# Patient Record
Sex: Male | Born: 1950
Health system: Southern US, Community
[De-identification: ages and names within clinical notes are randomized; demographics above are authoritative.]

## PROBLEM LIST (undated history)

## (undated) DIAGNOSIS — R351 Nocturia: Secondary | ICD-10-CM

## (undated) DIAGNOSIS — F419 Anxiety disorder, unspecified: Secondary | ICD-10-CM

## (undated) DIAGNOSIS — I2510 Atherosclerotic heart disease of native coronary artery without angina pectoris: Secondary | ICD-10-CM

## (undated) DIAGNOSIS — E119 Type 2 diabetes mellitus without complications: Secondary | ICD-10-CM

## (undated) DIAGNOSIS — I10 Essential (primary) hypertension: Secondary | ICD-10-CM

## (undated) DIAGNOSIS — Z951 Presence of aortocoronary bypass graft: Secondary | ICD-10-CM

## (undated) DIAGNOSIS — G4733 Obstructive sleep apnea (adult) (pediatric): Secondary | ICD-10-CM

## (undated) DIAGNOSIS — Z973 Presence of spectacles and contact lenses: Secondary | ICD-10-CM

## (undated) DIAGNOSIS — K219 Gastro-esophageal reflux disease without esophagitis: Secondary | ICD-10-CM

## (undated) DIAGNOSIS — E785 Hyperlipidemia, unspecified: Secondary | ICD-10-CM

## (undated) DIAGNOSIS — I219 Acute myocardial infarction, unspecified: Secondary | ICD-10-CM

## (undated) DIAGNOSIS — Z9989 Dependence on other enabling machines and devices: Secondary | ICD-10-CM

## (undated) DIAGNOSIS — Z8711 Personal history of peptic ulcer disease: Secondary | ICD-10-CM

## (undated) DIAGNOSIS — Z87442 Personal history of urinary calculi: Secondary | ICD-10-CM

## (undated) DIAGNOSIS — D373 Neoplasm of uncertain behavior of appendix: Secondary | ICD-10-CM

## (undated) DIAGNOSIS — N281 Cyst of kidney, acquired: Secondary | ICD-10-CM

## (undated) DIAGNOSIS — N201 Calculus of ureter: Secondary | ICD-10-CM

## (undated) DIAGNOSIS — R911 Solitary pulmonary nodule: Secondary | ICD-10-CM

## (undated) DIAGNOSIS — I252 Old myocardial infarction: Secondary | ICD-10-CM

## (undated) DIAGNOSIS — I440 Atrioventricular block, first degree: Secondary | ICD-10-CM

## (undated) DIAGNOSIS — M1990 Unspecified osteoarthritis, unspecified site: Secondary | ICD-10-CM

## (undated) DIAGNOSIS — M109 Gout, unspecified: Secondary | ICD-10-CM

## (undated) DIAGNOSIS — N289 Disorder of kidney and ureter, unspecified: Secondary | ICD-10-CM

## (undated) HISTORY — DX: Essential (primary) hypertension: I10

## (undated) HISTORY — PX: NASAL SEPTUM SURGERY: SHX37

## (undated) HISTORY — PX: TRANSTHORACIC ECHOCARDIOGRAM: SHX275

## (undated) HISTORY — DX: Type 2 diabetes mellitus without complications: E11.9

## (undated) HISTORY — PX: TONSILLECTOMY: SUR1361

## (undated) HISTORY — PX: CARDIOVASCULAR STRESS TEST: SHX262

## (undated) HISTORY — PX: ELBOW BURSA SURGERY: SHX615

## (undated) HISTORY — PX: KNEE ARTHROSCOPY: SUR90

## (undated) HISTORY — DX: Atherosclerotic heart disease of native coronary artery without angina pectoris: I25.10

## (undated) HISTORY — PX: TYMPANOSTOMY TUBE PLACEMENT: SHX32

---

## 2004-04-16 ENCOUNTER — Ambulatory Visit (HOSPITAL_COMMUNITY): Admission: RE | Admit: 2004-04-16 | Discharge: 2004-04-16 | Payer: Self-pay | Admitting: *Deleted

## 2011-08-11 ENCOUNTER — Ambulatory Visit (INDEPENDENT_AMBULATORY_CARE_PROVIDER_SITE_OTHER): Payer: Managed Care, Other (non HMO) | Admitting: Urology

## 2011-08-11 DIAGNOSIS — R3129 Other microscopic hematuria: Secondary | ICD-10-CM

## 2011-08-11 DIAGNOSIS — N529 Male erectile dysfunction, unspecified: Secondary | ICD-10-CM

## 2011-08-11 DIAGNOSIS — K4090 Unilateral inguinal hernia, without obstruction or gangrene, not specified as recurrent: Secondary | ICD-10-CM

## 2013-04-19 ENCOUNTER — Inpatient Hospital Stay (HOSPITAL_COMMUNITY)
Admission: AD | Admit: 2013-04-19 | Discharge: 2013-04-30 | DRG: 234 | Disposition: A | Discharge status: Home / Self Care | Payer: Managed Care, Other (non HMO) | Source: Other Acute Inpatient Hospital | Attending: Surgery | Admitting: Surgery

## 2013-04-19 ENCOUNTER — Encounter (HOSPITAL_COMMUNITY): Payer: Self-pay | Admitting: Physician Assistant

## 2013-04-19 DIAGNOSIS — Z87891 Personal history of nicotine dependence: Secondary | ICD-10-CM

## 2013-04-19 DIAGNOSIS — I4729 Other ventricular tachycardia: Secondary | ICD-10-CM | POA: Diagnosis not present

## 2013-04-19 DIAGNOSIS — I214 Non-ST elevation (NSTEMI) myocardial infarction: Principal | ICD-10-CM | POA: Diagnosis present

## 2013-04-19 DIAGNOSIS — I2510 Atherosclerotic heart disease of native coronary artery without angina pectoris: Secondary | ICD-10-CM

## 2013-04-19 DIAGNOSIS — J9819 Other pulmonary collapse: Secondary | ICD-10-CM | POA: Diagnosis not present

## 2013-04-19 DIAGNOSIS — Z7982 Long term (current) use of aspirin: Secondary | ICD-10-CM

## 2013-04-19 DIAGNOSIS — Z8711 Personal history of peptic ulcer disease: Secondary | ICD-10-CM

## 2013-04-19 DIAGNOSIS — E119 Type 2 diabetes mellitus without complications: Secondary | ICD-10-CM | POA: Diagnosis present

## 2013-04-19 DIAGNOSIS — I472 Ventricular tachycardia, unspecified: Secondary | ICD-10-CM | POA: Diagnosis not present

## 2013-04-19 DIAGNOSIS — D62 Acute posthemorrhagic anemia: Secondary | ICD-10-CM | POA: Diagnosis not present

## 2013-04-19 DIAGNOSIS — M109 Gout, unspecified: Secondary | ICD-10-CM | POA: Diagnosis present

## 2013-04-19 DIAGNOSIS — I498 Other specified cardiac arrhythmias: Secondary | ICD-10-CM | POA: Diagnosis present

## 2013-04-19 DIAGNOSIS — I10 Essential (primary) hypertension: Secondary | ICD-10-CM | POA: Diagnosis present

## 2013-04-19 DIAGNOSIS — Z79899 Other long term (current) drug therapy: Secondary | ICD-10-CM

## 2013-04-19 DIAGNOSIS — G4733 Obstructive sleep apnea (adult) (pediatric): Secondary | ICD-10-CM | POA: Diagnosis present

## 2013-04-19 DIAGNOSIS — Z951 Presence of aortocoronary bypass graft: Secondary | ICD-10-CM

## 2013-04-19 DIAGNOSIS — E8779 Other fluid overload: Secondary | ICD-10-CM | POA: Diagnosis not present

## 2013-04-19 DIAGNOSIS — Z23 Encounter for immunization: Secondary | ICD-10-CM

## 2013-04-19 DIAGNOSIS — K219 Gastro-esophageal reflux disease without esophagitis: Secondary | ICD-10-CM | POA: Diagnosis present

## 2013-04-19 DIAGNOSIS — E1159 Type 2 diabetes mellitus with other circulatory complications: Secondary | ICD-10-CM | POA: Diagnosis present

## 2013-04-19 DIAGNOSIS — R110 Nausea: Secondary | ICD-10-CM | POA: Diagnosis not present

## 2013-04-19 DIAGNOSIS — E785 Hyperlipidemia, unspecified: Secondary | ICD-10-CM | POA: Diagnosis present

## 2013-04-19 HISTORY — DX: Gout, unspecified: M10.9

## 2013-04-19 HISTORY — DX: Obstructive sleep apnea (adult) (pediatric): G47.33

## 2013-04-19 HISTORY — DX: Dependence on other enabling machines and devices: Z99.89

## 2013-04-19 HISTORY — DX: Gastro-esophageal reflux disease without esophagitis: K21.9

## 2013-04-19 LAB — HEPARIN LEVEL (UNFRACTIONATED): Heparin Unfractionated: 0.15 IU/mL — ABNORMAL LOW (ref 0.30–0.70)

## 2013-04-19 LAB — TROPONIN I: Troponin I: 0.34 ng/mL (ref <0.30)

## 2013-04-19 LAB — GLUCOSE, CAPILLARY
Glucose-Capillary: 187 mg/dL — ABNORMAL HIGH (ref 70–99)
Glucose-Capillary: 171 mg/dL — ABNORMAL HIGH (ref 70–99)

## 2013-04-19 MED ORDER — SODIUM CHLORIDE 0.9 % IJ SOLN
3.0000 mL | Freq: Two times a day (BID) | INTRAMUSCULAR | Status: DC
  Administered 2013-04-20 – 2013-04-24 (×5): 3 mL via INTRAVENOUS

## 2013-04-19 MED ORDER — PANTOPRAZOLE SODIUM 40 MG PO TBEC
40.0000 mg | DELAYED_RELEASE_TABLET | Freq: Every day | ORAL | Status: DC
  Administered 2013-04-19 – 2013-04-24 (×6): 40 mg via ORAL
  Filled 2013-04-19 (×6): qty 1

## 2013-04-19 MED ORDER — HEPARIN (PORCINE) IN NACL 100-0.45 UNIT/ML-% IJ SOLN
1700.0000 [IU]/h | INTRAMUSCULAR | Status: DC
  Administered 2013-04-19: 1400 [IU]/h via INTRAVENOUS
  Administered 2013-04-19 – 2013-04-20 (×2): 1700 [IU]/h via INTRAVENOUS
  Filled 2013-04-19 (×3): qty 250

## 2013-04-19 MED ORDER — ZOLPIDEM TARTRATE 5 MG PO TABS: 5.0000 mg | ORAL_TABLET | Freq: Every evening | ORAL | Status: DC | PRN

## 2013-04-19 MED ORDER — NITROGLYCERIN 0.4 MG SL SUBL: 0.4000 mg | SUBLINGUAL_TABLET | SUBLINGUAL | Status: DC | PRN

## 2013-04-19 MED ORDER — ASPIRIN EC 81 MG PO TBEC
81.0000 mg | DELAYED_RELEASE_TABLET | Freq: Every day | ORAL | Status: DC
  Administered 2013-04-19 – 2013-04-24 (×5): 81 mg via ORAL
  Filled 2013-04-19 (×7): qty 1

## 2013-04-19 MED ORDER — ACETAMINOPHEN 325 MG PO TABS
650.0000 mg | ORAL_TABLET | ORAL | Status: DC | PRN
  Administered 2013-04-22 – 2013-04-25 (×3): 650 mg via ORAL
  Filled 2013-04-19 (×4): qty 2

## 2013-04-19 MED ORDER — HEPARIN BOLUS VIA INFUSION
3000.0000 [IU] | Freq: Once | INTRAVENOUS | Status: AC
  Administered 2013-04-19: 3000 [IU] via INTRAVENOUS
  Filled 2013-04-19: qty 3000

## 2013-04-19 MED ORDER — SODIUM CHLORIDE 0.9 % IJ SOLN: 3.0000 mL | INTRAMUSCULAR | Status: DC | PRN

## 2013-04-19 MED ORDER — ADULT MULTIVITAMIN W/MINERALS CH
1.0000 | ORAL_TABLET | Freq: Every day | ORAL | Status: DC
  Administered 2013-04-19 – 2013-04-30 (×11): 1 via ORAL
  Filled 2013-04-19 (×12): qty 1

## 2013-04-19 MED ORDER — ALPRAZOLAM 0.25 MG PO TABS: 0.2500 mg | ORAL_TABLET | Freq: Two times a day (BID) | ORAL | Status: DC | PRN

## 2013-04-19 MED ORDER — ONDANSETRON HCL 4 MG/2ML IJ SOLN
4.0000 mg | Freq: Four times a day (QID) | INTRAMUSCULAR | Status: DC | PRN
  Administered 2013-04-24: 4 mg via INTRAVENOUS
  Filled 2013-04-19: qty 2

## 2013-04-19 MED ORDER — PNEUMOCOCCAL VAC POLYVALENT 25 MCG/0.5ML IJ INJ
0.5000 mL | INJECTION | INTRAMUSCULAR | Status: AC
  Filled 2013-04-19: qty 0.5

## 2013-04-19 MED ORDER — INSULIN ASPART 100 UNIT/ML ~~LOC~~ SOLN
0.0000 [IU] | Freq: Three times a day (TID) | SUBCUTANEOUS | Status: DC
  Administered 2013-04-19: 3 [IU] via SUBCUTANEOUS
  Administered 2013-04-20: 2 [IU] via SUBCUTANEOUS
  Administered 2013-04-20: 3 [IU] via SUBCUTANEOUS
  Administered 2013-04-21: 2 [IU] via SUBCUTANEOUS
  Administered 2013-04-21: 3 [IU] via SUBCUTANEOUS
  Administered 2013-04-21 – 2013-04-23 (×4): 2 [IU] via SUBCUTANEOUS
  Administered 2013-04-23: 3 [IU] via SUBCUTANEOUS
  Administered 2013-04-23: 8 [IU] via SUBCUTANEOUS
  Administered 2013-04-24: 11 [IU] via SUBCUTANEOUS
  Administered 2013-04-24: 3 [IU] via SUBCUTANEOUS
  Administered 2013-04-24: 11 [IU] via SUBCUTANEOUS

## 2013-04-19 MED ORDER — ATORVASTATIN CALCIUM 80 MG PO TABS
80.0000 mg | ORAL_TABLET | Freq: Every day | ORAL | Status: DC
  Administered 2013-04-19 – 2013-04-29 (×9): 80 mg via ORAL
  Filled 2013-04-19 (×12): qty 1

## 2013-04-19 MED ORDER — INSULIN ASPART 100 UNIT/ML ~~LOC~~ SOLN
0.0000 [IU] | Freq: Every day | SUBCUTANEOUS | Status: DC
  Administered 2013-04-23: 2 [IU] via SUBCUTANEOUS

## 2013-04-19 MED ORDER — SODIUM CHLORIDE 0.9 % IV SOLN
250.0000 mL | INTRAVENOUS | Status: DC | PRN
  Administered 2013-04-22: 250 mL via INTRAVENOUS
  Administered 2013-04-25: 11:00:00 via INTRAVENOUS

## 2013-04-19 MED FILL — Heparin Sodium (Porcine) 100 Unt/ML in Sodium Chloride 0.45%: INTRAMUSCULAR | Qty: 250 | Status: AC

## 2013-04-19 NOTE — Progress Notes (Signed)
Call placed to Dr. Chase Picket, informed of patient troponin level of 0.34, patient denies chest pain , n/v. Skin warm and dry, resp even and regular. No new order at this time.

## 2013-04-19 NOTE — H&P (Signed)
History and Physical   Patient ID: [name] [name] MRN: [id number], DOB/AGE: 03/19/1951 62 y.o. Date of Encounter: 04/19/2013  Primary Physician: Dr. Selinda Flavin Primary Cardiologist: New  Chief Complaint:  Chest pain  HPI: [name] [name] is a 62 y.o. male with no history of CAD.    He has a 3 week history exertional chest pain. It has come on with exertion or stress. The first episode was associated with nausea and dry heaves. Other episodes just with nausea. No SOB or diaphoresis. On 10/7 he had an episode that was the worst so far, reaching a 3-4/10 and associated with a lot of nausea. The pain is substernal and a pressure. It will last < 10 minutes if he stops and relaxes. Yesterday, the episode began after he had been shoveling hard for 30 minutes. The severity made him go to [place]. He was admitted there and transferred to [facility] when enzymes were elevated.   He is currently pain free. He admits to NOT following any diet for his diabetes.    Past Medical History  Diagnosis Date  . Diabetes   . HTN (hypertension)   . OSA on CPAP   . PUD (peptic ulcer disease) 1990s  . GERD (gastroesophageal reflux disease)     Surgical History:  Past Surgical History  Procedure Laterality Date  . Nasal septum surgery  > 20 yr ago     I have reviewed the patient's current medications. Prior to Admission medications   Not on File   Scheduled Meds: . atorvastatin  80 mg Oral q1800  . sodium chloride  3 mL Intravenous Q12H   Continuous Infusions: . heparin 1,400 Units/hr (04/19/13 1305)   PRN Meds:.sodium chloride, acetaminophen, ALPRAZolam, nitroGLYCERIN, ondansetron (ZOFRAN) IV, sodium chloride, zolpidem  Allergies: No Known Allergies  History   Social History  . Marital Status: Married    Spouse Name: N/A    Number of Children: N/A  . Years of Education: N/A   Occupational History  . Drives truck Runner, broadcasting/film/video   Social History Main Topics  . Smoking  status: Former Games developer  . Smokeless tobacco: Not on file  . Alcohol Use: No  . Drug Use: No  . Sexual Activity: Not on file   Other Topics Concern  . Not on file   Social History Narrative   Lives with wife, does not exercise but part of his job is strenuous. Works in a body-shop part-time, too.   Mother's sister died suddenly (?MI but no autopsy). Mother's brother died of a heart attack.    Family Status  Relation Status Death Age  . Mother Alive Born [phone]    Heart rhythm problems, no CAD  . Father Alive Born [phone]    No CAD    Review of Systems:   Full 14-point review of systems otherwise negative except as noted above.  Physical Exam: Blood pressure 137/78, pulse 51, temperature 97.9 F (36.6 C), resp. rate 18, height 6\' 1"  (1.854 m), weight 230 lb (104.327 kg), SpO2 98.00%. General: Well developed, well nourished,male in no acute distress. Head: Normocephalic, atraumatic, sclera non-icteric, no xanthomas, nares are without discharge. Dentition:  Neck: No carotid bruits. JVD not elevated. No thyromegally Lungs: Good expansion bilaterally. without wheezes or rhonchi.  Heart: Regular rate and rhythm with S1 S2.  No S3 or S4.  No murmur, no rubs, or gallops appreciated. Abdomen: Soft, non-tender, non-distended with normoactive bowel sounds. No hepatomegaly. No rebound/guarding. No obvious abdominal  masses. Msk:  Strength and tone appear normal for age. No joint deformities or effusions, no spine or costo-vertebral angle tenderness. Extremities: No clubbing or cyanosis. No edema.  Distal pedal pulses are 2+ in 4 extrem Neuro: Alert and oriented X 3. Moves all extremities spontaneously. No focal deficits noted. Psych:  Responds to questions appropriately with a normal affect. Skin: No rashes or lesions noted  Labs:  LABS - DONE AT [facility]    Date  NA  132   K+ 3.5   CL 101   CO2 23   BUN 21   CR 1.38   GLU 270       WBC 10.0   HGB 14.1   HCT 40.8   MCV 90.8   PLT 139        CK 146   CKMB 5.2   INDEX 3.5   TROP 0.06      1990s  PT/INR 12.7/1.0   PTT 30.3         HGBA1c 8.2      Radiology/Studies: AT [facility] - NAD  ECG: SBRADY 48, Q Waves III, AVF  ASSESSMENT AND PLAN:  Principal Problem:   Non-ST elevation myocardial infarction (NSTEMI), initial episode of care - Pain-free now, so continue current therapy with ASA/Heparin/statin. No BB secondary to bradycardia. Use nitrates PRN. Continue to cycle troponins and follow trend. Cath indicated, will recheck BMET and hold metformin. Cath tomorrow morning.   Will not load with Plavix since he has poorly controlled diabetes and may need CABG.  Check echo.  Otherwise, continue home Rx, add SSI, hold lisinopril. Continue PPI/CPAP. Active Problems:   Diabetes   HTN (hypertension)   OSA on CPAP   GERD (gastroesophageal reflux disease)   Signed, Theodore Demark, PA-C 04/19/2013 1:47 PM Beeper [phone]  Attending Note:   The patient was seen and examined.  Agree with assessment and plan as noted above.  Changes made to the above note as needed.  Pt is stable today.  Will set him up for cath tomorrow.    He needs to follow a better diet for his DM.    Vesta Mixer, Montez Hageman., MD, [facility] 04/19/2013, 2:19 PM

## 2013-04-19 NOTE — Plan of Care (Signed)
Problem: Food- and Nutrition-Related Knowledge Deficit (NB-1.1) Goal: Nutrition education Formal process to instruct or train a patient/client in a skill or to impart knowledge to help patients/clients voluntarily manage or modify food choices and eating behavior to maintain or improve health. Outcome: Progressing  RD consulted for nutrition education regarding diabetes.   HgBA1C: 8.2  RD provided "Carbohydrate Counting for People with Diabetes" handout from the Academy of Nutrition and Dietetics. Discussed different food groups and their effects on blood sugar, emphasizing carbohydrate-containing foods. Provided list of carbohydrates and recommended serving sizes of common foods.  Discussed importance of controlled and consistent carbohydrate intake throughout the day. Provided examples of ways to balance meals/snacks and encouraged intake of high-fiber, whole grain complex carbohydrates.  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  Pt and family describe "will power" related to love of buffets, and reliance on medication to treat blood sugars, and 2 largest barriers to diet change.  Teach back method used.  Expect good compliance.  Body mass index is 30.35 kg/(m^2). Pt meets criteria for obese based on current BMI.  Current diet order is CHO Mod Medium, patient is consuming approximately 50% of meals at this time. Labs and medications reviewed. No further nutrition interventions warranted at this time. RD contact information provided. If additional nutrition issues arise, please re-consult RD.  Loyce Dys, MS RD LDN Clinical Inpatient Dietitian Pager: [phone] Weekend/After hours pager: [phone]

## 2013-04-19 NOTE — Care Management Note (Unsigned)
    Page 1 of 1   04/24/2013     10:54:08 AM   CARE MANAGEMENT NOTE 04/24/2013  Patient:  [name] [name], [name] [name]   Account Number:  [id number]  Date Initiated:  04/19/2013  Documentation initiated by:  GRAVES-BIGELOW,Dario Yono  Subjective/Objective Assessment:   Pt admitted as a tx from [facility] for Nstemi. Plan for cath 04-20-13.     Action/Plan:   CM will continue to monitor for disposition needs.   Anticipated DC Date:  04/20/2013   Anticipated DC Plan:  HOME/SELF CARE      DC Planning Services  CM consult      Choice offered to / List presented to:             Status of service:  In process, will continue to follow Medicare Important Message given?   (If response is "NO", the following Medicare IM given date fields will be blank) Date Medicare IM given:   Date Additional Medicare IM given:    Discharge Disposition:    Per UR Regulation:  Reviewed for med. necessity/level of care/duration of stay  If discussed at Long Length of Stay Meetings, dates discussed:   04/25/2013    Comments:  04-24-13 Tomi Bamberger, RN,BSN [phone] Plan for CABG Tuesday. CM will continue to monitor for disposition needs post procedure.

## 2013-04-19 NOTE — Progress Notes (Signed)
ANTICOAGULATION CONSULT NOTE - Initial Consult  Pharmacy Consult for Heparin Indication: NSTEMI  Allergies not on file  Patient Measurements: Height: 6\' 1"  (185.4 cm) Weight: 230 lb (104.327 kg) IBW/kg (Calculated) : 79.9 Heparin Dosing Weight: 102 kg  Vital Signs: Temp: 97.9 F (36.6 C) (10/08 1214) BP: 137/78 mmHg (10/08 1214) Pulse Rate: 51 (10/08 1214)  Labs: No results found for this basename: HGB, HCT, PLT, APTT, LABPROT, INR, HEPARINUNFRC, CREATININE, CKTOTAL, CKMB, TROPONINI,  in the last 72 hours  CrCl is unknown because no creatinine reading has been taken.   Medical History: OSA (CPAP at night), HTN, NIDDM 2  Medications:  See electronic med rec  Assessment: 62 y.o. male presents from [facility] hospital with NSTEMI. Pt received heparin at [facility] 5000 unit bolus and gtt at 1400 units/hr starting at 0200. RN changing heparin bag from [facility] to ours right now - heparin off less than 10 min.  Labs at [facility] 10/7: Trop 0.06, Na 123, K 3.5, Cl 101, Bicarb 23, SCr 1.38, BUN 21, gluc 270, WBC 10, Hgb 14.1, Hct 40.8, plt 139  Goal of Therapy:  Heparin level 0.3-0.7 units/ml Monitor platelets by anticoagulation protocol: Yes   Plan:  1. Continue heparin gtt at 1400 units/hr 2. Will f/u heparin level now 3. Daily heparin level and CBC  Christoper Fabian, PharmD, BCPS Clinical pharmacist, pager [phone] 04/19/2013,12:58 PM

## 2013-04-19 NOTE — Progress Notes (Signed)
ANTICOAGULATION CONSULT NOTE - Follow-up Consult  Pharmacy Consult for Heparin Indication: NSTEMI  No Known Allergies  Patient Measurements: Height: 6\' 1"  (185.4 cm) Weight: 230 lb (104.327 kg) IBW/kg (Calculated) : 79.9 Heparin Dosing Weight: 102 kg  Vital Signs: Temp: 97.9 F (36.6 C) (10/08 1214) BP: 137/78 mmHg (10/08 1214) Pulse Rate: 51 (10/08 1214)  Labs:  Recent Labs  04/19/13 1410  HEPARINUNFRC 0.15*    CrCl is unknown because no creatinine reading has been taken.   Medical History: OSA (CPAP at night), HTN, NIDDM 2  Medications:  See electronic med rec  Assessment: 62 y.o. male presented from [facility] hospital with NSTEMI. Pt received heparin at [facility] 5000 unit bolus and gtt at 1400 units/hr starting at 0200. RN changed heparin bag from [facility] to ours 1300 - heparin off less than 10 min.  Heparin level 0.15 (subtherapeutic). No bleeding noted. Still awaiting other labs to result here.  Labs at [facility] 10/7: Trop 0.06, Na 123, K 3.5, Cl 101, Bicarb 23, SCr 1.38, BUN 21, gluc 270, WBC 10, Hgb 14.1, Hct 40.8, plt 139, INR 1  Goal of Therapy:  Heparin level 0.3-0.7 units/ml Monitor platelets by anticoagulation protocol: Yes   Plan:  1. Rebolus heparin 3000 units 2. Increase heparin gtt to 1700 units/hr 3. Will f/u heparin level in 6 hours 4. Daily heparin level and CBC  Christoper Fabian, PharmD, BCPS Clinical pharmacist, pager [phone] 04/19/2013,3:18 PM

## 2013-04-19 NOTE — Progress Notes (Signed)
UR Completed Keyasia Jolliff Graves-Bigelow, RN,BSN [phone]  

## 2013-04-20 ENCOUNTER — Other Ambulatory Visit: Payer: Self-pay

## 2013-04-20 ENCOUNTER — Encounter (HOSPITAL_COMMUNITY)
Admission: AD | Disposition: A | Discharge status: Home / Self Care | Payer: Self-pay | Source: Other Acute Inpatient Hospital | Attending: Cardiovascular Disease

## 2013-04-20 ENCOUNTER — Inpatient Hospital Stay (HOSPITAL_COMMUNITY): Payer: Managed Care, Other (non HMO)

## 2013-04-20 DIAGNOSIS — I2510 Atherosclerotic heart disease of native coronary artery without angina pectoris: Secondary | ICD-10-CM

## 2013-04-20 DIAGNOSIS — Z01810 Encounter for preprocedural cardiovascular examination: Secondary | ICD-10-CM

## 2013-04-20 DIAGNOSIS — I517 Cardiomegaly: Secondary | ICD-10-CM

## 2013-04-20 HISTORY — PX: LEFT HEART CATHETERIZATION WITH CORONARY ANGIOGRAM: SHX5451

## 2013-04-20 LAB — GLUCOSE, CAPILLARY
Glucose-Capillary: 150 mg/dL — ABNORMAL HIGH (ref 70–99)
Glucose-Capillary: 180 mg/dL — ABNORMAL HIGH (ref 70–99)
Glucose-Capillary: 134 mg/dL — ABNORMAL HIGH (ref 70–99)

## 2013-04-20 LAB — COMPREHENSIVE METABOLIC PANEL
AST: 18 U/L (ref 0–37)
Albumin: 3.5 g/dL (ref 3.5–5.2)
Alkaline Phosphatase: 79 U/L (ref 39–117)
Calcium: 9.3 mg/dL (ref 8.4–10.5)
Chloride: 102 mEq/L (ref 96–112)
GFR calc Af Amer: 73 mL/min — ABNORMAL LOW (ref >90)
Potassium: 4.1 mEq/L (ref 3.5–5.1)
Total Bilirubin: 0.9 mg/dL (ref 0.3–1.2)
Total Protein: 6.5 g/dL (ref 6.0–8.3)

## 2013-04-20 LAB — LIPID PANEL
HDL: 29 mg/dL — ABNORMAL LOW (ref >39)
LDL Cholesterol: 82 mg/dL (ref 0–99)
VLDL: 65 mg/dL — ABNORMAL HIGH (ref 0–40)

## 2013-04-20 LAB — CBC
HCT: 41 % (ref 39.0–52.0)
Platelets: 155 10*3/uL (ref 150–400)
RBC: 4.64 MIL/uL (ref 4.22–5.81)
WBC: 7.9 10*3/uL (ref 4.0–10.5)

## 2013-04-20 LAB — SURGICAL PCR SCREEN: Staphylococcus aureus: POSITIVE — AB

## 2013-04-20 LAB — TROPONIN I: Troponin I: 0.31 ng/mL (ref <0.30)

## 2013-04-20 LAB — HEPARIN LEVEL (UNFRACTIONATED): Heparin Unfractionated: 0.32 IU/mL (ref 0.30–0.70)

## 2013-04-20 SURGERY — LEFT HEART CATHETERIZATION WITH CORONARY ANGIOGRAM
Anesthesia: LOCAL

## 2013-04-20 MED ORDER — VERAPAMIL HCL 2.5 MG/ML IV SOLN
INTRAVENOUS | Status: AC
  Filled 2013-04-20: qty 2

## 2013-04-20 MED ORDER — FENTANYL CITRATE 0.05 MG/ML IJ SOLN
INTRAMUSCULAR | Status: AC
  Filled 2013-04-20: qty 2

## 2013-04-20 MED ORDER — PNEUMOCOCCAL VAC POLYVALENT 25 MCG/0.5ML IJ INJ: 0.5000 mL | INJECTION | INTRAMUSCULAR | Status: DC | PRN

## 2013-04-20 MED ORDER — SODIUM CHLORIDE 0.9 % IV SOLN: 1.0000 mL/kg/h | INTRAVENOUS | Status: DC

## 2013-04-20 MED ORDER — HEPARIN (PORCINE) IN NACL 100-0.45 UNIT/ML-% IJ SOLN
1900.0000 [IU]/h | INTRAMUSCULAR | Status: DC
  Administered 2013-04-20 – 2013-04-21 (×3): 1700 [IU]/h via INTRAVENOUS
  Administered 2013-04-22: 1900 [IU]/h via INTRAVENOUS
  Administered 2013-04-22: 1800 [IU]/h via INTRAVENOUS
  Administered 2013-04-23 – 2013-04-24 (×3): 1900 [IU]/h via INTRAVENOUS
  Filled 2013-04-20 (×13): qty 250

## 2013-04-20 MED ORDER — DIAZEPAM 5 MG PO TABS
5.0000 mg | ORAL_TABLET | ORAL | Status: AC
  Administered 2013-04-20: 5 mg via ORAL
  Filled 2013-04-20: qty 1

## 2013-04-20 MED ORDER — SODIUM CHLORIDE 0.9 % IV SOLN: 250.0000 mL | INTRAVENOUS | Status: DC | PRN

## 2013-04-20 MED ORDER — SODIUM CHLORIDE 0.9 % IV SOLN
1.0000 mL/kg/h | INTRAVENOUS | Status: DC
  Administered 2013-04-20: 1 mL/kg/h via INTRAVENOUS

## 2013-04-20 MED ORDER — ASPIRIN 81 MG PO CHEW
324.0000 mg | CHEWABLE_TABLET | ORAL | Status: DC
  Filled 2013-04-20: qty 4

## 2013-04-20 MED ORDER — LIDOCAINE HCL (PF) 1 % IJ SOLN
INTRAMUSCULAR | Status: AC
  Filled 2013-04-20: qty 30

## 2013-04-20 MED ORDER — SODIUM CHLORIDE 0.9 % IJ SOLN: 3.0000 mL | Freq: Two times a day (BID) | INTRAMUSCULAR | Status: DC

## 2013-04-20 MED ORDER — HEPARIN (PORCINE) IN NACL 2-0.9 UNIT/ML-% IJ SOLN
INTRAMUSCULAR | Status: AC
  Filled 2013-04-20: qty 1500

## 2013-04-20 MED ORDER — OXYCODONE-ACETAMINOPHEN 5-325 MG PO TABS
1.0000 | ORAL_TABLET | ORAL | Status: DC | PRN
  Administered 2013-04-23 (×2): 1 via ORAL
  Administered 2013-04-24: 2 via ORAL
  Administered 2013-04-24: 1 via ORAL
  Filled 2013-04-20: qty 2
  Filled 2013-04-20 (×3): qty 1

## 2013-04-20 MED ORDER — ASPIRIN 81 MG PO CHEW
324.0000 mg | CHEWABLE_TABLET | ORAL | Status: AC
  Administered 2013-04-20: 324 mg via ORAL

## 2013-04-20 MED ORDER — HEPARIN SODIUM (PORCINE) 1000 UNIT/ML IJ SOLN
INTRAMUSCULAR | Status: AC
  Filled 2013-04-20: qty 1

## 2013-04-20 MED ORDER — NITROGLYCERIN 0.2 MG/ML ON CALL CATH LAB
INTRAVENOUS | Status: AC
  Filled 2013-04-20: qty 1

## 2013-04-20 MED ORDER — SODIUM CHLORIDE 0.9 % IJ SOLN: 3.0000 mL | INTRAMUSCULAR | Status: DC | PRN

## 2013-04-20 MED ORDER — MIDAZOLAM HCL 2 MG/2ML IJ SOLN
INTRAMUSCULAR | Status: AC
  Filled 2013-04-20: qty 2

## 2013-04-20 NOTE — Progress Notes (Signed)
  Echocardiogram 2D Echocardiogram has been performed.  [name] [name] [name] [name] 04/20/2013, 10:08 AM

## 2013-04-20 NOTE — Consult Note (Signed)
[address]       [place] [address]             [phone]        Reason for Consult: Severe, multi-vessel coronary artery disease Referring Physician: Dr. Tonny Bollman  [name] [name] is an 62 y.o. male.  HPI:   The patient has a 3 week history of exertional chest discomfort associated with nausea and dry heaves. He had a bad episode two days ago and again yesterday after shoveling for 30 minutes prompting him to go to [place]. He ruled in for NSTEMI and was transferred to [facility]. Cath today shows severe 3-vessel disease as noted below. He has had no rest symptoms.   Past Medical History  Diagnosis Date  . Diabetes   . HTN (hypertension)   . OSA on CPAP   . PUD (peptic ulcer disease) 1990s  . GERD (gastroesophageal reflux disease)   . Anginal pain 04/18/2013  . NSTEMI (non-ST elevated myocardial infarction) 04/18/2013    Past Surgical History  Procedure Laterality Date  . Nasal septum surgery  > 20 yr ago    History reviewed. No pertinent family history.  Social History:  reports that he has never smoked. He has never used smokeless tobacco. He reports that he does not drink alcohol or use illicit drugs.  Allergies: No Known Allergies  Medications:  I have reviewed the patient's current medications. Prior to Admission:  Prescriptions prior to admission  Medication Sig Dispense Refill  . aspirin EC 81 MG tablet Take 81 mg by mouth daily.      Marland Kitchen lisinopril (PRINIVIL,ZESTRIL) 10 MG tablet Take 10 mg by mouth daily.      . metFORMIN (GLUCOPHAGE) 500 MG tablet Take 500 mg by mouth daily.      . Multiple Vitamin (MULTIVITAMIN WITH MINERALS) TABS tablet Take 1 tablet by mouth daily.      Marland Kitchen omeprazole (PRILOSEC) 20 MG capsule Take 20 mg by mouth daily.       Scheduled: . aspirin EC  81 mg Oral Daily  . atorvastatin  80 mg Oral q1800  . insulin aspart  0-15 Units Subcutaneous TID WC  . insulin aspart  0-5 Units Subcutaneous QHS  .  multivitamin with minerals  1 tablet Oral Daily  . pantoprazole  40 mg Oral Daily  . pneumococcal 23 valent vaccine  0.5 mL Intramuscular Tomorrow-1000  . sodium chloride  3 mL Intravenous Q12H   Continuous: . heparin 1,700 Units/hr (04/20/13 1513)   BJY:NWGNFA chloride, acetaminophen, ALPRAZolam, nitroGLYCERIN, ondansetron (ZOFRAN) IV, oxyCODONE-acetaminophen, [START ON 04/25/2013] pneumococcal 23 valent vaccine, sodium chloride, zolpidem  Results for orders placed during the hospital encounter of 04/19/13 (from the past 48 hour(s))  HEPARIN LEVEL (UNFRACTIONATED)     Status: Abnormal   Collection Time    04/19/13  2:10 PM      Result Value Range   Heparin Unfractionated 0.15 (*) 0.30 - 0.70 IU/mL   Comment:            IF HEPARIN RESULTS ARE BELOW     EXPECTED VALUES, AND PATIENT     DOSAGE HAS BEEN CONFIRMED,     SUGGEST FOLLOW UP TESTING     OF ANTITHROMBIN III LEVELS.  GLUCOSE, CAPILLARY     Status: Abnormal   Collection Time    04/19/13  4:28 PM      Result Value Range   Glucose-Capillary 187 (*) 70 - 99 mg/dL  TROPONIN I     Status: Abnormal   Collection Time    04/19/13  6:24 PM      Result Value Range   Troponin I 0.34 (*) <0.30 ng/mL   Comment:            Due to the release kinetics of cTnI,     a negative result within the first hours     of the onset of symptoms does not rule out     myocardial infarction with certainty.     If myocardial infarction is still suspected,     repeat the test at appropriate intervals.     REPEATED TO VERIFY     CRITICAL RESULT CALLED TO, READ BACK BY AND VERIFIED WITH:     V [facility] 2032 04/19/13 D BRADLEY  GLUCOSE, CAPILLARY     Status: Abnormal   Collection Time    04/19/13 11:11 PM      Result Value Range   Glucose-Capillary 171 (*) 70 - 99 mg/dL  HEPARIN LEVEL (UNFRACTIONATED)     Status: None   Collection Time    04/19/13 11:42 PM      Result Value Range   Heparin Unfractionated 0.51  0.30 - 0.70 IU/mL   Comment:             IF HEPARIN RESULTS ARE BELOW     EXPECTED VALUES, AND PATIENT     DOSAGE HAS BEEN CONFIRMED,     SUGGEST FOLLOW UP TESTING     OF ANTITHROMBIN III LEVELS.  TROPONIN I     Status: Abnormal   Collection Time    04/20/13  5:15 AM      Result Value Range   Troponin I 0.31 (*) <0.30 ng/mL   Comment:            Due to the release kinetics of cTnI,     a negative result within the first hours     of the onset of symptoms does not rule out     myocardial infarction with certainty.     If myocardial infarction is still suspected,     repeat the test at appropriate intervals.     REPEATED TO VERIFY     CRITICAL VALUE NOTED.  VALUE IS CONSISTENT WITH PREVIOUSLY REPORTED AND CALLED VALUE.  COMPREHENSIVE METABOLIC PANEL     Status: Abnormal   Collection Time    04/20/13  5:15 AM      Result Value Range   Sodium 137  135 - 145 mEq/L   Potassium 4.1  3.5 - 5.1 mEq/L   Chloride 102  96 - 112 mEq/L   CO2 25  19 - 32 mEq/L   Glucose, Bld 177 (*) 70 - 99 mg/dL   BUN 18  6 - 23 mg/dL   Creatinine, Ser 1.47  0.50 - 1.35 mg/dL   Calcium 9.3  8.4 - 82.9 mg/dL   Total Protein 6.5  6.0 - 8.3 g/dL   Albumin 3.5  3.5 - 5.2 g/dL   AST 18  0 - 37 U/L   ALT 24  0 - 53 U/L   Alkaline Phosphatase 79  39 - 117 U/L   Total Bilirubin 0.9  0.3 - 1.2 mg/dL   GFR calc non Af Amer 63 (*) >90 mL/min   GFR calc Af Amer 73 (*) >90 mL/min   Comment: (NOTE)     The eGFR has been calculated using the CKD EPI equation.  This calculation has not been validated in all clinical situations.     eGFR's persistently <90 mL/min signify possible Chronic Kidney     Disease.  LIPID PANEL     Status: Abnormal   Collection Time    04/20/13  5:15 AM      Result Value Range   Cholesterol 176  0 - 200 mg/dL   Triglycerides 161 (*) <150 mg/dL   HDL 29 (*) >09 mg/dL   Total CHOL/HDL Ratio 6.1     VLDL 65 (*) 0 - 40 mg/dL   LDL Cholesterol 82  0 - 99 mg/dL   Comment:            Total Cholesterol/HDL:CHD Risk      Coronary Heart Disease Risk Table                         Men   Women      1/2 Average Risk   3.4   3.3      Average Risk       5.0   4.4      2 X Average Risk   9.6   7.1      3 X Average Risk  23.4   11.0                Use the calculated Patient Ratio     above and the CHD Risk Table     to determine the patient's CHD Risk.                ATP III CLASSIFICATION (LDL):      <100     mg/dL   Optimal      604-540  mg/dL   Near or Above                        Optimal      130-159  mg/dL   Borderline      981-191  mg/dL   High      >478     mg/dL   Very High  HEPARIN LEVEL (UNFRACTIONATED)     Status: None   Collection Time    04/20/13  5:15 AM      Result Value Range   Heparin Unfractionated 0.52  0.30 - 0.70 IU/mL   Comment:            IF HEPARIN RESULTS ARE BELOW     EXPECTED VALUES, AND PATIENT     DOSAGE HAS BEEN CONFIRMED,     SUGGEST FOLLOW UP TESTING     OF ANTITHROMBIN III LEVELS.  CBC     Status: Abnormal   Collection Time    04/20/13  5:15 AM      Result Value Range   WBC 7.9  4.0 - 10.5 K/uL   RBC 4.64  4.22 - 5.81 MIL/uL   Hemoglobin 15.1  13.0 - 17.0 g/dL   HCT 29.5  62.1 - 30.8 %   MCV 88.4  78.0 - 100.0 fL   MCH 32.5  26.0 - 34.0 pg   MCHC 36.8 (*) 30.0 - 36.0 g/dL   RDW 65.7  84.6 - 96.2 %   Platelets 155  150 - 400 K/uL  GLUCOSE, CAPILLARY     Status: Abnormal   Collection Time    04/20/13  8:54 AM      Result Value Range   Glucose-Capillary 150 (*) 70 -  99 mg/dL  GLUCOSE, CAPILLARY     Status: Abnormal   Collection Time    04/20/13  1:50 PM      Result Value Range   Glucose-Capillary 180 (*) 70 - 99 mg/dL    No results found.  Review of Systems  Constitutional: Negative for fever, chills and weight loss.       Fatigue and tiredness for several months  HENT: Negative.   Eyes: Negative.   Respiratory: Negative for cough, sputum production and shortness of breath.   Cardiovascular: Positive for chest pain. Negative for orthopnea,  claudication, leg swelling and PND.  Gastrointestinal: Positive for nausea and vomiting. Negative for abdominal pain.  Genitourinary: Negative.   Musculoskeletal: Negative.   Skin: Negative.   Neurological: Negative.   Endo/Heme/Allergies: Negative.   Psychiatric/Behavioral: Negative.    Blood pressure 128/71, pulse 54, temperature 97.5 F (36.4 C), temperature source Oral, resp. rate 18, height 6\' 1"  (1.854 m), weight 103.375 kg (227 lb 14.4 oz), SpO2 100.00%. Physical Exam  Constitutional: He is oriented to person, place, and time. He appears well-developed and well-nourished. No distress.  HENT:  Head: Normocephalic and atraumatic.  Mouth/Throat: Oropharynx is clear and moist.  Eyes: EOM are normal. Pupils are equal, round, and reactive to light.  Neck: Normal range of motion. Neck supple. No JVD present. No thyromegaly present.  No bruits  Cardiovascular: Normal rate, regular rhythm, normal heart sounds and intact distal pulses.  Exam reveals no gallop and no friction rub.   No murmur heard. Respiratory: Effort normal and breath sounds normal. No respiratory distress. He has no rales.  GI: Soft. Bowel sounds are normal. He exhibits no distension and no mass. There is no tenderness.  Musculoskeletal: He exhibits no edema.  Lymphadenopathy:    He has no cervical adenopathy.  Neurological: He is alert and oriented to person, place, and time. No cranial nerve deficit.  Skin: Skin is warm and dry.  Psychiatric: He has a normal mood and affect.   Cardiac Catheterization Procedure Note  Name: TOBIE HELLEN  MRN: [id number]  DOB: 26-Nov-1950  Procedure: Left Heart Cath, Selective Coronary Angiography, LV angiography  Indication: Non-ST elevation MI. 62 year old gentleman with long-standing diabetes presents with crescendo angina and elevated cardiac enzymes. He has no personal history of CAD. He was referred for cardiac catheterization and possible PCI.  Procedural Details: The right  wrist was prepped, draped, and anesthetized with 1% lidocaine. Using the modified Seldinger technique, a 5 French sheath was introduced into the right radial artery. 3 mg of verapamil was administered through the sheath, weight-based unfractionated heparin was administered intravenously. Standard Judkins catheters were used for selective coronary angiography and left ventriculography. Catheter exchanges were performed over an exchange length guidewire. There were no immediate procedural complications. A TR band was used for radial hemostasis at the completion of the procedure. The patient was transferred to the post catheterization recovery area for further monitoring.  Procedural Findings:  Hemodynamics:  AO 122/69 with a mean of 92  LV 119/14  Coronary angiography:  Coronary dominance: right  Left mainstem: The left main is patent with no obstructive disease present. The vessel arises from the left cusp. It divides into the LAD and left circumflex.  Left anterior descending (LAD): The LAD is severely diseased. The proximal LAD has 90% stenosis involving the origin of large first diagonal branch. The mid LAD has 90% stenosis involving the origin of the medium caliber second diagonal branch. The LAD wraps around the left  ventricular apex. The first diagonal supplies a large territory. There is 80% ostial stenosis. The second diagonal supplies a medium territory and there is 50% ostial stenosis.  Left circumflex (LCx): The left circumflex has 99% proximal stenosis extending into the first obtuse marginal branch. The filling of the first OM is slightly delayed. The AV groove circumflex beyond the first OM is totally occluded and fills via right to left collaterals.  Right coronary artery (RCA): The right coronary artery is a large, dominant vessel. The mid vessel has 40-50% stenosis. The distal vessel or at the bifurcation of the posterior AV segment and PDA has 50-60% stenosis. The PDA is patent. The  posterolateral branches are small but patent.  Left ventriculography: Left ventricular systolic function is moderately impaired. There is hypokinesis of the anterolateral wall. There is mild hypokinesis of the distal inferior wall. The estimated left ventricular ejection fraction is 45%.  Final Conclusions:  1. Severe multivessel coronary artery disease with extensive diffuse disease of the proximal and mid LAD involving the first 2 diagonal branches and critical stenosis of the left circumflex extending into the obtuse marginal with total occlusion of the AV circumflex.  2. Moderate RCA stenosis  3. Moderate segmental left ventricular systolic dysfunction  Recommendations: Considering his diabetes and diffuse LAD stenosis, I think he would be best treated with coronary bypass surgery. Will consult cardiothoracic surgery.  Tonny Bollman  04/20/2013, 8:25 AM     Assessment/Plan:  He has severe multi-vessel coronary disease with recurrent exertional angina presenting with NSTEMI. He has diabetes and I agree that CABG is going to be his best option for treatment. I discussed the operative procedure with the patient and family including alternatives, benefits and risks; including but not limited to bleeding, blood transfusion, infection, stroke, myocardial infarction, graft failure, heart block requiring a permanent pacemaker, organ dysfunction, and death.  [name] understands and agrees to proceed.  We will schedule surgery for Tuesday morning 04/25/2013.  BARTLE,BRYAN K 04/20/2013, 4:28 PM

## 2013-04-20 NOTE — Progress Notes (Signed)
ANTICOAGULATION CONSULT NOTE Pharmacy Consult for Heparin Indication: NSTEMI  No Known Allergies  Patient Measurements: Height: 6\' 1"  (185.4 cm) Weight: 230 lb (104.327 kg) IBW/kg (Calculated) : 79.9 Heparin Dosing Weight: 102 kg  Vital Signs: Temp: 97.7 F (36.5 C) (10/08 2028) BP: 119/78 mmHg (10/08 2028) Pulse Rate: 57 (10/08 2028)  Labs:  Recent Labs  04/19/13 1410 04/19/13 1824 04/19/13 2342  HEPARINUNFRC 0.15*  --  0.51  TROPONINI  --  0.34*  --     CrCl is unknown because no creatinine reading has been taken.  Assessment: 62 y.o. Male with NSTEMI for heparin Goal of Therapy:  Heparin level 0.3-0.7 units/ml Monitor platelets by anticoagulation protocol: Yes   Plan:  Continue Heparin at current rate Follow-up am labs.  Geannie Risen, PharmD, BCPS   04/20/2013,12:34 AM

## 2013-04-20 NOTE — Progress Notes (Signed)
ANTICOAGULATION CONSULT NOTE Pharmacy Consult for Heparin Indication: NSTEMI; severe 3v CAD  No Known Allergies  Patient Measurements: Height: 6\' 1"  (185.4 cm) Weight: 227 lb 14.4 oz (103.375 kg) IBW/kg (Calculated) : 79.9 Heparin Dosing Weight: 102 kg  Vital Signs: Temp: 97.8 F (36.6 C) (10/09 0723) Temp src: Oral (10/09 0520) BP: 145/76 mmHg (10/09 1030) Pulse Rate: 52 (10/09 1030)  Labs:  Recent Labs  04/19/13 1410 04/19/13 1824 04/19/13 2342 04/20/13 0515  HGB  --   --   --  15.1  HCT  --   --   --  41.0  PLT  --   --   --  155  HEPARINUNFRC 0.15*  --  0.51 0.52  CREATININE  --   --   --  1.22  TROPONINI  --  0.34*  --  0.31*    Estimated Creatinine Clearance: 81.3 ml/min (by C-G formula based on Cr of 1.22).  Assessment: 62 y.o. male with NSTEMI. S/p cath 10/9 which shows multivessel CAD. CVTS consulted. Heparin to restart 4 hours post TR band removal. TR band removed ~1100 per RN. Heparin level therapeutic this a.m. on 1700 units/hr.   Goal of Therapy:  Heparin level 0.3-0.7 units/ml Monitor platelets by anticoagulation protocol: Yes   Plan:  1) Restart heparin 1700 units/hr at 1500.  2) Will f/u 6 hr heparin level post restart  Christoper Fabian, PharmD, BCPS Clinical pharmacist, pager [phone] 04/20/2013,11:22 AM

## 2013-04-20 NOTE — Interval H&P Note (Signed)
Cath Lab Visit (complete for each Cath Lab visit)  Clinical Evaluation Leading to the Procedure:   ACS: yes  Non-ACS:    Anginal Classification: CCS IV  Anti-ischemic medical therapy: No Therapy  Non-Invasive Test Results: No non-invasive testing performed  Prior CABG: No previous CABG      History and Physical Interval Note:  04/20/2013 7:41 AM  [name]  has presented today for surgery, with the diagnosis of non stemi  The various methods of treatment have been discussed with the patient and family. After consideration of risks, benefits and other options for treatment, the patient has consented to  Procedure(s): LEFT HEART CATHETERIZATION WITH CORONARY ANGIOGRAM (N/A) as a surgical intervention .  The patient's history has been reviewed, patient examined, no change in status, stable for surgery.  I have reviewed the patient's chart and labs.  Questions were answered to the patient's satisfaction.     [name] [name]

## 2013-04-20 NOTE — CV Procedure (Signed)
    Cardiac Catheterization Procedure Note  Name: [name] [name] MRN: [id number] DOB: 04/28/1951  Procedure: Left Heart Cath, Selective Coronary Angiography, LV angiography  Indication: Non-ST elevation MI. 62 year old gentleman with long-standing diabetes presents with crescendo angina and elevated cardiac enzymes. He has no personal history of CAD. He was referred for cardiac catheterization and possible PCI.   Procedural Details: The right wrist was prepped, draped, and anesthetized with 1% lidocaine. Using the modified Seldinger technique, a 5 French sheath was introduced into the right radial artery. 3 mg of verapamil was administered through the sheath, weight-based unfractionated heparin was administered intravenously. Standard Judkins catheters were used for selective coronary angiography and left ventriculography. Catheter exchanges were performed over an exchange length guidewire. There were no immediate procedural complications. A TR band was used for radial hemostasis at the completion of the procedure.  The patient was transferred to the post catheterization recovery area for further monitoring.  Procedural Findings: Hemodynamics: AO 122/69 with a mean of 92 LV 119/14  Coronary angiography: Coronary dominance: right  Left mainstem: The left main is patent with no obstructive disease present. The vessel arises from the left cusp. It divides into the LAD and left circumflex.  Left anterior descending (LAD): The LAD is severely diseased. The proximal LAD has 90% stenosis involving the origin of large first diagonal branch. The mid LAD has 90% stenosis involving the origin of the medium caliber second diagonal branch. The LAD wraps around the left ventricular apex. The first diagonal supplies a large territory. There is 80% ostial stenosis. The second diagonal supplies a medium territory and there is 50% ostial stenosis.  Left circumflex (LCx): The left circumflex has 99% proximal  stenosis extending into the first obtuse marginal branch. The filling of the first OM is slightly delayed. The AV groove circumflex beyond the first OM is totally occluded and fills via right to left collaterals.  Right coronary artery (RCA): The right coronary artery is a large, dominant vessel. The mid vessel has 40-50% stenosis. The distal vessel or at the bifurcation of the posterior AV segment and PDA has 50-60% stenosis. The PDA is patent. The posterolateral branches are small but patent.  Left ventriculography: Left ventricular systolic function is moderately impaired. There is hypokinesis of the anterolateral wall. There is mild hypokinesis of the distal inferior wall. The estimated left ventricular ejection fraction is 45%.   Final Conclusions:   1. Severe multivessel coronary artery disease with extensive diffuse disease of the proximal and mid LAD involving the first 2 diagonal branches and critical stenosis of the left circumflex extending into the obtuse marginal with total occlusion of the AV circumflex.  2. Moderate RCA stenosis  3. Moderate segmental left ventricular systolic dysfunction  Recommendations: Considering his diabetes and diffuse LAD stenosis, I think he would be best treated with coronary bypass surgery. Will consult cardiothoracic surgery.  Tonny Bollman 04/20/2013, 8:25 AM

## 2013-04-20 NOTE — Progress Notes (Addendum)
VASCULAR LAB PRELIMINARY  PRELIMINARY  PRELIMINARY  PRELIMINARY  Pre-op Cardiac Surgery  Carotid Findings:  Bilateral - Normal - No evidence of significant ICA stenosis. Vertebral artery flow is antegrade  Upper Extremity Right Left  Brachial Pressures 127 Triphasic 128 Triphasic  Radial Waveforms Triphasic Triphasic  Ulnar Waveforms Triphasic Triphasic  Palmar Arch (Allen's Test) Normal Normal   Findings:  Doppler waveforms remained normal bilaterally with both radial and ulnar compressions    Lower  Extremity Right Left  Dorsalis Pedis 108 Biphasic 172 Triphasic  Posterior Tibial 213 Triphasic 165 Biphasic  Ankle/Brachial Indices 1.66 1.34    Findings:  ABIs  Indicate early evidence of calcification right greater than left with normal arterial flow   Erika Hussar, RVS 04/20/2013, 1:12 PM

## 2013-04-20 NOTE — Progress Notes (Signed)
ANTICOAGULATION CONSULT NOTE - Follow Up Consult  Pharmacy Consult for heparin Indication: CAD awaiting CABG  Labs:  Recent Labs  04/19/13 1410 04/19/13 1824 04/19/13 2342 04/20/13 0515 04/20/13 2100  HGB  --   --   --  15.1  --   HCT  --   --   --  41.0  --   PLT  --   --   --  155  --   HEPARINUNFRC 0.15*  --  0.51 0.52 0.32  CREATININE  --   --   --  1.22  --   TROPONINI  --  0.34*  --  0.31*  --     Assessment/Plan:  62yo male therapeutic on heparin after resumed post-cath, now awaiting CABG.  Will continue gtt at current rate and confirm stable with am labs.  Vernard Gambles, PharmD, BCPS  04/20/2013,11:21 PM

## 2013-04-20 NOTE — Progress Notes (Signed)
TR band removed & right radial site level 0 with no complications.  Allen's test positive.  Will continue to monitor. [place], Mitzi Hansen

## 2013-04-21 ENCOUNTER — Other Ambulatory Visit: Payer: Self-pay

## 2013-04-21 LAB — CBC
HCT: 40.4 % (ref 39.0–52.0)
Hemoglobin: 14.5 g/dL (ref 13.0–17.0)
MCH: 31.9 pg (ref 26.0–34.0)
MCHC: 35.9 g/dL (ref 30.0–36.0)
MCV: 89 fL (ref 78.0–100.0)
Platelets: 134 10*3/uL — ABNORMAL LOW (ref 150–400)
RBC: 4.54 MIL/uL (ref 4.22–5.81)
RDW: 13.4 % (ref 11.5–15.5)
WBC: 7.6 10*3/uL (ref 4.0–10.5)

## 2013-04-21 LAB — GLUCOSE, CAPILLARY
Glucose-Capillary: 146 mg/dL — ABNORMAL HIGH (ref 70–99)
Glucose-Capillary: 158 mg/dL — ABNORMAL HIGH (ref 70–99)
Glucose-Capillary: 138 mg/dL — ABNORMAL HIGH (ref 70–99)

## 2013-04-21 NOTE — Progress Notes (Signed)
CARDIAC REHAB PHASE I   PRE:  Rate/Rhythm: 72 SR  BP:  Supine:   Sitting: 150/71  Standing:    SaO2: 100 RA  MODE:  Ambulation: 550 ft   POST:  Rate/Rhythm: 64  BP:  Supine:   Sitting: 143/76  Standing:    SaO2: 99 RA 1130-1230 Pt tolerated ambulation well without c/ol of cp or SOB. Pt did limp on right ankle, states that it feels stiff at times and hurts when walking. VS stable. Completed pre-op [facility] education with pt and daughter.  Melina Copa RN 04/21/2013 1:28 PM

## 2013-04-21 NOTE — Progress Notes (Addendum)
    Subjective:  Had a few 'twinges' of chest pain earlier, but overall doing well. No dyspnea. Walked this afternoon with cardiac rehab.   Objective:  Vital Signs in the last 24 hours: Temp:  [97.7 F (36.5 C)-98.3 F (36.8 C)] 97.7 F (36.5 C) (10/10 1426) Pulse Rate:  [55-69] 69 (10/10 1426) Resp:  [16-18] 16 (10/10 1426) BP: (108-134)/(63-78) 134/76 mmHg (10/10 1426) SpO2:  [97 %-99 %] 97 % (10/10 1426)  Intake/Output from previous day: 10/09 0701 - 10/10 0700 In: 444 [I.V.:444] Out: 1100 [Urine:1100]  Physical Exam: Pt is alert and oriented, NAD HEENT: normal Neck: JVP - normal, carotids 2+= without bruits Lungs: CTA bilaterally CV: RRR without murmur or gallop Abd: soft, NT, Positive BS, no hepatomegaly Ext: no C/C/E, distal pulses intact and equal, right radial site clear Skin: warm/dry no rash  Lab Results:  Recent Labs  04/20/13 0515 04/21/13 0625  WBC 7.9 7.6  HGB 15.1 14.5  PLT 155 134*    Recent Labs  04/20/13 0515  NA 137  K 4.1  CL 102  CO2 25  GLUCOSE 177*  BUN 18  CREATININE 1.22    Recent Labs  04/19/13 1824 04/20/13 0515  TROPONINI 0.34* 0.31*    Cardiac Studies: 2D Echo: Study Conclusions  - Left ventricle: The cavity size was normal. There was focal basal hypertrophy. Systolic function was normal. The estimated ejection fraction was in the range of 50% to 55%. Wall motion was normal; there were no regional wall motion abnormalities. - Left atrium: The atrium was mildly dilated. - Right atrium: The atrium was mildly dilated.  Tele: Sinus rhythm, sinus brady, personally reviewed.  Assessment/Plan:  1. NSTEMI - severe multivessel CAD. For CABG next week. Continue IV heparin over the weekend. No beta-blocker because of bradycardia. NTG if recurrent chest pain.   2. Type 2 DM - CBG's reviewed 134-158  3. HTN - controlled  4. Hyperlipidemia - on high-potency statin  Plan discussed with patient and family.   Tonny Bollman, M.D. 04/21/2013, 5:33 PM

## 2013-04-22 LAB — CBC
HCT: 41.1 % (ref 39.0–52.0)
MCV: 89.3 fL (ref 78.0–100.0)
RBC: 4.6 MIL/uL (ref 4.22–5.81)
RDW: 13.3 % (ref 11.5–15.5)
WBC: 8.6 10*3/uL (ref 4.0–10.5)

## 2013-04-22 LAB — HEPARIN LEVEL (UNFRACTIONATED)
Heparin Unfractionated: 0.29 IU/mL — ABNORMAL LOW (ref 0.30–0.70)
Heparin Unfractionated: 0.31 IU/mL (ref 0.30–0.70)

## 2013-04-22 LAB — GLUCOSE, CAPILLARY
Glucose-Capillary: 149 mg/dL — ABNORMAL HIGH (ref 70–99)
Glucose-Capillary: 118 mg/dL — ABNORMAL HIGH (ref 70–99)

## 2013-04-22 NOTE — Progress Notes (Signed)
Patient Name: [name] [name]      SUBJECTIVE: 62 year old gentleman admitted with chest pain with a non-STEMI. Catheterization demonstrated extensive diffuse disease in the context of his diabetes he was referred to CT surgery. LV function with 40-45%. He is scheduled for surgery next week with Dr. Laneta Simmers  No chest pain overnight. Had slept well. No shortness of breath  Past Medical History  Diagnosis Date  . Diabetes   . HTN (hypertension)   . OSA on CPAP   . PUD (peptic ulcer disease) 1990s  . GERD (gastroesophageal reflux disease)   . Anginal pain 04/18/2013  . NSTEMI (non-ST elevated myocardial infarction) 04/18/2013    Scheduled Meds:  Scheduled Meds: . aspirin EC  81 mg Oral Daily  . atorvastatin  80 mg Oral q1800  . insulin aspart  0-15 Units Subcutaneous TID WC  . insulin aspart  0-5 Units Subcutaneous QHS  . multivitamin with minerals  1 tablet Oral Daily  . pantoprazole  40 mg Oral Daily  . sodium chloride  3 mL Intravenous Q12H   Continuous Infusions: . heparin 1,800 Units/hr (04/22/13 0623)    PHYSICAL EXAM Filed Vitals:   04/21/13 0614 04/21/13 1426 04/21/13 2200 04/22/13 0627  BP: 108/63 134/76 121/68 139/80  Pulse: 61 69 55 59  Temp: 97.8 F (36.6 C) 97.7 F (36.5 C) 98 F (36.7 C) 98 F (36.7 C)  TempSrc: Oral  Oral Oral  Resp: 18 16 18 18   Height:      Weight:    231 lb (104.781 kg)  SpO2: 99% 97% 98% 96%    General appearance: alert, cooperative and no distress Neck: no carotid bruit, no JVD and supple, symmetrical, trachea midline Lungs: clear to auscultation bilaterally Heart: regular rate and rhythm, S1, S2 normal, no murmur, click, rub or gallop Abdomen: soft, non-tender; bowel sounds normal; no masses,  no organomegaly Extremities: extremities normal, atraumatic, no cyanosis or edema Skin: Skin color, texture, turgor normal. No rashes or lesions Neurologic: Alert and oriented X 3, normal strength and tone. Normal symmetric  reflexes. Normal coordination and gait  TELEMETRY: Reviewed telemetry pt in nsr:    Intake/Output Summary (Last 24 hours) at 04/22/13 0846 Last data filed at 04/22/13 0700  Gross per 24 hour  Intake   1048 ml  Output   1400 ml  Net   -352 ml    LABS: Basic Metabolic Panel:  Recent Labs Lab 04/20/13 0515  NA 137  K 4.1  CL 102  CO2 25  GLUCOSE 177*  BUN 18  CREATININE 1.22  CALCIUM 9.3   Cardiac Enzymes:  Recent Labs  04/19/13 1824 04/20/13 0515  TROPONINI 0.34* 0.31*   CBC:  Recent Labs Lab 04/20/13 0515 04/21/13 0625 04/22/13 0500  WBC 7.9 7.6 8.6  HGB 15.1 14.5 14.7  HCT 41.0 40.4 41.1  MCV 88.4 89.0 89.3  PLT 155 134* 150   PROTIME: No results found for this basename: LABPROT, INR,  in the last 72 hours Liver Function Tests:  Recent Labs  04/20/13 0515  AST 18  ALT 24  ALKPHOS 79  BILITOT 0.9  PROT 6.5  ALBUMIN 3.5     Recent Labs  04/20/13 0515  CHOL 176  HDL 29*  LDLCALC 82  TRIG 045*  CHOLHDL 6.1     ASSESSMENT AND PLAN:  Principal Problem:   Non-ST elevation myocardial infarction (NSTEMI), initial episode of care Active Problems:   Diabetes   HTN (hypertension)   OSA on CPAP  GERD (gastroesophageal reflux disease)  With anticipated bypass on Tuesday. We'll continue supportive medication.  Signed, Sherryl Manges MD  04/22/2013

## 2013-04-22 NOTE — Progress Notes (Signed)
ANTICOAGULATION CONSULT NOTE - Follow Up Consult  Pharmacy Consult for heparin Indication: CAD awaiting CABG  Labs:  Recent Labs  04/19/13 1410 04/19/13 1824  04/20/13 0515  04/21/13 0625 04/22/13 0500 04/22/13 1250  HGB  --   --   < > 15.1  --  14.5 14.7  --   HCT  --   --   --  41.0  --  40.4 41.1  --   PLT  --   --   --  155  --  134* 150  --   HEPARINUNFRC 0.15*  --   < > 0.52  < > 0.41 0.29* 0.31  CREATININE  --   --   --  1.22  --   --   --   --   TROPONINI  --  0.34*  --  0.31*  --   --   --   --   < > = values in this interval not displayed.   Assessment: 62yo male on heparin for CAD. CABG planned for Tuesday. Heparin level now at lower end of goal range on heparin after previous subtherapeutic level.   Goal of Therapy:  Heparin level 0.3-0.7 units/ml   Plan:  Will increase heparin gtt by 1 unit/kg/hr to 1900 units/hr and monitor daily AM levels.  Vinnie Level, PharmD.  [place] License #[id number] Application for [place] reciprocity pending  Clinical Pharmacist Pager [phone]  I agree with above.   Christoper Fabian, PharmD, BCPS Clinical pharmacist, pager [phone] 04/22/2013   2:22 PM

## 2013-04-22 NOTE — Progress Notes (Signed)
CARDIAC REHAB PHASE I   PRE:  Rate/Rhythm: 62 sinus rhythm  BP:  Supine:   Sitting: 120/80  Standing:    SaO2: 96% ra  MODE:  Ambulation: 350 ft   POST:  Rate/Rhythem: 71 sinus rhythm  BP:  Supine:   Sitting: 140/84  Standing:    SaO2: 96% ra 1143-1205 Pt ambulated in hallway x 1 assist. Pt c/o right ankle soreness.  No swelling, redness present. Pt had slight limb associated with this. Denies chest pain, dyspnea with activity.  Pt and family states interest in watching preparing for surgery video, instructions reinforced.  Pt verbalized understanding of pre op teaching previously performed  Rion, Wynonia Musty

## 2013-04-22 NOTE — Progress Notes (Signed)
ANTICOAGULATION CONSULT NOTE - Follow Up Consult  Pharmacy Consult for heparin Indication: CAD awaiting CABG  Labs:  Recent Labs  04/19/13 1410 04/19/13 1824  04/20/13 0515 04/20/13 2100 04/21/13 0625 04/22/13 0500  HGB  --   --   < > 15.1  --  14.5 14.7  HCT  --   --   --  41.0  --  40.4 41.1  PLT  --   --   --  155  --  134* 150  HEPARINUNFRC 0.15*  --   < > 0.52 0.32 0.41 0.29*  CREATININE  --   --   --  1.22  --   --   --   TROPONINI  --  0.34*  --  0.31*  --   --   --   < > = values in this interval not displayed.   Assessment: 62yo male now slightly subtherapeutic on heparin after one level at goal after resuming at rate that was previously therapeutic.  Goal of Therapy:  Heparin level 0.3-0.7 units/ml   Plan:  Will increase heparin gtt by 1 unit/kg/hr to 1800 units/hr and check level in 6hr.  Vernard Gambles, PharmD, BCPS  04/22/2013,6:20 AM

## 2013-04-23 ENCOUNTER — Encounter (HOSPITAL_COMMUNITY): Payer: Self-pay | Admitting: Internal Medicine

## 2013-04-23 DIAGNOSIS — M109 Gout, unspecified: Secondary | ICD-10-CM | POA: Diagnosis present

## 2013-04-23 LAB — CBC
HCT: 41.3 % (ref 39.0–52.0)
Hemoglobin: 15.3 g/dL (ref 13.0–17.0)
MCH: 32.6 pg (ref 26.0–34.0)
MCHC: 37 g/dL — ABNORMAL HIGH (ref 30.0–36.0)
MCV: 87.9 fL (ref 78.0–100.0)
Platelets: 153 10*3/uL (ref 150–400)
RBC: 4.7 MIL/uL (ref 4.22–5.81)

## 2013-04-23 LAB — GLUCOSE, CAPILLARY: Glucose-Capillary: 139 mg/dL — ABNORMAL HIGH (ref 70–99)

## 2013-04-23 MED ORDER — COLCHICINE 0.6 MG PO TABS
0.6000 mg | ORAL_TABLET | ORAL | Status: DC
  Filled 2013-04-23 (×5): qty 1

## 2013-04-23 MED ORDER — COLCHICINE 0.6 MG PO TABS
0.6000 mg | ORAL_TABLET | Freq: Every day | ORAL | Status: DC
  Administered 2013-04-24 – 2013-04-29 (×5): 0.6 mg via ORAL
  Filled 2013-04-23 (×7): qty 1

## 2013-04-23 MED ORDER — COLCHICINE 0.6 MG PO TABS
0.6000 mg | ORAL_TABLET | Freq: Once | ORAL | Status: AC
  Administered 2013-04-23: 0.6 mg via ORAL
  Filled 2013-04-23: qty 1

## 2013-04-23 MED ORDER — COLCHICINE 0.6 MG PO TABS: 0.6000 mg | ORAL_TABLET | Freq: Every day | ORAL | Status: DC

## 2013-04-23 MED ORDER — PNEUMOCOCCAL 13-VAL CONJ VACC IM SUSP: 0.5000 mL | INTRAMUSCULAR | Status: DC

## 2013-04-23 MED ORDER — PNEUMOCOCCAL VAC POLYVALENT 25 MCG/0.5ML IJ INJ: 0.5000 mL | INJECTION | INTRAMUSCULAR | Status: DC

## 2013-04-23 MED ORDER — COLCHICINE 0.6 MG PO TABS
1.2000 mg | ORAL_TABLET | Freq: Once | ORAL | Status: AC
  Administered 2013-04-23: 1.2 mg via ORAL
  Filled 2013-04-23: qty 2

## 2013-04-23 NOTE — Progress Notes (Signed)
Patient Name: [name] [name]      SUBJECTIVE: 62 year old gentleman admitted with chest pain with a non-STEMI. Catheterization demonstrated extensive diffuse disease in the context of his diabetes he was referred to CT surgery. LV function with 40-45%. He is scheduled for surgery next week with Dr. Laneta Simmers  No chest pain  Significant pain in R ankle, difficult to ambulate History of gout  Felt a little like this, treated in the past with colchcine     Past Medical History  Diagnosis Date  . Diabetes   . HTN (hypertension)   . OSA on CPAP   . PUD (peptic ulcer disease) 1990s  . GERD (gastroesophageal reflux disease)   . Anginal pain 04/18/2013  . NSTEMI (non-ST elevated myocardial infarction) 04/18/2013    Cath >>3Vdisease  . Gout     Scheduled Meds:  Scheduled Meds: . aspirin EC  81 mg Oral Daily  . atorvastatin  80 mg Oral q1800  . insulin aspart  0-15 Units Subcutaneous TID WC  . insulin aspart  0-5 Units Subcutaneous QHS  . multivitamin with minerals  1 tablet Oral Daily  . pantoprazole  40 mg Oral Daily  . sodium chloride  3 mL Intravenous Q12H   Continuous Infusions: . heparin 1,900 Units/hr (04/23/13 0345)    PHYSICAL EXAM Filed Vitals:   04/22/13 0627 04/22/13 1452 04/22/13 2100 04/23/13 0500  BP: 139/80 135/71 132/73 122/56  Pulse: 59 63 64 60  Temp: 98 F (36.7 C) 97.6 F (36.4 C) 98.9 F (37.2 C) 98.3 F (36.8 C)  TempSrc: Oral Oral    Resp: 18 17 16 18   Height:      Weight: 231 lb (104.781 kg)   227 lb 8 oz (103.193 kg)  SpO2: 96% 95% 97% 100%    General appearance: alert, cooperative and no distress Neck: no carotid bruit, no JVD and supple, symmetrical, trachea midline Lungs: clear to auscultation bilaterally Heart: regular rate and rhythm, S1, S2 normal, no murmur, click, rub or gallop Abdomen: soft, non-tender; bowel sounds normal; no masses,  no organomegaly Extremities: extremities atraumatic, no cyanosis or edema; swelling of and  warmth of R ankle Skin: Skin color, texture, turgor normal.  As above Neurologic: Alert and oriented X 3, normal strength and tone. Normal symmetric reflexes. Normal coordination and gait  TELEMETRY: Reviewed telemetry pt in nsr:    Intake/Output Summary (Last 24 hours) at 04/23/13 0911 Last data filed at 04/23/13 0345  Gross per 24 hour  Intake    243 ml  Output   1075 ml  Net   -832 ml    LABS: Basic Metabolic Panel:  Recent Labs Lab 04/20/13 0515  NA 137  K 4.1  CL 102  CO2 25  GLUCOSE 177*  BUN 18  CREATININE 1.22  CALCIUM 9.3   Cardiac Enzymes: No results found for this basename: CKTOTAL, CKMB, CKMBINDEX, TROPONINI,  in the last 72 hours CBC:  Recent Labs Lab 04/20/13 0515 04/21/13 0625 04/22/13 0500 04/23/13 0515  WBC 7.9 7.6 8.6 10.5  HGB 15.1 14.5 14.7 15.3  HCT 41.0 40.4 41.1 41.3  MCV 88.4 89.0 89.3 87.9  PLT 155 134* 150 153   PROTIME: No results found for this basename: LABPROT, INR,  in the last 72 hours Liver Function Tests: No results found for this basename: AST, ALT, ALKPHOS, BILITOT, PROT, ALBUMIN,  in the last 72 hours  No results found for this basename: CHOL, HDL, LDLCALC, TRIG, CHOLHDL, LDLDIRECT,  in the last  72 hours   ASSESSMENT AND PLAN:  Principal Problem:   Non-ST elevation myocardial infarction (NSTEMI), initial episode of care Active Problems:   Diabetes   HTN (hypertension)   OSA on CPAP   GERD (gastroesophageal reflux disease)   Gout  With anticipated bypass on Tuesday We'll continue supportive medicatiom\n  Begin colchicine for gout used in past  Will not use prednisone with impending surgery No BB 2/2 bradycardia   Signed, Sherryl Manges MD  04/23/2013

## 2013-04-23 NOTE — Progress Notes (Signed)
PT for OHS on Tuesday. PT OHS teaching completed. Proper use of incentive spirometry, DBE , Coughing exercise, wound care and infection prevention. OHS videos x2 seen. Proper use of  Pain medication, ambulation and wound splinting.

## 2013-04-23 NOTE — Progress Notes (Signed)
ANTICOAGULATION CONSULT NOTE - Follow Up Consult  Pharmacy Consult for Heparin Indication: CAD awaiting CABG  No Known Allergies  Patient Measurements: Height: 6\' 1"  (185.4 cm) Weight: 227 lb 8 oz (103.193 kg) IBW/kg (Calculated) : 79.9 Heparin Dosing Weight: 100 kg  Vital Signs: Temp: 98.3 F (36.8 C) (10/12 0500) BP: 122/56 mmHg (10/12 0500) Pulse Rate: 60 (10/12 0500)  Labs:  Recent Labs  04/21/13 0625 04/22/13 0500 04/22/13 1250 04/23/13 0515  HGB 14.5 14.7  --  15.3  HCT 40.4 41.1  --  41.3  PLT 134* 150  --  153  HEPARINUNFRC 0.41 0.29* 0.31 0.42    Estimated Creatinine Clearance: 81.2 ml/min (by C-G formula based on Cr of 1.22).   Medications:  -Heparin at 1900 units/hr  Assessment: 62 y/o M with CAD awaiting CABG on Tuesday. HL this AM is 0.42 (from 0.31). CBC good, Scr 1.22 from 10/9 with CrCl ~ 80, no overt bleeding noted.   Goal of Therapy:  Heparin level 0.3-0.7 units/ml Monitor platelets by anticoagulation protocol: Yes   Plan:  -Continue heparin drip at 1900 units/hr -Daily CBC/HL -Monitor for bleeding  Thank you for allowing me to take part in this patient's care,  Abran Duke, PharmD Clinical Pharmacist Phone: [phone] Pager: [phone] 04/23/2013 8:07 AM

## 2013-04-23 NOTE — Progress Notes (Signed)
PT had c/o of weakness on his R lower extremities. Pt further states that his "Right foot  feels like a toothache". Dr. Graciela Husbands updated.

## 2013-04-24 ENCOUNTER — Inpatient Hospital Stay (HOSPITAL_COMMUNITY): Payer: Managed Care, Other (non HMO)

## 2013-04-24 LAB — HEPARIN LEVEL (UNFRACTIONATED): Heparin Unfractionated: 0.55 IU/mL (ref 0.30–0.70)

## 2013-04-24 LAB — URINALYSIS, ROUTINE W REFLEX MICROSCOPIC
Bilirubin Urine: NEGATIVE
Ketones, ur: NEGATIVE mg/dL
Leukocytes, UA: NEGATIVE
Nitrite: NEGATIVE
Specific Gravity, Urine: 1.018 (ref 1.005–1.030)
Urobilinogen, UA: 0.2 mg/dL (ref 0.0–1.0)

## 2013-04-24 LAB — CBC
HCT: 41.5 % (ref 39.0–52.0)
Hemoglobin: 14.9 g/dL (ref 13.0–17.0)
MCV: 89.6 fL (ref 78.0–100.0)
Platelets: 152 10*3/uL (ref 150–400)
RBC: 4.63 MIL/uL (ref 4.22–5.81)
WBC: 9.5 10*3/uL (ref 4.0–10.5)

## 2013-04-24 LAB — TYPE AND SCREEN: Antibody Screen: NEGATIVE

## 2013-04-24 LAB — URINE MICROSCOPIC-ADD ON

## 2013-04-24 LAB — PROTIME-INR
INR: 1 (ref 0.00–1.49)
Prothrombin Time: 13 seconds (ref 11.6–15.2)

## 2013-04-24 MED ORDER — METOPROLOL TARTRATE 12.5 MG HALF TABLET
12.5000 mg | ORAL_TABLET | Freq: Once | ORAL | Status: AC
  Administered 2013-04-25: 12.5 mg via ORAL
  Filled 2013-04-24: qty 1

## 2013-04-24 MED ORDER — DIAZEPAM 5 MG PO TABS
5.0000 mg | ORAL_TABLET | Freq: Once | ORAL | Status: AC
  Administered 2013-04-25: 5 mg via ORAL
  Filled 2013-04-24: qty 1

## 2013-04-24 MED ORDER — POTASSIUM CHLORIDE 2 MEQ/ML IV SOLN
80.0000 meq | INTRAVENOUS | Status: DC
  Filled 2013-04-24: qty 40

## 2013-04-24 MED ORDER — DOPAMINE-DEXTROSE 3.2-5 MG/ML-% IV SOLN
2.0000 ug/kg/min | INTRAVENOUS | Status: DC
  Filled 2013-04-24: qty 250

## 2013-04-24 MED ORDER — PHENYLEPHRINE HCL 10 MG/ML IJ SOLN
30.0000 ug/min | INTRAVENOUS | Status: DC
  Filled 2013-04-24: qty 2

## 2013-04-24 MED ORDER — CHLORHEXIDINE GLUCONATE 4 % EX LIQD
60.0000 mL | Freq: Once | CUTANEOUS | Status: AC
  Administered 2013-04-24: 4 via TOPICAL
  Filled 2013-04-24: qty 60

## 2013-04-24 MED ORDER — NITROGLYCERIN IN D5W 200-5 MCG/ML-% IV SOLN
2.0000 ug/min | INTRAVENOUS | Status: DC
  Filled 2013-04-24: qty 250

## 2013-04-24 MED ORDER — PLASMA-LYTE 148 IV SOLN
INTRAVENOUS | Status: AC
  Administered 2013-04-25: 09:00:00
  Filled 2013-04-24: qty 2.5

## 2013-04-24 MED ORDER — MAGNESIUM SULFATE 50 % IJ SOLN
40.0000 meq | INTRAMUSCULAR | Status: DC
  Filled 2013-04-24: qty 10

## 2013-04-24 MED ORDER — DEXMEDETOMIDINE HCL IN NACL 400 MCG/100ML IV SOLN
0.1000 ug/kg/h | INTRAVENOUS | Status: AC
  Administered 2013-04-25: 0.3 ug/kg/h via INTRAVENOUS
  Filled 2013-04-24: qty 100

## 2013-04-24 MED ORDER — SODIUM CHLORIDE 0.9 % IV SOLN
INTRAVENOUS | Status: AC
  Administered 2013-04-25: 69.8 mL/h via INTRAVENOUS
  Filled 2013-04-24: qty 40

## 2013-04-24 MED ORDER — VANCOMYCIN HCL 10 G IV SOLR
1500.0000 mg | INTRAVENOUS | Status: AC
  Administered 2013-04-25: 1500 mg via INTRAVENOUS
  Filled 2013-04-24: qty 1500

## 2013-04-24 MED ORDER — SODIUM CHLORIDE 0.9 % IV SOLN
INTRAVENOUS | Status: AC
  Administered 2013-04-25: 3.2 [IU]/h via INTRAVENOUS
  Filled 2013-04-24: qty 1

## 2013-04-24 MED ORDER — CHLORHEXIDINE GLUCONATE 4 % EX LIQD
60.0000 mL | Freq: Once | CUTANEOUS | Status: AC
  Administered 2013-04-25: 4 via TOPICAL
  Filled 2013-04-24: qty 60

## 2013-04-24 MED ORDER — SODIUM CHLORIDE 0.9 % IV SOLN
INTRAVENOUS | Status: DC
  Filled 2013-04-24: qty 30

## 2013-04-24 MED ORDER — BISACODYL 5 MG PO TBEC
5.0000 mg | DELAYED_RELEASE_TABLET | Freq: Once | ORAL | Status: AC
  Administered 2013-04-24: 5 mg via ORAL
  Filled 2013-04-24: qty 1

## 2013-04-24 MED ORDER — CEFUROXIME SODIUM 1.5 G IJ SOLR
1.5000 g | INTRAMUSCULAR | Status: AC
  Administered 2013-04-25: .75 g via INTRAVENOUS
  Administered 2013-04-25: 1.5 g via INTRAVENOUS
  Filled 2013-04-24: qty 1.5

## 2013-04-24 MED ORDER — PREDNISONE 20 MG PO TABS
40.0000 mg | ORAL_TABLET | Freq: Every day | ORAL | Status: AC
  Administered 2013-04-24: 40 mg via ORAL
  Filled 2013-04-24: qty 2

## 2013-04-24 MED ORDER — EPINEPHRINE HCL 1 MG/ML IJ SOLN
0.5000 ug/min | INTRAVENOUS | Status: DC
  Filled 2013-04-24: qty 4

## 2013-04-24 MED ORDER — DEXTROSE 5 % IV SOLN
750.0000 mg | INTRAVENOUS | Status: DC
  Filled 2013-04-24: qty 750

## 2013-04-24 NOTE — Progress Notes (Signed)
Inpatient Diabetes Program Recommendations  AACE/ADA: New Consensus Statement on Inpatient Glycemic Control (2013)  Target Ranges:  Prepandial:   less than 140 mg/dL      Peak postprandial:   less than 180 mg/dL (1-2 hours)      Critically ill patients:  140 - 180 mg/dL     Results for GOHAN, COLLISTER (MRN [id number]) as of 04/24/2013 11:17  Ref. Range 04/23/2013 07:51 04/23/2013 12:17 04/23/2013 16:46 04/23/2013 21:12  Glucose-Capillary Latest Range: 70-99 mg/dL 696 (H) 295 (H) 284 (H) 209 (H)     **Noted patient for CABG tomorrow  Inpatient Diabetes Program Recommendations HgbA1C: MD- Please check current A1c to assess pre-hospitalization glucose control.   Will follow. Ambrose Finland RN, MSN, CDE Diabetes Coordinator Inpatient Diabetes Program Team Pager: [phone] (8a-10p)

## 2013-04-24 NOTE — Progress Notes (Signed)
Patient Name: [name] [name]      SUBJECTIVE: 62 year old gentleman admitted with chest pain with a non-STEMI. Catheterization demonstrated extensive diffuse disease in the context of his diabetes he was referred to CT surgery. LV function with 40-45%. He is scheduled for surgery next week with Dr. Laneta Simmers  No chest pain  Significant pain in R ankle still difficult to ambulate  Has had in this ankle before       Past Medical History  Diagnosis Date  . Diabetes   . HTN (hypertension)   . OSA on CPAP   . PUD (peptic ulcer disease) 1990s  . GERD (gastroesophageal reflux disease)   . Anginal pain 04/18/2013  . NSTEMI (non-ST elevated myocardial infarction) 04/18/2013    Cath >>3Vdisease  . Gout     Scheduled Meds:  Scheduled Meds: . aspirin EC  81 mg Oral Daily  . atorvastatin  80 mg Oral q1800  . colchicine  0.6 mg Oral Daily  . insulin aspart  0-15 Units Subcutaneous TID WC  . insulin aspart  0-5 Units Subcutaneous QHS  . multivitamin with minerals  1 tablet Oral Daily  . pantoprazole  40 mg Oral Daily  . [START ON 05/01/2013] pneumococcal 23 valent vaccine  0.5 mL Intramuscular Tomorrow-1000  . sodium chloride  3 mL Intravenous Q12H   Continuous Infusions: . heparin 1,900 Units/hr (04/23/13 1720)    PHYSICAL EXAM Filed Vitals:   04/22/13 2100 04/23/13 0500 04/23/13 2100 04/24/13 0500  BP: 132/73 122/56 135/79 115/67  Pulse: 64 60 72 63  Temp: 98.9 F (37.2 C) 98.3 F (36.8 C) 98.9 F (37.2 C) 97.9 F (36.6 C)  TempSrc:      Resp: 16 18 18 20   Height:      Weight:  227 lb 8 oz (103.193 kg)  227 lb (102.967 kg)  SpO2: 97% 100% 95% 97%    General appearance: alert, cooperative and no distress Neck: no carotid bruit, no JVD and supple, symmetrical, trachea midline Lungs: clear to auscultation bilaterally Heart: regular rate and rhythm, S1, S2 normal, no murmur, click, rub or gallop Abdomen: soft, non-tender; bowel sounds normal; no masses,  no  organomegaly Extremities: extremities atraumatic, no cyanosis or edema; swelling of and warmth of R ankle Skin: Skin color, texture, turgor normal.  As above Neurologic: Alert and oriented X 3, normal strength and tone. Normal symmetric reflexes. Normal coordination and gait  TELEMETRY: Reviewed telemetry pt in nsr:    Intake/Output Summary (Last 24 hours) at 04/24/13 0754 Last data filed at 04/24/13 0500  Gross per 24 hour  Intake      0 ml  Output    500 ml  Net   -500 ml    LABS: Basic Metabolic Panel:  Recent Labs Lab 04/20/13 0515  NA 137  K 4.1  CL 102  CO2 25  GLUCOSE 177*  BUN 18  CREATININE 1.22  CALCIUM 9.3   Cardiac Enzymes: No results found for this basename: CKTOTAL, CKMB, CKMBINDEX, TROPONINI,  in the last 72 hours CBC:  Recent Labs Lab 04/20/13 0515 04/21/13 0625 04/22/13 0500 04/23/13 0515 04/24/13 0505  WBC 7.9 7.6 8.6 10.5 9.5  HGB 15.1 14.5 14.7 15.3 14.9  HCT 41.0 40.4 41.1 41.3 41.5  MCV 88.4 89.0 89.3 87.9 89.6  PLT 155 134* 150 153 152   PROTIME: No results found for this basename: LABPROT, INR,  in the last 72 hours Liver Function Tests: No results found for this basename: AST, ALT,  ALKPHOS, BILITOT, PROT, ALBUMIN,  in the last 72 hours  No results found for this basename: CHOL, HDL, LDLCALC, TRIG, CHOLHDL, LDLDIRECT,  in the last 72 hours   ASSESSMENT AND PLAN:  Principal Problem:   Non-ST elevation myocardial infarction (NSTEMI), initial episode of care Active Problems:   Diabetes   HTN (hypertension)   OSA on CPAP   GERD (gastroesophageal reflux disease)   Gout  With anticipated bypass on Tuesday We'll continue supportive medicatiom\n  Began colchicine yday Will add prednisone today--discussed with Dr Karl Pock No BB 2/2 bradycardia   Signed, Sherryl Manges MD  04/24/2013

## 2013-04-24 NOTE — Progress Notes (Signed)
ANTICOAGULATION CONSULT NOTE - Follow Up Consult  Pharmacy Consult for Heparin Indication: CAD awaiting CABG  No Known Allergies  Patient Measurements: Height: 6\' 1"  (185.4 cm) Weight: 227 lb (102.967 kg) IBW/kg (Calculated) : 79.9 Heparin Dosing Weight: 100 kg  Vital Signs: Temp: 97.9 F (36.6 C) (10/13 0500) BP: 115/67 mmHg (10/13 0500) Pulse Rate: 63 (10/13 0500)  Labs:  Recent Labs  04/22/13 0500 04/22/13 1250 04/23/13 0515 04/24/13 0505  HGB 14.7  --  15.3 14.9  HCT 41.1  --  41.3 41.5  PLT 150  --  153 152  HEPARINUNFRC 0.29* 0.31 0.42 0.55    Estimated Creatinine Clearance: 81.1 ml/min (by C-G formula based on Cr of 1.22).   Medications:  -Heparin at 1900 units/hr  Assessment: 62 y/o M with CAD awaiting CABG on Tuesday. Anti-Xa level (0.55) is therapeutic this morning. Hgb/plts stable, no overt bleeding noted per chart   Goal of Therapy:  Heparin level 0.3-0.7 units/ml Monitor platelets by anticoagulation protocol: Yes   Plan:  -Continue heparin drip at 1900 units/hr -Daily CBC/HL -Monitor for bleeding  Thank you for allowing me to take part in this patient's care,  Bayard Hugger, PharmD, BCPS  Clinical Pharmacist  Pager: [phone]   04/24/2013 8:22 AM

## 2013-04-24 NOTE — Progress Notes (Signed)
4 Days Post-Op Procedure(s) (LRB): LEFT HEART CATHETERIZATION WITH CORONARY ANGIOGRAM (N/A) Subjective: No complaints. Had a good weekend  Objective: Vital signs in last 24 hours: Temp:  [97.7 F (36.5 C)-98.9 F (37.2 C)] 97.7 F (36.5 C) (10/13 1322) Pulse Rate:  [63-72] 65 (10/13 1322) Cardiac Rhythm:  [-] Normal sinus rhythm (10/13 0800) Resp:  [18-20] 18 (10/13 1322) BP: (115-135)/(67-79) 127/68 mmHg (10/13 1322) SpO2:  [94 %-97 %] 94 % (10/13 1322) Weight:  [102.967 kg (227 lb)] 102.967 kg (227 lb) (10/13 0500)  Hemodynamic parameters for last 24 hours:    Intake/Output from previous day: 10/12 0701 - 10/13 0700 In: -  Out: 500 [Urine:500] Intake/Output this shift: Total I/O In: 240 [P.O.:240] Out: -   General appearance: alert and cooperative Heart: regular rate and rhythm, S1, S2 normal, no murmur, click, rub or gallop Lungs: clear to auscultation bilaterally Extremities: extremities normal, atraumatic, no cyanosis or edema  Lab Results:  Recent Labs  04/23/13 0515 04/24/13 0505  WBC 10.5 9.5  HGB 15.3 14.9  HCT 41.3 41.5  PLT 153 152   BMET: No results found for this basename: NA, K, CL, CO2, GLUCOSE, BUN, CREATININE, CALCIUM,  in the last 72 hours  PT/INR:  Recent Labs  04/24/13 0750  LABPROT 13.0  INR 1.00   ABG No results found for this basename: phart, pco2, po2, hco3, tco2, acidbasedef, o2sat   CBG (last 3)   Recent Labs  04/24/13 0746 04/24/13 1155 04/24/13 1637  GLUCAP 162* 302* 301*    Assessment/Plan:  Severe multi-vessel coronary disease. Plan CABG tomorrow. I discussed the operative procedure with the patient including alternatives, benefits and risks; including but not limited to bleeding, blood transfusion, infection, stroke, myocardial infarction, graft failure, heart block requiring a permanent pacemaker, organ dysfunction, and death.  [name] understands and agrees to proceed.  Alleen Borne 04/24/2013

## 2013-04-24 NOTE — Progress Notes (Signed)
[id number] Came to walk with pt. He stated he just walked 150 ft and ankle feeling much better. Re enforced pre op regarding sternal precautions, showed pt how to get up and down without using arms. Discussed importance of mobility and IS after surgery. Pt has watched pre op video. Answered pt's questions re surgery. Will follow up after surgery. Luetta Nutting [facility] 04/24/2013 3:18 PM

## 2013-04-25 ENCOUNTER — Inpatient Hospital Stay (HOSPITAL_COMMUNITY): Payer: Managed Care, Other (non HMO) | Admitting: Certified Registered"

## 2013-04-25 ENCOUNTER — Encounter (HOSPITAL_COMMUNITY): Payer: Managed Care, Other (non HMO) | Admitting: Certified Registered"

## 2013-04-25 ENCOUNTER — Inpatient Hospital Stay (HOSPITAL_COMMUNITY): Payer: Managed Care, Other (non HMO)

## 2013-04-25 ENCOUNTER — Encounter (HOSPITAL_COMMUNITY): Payer: Self-pay | Admitting: Anesthesiology

## 2013-04-25 ENCOUNTER — Encounter (HOSPITAL_COMMUNITY)
Admission: AD | Disposition: A | Discharge status: Home / Self Care | Payer: Managed Care, Other (non HMO) | Source: Other Acute Inpatient Hospital | Attending: Cardiovascular Disease

## 2013-04-25 DIAGNOSIS — I2510 Atherosclerotic heart disease of native coronary artery without angina pectoris: Secondary | ICD-10-CM

## 2013-04-25 DIAGNOSIS — I10 Essential (primary) hypertension: Secondary | ICD-10-CM

## 2013-04-25 HISTORY — PX: INTRAOPERATIVE TRANSESOPHAGEAL ECHOCARDIOGRAM: SHX5062

## 2013-04-25 HISTORY — PX: CORONARY ARTERY BYPASS GRAFT: SHX141

## 2013-04-25 LAB — POCT I-STAT 3, ART BLOOD GAS (G3+)
Acid-base deficit: 1 mmol/L (ref 0.0–2.0)
Acid-base deficit: 1 mmol/L (ref 0.0–2.0)
Bicarbonate: 25 mEq/L — ABNORMAL HIGH (ref 20.0–24.0)
Bicarbonate: 24 mEq/L (ref 20.0–24.0)
O2 Saturation: 100 %
O2 Saturation: 92 %
Patient temperature: 36
Patient temperature: 36.9
TCO2: 27 mmol/L (ref 0–100)
TCO2: 25 mmol/L (ref 0–100)
TCO2: 26 mmol/L (ref 0–100)
pCO2 arterial: 48.9 mmHg — ABNORMAL HIGH (ref 35.0–45.0)
pCO2 arterial: 38.2 mmHg (ref 35.0–45.0)
pCO2 arterial: 46.1 mmHg — ABNORMAL HIGH (ref 35.0–45.0)
pCO2 arterial: 45 mmHg (ref 35.0–45.0)
pH, Arterial: 7.328 — ABNORMAL LOW (ref 7.350–7.450)
pH, Arterial: 7.393 (ref 7.350–7.450)
pH, Arterial: 7.343 — ABNORMAL LOW (ref 7.350–7.450)
pH, Arterial: 7.336 — ABNORMAL LOW (ref 7.350–7.450)
pO2, Arterial: 310 mmHg — ABNORMAL HIGH (ref 80.0–100.0)
pO2, Arterial: 61 mmHg — ABNORMAL LOW (ref 80.0–100.0)

## 2013-04-25 LAB — POCT I-STAT 4, (NA,K, GLUC, HGB,HCT)
Glucose, Bld: 165 mg/dL — ABNORMAL HIGH (ref 70–99)
Glucose, Bld: 152 mg/dL — ABNORMAL HIGH (ref 70–99)
Glucose, Bld: 135 mg/dL — ABNORMAL HIGH (ref 70–99)
Glucose, Bld: 159 mg/dL — ABNORMAL HIGH (ref 70–99)
Glucose, Bld: 173 mg/dL — ABNORMAL HIGH (ref 70–99)
HCT: 37 % — ABNORMAL LOW (ref 39.0–52.0)
HCT: 32 % — ABNORMAL LOW (ref 39.0–52.0)
HCT: 29 % — ABNORMAL LOW (ref 39.0–52.0)
HCT: 27 % — ABNORMAL LOW (ref 39.0–52.0)
HCT: 33 % — ABNORMAL LOW (ref 39.0–52.0)
Hemoglobin: 12.6 g/dL — ABNORMAL LOW (ref 13.0–17.0)
Hemoglobin: 9.9 g/dL — ABNORMAL LOW (ref 13.0–17.0)
Hemoglobin: 10.2 g/dL — ABNORMAL LOW (ref 13.0–17.0)
Hemoglobin: 11.2 g/dL — ABNORMAL LOW (ref 13.0–17.0)
Potassium: 4.4 mEq/L (ref 3.5–5.1)
Potassium: 5.2 mEq/L — ABNORMAL HIGH (ref 3.5–5.1)
Potassium: 4 mEq/L (ref 3.5–5.1)
Sodium: 137 mEq/L (ref 135–145)
Sodium: 132 mEq/L — ABNORMAL LOW (ref 135–145)
Sodium: 147 mEq/L — ABNORMAL HIGH (ref 135–145)
Sodium: 138 mEq/L (ref 135–145)

## 2013-04-25 LAB — GLUCOSE, CAPILLARY
Glucose-Capillary: 89 mg/dL (ref 70–99)
Glucose-Capillary: 129 mg/dL — ABNORMAL HIGH (ref 70–99)
Glucose-Capillary: 133 mg/dL — ABNORMAL HIGH (ref 70–99)
Glucose-Capillary: 144 mg/dL — ABNORMAL HIGH (ref 70–99)

## 2013-04-25 LAB — CBC
HCT: 32.2 % — ABNORMAL LOW (ref 39.0–52.0)
HCT: 34.9 % — ABNORMAL LOW (ref 39.0–52.0)
Hemoglobin: 14.1 g/dL (ref 13.0–17.0)
Hemoglobin: 11.5 g/dL — ABNORMAL LOW (ref 13.0–17.0)
MCH: 31.6 pg (ref 26.0–34.0)
MCH: 31.5 pg (ref 26.0–34.0)
MCHC: 36.2 g/dL — ABNORMAL HIGH (ref 30.0–36.0)
MCV: 88.2 fL (ref 78.0–100.0)
MCV: 88.8 fL (ref 78.0–100.0)
Platelets: 176 10*3/uL (ref 150–400)
Platelets: 155 10*3/uL (ref 150–400)
RBC: 3.65 MIL/uL — ABNORMAL LOW (ref 4.22–5.81)
RBC: 3.93 MIL/uL — ABNORMAL LOW (ref 4.22–5.81)
RDW: 13.3 % (ref 11.5–15.5)
RDW: 13.6 % (ref 11.5–15.5)
WBC: 12.6 10*3/uL — ABNORMAL HIGH (ref 4.0–10.5)
WBC: 10 10*3/uL (ref 4.0–10.5)
WBC: 11.7 10*3/uL — ABNORMAL HIGH (ref 4.0–10.5)

## 2013-04-25 LAB — BASIC METABOLIC PANEL
BUN: 19 mg/dL (ref 6–23)
CO2: 25 mEq/L (ref 19–32)
Calcium: 9.4 mg/dL (ref 8.4–10.5)
Chloride: 102 mEq/L (ref 96–112)
Creatinine, Ser: 1.2 mg/dL (ref 0.50–1.35)
GFR calc non Af Amer: 64 mL/min — ABNORMAL LOW (ref >90)
Glucose, Bld: 193 mg/dL — ABNORMAL HIGH (ref 70–99)
Sodium: 138 mEq/L (ref 135–145)

## 2013-04-25 LAB — CREATININE, SERUM
GFR calc Af Amer: 72 mL/min — ABNORMAL LOW (ref >90)
GFR calc non Af Amer: 62 mL/min — ABNORMAL LOW (ref >90)

## 2013-04-25 LAB — POCT I-STAT, CHEM 8
BUN: 18 mg/dL (ref 6–23)
Creatinine, Ser: 1.2 mg/dL (ref 0.50–1.35)
Glucose, Bld: 140 mg/dL — ABNORMAL HIGH (ref 70–99)
Potassium: 4 mEq/L (ref 3.5–5.1)
Sodium: 140 mEq/L (ref 135–145)
TCO2: 23 mmol/L (ref 0–100)

## 2013-04-25 LAB — HEMOGLOBIN AND HEMATOCRIT, BLOOD: HCT: 27.2 % — ABNORMAL LOW (ref 39.0–52.0)

## 2013-04-25 LAB — PROTIME-INR: INR: 1.23 (ref 0.00–1.49)

## 2013-04-25 LAB — BLOOD GAS, ARTERIAL
Bicarbonate: 22.9 mEq/L (ref 20.0–24.0)
Drawn by: 39899
FIO2: 0.21 %
O2 Saturation: 92.9 %
Patient temperature: 98.6
TCO2: 24.1 mmol/L (ref 0–100)
pH, Arterial: 7.401 (ref 7.350–7.450)

## 2013-04-25 LAB — HEPARIN LEVEL (UNFRACTIONATED): Heparin Unfractionated: 0.55 IU/mL (ref 0.30–0.70)

## 2013-04-25 LAB — APTT: aPTT: 36 seconds (ref 24–37)

## 2013-04-25 LAB — PLATELET COUNT: Platelets: 121 10*3/uL — ABNORMAL LOW (ref 150–400)

## 2013-04-25 LAB — MAGNESIUM: Magnesium: 2.8 mg/dL — ABNORMAL HIGH (ref 1.5–2.5)

## 2013-04-25 SURGERY — CORONARY ARTERY BYPASS GRAFTING (CABG)
Anesthesia: General | Site: Chest | Wound class: Clean

## 2013-04-25 MED ORDER — MORPHINE SULFATE 2 MG/ML IJ SOLN
1.0000 mg | INTRAMUSCULAR | Status: AC | PRN
  Administered 2013-04-25: 1 mg via INTRAVENOUS
  Filled 2013-04-25: qty 1

## 2013-04-25 MED ORDER — MAGNESIUM SULFATE 40 MG/ML IJ SOLN
4.0000 g | Freq: Once | INTRAMUSCULAR | Status: AC
  Administered 2013-04-25: 4 g via INTRAVENOUS
  Filled 2013-04-25: qty 100

## 2013-04-25 MED ORDER — MILRINONE IN DEXTROSE 20 MG/100ML IV SOLN
0.1250 ug/kg/min | INTRAVENOUS | Status: DC
  Filled 2013-04-25: qty 100

## 2013-04-25 MED ORDER — METOPROLOL TARTRATE 25 MG/10 ML ORAL SUSPENSION
12.5000 mg | Freq: Two times a day (BID) | ORAL | Status: DC
  Filled 2013-04-25 (×5): qty 5

## 2013-04-25 MED ORDER — VANCOMYCIN HCL IN DEXTROSE 1-5 GM/200ML-% IV SOLN
1000.0000 mg | Freq: Once | INTRAVENOUS | Status: AC
  Administered 2013-04-25: 1000 mg via INTRAVENOUS
  Filled 2013-04-25: qty 200

## 2013-04-25 MED ORDER — ACETAMINOPHEN 650 MG RE SUPP
650.0000 mg | Freq: Once | RECTAL | Status: AC
  Administered 2013-04-25: 650 mg via RECTAL

## 2013-04-25 MED ORDER — SODIUM CHLORIDE 0.9 % IJ SOLN
3.0000 mL | Freq: Two times a day (BID) | INTRAMUSCULAR | Status: DC
  Administered 2013-04-26 – 2013-04-27 (×3): 3 mL via INTRAVENOUS

## 2013-04-25 MED ORDER — OXYCODONE HCL 5 MG PO TABS
5.0000 mg | ORAL_TABLET | ORAL | Status: DC | PRN
  Administered 2013-04-25 – 2013-04-28 (×9): 10 mg via ORAL
  Administered 2013-04-29 (×2): 5 mg via ORAL
  Filled 2013-04-25: qty 2
  Filled 2013-04-25: qty 1
  Filled 2013-04-25 (×3): qty 2
  Filled 2013-04-25: qty 1
  Filled 2013-04-25 (×5): qty 2

## 2013-04-25 MED ORDER — HEPARIN SODIUM (PORCINE) 1000 UNIT/ML IJ SOLN
INTRAMUSCULAR | Status: DC | PRN
  Administered 2013-04-25: 45000 [IU] via INTRAVENOUS

## 2013-04-25 MED ORDER — SODIUM CHLORIDE 0.9 % IV SOLN
INTRAVENOUS | Status: DC
  Filled 2013-04-25: qty 1

## 2013-04-25 MED ORDER — DEXMEDETOMIDINE HCL IN NACL 200 MCG/50ML IV SOLN
0.1000 ug/kg/h | INTRAVENOUS | Status: DC
  Administered 2013-04-25: 0.4 ug/kg/h via INTRAVENOUS

## 2013-04-25 MED ORDER — PROTAMINE SULFATE 10 MG/ML IV SOLN
INTRAVENOUS | Status: DC | PRN
  Administered 2013-04-25 (×7): 50 mg via INTRAVENOUS

## 2013-04-25 MED ORDER — FENTANYL CITRATE 0.05 MG/ML IJ SOLN
INTRAMUSCULAR | Status: DC | PRN
  Administered 2013-04-25 (×2): 250 ug via INTRAVENOUS
  Administered 2013-04-25: 200 ug via INTRAVENOUS
  Administered 2013-04-25: 250 ug via INTRAVENOUS
  Administered 2013-04-25: 500 ug via INTRAVENOUS
  Administered 2013-04-25: 50 ug via INTRAVENOUS
  Administered 2013-04-25: 250 ug via INTRAVENOUS

## 2013-04-25 MED ORDER — METOCLOPRAMIDE HCL 5 MG/ML IJ SOLN
10.0000 mg | Freq: Four times a day (QID) | INTRAMUSCULAR | Status: AC
  Administered 2013-04-25 – 2013-04-26 (×4): 10 mg via INTRAVENOUS
  Filled 2013-04-25 (×4): qty 2

## 2013-04-25 MED ORDER — DOCUSATE SODIUM 100 MG PO CAPS
200.0000 mg | ORAL_CAPSULE | Freq: Every day | ORAL | Status: DC
  Administered 2013-04-26 – 2013-04-29 (×4): 200 mg via ORAL
  Filled 2013-04-25 (×6): qty 2

## 2013-04-25 MED ORDER — NITROGLYCERIN IN D5W 200-5 MCG/ML-% IV SOLN
INTRAVENOUS | Status: DC | PRN
  Administered 2013-04-25: 5 ug/min via INTRAVENOUS

## 2013-04-25 MED ORDER — ONDANSETRON HCL 4 MG/2ML IJ SOLN: 4.0000 mg | Freq: Four times a day (QID) | INTRAMUSCULAR | Status: DC | PRN

## 2013-04-25 MED ORDER — ACETAMINOPHEN 160 MG/5ML PO SOLN
1000.0000 mg | Freq: Four times a day (QID) | ORAL | Status: DC
  Filled 2013-04-25: qty 40

## 2013-04-25 MED ORDER — SODIUM CHLORIDE 0.9 % IV SOLN: INTRAVENOUS | Status: DC

## 2013-04-25 MED ORDER — MIDAZOLAM HCL 5 MG/5ML IJ SOLN
INTRAMUSCULAR | Status: DC | PRN
  Administered 2013-04-25: 1 mg via INTRAVENOUS
  Administered 2013-04-25: 2 mg via INTRAVENOUS
  Administered 2013-04-25: 1 mg via INTRAVENOUS
  Administered 2013-04-25: 5 mg via INTRAVENOUS
  Administered 2013-04-25: 3 mg via INTRAVENOUS
  Administered 2013-04-25: 2 mg via INTRAVENOUS

## 2013-04-25 MED ORDER — ASPIRIN 81 MG PO CHEW: 324.0000 mg | CHEWABLE_TABLET | Freq: Every day | ORAL | Status: DC

## 2013-04-25 MED ORDER — ALBUMIN HUMAN 5 % IV SOLN
250.0000 mL | INTRAVENOUS | Status: AC | PRN
  Administered 2013-04-25 (×3): 250 mL via INTRAVENOUS
  Filled 2013-04-25: qty 250

## 2013-04-25 MED ORDER — ACETAMINOPHEN 500 MG PO TABS
1000.0000 mg | ORAL_TABLET | Freq: Four times a day (QID) | ORAL | Status: DC
  Administered 2013-04-26 – 2013-04-27 (×6): 1000 mg via ORAL
  Filled 2013-04-25 (×10): qty 2

## 2013-04-25 MED ORDER — INSULIN REGULAR BOLUS VIA INFUSION
0.0000 [IU] | Freq: Three times a day (TID) | INTRAVENOUS | Status: DC
  Administered 2013-04-26 (×2): 3 [IU] via INTRAVENOUS
  Filled 2013-04-25: qty 10

## 2013-04-25 MED ORDER — MORPHINE SULFATE 2 MG/ML IJ SOLN
2.0000 mg | INTRAMUSCULAR | Status: DC | PRN
  Administered 2013-04-25: 4 mg via INTRAVENOUS
  Administered 2013-04-25: 2 mg via INTRAVENOUS
  Administered 2013-04-26: 4 mg via INTRAVENOUS
  Administered 2013-04-26: 2 mg via INTRAVENOUS
  Administered 2013-04-26: 4 mg via INTRAVENOUS
  Filled 2013-04-25: qty 2
  Filled 2013-04-25: qty 1
  Filled 2013-04-25 (×2): qty 2
  Filled 2013-04-25: qty 1

## 2013-04-25 MED ORDER — LACTATED RINGERS IV SOLN
INTRAVENOUS | Status: DC | PRN
  Administered 2013-04-25 (×2): via INTRAVENOUS

## 2013-04-25 MED ORDER — FAMOTIDINE IN NACL 20-0.9 MG/50ML-% IV SOLN
20.0000 mg | Freq: Two times a day (BID) | INTRAVENOUS | Status: AC
  Administered 2013-04-25: 20 mg via INTRAVENOUS

## 2013-04-25 MED ORDER — PROPOFOL 10 MG/ML IV BOLUS
INTRAVENOUS | Status: DC | PRN
  Administered 2013-04-25: 100 mg via INTRAVENOUS

## 2013-04-25 MED ORDER — ACETAMINOPHEN 160 MG/5ML PO SOLN: 650.0000 mg | Freq: Once | ORAL | Status: AC

## 2013-04-25 MED ORDER — METOPROLOL TARTRATE 1 MG/ML IV SOLN: 2.5000 mg | INTRAVENOUS | Status: DC | PRN

## 2013-04-25 MED ORDER — BISACODYL 10 MG RE SUPP: 10.0000 mg | Freq: Every day | RECTAL | Status: DC

## 2013-04-25 MED ORDER — SODIUM CHLORIDE 0.45 % IV SOLN
INTRAVENOUS | Status: DC
  Administered 2013-04-25: 20 mL/h via INTRAVENOUS

## 2013-04-25 MED ORDER — KETOROLAC TROMETHAMINE 15 MG/ML IJ SOLN
15.0000 mg | Freq: Four times a day (QID) | INTRAMUSCULAR | Status: DC
  Administered 2013-04-25 – 2013-04-26 (×3): 15 mg via INTRAVENOUS
  Filled 2013-04-25 (×7): qty 1

## 2013-04-25 MED ORDER — MIDAZOLAM HCL 2 MG/2ML IJ SOLN: 2.0000 mg | INTRAMUSCULAR | Status: DC | PRN

## 2013-04-25 MED ORDER — SODIUM CHLORIDE 0.9 % IV SOLN: 250.0000 mL | INTRAVENOUS | Status: DC

## 2013-04-25 MED ORDER — HEMOSTATIC AGENTS (NO CHARGE) OPTIME
TOPICAL | Status: DC | PRN
  Administered 2013-04-25: 1 via TOPICAL

## 2013-04-25 MED ORDER — GLYCOPYRROLATE 0.2 MG/ML IJ SOLN
INTRAMUSCULAR | Status: DC | PRN
  Administered 2013-04-25: 0.2 mg via INTRAVENOUS

## 2013-04-25 MED ORDER — BISACODYL 5 MG PO TBEC
10.0000 mg | DELAYED_RELEASE_TABLET | Freq: Every day | ORAL | Status: DC
  Administered 2013-04-26 – 2013-04-29 (×4): 10 mg via ORAL
  Filled 2013-04-25 (×6): qty 2

## 2013-04-25 MED ORDER — ALBUMIN HUMAN 5 % IV SOLN
INTRAVENOUS | Status: DC | PRN
  Administered 2013-04-25: 12:00:00 via INTRAVENOUS

## 2013-04-25 MED ORDER — ASPIRIN EC 325 MG PO TBEC
325.0000 mg | DELAYED_RELEASE_TABLET | Freq: Every day | ORAL | Status: DC
  Administered 2013-04-26 – 2013-04-30 (×5): 325 mg via ORAL
  Filled 2013-04-25 (×5): qty 1

## 2013-04-25 MED ORDER — THROMBIN 20000 UNITS EX SOLR
OROMUCOSAL | Status: DC | PRN
  Administered 2013-04-25: 09:00:00 via TOPICAL

## 2013-04-25 MED ORDER — METOPROLOL TARTRATE 12.5 MG HALF TABLET
12.5000 mg | ORAL_TABLET | Freq: Two times a day (BID) | ORAL | Status: DC
  Administered 2013-04-25 – 2013-04-27 (×5): 12.5 mg via ORAL
  Filled 2013-04-25 (×7): qty 1

## 2013-04-25 MED ORDER — NITROGLYCERIN IN D5W 200-5 MCG/ML-% IV SOLN: 0.0000 ug/min | INTRAVENOUS | Status: DC

## 2013-04-25 MED ORDER — PANTOPRAZOLE SODIUM 40 MG PO TBEC
40.0000 mg | DELAYED_RELEASE_TABLET | Freq: Every day | ORAL | Status: DC
  Administered 2013-04-27 – 2013-04-30 (×4): 40 mg via ORAL
  Filled 2013-04-25 (×5): qty 1

## 2013-04-25 MED ORDER — LIDOCAINE HCL (CARDIAC) 20 MG/ML IV SOLN
INTRAVENOUS | Status: DC | PRN
  Administered 2013-04-25: 100 mg via INTRAVENOUS

## 2013-04-25 MED ORDER — ROCURONIUM BROMIDE 100 MG/10ML IV SOLN
INTRAVENOUS | Status: DC | PRN
  Administered 2013-04-25: 30 mg via INTRAVENOUS
  Administered 2013-04-25: 20 mg via INTRAVENOUS
  Administered 2013-04-25: 50 mg via INTRAVENOUS
  Administered 2013-04-25: 30 mg via INTRAVENOUS
  Administered 2013-04-25 (×2): 50 mg via INTRAVENOUS
  Administered 2013-04-25: 20 mg via INTRAVENOUS

## 2013-04-25 MED ORDER — LACTATED RINGERS IV SOLN: 500.0000 mL | Freq: Once | INTRAVENOUS | Status: AC | PRN

## 2013-04-25 MED ORDER — SODIUM CHLORIDE 0.9 % IJ SOLN: 3.0000 mL | INTRAMUSCULAR | Status: DC | PRN

## 2013-04-25 MED ORDER — THROMBIN 20000 UNITS EX SOLR
CUTANEOUS | Status: AC
  Filled 2013-04-25: qty 20000

## 2013-04-25 MED ORDER — LACTATED RINGERS IV SOLN: INTRAVENOUS | Status: DC

## 2013-04-25 MED ORDER — SODIUM CHLORIDE 0.9 % IV SOLN
0.5000 g/h | Freq: Once | INTRAVENOUS | Status: DC
  Filled 2013-04-25: qty 20

## 2013-04-25 MED ORDER — POTASSIUM CHLORIDE 10 MEQ/50ML IV SOLN: 10.0000 meq | INTRAVENOUS | Status: AC

## 2013-04-25 MED ORDER — SODIUM CHLORIDE 0.9 % IV SOLN
INTRAVENOUS | Status: DC
  Administered 2013-04-25: 5.9 [IU]/h via INTRAVENOUS
  Administered 2013-04-26: 3.2 [IU]/h via INTRAVENOUS
  Filled 2013-04-25 (×2): qty 1

## 2013-04-25 MED ORDER — DEXTROSE 5 % IV SOLN
1.5000 g | Freq: Two times a day (BID) | INTRAVENOUS | Status: AC
  Administered 2013-04-25 – 2013-04-27 (×4): 1.5 g via INTRAVENOUS
  Filled 2013-04-25 (×4): qty 1.5

## 2013-04-25 MED ORDER — DEXTROSE 5 % IV SOLN
0.0000 ug/min | INTRAVENOUS | Status: DC
  Filled 2013-04-25: qty 2

## 2013-04-25 MED ORDER — ARTIFICIAL TEARS OP OINT
TOPICAL_OINTMENT | OPHTHALMIC | Status: DC | PRN
  Administered 2013-04-25: 1 via OPHTHALMIC

## 2013-04-25 SURGICAL SUPPLY — 100 items
ADH SKN CLS APL DERMABOND .7 (GAUZE/BANDAGES/DRESSINGS) ×1
ATTRACTOMAT 16X20 MAGNETIC DRP (DRAPES) ×2 IMPLANT
BAG DECANTER FOR FLEXI CONT (MISCELLANEOUS) ×2 IMPLANT
BANDAGE ELASTIC 3 VELCRO ST LF (GAUZE/BANDAGES/DRESSINGS) ×2 IMPLANT
BANDAGE ELASTIC 4 VELCRO ST LF (GAUZE/BANDAGES/DRESSINGS) ×2 IMPLANT
BANDAGE ELASTIC 6 VELCRO ST LF (GAUZE/BANDAGES/DRESSINGS) ×2 IMPLANT
BANDAGE GAUZE ELAST BULKY 4 IN (GAUZE/BANDAGES/DRESSINGS) ×2 IMPLANT
BASKET HEART (ORDER IN 25'S) (MISCELLANEOUS) ×1
BASKET HEART (ORDER IN 25S) (MISCELLANEOUS) ×1 IMPLANT
BLADE STERNUM SYSTEM 6 (BLADE) ×2 IMPLANT
BLADE SURG 11 STRL SS (BLADE) ×2 IMPLANT
CANISTER SUCTION 2500CC (MISCELLANEOUS) ×2 IMPLANT
CANNULA VENOUS LOW PROF 34X46 (CANNULA) ×2 IMPLANT
CATH ROBINSON RED A/P 18FR (CATHETERS) ×4 IMPLANT
CATH THORACIC 28FR (CATHETERS) ×2 IMPLANT
CATH THORACIC 28FR RT ANG (CATHETERS) IMPLANT
CATH THORACIC 36FR (CATHETERS) ×2 IMPLANT
CATH THORACIC 36FR RT ANG (CATHETERS) ×2 IMPLANT
CLIP FOGARTY SPRING 6M (CLIP) ×2 IMPLANT
CLIP TI MEDIUM 24 (CLIP) IMPLANT
CLIP TI WIDE RED SMALL 24 (CLIP) ×2 IMPLANT
COVER SURGICAL LIGHT HANDLE (MISCELLANEOUS) ×2 IMPLANT
CRADLE DONUT ADULT HEAD (MISCELLANEOUS) ×2 IMPLANT
DERMABOND ADVANCED (GAUZE/BANDAGES/DRESSINGS) ×1
DERMABOND ADVANCED .7 DNX12 (GAUZE/BANDAGES/DRESSINGS) ×1 IMPLANT
DRAPE CARDIOVASCULAR INCISE (DRAPES) ×2
DRAPE SLUSH/WARMER DISC (DRAPES) ×2 IMPLANT
DRAPE SRG 135X102X78XABS (DRAPES) ×1 IMPLANT
DRSG COVADERM 4X14 (GAUZE/BANDAGES/DRESSINGS) ×2 IMPLANT
ELECT CAUTERY BLADE 6.4 (BLADE) ×2 IMPLANT
ELECT REM PT RETURN 9FT ADLT (ELECTROSURGICAL) ×4
ELECTRODE REM PT RTRN 9FT ADLT (ELECTROSURGICAL) ×2 IMPLANT
GLOVE BIO SURGEON STRL SZ 6 (GLOVE) ×4 IMPLANT
GLOVE BIO SURGEON STRL SZ 6.5 (GLOVE) ×6 IMPLANT
GLOVE BIO SURGEON STRL SZ7 (GLOVE) IMPLANT
GLOVE BIO SURGEON STRL SZ7.5 (GLOVE) IMPLANT
GLOVE BIOGEL PI IND STRL 6 (GLOVE) ×2 IMPLANT
GLOVE BIOGEL PI IND STRL 6.5 (GLOVE) ×3 IMPLANT
GLOVE BIOGEL PI IND STRL 7.0 (GLOVE) ×3 IMPLANT
GLOVE BIOGEL PI INDICATOR 6 (GLOVE) ×2
GLOVE BIOGEL PI INDICATOR 6.5 (GLOVE) ×3
GLOVE BIOGEL PI INDICATOR 7.0 (GLOVE) ×3
GLOVE EUDERMIC 7 POWDERFREE (GLOVE) ×4 IMPLANT
GLOVE ORTHO TXT STRL SZ7.5 (GLOVE) IMPLANT
GOWN PREVENTION PLUS XLARGE (GOWN DISPOSABLE) ×2 IMPLANT
GOWN STRL NON-REIN LRG LVL3 (GOWN DISPOSABLE) ×8 IMPLANT
HEMOSTAT POWDER SURGIFOAM 1G (HEMOSTASIS) ×6 IMPLANT
HEMOSTAT SURGICEL 2X14 (HEMOSTASIS) ×2 IMPLANT
INSERT FOGARTY 61MM (MISCELLANEOUS) IMPLANT
INSERT FOGARTY XLG (MISCELLANEOUS) IMPLANT
KIT BASIN OR (CUSTOM PROCEDURE TRAY) ×2 IMPLANT
KIT CATH CPB BARTLE (MISCELLANEOUS) ×2 IMPLANT
KIT ROOM TURNOVER OR (KITS) ×2 IMPLANT
KIT SUCTION CATH 14FR (SUCTIONS) ×2 IMPLANT
KIT VASOVIEW W/TROCAR VH 2000 (KITS) ×2 IMPLANT
NS IRRIG 1000ML POUR BTL (IV SOLUTION) ×10 IMPLANT
PACK OPEN HEART (CUSTOM PROCEDURE TRAY) ×2 IMPLANT
PAD ARMBOARD 7.5X6 YLW CONV (MISCELLANEOUS) ×4 IMPLANT
PAD ELECT DEFIB RADIOL ZOLL (MISCELLANEOUS) ×2 IMPLANT
PENCIL BUTTON HOLSTER BLD 10FT (ELECTRODE) ×2 IMPLANT
PUNCH AORTIC ROTATE 4.0MM (MISCELLANEOUS) ×2 IMPLANT
PUNCH AORTIC ROTATE 4.5MM 8IN (MISCELLANEOUS) IMPLANT
PUNCH AORTIC ROTATE 5MM 8IN (MISCELLANEOUS) IMPLANT
SPONGE GAUZE 4X4 12PLY (GAUZE/BANDAGES/DRESSINGS) ×4 IMPLANT
SPONGE INTESTINAL PEANUT (DISPOSABLE) IMPLANT
SPONGE LAP 18X18 X RAY DECT (DISPOSABLE) IMPLANT
SPONGE LAP 4X18 X RAY DECT (DISPOSABLE) ×2 IMPLANT
SUT BONE WAX W31G (SUTURE) ×2 IMPLANT
SUT MNCRL AB 4-0 PS2 18 (SUTURE) ×2 IMPLANT
SUT PROLENE 3 0 SH DA (SUTURE) IMPLANT
SUT PROLENE 3 0 SH1 36 (SUTURE) ×2 IMPLANT
SUT PROLENE 4 0 RB 1 (SUTURE)
SUT PROLENE 4 0 SH DA (SUTURE) IMPLANT
SUT PROLENE 4-0 RB1 .5 CRCL 36 (SUTURE) IMPLANT
SUT PROLENE 5 0 C 1 36 (SUTURE) IMPLANT
SUT PROLENE 6 0 C 1 30 (SUTURE) IMPLANT
SUT PROLENE 7 0 BV 1 (SUTURE) ×4 IMPLANT
SUT PROLENE 7 0 BV1 MDA (SUTURE) ×2 IMPLANT
SUT PROLENE 8 0 BV175 6 (SUTURE) IMPLANT
SUT SILK  1 MH (SUTURE) ×1
SUT SILK 1 MH (SUTURE) ×1 IMPLANT
SUT STEEL STERNAL CCS#1 18IN (SUTURE) IMPLANT
SUT STEEL SZ 6 DBL 3X14 BALL (SUTURE) ×6 IMPLANT
SUT VIC AB 1 CTX 36 (SUTURE) ×6
SUT VIC AB 1 CTX36XBRD ANBCTR (SUTURE) ×3 IMPLANT
SUT VIC AB 2-0 CT1 27 (SUTURE)
SUT VIC AB 2-0 CT1 TAPERPNT 27 (SUTURE) IMPLANT
SUT VIC AB 2-0 CTX 27 (SUTURE) IMPLANT
SUT VIC AB 3-0 SH 27 (SUTURE) ×2
SUT VIC AB 3-0 SH 27X BRD (SUTURE) ×1 IMPLANT
SUT VIC AB 3-0 X1 27 (SUTURE) IMPLANT
SUT VICRYL 4-0 PS2 18IN ABS (SUTURE) IMPLANT
SUTURE E-PAK OPEN HEART (SUTURE) ×2 IMPLANT
SYSTEM SAHARA CHEST DRAIN ATS (WOUND CARE) ×2 IMPLANT
TOWEL OR 17X24 6PK STRL BLUE (TOWEL DISPOSABLE) ×2 IMPLANT
TOWEL OR 17X26 10 PK STRL BLUE (TOWEL DISPOSABLE) ×2 IMPLANT
TRAY FOLEY IC TEMP SENS 14FR (CATHETERS) ×2 IMPLANT
TUBING INSUFFLATION 10FT LAP (TUBING) ×2 IMPLANT
UNDERPAD 30X30 INCONTINENT (UNDERPADS AND DIAPERS) ×2 IMPLANT
WATER STERILE IRR 1000ML POUR (IV SOLUTION) ×4 IMPLANT

## 2013-04-25 NOTE — Procedures (Signed)
Extubation Procedure Note  Patient Details:   Name: KAELOB PERSKY DOB: 13-Feb-1951 MRN: [id number]   Airway Documentation:     Evaluation  O2 sats: stable throughout Complications: No apparent complications Patient did tolerate procedure well. Bilateral Breath Sounds: Clear   Yes [id number] [id number] Able to verbalize post extubation.  Newt Lukes 04/25/2013, 6:53 PM

## 2013-04-25 NOTE — OR Nursing (Signed)
1st call to SICU 

## 2013-04-25 NOTE — Transfer of Care (Signed)
Immediate Anesthesia Transfer of Care Note  Patient: [name] [name]  Procedure(s) Performed: Procedure(s) with comments: CORONARY ARTERY BYPASS GRAFTING (CABG) (N/A) - Coronary artery bypass graft times four, on pump, using left internal mammary artery and right greater saphenous vein via endovein harvest. INTRAOPERATIVE TRANSESOPHAGEAL ECHOCARDIOGRAM (N/A)  Patient Location: SICU  Anesthesia Type:General  Level of Consciousness: Patient remains intubated per anesthesia plan  Airway & Oxygen Therapy: Patient remains intubated per anesthesia plan  Post-op Assessment: Post -op Vital signs reviewed and stable  Post vital signs: stable  Complications: No apparent anesthesia complications

## 2013-04-25 NOTE — Preoperative (Signed)
Beta Blockers   Reason not to administer Beta Blockers:Not Applicable 

## 2013-04-25 NOTE — Anesthesia Procedure Notes (Addendum)
Procedure Name: Intubation Date/Time: 04/25/2013 7:40 AM Performed by: Ellin Goodie Pre-anesthesia Checklist: Patient identified, Emergency Drugs available, Suction available, Patient being monitored and Timeout performed Patient Re-evaluated:Patient Re-evaluated prior to inductionOxygen Delivery Method: Circle system utilized Preoxygenation: Pre-oxygenation with 100% oxygen Intubation Type: IV induction Ventilation: Mask ventilation without difficulty Laryngoscope Size: Mac and 3 Grade View: Grade IV Tube type: Oral Tube size: 8.0 mm Number of attempts: 2 Airway Equipment and Method: Bougie stylet Placement Confirmation: ETT inserted through vocal cords under direct vision,  positive ETCO2 and breath sounds checked- equal and bilateral Secured at: 22 cm Tube secured with: Tape Dental Injury: Teeth and Oropharynx as per pre-operative assessment  Difficulty Due To: Difficulty was anticipated, Difficult Airway- due to limited oral opening and Difficult Airway- due to anterior larynx Comments: Easy induction.  DL x 2 with MAC 3 blade with Grade IV view.  Dr. Katrinka Blazing intubated with bougie stylet.  H Weaver,CRNA    The patient was identified and consent obtained.  TO was performed, and full barrier precautions were used.  The skin was anesthetized with lidocaine.  Once the vein was located with the 22 ga. needle using ultrasound guidance , the wire was inserted into the vein.  The wire location was confirmed with ultrasound.  The insertion site was dilated and the introducer was carefully inserted and sutured in place. The PAC was checked, and floated into the PA.  Once in the PA, the catheter was secured. The patient tolerated the procedure well.  CXR was ordered for PACU. Start: 7829 End: 0659 J. Claybon Jabs, MD

## 2013-04-25 NOTE — Brief Op Note (Signed)
04/19/2013 - 04/25/2013  10:50 AM  PATIENT:  [name] [name]  62 y.o. male  PRE-OPERATIVE DIAGNOSIS:  CAD  POST-OPERATIVE DIAGNOSIS:  CAD  PROCEDURE:  INTRAOPERATIVE TRANSESOPHAGEAL ECHOCARDIOGRAM, CORONARY ARTERY BYPASS GRAFTING (CABG) x4 (LIMA to LAD, SVG to DIAGONAL 1, SVG to DIAGONAL 2, and SVG to OM) with EVH from right    SURGEON:  Surgeon(s) and Role:    * Alleen Borne, MD - Primary  PHYSICIAN ASSISTANT: Doree Fudge PA-C   ANESTHESIA:   general  EBL:  Total I/O In: 1900 [I.V.:1900] Out: 850 [Urine:850]   DRAINS: Chest Tube(s) in the Mediastinal and pleural spaces    COUNTS CORRECT :  YES  DICTATION: .Dragon Dictation  PLAN OF CARE: Admit to inpatient   PATIENT DISPOSITION:  ICU - intubated and hemodynamically stable.   Delay start of Pharmacological VTE agent (>24hrs) due to surgical blood loss or risk of bleeding: yes  PRE OP WEIGHT: 102 kg

## 2013-04-25 NOTE — Op Note (Signed)
CARDIOVASCULAR SURGERY OPERATIVE NOTE  04/25/2013  Surgeon:  Alleen Borne, MD  First Assistant: Doree Fudge, [facility]   Preoperative Diagnosis:  Severe multi-vessel coronary artery disease   Postoperative Diagnosis:  Same   Procedure:  1. Median Sternotomy 2. Extracorporeal circulation 3.   Coronary artery bypass grafting x 4   Left internal mammary graft to the LAD  SVG to OD  SVG to D2  SVG to OM 4.   Endoscopic vein harvest from the right leg   Anesthesia:  General Endotracheal   Clinical History/Surgical Indication:  The patient has a 3 week history of exertional chest discomfort associated with nausea and dry heaves. He had a bad episode two days ago and again yesterday after shoveling for 30 minutes prompting him to go to [place]. He ruled in for NSTEMI and was transferred to [facility]. Cath today shows severe 3-vessel disease. He has had no rest symptoms.   Cardiac Catheterization Procedure Note  Name: MISTER KRAHENBUHL  MRN: [id number]  DOB: 11-15-1950  Procedure: Left Heart Cath, Selective Coronary Angiography, LV angiography  Indication: Non-ST elevation MI. 62 year old gentleman with long-standing diabetes presents with crescendo angina and elevated cardiac enzymes. He has no personal history of CAD. He was referred for cardiac catheterization and possible PCI.  Procedural Details: The right wrist was prepped, draped, and anesthetized with 1% lidocaine. Using the modified Seldinger technique, a 5 French sheath was introduced into the right radial artery. 3 mg of verapamil was administered through the sheath, weight-based unfractionated heparin was administered intravenously. Standard Judkins catheters were used for selective coronary angiography and left ventriculography. Catheter exchanges were performed over an exchange length guidewire. There were no immediate  procedural complications. A TR band was used for radial hemostasis at the completion of the procedure. The patient was transferred to the post catheterization recovery area for further monitoring.  Procedural Findings:  Hemodynamics:  AO 122/69 with a mean of 92  LV 119/14  Coronary angiography:  Coronary dominance: right  Left mainstem: The left main is patent with no obstructive disease present. The vessel arises from the left cusp. It divides into the LAD and left circumflex.  Left anterior descending (LAD): The LAD is severely diseased. The proximal LAD has 90% stenosis involving the origin of large first diagonal branch. The mid LAD has 90% stenosis involving the origin of the medium caliber second diagonal branch. The LAD wraps around the left ventricular apex. The first diagonal supplies a large territory. There is 80% ostial stenosis. The second diagonal supplies a medium territory and there is 50% ostial stenosis.  Left circumflex (LCx): The left circumflex has 99% proximal stenosis extending into the first obtuse marginal branch. The filling of the first OM is slightly delayed. The AV groove circumflex beyond the first OM is totally occluded and fills via right to left collaterals.  Right coronary artery (RCA): The right coronary artery is a large, dominant vessel. The mid vessel has 40-50% stenosis.  The distal vessel or at the bifurcation of the posterior AV segment and PDA has 50-60% stenosis. The PDA is patent. The posterolateral branches are small but patent.  Left ventriculography: Left ventricular systolic function is moderately impaired. There is hypokinesis of the anterolateral wall. There is mild hypokinesis of the distal inferior wall. The estimated left ventricular ejection fraction is 45%.  Final Conclusions:  1. Severe multivessel coronary artery disease with extensive diffuse disease of the proximal and mid LAD involving the first 2 diagonal branches and critical stenosis of the  left circumflex extending into the obtuse marginal with total occlusion of the AV circumflex.  2. Moderate RCA stenosis  3. Moderate segmental left ventricular systolic dysfunction  Recommendations: Considering his diabetes and diffuse LAD stenosis, I think he would be best treated with coronary bypass surgery. Will consult cardiothoracic surgery.  Tonny Bollman  04/20/2013, 8:25 AM    He has severe multi-vessel coronary disease with recurrent exertional angina presenting with NSTEMI. He has diabetes and I agree that CABG is going to be his best option for treatment. I discussed the operative procedure with the patient and family including alternatives, benefits and risks; including but not limited to bleeding, blood transfusion, infection, stroke, myocardial infarction, graft failure, heart block requiring a permanent pacemaker, organ dysfunction, and death. [name] understands and agrees to proceed.   Preparation:  The patient was seen in the preoperative holding area and the correct patient, correct operation were confirmed with the patient after reviewing the medical record and catheterization. The consent was signed by me. Preoperative antibiotics were given. A pulmonary arterial line and radial arterial line were placed by the anesthesia team. The patient was taken back to the operating room and positioned supine on the operating room table. After being placed under general endotracheal anesthesia by the anesthesia team a foley catheter was placed. The neck, chest, abdomen, and both legs were prepped with betadine soap and solution and draped in the usual sterile manner. A surgical time-out was taken and the correct patient and operative procedure were confirmed with the nursing and anesthesia staff.   Cardiopulmonary Bypass:  A median sternotomy was performed. The pericardium was opened in the midline. Right ventricular function appeared normal. The ascending aorta was of normal  size and had no palpable plaque. There were no contraindications to aortic cannulation or cross-clamping. The patient was fully systemically heparinized and the ACT was maintained > 400 sec. The proximal aortic arch was cannulated with a 20 F aortic cannula for arterial inflow. Venous cannulation was performed via the right atrial appendage using a two-staged venous cannula. An antegrade cardioplegia/vent cannula was inserted into the mid-ascending aorta. Aortic occlusion was performed with a single cross-clamp. Systemic cooling to 32 degrees Centigrade and topical cooling of the heart with iced saline were used. Hyperkalemic antegrade cold blood cardioplegia was used to induce diastolic arrest and was then given at about 20 minute intervals throughout the period of arrest to maintain myocardial temperature at or below 10 degrees centigrade. A temperature probe was inserted into the interventricular septum and an insulating pad was placed in the pericardium.   Left internal mammary harvest:  The left side of the sternum was retracted using the Rultract retractor. The left internal mammary artery was harvested as a pedicle graft. All side branches were clipped. It was a medium-sized vessel of good quality with excellent blood flow. It was ligated distally and divided. It was sprayed with topical papaverine solution to prevent vasospasm.  Endoscopic vein harvest:  The right greater saphenous vein was harvested endoscopically through a 2 cm incision medial to the right knee. It was harvested from the upper thigh to below the knee. It was a medium-sized vein of fair quality. The wall was mildly thickened and not distensible. The side branches were all ligated with 4-0 silk ties.    Coronary arteries:  The coronary arteries were examined.   LAD:  Mild distal disease. The OD had no significant distal disease. The D2 was small but graftable proximally with no distal disease.  LCX:  The OM was a  moderate-sized graftable vessel with mild distal disease  RCA:  There was segmental plaque in the mid-portion and distally at the takeoff of the PDA. I decided not to graft the PDA since there was only moderate 50% stenosis and the vein was not ideal and I was concerned that the graft may not stay open under these circumstances.   Grafts:  1. LIMA to the LAD: 1.75 mm. It was sewn end to side using 8-0 prolene continuous suture. 2. SVG to OD:  1.6 mm. It was sewn end to side using 7-0 prolene continuous suture. 3. SVG to D2:  1.5 mm. It was sewn end to side using 7-0 prolene continuous suture. 4. SVG to OM:  1.75 mm. It was sewn end to side using 7-0 prolene continuous suture.  The proximal vein graft anastomoses were performed to the mid-ascending aorta using continuous 6-0 prolene suture. Graft markers were placed around the proximal anastomoses.   Completion:  The patient was rewarmed to 37 degrees Centigrade. The clamp was removed from the LIMA pedicle and there was rapid warming of the septum and return of ventricular fibrillation. The crossclamp was removed with a time of 94 minutes. There was spontaneous return of sinus rhythm. The distal and proximal anastomoses were checked for hemostasis. The position of the grafts was satisfactory. Two temporary epicardial pacing wires were placed on the right atrium and two on the right ventricle. The patient was weaned from CPB without difficulty on no inotropes. CPB time was 111 minutes. Cardiac output was 5 LPM. Heparin was fully reversed with protamine and the aortic and venous cannulas removed. Hemostasis was achieved. Mediastinal and left pleural drainage tubes were placed. The sternum was closed with double #6 stainless steel wires. The fascia was closed with continuous # 1 vicryl suture. The subcutaneous tissue was closed with 2-0 vicryl continuous suture. The skin was closed with 3-0 vicryl subcuticular suture. All sponge, needle, and instrument  counts were reported correct at the end of the case. Dry sterile dressings were placed over the incisions and around the chest tubes which were connected to pleurevac suction. The patient was then transported to the surgical intensive care unit in critical but stable condition.

## 2013-04-25 NOTE — Progress Notes (Signed)
CT surgery p.m. Rounds  Doing well after CABG. Extubated and breathing comfortably. Responding normally. Musculoskeletal pain not relieved with narcotic-we'll start IV Toradol-excellent urine output 6 hour postop labs reviewed and are satisfactory

## 2013-04-25 NOTE — Anesthesia Preprocedure Evaluation (Addendum)
Anesthesia Evaluation  Patient identified by MRN, date of birth, ID band Patient awake    Reviewed: Allergy & Precautions, H&P , NPO status , Patient's Chart, lab work & pertinent test results, reviewed documented beta blocker date and time   Airway Mallampati: II TM Distance: >3 FB Neck ROM: Full    Dental  (+) Dental Advisory Given and Teeth Intact   Pulmonary sleep apnea and Continuous Positive Airway Pressure Ventilation ,  breath sounds clear to auscultation        Cardiovascular hypertension, Pt. on medications + angina with exertion + CAD and + Past MI     Neuro/Psych    GI/Hepatic PUD, GERD-  ,  Endo/Other  diabetes, Well Controlled, Type 2, Oral Hypoglycemic Agents  Renal/GU      Musculoskeletal   Abdominal (+)  Abdomen: soft. Bowel sounds: normal.  Peds  Hematology   Anesthesia Other Findings NSTEMI, EF 50-55%.   Reproductive/Obstetrics                       Anesthesia Physical Anesthesia Plan  ASA: III  Anesthesia Plan: General   Post-op Pain Management:    Induction: Intravenous  Airway Management Planned: Oral ETT  Additional Equipment: Arterial line, CVP and PA Cath  Intra-op Plan:   Post-operative Plan: Post-operative intubation/ventilation  Informed Consent: I have reviewed the patients History and Physical, chart, labs and discussed the procedure including the risks, benefits and alternatives for the proposed anesthesia with the patient or authorized representative who has indicated his/her understanding and acceptance.     Plan Discussed with: CRNA, Anesthesiologist and Surgeon  Anesthesia Plan Comments:         Anesthesia Quick Evaluation

## 2013-04-25 NOTE — Anesthesia Postprocedure Evaluation (Signed)
  Anesthesia Post-op Note  Patient: [name] [name]  Procedure(s) Performed: Procedure(s) with comments: CORONARY ARTERY BYPASS GRAFTING (CABG) (N/A) - Coronary artery bypass graft times four, on pump, using left internal mammary artery and right greater saphenous vein via endovein harvest. INTRAOPERATIVE TRANSESOPHAGEAL ECHOCARDIOGRAM (N/A)  Patient Location: ICU  Anesthesia Type:General  Level of Consciousness: sedated and unresponsive  Airway and Oxygen Therapy: Patient remains intubated and on ventilator  Post-op Pain: none  Post-op Assessment: Post-op Vital signs reviewed, Patient's Cardiovascular Status Stable and Respiratory Function Stable  Post-op Vital Signs: Reviewed  Filed Vitals:   04/25/13 1300  BP: 104/67  Pulse: 90  Temp: 36 C  Resp: 14    Complications: No apparent anesthesia complications

## 2013-04-25 NOTE — OR Nursing (Signed)
2nd call to SICU 

## 2013-04-26 ENCOUNTER — Encounter (HOSPITAL_COMMUNITY): Payer: Self-pay | Admitting: Surgery

## 2013-04-26 ENCOUNTER — Inpatient Hospital Stay (HOSPITAL_COMMUNITY): Payer: Managed Care, Other (non HMO)

## 2013-04-26 LAB — CBC
HCT: 35 % — ABNORMAL LOW (ref 39.0–52.0)
Hemoglobin: 11.9 g/dL — ABNORMAL LOW (ref 13.0–17.0)
MCH: 31 pg (ref 26.0–34.0)
MCH: 30.9 pg (ref 26.0–34.0)
MCHC: 34.6 g/dL (ref 30.0–36.0)
MCHC: 34 g/dL (ref 30.0–36.0)
MCV: 90.9 fL (ref 78.0–100.0)
Platelets: 127 10*3/uL — ABNORMAL LOW (ref 150–400)
Platelets: 144 10*3/uL — ABNORMAL LOW (ref 150–400)
RBC: 3.55 MIL/uL — ABNORMAL LOW (ref 4.22–5.81)
RBC: 3.85 MIL/uL — ABNORMAL LOW (ref 4.22–5.81)
RDW: 13.7 % (ref 11.5–15.5)
RDW: 14 % (ref 11.5–15.5)
WBC: 9.6 10*3/uL (ref 4.0–10.5)

## 2013-04-26 LAB — BASIC METABOLIC PANEL
BUN: 18 mg/dL (ref 6–23)
CO2: 25 mEq/L (ref 19–32)
Calcium: 8.1 mg/dL — ABNORMAL LOW (ref 8.4–10.5)
Chloride: 108 mEq/L (ref 96–112)
Creatinine, Ser: 1.3 mg/dL (ref 0.50–1.35)
GFR calc Af Amer: 67 mL/min — ABNORMAL LOW (ref >90)
GFR calc non Af Amer: 58 mL/min — ABNORMAL LOW (ref >90)
Glucose, Bld: 96 mg/dL (ref 70–99)
Potassium: 4.5 mEq/L (ref 3.5–5.1)
Sodium: 141 mEq/L (ref 135–145)

## 2013-04-26 LAB — GLUCOSE, CAPILLARY
Glucose-Capillary: 100 mg/dL — ABNORMAL HIGH (ref 70–99)
Glucose-Capillary: 102 mg/dL — ABNORMAL HIGH (ref 70–99)
Glucose-Capillary: 109 mg/dL — ABNORMAL HIGH (ref 70–99)
Glucose-Capillary: 114 mg/dL — ABNORMAL HIGH (ref 70–99)
Glucose-Capillary: 118 mg/dL — ABNORMAL HIGH (ref 70–99)
Glucose-Capillary: 140 mg/dL — ABNORMAL HIGH (ref 70–99)
Glucose-Capillary: 149 mg/dL — ABNORMAL HIGH (ref 70–99)
Glucose-Capillary: 154 mg/dL — ABNORMAL HIGH (ref 70–99)
Glucose-Capillary: 159 mg/dL — ABNORMAL HIGH (ref 70–99)
Glucose-Capillary: 162 mg/dL — ABNORMAL HIGH (ref 70–99)
Glucose-Capillary: 93 mg/dL (ref 70–99)
Glucose-Capillary: 94 mg/dL (ref 70–99)
Glucose-Capillary: 95 mg/dL (ref 70–99)

## 2013-04-26 LAB — CREATININE, SERUM
Creatinine, Ser: 1.16 mg/dL (ref 0.50–1.35)
GFR calc Af Amer: 77 mL/min — ABNORMAL LOW (ref >90)
GFR calc non Af Amer: 67 mL/min — ABNORMAL LOW (ref >90)

## 2013-04-26 LAB — POCT I-STAT, CHEM 8
BUN: 19 mg/dL (ref 6–23)
Calcium, Ion: 1.19 mmol/L (ref 1.13–1.30)
Chloride: 102 mEq/L (ref 96–112)
Creatinine, Ser: 1.4 mg/dL — ABNORMAL HIGH (ref 0.50–1.35)
Glucose, Bld: 169 mg/dL — ABNORMAL HIGH (ref 70–99)
HCT: 35 % — ABNORMAL LOW (ref 39.0–52.0)
Potassium: 4.4 mEq/L (ref 3.5–5.1)

## 2013-04-26 LAB — MAGNESIUM
Magnesium: 2.3 mg/dL (ref 1.5–2.5)
Magnesium: 2.3 mg/dL (ref 1.5–2.5)

## 2013-04-26 MED ORDER — INSULIN DETEMIR 100 UNIT/ML ~~LOC~~ SOLN
20.0000 [IU] | Freq: Once | SUBCUTANEOUS | Status: AC
  Administered 2013-04-26: 20 [IU] via SUBCUTANEOUS
  Filled 2013-04-26: qty 0.2

## 2013-04-26 MED ORDER — ENOXAPARIN SODIUM 40 MG/0.4ML ~~LOC~~ SOLN
40.0000 mg | Freq: Every day | SUBCUTANEOUS | Status: DC
  Administered 2013-04-26: 40 mg via SUBCUTANEOUS
  Filled 2013-04-26 (×2): qty 0.4

## 2013-04-26 MED ORDER — INSULIN ASPART 100 UNIT/ML ~~LOC~~ SOLN
0.0000 [IU] | SUBCUTANEOUS | Status: DC
  Administered 2013-04-26: 2 [IU] via SUBCUTANEOUS
  Administered 2013-04-26: 4 [IU] via SUBCUTANEOUS
  Administered 2013-04-26 – 2013-04-27 (×3): 2 [IU] via SUBCUTANEOUS

## 2013-04-26 MED ORDER — INSULIN DETEMIR 100 UNIT/ML ~~LOC~~ SOLN
20.0000 [IU] | Freq: Every day | SUBCUTANEOUS | Status: DC
  Administered 2013-04-27: 20 [IU] via SUBCUTANEOUS
  Filled 2013-04-26 (×2): qty 0.2

## 2013-04-26 MED ORDER — FUROSEMIDE 10 MG/ML IJ SOLN
40.0000 mg | Freq: Once | INTRAMUSCULAR | Status: AC
  Administered 2013-04-26: 40 mg via INTRAVENOUS
  Filled 2013-04-26: qty 4

## 2013-04-26 MED FILL — Sodium Chloride IV Soln 0.9%: INTRAVENOUS | Qty: 1000 | Status: AC

## 2013-04-26 MED FILL — Sodium Chloride Irrigation Soln 0.9%: Qty: 3000 | Status: AC

## 2013-04-26 MED FILL — Electrolyte-R (PH 7.4) Solution: INTRAVENOUS | Qty: 4000 | Status: AC

## 2013-04-26 MED FILL — Heparin Sodium (Porcine) Inj 1000 Unit/ML: INTRAMUSCULAR | Qty: 10 | Status: AC

## 2013-04-26 MED FILL — Mannitol IV Soln 20%: INTRAVENOUS | Qty: 500 | Status: AC

## 2013-04-26 MED FILL — Heparin Sodium (Porcine) Inj 1000 Unit/ML: INTRAMUSCULAR | Qty: 30 | Status: AC

## 2013-04-26 MED FILL — Potassium Chloride Inj 2 mEq/ML: INTRAVENOUS | Qty: 40 | Status: AC

## 2013-04-26 MED FILL — Magnesium Sulfate Inj 50%: INTRAMUSCULAR | Qty: 10 | Status: AC

## 2013-04-26 MED FILL — Sodium Bicarbonate IV Soln 8.4%: INTRAVENOUS | Qty: 50 | Status: AC

## 2013-04-26 MED FILL — Lidocaine HCl IV Inj 20 MG/ML: INTRAVENOUS | Qty: 5 | Status: AC

## 2013-04-26 NOTE — Progress Notes (Signed)
Patient experienced 11 beat run of VTACH, prior to run, patient was A-Paced at 80, and after run, patient returned to being A-Paced at 80.  Patient was asymptomatic and all other vitals were stable.

## 2013-04-26 NOTE — Progress Notes (Signed)
1 Day Post-Op Procedure(s) (LRB): CORONARY ARTERY BYPASS GRAFTING (CABG) (N/A) INTRAOPERATIVE TRANSESOPHAGEAL ECHOCARDIOGRAM (N/A) Subjective:  Some chest wall pain, but otherwise ok  Objective: Vital signs in last 24 hours: Temp:  [96.6 F (35.9 C)-99.3 F (37.4 C)] 98.8 F (37.1 C) (10/15 0700) Pulse Rate:  [72-90] 80 (10/15 0700) Cardiac Rhythm:  [-] Atrial paced (10/14 2200) Resp:  [11-32] 15 (10/15 0700) BP: (87-119)/(53-72) 112/62 mmHg (10/15 0700) SpO2:  [95 %-100 %] 98 % (10/15 0700) Arterial Line BP: (92-184)/(46-174) 157/58 mmHg (10/15 0700) FiO2 (%):  [40 %-50 %] 40 % (10/14 1600) Weight:  [106.9 kg (235 lb 10.8 oz)] 106.9 kg (235 lb 10.8 oz) (10/15 0500)  Hemodynamic parameters for last 24 hours: PAP: (10-50)/(6-24) 32/12 mmHg CO:  [4.6 L/min-7.8 L/min] 6.9 L/min CI:  [2 L/min/m2-3.5 L/min/m2] 3.1 L/min/m2  Intake/Output from previous day: 10/14 0701 - 10/15 0700 In: 5966.5 [I.V.:4179.5; Blood:357; NG/GT:30; IV Piggyback:1400] Out: 5525 [Urine:4115; Blood:900; Chest Tube:510] Intake/Output this shift:    General appearance: alert and cooperative Neurologic: intact Heart: regular rate and rhythm, S1, S2 normal, no murmur, click, rub or gallop Lungs: clear to auscultation bilaterally Extremities: extremities normal, mild edema, leg dressings dry Wound: dressing dry  Lab Results:  Recent Labs  04/25/13 1845 04/25/13 1850 04/26/13 0407  WBC 11.7*  --  8.8  HGB 12.4* 11.9* 11.0*  HCT 34.9* 35.0* 31.8*  PLT 155  --  127*   BMET:  Recent Labs  04/25/13 0420  04/25/13 1850 04/26/13 0407  NA 138  < > 140 141  K 4.6  < > 4.0 4.5  CL 102  --  105 108  CO2 25  --   --  25  GLUCOSE 193*  < > 140* 96  BUN 19  --  18 18  CREATININE 1.20  < > 1.20 1.30  CALCIUM 9.4  --   --  8.1*  < > = values in this interval not displayed.  PT/INR:  Recent Labs  04/25/13 1305  LABPROT 15.2  INR 1.23   ABG    Component Value Date/Time   PHART 7.336*  04/25/2013 1747   HCO3 24.0 04/25/2013 1747   TCO2 23 04/25/2013 1850   ACIDBASEDEF 2.0 04/25/2013 1747   O2SAT 92.0 04/25/2013 1747   CBG (last 3)   Recent Labs  04/26/13 0046 04/26/13 0153 04/26/13 0303  GLUCAP 94 102* 95   CXR: decreased lung volumes, bibasilar atelectasis  Assessment/Plan: S/P Procedure(s) (LRB): CORONARY ARTERY BYPASS GRAFTING (CABG) (N/A) INTRAOPERATIVE TRANSESOPHAGEAL ECHOCARDIOGRAM (N/A) Mobilize Diuresis Diabetes control: Hgb A1c was 7.9  Preop. Start Levemir d/c tubes/lines Continue foley due to patient in ICU and urinary output monitoring See progression orders   LOS: 7 days    Rehaan Viloria K 04/26/2013

## 2013-04-26 NOTE — Significant Event (Signed)
Patient has completed ambulating in unit, approximately 475 feet, on RA, saturations 92-97%. VS stable throughout ambulation. Patient returned to sitting in chair, family at bedside. Will continue to monitor. Vonya Ohalloran, Charity fundraiser.

## 2013-04-26 NOTE — Progress Notes (Signed)
PM ROUNDS  POD # 1 CABG  Resting comfortably  BP 141/75  Pulse 87  Temp(Src) 99 F (37.2 C) (Core (Comment))  Resp 18  Ht 6\' 1"  (1.854 m)  Wt 235 lb 10.8 oz (106.9 kg)  BMI 31.1 kg/m2  SpO2 91%   Intake/Output Summary (Last 24 hours) at 04/26/13 2117 Last data filed at 04/26/13 1800  Gross per 24 hour  Intake 1775.43 ml  Output   2050 ml  Net -274.57 ml   CBG well controlled  Creatinine up to 1.4- follow

## 2013-04-27 ENCOUNTER — Inpatient Hospital Stay (HOSPITAL_COMMUNITY): Payer: Managed Care, Other (non HMO)

## 2013-04-27 LAB — CBC
HCT: 31.4 % — ABNORMAL LOW (ref 39.0–52.0)
MCH: 31.7 pg (ref 26.0–34.0)
Platelets: 120 10*3/uL — ABNORMAL LOW (ref 150–400)
RBC: 3.47 MIL/uL — ABNORMAL LOW (ref 4.22–5.81)
RDW: 13.8 % (ref 11.5–15.5)
WBC: 9.5 10*3/uL (ref 4.0–10.5)

## 2013-04-27 LAB — GLUCOSE, CAPILLARY
Glucose-Capillary: 113 mg/dL — ABNORMAL HIGH (ref 70–99)
Glucose-Capillary: 161 mg/dL — ABNORMAL HIGH (ref 70–99)
Glucose-Capillary: 177 mg/dL — ABNORMAL HIGH (ref 70–99)
Glucose-Capillary: 189 mg/dL — ABNORMAL HIGH (ref 70–99)

## 2013-04-27 LAB — BASIC METABOLIC PANEL
Chloride: 102 mEq/L (ref 96–112)
Creatinine, Ser: 1.17 mg/dL (ref 0.50–1.35)
GFR calc Af Amer: 77 mL/min — ABNORMAL LOW (ref >90)
Sodium: 137 mEq/L (ref 135–145)

## 2013-04-27 MED ORDER — ONDANSETRON HCL 4 MG PO TABS
4.0000 mg | ORAL_TABLET | Freq: Four times a day (QID) | ORAL | Status: DC | PRN
  Administered 2013-04-29 (×2): 4 mg via ORAL
  Filled 2013-04-27 (×2): qty 1

## 2013-04-27 MED ORDER — SODIUM CHLORIDE 0.9 % IJ SOLN: 3.0000 mL | INTRAMUSCULAR | Status: DC | PRN

## 2013-04-27 MED ORDER — ALUM & MAG HYDROXIDE-SIMETH 200-200-20 MG/5ML PO SUSP: 15.0000 mL | ORAL | Status: DC | PRN

## 2013-04-27 MED ORDER — ACETAMINOPHEN 325 MG PO TABS
650.0000 mg | ORAL_TABLET | Freq: Four times a day (QID) | ORAL | Status: DC | PRN
  Administered 2013-04-27 – 2013-04-28 (×2): 650 mg via ORAL
  Filled 2013-04-27 (×2): qty 2

## 2013-04-27 MED ORDER — FUROSEMIDE 40 MG PO TABS
40.0000 mg | ORAL_TABLET | Freq: Every day | ORAL | Status: DC
  Administered 2013-04-27 – 2013-04-30 (×3): 40 mg via ORAL
  Filled 2013-04-27 (×4): qty 1

## 2013-04-27 MED ORDER — ONDANSETRON HCL 4 MG/2ML IJ SOLN
4.0000 mg | Freq: Four times a day (QID) | INTRAMUSCULAR | Status: DC | PRN
  Administered 2013-04-28: 4 mg via INTRAVENOUS
  Filled 2013-04-27: qty 2

## 2013-04-27 MED ORDER — SODIUM CHLORIDE 0.9 % IJ SOLN
3.0000 mL | Freq: Two times a day (BID) | INTRAMUSCULAR | Status: DC
  Administered 2013-04-27 – 2013-04-29 (×5): 3 mL via INTRAVENOUS

## 2013-04-27 MED ORDER — POTASSIUM CHLORIDE CRYS ER 20 MEQ PO TBCR
20.0000 meq | EXTENDED_RELEASE_TABLET | Freq: Every day | ORAL | Status: DC
  Administered 2013-04-27 – 2013-04-30 (×4): 20 meq via ORAL
  Filled 2013-04-27 (×4): qty 1

## 2013-04-27 MED ORDER — SODIUM CHLORIDE 0.9 % IV SOLN: 250.0000 mL | INTRAVENOUS | Status: DC | PRN

## 2013-04-27 MED ORDER — MOVING RIGHT ALONG BOOK
Freq: Once | Status: AC
  Administered 2013-04-27: 13:00:00
  Filled 2013-04-27: qty 1

## 2013-04-27 MED ORDER — MAGNESIUM HYDROXIDE 400 MG/5ML PO SUSP: 30.0000 mL | Freq: Every day | ORAL | Status: DC | PRN

## 2013-04-27 MED ORDER — CHLORHEXIDINE GLUCONATE CLOTH 2 % EX PADS: 6.0000 | MEDICATED_PAD | Freq: Every day | CUTANEOUS | Status: DC

## 2013-04-27 MED ORDER — TRAMADOL HCL 50 MG PO TABS: 50.0000 mg | ORAL_TABLET | ORAL | Status: DC | PRN

## 2013-04-27 MED ORDER — INSULIN ASPART 100 UNIT/ML ~~LOC~~ SOLN
0.0000 [IU] | Freq: Three times a day (TID) | SUBCUTANEOUS | Status: DC
  Administered 2013-04-27 – 2013-04-28 (×3): 4 [IU] via SUBCUTANEOUS
  Administered 2013-04-28: 2 [IU] via SUBCUTANEOUS
  Administered 2013-04-28: 4 [IU] via SUBCUTANEOUS
  Administered 2013-04-28: 12 [IU] via SUBCUTANEOUS
  Administered 2013-04-29: 2 [IU] via SUBCUTANEOUS
  Administered 2013-04-29 (×2): 4 [IU] via SUBCUTANEOUS
  Administered 2013-04-29 – 2013-04-30 (×2): 2 [IU] via SUBCUTANEOUS

## 2013-04-27 MED ORDER — MUPIROCIN 2 % EX OINT
1.0000 "application " | TOPICAL_OINTMENT | Freq: Two times a day (BID) | CUTANEOUS | Status: DC
  Administered 2013-04-27: 1 via NASAL
  Filled 2013-04-27: qty 22

## 2013-04-27 NOTE — Progress Notes (Signed)
Pt t/x to 2W14 in wheelchair on monitor without event. Pt's family with him during move. Pt's CPAP machine and mask, as well as cell phone t/x with him to new room. Heather RN present during pt arrival to unit for telemetry hookup and bedside report  Felipa Emory

## 2013-04-27 NOTE — Progress Notes (Signed)
CARDIAC REHAB PHASE I   PRE:  Rate/Rhythm: 90 SR  BP:  Supine:   Sitting: 124/70  Standing:    SaO2: 97 RA  MODE:  Ambulation: 340 ft   POST:  Rate/Rhythm: 96  BP:  Supine:   Sitting: 130/70  Standing:    SaO2: 91 RA 1355-1420 Assisted X 1 and used walker to ambulate. Gait steady with walker. Pt's only c/o is of gas pain, states that his bowels has not moved since surgery. He was able to walk 340 feet. Pt back to recliner after walk with call light in reach.  Melina Copa RN 04/27/2013 2:17 PM

## 2013-04-27 NOTE — Progress Notes (Addendum)
[facility] DAILY ICU PROGRESS NOTE                   [address]            [place] [address]          [phone]   2 Days Post-Op Procedure(s) (LRB): CORONARY ARTERY BYPASS GRAFTING (CABG) (N/A) INTRAOPERATIVE TRANSESOPHAGEAL ECHOCARDIOGRAM (N/A)  Total Length of Stay:  LOS: 8 days   Subjective: OOB in chair.  No complaints except irritation from Foley catheter.  Has walked several times in halls. Minimal pain, breathing stable.   Objective: Vital signs in last 24 hours: Temp:  [98.8 F (37.1 C)-99.5 F (37.5 C)] 99 F (37.2 C) (10/16 0400) Pulse Rate:  [67-87] 82 (10/16 0600) Cardiac Rhythm:  [-] Normal sinus rhythm (10/16 0400) Resp:  [13-28] 19 (10/16 0600) BP: (108-141)/(40-77) 133/72 mmHg (10/16 0600) SpO2:  [89 %-98 %] 92 % (10/16 0600) Arterial Line BP: (118-170)/(43-69) 142/53 mmHg (10/15 1600) Weight:  [232 lb (105.235 kg)] 232 lb (105.235 kg) (10/16 0500)  Filed Weights   04/25/13 0541 04/26/13 0500 04/27/13 0500  Weight: 226 lb 6.6 oz (102.7 kg) 235 lb 10.8 oz (106.9 kg) 232 lb (105.235 kg)  PRE OP WEIGHT: 102 kg   Weight change: -3 lb 10.8 oz (-1.666 kg)   Hemodynamic parameters for last 24 hours: PAP: (35-42)/(13-20) 35/17 mmHg CO:  [6.7 L/min] 6.7 L/min CI:  [3 L/min/m2] 3 L/min/m2  Intake/Output from previous day: 10/15 0701 - 10/16 0700 In: 1418 [P.O.:600; I.V.:718; IV Piggyback:100] Out: 1925 [Urine:1745; Chest Tube:180]  Intake/Output this shift:    Current Meds: Scheduled Meds: . acetaminophen  1,000 mg Oral Q6H   Or  . acetaminophen (TYLENOL) oral liquid 160 mg/5 mL  1,000 mg Per Tube Q6H  . aspirin EC  325 mg Oral Daily   Or  . aspirin  324 mg Per Tube Daily  . atorvastatin  80 mg Oral q1800  . bisacodyl  10 mg Oral Daily   Or  . bisacodyl  10 mg Rectal Daily  . cefUROXime (ZINACEF)  IV  1.5 g Intravenous Q12H  . Chlorhexidine Gluconate Cloth  6 each Topical Daily  . colchicine  0.6 mg Oral Daily  . docusate  sodium  200 mg Oral Daily  . enoxaparin (LOVENOX) injection  40 mg Subcutaneous QHS  . insulin aspart  0-24 Units Subcutaneous Q4H  . insulin detemir  20 Units Subcutaneous Daily  . insulin regular  0-10 Units Intravenous TID WC  . metoprolol tartrate  12.5 mg Oral BID   Or  . metoprolol tartrate  12.5 mg Per Tube BID  . multivitamin with minerals  1 tablet Oral Daily  . mupirocin ointment  1 application Nasal BID  . pantoprazole  40 mg Oral Daily  . sodium chloride  3 mL Intravenous Q12H   Continuous Infusions: . sodium chloride 20 mL/hr (04/25/13 1316)  . sodium chloride Stopped (04/26/13 1035)  . sodium chloride    . dexmedetomidine Stopped (04/25/13 1700)  . insulin (NOVOLIN-R) infusion Stopped (04/26/13 1407)  . lactated ringers Stopped (04/26/13 1800)  . nitroGLYCERIN Stopped (04/26/13 1500)  . phenylephrine (NEO-SYNEPHRINE) Adult infusion Stopped (04/25/13 1530)   PRN Meds:.metoprolol, midazolam, morphine injection, ondansetron (ZOFRAN) IV, oxyCODONE, sodium chloride   CBGs [id number]   Physical Exam: General appearance: alert, cooperative and no distress Heart: regular rate and rhythm Lungs: Slightly decreased BS in bases Extremities: Trace LE edema, R>L Wound: Dressed and dry  Lab Results: CBC: Recent Labs  04/26/13 1700 04/26/13 1752 04/27/13 0430  WBC 9.6  --  9.5  HGB 11.9* 11.9* 11.0*  HCT 35.0* 35.0* 31.4*  PLT 144*  --  120*   BMET:  Recent Labs  04/26/13 0407  04/26/13 1752 04/27/13 0430  NA 141  --  138 137  K 4.5  --  4.4 4.1  CL 108  --  102 102  CO2 25  --   --  28  GLUCOSE 96  --  169* 114*  BUN 18  --  19 18  CREATININE 1.30  < > 1.40* 1.17  CALCIUM 8.1*  --   --  8.2*  < > = values in this interval not displayed.  PT/INR:  Recent Labs  04/25/13 1305  LABPROT 15.2  INR 1.23   Radiology: Dg Chest Portable 1 View In Am  04/26/2013   *RADIOLOGY REPORT*  Clinical Data: Postop CABG, subsequent encounter.   PORTABLE CHEST - 1 VIEW  Comparison: 04/25/2013; 04/18/2013  Findings:  Grossly unchanged enlarged cardiac silhouette and mediastinal contours given decreased lung volumes.  Interval extubation and removal of enteric tube.  Otherwise, stable positioning of remaining support apparatus.  No pneumothorax.  Persistent mild cephalization of flow without evidence of edema.  Worsening bibasilar opacities, likely atelectasis.  Trace bilateral effusions are suspected.  Unchanged bones.  IMPRESSION: 1.  Interval extubation and removal of enteric tube.  Otherwise, stable positioning of remaining support apparatus.  No pneumothorax. 2.  Decreased lung volumes with worsening bibasilar atelectasis. 3.  Pulmonary venous congestion without frank evidence of edema.   Original Report Authenticated By: Tacey Ruiz, MD   Dg Chest Portable 1 View  04/25/2013   CLINICAL DATA:  Postop day 0, status post CABG.  EXAM: PORTABLE CHEST - 1 VIEW  COMPARISON:  05/25/2013  FINDINGS: Endotracheal tube tip 5.1 cm above the carina. Nasogastric tube enters the stomach.  Bilateral chest tubes and mediastinal drain noted. Swan-Ganz catheter in the main pulmonary artery.  Low lung volumes are present, causing crowding of the pulmonary vasculature. No significant pneumothorax. No pleural effusion observed. Indistinct pulmonary vasculature noted. Upper normal heart size with poor definition of the AP window.  IMPRESSION: 1. Indistinct pulmonary vasculature compatible with pulmonary venous hypertension. 2. Support apparatus peer satisfactorily positioned.   Electronically Signed   By: Herbie Baltimore M.D.   On: 04/25/2013 13:45     Assessment/Plan: S/P Procedure(s) (LRB): CORONARY ARTERY BYPASS GRAFTING (CABG) (N/A) INTRAOPERATIVE TRANSESOPHAGEAL ECHOCARDIOGRAM (N/A)  CV- Maintaining SR, BPs stable, off all drips. Continue Lopressor.  DM- sugars generally stable.  Continue Levemir for now, resume Metformin soon since creatinine back to  baseline.  Vol overload- diurese.  CRPI, pulm toilet.  Renal function back to baseline. Monitor.  Probably ready for tx to 2000 later today.  D/c Foley.   COLLINS,GINA H 04/27/2013 7:43 AM  Chart reviewed, patient examined, agree with above. CXR shows marked bilateral lower lobe atelectasis. He needs to work hard on IS, ambulation. Will repeat cxr in am.

## 2013-04-28 ENCOUNTER — Inpatient Hospital Stay (HOSPITAL_COMMUNITY): Payer: Managed Care, Other (non HMO)

## 2013-04-28 DIAGNOSIS — Z951 Presence of aortocoronary bypass graft: Secondary | ICD-10-CM

## 2013-04-28 LAB — BASIC METABOLIC PANEL
CO2: 25 mEq/L (ref 19–32)
Calcium: 8.9 mg/dL (ref 8.4–10.5)
Chloride: 101 mEq/L (ref 96–112)
Creatinine, Ser: 1.27 mg/dL (ref 0.50–1.35)
GFR calc Af Amer: 69 mL/min — ABNORMAL LOW (ref >90)
Potassium: 3.9 mEq/L (ref 3.5–5.1)
Sodium: 137 mEq/L (ref 135–145)

## 2013-04-28 LAB — GLUCOSE, CAPILLARY
Glucose-Capillary: 164 mg/dL — ABNORMAL HIGH (ref 70–99)
Glucose-Capillary: 188 mg/dL — ABNORMAL HIGH (ref 70–99)

## 2013-04-28 LAB — CBC
Hemoglobin: 12.6 g/dL — ABNORMAL LOW (ref 13.0–17.0)
MCH: 31.7 pg (ref 26.0–34.0)
Platelets: 170 10*3/uL (ref 150–400)
RBC: 3.98 MIL/uL — ABNORMAL LOW (ref 4.22–5.81)
RDW: 13.8 % (ref 11.5–15.5)
WBC: 13 10*3/uL — ABNORMAL HIGH (ref 4.0–10.5)

## 2013-04-28 MED ORDER — METOPROLOL TARTRATE 25 MG PO TABS
25.0000 mg | ORAL_TABLET | Freq: Two times a day (BID) | ORAL | Status: DC
  Administered 2013-04-28 – 2013-04-30 (×5): 25 mg via ORAL
  Filled 2013-04-28 (×7): qty 1

## 2013-04-28 MED ORDER — METFORMIN HCL 500 MG PO TABS
500.0000 mg | ORAL_TABLET | Freq: Two times a day (BID) | ORAL | Status: DC
  Administered 2013-04-28 – 2013-04-30 (×4): 500 mg via ORAL
  Filled 2013-04-28 (×6): qty 1

## 2013-04-28 MED ORDER — METFORMIN HCL 500 MG PO TABS: 500.0000 mg | ORAL_TABLET | Freq: Every day | ORAL | Status: DC

## 2013-04-28 MED ORDER — LIVING WELL WITH DIABETES BOOK
Freq: Once | Status: AC
  Administered 2013-04-28: 14:00:00
  Filled 2013-04-28: qty 1

## 2013-04-28 MED ORDER — LISINOPRIL 2.5 MG PO TABS
2.5000 mg | ORAL_TABLET | Freq: Every day | ORAL | Status: DC
  Administered 2013-04-28 – 2013-04-29 (×2): 2.5 mg via ORAL
  Filled 2013-04-28 (×3): qty 1

## 2013-04-28 MED ORDER — POTASSIUM CHLORIDE CRYS ER 20 MEQ PO TBCR
20.0000 meq | EXTENDED_RELEASE_TABLET | Freq: Once | ORAL | Status: AC
  Administered 2013-04-28: 20 meq via ORAL

## 2013-04-28 NOTE — Progress Notes (Addendum)
CARDIAC REHAB PHASE I   PRE:  Rate/Rhythm: 95 SR  BP:  Supine:   Sitting: 126/70  Standing:    SaO2: 96 RA  MODE:  Ambulation: 550 ft   POST:  Rate/Rhythm: 95  BP:  Supine:   Sitting: 122/80  Standing:    SaO2: 95 RA 1055-1130 Assisted X 1 and used walker to ambulate. Gait steady with walker. VS stable Pt to recliner after walk with call light in reach. Pt's wife is concerned about his diabetes control at home and that he has never had any formal education. I discussed with his RN and ask if he could have a consult with the diabetic facilitator. Encouraged pt and wife to watch Recovery from heart surgery video.  Melina Copa RN 04/28/2013 11:25 AM

## 2013-04-28 NOTE — Discharge Summary (Signed)
Physician Discharge Summary  Patient ID: [name] [name] MRN: [id number] DOB/AGE: Feb 04, 1951 62 y.o.  Admit date: 04/19/2013 Discharge date: 04/30/2013  Admission Diagnoses:  Patient Active Problem List   Diagnosis Date Noted  . S/P CABG x 4 04/28/2013  . Gout   . Non-ST elevation myocardial infarction (NSTEMI), initial episode of care 04/19/2013  . Diabetes   . HTN (hypertension)   . OSA on CPAP   . GERD (gastroesophageal reflux disease)    Discharge Diagnoses:   Patient Active Problem List   Diagnosis Date Noted  . S/P CABG x 4 04/28/2013  . Gout   . Non-ST elevation myocardial infarction (NSTEMI), initial episode of care 04/19/2013  . Diabetes   . HTN (hypertension)   . OSA on CPAP   . GERD (gastroesophageal reflux disease)    Discharged Condition: good  History of Present Illness:   [name] [name] is a 62 yo male who presented to [facility] Emergency Department with a 3 week history of exertional chest discomfort.  This was associated with nausea and dry heaves.  The patient stated his most recent episode occurred the day of presentation after shoveling.  The episode last 30 minutes which prompted his presentation to the Emergency Department.  Workup in the ED revealed the patient to be suffering from a NSTEMI and he was subsequently transferred to [facility] for further treatment.     Hospital Course:   Upon arrival patient was admitted by Cardiology.  He underwent Cardiac Catheterization which showed severe 3 vessel CAD with a reduction of his ejection fraction to 45%.  It was felt that due to patients history of diabetes he would best be treated with Coronary Bypass procedure and TCTS was consulted.  The patient was evaluated by Dr. Laneta Simmers who felt the patient would benefit from Coronary Bypass Grafting.  The risks and benefits of the procedure were explained to the patient and he was agreeable to proceed.  Patient continued to have occasional "twinges" of chest  pain during his hospitalization.  He was placed on IV heparin which did provide some relief.  He was taken to the operating room on 04/25/2013.  He underwent CABG x 4 utilizing LIMA to LAD, SVG to Diagonal 1, SVG to Diagonal 2, and SVG to OM.  He also underwent Endoscopic Saphenous vein harvest from his right thigh.  He tolerated the procedure well and was taken to the SICU in stable condition.  The patient was extubated the evening of surgery.  During his stay in the ICU the patient.  He developed episode of V. Tach overnight and his pace maker was initiated.  His chest tubes and arterial lines were removed without difficulty.  He was weaned off all cardiac drips.  He is maintaining NSR and was transferred to the step down unit in stable condition.  The patient continued to progress.  He had some mild tachycardia and hypertension and his Beta Blocker was increased and he was started on an ACE- Inhibitor.  He is maintaining NSR and his pacing wires have been removed without difficulty.  He is ambulating without difficulty and tolerating a cardiac diet.  Should no further issues arise we anticipate patient discharge for the next 24-48 hours.  He will need to follow up with Dr. Laneta Simmers on 05/24/2013 with a chest xray prior to his appointment.  The patient will also need to follow up with Dr. Excell Seltzer in 2-4 weeks.     Consults: cardiology  Significant Diagnostic Studies: angiography:  Hemodynamics:  AO 122/69 with a mean of 92  LV 119/14  Coronary angiography:  Coronary dominance: right  Left mainstem: The left main is patent with no obstructive disease present. The vessel arises from the left cusp. It divides into the LAD and left circumflex.  Left anterior descending (LAD): The LAD is severely diseased. The proximal LAD has 90% stenosis involving the origin of large first diagonal branch. The mid LAD has 90% stenosis involving the origin of the medium caliber second diagonal branch. The LAD wraps around  the left ventricular apex. The first diagonal supplies a large territory. There is 80% ostial stenosis. The second diagonal supplies a medium territory and there is 50% ostial stenosis.  Left circumflex (LCx): The left circumflex has 99% proximal stenosis extending into the first obtuse marginal branch. The filling of the first OM is slightly delayed. The AV groove circumflex beyond the first OM is totally occluded and fills via right to left collaterals.  Right coronary artery (RCA): The right coronary artery is a large, dominant vessel. The mid vessel has 40-50% stenosis. The distal vessel or at the bifurcation of the posterior AV segment and PDA has 50-60% stenosis. The PDA is patent. The posterolateral branches are small but patent.  Left ventriculography: Left ventricular systolic function is moderately impaired. There is hypokinesis of the anterolateral wall. There is mild hypokinesis of the distal inferior wall. The estimated left ventricular ejection fraction is 45%.   Treatments: surgery:   1. Median Sternotomy 2. Extracorporeal circulation       3. Coronary artery bypass grafting x 4  Left internal mammary graft to the LAD  SVG to OD  SVG to D2  SVG to OM       4. Endoscopic vein harvest from the right leg  Disposition: Home  Discharge Medications:    Medication List    STOP taking these medications       lisinopril 10 MG tablet  Commonly known as:  PRINIVIL,ZESTRIL      TAKE these medications       aspirin 325 MG EC tablet  Take 1 tablet (325 mg total) by mouth daily.     atorvastatin 80 MG tablet  Commonly known as:  LIPITOR  Take 1 tablet (80 mg total) by mouth daily at 6 PM.     metFORMIN 500 MG tablet  Commonly known as:  GLUCOPHAGE  Take 1 tablet (500 mg total) by mouth 2 (two) times daily with a meal.     metoprolol tartrate 25 MG tablet  Commonly known as:  LOPRESSOR  Take 1 tablet (25 mg total) by mouth 2 (two) times daily.     multivitamin with  minerals Tabs tablet  Take 1 tablet by mouth daily.     omeprazole 20 MG capsule  Commonly known as:  PRILOSEC  Take 20 mg by mouth daily.     oxyCODONE 5 MG immediate release tablet  Commonly known as:  Oxy IR/ROXICODONE  Take 1-2 tablets (5-10 mg total) by mouth every 4 (four) hours as needed for pain.        The patient has been discharged on:   1.Beta Blocker:  Yes [ x  ]                              No   [   ]  If No, reason:  2.Ace Inhibitor/ARB: Yes [  ]                                     No  [  x  ]                                     If No, reason: Creatinine trending upward  3.Statin:   Yes [  x ]                  No  [   ]                  If No, reason:  4.Ecasa:  Yes  [  x ]                  No   [   ]                  If No, reason:     Discharge Orders   Future Appointments Provider Department Dept Phone   05/24/2013 11:30 AM Alleen Borne, MD [facility] [phone]   Future Orders Complete By Expires   Amb Referral to Cardiac Rehabilitation  As directed      Follow-up Information   Follow up with Alleen Borne, MD On 05/24/2013. (Appointment is at 11:30)    Specialty:  Cardiothoracic Surgery   Contact information:   [address] [place] [address] [place] [address] [phone]       Follow up with  [facility] On 05/24/2013. (Please get CXR at 10:30)    Contact information:   [place]       Schedule an appointment as soon as possible for a visit with Tonny Bollman, MD. (Please contact office to set up 2-4 week appointment)    Specialty:  Cardiology   Contact information:   [address] [place] [address] [phone]       Follow up with Selinda Flavin, MD. (Call for a follow up appointment regarding diabetes management and surveillance of HGA1C 7.9)    Specialty:  Family Medicine   Contact information:   [address] [place]  [address] [phone]       Signed: Ardelle Balls PA-C 04/30/2013, 9:25 AM

## 2013-04-28 NOTE — Progress Notes (Addendum)
      [address]       [place] [address]             [phone]        3 Days Post-Op Procedure(s) (LRB): CORONARY ARTERY BYPASS GRAFTING (CABG) (N/A) INTRAOPERATIVE TRANSESOPHAGEAL ECHOCARDIOGRAM (N/A)  Subjective: Patient feeling better this am.  Objective: Vital signs in last 24 hours: Temp:  [98 F (36.7 C)-99.9 F (37.7 C)] 98.7 F (37.1 C) (10/17 0416) Pulse Rate:  [84-97] 85 (10/17 0416) Cardiac Rhythm:  [-] Normal sinus rhythm;Sinus tachycardia (10/17 0736) Resp:  [17-24] 18 (10/17 0416) BP: (128-143)/(75-86) 138/85 mmHg (10/17 0416) SpO2:  [92 %-95 %] 95 % (10/17 0416) Weight:  [103.783 kg (228 lb 12.8 oz)] 103.783 kg (228 lb 12.8 oz) (10/17 0416)  Pre op weight  102 kg Current Weight  04/28/13 103.783 kg (228 lb 12.8 oz)      Intake/Output from previous day: 10/16 0701 - 10/17 0700 In: 570 [P.O.:480; I.V.:40; IV Piggyback:50] Out: 1150 [Urine:1150]   Physical Exam:  Cardiovascular: RRR, no murmurs, gallops, or rubs. Pulmonary:Slightly diminished at bases; no rales, wheezes, or rhonchi. Abdomen: Soft, non tender, bowel sounds present. Extremities: Mild bilateral lower extremity edema. Wounds: Clean and dry.  No erythema or signs of infection.  Lab Results: CBC:  Recent Labs  04/27/13 0430 04/28/13 0612  WBC 9.5 13.0*  HGB 11.0* 12.6*  HCT 31.4* 36.1*  PLT 120* 170   BMET:   Recent Labs  04/27/13 0430 04/28/13 0612  NA 137 137  K 4.1 3.9  CL 102 101  CO2 28 25  GLUCOSE 114* 169*  BUN 18 19  CREATININE 1.17 1.27  CALCIUM 8.2* 8.9    PT/INR:  Lab Results  Component Value Date   INR 1.23 04/25/2013   INR 1.00 04/24/2013   ABG:  INR: Will add last result for INR, ABG once components are confirmed Will add last 4 CBG results once components are confirmed  Assessment/Plan:  1. CV - SR. On Lopressor 12.5 bid and will increase Lopressor to 25 bid. Will restart low dose ACE 2.  Pulmonary - CXR this am shows  no pneumothorax, bibasilar atelectasis, and small bilateral pleural effusions.Encourage incentive spirometer 3. Volume Overload - On Lasix 40 daily 4.  Acute blood loss anemia - H and H 12.6 and 36.1 5.Supplement potassium 6.DM-CBGs 177/189/164. On Insulin. Will restart Metformin. Pre op HGA1C 7.9. Will need follow up as an outpatient. 7.Remove EPW in am 8.Possible discharge Sunday  ZIMMERMAN,DONIELLE MPA-C 04/28/2013,8:20 AM   Chart reviewed, patient examined, agree with above.

## 2013-04-28 NOTE — Progress Notes (Signed)
      [address]       [place] [address]             [phone]        3 Days Post-Op Procedure(s) (LRB): CORONARY ARTERY BYPASS GRAFTING (CABG) (N/A) INTRAOPERATIVE TRANSESOPHAGEAL ECHOCARDIOGRAM (N/A)  Subjective: Patient feeling better this am.  Objective: Vital signs in last 24 hours: Temp:  [98 F (36.7 C)-99.9 F (37.7 C)] 98.7 F (37.1 C) (10/17 0416) Pulse Rate:  [84-97] 85 (10/17 0416) Cardiac Rhythm:  [-] Normal sinus rhythm;Sinus tachycardia (10/17 0736) Resp:  [17-24] 18 (10/17 0416) BP: (128-143)/(75-86) 138/85 mmHg (10/17 0416) SpO2:  [92 %-95 %] 95 % (10/17 0416) Weight:  [103.783 kg (228 lb 12.8 oz)] 103.783 kg (228 lb 12.8 oz) (10/17 0416)  Pre op weight  102 kg Current Weight  04/28/13 103.783 kg (228 lb 12.8 oz)      Intake/Output from previous day: 10/16 0701 - 10/17 0700 In: 570 [P.O.:480; I.V.:40; IV Piggyback:50] Out: 1150 [Urine:1150]   Physical Exam:  Cardiovascular: RRR, no murmurs, gallops, or rubs. Pulmonary:Slightly diminished at bases; no rales, wheezes, or rhonchi. Abdomen: Soft, non tender, bowel sounds present. Extremities: Mild bilateral lower extremity edema. Wounds: Clean and dry.  No erythema or signs of infection.  Lab Results: CBC: Recent Labs  04/27/13 0430 04/28/13 0612  WBC 9.5 13.0*  HGB 11.0* 12.6*  HCT 31.4* 36.1*  PLT 120* 170   BMET:  Recent Labs  04/27/13 0430 04/28/13 0612  NA 137 137  K 4.1 3.9  CL 102 101  CO2 28 25  GLUCOSE 114* 169*  BUN 18 19  CREATININE 1.17 1.27  CALCIUM 8.2* 8.9    PT/INR:  Lab Results  Component Value Date   INR 1.23 04/25/2013   INR 1.00 04/24/2013   ABG:  INR: Will add last result for INR, ABG once components are confirmed Will add last 4 CBG results once components are confirmed  Assessment/Plan:  1. CV - SR. On Lopressor 12.5 bid and will increase Lopressor to 25 bid. Will restart low dose ACE 2.  Pulmonary - CXR this am shows no  pneumothorax, bibasilar atelectasis, and small bilateral pleural effusions.Encourage incentive spirometer 3. Volume Overload - On Lasix 40 daily 4.  Acute blood loss anemia - H and H 12.6 and 36.1 5.Supplement potassium 6.DM-CBGs 177/189/164. On Insulin. Will restart Metformin. 7.Remove EPW in am 8.Possible discharge Sunday  Ipek Westra MPA-C 04/28/2013,8:00 AM

## 2013-04-28 NOTE — Progress Notes (Signed)
Spoke with patient and wife about diabetes.  States that he was diagnosed about 4-5 years ago, but has never had any formal DM education outside the hospital.  States that he is a [employer] and does not get much exercise during the day. Deos eat fast food when on the road.  Has lost some weight fairly recently. States that physician told him that he would not be going home on insulin.  Has been watching DM videos while in the hospital. Talked with them about importance of healthy diet, exercise, and checking blood sugars several times per day.  Does have a home blood glucose meter.  Given information about free DM classes at [facility] since he lives in [place] and classes held by [employer].  Spoke with staff RN about patient going to cardiac rehab after discharge.  Follow up on diet and exercise for diabetes will be included with cardiac rehab.  Will give patient information on fast food, CHO counting, general care for diabetes.  Will continue to follow while in hospital.     Smith Mince RN BSN CDE

## 2013-04-29 LAB — GLUCOSE, CAPILLARY
Glucose-Capillary: 149 mg/dL — ABNORMAL HIGH (ref 70–99)
Glucose-Capillary: 187 mg/dL — ABNORMAL HIGH (ref 70–99)
Glucose-Capillary: 167 mg/dL — ABNORMAL HIGH (ref 70–99)
Glucose-Capillary: 121 mg/dL — ABNORMAL HIGH (ref 70–99)

## 2013-04-29 MED ORDER — METFORMIN HCL 500 MG PO TABS: 500.0000 mg | ORAL_TABLET | Freq: Two times a day (BID) | ORAL | Status: DC

## 2013-04-29 MED ORDER — ASPIRIN 325 MG PO TBEC: 325.0000 mg | DELAYED_RELEASE_TABLET | Freq: Every day | ORAL | Status: DC

## 2013-04-29 MED ORDER — OXYCODONE HCL 5 MG PO TABS: 5.0000 mg | ORAL_TABLET | ORAL | Status: DC | PRN

## 2013-04-29 MED ORDER — METOPROLOL TARTRATE 25 MG PO TABS: 25.0000 mg | ORAL_TABLET | Freq: Two times a day (BID) | ORAL | Status: DC

## 2013-04-29 MED ORDER — POTASSIUM CHLORIDE CRYS ER 20 MEQ PO TBCR: 20.0000 meq | EXTENDED_RELEASE_TABLET | Freq: Every day | ORAL | Status: DC

## 2013-04-29 MED ORDER — FUROSEMIDE 40 MG PO TABS: 40.0000 mg | ORAL_TABLET | Freq: Every day | ORAL | Status: DC

## 2013-04-29 MED ORDER — LISINOPRIL 5 MG PO TABS: 5.0000 mg | ORAL_TABLET | Freq: Every day | ORAL | Status: DC

## 2013-04-29 MED ORDER — ATORVASTATIN CALCIUM 80 MG PO TABS: 80.0000 mg | ORAL_TABLET | Freq: Every day | ORAL | Status: DC

## 2013-04-29 NOTE — Progress Notes (Addendum)
      [address]       [place] [address]             [phone]        4 Days Post-Op Procedure(s) (LRB): CORONARY ARTERY BYPASS GRAFTING (CABG) (N/A) INTRAOPERATIVE TRANSESOPHAGEAL ECHOCARDIOGRAM (N/A)  Subjective: Patient had nausea last evening, but Zofran relieved it. Has had a bowel movement.  Objective: Vital signs in last 24 hours: Temp:  [98.5 F (36.9 C)-98.6 F (37 C)] 98.5 F (36.9 C) (10/18 0605) Pulse Rate:  [80-90] 85 (10/18 0605) Cardiac Rhythm:  [-] Normal sinus rhythm (10/17 1945) Resp:  [18] 18 (10/18 0605) BP: (115-122)/(64-70) 117/67 mmHg (10/18 0605) SpO2:  [95 %-97 %] 95 % (10/18 0605) Weight:  [102.6 kg (226 lb 3.1 oz)] 102.6 kg (226 lb 3.1 oz) (10/18 0605)  Pre op weight  102 kg Current Weight  04/29/13 102.6 kg (226 lb 3.1 oz)      Intake/Output from previous day: 10/17 0701 - 10/18 0700 In: 480 [P.O.:480] Out: -    Physical Exam:  Cardiovascular: RRR, no murmurs, gallops, or rubs. Pulmonary:Slightly diminished at bases; no rales, wheezes, or rhonchi. Abdomen: Soft, non tender, bowel sounds present. Extremities: Mild bilateral lower extremity edema. Wounds: Clean and dry.  No erythema or signs of infection.  Lab Results: CBC:  Recent Labs  04/27/13 0430 04/28/13 0612  WBC 9.5 13.0*  HGB 11.0* 12.6*  HCT 31.4* 36.1*  PLT 120* 170   BMET:   Recent Labs  04/27/13 0430 04/28/13 0612  NA 137 137  K 4.1 3.9  CL 102 101  CO2 28 25  GLUCOSE 114* 169*  BUN 18 19  CREATININE 1.17 1.27  CALCIUM 8.2* 8.9    PT/INR:  Lab Results  Component Value Date   INR 1.23 04/25/2013   INR 1.00 04/24/2013   ABG:  INR: Will add last result for INR, ABG once components are confirmed Will add last 4 CBG results once components are confirmed  Assessment/Plan:  1. CV - SR. On Lopressor 25 bid and Lisinopril 2.5 daily. 2.  Pulmonary - Encourage incentive spirometer 3. Volume Overload - On Lasix 40 daily 4.   Acute blood loss anemia - H and H 12.6 and 36.1 5.Supplement potassium 6.DM-CBGs 188/155/149. Continue Metformin 500 bid. Will need follow up as an outpatient as HGA1C 7.8 7.Remove EPW  8.Possible discharge am  ZIMMERMAN,DONIELLE MPA-C 04/29/2013,7:34 AM   Chart reviewed, patient examined, agree with above. Repeat bmet in am to be sure creat is stable since on lisinopril and lasix.

## 2013-04-29 NOTE — Progress Notes (Signed)
Patient was a little nauseated this evening.  We did not ambulate as a result. He was given IV zofran and some ginger ale.  Patient stated that his nausea was reduced. Will continue to monitor.

## 2013-04-29 NOTE — Progress Notes (Signed)
Removed Pt's disconnected pacing wires.  Pt was laying flat in bed, and tolerated this well.  No resistant was met in removal.  Pt had no c/o of discomfort following their removal.  Pt will remain on bedrest for one hour and we will be monitoring his VS q15 minutes until bedrest is over.

## 2013-04-29 NOTE — Progress Notes (Signed)
CARDIAC REHAB PHASE I   12:35-1:40 Patient had just completed a walk with family.  Patient and family were educated on going home, exercise, restrictions, and diet.  Patient and family were very receptive.   Theresa Duty, [place] 04/29/2013 1:39 PM

## 2013-04-30 LAB — BASIC METABOLIC PANEL
CO2: 26 mEq/L (ref 19–32)
Chloride: 97 mEq/L (ref 96–112)
Sodium: 134 mEq/L — ABNORMAL LOW (ref 135–145)

## 2013-04-30 LAB — GLUCOSE, CAPILLARY: Glucose-Capillary: 154 mg/dL — ABNORMAL HIGH (ref 70–99)

## 2013-04-30 MED ORDER — ATORVASTATIN CALCIUM 80 MG PO TABS: 80.0000 mg | ORAL_TABLET | Freq: Every day | ORAL | Status: DC

## 2013-04-30 MED ORDER — LISINOPRIL 2.5 MG PO TABS: 2.5000 mg | ORAL_TABLET | Freq: Every day | ORAL | Status: DC

## 2013-04-30 NOTE — Progress Notes (Signed)
Patient expressed that he did not want to complete an evening walk. Will continue to monitor.

## 2013-04-30 NOTE — Progress Notes (Addendum)
      [address]       [place] [address]             [phone]        5 Days Post-Op Procedure(s) (LRB): CORONARY ARTERY BYPASS GRAFTING (CABG) (N/A) INTRAOPERATIVE TRANSESOPHAGEAL ECHOCARDIOGRAM (N/A)  Subjective: Patient without complaints. He is looking forward to going home.  Objective: Vital signs in last 24 hours: Temp:  [97.6 F (36.4 C)-99.4 F (37.4 C)] 98.1 F (36.7 C) (10/19 0452) Pulse Rate:  [68-90] 68 (10/19 0452) Cardiac Rhythm:  [-] Normal sinus rhythm (10/18 2017) Resp:  [18-19] 18 (10/19 0452) BP: (111-139)/(61-74) 113/74 mmHg (10/19 0452) SpO2:  [93 %-98 %] 95 % (10/19 0452) Weight:  [101.8 kg (224 lb 6.9 oz)] 101.8 kg (224 lb 6.9 oz) (10/19 0452)  Pre op weight  102 kg Current Weight  04/30/13 101.8 kg (224 lb 6.9 oz)      Intake/Output from previous day: 10/18 0701 - 10/19 0700 In: 720 [P.O.:720] Out: -    Physical Exam:  Cardiovascular: RRR, no murmurs, gallops, or rubs. Pulmonary:Clear; no rales, wheezes, or rhonchi. Abdomen: Soft, non tender, bowel sounds present. Extremities: Trace bilateral lower extremity edema. Wounds: Clean and dry.  No erythema or signs of infection.  Lab Results: CBC:  Recent Labs  04/28/13 0612  WBC 13.0*  HGB 12.6*  HCT 36.1*  PLT 170   BMET:   Recent Labs  04/28/13 0612 04/30/13 0500  NA 137 134*  K 3.9 4.4  CL 101 97  CO2 25 26  GLUCOSE 169* 166*  BUN 19 27*  CREATININE 1.27 1.34  CALCIUM 8.9 9.0    PT/INR:  Lab Results  Component Value Date   INR 1.23 04/25/2013   INR 1.00 04/24/2013   ABG:  INR: Will add last result for INR, ABG once components are confirmed Will add last 4 CBG results once components are confirmed  Assessment/Plan:  1. CV - SR. On Lopressor 25 bid and Lisinopril 2.5 daily. 2.  Pulmonary - Encourage incentive spirometer 3. Volume Overload - On Lasix 40 daily. Will not need at discharge as is below pre op weight. 4.  Acute blood loss  anemia - H and H 12.6 and 36.1 5.DM-CBGs 167/121/154. Continue Metformin 500 bid. Will need follow up as an outpatient as HGA1C 7.8 6.Creatinine with slight increase from 1.27 to 1.34. Will stop Lisinopril  7.Chest tube sutures remain 8.Discharge home  ZIMMERMAN,DONIELLE MPA-C 04/30/2013,7:43 AM

## 2013-04-30 NOTE — Progress Notes (Addendum)
Nursing note 04/30/13 Patient given AVS discharge instructions and paper prescriptions given to patient and family. Medication list reviewed with patient and family and discharge instructions. All questions were answered will discharge home with family. Chest tube sutures were left intact as ordered Aven Cegielski, Belenda Cruise JessupRN

## 2013-05-17 ENCOUNTER — Telehealth: Payer: Self-pay | Admitting: Cardiovascular Disease

## 2013-05-17 NOTE — Telephone Encounter (Signed)
Spoke with patient who states he is having difficulty walking due to swelling and redness to right ankle.  Patient states he stopped taking Lipitor 6 days ago to see if it would help him.  I advised patient that it is unlikely that the Lipitor is causing the swelling and advised him to resume.  Patient's wife got on the phone and stated that the patient had redness and swelling to the right ankle prior to CABG and that Dr. Graciela Husbands saw patient and thought the issue was gout.  Patient was treated with Prednisone to clear the issue before surgery.  Patient's wife states the Prednisone helped some, but she did not believe the issue was gout because it was not like the patient's usual gout symptoms.  Patient has hx diabetes as well.  States the site does not have leakage, is tight and is interfering with the patient's ambulation.  I asked whether they had contacted PCP and wife states he advised they call us because of the recent CABG.  I advised patient and wife that I would send message to Dr. Excell Seltzer for advice.

## 2013-05-17 NOTE — Telephone Encounter (Signed)
New Problem:  Pt states his right ankle is swollen. Pt states they gave him lipitor in the hospital. Pt wants to know if it's the medication. Please advise

## 2013-05-18 NOTE — Telephone Encounter (Signed)
Pt spoke with michelle yesterday regarding his right foot being red and swollen. Pt states he is having difficulty walking.( Please see below note) pt is calling back for MD's recommendations. Pt is aware that i will send this message to Dr. Excell Seltzer for recommendations.

## 2013-05-18 NOTE — Telephone Encounter (Signed)
Follow Up  Pt's wife calling back to follow Up// Please assist.

## 2013-05-19 NOTE — Telephone Encounter (Signed)
Will route to Dr.Cooper and Christoper Fabian

## 2013-05-22 ENCOUNTER — Other Ambulatory Visit: Payer: Self-pay | Admitting: *Deleted

## 2013-05-22 DIAGNOSIS — I2510 Atherosclerotic heart disease of native coronary artery without angina pectoris: Secondary | ICD-10-CM

## 2013-05-23 NOTE — Telephone Encounter (Signed)
Chart reviewed. Pt to see [name] [name] tomorrow 830 am.

## 2013-05-24 ENCOUNTER — Ambulatory Visit (INDEPENDENT_AMBULATORY_CARE_PROVIDER_SITE_OTHER): Payer: Managed Care, Other (non HMO) | Admitting: Physician Assistant

## 2013-05-24 ENCOUNTER — Ambulatory Visit
Admission: RE | Admit: 2013-05-24 | Discharge: 2013-05-24 | Disposition: A | Discharge status: Home / Self Care | Payer: Managed Care, Other (non HMO) | Source: Ambulatory Visit | Attending: Physician Assistant | Admitting: Physician Assistant

## 2013-05-24 ENCOUNTER — Ambulatory Visit
Admission: RE | Admit: 2013-05-24 | Discharge: 2013-05-24 | Disposition: A | Discharge status: Home / Self Care | Payer: Managed Care, Other (non HMO) | Source: Ambulatory Visit | Attending: Surgery | Admitting: Surgery

## 2013-05-24 ENCOUNTER — Encounter: Payer: Self-pay | Admitting: Surgery

## 2013-05-24 ENCOUNTER — Encounter: Payer: Self-pay | Admitting: Physician Assistant

## 2013-05-24 ENCOUNTER — Ambulatory Visit (INDEPENDENT_AMBULATORY_CARE_PROVIDER_SITE_OTHER): Payer: Self-pay | Admitting: Surgery

## 2013-05-24 ENCOUNTER — Telehealth: Payer: Self-pay | Admitting: *Deleted

## 2013-05-24 VITALS — BP 118/74 | HR 66 | Ht 73.0 in | Wt 216.0 lb

## 2013-05-24 VITALS — BP 107/71 | HR 68 | Resp 20 | Ht 73.0 in | Wt 216.0 lb

## 2013-05-24 DIAGNOSIS — M109 Gout, unspecified: Secondary | ICD-10-CM

## 2013-05-24 DIAGNOSIS — M25571 Pain in right ankle and joints of right foot: Secondary | ICD-10-CM

## 2013-05-24 DIAGNOSIS — I2510 Atherosclerotic heart disease of native coronary artery without angina pectoris: Secondary | ICD-10-CM

## 2013-05-24 DIAGNOSIS — M25579 Pain in unspecified ankle and joints of unspecified foot: Secondary | ICD-10-CM

## 2013-05-24 DIAGNOSIS — E785 Hyperlipidemia, unspecified: Secondary | ICD-10-CM

## 2013-05-24 DIAGNOSIS — Z951 Presence of aortocoronary bypass graft: Secondary | ICD-10-CM

## 2013-05-24 DIAGNOSIS — I10 Essential (primary) hypertension: Secondary | ICD-10-CM

## 2013-05-24 DIAGNOSIS — I214 Non-ST elevation (NSTEMI) myocardial infarction: Secondary | ICD-10-CM

## 2013-05-24 DIAGNOSIS — E782 Mixed hyperlipidemia: Secondary | ICD-10-CM | POA: Insufficient documentation

## 2013-05-24 LAB — CBC WITH DIFFERENTIAL/PLATELET
Basophils Absolute: 0 10*3/uL (ref 0.0–0.1)
Basophils Relative: 0.5 % (ref 0.0–3.0)
Eosinophils Relative: 5.8 % — ABNORMAL HIGH (ref 0.0–5.0)
Hemoglobin: 12.4 g/dL — ABNORMAL LOW (ref 13.0–17.0)
Lymphocytes Relative: 23.4 % (ref 12.0–46.0)
Lymphs Abs: 2 10*3/uL (ref 0.7–4.0)
MCV: 89.3 fl (ref 78.0–100.0)
Monocytes Absolute: 0.5 10*3/uL (ref 0.1–1.0)
Monocytes Relative: 6 % (ref 3.0–12.0)
Neutro Abs: 5.4 10*3/uL (ref 1.4–7.7)
RBC: 4.15 Mil/uL — ABNORMAL LOW (ref 4.22–5.81)

## 2013-05-24 MED ORDER — LISINOPRIL 5 MG PO TABS: 2.5000 mg | ORAL_TABLET | Freq: Every day | ORAL | Status: DC

## 2013-05-24 MED ORDER — METOPROLOL TARTRATE 25 MG PO TABS: 25.0000 mg | ORAL_TABLET | Freq: Two times a day (BID) | ORAL | Status: DC

## 2013-05-24 MED ORDER — COLCHICINE 0.6 MG PO TABS: ORAL_TABLET | ORAL | Status: DC

## 2013-05-24 NOTE — Progress Notes (Signed)
[address], [address] [place], [place]  [address] Phone: [phone] Fax:  [phone]  Date:  05/24/2013   ID:  [name] [name], DOB [birth year], MRN [id number]  PCP:  Selinda Flavin, MD  Cardiologist:  Dr. Delane Ginger => Dr. Nona Dell [facility])   History of Present Illness: [name] [name] is a 62 y.o. male with a hx of T2DM, HTN, GERD/PUD.  He was admitted 10/8-10/19 with a NSTEMI.  He originally presented to [facility] with chest pain and was transferred to [facility].  LHC (04/20/13): Proximal LAD 90%, mid LAD 90%, ostial D1 80%, ostial D2 50%, proximal circumflex 99%, AV circumflex occluded, mid RCA 40-50%, distal RCA 50-60%, anterolateral HK, distal inferior HK, EF 45%. Echo (04/20/13): Focal basal hypertrophy, EF 50-55%, mild BAE.  CABG was recommended. Patient underwent CABG with Dr. Laneta Simmers 04/25/13 (LIMA-LAD, SVG-D1, SVG-D2, SVG-OM).  He had an episode of ventricular tachycardia in the ICU.  Otherwise, his postoperative course was fairly uneventful. He did maintain NSR.  Pre-CABG Dopplers: 1-39% bilaterally.  Overall doing well since d/c aside form right ankle pain.  He has some mild chest soreness.  Denies dyspnea, orthopnea, PND, edema, syncope.  He had some medial ankle pain on the R in the hospital that responded to prednisone. This is not as painful as his typical gout pain which typically occurs in his great toe.  Denies fevers, chills, injuries.    Recent Labs: 04/20/2013: ALT 24; HDL 29*; LDL (calc) 82  04/28/2013: Hemoglobin 12.6*  04/30/2013: Creatinine 1.34; Potassium 4.4   Wt Readings from Last 3 Encounters:  04/30/13 224 lb 6.9 oz (101.8 kg)  04/30/13 224 lb 6.9 oz (101.8 kg)  04/30/13 224 lb 6.9 oz (101.8 kg)     Past Medical History  Diagnosis Date  . Diabetes   . HTN (hypertension)   . OSA on CPAP   . PUD (peptic ulcer disease) 1990s  . GERD (gastroesophageal reflux disease)   . Anginal pain 04/18/2013  . NSTEMI (non-ST elevated myocardial  infarction) 04/18/2013    Cath >>3Vdisease  . Gout   . Coronary artery disease     NSTEMI 04/2013 => cath with 3V CAD => CABG with Dr. Laneta Simmers 04/25/13 (LIMA-LAD, SVG-D1, SVG-D2, SVG-OM)  . Hx of echocardiogram     a. LV gram at time of NSTEMI 04/2013:  anterolateral HK, distal inferior HK, EF 45%.  b.  Echo (04/20/13): Focal basal hypertrophy, EF 50-55%, mild BAE    Current Outpatient Prescriptions  Medication Sig Dispense Refill  . aspirin EC 325 MG EC tablet Take 1 tablet (325 mg total) by mouth daily.  30 tablet  0  . atorvastatin (LIPITOR) 80 MG tablet Take 1 tablet (80 mg total) by mouth daily at 6 PM.  30 tablet  1  . metFORMIN (GLUCOPHAGE) 500 MG tablet Take 1 tablet (500 mg total) by mouth 2 (two) times daily with a meal.  60 tablet  1  . metoprolol tartrate (LOPRESSOR) 25 MG tablet Take 1 tablet (25 mg total) by mouth 2 (two) times daily.  60 tablet  1  . Multiple Vitamin (MULTIVITAMIN WITH MINERALS) TABS tablet Take 1 tablet by mouth daily.      Marland Kitchen omeprazole (PRILOSEC) 20 MG capsule Take 20 mg by mouth daily.      Marland Kitchen oxyCODONE (OXY IR/ROXICODONE) 5 MG immediate release tablet Take 1-2 tablets (5-10 mg total) by mouth every 4 (four) hours as needed for pain.  40 tablet  0  No current facility-administered medications for this visit.    Allergies:   Review of patient's allergies indicates no known allergies.   Social History:  The patient  reports that he has never smoked. He has never used smokeless tobacco. He reports that he does not drink alcohol or use illicit drugs.   Family History:  The patient's family history is negative for CAD.  ROS:  Please see the history of present illness.      All other systems reviewed and negative.   PHYSICAL EXAM: VS:  BP 118/74  Pulse 66  Ht 6\' 1"  (1.854 m)  Wt 216 lb (97.977 kg)  BMI 28.50 kg/m2 Well nourished, well developed, in no acute distress HEENT: normal Neck: no JVD Cardiac:  normal S1, S2; RRR; no murmur Chest: Median  sternotomy wound well healed without erythema or discharge Lungs:  clear to auscultation bilaterally, no wheezing, rhonchi or rales Abd: soft, nontender, no hepatomegaly Ext: no edema MSK: Right ankle with mild erythema and edema over the medial malleolus with mild tenderness to palpation; no obvious deformity Skin: warm and dry Neuro:  CNs 2-12 intact, no focal abnormalities noted  EKG:  NSR, HR 66, normal axis, inferior Q waves, T wave inversions in V1-V2     ASSESSMENT AND PLAN:  1. CAD:  Doing well since bypass surgery. Continue aspirin, statin, beta blocker. He would benefit from the addition of an ACE inhibitor given his recent non-STEMI with wall motion abnormalities at the time of his LV gram at cardiac catheterization in addition to his history of diabetes mellitus. He has previously been on lisinopril. I asked him to restart this at 5 mg daily. Check a basic metabolic panel in one week.  He plans to do cardiac rehabilitation at [facility].   2. Hypertension:  Controlled. Add ACE inhibitor as noted. 3. Hyperlipidemia:  Continue statin. Check lipids and LFTs in 6-8 weeks. 4. Diabetes Mellitus:  Follow up with primary care. 5. Right Ankle Pain:  I suspect that this is a gout flare. He would like to avoid steroids as this exacerbates hyperglycemia with his diabetes. I have given him a prescription for colchicine take 1.2 mg x1, then 0.6 mg daily for 7 days. If he cannot afford this, he can use Ibuprofen 600 TID for 5 days.  I have also set him up for a right ankle x-ray, CBC with differential, sedimentation rate and uric acid level. He has been asked to follow up with his PCP within one week. 6. Disposition:  He prefers to follow up in our [place] office as this is closer to his home.  His wife has seen Dr. Nona Dell in the past and he would like to follow up with him.  Arrange f/u with Dr. Diona Browner in [place] in 6-8 weeks.   Signed, Tereso Newcomer, PA-C  05/24/2013 8:19 AM

## 2013-05-24 NOTE — Patient Instructions (Addendum)
If the colchicine is too expensive, you can take Ibuprofen instead. You can take Ibuprofen 600 mg every 8 hours with food for 5 days. Schedule a follow up at Dr. Jeannette How office in about a week to follow up on your ankle pain.   LAB WORK TODAY; CBC W/DIFF, ESR, URIC ACID  FASTING LIPID AND LIVER PANEL TO BE DONE IN 6-8 WEEKS  PLEASE FOLLOW UP WITH DR. MCDOWELL IN THE [place] OFFICE IN 6-8 WEEKS  YOU WILL NEED AN X-RAY OF YOUR RIGHT ANKLE TODAY WHEN YOU HAVE YOUR CHEST X-RAY  YOU HAVE BEEN GIVEN AN RX FOR LAB WORK (BMET) TO BE DONE WITH PCP IN 1 WEEK WITH THE RESULTS TO BE FAXED TO [facility], [facility] [phone]  DECREASE LISINOPRIL 5 MG DAILY

## 2013-05-24 NOTE — Progress Notes (Signed)
HPI:  Patient returns for routine postoperative follow-up having undergone CABG x 4  on 04/25/2013. The patient's early postoperative recovery while in the hospital was notable for an uncomplicated postop course. Since hospital discharge the patient reports that his main complaint has been pain over the right medial malleolus. He does have a history of gout but has never had it in this location. He was just started on colchicine. This has limited his walking but he is doing his best. He denies any chest pain or dyspnea .   Current Outpatient Prescriptions  Medication Sig Dispense Refill  . aspirin EC 325 MG EC tablet Take 1 tablet (325 mg total) by mouth daily.  30 tablet  0  . atorvastatin (LIPITOR) 80 MG tablet Take 1 tablet (80 mg total) by mouth daily at 6 PM.  30 tablet  1  . colchicine 0.6 MG tablet Day one you will take 2 tablets; then starting day 2 you take 1 tab daily for 7 days  16 tablet  0  . lisinopril (PRINIVIL,ZESTRIL) 5 MG tablet Take 5 mg by mouth daily.      . metFORMIN (GLUCOPHAGE) 500 MG tablet Take 1 tablet (500 mg total) by mouth 2 (two) times daily with a meal.  60 tablet  1  . metoprolol tartrate (LOPRESSOR) 25 MG tablet Take 1 tablet (25 mg total) by mouth 2 (two) times daily.  60 tablet  2  . Multiple Vitamin (MULTIVITAMIN WITH MINERALS) TABS tablet Take 1 tablet by mouth daily.      Marland Kitchen omeprazole (PRILOSEC) 20 MG capsule Take 20 mg by mouth daily.       No current facility-administered medications for this visit.    Physical Exam: BP 107/71  Pulse 68  Resp 20  Ht 6\' 1"  (1.854 m)  Wt 216 lb (97.977 kg)  BMI 28.50 kg/m2  SpO2 98% He looks well Cardiac exam shows a regular rate and rhythm with normal heart sounds Lung exam is clear The chest incision is healing well and the sternum is stable There is no peripheral edema. There is mild erythema and tenderness over the right medial malleolus.   Diagnostic Tests:  CLINICAL DATA: 62 year old Male  recently status post CABG.  Subsequent encounter.  EXAM:  CHEST 2 VIEW  COMPARISON: 04/28/2013 and earlier.  FINDINGS:  Sequelae of CABG. Stable cardiac size and mediastinal contours.  Visualized tracheal air column is within normal limits.  Improved lung volumes. Small pleural effusions have resolved.  Improved bibasilar ventilation with resolved atelectasis. No  pulmonary edema or worsening pulmonary opacity. Stable visualized  osseous structures.  IMPRESSION:  No acute cardiopulmonary abnormality.  Resolved small pleural effusions and improved bibasilar ventilation.  Electronically Signed  By: Augusto Gamble M.D.  On: 05/24/2013 10:40    CLINICAL DATA: No trauma, pain medial malleolus  EXAM:  RIGHT ANKLE - 2 VIEW  COMPARISON: None.  FINDINGS:  No fracture, dislocation, or focal osseous abnormality. Tiny ankle  joint effusion. Small calcaneal heel spur. Extensive small vessel  calcification.  IMPRESSION:  No acute findings  Electronically Signed  By: Esperanza Heir M.D.  On: 05/24/2013 11:10    Impression:  Overall he is making a good recovery from surgery. I think he probably has gout involving the right ankle. This should get better with colchicine if it is gout. I told him he could return to driving but should not lift anything heavier than 10 pounds for 3 months postoperatively.   Plan:  He  will continue to follow up with Dr. Excell Seltzer and Dr. Dimas Aguas and will contact me if he develops any problems with his incisions.

## 2013-05-24 NOTE — Telephone Encounter (Signed)
pt notified about x-ray results for ankle and lab results with verbal understanding.

## 2013-05-29 DIAGNOSIS — E7800 Pure hypercholesterolemia, unspecified: Secondary | ICD-10-CM | POA: Insufficient documentation

## 2013-05-31 DIAGNOSIS — Z9889 Other specified postprocedural states: Secondary | ICD-10-CM | POA: Insufficient documentation

## 2013-05-31 DIAGNOSIS — M109 Gout, unspecified: Secondary | ICD-10-CM | POA: Insufficient documentation

## 2013-06-01 ENCOUNTER — Encounter (HOSPITAL_COMMUNITY)
Admission: RE | Admit: 2013-06-01 | Discharge: 2013-06-01 | Disposition: A | Discharge status: Home / Self Care | Payer: Managed Care, Other (non HMO) | Source: Ambulatory Visit | Attending: Cardiology | Admitting: Cardiology

## 2013-06-01 VITALS — BP 100/64 | HR 60 | Ht 73.0 in | Wt 216.2 lb

## 2013-06-01 DIAGNOSIS — Z5189 Encounter for other specified aftercare: Secondary | ICD-10-CM | POA: Insufficient documentation

## 2013-06-01 DIAGNOSIS — I25810 Atherosclerosis of coronary artery bypass graft(s) without angina pectoris: Secondary | ICD-10-CM | POA: Insufficient documentation

## 2013-06-01 NOTE — Progress Notes (Signed)
Patient referred to CR from Dr. Laneta Simmers post CABGx4 414.03. During orientation advised patient on arrival and appointment times what to wear, what to do before, during and after exercise. Reviewed attendance and class policy. Talked about inclement weather and class consultation policy. Pt is scheduled to start Cardiac Rehab on 06/05/13 at 8:15. Pt was advised to come to class 5 minutes before class starts. He was also given instructions on meeting with the dietician and attending the Family Structure classes. Pt is eager to get started. Patient was able to complete 6 minute walk.

## 2013-06-01 NOTE — Patient Instructions (Signed)
Pt has finished orientation and is scheduled to start CR on 06/05/13 at 8:15. Pt has been instructed to arrive to class 15 minutes early for scheduled class. Pt has been instructed to wear comfortable clothing and shoes with rubber soles. Pt has been told to take their medications 1 hour prior to coming to class.  If the patient is not going to attend class, he/she has been instructed to call.

## 2013-06-05 ENCOUNTER — Encounter (HOSPITAL_COMMUNITY)
Admission: RE | Admit: 2013-06-05 | Discharge: 2013-06-05 | Disposition: A | Discharge status: Home / Self Care | Payer: Managed Care, Other (non HMO) | Source: Ambulatory Visit | Attending: Cardiology | Admitting: Cardiology

## 2013-06-07 ENCOUNTER — Encounter (HOSPITAL_COMMUNITY)
Admission: RE | Admit: 2013-06-07 | Discharge: 2013-06-07 | Disposition: A | Discharge status: Home / Self Care | Payer: Managed Care, Other (non HMO) | Source: Ambulatory Visit | Attending: Cardiology | Admitting: Cardiology

## 2013-06-09 ENCOUNTER — Encounter (HOSPITAL_COMMUNITY): Payer: Managed Care, Other (non HMO)

## 2013-06-12 ENCOUNTER — Encounter (HOSPITAL_COMMUNITY)
Admission: RE | Admit: 2013-06-12 | Discharge: 2013-06-12 | Disposition: A | Discharge status: Home / Self Care | Payer: Managed Care, Other (non HMO) | Source: Ambulatory Visit | Attending: Cardiology | Admitting: Cardiology

## 2013-06-12 DIAGNOSIS — I25810 Atherosclerosis of coronary artery bypass graft(s) without angina pectoris: Secondary | ICD-10-CM | POA: Insufficient documentation

## 2013-06-12 DIAGNOSIS — Z5189 Encounter for other specified aftercare: Secondary | ICD-10-CM | POA: Insufficient documentation

## 2013-06-14 ENCOUNTER — Encounter (HOSPITAL_COMMUNITY)
Admission: RE | Admit: 2013-06-14 | Discharge: 2013-06-14 | Disposition: A | Discharge status: Home / Self Care | Payer: Managed Care, Other (non HMO) | Source: Ambulatory Visit | Attending: Cardiology | Admitting: Cardiology

## 2013-06-16 ENCOUNTER — Encounter (HOSPITAL_COMMUNITY)
Admission: RE | Admit: 2013-06-16 | Discharge: 2013-06-16 | Disposition: A | Discharge status: Home / Self Care | Payer: Managed Care, Other (non HMO) | Source: Ambulatory Visit | Attending: Cardiology | Admitting: Cardiology

## 2013-06-19 ENCOUNTER — Encounter (HOSPITAL_COMMUNITY)
Admission: RE | Admit: 2013-06-19 | Discharge: 2013-06-19 | Disposition: A | Discharge status: Home / Self Care | Payer: Managed Care, Other (non HMO) | Source: Ambulatory Visit | Attending: Cardiology | Admitting: Cardiology

## 2013-06-21 ENCOUNTER — Encounter (HOSPITAL_COMMUNITY)
Admission: RE | Admit: 2013-06-21 | Discharge: 2013-06-21 | Disposition: A | Discharge status: Home / Self Care | Payer: Managed Care, Other (non HMO) | Source: Ambulatory Visit | Attending: Cardiology | Admitting: Cardiology

## 2013-06-23 ENCOUNTER — Encounter (HOSPITAL_COMMUNITY)
Admission: RE | Admit: 2013-06-23 | Discharge: 2013-06-23 | Disposition: A | Discharge status: Home / Self Care | Payer: Managed Care, Other (non HMO) | Source: Ambulatory Visit | Attending: Cardiology | Admitting: Cardiology

## 2013-06-26 ENCOUNTER — Encounter (HOSPITAL_COMMUNITY)
Admission: RE | Admit: 2013-06-26 | Discharge: 2013-06-26 | Disposition: A | Discharge status: Home / Self Care | Payer: Managed Care, Other (non HMO) | Source: Ambulatory Visit | Attending: Cardiology | Admitting: Cardiology

## 2013-06-28 ENCOUNTER — Encounter (HOSPITAL_COMMUNITY)
Admission: RE | Admit: 2013-06-28 | Discharge: 2013-06-28 | Disposition: A | Discharge status: Home / Self Care | Payer: Managed Care, Other (non HMO) | Source: Ambulatory Visit | Attending: Cardiology | Admitting: Cardiology

## 2013-06-30 ENCOUNTER — Encounter (HOSPITAL_COMMUNITY)
Admission: RE | Admit: 2013-06-30 | Discharge: 2013-06-30 | Disposition: A | Discharge status: Home / Self Care | Payer: Managed Care, Other (non HMO) | Source: Ambulatory Visit | Attending: Cardiology | Admitting: Cardiology

## 2013-07-03 ENCOUNTER — Encounter (HOSPITAL_COMMUNITY)
Admission: RE | Admit: 2013-07-03 | Discharge: 2013-07-03 | Disposition: A | Discharge status: Home / Self Care | Payer: Managed Care, Other (non HMO) | Source: Ambulatory Visit | Attending: Cardiology | Admitting: Cardiology

## 2013-07-04 ENCOUNTER — Other Ambulatory Visit: Payer: Self-pay

## 2013-07-04 MED ORDER — ATORVASTATIN CALCIUM 80 MG PO TABS: 80.0000 mg | ORAL_TABLET | Freq: Every day | ORAL | Status: DC

## 2013-07-05 ENCOUNTER — Encounter (HOSPITAL_COMMUNITY)
Admission: RE | Admit: 2013-07-05 | Discharge: 2013-07-05 | Disposition: A | Discharge status: Home / Self Care | Payer: Managed Care, Other (non HMO) | Source: Ambulatory Visit | Attending: Cardiology | Admitting: Cardiology

## 2013-07-07 ENCOUNTER — Encounter (HOSPITAL_COMMUNITY)
Admission: RE | Admit: 2013-07-07 | Discharge: 2013-07-07 | Disposition: A | Discharge status: Home / Self Care | Payer: Managed Care, Other (non HMO) | Source: Ambulatory Visit | Attending: Cardiology | Admitting: Cardiology

## 2013-07-10 ENCOUNTER — Encounter (HOSPITAL_COMMUNITY)
Admission: RE | Admit: 2013-07-10 | Discharge: 2013-07-10 | Disposition: A | Discharge status: Home / Self Care | Payer: Managed Care, Other (non HMO) | Source: Ambulatory Visit | Attending: Cardiology | Admitting: Cardiology

## 2013-07-11 ENCOUNTER — Encounter: Payer: Self-pay | Admitting: Physician Assistant

## 2013-07-12 ENCOUNTER — Encounter (HOSPITAL_COMMUNITY): Payer: Managed Care, Other (non HMO)

## 2013-07-14 ENCOUNTER — Encounter (HOSPITAL_COMMUNITY)
Admission: RE | Admit: 2013-07-14 | Discharge: 2013-07-14 | Disposition: A | Discharge status: Home / Self Care | Payer: Managed Care, Other (non HMO) | Source: Ambulatory Visit | Attending: Cardiology | Admitting: Cardiology

## 2013-07-14 DIAGNOSIS — I25810 Atherosclerosis of coronary artery bypass graft(s) without angina pectoris: Secondary | ICD-10-CM | POA: Insufficient documentation

## 2013-07-14 DIAGNOSIS — Z5189 Encounter for other specified aftercare: Secondary | ICD-10-CM | POA: Insufficient documentation

## 2013-07-17 ENCOUNTER — Encounter (HOSPITAL_COMMUNITY)
Admission: RE | Admit: 2013-07-17 | Discharge: 2013-07-17 | Disposition: A | Discharge status: Home / Self Care | Payer: Managed Care, Other (non HMO) | Source: Ambulatory Visit | Attending: Cardiology | Admitting: Cardiology

## 2013-07-19 ENCOUNTER — Encounter (HOSPITAL_COMMUNITY)
Admission: RE | Admit: 2013-07-19 | Discharge: 2013-07-19 | Disposition: A | Discharge status: Home / Self Care | Payer: Managed Care, Other (non HMO) | Source: Ambulatory Visit | Attending: Cardiology | Admitting: Cardiology

## 2013-07-20 ENCOUNTER — Ambulatory Visit (INDEPENDENT_AMBULATORY_CARE_PROVIDER_SITE_OTHER): Payer: Managed Care, Other (non HMO) | Admitting: Cardiology

## 2013-07-20 ENCOUNTER — Encounter: Payer: Self-pay | Admitting: Cardiology

## 2013-07-20 VITALS — BP 108/70 | HR 67 | Ht 73.0 in | Wt 214.0 lb

## 2013-07-20 DIAGNOSIS — I10 Essential (primary) hypertension: Secondary | ICD-10-CM

## 2013-07-20 DIAGNOSIS — I2510 Atherosclerotic heart disease of native coronary artery without angina pectoris: Secondary | ICD-10-CM

## 2013-07-20 DIAGNOSIS — E119 Type 2 diabetes mellitus without complications: Secondary | ICD-10-CM

## 2013-07-20 DIAGNOSIS — E785 Hyperlipidemia, unspecified: Secondary | ICD-10-CM

## 2013-07-20 NOTE — Progress Notes (Signed)
Clinical Summary [name] [name] is a 63 y.o.male presenting for an office visit. This is our first meeting in clinic. He was seen most recently by [name] [name] in November 2014 following CABG by [name] [name] in October 2014. He has been participating in cardiac rehabilitation.  I reviewed his history. He is here with his wife today. He has been doing well, improving mild chest soreness, has not done any overlifting. He states he feels good when he exercises at cardiac rehabilitation. He reports compliance with his medications.  Lab work from December 2014 showed BUN 23, creatinine 1.1, potassium 4.5, AST 16, ALT 17, cholesterol 98, triglycerides 102, HDL 28, LDL 50, hemoglobin 13.9, platelets 185, hemoglobin A1c 6.2.  He works for [employer] as a Designer, multimedia. He tells me that his job does not require any heavy lifting over picking up a shovel. He anticipates returning to work soon, we discussed his typical job activities, and agreed that he should be able to return this month, paperwork completed. He does state that he will be having a [employer] directed physical prior to actually resuming his activities.   No Known Allergies  Current Outpatient Prescriptions  Medication Sig Dispense Refill  . aspirin EC 325 MG EC tablet Take 1 tablet (325 mg total) by mouth daily.  30 tablet  0  . atorvastatin (LIPITOR) 80 MG tablet Take 1 tablet (80 mg total) by mouth daily at 6 PM.  30 tablet  1  . colchicine 0.6 MG tablet Day one you will take 2 tablets; then starting day 2 you take 1 tab daily for 7 days As directed      . lisinopril (PRINIVIL,ZESTRIL) 5 MG tablet Take 5 mg by mouth daily.      . metFORMIN (GLUCOPHAGE) 500 MG tablet Take 1 tablet (500 mg total) by mouth 2 (two) times daily with a meal.  60 tablet  1  . metoprolol tartrate (LOPRESSOR) 25 MG tablet Take 1 tablet (25 mg total) by mouth 2 (two) times daily.  60 tablet  2  . Multiple Vitamin (MULTIVITAMIN WITH MINERALS)  TABS tablet Take 1 tablet by mouth daily.      Marland Kitchen omeprazole (PRILOSEC) 20 MG capsule Take 20 mg by mouth daily.       No current facility-administered medications for this visit.    Past Medical History  Diagnosis Date  . Type 2 diabetes mellitus   . Essential hypertension, benign   . OSA on CPAP   . PUD (peptic ulcer disease)     Diagnosed in the 1990s  . GERD (gastroesophageal reflux disease)   . NSTEMI (non-ST elevated myocardial infarction)     October 2014  . Gout   . Coronary atherosclerosis of native coronary artery     Multivessel status post CABG, LVEF 50-55% (LIMA-LAD, SVG-D1, SVG-D2, SVG-OM)    Past Surgical History  Procedure Laterality Date  . Nasal septum surgery  > 20 yr ago  . Coronary artery bypass graft N/A 04/25/2013    Procedure: CORONARY ARTERY BYPASS GRAFTING (CABG);  Surgeon: [name] Pollack, MD;  Location: [place];  Service: Open Heart Surgery;  Laterality: N/A;  Coronary artery bypass graft times four, on pump, using left internal mammary artery and right greater saphenous vein via endovein harvest.  . Intraoperative transesophageal echocardiogram N/A 04/25/2013    Procedure: INTRAOPERATIVE TRANSESOPHAGEAL ECHOCARDIOGRAM;  Surgeon: [name] Pollack, MD;  Location: [place];  Service: Open Heart Surgery;  Laterality: N/A;  Social History [name] [name] reports that he has never smoked. He has never used smokeless tobacco. [name] [name] reports that he does not drink alcohol.  Review of Systems No palpitations, bleeding problems. Stable appetite. Reasonable sleep. Otherwise as outlined above.  Physical Examination Filed Vitals:   07/20/13 0815  BP: 108/70  Pulse: 67   Filed Weights   07/20/13 0815  Weight: 214 lb (97.07 kg)   Patient appears comfortable at rest. HEENT: Conjunctiva and lids normal, oropharynx clear. Neck: Supple, no elevated JVP or carotid bruits, no thyromegaly. Lungs: Clear to auscultation, nonlabored breathing at rest. Thorax:  Well-healed sternal incision. Cardiac: Regular rate and rhythm, no S3 or significant systolic murmur, no pericardial rub. Abdomen: Soft, nontender, bowel sounds present, no guarding or rebound. Extremities: No pitting edema, distal pulses 2+. Skin: Warm and dry. Musculoskeletal: No kyphosis. Neuropsychiatric: Alert and oriented x3, affect grossly appropriate.   Problem List and Plan   Coronary atherosclerosis of native coronary artery Symptomatically stable with multivessel disease status post CABG in October 2014. He completed 2 months of cardiac rehabilitation. He reports compliance with his medications. 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 He should be able to return this month, paperwork completed for him to return on January 12. He will be having a pre--return to work physical through [employer] as well. I will plan to see him back in the next few months.  Essential hypertension, benign Blood pressure is normal today. Continue current regimen.  HLD (hyperlipidemia) Low HDL, but LDL looks good at 50. Continue Lipitor. Increase activity as tolerated.  Type 2 diabetes mellitus Followed by Dr. Nadara Mustard.    [name], M.D., F.A.C.C.

## 2013-07-20 NOTE — Assessment & Plan Note (Signed)
Symptomatically stable with multivessel disease status post CABG in October 2014. He completed 2 months of cardiac rehabilitation. He reports compliance with his medications. 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 He should be able to return this month, paperwork completed for him to return on January 12. He will be having a pre--return to work physical through [employer] as well. I will plan to see him back in the next few months.

## 2013-07-20 NOTE — Assessment & Plan Note (Signed)
Followed by Dr. Nadara Mustard.

## 2013-07-20 NOTE — Patient Instructions (Signed)
Your physician recommends that you schedule a follow-up appointment in: 2 months.  Your physician recommends that you continue on your current medications as directed. Please refer to the Current Medication list given to you today.  

## 2013-07-20 NOTE — Assessment & Plan Note (Signed)
Blood pressure is normal today. Continue current regimen. 

## 2013-07-20 NOTE — Assessment & Plan Note (Signed)
Low HDL, but LDL looks good at 50. Continue Lipitor. Increase activity as tolerated.

## 2013-07-21 ENCOUNTER — Encounter (HOSPITAL_COMMUNITY)
Admission: RE | Admit: 2013-07-21 | Discharge: 2013-07-21 | Disposition: A | Discharge status: Home / Self Care | Payer: Managed Care, Other (non HMO) | Source: Ambulatory Visit | Attending: Cardiology | Admitting: Cardiology

## 2013-07-21 ENCOUNTER — Telehealth: Payer: Self-pay | Admitting: *Deleted

## 2013-07-21 NOTE — Telephone Encounter (Signed)
pt notified about lab results with verbal understanding  

## 2013-07-24 ENCOUNTER — Encounter (HOSPITAL_COMMUNITY): Payer: Managed Care, Other (non HMO)

## 2013-07-24 ENCOUNTER — Other Ambulatory Visit: Payer: Self-pay | Admitting: Physician Assistant

## 2013-07-25 ENCOUNTER — Other Ambulatory Visit: Payer: Self-pay | Admitting: *Deleted

## 2013-07-25 ENCOUNTER — Other Ambulatory Visit: Payer: Self-pay | Admitting: Physician Assistant

## 2013-07-25 MED ORDER — COLCHICINE 0.6 MG PO TABS: 0.6000 mg | ORAL_TABLET | Freq: Every day | ORAL | Status: DC

## 2013-07-26 ENCOUNTER — Encounter (HOSPITAL_COMMUNITY): Payer: Managed Care, Other (non HMO)

## 2013-07-27 ENCOUNTER — Other Ambulatory Visit: Payer: Self-pay

## 2013-07-27 MED ORDER — COLCHICINE 0.6 MG PO TABS: ORAL_TABLET | ORAL | Status: DC

## 2013-07-27 MED ORDER — COLCHICINE 0.6 MG PO TABS: 0.6000 mg | ORAL_TABLET | Freq: Every day | ORAL | Status: DC

## 2013-07-28 ENCOUNTER — Encounter (HOSPITAL_COMMUNITY): Payer: Managed Care, Other (non HMO)

## 2013-07-31 ENCOUNTER — Encounter (HOSPITAL_COMMUNITY): Payer: Managed Care, Other (non HMO)

## 2013-08-02 ENCOUNTER — Encounter (HOSPITAL_COMMUNITY): Payer: Managed Care, Other (non HMO)

## 2013-08-04 ENCOUNTER — Encounter (HOSPITAL_COMMUNITY): Payer: Managed Care, Other (non HMO)

## 2013-08-07 ENCOUNTER — Encounter (HOSPITAL_COMMUNITY): Payer: Managed Care, Other (non HMO)

## 2013-08-08 NOTE — Progress Notes (Signed)
Cardiac Rehabilitation Program Outcomes Report   Orientation:  06/01/2013 !st week Report:06/12/2013 Graduate Date:  tbd Discharge Date:  tbd # of sessions completed: 3 DX: CABG X 4/ MI  Cardiologist: Grant Ruts MD:  Dionne Milo Time:  08:15  A.  Exercise Program:  Tolerates exercise @ 3.85 METS for 15 minutes and Walk Test Results:  Pre: Pre walk  Test: Reast HR 60, BP 100/64, O2 98%, RPE 7 and RPD 7, 6 minute HR 67 BP 102/68, O2 99%, Rpe 11 and RPD 9, Post HR 56 , BP 90/62 O2 100%, RPE & and RPD 7. Walked 1450 feet at 2.74 mph at 3.1 METS  B.  Mental Health:  Good mental attitude  C.  Education/Instruction/Skills  Accurately checks own pulse.  Rest:  66  Exercise:  94, Knows THR for exercise and Uses Perceived Exertion Scale and/or Dyspnea Scale  Uses Perceived Exertion Scale and/or Dyspnea Scale  D.  Nutrition/Weight Control/Body Composition:  Adherence to prescribed nutrition program: good    E.  Blood Lipids    Lab Results  Component Value Date   CHOL 176 04/20/2013   HDL 29* 04/20/2013   LDLCALC 82 04/20/2013   TRIG 327* 04/20/2013   CHOLHDL 6.1 04/20/2013    F.  Lifestyle Changes:  Making positive lifestyle changes  G.  Symptoms noted with exercise:  Asymptomatic  Report Completed By:  Oletta Lamas. Roshawna Colclasure RN   Comments:  THis is patients 1st week report. He has done well his 1st week. He achieved a peak METS of 3.85. His resting HR was 66 and his resting BP was 100/50, His peak HR was 94 and his peak BP was 120/60. A halfway report will follow upon the patients 18 th visit.

## 2013-08-08 NOTE — Addendum Note (Signed)
Encounter addended by: Norlene Duel, RN on: 08/08/2013  9:31 AM<BR>     Documentation filed: Notes Section

## 2013-08-08 NOTE — Addendum Note (Signed)
Encounter addended by: Norlene Duel, RN on: 08/08/2013  9:32 AM<BR>     Documentation filed: Clinical Notes

## 2013-08-08 NOTE — Progress Notes (Signed)
Cardiac Rehabilitation Program Outcomes Report   Orientation:  06/01/2013 Graduate Date:  NA  Discharge Date:  07/21/2013 # of sessions completed: 19  Cardiologist: Grant Ruts MD:  Rory Percy Class Time:  08.15  A.  Exercise Program:  Tolerates exercise @ 3.85 METS for 15 minutes, Walk Test Results:  Post: Did not perform patient went back to work and did not finish program. and Discharged  B.  Mental Health:  Good mental attitude  C.  Education/Instruction/Skills  Accurately checks own pulse.  Rest:  56  Exercise: 100, Knows THR for exercise, Uses Perceived Exertion Scale and/or Dyspnea Scale and Attended 6 education classes  Uses Perceived Exertion Scale and/or Dyspnea Scale  D.  Nutrition/Weight Control/Body Composition:  Adherence to prescribed nutrition program: good    E.  Blood Lipids    Lab Results  Component Value Date   CHOL 176 04/20/2013   HDL 29* 04/20/2013   LDLCALC 82 04/20/2013   TRIG 327* 04/20/2013   CHOLHDL 6.1 04/20/2013    F.  Lifestyle Changes:  Making positive lifestyle changes and Not smoking:  Quit never smoked  G.  Symptoms noted with exercise:  Asymptomatic  Report Completed By:  Oletta Lamas. Markes Shatswell RN   Comments:  This is patients discharge report. He attended 19 sessions before going back to [employer] and stopping rehab. His last day was 07/21/2013. His resting HR was 56 and resting BP was 118/70. His peak HR was 100 and peak BP was 132/62. He did well while in rehab. A F/U call will be made on patients 1 month, 56months and 1 year to ensure compliance with exercise.

## 2013-08-09 ENCOUNTER — Encounter (HOSPITAL_COMMUNITY): Payer: Managed Care, Other (non HMO)

## 2013-08-11 ENCOUNTER — Encounter (HOSPITAL_COMMUNITY): Payer: Managed Care, Other (non HMO)

## 2013-08-14 ENCOUNTER — Encounter (HOSPITAL_COMMUNITY): Payer: Managed Care, Other (non HMO)

## 2013-08-16 ENCOUNTER — Encounter (HOSPITAL_COMMUNITY): Payer: Managed Care, Other (non HMO)

## 2013-08-18 ENCOUNTER — Encounter (HOSPITAL_COMMUNITY): Payer: Managed Care, Other (non HMO)

## 2013-08-21 ENCOUNTER — Encounter (HOSPITAL_COMMUNITY): Payer: Managed Care, Other (non HMO)

## 2013-08-23 ENCOUNTER — Encounter (HOSPITAL_COMMUNITY): Payer: Managed Care, Other (non HMO)

## 2013-08-25 ENCOUNTER — Encounter (HOSPITAL_COMMUNITY): Payer: Managed Care, Other (non HMO)

## 2013-09-10 ENCOUNTER — Other Ambulatory Visit: Payer: Self-pay | Admitting: Physician Assistant

## 2013-09-18 ENCOUNTER — Encounter: Payer: Self-pay | Admitting: Cardiology

## 2013-09-18 ENCOUNTER — Ambulatory Visit (INDEPENDENT_AMBULATORY_CARE_PROVIDER_SITE_OTHER): Payer: Managed Care, Other (non HMO) | Admitting: Cardiology

## 2013-09-18 VITALS — BP 121/70 | HR 46 | Ht 73.0 in | Wt 203.0 lb

## 2013-09-18 DIAGNOSIS — E785 Hyperlipidemia, unspecified: Secondary | ICD-10-CM

## 2013-09-18 DIAGNOSIS — Z79899 Other long term (current) drug therapy: Secondary | ICD-10-CM

## 2013-09-18 DIAGNOSIS — E119 Type 2 diabetes mellitus without complications: Secondary | ICD-10-CM

## 2013-09-18 DIAGNOSIS — I10 Essential (primary) hypertension: Secondary | ICD-10-CM

## 2013-09-18 DIAGNOSIS — I2510 Atherosclerotic heart disease of native coronary artery without angina pectoris: Secondary | ICD-10-CM

## 2013-09-18 NOTE — Assessment & Plan Note (Signed)
Continue statin therapy, followup FLP and LFT for next visit.

## 2013-09-18 NOTE — Assessment & Plan Note (Signed)
Blood pressure is normal today. 

## 2013-09-18 NOTE — Patient Instructions (Signed)
Your physician recommends that you schedule a follow-up appointment in: 3 months. Your physician recommends that you continue on your current medications as directed. Please refer to the Current Medication list given to you today. Your physician recommends that you have FASTING lipid/liver profile completed just before your next visit.

## 2013-09-18 NOTE — Progress Notes (Signed)
Clinical Summary [name] [name] is a 63 y.o.male last seen in the office in January of this year. He is here with his wife. Back at work, doing well, no angina or shortness of breath. Some mild, resolving thoracic discomfort that is musculoskeletal and postsurgical in description.  We reviewed his medications. He is bradycardic today, but reports no major limitation, not certain that this is symptomatic. He has been working on controlling his blood glucose, has lost weight, paying much more attention to his diet. We did talk about an exercise regimen as well.   No Known Allergies  Current Outpatient Prescriptions  Medication Sig Dispense Refill  . aspirin EC 325 MG EC tablet Take 1 tablet (325 mg total) by mouth daily.  30 tablet  0  . atorvastatin (LIPITOR) 80 MG tablet TAKE ONE TABLET BY MOUTH ONCE DAILY AT 6:00 PM  30 tablet  6  . colchicine 0.6 MG tablet Take 0.6 mg by mouth as needed.      Marland Kitchen escitalopram (LEXAPRO) 10 MG tablet Take 10 mg by mouth daily.      . metFORMIN (GLUCOPHAGE) 500 MG tablet Take 1 tablet (500 mg total) by mouth 2 (two) times daily with a meal.  60 tablet  1  . metoprolol tartrate (LOPRESSOR) 25 MG tablet Take 1 tablet (25 mg total) by mouth 2 (two) times daily.  60 tablet  2  . Multiple Vitamin (MULTIVITAMIN WITH MINERALS) TABS tablet Take 1 tablet by mouth daily.      Marland Kitchen omeprazole (PRILOSEC) 20 MG capsule Take 20 mg by mouth daily.       No current facility-administered medications for this visit.    Past Medical History  Diagnosis Date  . Type 2 diabetes mellitus   . Essential hypertension, benign   . OSA on CPAP   . PUD (peptic ulcer disease)     Diagnosed in the 1990s  . GERD (gastroesophageal reflux disease)   . NSTEMI (non-ST elevated myocardial infarction)     October 2014  . Gout   . Coronary atherosclerosis of native coronary artery     Multivessel status post CABG, LVEF 50-55% (LIMA-LAD, SVG-D1, SVG-D2, SVG-OM)    Past Surgical History    Procedure Laterality Date  . Nasal septum surgery  > 20 yr ago  . Coronary artery bypass graft N/A 04/25/2013    Procedure: CORONARY ARTERY BYPASS GRAFTING (CABG);  Surgeon: [name], MD;  Location: [place];  Service: Open Heart Surgery;  Laterality: N/A;  Coronary artery bypass graft times four, on pump, using left internal mammary artery and right greater saphenous vein via endovein harvest.  . Intraoperative transesophageal echocardiogram N/A 04/25/2013    Procedure: INTRAOPERATIVE TRANSESOPHAGEAL ECHOCARDIOGRAM;  Surgeon: [name], MD;  Location: [facility];  Service: Open Heart Surgery;  Laterality: N/A;    Social History [name] [name] reports that he has never smoked. He has never used smokeless tobacco. [name] [name] reports that he does not drink alcohol.  Review of Systems No palpitations, dizziness, syncope. No orthopnea or PND. No claudication. Otherwise as outlined  Physical Examination Filed Vitals:   09/18/13 1618  BP: 121/70  Pulse: 46   Filed Weights   09/18/13 1618  Weight: 203 lb (92.08 kg)    Patient appears comfortable at rest.  HEENT: Conjunctiva and lids normal, oropharynx clear.  Neck: Supple, no elevated JVP or carotid bruits, no thyromegaly.  Lungs: Clear to auscultation, nonlabored breathing at rest.  Thorax: Well-healed sternal incision.  Cardiac: Regular rate and rhythm, no S3 or significant systolic murmur, no pericardial rub.  Abdomen: Soft, nontender, bowel sounds present, no guarding or rebound.  Extremities: No pitting edema, distal pulses 2+.  Skin: Warm and dry.  Musculoskeletal: No kyphosis.  Neuropsychiatric: Alert and oriented x3, affect grossly appropriate.   Problem List and Plan   Coronary atherosclerosis of native coronary artery Symptomatically stable status post CABG in October 2014. Doing well on medical therapy, back at work without limitation. Continue observation.  Essential hypertension, benign Blood pressure is normal  today.  HLD (hyperlipidemia) Continue statin therapy, followup FLP and LFT for next visit.  Type 2 diabetes mellitus Continues on metformin, keep followup with Dr. Nadara Mustard.    [name], M.D., F.A.C.C.

## 2013-09-18 NOTE — Assessment & Plan Note (Signed)
Continues on metformin, keep followup with Dr. Nadara Mustard.

## 2013-09-18 NOTE — Assessment & Plan Note (Signed)
Symptomatically stable status post CABG in October 2014. Doing well on medical therapy, back at work without limitation. Continue observation.

## 2013-09-19 ENCOUNTER — Telehealth: Payer: Self-pay | Admitting: Cardiology

## 2013-09-19 NOTE — Telephone Encounter (Signed)
Called Dr.Nida office today [phone] to set up appointment for patient. They are closed Until Wednesday. I left a message asking for someone to return the call.

## 2013-09-21 ENCOUNTER — Telehealth: Payer: Self-pay | Admitting: Cardiology

## 2013-09-21 NOTE — Telephone Encounter (Signed)
Referral to Dr. [name] -Referral form faxed to [phone]. Office will review notes and notify us the appointment.

## 2013-09-21 NOTE — Telephone Encounter (Signed)
Received referral form from [facility]. Information has been Completed and faxed to [phone].

## 2013-11-14 ENCOUNTER — Ambulatory Visit: Payer: Managed Care, Other (non HMO)

## 2013-11-21 ENCOUNTER — Ambulatory Visit: Payer: Managed Care, Other (non HMO)

## 2013-11-28 ENCOUNTER — Ambulatory Visit: Payer: Managed Care, Other (non HMO)

## 2013-12-19 ENCOUNTER — Telehealth: Payer: Self-pay | Admitting: Cardiology

## 2013-12-19 NOTE — Telephone Encounter (Signed)
Mr. Crilly had labs done at Dr. Terrace Arabia office . Wants to know if Dr. Domenic Polite wants him to have repeated.

## 2013-12-20 NOTE — Telephone Encounter (Signed)
Per wife, patient had all labs done in April with Dr. Liliane Channel office. Nurse informed her that lab work was requested yesterday from Dr. Liliane Channel office and that she should call them as well and let them know that we are looking for it.

## 2013-12-26 ENCOUNTER — Encounter: Payer: Self-pay | Admitting: Cardiology

## 2013-12-26 ENCOUNTER — Ambulatory Visit (INDEPENDENT_AMBULATORY_CARE_PROVIDER_SITE_OTHER): Payer: Managed Care, Other (non HMO) | Admitting: Cardiology

## 2013-12-26 VITALS — BP 103/61 | HR 57 | Ht 73.0 in | Wt 209.8 lb

## 2013-12-26 DIAGNOSIS — I2510 Atherosclerotic heart disease of native coronary artery without angina pectoris: Secondary | ICD-10-CM

## 2013-12-26 DIAGNOSIS — E785 Hyperlipidemia, unspecified: Secondary | ICD-10-CM

## 2013-12-26 DIAGNOSIS — E119 Type 2 diabetes mellitus without complications: Secondary | ICD-10-CM

## 2013-12-26 DIAGNOSIS — I10 Essential (primary) hypertension: Secondary | ICD-10-CM

## 2013-12-26 MED ORDER — METOPROLOL TARTRATE 25 MG PO TABS: 12.5000 mg | ORAL_TABLET | Freq: Two times a day (BID) | ORAL | Status: DC

## 2013-12-26 MED ORDER — ATORVASTATIN CALCIUM 10 MG PO TABS: 10.0000 mg | ORAL_TABLET | Freq: Every morning | ORAL | Status: DC

## 2013-12-26 NOTE — Assessment & Plan Note (Signed)
Symptomatically stable status post CABG in October 2014. He continues to do very well. Continue medical therapy and observation.

## 2013-12-26 NOTE — Assessment & Plan Note (Signed)
Reviewed recent lipids, agree with reduction in Lipitor 10 mg daily for now.

## 2013-12-26 NOTE — Assessment & Plan Note (Signed)
Blood pressure is normal today. 

## 2013-12-26 NOTE — Progress Notes (Signed)
Clinical Summary Mr. [name] [name] is a 63 y.o.male last seen in March 2015. He is here with his wife. He has been doing very well, has lost weight, reports good control of his diabetes. He is not having any anginal chest pain.  Lab work from April showed cholesterol 85, HDL 36, triglycerides 85, LDL 32, BUN 17, creatinine 1.1, potassium 4.5, hemoglobin A1c 5.8, TSH 0.8. I reviewed his medications, Lipitor has already been cut back 10 mg daily by Dr. [name] due to low LDL.  He has some atypical right lower thoracic discomfort at times, sometimes when he is on that side sleeping. Otherwise no specific complaints.  No Known Allergies  Current Outpatient Prescriptions  Medication Sig Dispense Refill  . aspirin EC 325 MG EC tablet Take 1 tablet (325 mg total) by mouth daily.  30 tablet  0  . atorvastatin (LIPITOR) 10 MG tablet Take 1 tablet (10 mg total) by mouth every morning.  90 tablet  3  . colchicine 0.6 MG tablet Take 0.6 mg by mouth as needed.      Marland Kitchen escitalopram (LEXAPRO) 10 MG tablet Take 10 mg by mouth daily.      . metFORMIN (GLUCOPHAGE) 500 MG tablet Take 1 tablet (500 mg total) by mouth 2 (two) times daily with a meal.  60 tablet  1  . metoprolol tartrate (LOPRESSOR) 25 MG tablet Take 0.5 tablets (12.5 mg total) by mouth 2 (two) times daily.  90 tablet  3  . Multiple Vitamin (MULTIVITAMIN WITH MINERALS) TABS tablet Take 1 tablet by mouth daily.      Marland Kitchen omeprazole (PRILOSEC) 20 MG capsule Take 20 mg by mouth daily.       No current facility-administered medications for this visit.    Past Medical History  Diagnosis Date  . Type 2 diabetes mellitus   . Essential hypertension, benign   . OSA on CPAP   . PUD (peptic ulcer disease)     Diagnosed in the 1990s  . GERD (gastroesophageal reflux disease)   . NSTEMI (non-ST elevated myocardial infarction)     October 2014  . Gout   . Coronary atherosclerosis of native coronary artery     Multivessel status post CABG, LVEF 50-55%  (LIMA-LAD, SVG-D1, SVG-D2, SVG-OM)    Past Surgical History  Procedure Laterality Date  . Nasal septum surgery  > 20 yr ago  . Coronary artery bypass graft N/A 04/25/2013    Procedure: CORONARY ARTERY BYPASS GRAFTING (CABG);  Surgeon: [name], MD;  Location: [facility];  Service: Open Heart Surgery;  Laterality: N/A;  Coronary artery bypass graft times four, on pump, using left internal mammary artery and right greater saphenous vein via endovein harvest.  . Intraoperative transesophageal echocardiogram N/A 04/25/2013    Procedure: INTRAOPERATIVE TRANSESOPHAGEAL ECHOCARDIOGRAM;  Surgeon: [name], MD;  Location: [place];  Service: Open Heart Surgery;  Laterality: N/A;    Social History Mr. Rueth reports that he has never smoked. He has never used smokeless tobacco. Mr. Ryant reports that he does not drink alcohol.  Review of Systems No palpitations, dizziness, syncope. No bleeding gums. No claudication. Other systems reviewed and negative.  Physical Examination Filed Vitals:   12/26/13 1616  BP: 103/61  Pulse: 57   Filed Weights   12/26/13 1616  Weight: 209 lb 12.8 oz (95.165 kg)    Patient appears comfortable at rest.  HEENT: Conjunctiva and lids normal, oropharynx clear.  Neck: Supple, no elevated JVP or carotid bruits,  no thyromegaly.  Lungs: Clear to auscultation, nonlabored breathing at rest.  Thorax: Well-healed sternal incision.  Cardiac: Regular rate and rhythm, no S3 or significant systolic murmur, no pericardial rub.  Abdomen: Soft, nontender, bowel sounds present, no guarding or rebound.  Extremities: No pitting edema, distal pulses 2+.  Skin: Warm and dry.  Musculoskeletal: No kyphosis.  Neuropsychiatric: Alert and oriented x3, affect grossly appropriate.   Problem List and Plan   Coronary atherosclerosis of native coronary artery Symptomatically stable status post CABG in October 2014. He continues to do very well. Continue medical therapy and  observation.  HLD (hyperlipidemia) Reviewed recent lipids, agree with reduction in Lipitor 10 mg daily for now.  Essential hypertension, benign Blood pressure is normal today.  Type 2 diabetes mellitus Keep followup with Dr. [name].    [name], M.D., F.A.C.C.

## 2013-12-26 NOTE — Patient Instructions (Signed)
   Decrease Lopressor to 12.5mg  twice a day Continue all other medications.   Your physician wants you to follow up in: 6 months.  You will receive a reminder letter in the mail one-two months in advance.  If you don't receive a letter, please call our office to schedule the follow up appointment

## 2013-12-26 NOTE — Assessment & Plan Note (Signed)
Keep followup with Dr. [name].

## 2014-06-21 ENCOUNTER — Encounter (HOSPITAL_COMMUNITY): Payer: Self-pay | Admitting: Cardiovascular Disease

## 2014-07-03 ENCOUNTER — Ambulatory Visit: Payer: Managed Care, Other (non HMO) | Admitting: Cardiology

## 2014-07-10 ENCOUNTER — Telehealth: Payer: Self-pay | Admitting: Cardiology

## 2014-07-10 NOTE — Telephone Encounter (Signed)
[name] [name] lost his insurance. He was taking Prilosec 20 mg over the counter which was costing him approximately $24.00. He is wanting to know If we could call in Rx for Zantac at [facility] which would only cost him $4.00 per prescription.  [name]

## 2014-07-11 NOTE — Telephone Encounter (Signed)
Patient notified that it does not appear that we have ever prescribed Prilosec for him before.  Stated that he thought that Dr. Nadara Mustard initially did this.  Advised patient to contact PMD regarding this request as he would be best to manage reflux issue.  Patient verbalized understanding.

## 2014-07-23 ENCOUNTER — Telehealth: Payer: Self-pay | Admitting: *Deleted

## 2014-07-23 MED ORDER — LOVASTATIN 20 MG PO TABS: 20.0000 mg | ORAL_TABLET | Freq: Every day | ORAL | Status: DC

## 2014-07-23 NOTE — Telephone Encounter (Signed)
My assumption is that he has checked on the cost of generic lovastatin and that it is cheaper. If he would like to switch, change to lovastatin 20 mg daily.

## 2014-07-23 NOTE — Telephone Encounter (Signed)
Patient lost insurance and is requesting that his atorvastatin be changed to lovastatin until he gets insurance again. Please advise.

## 2014-07-23 NOTE — Telephone Encounter (Signed)
Per wife, its cheaper. Wife notified that prescription would be switched.

## 2014-09-04 ENCOUNTER — Encounter: Payer: Self-pay | Admitting: Cardiology

## 2014-09-04 ENCOUNTER — Ambulatory Visit (INDEPENDENT_AMBULATORY_CARE_PROVIDER_SITE_OTHER): Payer: Managed Care, Other (non HMO) | Admitting: Cardiology

## 2014-09-04 VITALS — BP 116/64 | HR 58 | Ht 73.0 in | Wt 216.4 lb

## 2014-09-04 DIAGNOSIS — E119 Type 2 diabetes mellitus without complications: Secondary | ICD-10-CM

## 2014-09-04 DIAGNOSIS — I2510 Atherosclerotic heart disease of native coronary artery without angina pectoris: Secondary | ICD-10-CM

## 2014-09-04 DIAGNOSIS — E782 Mixed hyperlipidemia: Secondary | ICD-10-CM

## 2014-09-04 DIAGNOSIS — I10 Essential (primary) hypertension: Secondary | ICD-10-CM

## 2014-09-04 NOTE — Progress Notes (Signed)
Cardiology Office Note  Date: 09/04/2014   ID: SOMA LIZAK, DOB [birth year], MRN [id number]  PCP: Rory Percy, MD  Primary Cardiologist: [name], MD   Chief Complaint  Patient presents with  . Coronary Artery Disease  . Hypertension    History of Present Illness: [name] [name] is a 64 y.o. male last seen in June 2015. He presents for a routine visit today. From a cardiac perspective he has done well, no angina symptoms or increasing shortness of breath. Follow-up ECG is reviewed below.  We reviewed his medications, he is on aspirin, Mevacor, and Lopressor. Recent lipid panel reviewed. He continues to follow with endocrinology and also Dr. Nadara Mustard, just had a recent physical.  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 He reports a lot less stress in his life.  Past Medical History  Diagnosis Date  . Type 2 diabetes mellitus   . Essential hypertension, benign   . OSA on CPAP   . PUD (peptic ulcer disease)     Diagnosed in the 1990s  . GERD (gastroesophageal reflux disease)   . NSTEMI (non-ST elevated myocardial infarction)     October 2014  . Gout   . Coronary atherosclerosis of native coronary artery     Multivessel status post CABG, LVEF 50-55% (LIMA-LAD, SVG-D1, SVG-D2, SVG-OM)    Past Surgical History  Procedure Laterality Date  . Nasal septum surgery  > 20 yr ago  . Coronary artery bypass graft N/A 04/25/2013    Procedure: CORONARY ARTERY BYPASS GRAFTING (CABG);  Surgeon: [name], MD;  Location: [place];  Service: Open Heart Surgery;  Laterality: N/A;  Coronary artery bypass graft times four, on pump, using left internal mammary artery and right greater saphenous vein via endovein harvest.  . Intraoperative transesophageal echocardiogram N/A 04/25/2013    Procedure: INTRAOPERATIVE TRANSESOPHAGEAL ECHOCARDIOGRAM;  Surgeon: [name], MD;  Location: [place];  Service: Open Heart Surgery;  Laterality:  N/A;  . Left heart catheterization with coronary angiogram N/A 04/20/2013    Procedure: LEFT HEART CATHETERIZATION WITH CORONARY ANGIOGRAM;  Surgeon: [name], MD;  Location: [facility] CATH LAB;  Service: Cardiovascular;  Laterality: N/A;    Current Outpatient Prescriptions  Medication Sig Dispense Refill  . aspirin EC 325 MG EC tablet Take 1 tablet (325 mg total) by mouth daily. 30 tablet 0  . colchicine 0.6 MG tablet Take 0.6 mg by mouth as needed.    Marland Kitchen escitalopram (LEXAPRO) 10 MG tablet Take 10 mg by mouth daily.    Marland Kitchen lovastatin (MEVACOR) 20 MG tablet Take 1 tablet (20 mg total) by mouth at bedtime. 30 tablet 6  . metFORMIN (GLUCOPHAGE) 500 MG tablet Take 1 tablet (500 mg total) by mouth 2 (two) times daily with a meal. 60 tablet 1  . metoprolol tartrate (LOPRESSOR) 25 MG tablet Take 0.5 tablets (12.5 mg total) by mouth 2 (two) times daily. 90 tablet 3  . Multiple Vitamin (MULTIVITAMIN WITH MINERALS) TABS tablet Take 1 tablet by mouth daily.    . ranitidine (ZANTAC) 150 MG tablet Take 150 mg by mouth 2 (two) times daily.     No current facility-administered medications for this visit.    Allergies:  Review of patient's allergies indicates no known allergies.   Social History: The patient  reports that he has never smoked. He has never used smokeless tobacco. He reports that he does not drink alcohol or use illicit  drugs.   ROS:  Please see the history of present illness. Otherwise, complete review of systems is positive for none.  All other systems are reviewed and negative.    Physical Exam: VS:  BP 116/64 mmHg  Pulse 58  Ht 6\' 1"  (1.854 m)  Wt 216 lb 6.4 oz (98.158 kg)  BMI 28.56 kg/m2  SpO2 98%, BMI Body mass index is 28.56 kg/(m^2).  Wt Readings from Last 3 Encounters:  09/04/14 216 lb 6.4 oz (98.158 kg)  12/26/13 209 lb 12.8 oz (95.165 kg)  09/18/13 203 lb (92.08 kg)     Patient appears comfortable at rest.  HEENT: Conjunctiva and lids normal, oropharynx clear.   Neck: Supple, no elevated JVP or carotid bruits, no thyromegaly.  Lungs: Clear to auscultation, nonlabored breathing at rest.  Thorax: Well-healed sternal incision.  Cardiac: Regular rate and rhythm, no S3 or significant systolic murmur, no pericardial rub.  Abdomen: Soft, nontender, bowel sounds present, no guarding or rebound.  Extremities: No pitting edema, distal pulses 2+.  Skin: Warm and dry.  Musculoskeletal: No kyphosis.  Neuropsychiatric: Alert and oriented x3, affect grossly appropriate.   ECG: ECG is ordered today and reviewed finding sinus bradycardia with prolonged PR interval, lead motion artifact and nonspecific T-wave changes.   Recent Labwork:  Labwork from December 2015 showed cholesterol 129, HDL 35, triglycerides 143, LDL 65, BUN 20, currently 1.2, potassium 4.3, AST 17, ALT 20, hemoglobin A1c 6.0.  Other Studies Reviewed Today:  Echocardiogram from October 2014 reported mild basal septal hypertrophy with LVEF 50-55%, mild biatrial enlargement, no major valvular abnormalities.   Assessment and Plan:  1. Clinically stable with history of multivessel CAD status post CABG in 2014. ECG reviewed. Continue current medical regimen, encouraged regular exercise plan.  2. Type 2 diabetes mellitus, followed by endocrinology. Recent hemoglobin A1c 6.0.  3. Essential hypertension, blood pressure well controlled today.  4. Hyperlipidemia, on statin therapy, recent LDL 65.  Current medicines are reviewed at length with the patient today.  The patient does not have concerns regarding medicines.   Orders Placed This Encounter  Procedures  . EKG 12-Lead    Disposition: FU with me in 6 months.   Signed, [name], MD, [facility] 09/04/2014 4:46 PM    [facility] [place], [place], [place] [address] Phone: [phone]; Fax: [phone]

## 2014-09-04 NOTE — Patient Instructions (Signed)

## 2014-10-29 ENCOUNTER — Telehealth: Payer: Self-pay | Admitting: Cardiology

## 2014-10-29 NOTE — Telephone Encounter (Signed)
Will forward to provider  

## 2014-10-29 NOTE — Telephone Encounter (Signed)
Patient is wanting to know if he can give blood.

## 2014-10-30 NOTE — Telephone Encounter (Signed)
Wife [name] [name]) notified.

## 2014-10-30 NOTE — Telephone Encounter (Signed)
He was doing well at recent visit. Does not have any specific physical restrictions at this time that would limit his ability to give blood, however there are sometimes some restrictions related to medications and donating blood. He would need to check with the donation center about his current medications to see if there are any problems.

## 2015-01-28 ENCOUNTER — Telehealth: Payer: Self-pay | Admitting: Cardiology

## 2015-01-28 MED ORDER — METOPROLOL TARTRATE 25 MG PO TABS: 12.5000 mg | ORAL_TABLET | Freq: Two times a day (BID) | ORAL | Status: DC

## 2015-01-28 NOTE — Telephone Encounter (Signed)
metoprolol tartrate (LOPRESSOR) 25 MG tablet Needs refill sent to [facility]  -  Patient has been out over a week

## 2015-03-27 ENCOUNTER — Telehealth: Payer: Self-pay | Admitting: *Deleted

## 2015-03-27 NOTE — Telephone Encounter (Signed)
Returned call

## 2015-03-27 NOTE — Telephone Encounter (Signed)
-----   Message from Chanda Busing sent at 03/26/2015 10:39 AM EDT ----- Regarding: upcoming appt.    Labs  Patient is coming to see Dr. Domenic Polite on 9-30 He thinks he needs to have cholesterol checked. Please check and patient would like a telephone call if possible as to yes or no.

## 2015-03-27 NOTE — Telephone Encounter (Addendum)
Wife informed that no lab work has been ordered at this time.

## 2015-04-02 ENCOUNTER — Other Ambulatory Visit: Payer: Self-pay | Admitting: Cardiology

## 2015-04-10 ENCOUNTER — Encounter: Payer: Self-pay | Admitting: *Deleted

## 2015-04-12 ENCOUNTER — Ambulatory Visit (INDEPENDENT_AMBULATORY_CARE_PROVIDER_SITE_OTHER): Payer: Managed Care, Other (non HMO) | Admitting: Cardiology

## 2015-04-12 ENCOUNTER — Telehealth: Payer: Self-pay | Admitting: "Endocrinology

## 2015-04-12 ENCOUNTER — Encounter: Payer: Self-pay | Admitting: "Endocrinology

## 2015-04-12 ENCOUNTER — Encounter: Payer: Self-pay | Admitting: Cardiology

## 2015-04-12 VITALS — BP 112/70 | HR 53 | Ht 73.0 in | Wt 212.1 lb

## 2015-04-12 DIAGNOSIS — E782 Mixed hyperlipidemia: Secondary | ICD-10-CM

## 2015-04-12 DIAGNOSIS — I2510 Atherosclerotic heart disease of native coronary artery without angina pectoris: Secondary | ICD-10-CM

## 2015-04-12 DIAGNOSIS — I10 Essential (primary) hypertension: Secondary | ICD-10-CM

## 2015-04-12 DIAGNOSIS — E119 Type 2 diabetes mellitus without complications: Secondary | ICD-10-CM

## 2015-04-12 NOTE — Telephone Encounter (Signed)
Maudie Mercury would you respond accordingly. thank you.

## 2015-04-12 NOTE — Patient Instructions (Signed)
Your physician recommends that you continue on your current medications as directed. Please refer to the Current Medication list given to you today. Your physician recommends that you schedule a follow-up appointment in: 6 months. You will receive a reminder letter in the mail in about months reminding you to call and schedule your appointment. If you don't receive this letter, please contact our office.

## 2015-04-12 NOTE — Progress Notes (Signed)
Cardiology Office Note  Date: 04/12/2015   ID: [name] [name], DOB 09/12/1950, MRN [id number]  PCP: Rory Percy, MD  Primary Cardiologist: [name], MD   Chief Complaint  Patient presents with  . Coronary Artery Disease    History of Present Illness: [name] [name] is a 65 y.o. male last seen in February. He presents for a routine follow-up visit. No reported angina symptoms or nitroglycerin use. He still works part time at a [employer]. Also enjoys traveling on his motorcycle, and plans to go to [place] next year.  We reviewed his medications which are outlined below. He reports no obvious side effects. States that he had lab work with Dr. [name] within the last few months, this will be requested. His lipids have generally been well controlled.  ECG from earlier this years reviewed below.   Past Medical History  Diagnosis Date  . Type 2 diabetes mellitus   . Essential hypertension, benign   . OSA on CPAP   . PUD (peptic ulcer disease)     Diagnosed in the 1990s  . GERD (gastroesophageal reflux disease)   . NSTEMI (non-ST elevated myocardial infarction)     October 2014  . Gout   . Coronary atherosclerosis of native coronary artery     Multivessel status post CABG, LVEF 50-55% (LIMA-LAD, SVG-D1, SVG-D2, SVG-OM)    Past Surgical History  Procedure Laterality Date  . Nasal septum surgery  > 20 yr ago  . Coronary artery bypass graft N/A 04/25/2013    Procedure: CORONARY ARTERY BYPASS GRAFTING (CABG);  Surgeon: [name], MD;  Location: [place];  Service: Open Heart Surgery;  Laterality: N/A;  Coronary artery bypass graft times four, on pump, using left internal mammary artery and right greater saphenous vein via endovein harvest.  . Intraoperative transesophageal echocardiogram N/A 04/25/2013    Procedure: INTRAOPERATIVE TRANSESOPHAGEAL ECHOCARDIOGRAM;  Surgeon: [name], MD;  Location: [place];  Service: Open Heart Surgery;  Laterality: N/A;  . Left  heart catheterization with coronary angiogram N/A 04/20/2013    Procedure: LEFT HEART CATHETERIZATION WITH CORONARY ANGIOGRAM;  Surgeon: [name], MD;  Location: [facility] CATH LAB;  Service: Cardiovascular;  Laterality: N/A;    Current Outpatient Prescriptions  Medication Sig Dispense Refill  . aspirin EC 325 MG EC tablet Take 1 tablet (325 mg total) by mouth daily. 30 tablet 0  . colchicine 0.6 MG tablet Take 0.6 mg by mouth as needed.    Marland Kitchen escitalopram (LEXAPRO) 10 MG tablet Take 10 mg by mouth daily.    Marland Kitchen lovastatin (MEVACOR) 20 MG tablet TAKE ONE TABLET BY MOUTH ONCE DAILY AT BEDTIME **STOP  TAKING  ATORVASTATIN** 30 tablet 6  . metFORMIN (GLUCOPHAGE) 500 MG tablet Take 1 tablet (500 mg total) by mouth 2 (two) times daily with a meal. 60 tablet 1  . metoprolol tartrate (LOPRESSOR) 25 MG tablet Take 0.5 tablets (12.5 mg total) by mouth 2 (two) times daily. 90 tablet 6  . Multiple Vitamin (MULTIVITAMIN WITH MINERALS) TABS tablet Take 1 tablet by mouth daily.    Marland Kitchen omeprazole (PRILOSEC) 20 MG capsule Take 20 mg by mouth daily.     No current facility-administered medications for this visit.    Allergies:  Review of patient's allergies indicates no known allergies.   Social History: The patient  reports that he has never smoked. He has never used smokeless tobacco. He reports that he does not drink alcohol or use illicit drugs.  ROS:  Please see the history of present illness. Otherwise, complete review of systems is positive for intermittent gout flares requiring colchicine.  All other systems are reviewed and negative.   Physical Exam: VS:  BP 112/70 mmHg  Pulse 53  Ht 6\' 1"  (1.854 m)  Wt 212 lb 1.9 oz (96.217 kg)  BMI 27.99 kg/m2  SpO2 98%, BMI Body mass index is 27.99 kg/(m^2).  Wt Readings from Last 3 Encounters:  04/12/15 212 lb 1.9 oz (96.217 kg)  09/04/14 216 lb 6.4 oz (98.158 kg)  12/26/13 209 lb 12.8 oz (95.165 kg)    Patient appears comfortable at rest.  HEENT:  Conjunctiva and lids normal, oropharynx clear.  Neck: Supple, no elevated JVP or carotid bruits, no thyromegaly.  Lungs: Clear to auscultation, nonlabored breathing at rest.  Thorax: Well-healed sternal incision.  Cardiac: Regular rate and rhythm, no S3 or significant systolic murmur, no pericardial rub.  Abdomen: Soft, nontender, bowel sounds present.  Extremities: No pitting edema, distal pulses 2+.  Skin: Warm and dry.  Musculoskeletal: No kyphosis.  Neuropsychiatric: Alert and oriented x3, affect grossly appropriate.   ECG: Tracing from 09/04/2014 showed sinus bradycardia with prolonged PR interval, low voltage in the limb leads, and nonspecific T-wave changes.  Recent Labwork:  December 2015: Cholesterol 129, HDL 35, triglycerides 143, LDL 65, BUN 20, creatinine 1.2, potassium 4.3, AST 17, ALT 20, hemoglobin A1c 6.0  Other Studies Reviewed Today:  Echocardiogram from October 2014 reported mild basal septal hypertrophy with LVEF 50-55%, mild biatrial enlargement, no major valvular abnormalities.   Assessment and Plan:  1. Multivessel CAD status post CABG in 2014. He is symptomatically stable on current medical regimen. No changes were made today. We continue observation.  2. Hyperlipidemia, on lovastatin. LDL was 65 and December 2015. Requesting most recent lab work.  3. Type 2 diabetes mellitus, followed by Dr. [name].  4. Essential hypertension, blood pressure is normal today. Keep follow-up with Dr. Nadara Mustard.  Current medicines were reviewed with the patient today.  Disposition: FU with me in 6 months.   Signed, [name], MD, [facility] 04/12/2015 8:29 AM    [facility] [place], [place], [place] [address] Phone: [phone]; Fax: [phone]

## 2015-04-12 NOTE — Telephone Encounter (Signed)
Maudie Mercury would do respond accordingly. Thank you.

## 2015-04-15 NOTE — Telephone Encounter (Signed)
?   Should there be an attachment?

## 2015-04-23 IMAGING — CR DG CHEST 1V PORT
1 series · 1 of 1 positions shown · non-contrast
Comparison: 04/25/2013; 04/18/2013

CLINICAL DATA: Postop CABG, subsequent encounter.

PORTABLE CHEST - 1 VIEW

[AP]
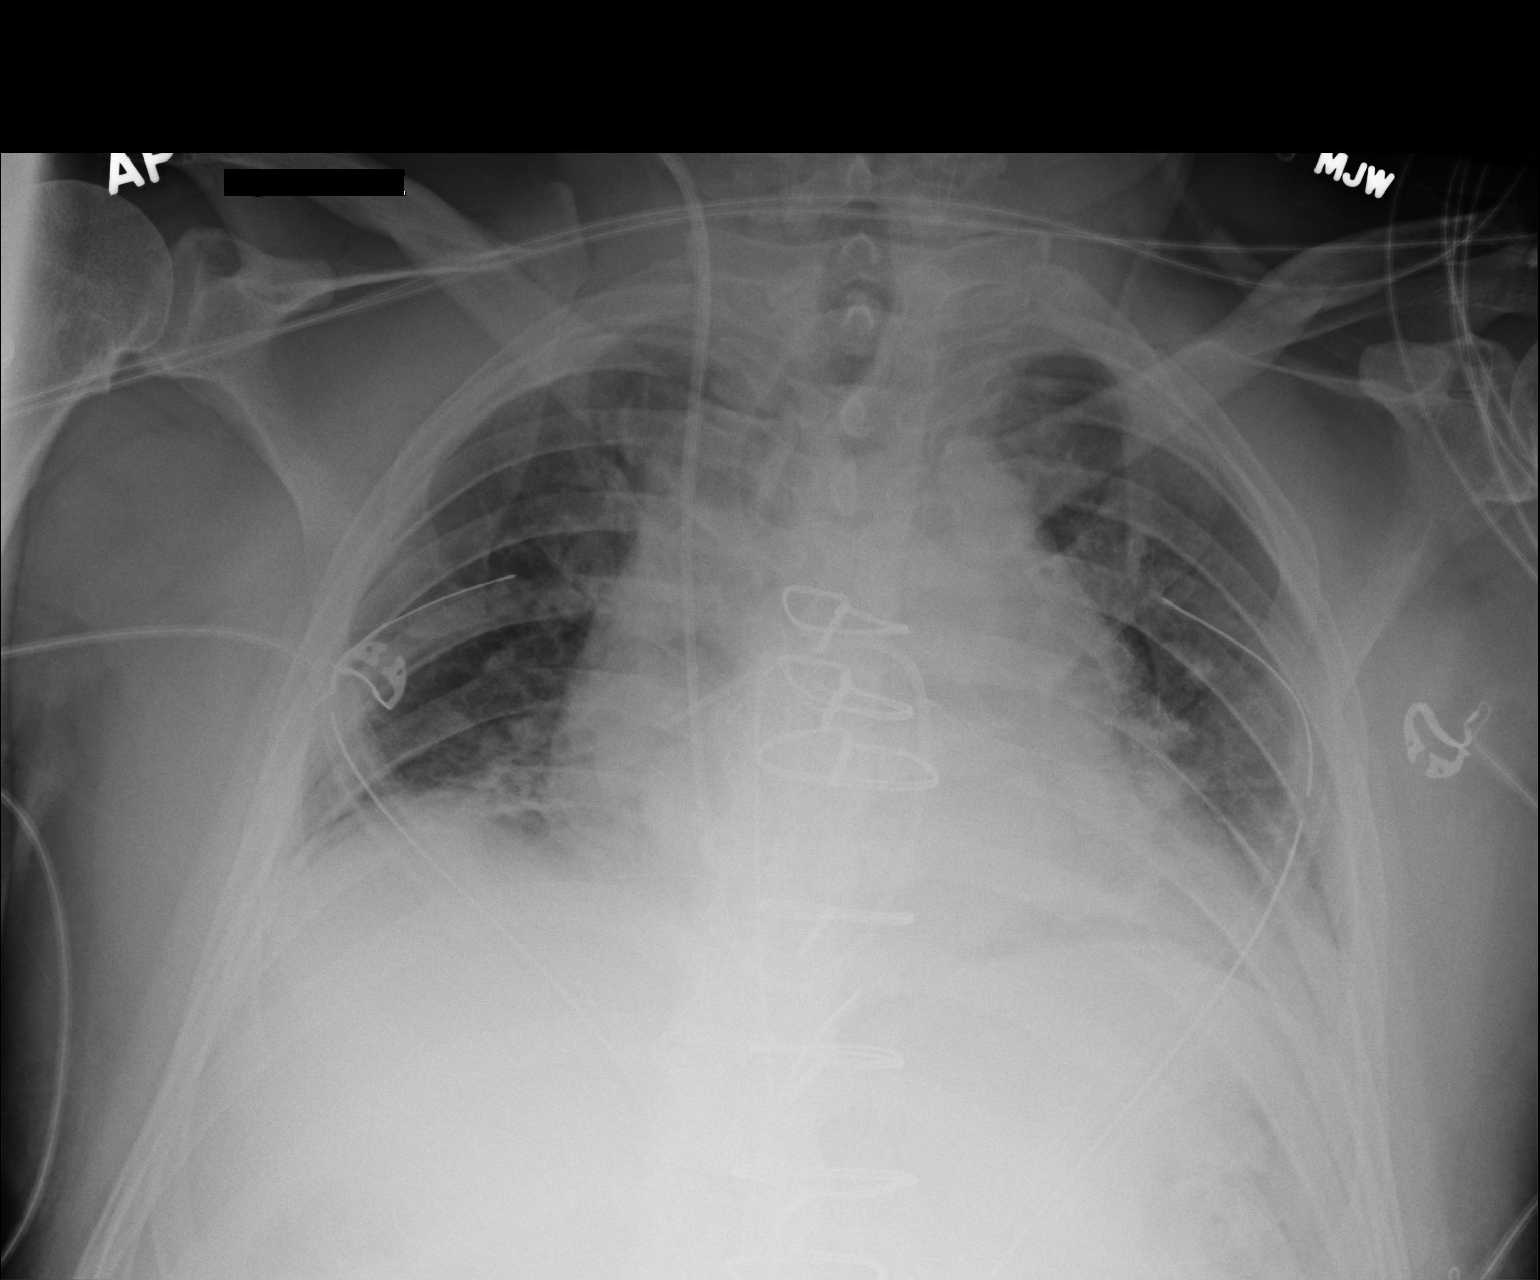

[1 of 1 positions shown; findings below may reference images not displayed]

FINDINGS: Grossly unchanged enlarged cardiac silhouette and mediastinal
contours given decreased lung volumes.  Interval extubation and
removal of enteric tube.  Otherwise, stable positioning of
remaining support apparatus.  No pneumothorax.  Persistent mild
cephalization of flow without evidence of edema.  Worsening
bibasilar opacities, likely atelectasis.  Trace bilateral effusions
are suspected.  Unchanged bones.
IMPRESSION: 1.  Interval extubation and removal of enteric tube.  Otherwise,
stable positioning of remaining support apparatus.  No
pneumothorax.
2.  Decreased lung volumes with worsening bibasilar atelectasis.
3.  Pulmonary venous congestion without frank evidence of edema.

## 2015-04-24 IMAGING — CR DG CHEST 1V PORT
1 series · 1 of 1 positions shown · non-contrast
Comparison: 04/26/2013

CLINICAL DATA: Post cardiac surgery.

EXAM:
PORTABLE CHEST - 1 VIEW

[AP]
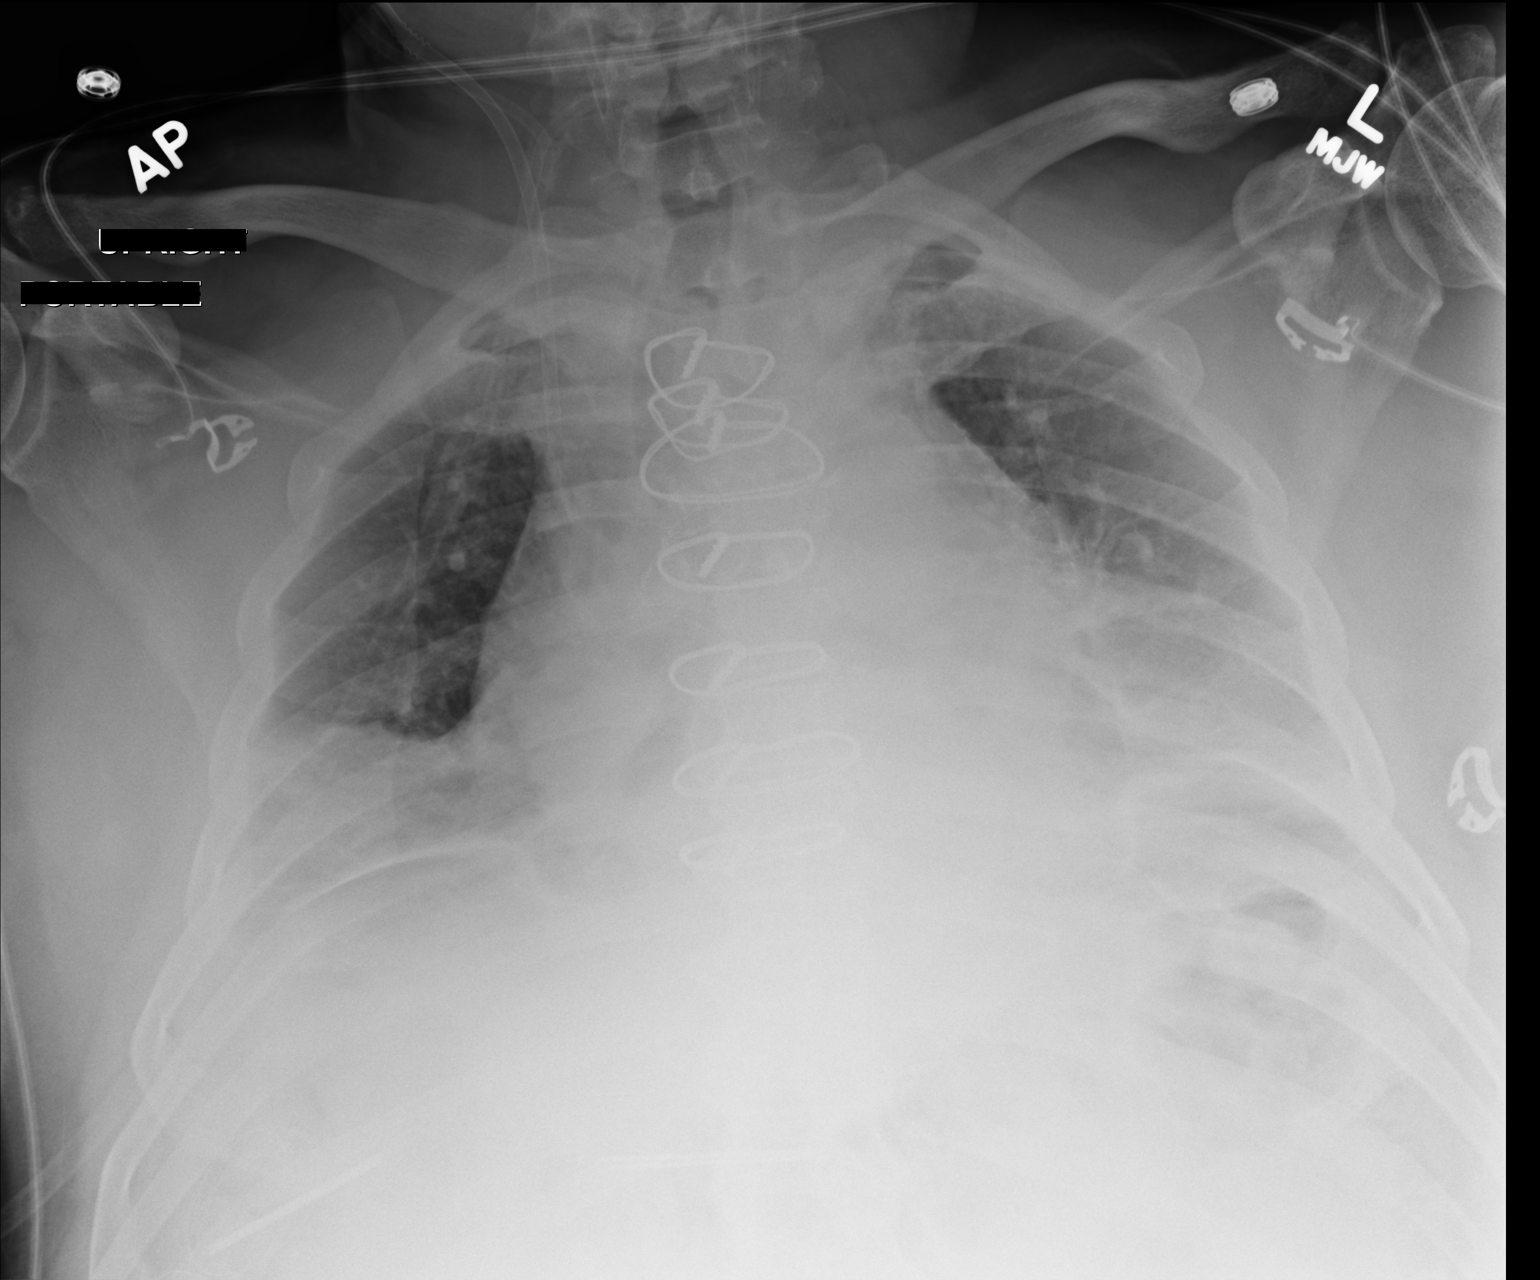

[1 of 1 positions shown; findings below may reference images not displayed]

FINDINGS: Prior median sternotomy. Interval removal of Swan-Ganz catheter,
mediastinal drain and bilateral chest tubes. A right IJ Cordis
sheath remains in place.

Cardiomegaly accentuated by AP portable technique. Small bilateral
pleural effusions are suspected. No pneumothorax. Diminished lung
volumes with worsened bibasilar airspace disease.
IMPRESSION: Removal support apparatus, including bilateral chest tubes ; no
pneumothorax.

Diminished lung volumes with increased bibasilar Airspace disease,
likely atelectasis.

Probable small bilateral pleural effusions.

## 2015-04-25 IMAGING — CR DG CHEST 2V
2 series · 2 of 2 positions shown · non-contrast
Comparison: April 27, 2013

CLINICAL DATA: Atelectasis; recent coronary artery bypass grafting

CHEST - 2 VIEW

[w chest pa]
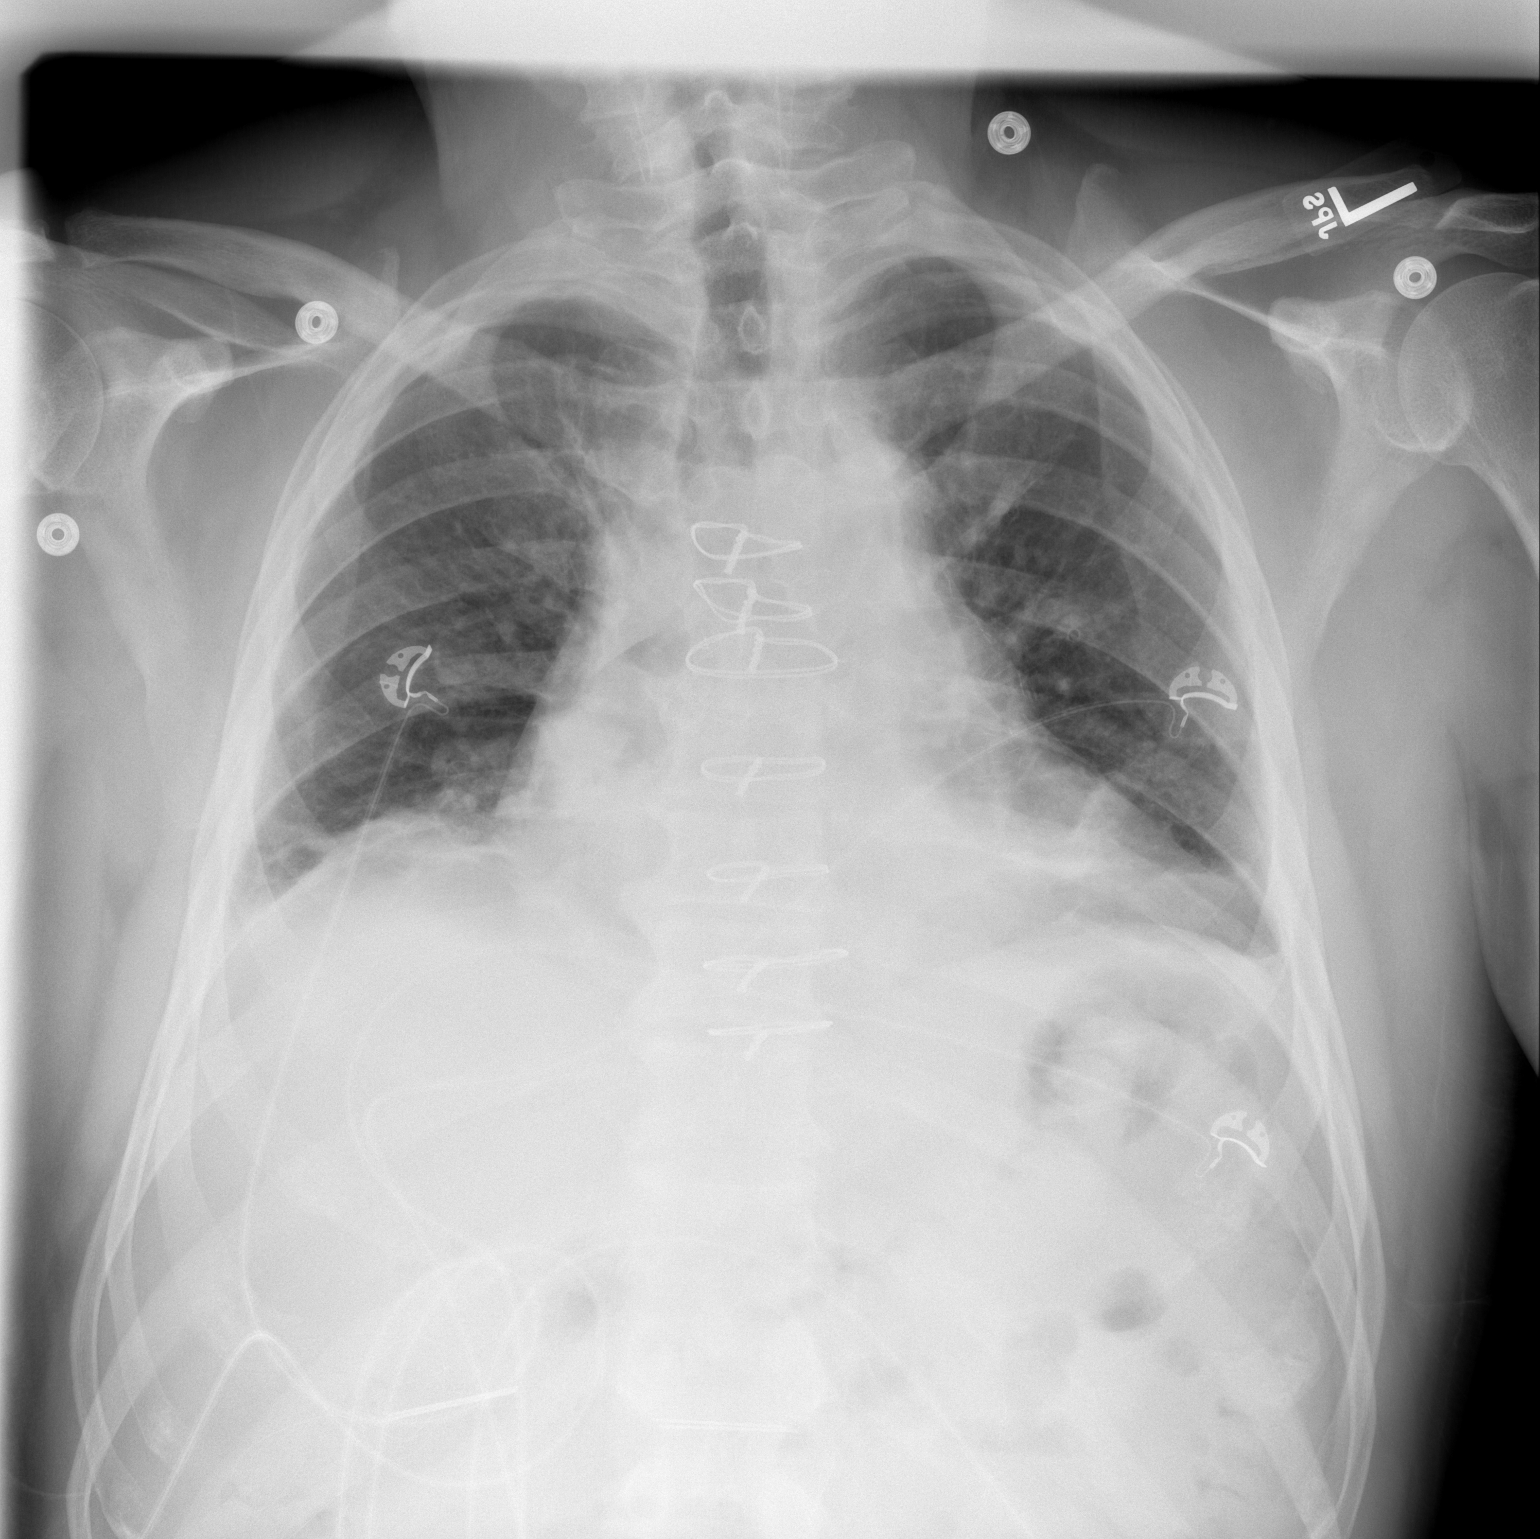

[w chest lat]
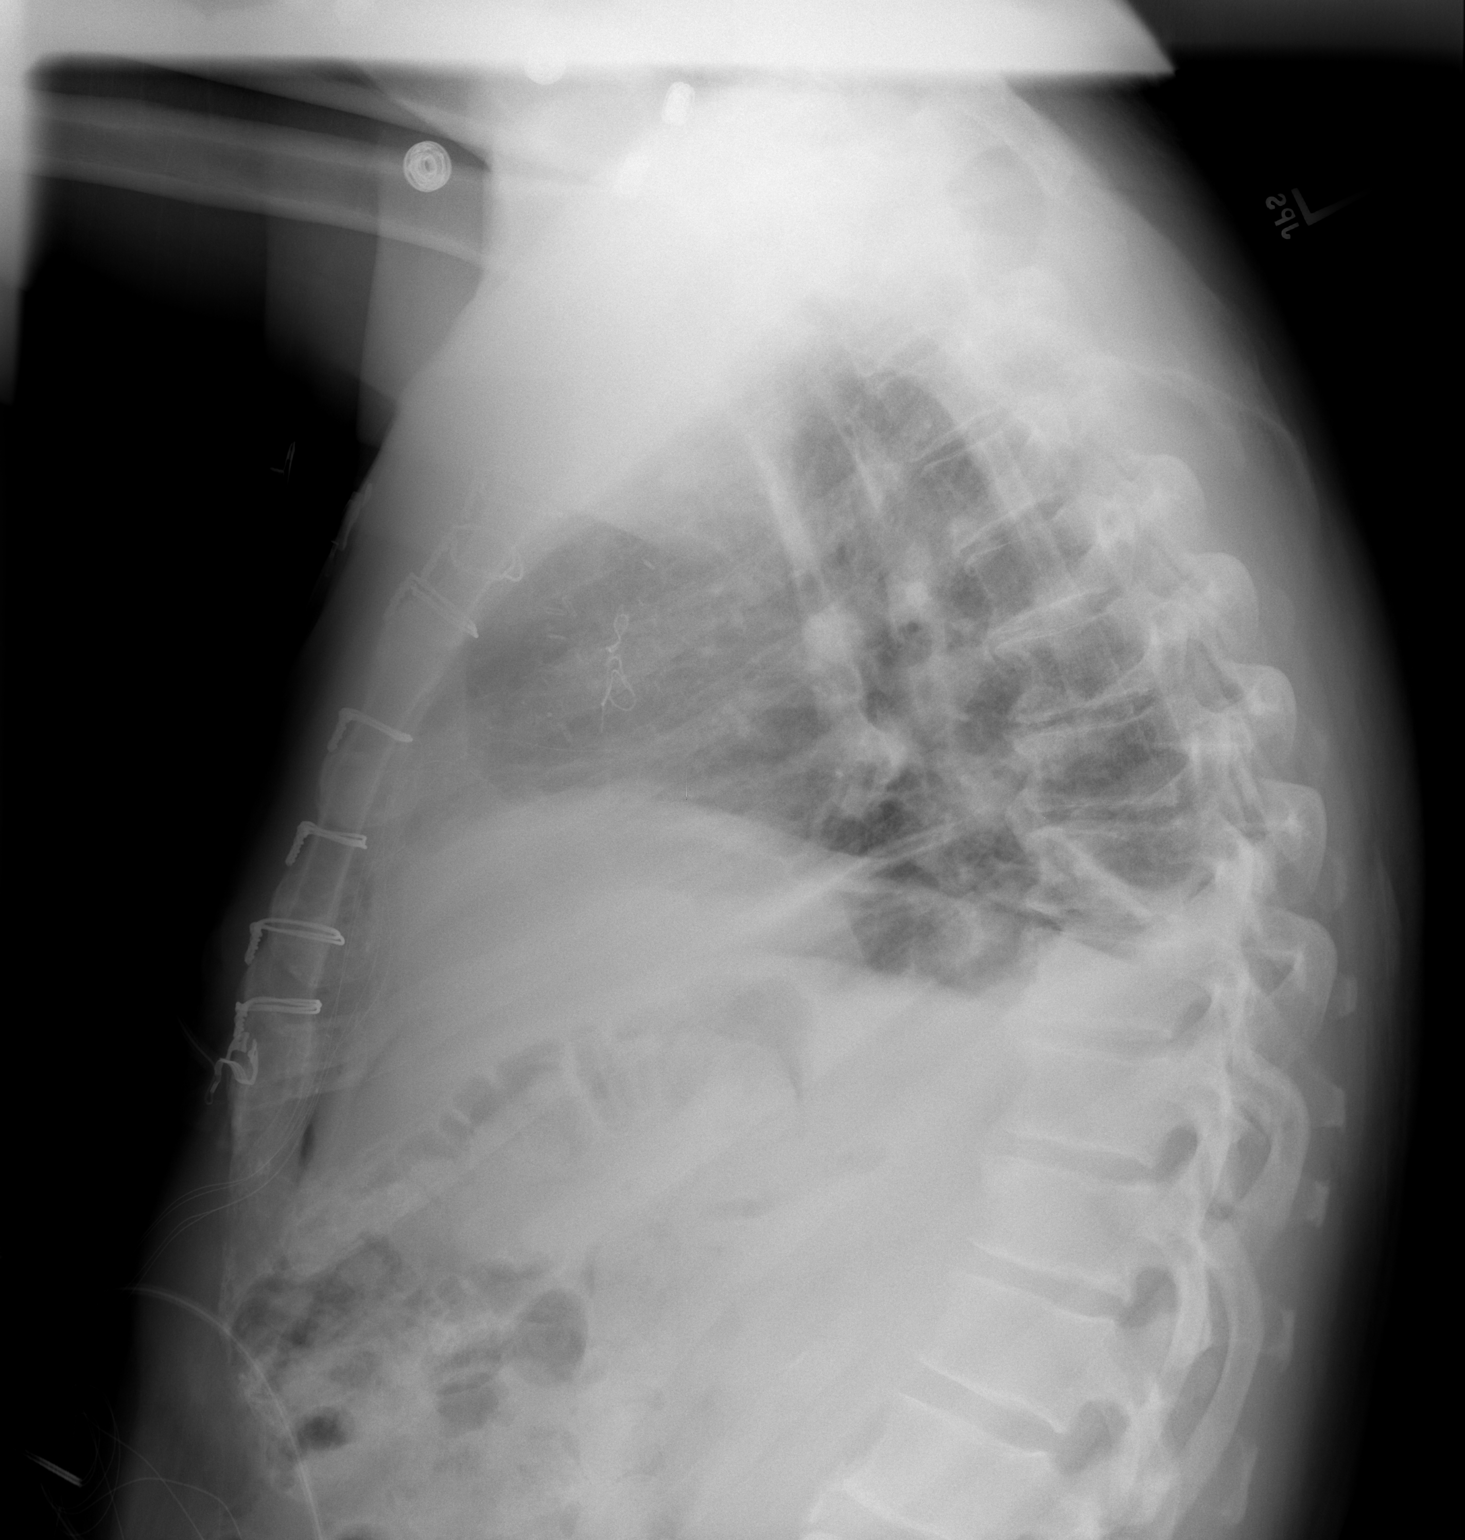

[2 of 2 positions shown; findings below may reference images not displayed]

FINDINGS: Central catheter has been removed.  Temporary pacemaker
wires remain attached to the right heart.  A small amount of air in
the anterior mediastinum adjacent to these wires is stable.  There
is no pneumothorax.

There is patchy atelectasis in the bases.  The lungs are otherwise
clear.  Heart is mildly enlarged with normal pulmonary vascularity.
The patient is status post coronary bypass grafting.
IMPRESSION: Patchy bibasilar atelectasis.  No edema or consolidation.  No
demonstrable pneumothorax.

## 2015-07-15 ENCOUNTER — Other Ambulatory Visit: Payer: Self-pay | Admitting: "Endocrinology

## 2015-08-17 LAB — HEMOGLOBIN A1C: Hemoglobin A1C: 5.9

## 2015-08-27 ENCOUNTER — Ambulatory Visit (INDEPENDENT_AMBULATORY_CARE_PROVIDER_SITE_OTHER): Payer: Managed Care, Other (non HMO) | Admitting: "Endocrinology

## 2015-08-27 ENCOUNTER — Encounter: Payer: Self-pay | Admitting: "Endocrinology

## 2015-08-27 VITALS — BP 113/70 | HR 52 | Ht 73.0 in | Wt 215.0 lb

## 2015-08-27 DIAGNOSIS — I10 Essential (primary) hypertension: Secondary | ICD-10-CM | POA: Diagnosis not present

## 2015-08-27 DIAGNOSIS — E1159 Type 2 diabetes mellitus with other circulatory complications: Secondary | ICD-10-CM | POA: Diagnosis not present

## 2015-08-27 DIAGNOSIS — E785 Hyperlipidemia, unspecified: Secondary | ICD-10-CM | POA: Diagnosis not present

## 2015-08-27 NOTE — Progress Notes (Signed)
Subjective:    Patient ID: [name] [name], male    DOB: [birth year], PCP Rory Percy, MD   Past Medical History  Diagnosis Date  . Type 2 diabetes mellitus ([place])   . Essential hypertension, benign   . OSA on CPAP   . PUD (peptic ulcer disease)     Diagnosed in the 1990s  . GERD (gastroesophageal reflux disease)   . NSTEMI (non-ST elevated myocardial infarction) [facility])     October 2014  . Gout   . Coronary atherosclerosis of native coronary artery     Multivessel status post CABG, LVEF 50-55% (LIMA-LAD, SVG-D1, SVG-D2, SVG-OM)   Past Surgical History  Procedure Laterality Date  . Nasal septum surgery  > 20 yr ago  . Coronary artery bypass graft N/A 04/25/2013    Procedure: CORONARY ARTERY BYPASS GRAFTING (CABG);  Surgeon: [name], MD;  Location: [place];  Service: Open Heart Surgery;  Laterality: N/A;  Coronary artery bypass graft times four, on pump, using left internal mammary artery and right greater saphenous vein via endovein harvest.  . Intraoperative transesophageal echocardiogram N/A 04/25/2013    Procedure: INTRAOPERATIVE TRANSESOPHAGEAL ECHOCARDIOGRAM;  Surgeon: [name], MD;  Location: [place];  Service: Open Heart Surgery;  Laterality: N/A;  . Left heart catheterization with coronary angiogram N/A 04/20/2013    Procedure: LEFT HEART CATHETERIZATION WITH CORONARY ANGIOGRAM;  Surgeon: [name], MD;  Location: [facility] CATH LAB;  Service: Cardiovascular;  Laterality: N/A;   Social History   Social History  . Marital Status: Married    Spouse Name: N/A  . Number of Children: N/A  . Years of Education: N/A   Occupational History  . Drives truck Mining engineer   Social History Main Topics  . Smoking status: Never Smoker   . Smokeless tobacco: Never Used  . Alcohol Use: No  . Drug Use: No  . Sexual Activity: Not Asked   Other Topics Concern  . None   Social History Narrative   Lives with wife, does not exercise but part of his job is  strenuous. Works in a body-shop part-time, too.   2 of his mother's sisters and 1 brother died suddenly (family was told MI but no autopsy).    Outpatient Encounter Prescriptions as of 08/27/2015  Medication Sig  . aspirin EC 325 MG EC tablet Take 1 tablet (325 mg total) by mouth daily.  . colchicine 0.6 MG tablet Take 0.6 mg by mouth as needed.  Marland Kitchen escitalopram (LEXAPRO) 10 MG tablet Take 10 mg by mouth daily.  Marland Kitchen lovastatin (MEVACOR) 20 MG tablet TAKE ONE TABLET BY MOUTH ONCE DAILY AT BEDTIME **STOP  TAKING  ATORVASTATIN**  . metFORMIN (GLUCOPHAGE) 500 MG tablet TAKE ONE TABLET BY MOUTH TWICE DAILY  . metoprolol tartrate (LOPRESSOR) 25 MG tablet Take 0.5 tablets (12.5 mg total) by mouth 2 (two) times daily.  . Multiple Vitamin (MULTIVITAMIN WITH MINERALS) TABS tablet Take 1 tablet by mouth daily.  Marland Kitchen omeprazole (PRILOSEC) 20 MG capsule Take 20 mg by mouth daily.   No facility-administered encounter medications on file as of 08/27/2015.   ALLERGIES: No Known Allergies VACCINATION STATUS: There is no immunization history for the selected administration types on file for this patient.  Diabetes He presents for his follow-up diabetic visit. He has type 2 diabetes mellitus. His disease course has been improving. There are no hypoglycemic associated symptoms. Pertinent negatives for hypoglycemia include no confusion, headaches, pallor or seizures. There are no diabetic associated  symptoms. Pertinent negatives for diabetes include no chest pain, no fatigue, no polydipsia, no polyphagia, no polyuria and no weakness. There are no hypoglycemic complications. Symptoms are improving. Diabetic complications include heart disease. Risk factors for coronary artery disease include diabetes mellitus, dyslipidemia, hypertension, male sex and sedentary lifestyle. He is compliant with treatment most of the time. His weight is stable. He is following a diabetic diet. When asked about meal planning, he reported none.  He never participates in exercise.  Hyperlipidemia This is a chronic problem. The current episode started more than 1 year ago. Pertinent negatives include no chest pain, myalgias or shortness of breath. Current antihyperlipidemic treatment includes statins. Risk factors for coronary artery disease include diabetes mellitus, dyslipidemia and hypertension.  Hypertension This is a chronic problem. Pertinent negatives include no chest pain, headaches, neck pain, palpitations or shortness of breath. Risk factors for coronary artery disease include dyslipidemia, diabetes mellitus, male gender and sedentary lifestyle. Past treatments include beta blockers.     Review of Systems  Constitutional: Negative for fever, chills, fatigue and unexpected weight change.  HENT: Negative for dental problem, mouth sores and trouble swallowing.   Eyes: Negative for visual disturbance.  Respiratory: Negative for cough, choking, chest tightness, shortness of breath and wheezing.   Cardiovascular: Negative for chest pain, palpitations and leg swelling.  Gastrointestinal: Negative for nausea, vomiting, abdominal pain, diarrhea, constipation and abdominal distention.  Endocrine: Negative for polydipsia, polyphagia and polyuria.  Genitourinary: Negative for dysuria, urgency, hematuria and flank pain.  Musculoskeletal: Negative for myalgias, back pain, gait problem and neck pain.  Skin: Negative for pallor, rash and wound.  Neurological: Negative for seizures, syncope, weakness, numbness and headaches.  Psychiatric/Behavioral: Negative.  Negative for confusion and dysphoric mood.    Objective:    BP 113/70 mmHg  Pulse 52  Ht 6\' 1"  (1.854 m)  Wt 215 lb (97.523 kg)  BMI 28.37 kg/m2  SpO2 99%  Wt Readings from Last 3 Encounters:  08/27/15 215 lb (97.523 kg)  04/12/15 212 lb 1.9 oz (96.217 kg)  09/04/14 216 lb 6.4 oz (98.158 kg)    Physical Exam  Constitutional: He is oriented to person, place, and time. He  appears well-developed and well-nourished. He is cooperative. No distress.  HENT:  Head: Normocephalic and atraumatic.  Eyes: EOM are normal.  Neck: Normal range of motion. Neck supple. No tracheal deviation present. No thyromegaly present.  Cardiovascular: Normal rate, S1 normal, S2 normal and normal heart sounds.  Exam reveals no gallop.   No murmur heard. Pulses:      Dorsalis pedis pulses are 1+ on the right side, and 1+ on the left side.       Posterior tibial pulses are 1+ on the right side, and 1+ on the left side.  Pulmonary/Chest: Breath sounds normal. No respiratory distress. He has no wheezes.  Abdominal: Soft. Bowel sounds are normal. He exhibits no distension. There is no tenderness. There is no guarding and no CVA tenderness.  Musculoskeletal: He exhibits no edema.       Right shoulder: He exhibits no swelling and no deformity.  Neurological: He is alert and oriented to person, place, and time. He has normal strength and normal reflexes. No cranial nerve deficit or sensory deficit. Gait normal.  Skin: Skin is warm and dry. No rash noted. No cyanosis. Nails show no clubbing.  Psychiatric: He has a normal mood and affect. His speech is normal and behavior is normal. Judgment and thought content normal. Cognition and  memory are normal.     CMP ( most recent) CMP     Component Value Date/Time   NA 134* 04/30/2013 0500   K 4.4 04/30/2013 0500   CL 97 04/30/2013 0500   CO2 26 04/30/2013 0500   GLUCOSE 166* 04/30/2013 0500   BUN 27* 04/30/2013 0500   CREATININE 1.34 04/30/2013 0500   CALCIUM 9.0 04/30/2013 0500   PROT 6.5 04/20/2013 0515   ALBUMIN 3.5 04/20/2013 0515   AST 18 04/20/2013 0515   ALT 24 04/20/2013 0515   ALKPHOS 79 04/20/2013 0515   BILITOT 0.9 04/20/2013 0515   GFRNONAA 56* 04/30/2013 0500   GFRAA 65* 04/30/2013 0500     Diabetic Labs (most recent): Lab Results  Component Value Date   HGBA1C 5.9 08/17/2015   HGBA1C 7.9* 04/25/2013     Lipid  Panel ( most recent) Lipid Panel     Component Value Date/Time   CHOL 176 04/20/2013 0515   TRIG 327* 04/20/2013 0515   HDL 29* 04/20/2013 0515   CHOLHDL 6.1 04/20/2013 0515   VLDL 65* 04/20/2013 0515   LDLCALC 82 04/20/2013 0515     Assessment & Plan:   1. Type 2 diabetes mellitus with other circulatory complications (HCC)    His diabetes is  complicated by coronary artery disease status post coronary artery bypass graft and patient remains at a high risk for more acute and chronic complications of diabetes which include CAD, CVA, CKD, retinopathy, and neuropathy. These are all discussed in detail with the patient.  Patient came with controlled diabetes with A1c of 5.9%.  Recent labs reviewed.   - I have re-counseled the patient on diet management  by adopting a carbohydrate restricted / protein rich  Diet.  - Suggestion is made for patient to avoid simple carbohydrates   from their diet including Cakes , Desserts, Ice Cream,  Soda (  diet and regular) , Sweet Tea , Candies,  Chips, Cookies, Artificial Sweeteners,   and "Sugar-free" Products .  This will help patient to have stable blood glucose profile and potentially avoid unintended  Weight gain.  - Patient is advised to stick to a routine mealtimes to eat 3 meals  a day and avoid unnecessary snacks ( to snack only to correct hypoglycemia).  - I have approached patient with the following individualized plan to manage diabetes and patient agrees.  -Based on his current A1c of 5.9%, he will not need insulin therapy. - I will continue metformin 500 mg by mouth daily, therapeutically suitable for patient. His recent labs showed slight elevation in his serum creatinine and serum BUN levels likely due to dehydration. I have advised patient to keep adequate hydration and will repeat his renal function before his next visit.  - Patient will be considered for incretin therapy as appropriate next visit if he loses control. - Patient  specific target  for A1c; LDL, HDL, Triglycerides, and  Waist Circumference were discussed in detail.  2) BP/HTN: Controlled. Continue current medications including beta blockers. 3) Lipids/HPL:  continue statins. 4)  Weight/Diet: exercise, and carbohydrates information provided.  5) Chronic Care/Health Maintenance:  -Patient is on  Statin medications and encouraged to continue to follow up with Ophthalmology, Podiatrist at least yearly or according to recommendations, and advised to  stay away from smoking. I have recommended yearly flu vaccine and pneumonia vaccination at least every 5 years; moderate intensity exercise for up to 150 minutes weekly; and  sleep for at least 7 hours a  day.  - 25 minutes of time was spent on the care of this patient , 50% of which was applied for counseling on diabetes complications and their preventions.  - I advised patient to maintain close follow up with Rory Percy, MD for primary care needs.  Patient is asked to bring meter and  blood glucose logs during their next visit.   Follow up plan: -Return in about 6 months (around 02/24/2016) for diabetes, high blood pressure, high cholesterol, follow up with pre-visit labs.  Glade Lloyd, MD Phone: [phone]  Fax: [phone]   08/27/2015, 10:04 AM

## 2015-10-21 ENCOUNTER — Ambulatory Visit (INDEPENDENT_AMBULATORY_CARE_PROVIDER_SITE_OTHER): Payer: Managed Care, Other (non HMO) | Admitting: Cardiology

## 2015-10-21 ENCOUNTER — Encounter: Payer: Self-pay | Admitting: Cardiology

## 2015-10-21 VITALS — BP 115/68 | HR 47 | Ht 73.0 in | Wt 218.0 lb

## 2015-10-21 DIAGNOSIS — E119 Type 2 diabetes mellitus without complications: Secondary | ICD-10-CM | POA: Diagnosis not present

## 2015-10-21 DIAGNOSIS — I10 Essential (primary) hypertension: Secondary | ICD-10-CM

## 2015-10-21 DIAGNOSIS — E782 Mixed hyperlipidemia: Secondary | ICD-10-CM

## 2015-10-21 DIAGNOSIS — I2510 Atherosclerotic heart disease of native coronary artery without angina pectoris: Secondary | ICD-10-CM

## 2015-10-21 DIAGNOSIS — R001 Bradycardia, unspecified: Secondary | ICD-10-CM

## 2015-10-21 NOTE — Progress Notes (Signed)
Cardiology Office Note  Date: 10/21/2015   ID: [name] [name], DOB 04/28/1951, MRN [id number]  PCP: Rory Percy, MD  Primary Cardiologist: [name], MD   Chief Complaint  Patient presents with  . Coronary Artery Disease    History of Present Illness: [name] [name] is a 65 y.o. male last seen in September 2016. He presents for a routine visit. Reports no angina symptoms or limiting dyspnea on exertion. I reviewed his medications which are outlined below. Cardiac regimen includes aspirin, Mevacor, and Lopressor. I reviewed his ECG today which shows sinus bradycardia with prolonged PR interval and nonspecific T-wave changes. He has a history of multivessel CAD status post CABG in 2014 as outlined below.  Does report some trouble with pain from a Baker's cyst. Still remains active, enjoys traveling on his motorcycle with his wife. He plans to go camping next week in the mountains.  Reports colonoscopy recently with Dr. Britta Mccreedy, has a family history of colon cancer.  Past Medical History  Diagnosis Date  . Type 2 diabetes mellitus ([place])   . Essential hypertension, benign   . OSA on CPAP   . PUD (peptic ulcer disease)     Diagnosed in the 1990s  . GERD (gastroesophageal reflux disease)   . NSTEMI (non-ST elevated myocardial infarction) [facility])     October 2014  . Gout   . Coronary atherosclerosis of native coronary artery     Multivessel status post CABG, LVEF 50-55% (LIMA-LAD, SVG-D1, SVG-D2, SVG-OM)    Past Surgical History  Procedure Laterality Date  . Nasal septum surgery  > 20 yr ago  . Coronary artery bypass graft N/A 04/25/2013    Procedure: CORONARY ARTERY BYPASS GRAFTING (CABG);  Surgeon: [name], MD;  Location: [place];  Service: Open Heart Surgery;  Laterality: N/A;  Coronary artery bypass graft times four, on pump, using left internal mammary artery and right greater saphenous vein via endovein harvest.  . Intraoperative transesophageal  echocardiogram N/A 04/25/2013    Procedure: INTRAOPERATIVE TRANSESOPHAGEAL ECHOCARDIOGRAM;  Surgeon: [name], MD;  Location: [place];  Service: Open Heart Surgery;  Laterality: N/A;  . Left heart catheterization with coronary angiogram N/A 04/20/2013    Procedure: LEFT HEART CATHETERIZATION WITH CORONARY ANGIOGRAM;  Surgeon: [name], MD;  [facility]: [facility] LAB;  Service: Cardiovascular;  Laterality: N/A;    Current Outpatient Prescriptions  Medication Sig Dispense Refill  . aspirin EC 325 MG EC tablet Take 1 tablet (325 mg total) by mouth daily. 30 tablet 0  . colchicine 0.6 MG tablet Take 0.6 mg by mouth as needed.    Marland Kitchen escitalopram (LEXAPRO) 10 MG tablet Take 10 mg by mouth daily.    Marland Kitchen lovastatin (MEVACOR) 20 MG tablet TAKE ONE TABLET BY MOUTH ONCE DAILY AT BEDTIME **STOP  TAKING  ATORVASTATIN** 30 tablet 6  . metFORMIN (GLUCOPHAGE) 500 MG tablet Take 500 mg by mouth daily.    . metoprolol tartrate (LOPRESSOR) 25 MG tablet Take 0.5 tablets (12.5 mg total) by mouth 2 (two) times daily. 90 tablet 6  . Multiple Vitamin (MULTIVITAMIN WITH MINERALS) TABS tablet Take 1 tablet by mouth daily.    Marland Kitchen omeprazole (PRILOSEC) 20 MG capsule Take 20 mg by mouth daily.     No current facility-administered medications for this visit.   Allergies:  Review of patient's allergies indicates no known allergies.   Social History: The patient  reports that he has never smoked. He has never used smokeless tobacco.  He reports that he does not drink alcohol or use illicit drugs.   ROS:  Please see the history of present illness. Otherwise, complete review of systems is positive for knee pain related to Baker's cyst.  All other systems are reviewed and negative.   Physical Exam: VS:  BP 115/68 mmHg  Pulse 47  Ht 6\' 1"  (1.854 m)  Wt 218 lb (98.884 kg)  BMI 28.77 kg/m2  SpO2 96%, BMI Body mass index is 28.77 kg/(m^2).  Wt Readings from Last 3 Encounters:  10/21/15 218 lb (98.884 kg)  08/27/15 215  lb (97.523 kg)  04/12/15 212 lb 1.9 oz (96.217 kg)    Patient appears comfortable at rest.  HEENT: Conjunctiva and lids normal, oropharynx clear.  Neck: Supple, no elevated JVP or carotid bruits, no thyromegaly.  Lungs: Clear to auscultation, nonlabored breathing at rest.  Thorax: Well-healed sternal incision.  Cardiac: Regular rate and rhythm, no S3 or significant systolic murmur, no pericardial rub.  Abdomen: Soft, nontender, bowel sounds present.  Extremities: No pitting edema, distal pulses 2+.   ECG: I personally reviewed the prior tracing from 09/04/2014 which showed sinus bradycardia with prolonged PR interval, lead motion artifact, nonspecific T-wave changes.  Recent Labwork:  February 2017: Hemoglobin A1c 5.19 January 2015: BUN 19, creatinine 1.3, potassium 4.3, AST 23, ALT 22  Other Studies Reviewed Today:  Echocardiogram 04/20/2013: Study Conclusions  - Left ventricle: The cavity size was normal. There was focal basal hypertrophy. Systolic function was normal. The estimated ejection fraction was in the range of 50% to 55%. Wall motion was normal; there were no regional wall motion abnormalities. - Left atrium: The atrium was mildly dilated. - Right atrium: The atrium was mildly dilated.  Assessment and Plan:  1. Symptomatically stable multivessel CAD status post CABG in 2014. I reviewed his ECG which is also stable. Continue regular activity and observation.  2. Essential hypertension, blood pressure is well controlled today.  3. Type 2 diabetes mellitus, he follows with Dr. [name]. Recent hemoglobin A1c 5.9.  4. Hyperlipidemia, continues on Mevacor. Lipid follow-up is with Dr. Nadara Mustard. He has not had any changes in medical regimen.  5. Bradycardia, on low-dose Lopressor. He is asymptomatic and we continue observation.  Current medicines were reviewed with the patient today.   Orders Placed This Encounter  Procedures  . EKG 12-Lead    Disposition:  FU with me in 6 months.   Signed, [name], MD, [facility] 10/21/2015 8:50 AM    [facility] [place], [place], [place] [address] Phone: [phone]; Fax: [phone]

## 2015-10-21 NOTE — Patient Instructions (Signed)
Your physician recommends that you continue on your current medications as directed. Please refer to the Current Medication list given to you today. Your physician recommends that you schedule a follow-up appointment in: 6 months. You will receive a reminder letter in the mail in about 4 months reminding you to call and schedule your appointment. If you don't receive this letter, please contact our office. 

## 2015-11-11 ENCOUNTER — Other Ambulatory Visit: Payer: Self-pay | Admitting: "Endocrinology

## 2015-12-15 ENCOUNTER — Other Ambulatory Visit: Payer: Self-pay | Admitting: Cardiology

## 2016-01-20 ENCOUNTER — Other Ambulatory Visit: Payer: Self-pay | Admitting: *Deleted

## 2016-01-20 ENCOUNTER — Other Ambulatory Visit: Payer: Self-pay

## 2016-01-20 MED ORDER — LOVASTATIN 20 MG PO TABS: ORAL_TABLET | ORAL | Status: DC

## 2016-01-20 MED ORDER — METOPROLOL TARTRATE 25 MG PO TABS: 12.5000 mg | ORAL_TABLET | Freq: Two times a day (BID) | ORAL | Status: DC

## 2016-01-20 MED ORDER — METFORMIN HCL 500 MG PO TABS: 500.0000 mg | ORAL_TABLET | Freq: Two times a day (BID) | ORAL | Status: DC

## 2016-02-29 ENCOUNTER — Other Ambulatory Visit: Payer: Self-pay | Admitting: "Endocrinology

## 2016-02-29 LAB — BASIC METABOLIC PANEL
BUN: 25 mg/dL (ref 7–25)
CALCIUM: 9.2 mg/dL (ref 8.6–10.3)
CO2: 26 mmol/L (ref 20–31)
Chloride: 106 mmol/L (ref 98–110)
Creat: 1.23 mg/dL (ref 0.70–1.25)
Glucose, Bld: 105 mg/dL — ABNORMAL HIGH (ref 65–99)
Potassium: 4.3 mmol/L (ref 3.5–5.3)
SODIUM: 141 mmol/L (ref 135–146)

## 2016-03-02 LAB — HEMOGLOBIN A1C
HEMOGLOBIN A1C: 6.4 % — AB (ref <5.7)
MEAN PLASMA GLUCOSE: 137 mg/dL

## 2016-03-03 ENCOUNTER — Ambulatory Visit: Payer: BC Managed Care – PPO | Attending: Specialist | Admitting: Physical Therapy

## 2016-03-03 ENCOUNTER — Encounter: Payer: Self-pay | Admitting: Physical Therapy

## 2016-03-03 DIAGNOSIS — R2689 Other abnormalities of gait and mobility: Secondary | ICD-10-CM | POA: Diagnosis present

## 2016-03-03 DIAGNOSIS — M25662 Stiffness of left knee, not elsewhere classified: Secondary | ICD-10-CM | POA: Diagnosis not present

## 2016-03-03 NOTE — Therapy (Signed)
[facility] [place], [place], [address] Phone: [phone]   Fax:  [phone]  Physical Therapy Evaluation  Patient Details  Name: [name] [name] MRN: [id number] Date of Birth: Jul 20, 1950 Referring Provider: Hart Robinsons, MD  Encounter Date: 03/03/2016      PT End of Session - 03/03/16 0820    Visit Number 1   Number of Visits 12   Date for PT Re-Evaluation 04/14/16   PT Start Time 0821   PT Stop Time 0856   PT Time Calculation (min) 35 min   Activity Tolerance Patient tolerated treatment well      Past Medical History:  Diagnosis Date  . Coronary atherosclerosis of native coronary artery    Multivessel status post CABG, LVEF 50-55% (LIMA-LAD, SVG-D1, SVG-D2, SVG-OM)  . Essential hypertension, benign   . GERD (gastroesophageal reflux disease)   . Gout   . NSTEMI (non-ST elevated myocardial infarction) [facility])    October 2014  . OSA on CPAP   . PUD (peptic ulcer disease)    Diagnosed in the 1990s  . Type 2 diabetes mellitus ([place])     Past Surgical History:  Procedure Laterality Date  . CORONARY ARTERY BYPASS GRAFT N/A 04/25/2013   Procedure: CORONARY ARTERY BYPASS GRAFTING (CABG);  Surgeon: [name], MD;  Location: [place];  Service: Open Heart Surgery;  Laterality: N/A;  Coronary artery bypass graft times four, on pump, using left internal mammary artery and right greater saphenous vein via endovein harvest.  . INTRAOPERATIVE TRANSESOPHAGEAL ECHOCARDIOGRAM N/A 04/25/2013   Procedure: INTRAOPERATIVE TRANSESOPHAGEAL ECHOCARDIOGRAM;  Surgeon: [name], MD;  Location: [place];  Service: Open Heart Surgery;  Laterality: N/A;  . LEFT HEART CATHETERIZATION WITH CORONARY ANGIOGRAM N/A 04/20/2013   Procedure: LEFT HEART CATHETERIZATION WITH CORONARY ANGIOGRAM;  Surgeon: [name], MD;  Location: [facility] CATH LAB;  Service: Cardiovascular;  Laterality: N/A;  . NASAL SEPTUM SURGERY  > 20 yr ago    There  were no vitals filed for this visit.       Subjective Assessment - 03/03/16 [id number]    Subjective Patient had a left knee scope on  02/25/16 for pain. He is wearing a soft brace to support knee and sees MD 03/04/16.   Pertinent History DM, Heart Bypass 2014.   Patient Stated Goals Be able to move bettter. He has been limited the past few months.   Currently in Pain? No/denies            [facility] PT Assessment - 03/03/16 0001      Assessment   Medical Diagnosis L knee scope/debridement   Referring Provider Hart Robinsons, MD   Onset Date/Surgical Date 02/25/16   Next MD Visit 03/04/16   Prior Therapy none     Precautions   Precautions None   Precaution Comments wearing soft brace     Restrictions   Weight Bearing Restrictions No     Balance Screen   Has the patient fallen in the past 6 months No   Has the patient had a decrease in activity level because of a fear of falling?  No   Is the patient reluctant to leave their home because of a fear of falling?  No     Home Environment   Living Environment Private residence   Type of Home House   Additional Comments two story home     Prior Function   Level of Independence Independent   Vocation Part time employment  Vocation Requirements works in [employer] / Strength   AROM / PROM / Strength AROM;PROM;Strength     AROM   AROM Assessment Site Knee   Right/Left Knee Left   Left Knee Extension -5   Left Knee Flexion 110     PROM   PROM Assessment Site Knee   Right/Left Knee Left   Left Knee Extension -3   Left Knee Flexion 115     Strength   Overall Strength Comments grossly 5/5     Flexibility   Soft Tissue Assessment /Muscle Length --  tight B HS and L hip rotators     Palpation   Patella mobility decreased left patella mobility med/lat and inf/sup   Palpation comment unremarkable     Ambulation/Gait   Gait Comments decreased stance time on LLE     Balance   Balance Assessed Yes     High Level  Balance   High Level Balance Comments SLS limited to 2-3 seconds LLE and 4 seconds RLE                   [facility] Adult PT Treatment/Exercise - 03/03/16 0001      Exercises   Exercises Knee/Hip     Knee/Hip Exercises: Aerobic   Nustep L2 x 5 min                PT Education - 03/03/16 0853    Education provided Yes   Education Details HEP   Person(s) Educated Patient   Methods Explanation;Demonstration;Handout   Comprehension Verbalized understanding;Returned demonstration             PT Long Term Goals - 03/03/16 1445      PT LONG TERM GOAL #1   Title I with HEP   Time 4   Period Weeks   Status New     PT LONG TERM GOAL #2   Title Patient able to ambulate with a normal gait pattern.   Time 4   Period Weeks   Status New     PT LONG TERM GOAL #3   Title Patient able to climb stairs with a reciprocal gait pattern without deviation.   Time 4   Period Weeks   Status New     PT LONG TERM GOAL #4   Title Demo improved L knee ROM 0-120 degrees to improve function.   Time 4   Period Weeks   Status New               Plan - 03/03/16 [id number]    Clinical Impression Statement Patient presents s/p L knee scope x 1 week. He ambulates with antalgic gait pattern with decreased stance time on LLE and decreased knee flexion during swing phase. He uses a step to gait pattern on stairs and has limited SLS ability. Patient also has decreased ROM and tightness in BLE. He reports minimal pain at this time.   Rehab Potential Excellent   PT Frequency 2x / week   PT Duration 4 weeks   PT Treatment/Interventions ADLs/Self Care Home Management;Electrical Stimulation;Gait training;Stair training;Therapeutic exercise;Balance training;Neuromuscular re-education;Patient/family education;Passive range of motion;Manual techniques   PT Next Visit Plan Patellar mobs, ROM, gait, stair and balance training. Modalities for pain prn.   PT Home Exercise Plan HS stretch    Consulted and Agree with Plan of Care Patient      Patient will benefit from skilled therapeutic intervention in order to improve the following deficits and  impairments:  Abnormal gait, Pain, Decreased balance, Impaired flexibility  Visit Diagnosis: Stiffness of left knee, not elsewhere classified - Plan: PT plan of care cert/re-cert  Other abnormalities of gait and mobility - Plan: PT plan of care cert/re-cert     Problem List Patient Active Problem List   Diagnosis Date Noted  . HLD (hyperlipidemia) 05/24/2013  . Coronary atherosclerosis of native coronary artery 05/24/2013  . S/P CABG x 4 04/28/2013  . Gout   . Type 2 diabetes mellitus with other circulatory complications ([place])   . Essential hypertension, benign   . OSA on CPAP   . GERD (gastroesophageal reflux disease)     Madelyn Flavors PT 03/03/2016, 2:54 PM  [facility] [address] [place], [place], [address] Phone: [phone]   Fax:  [phone]  Name: [name] [name] MRN: [id number] Date of Birth: September 18, 1950

## 2016-03-03 NOTE — Patient Instructions (Signed)
Stretching: Hamstring (Sitting)   With right leg straight, tuck other foot near groin. Reach down until stretch is felt in back of thigh. Keep back straight. Hold _30-60___ seconds. Repeat __3-5__ times per set. Do ____ sets per session. Do _2-3___ sessions per day.  http://orth.exer.us/661   Copyright  VHI. All rights reserved.   Also weight shift on to left side.  Madelyn Flavors, PT 03/03/16 8:52 AM [facility] [address] [place], [place], [address] Phone: [phone]   Fax:  [phone]

## 2016-03-05 ENCOUNTER — Ambulatory Visit: Payer: BC Managed Care – PPO | Admitting: Physical Therapy

## 2016-03-05 ENCOUNTER — Encounter: Payer: Self-pay | Admitting: Physical Therapy

## 2016-03-05 DIAGNOSIS — M25662 Stiffness of left knee, not elsewhere classified: Secondary | ICD-10-CM | POA: Diagnosis not present

## 2016-03-05 DIAGNOSIS — R2689 Other abnormalities of gait and mobility: Secondary | ICD-10-CM

## 2016-03-05 NOTE — Therapy (Signed)
[facility] [place], [place], [address] Phone: [phone]   Fax:  [phone]  Physical Therapy Treatment  Patient Details  Name: [name] [name] MRN: [id number] Date of Birth: [birth year] Referring Provider: Hart Robinsons, MD  Encounter Date: 03/05/2016      PT End of Session - 03/05/16 0808    Visit Number 2   Number of Visits 12   Date for PT Re-Evaluation 04/14/16   PT Start Time 0733   PT Stop Time 0813   PT Time Calculation (min) 40 min   Activity Tolerance Patient tolerated treatment well   Behavior During Therapy [facility] for tasks assessed/performed      Past Medical History:  Diagnosis Date  . Coronary atherosclerosis of native coronary artery    Multivessel status post CABG, LVEF 50-55% (LIMA-LAD, SVG-D1, SVG-D2, SVG-OM)  . Essential hypertension, benign   . GERD (gastroesophageal reflux disease)   . Gout   . NSTEMI (non-ST elevated myocardial infarction) [facility])    October 2014  . OSA on CPAP   . PUD (peptic ulcer disease)    Diagnosed in the 1990s  . Type 2 diabetes mellitus ([place])     Past Surgical History:  Procedure Laterality Date  . CORONARY ARTERY BYPASS GRAFT N/A 04/25/2013   Procedure: CORONARY ARTERY BYPASS GRAFTING (CABG);  Surgeon: [name], MD;  Location: [place];  Service: Open Heart Surgery;  Laterality: N/A;  Coronary artery bypass graft times four, on pump, using left internal mammary artery and right greater saphenous vein via endovein harvest.  . INTRAOPERATIVE TRANSESOPHAGEAL ECHOCARDIOGRAM N/A 04/25/2013   Procedure: INTRAOPERATIVE TRANSESOPHAGEAL ECHOCARDIOGRAM;  Surgeon: [name], MD;  Location: [place];  Service: Open Heart Surgery;  Laterality: N/A;  . LEFT HEART CATHETERIZATION WITH CORONARY ANGIOGRAM N/A 04/20/2013   Procedure: LEFT HEART CATHETERIZATION WITH CORONARY ANGIOGRAM;  Surgeon: [name], MD;  Location: [facility] CATH LAB;  Service: Cardiovascular;  Laterality:  N/A;  . NASAL SEPTUM SURGERY  > 20 yr ago    There were no vitals filed for this visit.      Subjective Assessment - 03/05/16 0735    Subjective Patient arrived with no brace today and no pain reported   Pertinent History DM, Heart Bypass 2014.   Patient Stated Goals Be able to move bettter. He has been limited the past few months.   Currently in Pain? No/denies                         [facility] Adult PT Treatment/Exercise - 03/05/16 0001      Knee/Hip Exercises: Stretches   Active Hamstring Stretch Left;3 reps;30 seconds  using seps   Knee: Self-Stretch to increase Flexion Left;3 reps;30 seconds     Knee/Hip Exercises: Aerobic   Nustep 26min L3 for endurance and ROM     Knee/Hip Exercises: Standing   Lateral Step Up Left;3 sets;10 reps;Step Height: 6"   Forward Step Up Left;3 sets;10 reps;Step Height: 6"   Step Down Left;2 sets;10 reps;Step Height: 6"   Rocker Board 2 minutes     Knee/Hip Exercises: Supine   Straight Leg Raise with External Rotation Strengthening;Left;3 sets;10 reps     Knee/Hip Exercises: Sidelying   Hip ABduction Strengthening;Left;2 sets;10 reps                PT Education - 03/05/16 0805    Education Details HEP   Person(s) Educated Patient   Methods Explanation;Demonstration;Handout  Comprehension Verbalized understanding;Returned demonstration             PT Long Term Goals - 03/05/16 0800      PT LONG TERM GOAL #1   Title I with HEP   Time 4   Period Weeks   Status On-going     PT LONG TERM GOAL #2   Title Patient able to ambulate with a normal gait pattern.   Time 4   Period Weeks   Status On-going     PT LONG TERM GOAL #3   Title Patient able to climb stairs with a reciprocal gait pattern without deviation.   Time 4   Period Weeks   Status On-going     PT LONG TERM GOAL #4   Title Demo improved L knee ROM 0-120 degrees to improve function.   Time 4   Period Weeks   Status On-going                Plan - 03/05/16 0810    Clinical Impression Statement Patient is progressing with all activities. Patient tolerated treatment well today and had no pain pre or post treatment. Patient has been at work and has been able to complete all work activity without difficulty from knee. Patient was given inital HEP today. Patient ambulates with stiff knee today. Educated patient on focused bening and straightening knee.   Rehab Potential Excellent   PT Frequency 2x / week   PT Duration 4 weeks   PT Treatment/Interventions ADLs/Self Care Home Management;Electrical Stimulation;Gait training;Stair training;Therapeutic exercise;Balance training;Neuromuscular re-education;Patient/family education;Passive range of motion;Manual techniques   PT Next Visit Plan cont with POC for Patellar mobs, ROM, gait, stair and balance training. Modalities for pain prn.   Consulted and Agree with Plan of Care Patient      Patient will benefit from skilled therapeutic intervention in order to improve the following deficits and impairments:  Abnormal gait, Pain, Decreased balance, Impaired flexibility  Visit Diagnosis: Stiffness of left knee, not elsewhere classified  Other abnormalities of gait and mobility     Problem List Patient Active Problem List   Diagnosis Date Noted  . HLD (hyperlipidemia) 05/24/2013  . Coronary atherosclerosis of native coronary artery 05/24/2013  . S/[name] CABG x 4 04/28/2013  . Gout   . Type 2 diabetes mellitus with other circulatory complications ([place])   . Essential hypertension, benign   . OSA on CPAP   . GERD (gastroesophageal reflux disease)     [name] [name], PTA 03/05/2016, 8:17 AM  [facility] [place], [place], [address] Phone: [phone]   Fax:  [phone]  Name: DAMIANO RINEER MRN: [id number] Date of Birth: 1951-04-29

## 2016-03-05 NOTE — Patient Instructions (Signed)
   Strengthening: Hip Abduction (Side-Lying)  Strengthening: Straight Leg Raise (Phase 1)  Repeat _10___ times per set. Do __2__ sets per session. Do __2__ sessions per day.      Straight Leg Raise   Tighten stomach and slowly raise locked right leg __4__ inches from floor. Repeat __10-30__ times per set. Do __2__ sets per session. Do __2__ sessions per day.  Knee Extension Mobilization: Towel Prop   With rolled towel under right ankle, place _1-5___ pound weight across knee. Hold __5+__ minutes. Repeat __2-3__ times per set. Do __2__ sets per session. Do __2-4__ sessions per day.  Knee ext stretch    Stretching: Hamstring (Standing)    Place right foot on small stool. Slowly lean forward, keeping back straight, until stretch is felt in back of thigh. Hold __30__ seconds. Repeat __3__ times per set. Do __1-2__ sets per session. Do _2-4___ sessions per day.      Knee Flexion Stretch on Step  Place foot on step and lean forward until you feel a good stretch in front of knee.   hold 30 sec x 5-10 perform 2-4 x daily

## 2016-03-09 ENCOUNTER — Ambulatory Visit: Payer: BC Managed Care – PPO | Admitting: Physical Therapy

## 2016-03-09 ENCOUNTER — Encounter: Payer: Self-pay | Admitting: Physical Therapy

## 2016-03-09 DIAGNOSIS — R2689 Other abnormalities of gait and mobility: Secondary | ICD-10-CM

## 2016-03-09 DIAGNOSIS — M25662 Stiffness of left knee, not elsewhere classified: Secondary | ICD-10-CM

## 2016-03-09 NOTE — Therapy (Signed)
[facility] [place], [place], [address] Phone: [phone]   Fax:  [phone]  Physical Therapy Treatment  Patient Details  Name: [name] [name] MRN: [id number] Date of Birth: July 23, 1950 Referring Provider: Hart Robinsons, MD  Encounter Date: 03/09/2016      PT End of Session - 03/09/16 0734    Visit Number 3   Number of Visits 12   Date for PT Re-Evaluation 04/14/16   PT Start Time 0733   PT Stop Time 0813   PT Time Calculation (min) 40 min   Activity Tolerance Patient tolerated treatment well   Behavior During Therapy [facility] for tasks assessed/performed      Past Medical History:  Diagnosis Date  . Coronary atherosclerosis of native coronary artery    Multivessel status post CABG, LVEF 50-55% (LIMA-LAD, SVG-D1, SVG-D2, SVG-OM)  . Essential hypertension, benign   . GERD (gastroesophageal reflux disease)   . Gout   . NSTEMI (non-ST elevated myocardial infarction) [facility])    October 2014  . OSA on CPAP   . PUD (peptic ulcer disease)    Diagnosed in the 1990s  . Type 2 diabetes mellitus ([place])     Past Surgical History:  Procedure Laterality Date  . CORONARY ARTERY BYPASS GRAFT N/A 04/25/2013   Procedure: CORONARY ARTERY BYPASS GRAFTING (CABG);  Surgeon: [name], MD;  Location: [place];  Service: Open Heart Surgery;  Laterality: N/A;  Coronary artery bypass graft times four, on pump, using left internal mammary artery and right greater saphenous vein via endovein harvest.  . INTRAOPERATIVE TRANSESOPHAGEAL ECHOCARDIOGRAM N/A 04/25/2013   Procedure: INTRAOPERATIVE TRANSESOPHAGEAL ECHOCARDIOGRAM;  Surgeon: [name], MD;  Location: [place];  Service: Open Heart Surgery;  Laterality: N/A;  . LEFT HEART CATHETERIZATION WITH CORONARY ANGIOGRAM N/A 04/20/2013   Procedure: LEFT HEART CATHETERIZATION WITH CORONARY ANGIOGRAM;  Surgeon: [name], MD;  Location: [facility] CATH LAB;  Service: Cardiovascular;  Laterality:  N/A;  . NASAL SEPTUM SURGERY  > 20 yr ago    There were no vitals filed for this visit.      Subjective Assessment - 03/09/16 0733    Subjective Patient denied any pain upon arrival. Reports that MD said that MD pleased with his progress and said in a few more weeks he will be back on his motorcycle. Arrived to treatment with no soft brace donned to knee.   Pertinent History DM, Heart Bypass 2014.   Patient Stated Goals Be able to move bettter. He has been limited the past few months.   Currently in Pain? No/denies            [facility] PT Assessment - 03/09/16 0001      Assessment   Medical Diagnosis L knee scope/debridement   Onset Date/Surgical Date 02/25/16   Prior Therapy none                     [facility] Adult PT Treatment/Exercise - 03/09/16 0001      Knee/Hip Exercises: Stretches   Active Hamstring Stretch Left;3 reps;30 seconds     Knee/Hip Exercises: Aerobic   Nustep L3 x10 min, seat 14  LE only     Knee/Hip Exercises: Standing   Forward Lunges Left;2 sets;10 reps   Terminal Knee Extension Limitations LLE red theraband 2x10 reps 3 sec hold   Lateral Step Up Left;3 sets;10 reps;Hand Hold: 2;Step Height: 6"   Forward Step Up Left;3 sets;10 reps;Hand Hold: 2;Step Height: 6"  Step Down Left;2 sets;10 reps;Hand Hold: 2;Step Height: 4"   Rocker Board 3 minutes     Knee/Hip Exercises: Seated   Long Arc Quad Strengthening;Left;2 sets;10 reps;Weights   Long Arc Quad Weight 4 lbs.   Long Arc Quad Limitations 3 sec hold at end range     Knee/Hip Exercises: Supine   Heel Slides AROM;Left;2 sets;10 reps   Bridges Limitations 2x10 reps   Straight Leg Raises Strengthening;Left;3 sets;10 reps   Straight Leg Raise with External Rotation Strengthening;Left;3 sets;10 reps     Knee/Hip Exercises: Sidelying   Hip ABduction Strengthening;Left;3 sets;10 reps   Hip ADduction Strengthening;Left;3 sets;10 reps   Clams LLE red theraband 3x10 reps     Manual Therapy    Manual Therapy Soft tissue mobilization   Soft tissue mobilization L patellar mobilizations in sup/inf, lat/med directions to improve patellar mobility in supine                     PT Long Term Goals - 03/05/16 0800      PT LONG TERM GOAL #1   Title I with HEP   Time 4   Period Weeks   Status On-going     PT LONG TERM GOAL #2   Title Patient able to ambulate with a normal gait pattern.   Time 4   Period Weeks   Status On-going     PT LONG TERM GOAL #3   Title Patient able to climb stairs with a reciprocal gait pattern without deviation.   Time 4   Period Weeks   Status On-going     PT LONG TERM GOAL #4   Title Demo improved L knee ROM 0-120 degrees to improve function.   Time 4   Period Weeks   Status On-going               Plan - 03/09/16 0813    Clinical Impression Statement Patient continues to progress with L knee strengthening exercises with only soreness reported during today's exercises. Patient required minimal to moderate multimodal cueing for proper exercise technique and parameters. Patient demonstrated good L VMO and quad contraction with exercises. Patient ambulated into clinic without soft brace and with decreased L knee stiffness. Fairly good L patella mobility noted with patellar mobilizations with slight limitation into superior direction. Patient continues to present with tightness of L HS at this time.    Rehab Potential Excellent   PT Frequency 2x / week   PT Duration 4 weeks   PT Treatment/Interventions ADLs/Self Care Home Management;Electrical Stimulation;Gait training;Stair training;Therapeutic exercise;Balance training;Neuromuscular re-education;Patient/family education;Passive range of motion;Manual techniques   PT Next Visit Plan cont with POC for Patellar mobs, ROM, gait, stair and balance training. Modalities for pain prn.   PT Home Exercise Plan HS stretch   Consulted and Agree with Plan of Care Patient      Patient will  benefit from skilled therapeutic intervention in order to improve the following deficits and impairments:  Abnormal gait, Pain, Decreased balance, Impaired flexibility  Visit Diagnosis: Stiffness of left knee, not elsewhere classified  Other abnormalities of gait and mobility     Problem List Patient Active Problem List   Diagnosis Date Noted  . HLD (hyperlipidemia) 05/24/2013  . Coronary atherosclerosis of native coronary artery 05/24/2013  . S/P CABG x 4 04/28/2013  . Gout   . Type 2 diabetes mellitus with other circulatory complications ([place])   . Essential hypertension, benign   . OSA on CPAP   .  GERD (gastroesophageal reflux disease)     Wynelle Fanny, PTA 03/09/2016, 8:50 AM  [facility] [address] [place], [place], [address] Phone: [phone]   Fax:  [phone]  Name: [name] [name] MRN: [id number] Date of Birth: 1950-09-01

## 2016-03-10 ENCOUNTER — Ambulatory Visit (INDEPENDENT_AMBULATORY_CARE_PROVIDER_SITE_OTHER): Payer: BC Managed Care – PPO | Admitting: "Endocrinology

## 2016-03-10 ENCOUNTER — Encounter: Payer: Self-pay | Admitting: "Endocrinology

## 2016-03-10 VITALS — BP 136/75 | HR 56 | Ht 73.0 in | Wt 221.0 lb

## 2016-03-10 DIAGNOSIS — E1159 Type 2 diabetes mellitus with other circulatory complications: Secondary | ICD-10-CM | POA: Diagnosis not present

## 2016-03-10 DIAGNOSIS — I10 Essential (primary) hypertension: Secondary | ICD-10-CM | POA: Diagnosis not present

## 2016-03-10 DIAGNOSIS — E785 Hyperlipidemia, unspecified: Secondary | ICD-10-CM | POA: Diagnosis not present

## 2016-03-10 NOTE — Progress Notes (Signed)
Subjective:    Patient ID: [name] [name], male    DOB: 1951-03-16, PCP Rory Percy, MD   Past Medical History:  Diagnosis Date  . Coronary atherosclerosis of native coronary artery    Multivessel status post CABG, LVEF 50-55% (LIMA-LAD, SVG-D1, SVG-D2, SVG-OM)  . Essential hypertension, benign   . GERD (gastroesophageal reflux disease)   . Gout   . NSTEMI (non-ST elevated myocardial infarction) [facility])    October 2014  . OSA on CPAP   . PUD (peptic ulcer disease)    Diagnosed in the 1990s  . Type 2 diabetes mellitus ([place])    Past Surgical History:  Procedure Laterality Date  . CORONARY ARTERY BYPASS GRAFT N/A 04/25/2013   Procedure: CORONARY ARTERY BYPASS GRAFTING (CABG);  Surgeon: [name], MD;  Location: [place];  Service: Open Heart Surgery;  Laterality: N/A;  Coronary artery bypass graft times four, on pump, using left internal mammary artery and right greater saphenous vein via endovein harvest.  . INTRAOPERATIVE TRANSESOPHAGEAL ECHOCARDIOGRAM N/A 04/25/2013   Procedure: INTRAOPERATIVE TRANSESOPHAGEAL ECHOCARDIOGRAM;  Surgeon: [name], MD;  Location: [place];  Service: Open Heart Surgery;  Laterality: N/A;  . LEFT HEART CATHETERIZATION WITH CORONARY ANGIOGRAM N/A 04/20/2013   Procedure: LEFT HEART CATHETERIZATION WITH CORONARY ANGIOGRAM;  Surgeon: [name], MD;  Location: [facility] CATH LAB;  Service: Cardiovascular;  Laterality: N/A;  . NASAL SEPTUM SURGERY  > 20 yr ago   Social History   Social History  . Marital status: Married    Spouse name: N/A  . Number of children: N/A  . Years of education: N/A   Occupational History  . Drives truck Mining engineer   Social History Main Topics  . Smoking status: Never Smoker  . Smokeless tobacco: Never Used  . Alcohol use No  . Drug use: No  . Sexual activity: Not Asked   Other Topics Concern  . None   Social History Narrative   Lives with wife, does not exercise but part of his job is strenuous.  Works in a body-shop part-time, too.   2 of his mother's sisters and 1 brother died suddenly (family was told MI but no autopsy).    Outpatient Encounter Prescriptions as of 03/10/2016  Medication Sig  . aspirin EC 325 MG EC tablet Take 1 tablet (325 mg total) by mouth daily.  . colchicine 0.6 MG tablet Take 0.6 mg by mouth as needed.  Marland Kitchen escitalopram (LEXAPRO) 10 MG tablet Take 10 mg by mouth daily.  Marland Kitchen lovastatin (MEVACOR) 20 MG tablet TAKE ONE TABLET BY MOUTH AT BEDTIME, STOP TAKING ATORVASTATIN  . metFORMIN (GLUCOPHAGE) 500 MG tablet Take 1 tablet (500 mg total) by mouth 2 (two) times daily.  . metoprolol tartrate (LOPRESSOR) 25 MG tablet Take 0.5 tablets (12.5 mg total) by mouth 2 (two) times daily.  . Multiple Vitamin (MULTIVITAMIN WITH MINERALS) TABS tablet Take 1 tablet by mouth daily.  Marland Kitchen omeprazole (PRILOSEC) 20 MG capsule Take 20 mg by mouth daily.   No facility-administered encounter medications on file as of 03/10/2016.    ALLERGIES: No Known Allergies VACCINATION STATUS: There is no immunization history for the selected administration types on file for this patient.  Diabetes  He presents for his follow-up diabetic visit. He has type 2 diabetes mellitus. His disease course has been stable. There are no hypoglycemic associated symptoms. Pertinent negatives for hypoglycemia include no confusion, headaches, pallor or seizures. There are no diabetic associated symptoms. Pertinent negatives for  diabetes include no chest pain, no fatigue, no polydipsia, no polyphagia, no polyuria and no weakness. There are no hypoglycemic complications. Symptoms are stable. Diabetic complications include heart disease. Risk factors for coronary artery disease include diabetes mellitus, dyslipidemia, hypertension, male sex and sedentary lifestyle. He is compliant with treatment most of the time. His weight is increasing steadily. He is following a diabetic diet. When asked about meal planning, he reported  none. He never participates in exercise.  Hyperlipidemia  This is a chronic problem. The current episode started more than 1 year ago. Pertinent negatives include no chest pain, myalgias or shortness of breath. Current antihyperlipidemic treatment includes statins. Risk factors for coronary artery disease include diabetes mellitus, dyslipidemia and hypertension.  Hypertension  This is a chronic problem. Pertinent negatives include no chest pain, headaches, neck pain, palpitations or shortness of breath. Risk factors for coronary artery disease include dyslipidemia, diabetes mellitus, male gender and sedentary lifestyle. Past treatments include beta blockers.     Review of Systems  Constitutional: Negative for chills, fatigue, fever and unexpected weight change.  HENT: Negative for dental problem, mouth sores and trouble swallowing.   Eyes: Negative for visual disturbance.  Respiratory: Negative for cough, choking, chest tightness, shortness of breath and wheezing.   Cardiovascular: Negative for chest pain, palpitations and leg swelling.  Gastrointestinal: Negative for abdominal distention, abdominal pain, constipation, diarrhea, nausea and vomiting.  Endocrine: Negative for polydipsia, polyphagia and polyuria.  Genitourinary: Negative for dysuria, flank pain, hematuria and urgency.  Musculoskeletal: Negative for back pain, gait problem, myalgias and neck pain.  Skin: Negative for pallor, rash and wound.  Neurological: Negative for seizures, syncope, weakness, numbness and headaches.  Psychiatric/Behavioral: Negative.  Negative for confusion and dysphoric mood.    Objective:    BP 136/75   Pulse (!) 56   Ht 6\' 1"  (1.854 m)   Wt 221 lb (100.2 kg)   BMI 29.16 kg/m   Wt Readings from Last 3 Encounters:  03/10/16 221 lb (100.2 kg)  10/21/15 218 lb (98.9 kg)  08/27/15 215 lb (97.5 kg)    Physical Exam  Constitutional: He is oriented to person, place, and time. He appears well-developed  and well-nourished. He is cooperative. No distress.  HENT:  Head: Normocephalic and atraumatic.  Eyes: EOM are normal.  Neck: Normal range of motion. Neck supple. No tracheal deviation present. No thyromegaly present.  Cardiovascular: Normal rate, S1 normal, S2 normal and normal heart sounds.  Exam reveals no gallop.   No murmur heard. Pulses:      Dorsalis pedis pulses are 1+ on the right side, and 1+ on the left side.       Posterior tibial pulses are 1+ on the right side, and 1+ on the left side.  Pulmonary/Chest: Breath sounds normal. No respiratory distress. He has no wheezes.  Abdominal: Soft. Bowel sounds are normal. He exhibits no distension. There is no tenderness. There is no guarding and no CVA tenderness.  Musculoskeletal: He exhibits no edema.       Right shoulder: He exhibits no swelling and no deformity.  Neurological: He is alert and oriented to person, place, and time. He has normal strength and normal reflexes. No cranial nerve deficit or sensory deficit. Gait normal.  Skin: Skin is warm and dry. No rash noted. No cyanosis. Nails show no clubbing.  Psychiatric: He has a normal mood and affect. His speech is normal and behavior is normal. Judgment and thought content normal. Cognition and memory are normal.  CMP ( most recent) CMP     Component Value Date/Time   NA 141 02/29/2016 1044   K 4.3 02/29/2016 1044   CL 106 02/29/2016 1044   CO2 26 02/29/2016 1044   GLUCOSE 105 (H) 02/29/2016 1044   BUN 25 02/29/2016 1044   CREATININE 1.23 02/29/2016 1044   CALCIUM 9.2 02/29/2016 1044   PROT 6.5 04/20/2013 0515   ALBUMIN 3.5 04/20/2013 0515   AST 18 04/20/2013 0515   ALT 24 04/20/2013 0515   ALKPHOS 79 04/20/2013 0515   BILITOT 0.9 04/20/2013 0515   GFRNONAA 56 (L) 04/30/2013 0500   GFRAA 65 (L) 04/30/2013 0500     Diabetic Labs (most recent): Lab Results  Component Value Date   HGBA1C 6.4 (H) 02/29/2016   HGBA1C 5.9 08/17/2015   HGBA1C 7.9 (H) 04/25/2013      Lipid Panel ( most recent) Lipid Panel     Component Value Date/Time   CHOL 176 04/20/2013 0515   TRIG 327 (H) 04/20/2013 0515   HDL 29 (L) 04/20/2013 0515   CHOLHDL 6.1 04/20/2013 0515   VLDL 65 (H) 04/20/2013 0515   LDLCALC 82 04/20/2013 0515     Assessment & Plan:   1. Type 2 diabetes mellitus with other circulatory complications (HCC)    His diabetes is  complicated by coronary artery disease status post coronary artery bypass graft and patient remains at a high risk for more acute and chronic complications of diabetes which include CAD, CVA, CKD, retinopathy, and neuropathy. These are all discussed in detail with the patient.  Patient came with controlled diabetes with A1c of 6.4% increasing from  5.9%.  Recent labs reviewed.   - I have re-counseled the patient on diet management  by adopting a carbohydrate restricted / protein rich  Diet.  - Suggestion is made for patient to avoid simple carbohydrates   from their diet including Cakes , Desserts, Ice Cream,  Soda (  diet and regular) , Sweet Tea , Candies,  Chips, Cookies, Artificial Sweeteners,   and "Sugar-free" Products .  This will help patient to have stable blood glucose profile and potentially avoid unintended  Weight gain.  - Patient is advised to stick to a routine mealtimes to eat 3 meals  a day and avoid unnecessary snacks ( to snack only to correct hypoglycemia).  - I have approached patient with the following individualized plan to manage diabetes and patient agrees.  - I will continue metformin 500 mg by mouth daily, therapeutically suitable for patient. His serum creatinine and renal function is back to normal. I have advised patient to keep adequate hydration and will repeat his renal function before his next visit.  - Patient will be considered for incretin therapy as appropriate next visit if he loses control. - Patient specific target  for A1c; LDL, HDL, Triglycerides, and  Waist Circumference were  discussed in detail.  2) BP/HTN: Controlled. Continue current medications including beta blockers. 3) Lipids/HPL:  continue statins. 4)  Weight/Diet: exercise, and carbohydrates information provided.  5) Chronic Care/Health Maintenance:  -Patient is on  Statin medications and encouraged to continue to follow up with Ophthalmology, Podiatrist at least yearly or according to recommendations, and advised to  stay away from smoking. I have recommended yearly flu vaccine and pneumonia vaccination at least every 5 years; moderate intensity exercise for up to 150 minutes weekly; and  sleep for at least 7 hours a day.  - 25 minutes of time was spent on the  care of this patient , 50% of which was applied for counseling on diabetes complications and their preventions.  - I advised patient to maintain close follow up with Rory Percy, MD for primary care needs.  Patient is asked to bring meter and  blood glucose logs during their next visit.   Follow up plan: -Return in about 4 months (around 07/10/2016) for follow up with pre-visit labs.  Glade Lloyd, MD Phone: [phone]  Fax: [phone]   03/10/2016, 8:36 AM

## 2016-03-13 ENCOUNTER — Ambulatory Visit: Payer: BC Managed Care – PPO | Attending: Specialist | Admitting: Physical Therapy

## 2016-03-13 ENCOUNTER — Encounter: Payer: Self-pay | Admitting: Physical Therapy

## 2016-03-13 DIAGNOSIS — M25662 Stiffness of left knee, not elsewhere classified: Secondary | ICD-10-CM | POA: Diagnosis present

## 2016-03-13 DIAGNOSIS — R2689 Other abnormalities of gait and mobility: Secondary | ICD-10-CM

## 2016-03-13 NOTE — Therapy (Signed)
[facility] [place], [place], [address] Phone: [phone]   Fax:  [phone]  Physical Therapy Treatment  Patient Details  Name: [name] [name] MRN: [id number] Date of Birth: Jul 31, 1950 Referring Provider: Hart Robinsons, MD  Encounter Date: 03/13/2016      PT End of Session - 03/13/16 0819    Visit Number 4   Number of Visits 12   Date for PT Re-Evaluation 04/14/16   PT Start Time 0819   PT Stop Time 0900   PT Time Calculation (min) 41 min   Activity Tolerance Patient tolerated treatment well   Behavior During Therapy [facility] for tasks assessed/performed      Past Medical History:  Diagnosis Date  . Coronary atherosclerosis of native coronary artery    Multivessel status post CABG, LVEF 50-55% (LIMA-LAD, SVG-D1, SVG-D2, SVG-OM)  . Essential hypertension, benign   . GERD (gastroesophageal reflux disease)   . Gout   . NSTEMI (non-ST elevated myocardial infarction) [facility])    October 2014  . OSA on CPAP   . PUD (peptic ulcer disease)    Diagnosed in the 1990s  . Type 2 diabetes mellitus ([place])     Past Surgical History:  Procedure Laterality Date  . CORONARY ARTERY BYPASS GRAFT N/A 04/25/2013   Procedure: CORONARY ARTERY BYPASS GRAFTING (CABG);  Surgeon: [name], MD;  Location: [place];  Service: Open Heart Surgery;  Laterality: N/A;  Coronary artery bypass graft times four, on pump, using left internal mammary artery and right greater saphenous vein via endovein harvest.  . INTRAOPERATIVE TRANSESOPHAGEAL ECHOCARDIOGRAM N/A 04/25/2013   Procedure: INTRAOPERATIVE TRANSESOPHAGEAL ECHOCARDIOGRAM;  Surgeon: [name], MD;  Location: [place];  Service: Open Heart Surgery;  Laterality: N/A;  . LEFT HEART CATHETERIZATION WITH CORONARY ANGIOGRAM N/A 04/20/2013   Procedure: LEFT HEART CATHETERIZATION WITH CORONARY ANGIOGRAM;  Surgeon: [name], MD;  Location: [facility] CATH LAB;  Service: Cardiovascular;  Laterality:  N/A;  . NASAL SEPTUM SURGERY  > 20 yr ago    There were no vitals filed for this visit.      Subjective Assessment - 03/13/16 0819    Subjective Reports that sometimes quick movements bother him.   Pertinent History DM, Heart Bypass 2014.   Patient Stated Goals Be able to move bettter. He has been limited the past few months.   Currently in Pain? No/denies            [facility] PT Assessment - 03/13/16 0001      Assessment   Medical Diagnosis L knee scope/debridement   Onset Date/Surgical Date 02/25/16   Next MD Visit 03/2016   Prior Therapy none     Precautions   Precautions None   Precaution Comments wearing soft brace     Restrictions   Weight Bearing Restrictions No                     [facility] Adult PT Treatment/Exercise - 03/13/16 0001      Knee/Hip Exercises: Stretches   Active Hamstring Stretch Left;3 reps;30 seconds     Knee/Hip Exercises: Aerobic   Nustep L5 x10 min     Knee/Hip Exercises: Standing   Forward Lunges Left;2 sets;10 reps   Terminal Knee Extension Limitations LLE red theraband 2x10 reps 3 sec hold   Lateral Step Up Left;3 sets;10 reps;Hand Hold: 2;Step Height: 6"   Forward Step Up Left;3 sets;10 reps;Hand Hold: 2;Step Height: 6"   Step Down Left;2 sets;10 reps;Hand  Hold: 2;Step Height: 4"   Rocker Board 2 minutes     Knee/Hip Exercises: Seated   Long Arc Quad Strengthening;Left;2 sets;10 reps;Weights   Long Arc Quad Weight 4 lbs.   Long Arc Quad Limitations 3 sec hold at end range     Knee/Hip Exercises: Supine   Heel Slides AROM;Left;2 sets;10 reps   Bridges Limitations 2x10 reps   Straight Leg Raises Strengthening;Left;3 sets;10 reps   Straight Leg Raise with External Rotation Strengthening;Left;3 sets;10 reps     Knee/Hip Exercises: Sidelying   Hip ABduction Strengthening;Left;3 sets;10 reps   Hip ADduction Strengthening;Left;3 sets;10 reps     Manual Therapy   Manual Therapy Soft tissue mobilization;Passive ROM   Soft  tissue mobilization L patellar mobilizations in sup/inf, lat/med directions, L incision mobilizations to improve proper mobility in supine   Passive ROM PROM of L knee into extension with gentle holds at end range                     PT Long Term Goals - 03/13/16 0901      PT LONG TERM GOAL #1   Title I with HEP   Time 4   Period Weeks   Status Achieved     PT LONG TERM GOAL #2   Title Patient able to ambulate with a normal gait pattern.   Time 4   Period Weeks   Status On-going     PT LONG TERM GOAL #3   Title Patient able to climb stairs with a reciprocal gait pattern without deviation.   Time 4   Period Weeks   Status On-going     PT LONG TERM GOAL #4   Title Demo improved L knee ROM 0-120 degrees to improve function.   Time 4   Period Weeks   Status Partially Met  AROM L knee 2-124 deg in supine 03/13/2016               Plan - 03/13/16 6222    Clinical Impression Statement Patient continues to complete therapeutic exercises well requiring minimal to moderate multimodal cueing for proper exercise technique and corrections. Patient continues to demonstrate good L Quad and VMO activation with exercises. Patient requires VCs for slow completion of exercises. Patient continues to ambulate without soft brace and with decreased L heel strike and knee flexion. Fairly good L patellar mobility noted with the patellar mobilizations and good incision mobility noted as well. AROM L knee measured as 2-124 deg in supine today.   Rehab Potential Excellent   PT Frequency 2x / week   PT Duration 4 weeks   PT Treatment/Interventions ADLs/Self Care Home Management;Electrical Stimulation;Gait training;Stair training;Therapeutic exercise;Balance training;Neuromuscular re-education;Patient/family education;Passive range of motion;Manual techniques   PT Next Visit Plan cont with POC for Patellar mobs, ROM, gait, stair and balance training. Modalities for pain prn.   PT Home  Exercise Plan HS stretch   Consulted and Agree with Plan of Care Patient      Patient will benefit from skilled therapeutic intervention in order to improve the following deficits and impairments:  Abnormal gait, Pain, Decreased balance, Impaired flexibility  Visit Diagnosis: Stiffness of left knee, not elsewhere classified  Other abnormalities of gait and mobility     Problem List Patient Active Problem List   Diagnosis Date Noted  . HLD (hyperlipidemia) 05/24/2013  . Coronary atherosclerosis of native coronary artery 05/24/2013  . S/P CABG x 4 04/28/2013  . Gout   . Type 2 diabetes  mellitus with other circulatory complications ([place])   . Essential hypertension, benign   . OSA on CPAP   . GERD (gastroesophageal reflux disease)     Wynelle Fanny, PTA 03/13/2016, 9:11 AM  [facility] [address] [place], [place], [address] Phone: [phone]   Fax:  [phone]  Name: [name] [name] MRN: [id number] Date of Birth: 10/01/1950

## 2016-03-25 ENCOUNTER — Ambulatory Visit: Payer: BC Managed Care – PPO | Admitting: Physical Therapy

## 2016-03-25 ENCOUNTER — Encounter: Payer: Self-pay | Admitting: Physical Therapy

## 2016-03-25 DIAGNOSIS — R2689 Other abnormalities of gait and mobility: Secondary | ICD-10-CM

## 2016-03-25 DIAGNOSIS — M25662 Stiffness of left knee, not elsewhere classified: Secondary | ICD-10-CM

## 2016-03-25 NOTE — Therapy (Signed)
[facility] [place], [place], [address] Phone: [phone]   Fax:  [phone]  Physical Therapy Treatment  Patient Details  Name: [name] [name] MRN: [id number] Date of Birth: 02/27/1951 Referring Provider: Hart Robinsons, MD  Encounter Date: 03/25/2016      PT End of Session - 03/25/16 0732    Visit Number 5   Number of Visits 12   Date for PT Re-Evaluation 04/14/16   PT Start Time 0730   PT Stop Time 0809   PT Time Calculation (min) 39 min   Activity Tolerance Patient tolerated treatment well   Behavior During Therapy [facility] for tasks assessed/performed      Past Medical History:  Diagnosis Date  . Coronary atherosclerosis of native coronary artery    Multivessel status post CABG, LVEF 50-55% (LIMA-LAD, SVG-D1, SVG-D2, SVG-OM)  . Essential hypertension, benign   . GERD (gastroesophageal reflux disease)   . Gout   . NSTEMI (non-ST elevated myocardial infarction) [facility])    October 2014  . OSA on CPAP   . PUD (peptic ulcer disease)    Diagnosed in the 1990s  . Type 2 diabetes mellitus ([place])     Past Surgical History:  Procedure Laterality Date  . CORONARY ARTERY BYPASS GRAFT N/A 04/25/2013   Procedure: CORONARY ARTERY BYPASS GRAFTING (CABG);  Surgeon: [name], MD;  Location: [place];  Service: Open Heart Surgery;  Laterality: N/A;  Coronary artery bypass graft times four, on pump, using left internal mammary artery and right greater saphenous vein via endovein harvest.  . INTRAOPERATIVE TRANSESOPHAGEAL ECHOCARDIOGRAM N/A 04/25/2013   Procedure: INTRAOPERATIVE TRANSESOPHAGEAL ECHOCARDIOGRAM;  Surgeon: [name], MD;  Location: [place];  Service: Open Heart Surgery;  Laterality: N/A;  . LEFT HEART CATHETERIZATION WITH CORONARY ANGIOGRAM N/A 04/20/2013   Procedure: LEFT HEART CATHETERIZATION WITH CORONARY ANGIOGRAM;  Surgeon: [name], MD;  Location: [facility] CATH LAB;  Service: Cardiovascular;  Laterality:  N/A;  . NASAL SEPTUM SURGERY  > 20 yr ago    There were no vitals filed for this visit.      Subjective Assessment - 03/25/16 0731    Subjective Reports that last week he and his wife were at the beach and walked a lot for several days. Reports he has stiffness in the mornings and reports that he walked with a stiff leg for so long that it became a habit.   Pertinent History DM, Heart Bypass 2014.   Patient Stated Goals Be able to move bettter. He has been limited the past few months.   Currently in Pain? Yes   Pain Score 6    Pain Location Knee   Pain Orientation Left   Pain Descriptors / Indicators Sore            [facility] PT Assessment - 03/25/16 0001      Assessment   Medical Diagnosis L knee scope/debridement   Onset Date/Surgical Date 02/25/16   Next MD Visit 04/01/2016   Prior Therapy none     Precautions   Precautions None     Restrictions   Weight Bearing Restrictions No     Observation/Other Assessments-Edema    Edema Circumferential     Circumferential Edema   Circumferential - Right 41.1 cm   Circumferential - Left  42.3 cm     ROM / Strength   AROM / PROM / Strength AROM     AROM   Overall AROM  Within functional limits for tasks performed  AROM Assessment Site Knee   Right/Left Knee Left   Left Knee Extension 0   Left Knee Flexion 124                     [facility] Adult PT Treatment/Exercise - 03/25/16 0001      Knee/Hip Exercises: Stretches   Active Hamstring Stretch Left;3 reps;30 seconds     Knee/Hip Exercises: Aerobic   Nustep L6 x10 min     Knee/Hip Exercises: Machines for Strengthening   Cybex Knee Extension 10# 3x10 reps   Cybex Knee Flexion 30# 3x10 reps     Knee/Hip Exercises: Standing   Forward Lunges Left;2 sets;10 reps   Terminal Knee Extension Limitations LLE Pink XTS x30 reps    Lateral Step Up Left;3 sets;10 reps;Hand Hold: 2;Step Height: 8"   Forward Step Up Left;3 sets;10 reps;Hand Hold: 2;Step Height: 8"    Step Down Left;3 sets;10 reps;Hand Hold: 2;Step Height: 4"   Rocker Board 3 minutes     Knee/Hip Exercises: Supine   Bridges Limitations 3x10 reps   Straight Leg Raises Strengthening;Left;3 sets;10 reps   Straight Leg Raise with External Rotation Strengthening;Left;3 sets;10 reps     Knee/Hip Exercises: Sidelying   Hip ABduction Strengthening;Left;3 sets;10 reps   Hip ADduction Strengthening;Left;3 sets;10 reps     Manual Therapy   Manual Therapy Soft tissue mobilization;Passive ROM   Soft tissue mobilization L patellar mobilizations in sup/inf, lat/med directions, L incision mobilizations to improve proper mobility in supine   Passive ROM PROM of L knee into extension with gentle holds at end range                     PT Long Term Goals - 03/25/16 0810      PT LONG TERM GOAL #1   Title I with HEP   Time 4   Period Weeks   Status Achieved     PT LONG TERM GOAL #2   Title Patient able to ambulate with a normal gait pattern.   Time 4   Period Weeks   Status On-going     PT LONG TERM GOAL #3   Title Patient able to climb stairs with a reciprocal gait pattern without deviation.   Time 4   Period Weeks   Status On-going     PT LONG TERM GOAL #4   Title Demo improved L knee ROM 0-120 degrees to improve function.   Time 4   Period Weeks   Status Achieved  AROM L knee 0-124 deg in supine 03/25/2016               Plan - 03/25/16 [id number]    Clinical Impression Statement Patient continues to progress accordingly following L knee arthroscopic surgery. Patient able to advance with strengthening to increased repititions, higher step height and machine strengthening without complaint. Patient did not verbalize or report any increased pain with any exercises. Patient denies any tendenress with palpation around the L patella today. Patient continues to ambulate with decreased L knee flexion at this time without AD or soft brace. Able to fully achieve L knee ROM goal  today with 0-124 deg measured in supine.   Rehab Potential Excellent   PT Frequency 2x / week   PT Duration 4 weeks   PT Treatment/Interventions ADLs/Self Care Home Management;Electrical Stimulation;Gait training;Stair training;Therapeutic exercise;Balance training;Neuromuscular re-education;Patient/family education;Passive range of motion;Manual techniques   PT Next Visit Plan cont with POC for Patellar mobs, ROM, gait, stair  and balance training. Modalities for pain prn.   PT Home Exercise Plan HS stretch   Consulted and Agree with Plan of Care Patient      Patient will benefit from skilled therapeutic intervention in order to improve the following deficits and impairments:  Abnormal gait, Pain, Decreased balance, Impaired flexibility  Visit Diagnosis: Stiffness of left knee, not elsewhere classified  Other abnormalities of gait and mobility     Problem List Patient Active Problem List   Diagnosis Date Noted  . HLD (hyperlipidemia) 05/24/2013  . Coronary atherosclerosis of native coronary artery 05/24/2013  . S/P CABG x 4 04/28/2013  . Gout   . Type 2 diabetes mellitus with other circulatory complications ([place])   . Essential hypertension, benign   . OSA on CPAP   . GERD (gastroesophageal reflux disease)     Wynelle Fanny, PTA 03/25/2016, 8:14 AM  [facility] [address] [place], [place], [address] Phone: [phone]   Fax:  [phone]  Name: QUENTYN CHEVALIER MRN: [id number] Date of Birth: [birth year]

## 2016-03-27 ENCOUNTER — Ambulatory Visit: Payer: BC Managed Care – PPO | Admitting: Physical Therapy

## 2016-03-27 ENCOUNTER — Encounter: Payer: Self-pay | Admitting: Physical Therapy

## 2016-03-27 DIAGNOSIS — M25662 Stiffness of left knee, not elsewhere classified: Secondary | ICD-10-CM | POA: Diagnosis not present

## 2016-03-27 DIAGNOSIS — R2689 Other abnormalities of gait and mobility: Secondary | ICD-10-CM

## 2016-03-27 NOTE — Therapy (Addendum)
[facility] [place], [place], [address] Phone: [phone]   Fax:  [phone]  Physical Therapy Treatment  Patient Details  Name: [name] [name] MRN: [id number] Date of Birth: 04/27/1951 Referring Provider: Hart Robinsons, MD  Encounter Date: 03/27/2016      PT End of Session - 03/27/16 0738    Visit Number 6   Number of Visits 12   Date for PT Re-Evaluation 04/14/16   PT Start Time 0731   PT Stop Time 0803   PT Time Calculation (min) 32 min   Activity Tolerance Patient tolerated treatment well   Behavior During Therapy [facility] for tasks assessed/performed      Past Medical History:  Diagnosis Date  . Coronary atherosclerosis of native coronary artery    Multivessel status post CABG, LVEF 50-55% (LIMA-LAD, SVG-D1, SVG-D2, SVG-OM)  . Essential hypertension, benign   . GERD (gastroesophageal reflux disease)   . Gout   . NSTEMI (non-ST elevated myocardial infarction) [facility])    October 2014  . OSA on CPAP   . PUD (peptic ulcer disease)    Diagnosed in the 1990s  . Type 2 diabetes mellitus ([place])     Past Surgical History:  Procedure Laterality Date  . CORONARY ARTERY BYPASS GRAFT N/A 04/25/2013   Procedure: CORONARY ARTERY BYPASS GRAFTING (CABG);  Surgeon: [name], MD;  Location: [place];  Service: Open Heart Surgery;  Laterality: N/A;  Coronary artery bypass graft times four, on pump, using left internal mammary artery and right greater saphenous vein via endovein harvest.  . INTRAOPERATIVE TRANSESOPHAGEAL ECHOCARDIOGRAM N/A 04/25/2013   Procedure: INTRAOPERATIVE TRANSESOPHAGEAL ECHOCARDIOGRAM;  Surgeon: [name], MD;  Location: [place];  Service: Open Heart Surgery;  Laterality: N/A;  . LEFT HEART CATHETERIZATION WITH CORONARY ANGIOGRAM N/A 04/20/2013   Procedure: LEFT HEART CATHETERIZATION WITH CORONARY ANGIOGRAM;  Surgeon: [name], MD;  Location: [facility] CATH LAB;  Service: Cardiovascular;  Laterality:  N/A;  . NASAL SEPTUM SURGERY  > 20 yr ago    There were no vitals filed for this visit.      Subjective Assessment - 03/27/16 0732    Subjective Reports soreness from previous treatment.   Pertinent History DM, Heart Bypass 2014.   Patient Stated Goals Be able to move bettter. He has been limited the past few months.   Currently in Pain? Yes   Pain Score 4    Pain Location Knee   Pain Orientation Left   Pain Descriptors / Indicators Sore            [facility] PT Assessment - 03/27/16 0001      Assessment   Medical Diagnosis L knee scope/debridement   Onset Date/Surgical Date 02/25/16   Next MD Visit 04/01/2016   Prior Therapy none     Precautions   Precautions None     Restrictions   Weight Bearing Restrictions No                     [facility] Adult PT Treatment/Exercise - 03/27/16 0001      Ambulation/Gait   Stairs Yes   Stairs Assistance 6: Modified independent (Device/Increase time)   Stair Management Technique One rail Right;Alternating pattern;Forwards   Number of Stairs 8   Height of Stairs 6     Knee/Hip Exercises: Aerobic   Nustep L8 x12 min     Knee/Hip Exercises: Machines for Strengthening   Cybex Knee Extension 10# 3x10 reps   Cybex Knee  Flexion 40# 3x10 reps   Cybex Leg Press 2pl, seat 7, 3x10 reps     Knee/Hip Exercises: Standing   Terminal Knee Extension Limitations LLE Pink XTS x30 reps    Lateral Step Up Left;3 sets;10 reps;Hand Hold: 2;Step Height: 8"   Forward Step Up Left;3 sets;10 reps;Hand Hold: 2;Step Height: 8"   Step Down Left;3 sets;10 reps;Hand Hold: 2;Step Height: 4"   Wall Squat 3 sets;10 reps     Knee/Hip Exercises: Supine   Bridges Limitations 3x10 reps   Straight Leg Raises Strengthening;Left;3 sets;10 reps   Straight Leg Raise with External Rotation Strengthening;Left;3 sets;10 reps     Knee/Hip Exercises: Sidelying   Hip ABduction Strengthening;Left;3 sets;10 reps   Hip ADduction Strengthening;Left;3 sets;10 reps      Knee/Hip Exercises: Prone   Hip Extension AROM;Left;3 sets;10 reps                     PT Long Term Goals - 03/27/16 0804      PT LONG TERM GOAL #1   Title I with HEP   Time 4   Period Weeks   Status Achieved     PT LONG TERM GOAL #2   Title Patient able to ambulate with a normal gait pattern.   Time 4   Period Weeks   Status Partially Met  Patient reports that it is a habit and he catches himself limping sometimes 03/27/2016     PT LONG TERM GOAL #3   Title Patient able to climb stairs with a reciprocal gait pattern without deviation.   Time 4   Period Weeks   Status Partially Met  Ascends stairs well although descending is slightly more difficult for patient 03/27/2016     PT LONG TERM GOAL #4   Title Demo improved L knee ROM 0-120 degrees to improve function.   Time 4   Period Weeks   Status Achieved  AROM L knee 0-124 deg in supine 03/25/2016               Plan - 03/27/16 0806    Clinical Impression Statement Patient has progressed well following L knee arthroscopic surgery. Patient has reported soreness but no persistant pain. Patient able to progress to machine strengthening without complaint and able to progress with resistance. Patient ambulates with decreased L knee/hip flexion with antalgic deviation at times to which patient reports as habit. Descending stairs slightly more difficult for patient than ascending stairs reciprically upon observation. AROM L knee measured as 0-124 deg in previous treatment.   Rehab Potential Excellent   PT Frequency 2x / week   PT Duration 4 weeks   PT Treatment/Interventions ADLs/Self Care Home Management;Electrical Stimulation;Gait training;Stair training;Therapeutic exercise;Balance training;Neuromuscular re-education;Patient/family education;Passive range of motion;Manual techniques   PT Next Visit Plan Continue or discharge per MD discretion.   PT Home Exercise Plan HS stretch   Consulted and Agree with  Plan of Care Patient      Patient will benefit from skilled therapeutic intervention in order to improve the following deficits and impairments:  Abnormal gait, Pain, Decreased balance, Impaired flexibility  Visit Diagnosis: Stiffness of left knee, not elsewhere classified  Other abnormalities of gait and mobility     Problem List Patient Active Problem List   Diagnosis Date Noted  . HLD (hyperlipidemia) 05/24/2013  . Coronary atherosclerosis of native coronary artery 05/24/2013  . S/P CABG x 4 04/28/2013  . Gout   . Type 2 diabetes mellitus with other circulatory  complications ([place])   . Essential hypertension, benign   . OSA on CPAP   . GERD (gastroesophageal reflux disease)     Ahmed Prima, PTA 03/27/16 12:57 PM  [facility] [address] [place], [place], [address] Phone: [phone]   Fax:  [phone]  Name: [name] [name] MRN: [id number] Date of Birth: [birth year]  PHYSICAL THERAPY DISCHARGE SUMMARY  Visits from Start of Care: 6.  Current functional level related to goals / functional outcomes: See above.   Remaining deficits: Very good progress  See goal section above.   Education / Equipment: HEP. Plan: Patient agrees to discharge.  Patient goals were partially met. Patient is being discharged due to being pleased with the current functional level.  ?????         Mali Applegate MPT

## 2016-04-17 NOTE — Progress Notes (Signed)
Cardiology Office Note  Date: 04/20/2016   ID: [name] [name], DOB [birth year], MRN [id number]  PCP: Rory Percy, MD  Primary Cardiologist: [name], MD   Chief Complaint  Patient presents with  . Coronary Artery Disease    History of Present Illness: [name] [name] is a 65 y.o. male last seen in April. He presents for a routine follow-up visit. Doing well from a cardiac perspective, no reported angina and stable NYHA class II dyspnea. Atypical postsurgical thoracic discomfort has improved significantly.  He had interval left knee arthroscopic surgery done by Dr. Theda Sers. He has completed rehabilitation and has better mobility. He remains active, enjoys riding his motorcycle with a group on long trips.  He follows with Dr. [name] from management of type 2 diabetes and lipids. I reviewed his most recent lab work as outlined below.  We went over his medications today. He reports no intolerances.  Past Medical History:  Diagnosis Date  . Coronary atherosclerosis of native coronary artery    Multivessel status post CABG, LVEF 50-55% (LIMA-LAD, SVG-D1, SVG-D2, SVG-OM)  . Essential hypertension, benign   . GERD (gastroesophageal reflux disease)   . Gout   . NSTEMI (non-ST elevated myocardial infarction) [facility])    October 2014  . OSA on CPAP   . PUD (peptic ulcer disease)    Diagnosed in the 1990s  . Type 2 diabetes mellitus ([place])     Past Surgical History:  Procedure Laterality Date  . CORONARY ARTERY BYPASS GRAFT N/A 04/25/2013   Procedure: CORONARY ARTERY BYPASS GRAFTING (CABG);  Surgeon: [name], MD;  Location: [place];  Service: Open Heart Surgery;  Laterality: N/A;  Coronary artery bypass graft times four, on pump, using left internal mammary artery and right greater saphenous vein via endovein harvest.  . INTRAOPERATIVE TRANSESOPHAGEAL ECHOCARDIOGRAM N/A 04/25/2013   Procedure: INTRAOPERATIVE TRANSESOPHAGEAL ECHOCARDIOGRAM;  Surgeon: [name], MD;   Location: [place];  Service: Open Heart Surgery;  Laterality: N/A;  . LEFT HEART CATHETERIZATION WITH CORONARY ANGIOGRAM N/A 04/20/2013   Procedure: LEFT HEART CATHETERIZATION WITH CORONARY ANGIOGRAM;  Surgeon: [name], MD;  Location: [facility] CATH LAB;  Service: Cardiovascular;  Laterality: N/A;  . NASAL SEPTUM SURGERY  > 20 yr ago    Current Outpatient Prescriptions  Medication Sig Dispense Refill  . aspirin EC 325 MG EC tablet Take 1 tablet (325 mg total) by mouth daily. 30 tablet 0  . colchicine 0.6 MG tablet Take 0.6 mg by mouth as needed.    Marland Kitchen escitalopram (LEXAPRO) 10 MG tablet Take 10 mg by mouth daily.    Marland Kitchen lovastatin (MEVACOR) 20 MG tablet TAKE ONE TABLET BY MOUTH AT BEDTIME, STOP TAKING ATORVASTATIN 90 tablet 3  . metFORMIN (GLUCOPHAGE) 500 MG tablet Take 1 tablet (500 mg total) by mouth 2 (two) times daily. 180 tablet 0  . metoprolol tartrate (LOPRESSOR) 25 MG tablet Take 0.5 tablets (12.5 mg total) by mouth 2 (two) times daily. 90 tablet 3  . Multiple Vitamin (MULTIVITAMIN WITH MINERALS) TABS tablet Take 1 tablet by mouth daily.    Marland Kitchen omeprazole (PRILOSEC) 20 MG capsule Take 20 mg by mouth daily.     No current facility-administered medications for this visit.    Allergies:  Review of patient's allergies indicates no known allergies.   Social History: The patient  reports that he has never smoked. He has never used smokeless tobacco. He reports that he does not drink alcohol or use drugs.   ROS:  Please see the history of present illness. Otherwise, complete review of systems is positive for none.  All other systems are reviewed and negative.   Physical Exam: VS:  BP 118/64   Pulse 60   Ht 6\' 1"  (1.854 m)   Wt 224 lb (101.6 kg)   SpO2 93%   BMI 29.55 kg/m , BMI Body mass index is 29.55 kg/m.  Wt Readings from Last 3 Encounters:  04/20/16 224 lb (101.6 kg)  03/10/16 221 lb (100.2 kg)  10/21/15 218 lb (98.9 kg)    Patient appears comfortable at rest.  HEENT:  Conjunctiva and lids normal, oropharynx clear.  Neck: Supple, no elevated JVP or carotid bruits, no thyromegaly.  Lungs: Clear to auscultation, nonlabored breathing at rest.  Thorax: Well-healed sternal incision.  Cardiac: Regular rate and rhythm, no S3 or significant systolic murmur, no pericardial rub.  Abdomen: Soft, nontender, bowel sounds present.  Extremities: No pitting edema, distal pulses 2+.  Skin: Warm and dry. Musculoskeletal: No kyphosis. Neuropsychiatric: Alert and oriented 3, affect appropriate.  ECG: I personally reviewed the tracing from 10/21/2015 which showed sinus bradycardia with prolonged PR interval and nonspecific T-wave changes.  Recent Labwork: 02/29/2016: BUN 25; Creat 1.23; Potassium 4.3; Sodium 141  April 2015: Cholesterol 85, HDL 36, triglycerides 85, HDL 32  Other Studies Reviewed Today:  Echocardiogram 04/20/2013: Study Conclusions  - Left ventricle: The cavity size was normal. There was focal basal hypertrophy. Systolic function was normal. The estimated ejection fraction was in the range of 50% to 55%. Wall motion was normal; there were no regional wall motion abnormalities. - Left atrium: The atrium was mildly dilated. - Right atrium: The atrium was mildly dilated.  Assessment and Plan:  1. Multivessel CAD status post CABG in October 2014. I reviewed his ECG from earlier in the year. He is not reporting any angina symptoms at this time on medical therapy. Encouraged regular activity and observation for now.  2. Essential hypertension, blood pressure is well controlled today. He remains on low-dose Lopressor.  3. Hyperlipidemia, on Mevacor. Lipids have been followed by Dr. [name].  4. Type 2 diabetes mellitus, followed by Dr. [name]. He is on Glucophage, last hemoglobin A1c 6.4%.  Current medicines were reviewed with the patient today.  Disposition: Follow-up with me in 6 months.  Signed, [name], MD, [facility] 04/20/2016  8:20 AM    [facility] [place], [place], [place] [address] Phone: [phone]; Fax: [phone]

## 2016-04-20 ENCOUNTER — Encounter: Payer: Self-pay | Admitting: Cardiology

## 2016-04-20 ENCOUNTER — Ambulatory Visit (INDEPENDENT_AMBULATORY_CARE_PROVIDER_SITE_OTHER): Payer: BC Managed Care – PPO | Admitting: Cardiology

## 2016-04-20 VITALS — BP 118/64 | HR 60 | Ht 73.0 in | Wt 224.0 lb

## 2016-04-20 DIAGNOSIS — I10 Essential (primary) hypertension: Secondary | ICD-10-CM

## 2016-04-20 DIAGNOSIS — I2510 Atherosclerotic heart disease of native coronary artery without angina pectoris: Secondary | ICD-10-CM | POA: Diagnosis not present

## 2016-04-20 DIAGNOSIS — E782 Mixed hyperlipidemia: Secondary | ICD-10-CM

## 2016-04-20 DIAGNOSIS — E119 Type 2 diabetes mellitus without complications: Secondary | ICD-10-CM | POA: Diagnosis not present

## 2016-04-20 NOTE — Patient Instructions (Signed)

## 2016-04-22 ENCOUNTER — Other Ambulatory Visit: Payer: Self-pay | Admitting: "Endocrinology

## 2016-07-02 ENCOUNTER — Other Ambulatory Visit: Payer: Self-pay | Admitting: "Endocrinology

## 2016-07-02 LAB — LIPID PANEL
CHOL/HDL RATIO: 4.8 ratio (ref <5.0)
Cholesterol: 114 mg/dL (ref <200)
HDL: 24 mg/dL — ABNORMAL LOW (ref >40)
LDL CALC: 69 mg/dL (ref <100)
Triglycerides: 106 mg/dL (ref <150)
VLDL: 21 mg/dL (ref <30)

## 2016-07-02 LAB — COMPLETE METABOLIC PANEL WITH GFR
ALBUMIN: 3.8 g/dL (ref 3.6–5.1)
ALK PHOS: 83 U/L (ref 40–115)
ALT: 17 U/L (ref 9–46)
AST: 18 U/L (ref 10–35)
BILIRUBIN TOTAL: 0.7 mg/dL (ref 0.2–1.2)
BUN: 18 mg/dL (ref 7–25)
CALCIUM: 8.7 mg/dL (ref 8.6–10.3)
CO2: 25 mmol/L (ref 20–31)
CREATININE: 1.3 mg/dL — AB (ref 0.70–1.25)
Chloride: 108 mmol/L (ref 98–110)
GFR, Est African American: 67 mL/min (ref >=60)
GFR, Est Non African American: 58 mL/min — ABNORMAL LOW (ref >=60)
GLUCOSE: 146 mg/dL — AB (ref 65–99)
Potassium: 4.5 mmol/L (ref 3.5–5.3)
Sodium: 140 mmol/L (ref 135–146)
TOTAL PROTEIN: 5.8 g/dL — AB (ref 6.1–8.1)

## 2016-07-02 LAB — HEMOGLOBIN A1C
Hgb A1c MFr Bld: 6.4 % — ABNORMAL HIGH (ref <5.7)
MEAN PLASMA GLUCOSE: 137 mg/dL

## 2016-07-03 ENCOUNTER — Other Ambulatory Visit: Payer: Self-pay | Admitting: "Endocrinology

## 2016-07-03 LAB — MICROALBUMIN / CREATININE URINE RATIO
CREATININE, URINE: 157 mg/dL (ref 20–370)
Microalb Creat Ratio: 9 mcg/mg creat (ref <30)
Microalb, Ur: 1.4 mg/dL

## 2016-07-10 ENCOUNTER — Encounter: Payer: Self-pay | Admitting: "Endocrinology

## 2016-07-10 ENCOUNTER — Ambulatory Visit (INDEPENDENT_AMBULATORY_CARE_PROVIDER_SITE_OTHER): Payer: BC Managed Care – PPO | Admitting: "Endocrinology

## 2016-07-10 VITALS — BP 127/76 | HR 59 | Ht 73.0 in | Wt 231.0 lb

## 2016-07-10 DIAGNOSIS — I10 Essential (primary) hypertension: Secondary | ICD-10-CM

## 2016-07-10 DIAGNOSIS — E1159 Type 2 diabetes mellitus with other circulatory complications: Secondary | ICD-10-CM

## 2016-07-10 DIAGNOSIS — E782 Mixed hyperlipidemia: Secondary | ICD-10-CM

## 2016-07-10 NOTE — Progress Notes (Signed)
Subjective:    Patient ID: [name] [name], male    DOB: 1950/11/09, PCP Rory Percy, MD   Past Medical History:  Diagnosis Date  . Coronary atherosclerosis of native coronary artery    Multivessel status post CABG, LVEF 50-55% (LIMA-LAD, SVG-D1, SVG-D2, SVG-OM)  . Essential hypertension, benign   . GERD (gastroesophageal reflux disease)   . Gout   . NSTEMI (non-ST elevated myocardial infarction) [facility])    October 2014  . OSA on CPAP   . PUD (peptic ulcer disease)    Diagnosed in the 1990s  . Type 2 diabetes mellitus ([place])    Past Surgical History:  Procedure Laterality Date  . CORONARY ARTERY BYPASS GRAFT N/A 04/25/2013   Procedure: CORONARY ARTERY BYPASS GRAFTING (CABG);  Surgeon: [name], MD;  Location: [place];  Service: Open Heart Surgery;  Laterality: N/A;  Coronary artery bypass graft times four, on pump, using left internal mammary artery and right greater saphenous vein via endovein harvest.  . INTRAOPERATIVE TRANSESOPHAGEAL ECHOCARDIOGRAM N/A 04/25/2013   Procedure: INTRAOPERATIVE TRANSESOPHAGEAL ECHOCARDIOGRAM;  Surgeon: [name], MD;  Location: [place];  Service: Open Heart Surgery;  Laterality: N/A;  . LEFT HEART CATHETERIZATION WITH CORONARY ANGIOGRAM N/A 04/20/2013   Procedure: LEFT HEART CATHETERIZATION WITH CORONARY ANGIOGRAM;  Surgeon: [name], MD;  Location[facility] C[facility] LAB;  Service: Cardiovascular;  Laterality: N/A;  . NASAL SEPTUM SURGERY  > 20 yr ago   Social History   Social History  . Marital status: Married    Spouse name: N/A  . Number of children: N/A  . Years of education: N/A   Occupational History  . Drives truck Mining engineer   Social History Main Topics  . Smoking status: Never Smoker  . Smokeless tobacco: Never Used  . Alcohol use No  . Drug use: No  . Sexual activity: Not Asked   Other Topics Concern  . None   Social History Narrative   Lives with wife, does not exercise but part of his job is strenuous.  Works in a body-shop part-time, too.   2 of his mother's sisters and 1 brother died suddenly (family was told MI but no autopsy).    Outpatient Encounter Prescriptions as of 07/10/2016  Medication Sig  . aspirin EC 325 MG EC tablet Take 1 tablet (325 mg total) by mouth daily.  . colchicine 0.6 MG tablet Take 0.6 mg by mouth as needed.  Marland Kitchen escitalopram (LEXAPRO) 10 MG tablet Take 10 mg by mouth daily.  Marland Kitchen lovastatin (MEVACOR) 20 MG tablet TAKE ONE TABLET BY MOUTH AT BEDTIME, STOP TAKING ATORVASTATIN  . metFORMIN (GLUCOPHAGE) 500 MG tablet TAKE 1 TABLET TWICE A DAY  . metoprolol tartrate (LOPRESSOR) 25 MG tablet Take 0.5 tablets (12.5 mg total) by mouth 2 (two) times daily.  . Multiple Vitamin (MULTIVITAMIN WITH MINERALS) TABS tablet Take 1 tablet by mouth daily.  Marland Kitchen omeprazole (PRILOSEC) 20 MG capsule Take 20 mg by mouth daily.   No facility-administered encounter medications on file as of 07/10/2016.    ALLERGIES: No Known Allergies VACCINATION STATUS: There is no immunization history for the selected administration types on file for this patient.  Diabetes  He presents for his follow-up diabetic visit. He has type 2 diabetes mellitus. His disease course has been stable. There are no hypoglycemic associated symptoms. Pertinent negatives for hypoglycemia include no confusion, headaches, pallor or seizures. There are no diabetic associated symptoms. Pertinent negatives for diabetes include no chest pain, no  fatigue, no polydipsia, no polyphagia, no polyuria and no weakness. There are no hypoglycemic complications. Symptoms are stable. Diabetic complications include heart disease. Risk factors for coronary artery disease include diabetes mellitus, dyslipidemia, hypertension, male sex and sedentary lifestyle. He is compliant with treatment most of the time. His weight is increasing steadily. He is following a generally unhealthy diet. When asked about meal planning, he reported none. He never  participates in exercise.  Hyperlipidemia  This is a chronic problem. The current episode started more than 1 year ago. Pertinent negatives include no chest pain, myalgias or shortness of breath. Current antihyperlipidemic treatment includes statins. Risk factors for coronary artery disease include diabetes mellitus, dyslipidemia and hypertension.  Hypertension  This is a chronic problem. Pertinent negatives include no chest pain, headaches, neck pain, palpitations or shortness of breath. Risk factors for coronary artery disease include dyslipidemia, diabetes mellitus, male gender and sedentary lifestyle. Past treatments include beta blockers.     Review of Systems  Constitutional: Negative for chills, fatigue, fever and unexpected weight change.  HENT: Negative for dental problem, mouth sores and trouble swallowing.   Eyes: Negative for visual disturbance.  Respiratory: Negative for cough, choking, chest tightness, shortness of breath and wheezing.   Cardiovascular: Negative for chest pain, palpitations and leg swelling.  Gastrointestinal: Negative for abdominal distention, abdominal pain, constipation, diarrhea, nausea and vomiting.  Endocrine: Negative for polydipsia, polyphagia and polyuria.  Genitourinary: Negative for dysuria, flank pain, hematuria and urgency.  Musculoskeletal: Negative for back pain, gait problem, myalgias and neck pain.  Skin: Negative for pallor, rash and wound.  Neurological: Negative for seizures, syncope, weakness, numbness and headaches.  Psychiatric/Behavioral: Negative.  Negative for confusion and dysphoric mood.    Objective:    BP 127/76   Pulse (!) 59   Ht 6\' 1"  (1.854 m)   Wt 231 lb (104.8 kg)   BMI 30.48 kg/m   Wt Readings from Last 3 Encounters:  07/10/16 231 lb (104.8 kg)  04/20/16 224 lb (101.6 kg)  03/10/16 221 lb (100.2 kg)    Physical Exam  Constitutional: He is oriented to person, place, and time. He appears well-developed and  well-nourished. He is cooperative. No distress.  HENT:  Head: Normocephalic and atraumatic.  Eyes: EOM are normal.  Neck: Normal range of motion. Neck supple. No tracheal deviation present. No thyromegaly present.  Cardiovascular: Normal rate, S1 normal, S2 normal and normal heart sounds.  Exam reveals no gallop.   No murmur heard. Pulses:      Dorsalis pedis pulses are 1+ on the right side, and 1+ on the left side.       Posterior tibial pulses are 1+ on the right side, and 1+ on the left side.  Pulmonary/Chest: Breath sounds normal. No respiratory distress. He has no wheezes.  Abdominal: Soft. Bowel sounds are normal. He exhibits no distension. There is no tenderness. There is no guarding and no CVA tenderness.  Musculoskeletal: He exhibits no edema.       Right shoulder: He exhibits no swelling and no deformity.  Neurological: He is alert and oriented to person, place, and time. He has normal strength and normal reflexes. No cranial nerve deficit or sensory deficit. Gait normal.  Skin: Skin is warm and dry. No rash noted. No cyanosis. Nails show no clubbing.  Psychiatric: He has a normal mood and affect. His speech is normal and behavior is normal. Judgment and thought content normal. Cognition and memory are normal.     CMP  Component Value Date/Time   NA 140 07/02/2016 0853   K 4.5 07/02/2016 0853   CL 108 07/02/2016 0853   CO2 25 07/02/2016 0853   GLUCOSE 146 (H) 07/02/2016 0853   BUN 18 07/02/2016 0853   CREATININE 1.30 (H) 07/02/2016 0853   CALCIUM 8.7 07/02/2016 0853   PROT 5.8 (L) 07/02/2016 0853   ALBUMIN 3.8 07/02/2016 0853   AST 18 07/02/2016 0853   ALT 17 07/02/2016 0853   ALKPHOS 83 07/02/2016 0853   BILITOT 0.7 07/02/2016 0853   GFRNONAA 58 (L) 07/02/2016 0853   GFRAA 67 07/02/2016 0853     Diabetic Labs (most recent): Lab Results  Component Value Date   HGBA1C 6.4 (H) 07/02/2016   HGBA1C 6.4 (H) 02/29/2016   HGBA1C 5.9 08/17/2015     Lipid Panel (  most recent) Lipid Panel     Component Value Date/Time   CHOL 114 07/02/2016 0853   TRIG 106 07/02/2016 0853   HDL 24 (L) 07/02/2016 0853   CHOLHDL 4.8 07/02/2016 0853   VLDL 21 07/02/2016 0853   LDLCALC 69 07/02/2016 0853     Assessment & Plan:   1. Type 2 diabetes mellitus with other circulatory complications (HCC)   His diabetes is  complicated by coronary artery disease status post coronary artery bypass graft and patient remains at a high risk for more acute and chronic complications of diabetes which include CAD, CVA, CKD, retinopathy, and neuropathy. These are all discussed in detail with the patient.  Patient came with controlled diabetes with A1c of 6.4%, Staying stable from last visit. Recent labs reviewed.   - I have re-counseled the patient on diet management  by adopting a carbohydrate restricted / protein rich  Diet.  - Suggestion is made for patient to avoid simple carbohydrates   from their diet including Cakes , Desserts, Ice Cream,  Soda (  diet and regular) , Sweet Tea , Candies,  Chips, Cookies, Artificial Sweeteners,   and "Sugar-free" Products .  This will help patient to have stable blood glucose profile and potentially avoid unintended  Weight gain.  - Patient is advised to stick to a routine mealtimes to eat 3 meals  a day and avoid unnecessary snacks ( to snack only to correct hypoglycemia).  - I have approached patient with the following individualized plan to manage diabetes and patient agrees.  - I will continue metformin 500 mg by mouth daily, therapeutically suitable for patient. His serum creatinine and renal function is back to normal. I have advised patient to keep adequate hydration and will repeat his renal function before his next visit.  - Patient will be considered for incretin therapy as appropriate next visit if he loses control. - Patient specific target  for A1c; LDL, HDL, Triglycerides, and  Waist Circumference were discussed in  detail.  2) BP/HTN: Controlled. Continue current medications including beta blockers. 3) Lipids/HPL:  continue statins. 4)  Weight/Diet:  He is gaining weight progressively, exercise, and carbohydrates information provided.  5) Chronic Care/Health Maintenance:  -Patient is on  Statin medications and encouraged to continue to follow up with Ophthalmology, Podiatrist at least yearly or according to recommendations, and advised to  stay away from smoking. I have recommended yearly flu vaccine and pneumonia vaccination at least every 5 years; moderate intensity exercise for up to 150 minutes weekly; and  sleep for at least 7 hours a day.  - 25 minutes of time was spent on the care of this patient ,  50% of which was applied for counseling on diabetes complications and their preventions.  - I advised patient to maintain close follow up with Rory Percy, MD for primary care needs.  Patient is asked to bring meter and  blood glucose logs during their next visit.   Follow up plan: -Return in about 6 months (around 01/08/2017) for follow up with pre-visit labs.  Glade Lloyd, MD Phone: [phone]  Fax: [phone]   07/10/2016, 9:08 AM

## 2016-10-05 ENCOUNTER — Other Ambulatory Visit: Payer: Self-pay | Admitting: "Endocrinology

## 2016-11-06 NOTE — Progress Notes (Signed)
Cardiology Office Note  Date: 11/09/2016   ID: [name] [name], DOB 1950-12-05, MRN [id number]  PCP: [name] Ota, MD  Primary Cardiologist: [name] Lesches, MD   Chief Complaint  Patient presents with  . Coronary Artery Disease    History of Present Illness: [name] [name] is a 66 y.o. male last seen in October 2017. He presents for a routine follow-up visit with his wife. Reports only occasional angina symptoms with activity. He has noticed some episodes of nausea and emesis occurred with emotional upset.  I reviewed his medications which are outlined below. Cardiac regimen includes aspirin, Mevacor, Lopressor, and omega-3 supplements.  He continues to follow with Dr. [name] for endocrinology.  Bypass surgery was back in 2014. We discussed obtaining a follow-up ischemic evaluation in light of his symptoms.  Past Medical History:  Diagnosis Date  . Coronary atherosclerosis of native coronary artery    Multivessel status post CABG, LVEF 50-55% (LIMA-LAD, SVG-D1, SVG-D2, SVG-OM)  . Essential hypertension, benign   . GERD (gastroesophageal reflux disease)   . Gout   . NSTEMI (non-ST elevated myocardial infarction) [facility])    October 2014  . OSA on CPAP   . PUD (peptic ulcer disease)    Diagnosed in the 1990s  . Type 2 diabetes mellitus ([place])     Past Surgical History:  Procedure Laterality Date  . CORONARY ARTERY BYPASS GRAFT N/A 04/25/2013   Procedure: CORONARY ARTERY BYPASS GRAFTING (CABG);  Surgeon: [name] Pollack, MD;  Location: [place];  Service: Open Heart Surgery;  Laterality: N/A;  Coronary artery bypass graft times four, on pump, using left internal mammary artery and right greater saphenous vein via endovein harvest.  . INTRAOPERATIVE TRANSESOPHAGEAL ECHOCARDIOGRAM N/A 04/25/2013   Procedure: INTRAOPERATIVE TRANSESOPHAGEAL ECHOCARDIOGRAM;  Surgeon: [name] Pollack, MD;  Location: [place];  Service: Open Heart Surgery;  Laterality: N/A;  . LEFT HEART  CATHETERIZATION WITH CORONARY ANGIOGRAM N/A 04/20/2013   Procedure: LEFT HEART CATHETERIZATION WITH CORONARY ANGIOGRAM;  Surgeon: [name] Ohara, MD;  Location: [facility] CATH LAB;  Service: Cardiovascular;  Laterality: N/A;  . NASAL SEPTUM SURGERY  > 20 yr ago    Current Outpatient Prescriptions  Medication Sig Dispense Refill  . aspirin EC 325 MG EC tablet Take 1 tablet (325 mg total) by mouth daily. 30 tablet 0  . colchicine 0.6 MG tablet Take 0.6 mg by mouth as needed.    Marland Kitchen escitalopram (LEXAPRO) 10 MG tablet Take 10 mg by mouth daily.    Marland Kitchen lovastatin (MEVACOR) 20 MG tablet TAKE ONE TABLET BY MOUTH AT BEDTIME, STOP TAKING ATORVASTATIN 90 tablet 3  . metFORMIN (GLUCOPHAGE) 500 MG tablet TAKE 1 TABLET TWICE A DAY 180 tablet 0  . metoprolol tartrate (LOPRESSOR) 25 MG tablet Take 0.5 tablets (12.5 mg total) by mouth 2 (two) times daily. 90 tablet 3  . Multiple Vitamin (MULTIVITAMIN WITH MINERALS) TABS tablet Take 1 tablet by mouth daily.    . Omega-3 Fatty Acids (FISH OIL) 1000 MG CPDR Take 1 capsule by mouth daily.    Marland Kitchen omeprazole (PRILOSEC) 20 MG capsule Take 20 mg by mouth daily.     No current facility-administered medications for this visit.    Allergies:  Patient has no known allergies.   Social History: The patient  reports that he has never smoked. He has never used smokeless tobacco. He reports that he does not drink alcohol or use drugs.   ROS:  Please see the history of present illness. Otherwise,  complete review of systems is positive for none.  All other systems are reviewed and negative.   Physical Exam: VS:  BP 125/69   Pulse (!) 59   Ht 6\' 2"  (1.88 m)   Wt 234 lb 12.8 oz (106.5 kg)   SpO2 97%   BMI 30.15 kg/m , BMI Body mass index is 30.15 kg/m.  Wt Readings from Last 3 Encounters:  11/09/16 234 lb 12.8 oz (106.5 kg)  07/10/16 231 lb (104.8 kg)  04/20/16 224 lb (101.6 kg)    Patient appears comfortable at rest.  HEENT: Conjunctiva and lids normal, oropharynx clear.   Neck: Supple, no elevated JVP or carotid bruits, no thyromegaly.  Lungs: Clear to auscultation, nonlabored breathing at rest.  Thorax: Well-healed sternal incision.  Cardiac: Regular rate and rhythm, no S3 or significant systolic murmur, no pericardial rub.  Abdomen: Soft, nontender, bowel sounds present.  Extremities: No pitting edema, distal pulses 2+.  Skin: Warm and dry. Musculoskeletal: No kyphosis. Neuropsychiatric: Alert and oriented 3, affect appropriate.  ECG: I personally reviewed the tracing from 10/21/2015 which showed sinus bradycardia with prolonged PR interval and nonspecific T-wave changes.  Recent Labwork: 07/02/2016: ALT 17; AST 18; BUN 18; Creat 1.30; Potassium 4.5; Sodium 140     Component Value Date/Time   CHOL 114 07/02/2016 0853   TRIG 106 07/02/2016 0853   HDL 24 (L) 07/02/2016 0853   CHOLHDL 4.8 07/02/2016 0853   VLDL 21 07/02/2016 0853   LDLCALC 69 07/02/2016 0853    Other Studies Reviewed Today:  Echocardiogram 04/20/2013: Study Conclusions  - Left ventricle: The cavity size was normal. There was focal basal hypertrophy. Systolic function was normal. The estimated ejection fraction was in the range of 50% to 55%. Wall motion was normal; there were no regional wall motion abnormalities. - Left atrium: The atrium was mildly dilated. - Right atrium: The atrium was mildly dilated.  Assessment and Plan:  1. Multivessel CAD status post CABG in 2014. He has occasional angina symptoms as well as some episodes of nausea and emesis with emotional upset. Plan is to continue medical therapy and follow-up with a Lexiscan Myoview to reassess ischemic burden.  2. Essential hypertension, systolic blood pressure in the 120s today.  3. Hyperlipidemia, on Mevacor. Last LDL 69.  4. OSA on CPAP.  Current medicines were reviewed with the patient today.   Orders Placed This Encounter  Procedures  . NM Myocar Multi W/Spect W/Wall Motion / EF  .  Myocardial Perfusion Imaging    Disposition: Follow-up in 6 months, sooner if needed.  Signed, [name] Sark, MD, [facility] 11/09/2016 4:02 PM     [facility] [place], [place], [place] [address] Phone: [phone]; Fax: [phone]

## 2016-11-09 ENCOUNTER — Encounter: Payer: Self-pay | Admitting: Cardiology

## 2016-11-09 ENCOUNTER — Ambulatory Visit (INDEPENDENT_AMBULATORY_CARE_PROVIDER_SITE_OTHER): Payer: BC Managed Care – PPO | Admitting: Cardiology

## 2016-11-09 ENCOUNTER — Telehealth: Payer: Self-pay | Admitting: Cardiology

## 2016-11-09 VITALS — BP 125/69 | HR 59 | Ht 74.0 in | Wt 234.8 lb

## 2016-11-09 DIAGNOSIS — Z9989 Dependence on other enabling machines and devices: Secondary | ICD-10-CM

## 2016-11-09 DIAGNOSIS — G4733 Obstructive sleep apnea (adult) (pediatric): Secondary | ICD-10-CM

## 2016-11-09 DIAGNOSIS — I25119 Atherosclerotic heart disease of native coronary artery with unspecified angina pectoris: Secondary | ICD-10-CM

## 2016-11-09 DIAGNOSIS — E782 Mixed hyperlipidemia: Secondary | ICD-10-CM

## 2016-11-09 DIAGNOSIS — I10 Essential (primary) hypertension: Secondary | ICD-10-CM | POA: Diagnosis not present

## 2016-11-09 NOTE — Patient Instructions (Signed)
Medication Instructions:  Your physician recommends that you continue on your current medications as directed. Please refer to the Current Medication list given to you today.  Labwork: NONE  Testing/Procedures: Your physician has requested that you have a lexiscan myoview. For further information please visit [email or web address]. Please follow instruction sheet, as given.  Follow-Up: Your physician wants you to follow-up in: 6 MONTHS WITH DR. MCDOWELL. You will receive a reminder letter in the mail two months in advance. If you don't receive a letter, please call our office to schedule the follow-up appointment.  Any Other Special Instructions Will Be Listed Below (If Applicable).  If you need a refill on your cardiac medications before your next appointment, please call your pharmacy. 

## 2016-11-09 NOTE — Telephone Encounter (Signed)
Scheduled Lexiscan at [facility] Nov 13, 2016  Arrive at 8:45 am

## 2016-11-13 ENCOUNTER — Telehealth: Payer: Self-pay | Admitting: *Deleted

## 2016-11-13 ENCOUNTER — Encounter (HOSPITAL_COMMUNITY)
Admission: RE | Admit: 2016-11-13 | Discharge: 2016-11-13 | Disposition: A | Discharge status: Home / Self Care | Payer: BC Managed Care – PPO | Source: Ambulatory Visit | Attending: Cardiology | Admitting: Cardiology

## 2016-11-13 ENCOUNTER — Encounter (HOSPITAL_BASED_OUTPATIENT_CLINIC_OR_DEPARTMENT_OTHER)
Admission: RE | Admit: 2016-11-13 | Discharge: 2016-11-13 | Disposition: A | Discharge status: Home / Self Care | Payer: BC Managed Care – PPO | Source: Ambulatory Visit | Attending: Cardiology | Admitting: Cardiology

## 2016-11-13 ENCOUNTER — Encounter (HOSPITAL_COMMUNITY): Payer: Self-pay

## 2016-11-13 ENCOUNTER — Telehealth: Payer: Self-pay | Admitting: Cardiology

## 2016-11-13 DIAGNOSIS — I25119 Atherosclerotic heart disease of native coronary artery with unspecified angina pectoris: Secondary | ICD-10-CM | POA: Insufficient documentation

## 2016-11-13 DIAGNOSIS — I2510 Atherosclerotic heart disease of native coronary artery without angina pectoris: Secondary | ICD-10-CM

## 2016-11-13 LAB — NM MYOCAR MULTI W/SPECT W/WALL MOTION / EF
CHL CUP NUCLEAR SSS: 8
LV dias vol: 151 mL (ref 62–150)
LV sys vol: 83 mL
NUC STRESS TID: 1.06
Peak HR: 64 {beats}/min
RATE: 0.39
Rest HR: 43 {beats}/min
SDS: 4
SRS: 4

## 2016-11-13 MED ORDER — SODIUM CHLORIDE 0.9% FLUSH
INTRAVENOUS | Status: AC
  Administered 2016-11-13: 10 mL via INTRAVENOUS
  Filled 2016-11-13: qty 10

## 2016-11-13 MED ORDER — TECHNETIUM TC 99M TETROFOSMIN IV KIT
10.0000 | PACK | Freq: Once | INTRAVENOUS | Status: AC | PRN
  Administered 2016-11-13: 9 via INTRAVENOUS

## 2016-11-13 MED ORDER — TECHNETIUM TC 99M TETROFOSMIN IV KIT
30.0000 | PACK | Freq: Once | INTRAVENOUS | Status: AC | PRN
  Administered 2016-11-13: 30 via INTRAVENOUS

## 2016-11-13 MED ORDER — REGADENOSON 0.4 MG/5ML IV SOLN
INTRAVENOUS | Status: AC
  Administered 2016-11-13: 0.4 mg via INTRAVENOUS
  Filled 2016-11-13: qty 5

## 2016-11-13 NOTE — Telephone Encounter (Signed)
Patient informed and verbalized understanding of plan. Copy sent to PCP 

## 2016-11-13 NOTE — Telephone Encounter (Signed)
-----   Message from [name], MD sent at 11/13/2016 11:38 AM EDT ----- Results reviewed. I reviewed the images as well as. Possible inferior scar from prior infarct, although soft tissue attenuation also looks to be contributing. LVEF calculated at 45%. Reassuring that there are no large ischemic territories. I would recommend a follow-up echocardiogram to better assess LVEF. Anticipate continuing medical therapy at this time. A copy of this test should be forwarded to [name], MD.

## 2016-11-13 NOTE — Telephone Encounter (Signed)
Pre-cert Verification for the following procedure   Echo scheduled for 11/17/16 at [facility]

## 2016-11-17 ENCOUNTER — Ambulatory Visit (HOSPITAL_COMMUNITY)
Admission: RE | Admit: 2016-11-17 | Discharge: 2016-11-17 | Disposition: A | Discharge status: Home / Self Care | Payer: BC Managed Care – PPO | Source: Ambulatory Visit | Attending: Cardiology | Admitting: Cardiology

## 2016-11-17 DIAGNOSIS — Z951 Presence of aortocoronary bypass graft: Secondary | ICD-10-CM | POA: Insufficient documentation

## 2016-11-17 DIAGNOSIS — E119 Type 2 diabetes mellitus without complications: Secondary | ICD-10-CM | POA: Insufficient documentation

## 2016-11-17 DIAGNOSIS — I2510 Atherosclerotic heart disease of native coronary artery without angina pectoris: Secondary | ICD-10-CM | POA: Diagnosis not present

## 2016-11-17 DIAGNOSIS — E785 Hyperlipidemia, unspecified: Secondary | ICD-10-CM | POA: Diagnosis not present

## 2016-11-17 DIAGNOSIS — K219 Gastro-esophageal reflux disease without esophagitis: Secondary | ICD-10-CM | POA: Insufficient documentation

## 2016-11-17 DIAGNOSIS — I10 Essential (primary) hypertension: Secondary | ICD-10-CM | POA: Diagnosis not present

## 2016-11-17 NOTE — Progress Notes (Signed)
*  PRELIMINARY RESULTS* Echocardiogram 2D Echocardiogram has been performed.  Leavy Cella 11/17/2016, 2:56 PM

## 2016-11-18 ENCOUNTER — Encounter: Payer: Self-pay | Admitting: *Deleted

## 2017-01-02 ENCOUNTER — Other Ambulatory Visit: Payer: Self-pay | Admitting: "Endocrinology

## 2017-01-02 LAB — COMPREHENSIVE METABOLIC PANEL
ALK PHOS: 85 U/L (ref 40–115)
ALT: 23 U/L (ref 9–46)
AST: 18 U/L (ref 10–35)
Albumin: 4 g/dL (ref 3.6–5.1)
BILIRUBIN TOTAL: 1.3 mg/dL — AB (ref 0.2–1.2)
BUN: 18 mg/dL (ref 7–25)
CO2: 22 mmol/L (ref 20–31)
CREATININE: 1.37 mg/dL — AB (ref 0.70–1.25)
Calcium: 9.5 mg/dL (ref 8.6–10.3)
Chloride: 106 mmol/L (ref 98–110)
Glucose, Bld: 153 mg/dL — ABNORMAL HIGH (ref 65–99)
POTASSIUM: 4.3 mmol/L (ref 3.5–5.3)
Sodium: 138 mmol/L (ref 135–146)
TOTAL PROTEIN: 6.5 g/dL (ref 6.1–8.1)

## 2017-01-03 ENCOUNTER — Other Ambulatory Visit: Payer: Self-pay | Admitting: "Endocrinology

## 2017-01-04 ENCOUNTER — Other Ambulatory Visit: Payer: Self-pay | Admitting: *Deleted

## 2017-01-04 MED ORDER — METOPROLOL TARTRATE 25 MG PO TABS: 12.5000 mg | ORAL_TABLET | Freq: Two times a day (BID) | ORAL | 3 refills | Status: DC

## 2017-01-04 MED ORDER — LOVASTATIN 20 MG PO TABS: ORAL_TABLET | ORAL | 3 refills | Status: DC

## 2017-01-05 LAB — HEMOGLOBIN A1C
HEMOGLOBIN A1C: 6.8 % — AB (ref <5.7)
Mean Plasma Glucose: 148 mg/dL

## 2017-01-11 ENCOUNTER — Ambulatory Visit: Payer: BC Managed Care – PPO | Admitting: "Endocrinology

## 2017-02-11 ENCOUNTER — Encounter: Payer: Self-pay | Admitting: "Endocrinology

## 2017-02-11 ENCOUNTER — Ambulatory Visit (INDEPENDENT_AMBULATORY_CARE_PROVIDER_SITE_OTHER): Payer: BC Managed Care – PPO | Admitting: "Endocrinology

## 2017-02-11 VITALS — BP 146/75 | HR 62 | Ht 74.0 in | Wt 226.0 lb

## 2017-02-11 DIAGNOSIS — I10 Essential (primary) hypertension: Secondary | ICD-10-CM | POA: Diagnosis not present

## 2017-02-11 DIAGNOSIS — E782 Mixed hyperlipidemia: Secondary | ICD-10-CM

## 2017-02-11 DIAGNOSIS — E1159 Type 2 diabetes mellitus with other circulatory complications: Secondary | ICD-10-CM

## 2017-02-11 NOTE — Progress Notes (Signed)
Subjective:    Patient ID: [name] [name], male    DOB: 1950-11-02, PCP Rory Percy, MD   Past Medical History:  Diagnosis Date  . Coronary atherosclerosis of native coronary artery    Multivessel status post CABG, LVEF 50-55% (LIMA-LAD, SVG-D1, SVG-D2, SVG-OM)  . Essential hypertension, benign   . GERD (gastroesophageal reflux disease)   . Gout   . NSTEMI (non-ST elevated myocardial infarction) [facility])    October 2014  . OSA on CPAP   . PUD (peptic ulcer disease)    Diagnosed in the 1990s  . Type 2 diabetes mellitus ([place])    Past Surgical History:  Procedure Laterality Date  . CORONARY ARTERY BYPASS GRAFT N/A 04/25/2013   Procedure: CORONARY ARTERY BYPASS GRAFTING (CABG);  Surgeon: [name], MD;  Location: [place];  Service: Open Heart Surgery;  Laterality: N/A;  Coronary artery bypass graft times four, on pump, using left internal mammary artery and right greater saphenous vein via endovein harvest.  . INTRAOPERATIVE TRANSESOPHAGEAL ECHOCARDIOGRAM N/A 04/25/2013   Procedure: INTRAOPERATIVE TRANSESOPHAGEAL ECHOCARDIOGRAM;  Surgeon: [name], MD;  Location: [place];  Service: Open Heart Surgery;  Laterality: N/A;  . LEFT HEART CATHETERIZATION WITH CORONARY ANGIOGRAM N/A 04/20/2013   Procedure: LEFT HEART CATHETERIZATION WITH CORONARY ANGIOGRAM;  Surgeon: [name], MD;  Location: [facility] CATH LAB;  Service: Cardiovascular;  Laterality: N/A;  . NASAL SEPTUM SURGERY  > 20 yr ago   Social History   Social History  . Marital status: Married    Spouse name: N/A  . Number of children: N/A  . Years of education: N/A   Occupational History  . Drives truck Mining engineer   Social History Main Topics  . Smoking status: Never Smoker  . Smokeless tobacco: Never Used  . Alcohol use No  . Drug use: No  . Sexual activity: Not Asked   Other Topics Concern  . None   Social History Narrative   Lives with wife, does not exercise but part of his job is strenuous.  Works in a body-shop part-time, too.   2 of his mother's sisters and 1 brother died suddenly (family was told MI but no autopsy).    Outpatient Encounter Prescriptions as of 02/11/2017  Medication Sig  . metFORMIN (GLUCOPHAGE) 500 MG tablet Take 500 mg by mouth 2 (two) times daily with a meal.  . aspirin EC 325 MG EC tablet Take 1 tablet (325 mg total) by mouth daily.  . colchicine 0.6 MG tablet Take 0.6 mg by mouth as needed.  Marland Kitchen escitalopram (LEXAPRO) 10 MG tablet Take 10 mg by mouth daily.  Marland Kitchen lovastatin (MEVACOR) 20 MG tablet TAKE ONE TABLET BY MOUTH AT BEDTIME, STOP TAKING ATORVASTATIN  . metoprolol tartrate (LOPRESSOR) 25 MG tablet Take 0.5 tablets (12.5 mg total) by mouth 2 (two) times daily.  . Multiple Vitamin (MULTIVITAMIN WITH MINERALS) TABS tablet Take 1 tablet by mouth daily.  . Omega-3 Fatty Acids (FISH OIL) 1000 MG CPDR Take 1 capsule by mouth daily.  Marland Kitchen omeprazole (PRILOSEC) 20 MG capsule Take 20 mg by mouth daily.  . [DISCONTINUED] metFORMIN (GLUCOPHAGE) 500 MG tablet TAKE 1 TABLET TWICE A DAY   No facility-administered encounter medications on file as of 02/11/2017.    ALLERGIES: No Known Allergies VACCINATION STATUS: There is no immunization history for the selected administration types on file for this patient.  Diabetes  He presents for his follow-up diabetic visit. He has type 2 diabetes mellitus. His disease  course has been stable. There are no hypoglycemic associated symptoms. Pertinent negatives for hypoglycemia include no confusion, headaches, pallor or seizures. There are no diabetic associated symptoms. Pertinent negatives for diabetes include no chest pain, no fatigue, no polydipsia, no polyphagia, no polyuria and no weakness. There are no hypoglycemic complications. Symptoms are worsening. Diabetic complications include heart disease. Risk factors for coronary artery disease include diabetes mellitus, dyslipidemia, hypertension, male sex and sedentary lifestyle. He is  compliant with treatment most of the time. His weight is decreasing steadily. He is following a generally unhealthy diet. When asked about meal planning, he reported none. He never participates in exercise.  Hyperlipidemia  This is a chronic problem. The current episode started more than 1 year ago. Pertinent negatives include no chest pain, myalgias or shortness of breath. Current antihyperlipidemic treatment includes statins. Risk factors for coronary artery disease include diabetes mellitus, dyslipidemia and hypertension.  Hypertension  This is a chronic problem. Pertinent negatives include no chest pain, headaches, neck pain, palpitations or shortness of breath. Risk factors for coronary artery disease include dyslipidemia, diabetes mellitus, male gender and sedentary lifestyle. Past treatments include beta blockers.    Review of Systems  Constitutional: Negative for chills, fatigue, fever and unexpected weight change.  HENT: Negative for dental problem, mouth sores and trouble swallowing.   Eyes: Negative for visual disturbance.  Respiratory: Negative for cough, choking, chest tightness, shortness of breath and wheezing.   Cardiovascular: Negative for chest pain, palpitations and leg swelling.  Gastrointestinal: Negative for abdominal distention, abdominal pain, constipation, diarrhea, nausea and vomiting.  Endocrine: Negative for polydipsia, polyphagia and polyuria.  Genitourinary: Negative for dysuria, flank pain, hematuria and urgency.  Musculoskeletal: Negative for back pain, gait problem, myalgias and neck pain.  Skin: Negative for pallor, rash and wound.  Neurological: Negative for seizures, syncope, weakness, numbness and headaches.  Psychiatric/Behavioral: Negative.  Negative for confusion and dysphoric mood.    Objective:    BP (!) 146/75   Pulse 62   Ht 6\' 2"  (1.88 m)   Wt 226 lb (102.5 kg)   BMI 29.02 kg/m   Wt Readings from Last 3 Encounters:  02/11/17 226 lb (102.5  kg)  11/09/16 234 lb 12.8 oz (106.5 kg)  07/10/16 231 lb (104.8 kg)    Physical Exam  Constitutional: He is oriented to person, place, and time. He appears well-developed and well-nourished. He is cooperative. No distress.  HENT:  Head: Normocephalic and atraumatic.  Eyes: EOM are normal.  Neck: Normal range of motion. Neck supple. No tracheal deviation present. No thyromegaly present.  Cardiovascular: Normal rate, S1 normal, S2 normal and normal heart sounds.  Exam reveals no gallop.   No murmur heard. Pulses:      Dorsalis pedis pulses are 1+ on the right side, and 1+ on the left side.       Posterior tibial pulses are 1+ on the right side, and 1+ on the left side.  Pulmonary/Chest: Breath sounds normal. No respiratory distress. He has no wheezes.  Abdominal: Soft. Bowel sounds are normal. He exhibits no distension. There is no tenderness. There is no guarding and no CVA tenderness.  Musculoskeletal: He exhibits no edema.       Right shoulder: He exhibits no swelling and no deformity.  Neurological: He is alert and oriented to person, place, and time. He has normal strength and normal reflexes. No cranial nerve deficit or sensory deficit. Gait normal.  Skin: Skin is warm and dry. No rash noted. No  cyanosis. Nails show no clubbing.  Psychiatric: He has a normal mood and affect. His speech is normal and behavior is normal. Judgment and thought content normal. Cognition and memory are normal.     CMP     Component Value Date/Time   NA 138 01/02/2017 1042   K 4.3 01/02/2017 1042   CL 106 01/02/2017 1042   CO2 22 01/02/2017 1042   GLUCOSE 153 (H) 01/02/2017 1042   BUN 18 01/02/2017 1042   CREATININE 1.37 (H) 01/02/2017 1042   CALCIUM 9.5 01/02/2017 1042   PROT 6.5 01/02/2017 1042   ALBUMIN 4.0 01/02/2017 1042   AST 18 01/02/2017 1042   ALT 23 01/02/2017 1042   ALKPHOS 85 01/02/2017 1042   BILITOT 1.3 (H) 01/02/2017 1042   GFRNONAA 58 (L) 07/02/2016 0853   GFRAA 67 07/02/2016  0853     Diabetic Labs (most recent): Lab Results  Component Value Date   HGBA1C 6.8 (H) 01/02/2017   HGBA1C 6.4 (H) 07/02/2016   HGBA1C 6.4 (H) 02/29/2016     Lipid Panel ( most recent) Lipid Panel     Component Value Date/Time   CHOL 114 07/02/2016 0853   TRIG 106 07/02/2016 0853   HDL 24 (L) 07/02/2016 0853   CHOLHDL 4.8 07/02/2016 0853   VLDL 21 07/02/2016 0853   LDLCALC 69 07/02/2016 0853     Assessment & Plan:   1. Type 2 diabetes mellitus with other circulatory complications (HCC)   His diabetes is  complicated by coronary artery disease status post coronary artery bypass graft and patient remains at a high risk for more acute and chronic complications of diabetes which include CAD, CVA, CKD, retinopathy, and neuropathy. These are all discussed in detail with the patient.  Patient came with A1c of 6.8%, increasing from 6.4%. - Recent labs reviewed.   - I have re-counseled the patient on diet management  by adopting a carbohydrate restricted / protein rich  Diet.  - Suggestion is made for patient to avoid simple carbohydrates   from his diet including Cakes , Desserts, Ice Cream,  Soda (  diet and regular) , Sweet Tea , Candies,  Chips, Cookies, Artificial Sweeteners,   and "Sugar-free" Products .  This will help patient to have stable blood glucose profile and potentially avoid unintended  Weight gain.  - Patient is advised to stick to a routine mealtimes to eat 3 meals  a day and avoid unnecessary snacks ( to snack only to correct hypoglycemia).  - I have approached patient with the following individualized plan to manage diabetes and patient agrees.  - I will  increase metformin to 500 mg by mouth   twice a day , therapeutically suitable for patient.  I have advised patient to keep adequate hydration and will repeat his renal function before his next visit.  - Patient will be considered for incretin therapy as appropriate next visit if he loses control. -  Patient specific target  for A1c; LDL, HDL, Triglycerides, and  Waist Circumference were discussed in detail.  2) BP/HTN: Controlled. Continue current medications including beta blockers. 3) Lipids/HPL:  continue statins. 4)  Weight/Diet:  He is gaining weight progressively, exercise, and carbohydrates information provided.  5) Chronic Care/Health Maintenance:  -Patient is on  Statin medications and encouraged to continue to follow up with Ophthalmology, Podiatrist at least yearly or according to recommendations, and advised to  stay away from smoking. I have recommended yearly flu vaccine and pneumonia vaccination at least every  5 years; moderate intensity exercise for up to 150 minutes weekly; and  sleep for at least 7 hours a day.  - 25 minutes of time was spent on the care of this patient , 50% of which was applied for counseling on diabetes complications and their preventions.  - I advised patient to maintain close follow up with Rory Percy, MD for primary care needs.  Follow up plan: -Return in about 3 months (around 05/14/2017) for follow up with pre-visit labs.  Glade Lloyd, MD Phone: [phone]  Fax: [phone]   02/11/2017, 10:32 AM

## 2017-02-11 NOTE — Patient Instructions (Signed)

## 2017-03-10 ENCOUNTER — Other Ambulatory Visit: Payer: Self-pay

## 2017-04-04 ENCOUNTER — Emergency Department (HOSPITAL_COMMUNITY)
Admission: EM | Admit: 2017-04-04 | Discharge: 2017-04-04 | Disposition: A | Discharge status: Home / Self Care | Payer: BC Managed Care – PPO | Attending: Emergency Medicine | Admitting: Emergency Medicine

## 2017-04-04 ENCOUNTER — Emergency Department (HOSPITAL_COMMUNITY): Payer: BC Managed Care – PPO

## 2017-04-04 ENCOUNTER — Encounter (HOSPITAL_COMMUNITY): Payer: Self-pay

## 2017-04-04 DIAGNOSIS — E119 Type 2 diabetes mellitus without complications: Secondary | ICD-10-CM | POA: Diagnosis not present

## 2017-04-04 DIAGNOSIS — R935 Abnormal findings on diagnostic imaging of other abdominal regions, including retroperitoneum: Secondary | ICD-10-CM | POA: Insufficient documentation

## 2017-04-04 DIAGNOSIS — R918 Other nonspecific abnormal finding of lung field: Secondary | ICD-10-CM | POA: Insufficient documentation

## 2017-04-04 DIAGNOSIS — R911 Solitary pulmonary nodule: Secondary | ICD-10-CM

## 2017-04-04 DIAGNOSIS — K388 Other specified diseases of appendix: Secondary | ICD-10-CM

## 2017-04-04 DIAGNOSIS — Z7984 Long term (current) use of oral hypoglycemic drugs: Secondary | ICD-10-CM | POA: Insufficient documentation

## 2017-04-04 DIAGNOSIS — Z7982 Long term (current) use of aspirin: Secondary | ICD-10-CM | POA: Diagnosis not present

## 2017-04-04 DIAGNOSIS — N289 Disorder of kidney and ureter, unspecified: Secondary | ICD-10-CM | POA: Diagnosis not present

## 2017-04-04 DIAGNOSIS — R109 Unspecified abdominal pain: Secondary | ICD-10-CM | POA: Diagnosis present

## 2017-04-04 DIAGNOSIS — Z79899 Other long term (current) drug therapy: Secondary | ICD-10-CM | POA: Insufficient documentation

## 2017-04-04 DIAGNOSIS — Z951 Presence of aortocoronary bypass graft: Secondary | ICD-10-CM | POA: Diagnosis not present

## 2017-04-04 DIAGNOSIS — N200 Calculus of kidney: Secondary | ICD-10-CM | POA: Diagnosis not present

## 2017-04-04 LAB — CBC WITH DIFFERENTIAL/PLATELET
BASOS ABS: 0 10*3/uL (ref 0.0–0.1)
BASOS PCT: 0 %
Eosinophils Absolute: 0.1 10*3/uL (ref 0.0–0.7)
Eosinophils Relative: 1 %
HCT: 38.8 % — ABNORMAL LOW (ref 39.0–52.0)
Hemoglobin: 13.2 g/dL (ref 13.0–17.0)
Lymphocytes Relative: 16 %
Lymphs Abs: 1.5 10*3/uL (ref 0.7–4.0)
MCH: 30.5 pg (ref 26.0–34.0)
MCHC: 34 g/dL (ref 30.0–36.0)
MCV: 89.6 fL (ref 78.0–100.0)
MONO ABS: 1 10*3/uL (ref 0.1–1.0)
Monocytes Relative: 11 %
Neutro Abs: 6.8 10*3/uL (ref 1.7–7.7)
Neutrophils Relative %: 72 %
Platelets: 204 10*3/uL (ref 150–400)
RBC: 4.33 MIL/uL (ref 4.22–5.81)
RDW: 14.3 % (ref 11.5–15.5)
WBC: 9.4 10*3/uL (ref 4.0–10.5)

## 2017-04-04 LAB — COMPREHENSIVE METABOLIC PANEL
ALBUMIN: 3.8 g/dL (ref 3.5–5.0)
ALK PHOS: 102 U/L (ref 38–126)
ALT: 19 U/L (ref 17–63)
AST: 19 U/L (ref 15–41)
Anion gap: 9 (ref 5–15)
BUN: 23 mg/dL — ABNORMAL HIGH (ref 6–20)
CALCIUM: 9.2 mg/dL (ref 8.9–10.3)
CHLORIDE: 101 mmol/L (ref 101–111)
CO2: 28 mmol/L (ref 22–32)
Creatinine, Ser: 2.07 mg/dL — ABNORMAL HIGH (ref 0.61–1.24)
GFR calc Af Amer: 37 mL/min — ABNORMAL LOW (ref >60)
GFR calc non Af Amer: 32 mL/min — ABNORMAL LOW (ref >60)
Glucose, Bld: 141 mg/dL — ABNORMAL HIGH (ref 65–99)
Potassium: 3.9 mmol/L (ref 3.5–5.1)
SODIUM: 138 mmol/L (ref 135–145)
Total Bilirubin: 2 mg/dL — ABNORMAL HIGH (ref 0.3–1.2)
Total Protein: 7.3 g/dL (ref 6.5–8.1)

## 2017-04-04 LAB — URINALYSIS, ROUTINE W REFLEX MICROSCOPIC
BILIRUBIN URINE: NEGATIVE
Glucose, UA: NEGATIVE mg/dL
Ketones, ur: 5 mg/dL — AB
Nitrite: NEGATIVE
PROTEIN: 30 mg/dL — AB
SPECIFIC GRAVITY, URINE: 1.02 (ref 1.005–1.030)
pH: 6 (ref 5.0–8.0)

## 2017-04-04 LAB — CBG MONITORING, ED: GLUCOSE-CAPILLARY: 158 mg/dL — AB (ref 65–99)

## 2017-04-04 MED ORDER — ONDANSETRON HCL 4 MG PO TABS: 4.0000 mg | ORAL_TABLET | Freq: Three times a day (TID) | ORAL | 0 refills | Status: DC | PRN

## 2017-04-04 MED ORDER — OXYCODONE-ACETAMINOPHEN 5-325 MG PO TABS: 1.0000 | ORAL_TABLET | ORAL | 0 refills | Status: DC | PRN

## 2017-04-04 MED ORDER — IOPAMIDOL (ISOVUE-300) INJECTION 61%
INTRAVENOUS | Status: AC
  Filled 2017-04-04: qty 30

## 2017-04-04 MED ORDER — SODIUM CHLORIDE 0.9 % IV BOLUS (SEPSIS)
1000.0000 mL | Freq: Once | INTRAVENOUS | Status: AC
  Administered 2017-04-04: 1000 mL via INTRAVENOUS

## 2017-04-04 MED ORDER — MORPHINE SULFATE (PF) 4 MG/ML IV SOLN
4.0000 mg | Freq: Once | INTRAVENOUS | Status: DC
  Filled 2017-04-04: qty 1

## 2017-04-04 MED ORDER — ONDANSETRON HCL 4 MG/2ML IJ SOLN
4.0000 mg | Freq: Once | INTRAMUSCULAR | Status: DC
  Filled 2017-04-04: qty 2

## 2017-04-04 NOTE — Discharge Instructions (Signed)
You have a 1 cm kidney stone on the left side. Additionally, we noted a mass around your appendix and nodules in your right lung. Follow-up with urology for your kidney stones and general surgery for the abdominal mass. These phone numbers are provided in the discharge instructions. Prescription for pain and nausea medicine as needed.

## 2017-04-04 NOTE — ED Notes (Signed)
Pt denies need for Zofran or Morphine at this time.

## 2017-04-04 NOTE — ED Triage Notes (Signed)
Reports of LLQ pain x 3-5 days. States he was seen at PCP, had xray and was told he had stool then air pockets below stool. Reports of watery stool but has not had regular BM since Monday. Had surgery on elbow 3 weeks ago.

## 2017-04-04 NOTE — ED Notes (Signed)
Pt informed of need for urine sample. Pt states he cannot provide one at this time but will as soon as able

## 2017-04-04 NOTE — ED Notes (Signed)
Pt in CT at this time.

## 2017-04-04 NOTE — ED Provider Notes (Signed)
[facility] DEPT Provider Note   CSN: [id number] Arrival date & time: 04/04/17  1050     History   Chief Complaint Chief Complaint  Patient presents with  . Abdominal Pain    HPI [name] [name] is a 66 y.o. male.  Left lower quadrant pain for 3-5 days. He was seen by his primary care doctor, but has not improved. He has not been eating much. He has watery diarrhea. No dysuria, hematuria, fever, sweats, check.  Nonsmoker.  Past medical history includes CAD, hypertension, MI, peptic ulcer disease, diabetes, hyperlipidemia, CABG.  No history of cancer.      Past Medical History:  Diagnosis Date  . Coronary atherosclerosis of native coronary artery    Multivessel status post CABG, LVEF 50-55% (LIMA-LAD, SVG-D1, SVG-D2, SVG-OM)  . Essential hypertension, benign   . GERD (gastroesophageal reflux disease)   . Gout   . NSTEMI (non-ST elevated myocardial infarction) [facility])    October 2014  . OSA on CPAP   . PUD (peptic ulcer disease)    Diagnosed in the 1990s  . Type 2 diabetes mellitus [facility])     Patient Active Problem List   Diagnosis Date Noted  . Mixed hyperlipidemia 05/24/2013  . Coronary atherosclerosis of native coronary artery 05/24/2013  . S/P CABG x 4 04/28/2013  . Gout   . DM type 2 causing vascular disease ([place])   . Essential hypertension, benign   . OSA on CPAP   . GERD (gastroesophageal reflux disease)     Past Surgical History:  Procedure Laterality Date  . CORONARY ARTERY BYPASS GRAFT N/A 04/25/2013   Procedure: CORONARY ARTERY BYPASS GRAFTING (CABG);  Surgeon: [name], MD;  Location: [place];  Service: Open Heart Surgery;  Laterality: N/A;  Coronary artery bypass graft times four, on pump, using left internal mammary artery and right greater saphenous vein via endovein harvest.  . INTRAOPERATIVE TRANSESOPHAGEAL ECHOCARDIOGRAM N/A 04/25/2013   Procedure: INTRAOPERATIVE TRANSESOPHAGEAL ECHOCARDIOGRAM;  Surgeon: [name], MD;  Location: [place];  Service: Open Heart Surgery;  Laterality: N/A;  . LEFT HEART CATHETERIZATION WITH CORONARY ANGIOGRAM N/A 04/20/2013   Procedure: LEFT HEART CATHETERIZATION WITH CORONARY ANGIOGRAM;  Surgeon: [name], MD;  Location: [facility] CATH LAB;  Service: Cardiovascular;  Laterality: N/A;  . NASAL SEPTUM SURGERY  > 20 yr ago       Home Medications    Prior to Admission medications   Medication Sig Start Date End Date Taking? Authorizing Provider  aspirin EC 325 MG EC tablet Take 1 tablet (325 mg total) by mouth daily. 04/29/13  Yes Lars Pinks M, PA-C  colchicine 0.6 MG tablet Take 0.6 mg by mouth as needed. 07/27/13  Yes [name], MD  escitalopram (LEXAPRO) 10 MG tablet Take 10 mg by mouth daily.   Yes [provider]  lisinopril (PRINIVIL,ZESTRIL) 10 MG tablet Take 1 tablet by mouth daily.   Yes [provider]  lovastatin (MEVACOR) 20 MG tablet TAKE ONE TABLET BY MOUTH AT BEDTIME, STOP TAKING ATORVASTATIN 01/04/17  Yes [name], MD  metFORMIN (GLUCOPHAGE) 500 MG tablet Take 500 mg by mouth 2 (two) times daily with a meal.   Yes [provider]  metoprolol tartrate (LOPRESSOR) 25 MG tablet Take 0.5 tablets (12.5 mg total) by mouth 2 (two) times daily. 01/04/17  Yes [name], MD  Multiple Vitamin (MULTIVITAMIN WITH MINERALS) TABS tablet Take 1 tablet by mouth daily.   Yes [provider]  Omega-3 Fatty Acids (FISH OIL) 1000 MG CPDR Take 1 capsule by mouth daily.   Yes [provider]  omeprazole (PRILOSEC) 20 MG capsule Take 20 mg by mouth daily.   Yes [provider]  polyethylene glycol powder (GLYCOLAX/MIRALAX) powder Take 17 g by mouth daily as needed for constipation. 04/02/17  Yes [provider]  pyridoxine (B-6) 200 MG tablet Take 200 mg by mouth daily.   Yes [provider]  vitamin C (ASCORBIC ACID) 500 MG tablet Take 1,000 mg by mouth daily.   Yes [provider]     Family History Family History  Problem Relation Age of Onset  . CAD Mother   . CAD Father     Social History Social History  Substance Use Topics  . Smoking status: Never Smoker  . Smokeless tobacco: Never Used  . Alcohol use No     Allergies   Patient has no known allergies.   Review of Systems Review of Systems  All other systems reviewed and are negative.    Physical Exam Updated Vital Signs BP (!) 123/59   Pulse 71   Temp 98.3 F (36.8 C) (Oral)   Resp 20   Ht 6\' 2"  (1.88 m)   Wt 97.5 kg (215 lb)   SpO2 99%   BMI 27.60 kg/m   Physical Exam  Constitutional: He is oriented to person, place, and time. He appears well-developed and well-nourished.  HENT:  Head: Normocephalic and atraumatic.  Eyes: Conjunctivae are normal.  Neck: Neck supple.  Cardiovascular: Normal rate and regular rhythm.   Pulmonary/Chest: Effort normal and breath sounds normal.  Abdominal:  Tender LLQ  Musculoskeletal: Normal range of motion.  Neurological: He is alert and oriented to person, place, and time.  Skin: Skin is warm and dry.  Psychiatric: He has a normal mood and affect. His behavior is normal.  Nursing note and vitals reviewed.    ED Treatments / Results  Labs (all labs ordered are listed, but only abnormal results are displayed) Labs Reviewed  CBC WITH DIFFERENTIAL/PLATELET - Abnormal; Notable for the following:       Result Value   HCT 38.8 (*)    All other components within normal limits  COMPREHENSIVE METABOLIC PANEL - Abnormal; Notable for the following:    Glucose, Bld 141 (*)    BUN 23 (*)    Creatinine, Ser 2.07 (*)    Total Bilirubin 2.0 (*)    GFR calc non Af Amer 32 (*)    GFR calc Af Amer 37 (*)    All other components within normal limits  URINALYSIS, ROUTINE W REFLEX MICROSCOPIC - Abnormal; Notable for the following:    APPearance HAZY (*)    Hgb urine dipstick MODERATE (*)    Ketones, ur 5 (*)    Protein, ur 30 (*)    Leukocytes, UA  SMALL (*)    Bacteria, UA RARE (*)    Squamous Epithelial / LPF 0-5 (*)    All other components within normal limits  CBG MONITORING, ED - Abnormal; Notable for the following:    Glucose-Capillary 158 (*)    All other components within normal limits    EKG  EKG Interpretation None       Radiology Ct Abdomen Pelvis Wo Contrast  Result Date: 04/04/2017 CLINICAL DATA:  Left lower quadrant pain 3-5 days. Elbow surgery 3 weeks ago. EXAM: CT ABDOMEN AND PELVIS WITHOUT CONTRAST TECHNIQUE: Multidetector CT imaging of the abdomen and pelvis was  performed following the standard protocol without IV contrast. COMPARISON:  None. FINDINGS: Lower chest: 1 cm nodule over the anterior aspect of the right lower lobe. Four- 5 mm ground-glass nodule over the anterior right lower lobe abutting the fissure. 4 mm nodule over the posterior right lower lobe. Sternotomy wires are present. Hepatobiliary: Within normal. Pancreas: Within normal. Spleen: Within normal. Adrenals/Urinary Tract: Adrenal glands are normal. Kidneys are normal in size. 2.6 cm simple cyst over the upper pole of the right kidney. No renal stones. Mild left-sided hydronephrosis with minimal stranding of the left perinephric fat. There is a 1 cm stone over the proximal left ureter causing this low-grade obstruction. Right ureter and bladder are normal. Stomach/Bowel: Stomach and small bowel are within normal. Appendix is not clearly identified, although there is a hypodense tubular structure over the right lower quadrant which does appear to arise from the cecal tip likely representing appendiceal origin. This measures 4.7 x 4.8 x 8.3 cm. This may represent a mucocele versus neoplasm of the appendix. There is some high-density material along the dependent portion of this tubular structure which may be due to enhancement, calcification or contrast. Much less likely this could represent Meckel's diverticulum or other mesenteric mass. Remainder the colon  is within normal. Vascular/Lymphatic: Within normal. Reproductive: Within normal. Other: Small bilateral inguinal hernias containing only peritoneal fat. Small umbilical hernia containing only peritoneal fat. Musculoskeletal: Degenerative change of the spine and hips. IMPRESSION: 1 cm stone over the left proximal to mid ureter causing low to moderate grade obstruction. 2.6 cm right renal cyst. Hypodense tubular structure over the right lower quadrant measuring 4.7 x 4.8 x 8.3 cm likely appendiceal in origin. This may represent a mucocele of the appendix versus appendiceal carcinoma. Less likely this could be due to Meckel's diverticulum or other mesenteric mass. Recommend surgical consultation. Several nodules over the right lower lobe with the largest measuring 1 cm. Recommend followup CT 3 months. This recommendation follows the consensus statement: Guidelines for Management of Small Pulmonary Nodules Detected on CT Scans: A Statement from the [place] as published in Radiology 2005; 237:395-400. Online at: https://www.arnold.com/. Small bilateral inguinal hernias containing only peritoneal fat. Small umbilical hernia containing only peritoneal fat. Electronically Signed   By: Marin Olp M.D.   On: 04/04/2017 15:17    Procedures Procedures (including critical care time)  Medications Ordered in ED Medications  ondansetron (ZOFRAN) injection 4 mg (4 mg Intravenous Not Given 04/04/17 1152)  morphine 4 MG/ML injection 4 mg (4 mg Intravenous Not Given 04/04/17 1152)  iopamidol (ISOVUE-300) 61 % injection (not administered)  sodium chloride 0.9 % bolus 1,000 mL (0 mLs Intravenous Stopped 04/04/17 1247)     Initial Impression / Assessment and Plan / ED Course  I have reviewed the triage vital signs and the nursing notes.  Pertinent labs & imaging results that were available during my care of the patient were reviewed by me and considered in my medical decision  making (see chart for details).    Patient presents with left lower quadrant pain. CT scan reveals a 1 cm proximal ureteral stone. Additionally, a periappendiceal mass and right lung nodules were noted.  Creatinine slightly elevated from baseline. Patient is in minimal pain. He will contact [facility] in the morning. Additionally, I discussed the case with the general surgeon on call Dr. Constance Haw. She will evaluate him Tuesday morning for the appendiceal mass and right lung nodule. Discharge medications Percocet and Zofran.   Final Clinical Impressions(s) /  ED Diagnoses   Final diagnoses:  Kidney stone on left side  Mass of appendix  Nodule of right lung    New Prescriptions New Prescriptions   No medications on file     Nat Christen, MD 04/04/17 1616

## 2017-04-06 ENCOUNTER — Encounter: Payer: Self-pay | Admitting: General Surgery

## 2017-04-06 ENCOUNTER — Ambulatory Visit (INDEPENDENT_AMBULATORY_CARE_PROVIDER_SITE_OTHER): Payer: BC Managed Care – PPO | Admitting: Urology

## 2017-04-06 ENCOUNTER — Other Ambulatory Visit: Payer: Self-pay | Admitting: "Endocrinology

## 2017-04-06 ENCOUNTER — Ambulatory Visit (INDEPENDENT_AMBULATORY_CARE_PROVIDER_SITE_OTHER): Payer: BC Managed Care – PPO | Admitting: General Surgery

## 2017-04-06 VITALS — BP 133/74 | HR 68 | Temp 98.0°F | Resp 18 | Ht 74.0 in | Wt 216.0 lb

## 2017-04-06 DIAGNOSIS — K388 Other specified diseases of appendix: Secondary | ICD-10-CM

## 2017-04-06 DIAGNOSIS — N201 Calculus of ureter: Secondary | ICD-10-CM | POA: Diagnosis not present

## 2017-04-06 MED ORDER — PEG 3350-KCL-NABCB-NACL-NASULF 236 G PO SOLR: 4000.0000 mL | Freq: Once | ORAL | 0 refills | Status: AC

## 2017-04-06 MED ORDER — NEOMYCIN SULFATE 500 MG PO TABS: 1000.0000 mg | ORAL_TABLET | Freq: Three times a day (TID) | ORAL | 0 refills | Status: DC

## 2017-04-06 MED ORDER — METRONIDAZOLE 500 MG PO TABS: 500.0000 mg | ORAL_TABLET | Freq: Three times a day (TID) | ORAL | 0 refills | Status: DC

## 2017-04-06 NOTE — H&P (Signed)
[facility] History and Physical   Reason for Referral: Incidental appendiceal mass  Referring Physician: Dr. [name] is an 66 y.o. male.  HPI: Mr. Tilly is a pleasant 66 yo who presented to the ED this weekend with complaints of left sided abdominal pain. He had thought he was having pain from constipation, and was seen by his PCP and given miralax.  He started to have liquid stools but the pain continued.  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  He reports having a colonoscopy a few years ago with Dr. Britta Mccreedy and that this was normal. He has had some chronic right sided abdominal pain but felt like this was related to his CABG surgery and muscle strain.   During this episode he had some nausea and vomiting, but is unsure if it was related to his pain.        Past Medical History:  Diagnosis Date  . Coronary atherosclerosis of native coronary artery    Multivessel status post CABG, LVEF 50-55% (LIMA-LAD, SVG-D1, SVG-D2, SVG-OM)  . Essential hypertension, benign   . GERD (gastroesophageal reflux disease)   . Gout   . NSTEMI (non-ST elevated myocardial infarction) [facility])    October 2014  . OSA on CPAP   . PUD (peptic ulcer disease)    Diagnosed in the 1990s  . Type 2 diabetes mellitus ([place])          Past Surgical History:  Procedure Laterality Date  . CORONARY ARTERY BYPASS GRAFT N/A 04/25/2013   Procedure: CORONARY ARTERY BYPASS GRAFTING (CABG);  Surgeon: [name], MD;  Location: [place];  Service: Open Heart Surgery;  Laterality: N/A;  Coronary artery bypass graft times four, on pump, using left internal mammary artery and right greater saphenous vein via endovein harvest.  . INTRAOPERATIVE TRANSESOPHAGEAL ECHOCARDIOGRAM N/A 04/25/2013   Procedure: INTRAOPERATIVE TRANSESOPHAGEAL  ECHOCARDIOGRAM;  Surgeon: [name], MD;  Location: [place];  Service: Open Heart Surgery;  Laterality: N/A;  . LEFT HEART CATHETERIZATION WITH CORONARY ANGIOGRAM N/A 04/20/2013   Procedure: LEFT HEART CATHETERIZATION WITH CORONARY ANGIOGRAM;  Surgeon: [name], MD;  Location: [facility] CATH LAB;  Service: Cardiovascular;  Laterality: N/A;  . NASAL SEPTUM SURGERY  > 20 yr ago    Family History  Problem Relation Age of Onset  . CAD Mother   . CAD Father   . Lung cancer Father   . Colon cancer Father     Social History:  reports that he has never smoked. He has never used smokeless tobacco. He reports that he does not drink alcohol or use drugs.  Allergies: No Known Allergies  Medications: I have reviewed the patient's current medications.  Personally reviewed the imaging studies- 8X5cm mass at the ileocecal region adjacent to appendix, some debride at base, small sub centimeter pulmonary nodule on right posterior lobe   Imaging Results (Last 48 hours)  Ct Abdomen Pelvis Wo Contrast  Result Date: 04/04/2017 CLINICAL DATA:  Left lower quadrant pain 3-5 days. Elbow surgery 3 weeks ago. EXAM: CT ABDOMEN AND PELVIS WITHOUT CONTRAST TECHNIQUE: Multidetector CT imaging of the abdomen and pelvis was performed following the standard protocol without IV contrast. COMPARISON:  None. FINDINGS: Lower chest: 1 cm nodule over the anterior aspect of the right lower lobe. Four- 5 mm ground-glass nodule over the anterior  right lower lobe abutting the fissure. 4 mm nodule over the posterior right lower lobe. Sternotomy wires are present. Hepatobiliary: Within normal. Pancreas: Within normal. Spleen: Within normal. Adrenals/Urinary Tract: Adrenal glands are normal. Kidneys are normal in size. 2.6 cm simple cyst over the upper pole of the right kidney. No renal stones. Mild left-sided hydronephrosis with minimal stranding of the left perinephric fat. There is a 1 cm stone over the proximal left  ureter causing this low-grade obstruction. Right ureter and bladder are normal. Stomach/Bowel: Stomach and small bowel are within normal. Appendix is not clearly identified, although there is a hypodense tubular structure over the right lower quadrant which does appear to arise from the cecal tip likely representing appendiceal origin. This measures 4.7 x 4.8 x 8.3 cm. This may represent a mucocele versus neoplasm of the appendix. There is some high-density material along the dependent portion of this tubular structure which may be due to enhancement, calcification or contrast. Much less likely this could represent Meckel's diverticulum or other mesenteric mass. Remainder the colon is within normal. Vascular/Lymphatic: Within normal. Reproductive: Within normal. Other: Small bilateral inguinal hernias containing only peritoneal fat. Small umbilical hernia containing only peritoneal fat. Musculoskeletal: Degenerative change of the spine and hips. IMPRESSION: 1 cm stone over the left proximal to mid ureter causing low to moderate grade obstruction. 2.6 cm right renal cyst. Hypodense tubular structure over the right lower quadrant measuring 4.7 x 4.8 x 8.3 cm likely appendiceal in origin. This may represent a mucocele of the appendix versus appendiceal carcinoma. Less likely this could be due to Meckel's diverticulum or other mesenteric mass. Recommend surgical consultation. Several nodules over the right lower lobe with the largest measuring 1 cm. Recommend followup CT 3 months. This recommendation follows the consensus statement: Guidelines for Management of Small Pulmonary Nodules Detected on CT Scans: A Statement from the [employer] as published in Radiology 2005; 237:395-400. Online at: [email or web address].[email or web address].[email or web address]/. Small bilateral inguinal hernias containing only peritoneal fat. Small umbilical hernia containing only peritoneal fat. Electronically Signed   By: Marin Olp M.D.    On: 04/04/2017 15:17     ROS:  A comprehensive review of systems was negative except for: Gastrointestinal: positive for abdominal pain, constipation, diarrhea, nausea and vomiting Genitourinary: positive for kidney stone Musculoskeletal: positive for stiff joints and gout  Blood pressure 133/74, pulse 68, temperature 98 F (36.7 C), resp. rate 18, height 6\' 2"  (1.88 m), weight 216 lb (98 kg). Physical Exam  Constitutional: He is oriented to person, place, and time and well-developed, well-nourished, and in no distress.  HENT:  Head: Normocephalic and atraumatic.  Eyes: Pupils are equal, round, and reactive to light.  Cardiovascular: Normal rate and regular rhythm.   Pulmonary/Chest: Effort normal and breath sounds normal.  Sternotomy scar well healed  Abdominal: Soft. Bowel sounds are normal. He exhibits no distension and no mass. There is no tenderness. There is no rebound and no guarding.  Musculoskeletal: Normal range of motion.  Right knee pain  Neurological: He is alert and oriented to person, place, and time.  Some limping on right leg  Skin: Skin is warm and dry.  Psychiatric: Mood, memory, affect and judgment normal.    Assessment/Plan: Mr. 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 I have personally reviewed  the imaging and guidelines for treatment and discussed with Dr. Arnoldo Morale.  At this time, the differential includes appendiceal mucocele which could be benign or malignant, appendiceal carcinoma, Meckel's diverticulum or duplication cyst.  We will proceed with reasonable susicipion that this is a mucocele with high risk of malignancy given its size.   -Preoperative testing ordered and tumor markers  -Scheduled for Colonoscopy with Dr. Arnoldo Morale 10/2 to assess remainder of colon  and see if synchronous lesion, preop sent to pharmacy -Scheduled for diagnostic laparoscopy to rule out carcinomatosis (CT non contrasted) and then open right hemicolectomy, preop oral antibiotics ordered -Clears on Monday and Tuesday only, NPO midnight  PLAN: I counseled the patient about the indication, risks and benefits of open right colectomy.  He understands there is a chance for bleeding, infection, injury to normal structures, leakage at the anastomosis, and need for secondary interventions, anesthesia reaction, cardiopulmonary issues and other risks not specifically detailed here. I described the expected recovery in the hospital for 4-7 days, the plan for follow-up and the restrictions during the recovery phase.  All questions were answered. Also explained that the full treatment will be dictated by the final pathology.  His and his families questions were answered to their satisfaction. The CT scan was showed to the patient and his family.    Virl Cagey 04/06/2017, 11:38 AM

## 2017-04-06 NOTE — H&P (Signed)
[facility] History and Physical   Reason for Referral: Incidental appendiceal mass  Referring Physician: Dr. [name] is an 66 y.o. male.  HPI: Mr. Kandel is a pleasant 66 yo who presented to the ED this weekend with complaints of left sided abdominal pain. He had thought he was having pain from constipation, and was seen by his PCP and given miralax.  He started to have liquid stools but the pain continued.  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  He reports having a colonoscopy a few years ago with Dr. Britta Mccreedy and that this was normal. He has had some chronic right sided abdominal pain but felt like this was related to his CABG surgery and muscle strain.   During this episode he had some nausea and vomiting, but is unsure if it was related to his pain.    Past Medical History:  Diagnosis Date  . Coronary atherosclerosis of native coronary artery    Multivessel status post CABG, LVEF 50-55% (LIMA-LAD, SVG-D1, SVG-D2, SVG-OM)  . Essential hypertension, benign   . GERD (gastroesophageal reflux disease)   . Gout   . NSTEMI (non-ST elevated myocardial infarction) [facility])    October 2014  . OSA on CPAP   . PUD (peptic ulcer disease)    Diagnosed in the 1990s  . Type 2 diabetes mellitus ([place])     Past Surgical History:  Procedure Laterality Date  . CORONARY ARTERY BYPASS GRAFT N/A 04/25/2013   Procedure: CORONARY ARTERY BYPASS GRAFTING (CABG);  Surgeon: [name], MD;  Location: [place];  Service: Open Heart Surgery;  Laterality: N/A;  Coronary artery bypass graft times four, on pump, using left internal mammary artery and right greater saphenous vein via endovein harvest.  . INTRAOPERATIVE TRANSESOPHAGEAL ECHOCARDIOGRAM N/A 04/25/2013   Procedure: INTRAOPERATIVE TRANSESOPHAGEAL ECHOCARDIOGRAM;  Surgeon: [name], MD;   Location: [place];  Service: Open Heart Surgery;  Laterality: N/A;  . LEFT HEART CATHETERIZATION WITH CORONARY ANGIOGRAM N/A 04/20/2013   Procedure: LEFT HEART CATHETERIZATION WITH CORONARY ANGIOGRAM;  Surgeon: [name], MD;  [facility];  Service: Cardiovascular;  Laterality: N/A;  . NASAL SEPTUM SURGERY  > 20 yr ago    Family History  Problem Relation Age of Onset  . CAD Mother   . CAD Father   . Lung cancer Father   . Colon cancer Father     Social History:  reports that he has never smoked. He has never used smokeless tobacco. He reports that he does not drink alcohol or use drugs.  Allergies: No Known Allergies  Medications: I have reviewed the patient's current medications.  Personally reviewed the imaging studies- 8X5cm mass at the ileocecal region adjacent to appendix, some debride at base, small sub centimeter pulmonary nodule on right posterior lobe  Ct Abdomen Pelvis Wo Contrast  Result Date: 04/04/2017 CLINICAL DATA:  Left lower quadrant pain 3-5 days. Elbow surgery 3 weeks ago. EXAM: CT ABDOMEN AND PELVIS WITHOUT CONTRAST TECHNIQUE: Multidetector CT imaging of the abdomen and pelvis was performed following the standard protocol without IV contrast. COMPARISON:  None. FINDINGS: Lower chest: 1 cm nodule over the anterior aspect of the right lower lobe. Four- 5 mm ground-glass nodule over the anterior right lower lobe abutting the fissure. 4 mm nodule over the posterior right lower lobe. Sternotomy  wires are present. Hepatobiliary: Within normal. Pancreas: Within normal. Spleen: Within normal. Adrenals/Urinary Tract: Adrenal glands are normal. Kidneys are normal in size. 2.6 cm simple cyst over the upper pole of the right kidney. No renal stones. Mild left-sided hydronephrosis with minimal stranding of the left perinephric fat. There is a 1 cm stone over the proximal left ureter causing this low-grade obstruction. Right ureter and bladder are normal. Stomach/Bowel:  Stomach and small bowel are within normal. Appendix is not clearly identified, although there is a hypodense tubular structure over the right lower quadrant which does appear to arise from the cecal tip likely representing appendiceal origin. This measures 4.7 x 4.8 x 8.3 cm. This may represent a mucocele versus neoplasm of the appendix. There is some high-density material along the dependent portion of this tubular structure which may be due to enhancement, calcification or contrast. Much less likely this could represent Meckel's diverticulum or other mesenteric mass. Remainder the colon is within normal. Vascular/Lymphatic: Within normal. Reproductive: Within normal. Other: Small bilateral inguinal hernias containing only peritoneal fat. Small umbilical hernia containing only peritoneal fat. Musculoskeletal: Degenerative change of the spine and hips. IMPRESSION: 1 cm stone over the left proximal to mid ureter causing low to moderate grade obstruction. 2.6 cm right renal cyst. Hypodense tubular structure over the right lower quadrant measuring 4.7 x 4.8 x 8.3 cm likely appendiceal in origin. This may represent a mucocele of the appendix versus appendiceal carcinoma. Less likely this could be due to Meckel's diverticulum or other mesenteric mass. Recommend surgical consultation. Several nodules over the right lower lobe with the largest measuring 1 cm. Recommend followup CT 3 months. This recommendation follows the consensus statement: Guidelines for Management of Small Pulmonary Nodules Detected on CT Scans: A Statement from the [employer] as published in Radiology 2005; 237:395-400. Online at: [email or web address].[email or web address]/. Small bilateral inguinal hernias containing only peritoneal fat. Small umbilical hernia containing only peritoneal fat. Electronically Signed   By: Marin Olp M.D.   On: 04/04/2017 15:17    ROS:  A comprehensive review of systems was negative except for:  Gastrointestinal: positive for abdominal pain, constipation, diarrhea, nausea and vomiting Genitourinary: positive for kidney stone Musculoskeletal: positive for stiff joints and gout  Blood pressure 133/74, pulse 68, temperature 98 F (36.7 C), resp. rate 18, height 6\' 2"  (1.88 m), weight 216 lb (98 kg). Physical Exam  Constitutional: He is oriented to person, place, and time and well-developed, well-nourished, and in no distress.  HENT:  Head: Normocephalic and atraumatic.  Eyes: Pupils are equal, round, and reactive to light.  Cardiovascular: Normal rate and regular rhythm.   Pulmonary/Chest: Effort normal and breath sounds normal.  Sternotomy scar well healed  Abdominal: Soft. Bowel sounds are normal. He exhibits no distension and no mass. There is no tenderness. There is no rebound and no guarding.  Musculoskeletal: Normal range of motion.  Right knee pain  Neurological: He is alert and oriented to person, place, and time.  Some limping on right leg  Skin: Skin is warm and dry.  Psychiatric: Mood, memory, affect and judgment normal.    Assessment/Plan: Mr. 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 I have personally reviewed the imaging and guidelines for treatment and discussed with Dr. Arnoldo Morale.  At this time, the differential  includes appendiceal mucocele which could be benign or malignant, appendiceal carcinoma, Meckel's diverticulum or duplication cyst.  We will proceed with reasonable susicipion that this is a mucocele with high risk of malignancy given its size.   -Preoperative testing ordered and tumor markers  -Scheduled for Colonoscopy with Dr. Arnoldo Morale 10/2 to assess remainder of colon and see if synchronous lesion, preop sent to pharmacy -Scheduled for diagnostic laparoscopy to rule  out carcinomatosis (CT non contrasted) and then open right hemicolectomy, preop oral antibiotics ordered -Clears on Monday and Tuesday only, NPO midnight  PLAN: I counseled the patient about the indication, risks and benefits of open right colectomy.  He understands there is a chance for bleeding, infection, injury to normal structures, leakage at the anastomosis, and need for secondary interventions, anesthesia reaction, cardiopulmonary issues and other risks not specifically detailed here. I described the expected recovery in the hospital for 4-7 days, the plan for follow-up and the restrictions during the recovery phase.  All questions were answered. Also explained that the full treatment will be dictated by the final pathology.  His and his families questions were answered to their satisfaction. The CT scan was showed to the patient and his family.    Virl Cagey 04/06/2017, 11:38 AM   Aviva Signs, MD

## 2017-04-06 NOTE — Progress Notes (Signed)
[facility] History and Physical   Reason for Referral: Incidental appendiceal mass  Referring Physician: Dr. Cristine [name] is an 66 y.o. male.  HPI: [name] [name] is a pleasant 66 yo who presented to the ED this weekend with complaints of left sided abdominal pain. He had thought he was having pain from constipation, and was seen by his PCP and given miralax.  He started to have liquid stools but the pain continued.  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  He reports having a colonoscopy a few years ago with Dr. Britta [name] and that this was normal. He has had some chronic right sided abdominal pain but felt like this was related to his CABG surgery and muscle strain.   During this episode he had some nausea and vomiting, but is unsure if it was related to his pain.    Past Medical History:  Diagnosis Date  . Coronary atherosclerosis of native coronary artery    Multivessel status post CABG, LVEF 50-55% (LIMA-LAD, SVG-D1, SVG-D2, SVG-OM)  . Essential hypertension, benign   . GERD (gastroesophageal reflux disease)   . Gout   . NSTEMI (non-ST elevated myocardial infarction) [facility])    October 2014  . OSA on CPAP   . PUD (peptic ulcer disease)    Diagnosed in the 1990s  . Type 2 diabetes mellitus ([place])     Past Surgical History:  Procedure Laterality Date  . CORONARY ARTERY BYPASS GRAFT N/A 04/25/2013   Procedure: CORONARY ARTERY BYPASS GRAFTING (CABG);  Surgeon: [name], MD;  Location: [place];  Service: Open Heart Surgery;  Laterality: N/A;  Coronary artery bypass graft times four, on pump, using left internal mammary artery and right greater saphenous vein via endovein harvest.  . INTRAOPERATIVE TRANSESOPHAGEAL ECHOCARDIOGRAM N/A 04/25/2013   Procedure: INTRAOPERATIVE TRANSESOPHAGEAL ECHOCARDIOGRAM;  Surgeon: [name], MD;   Location: [place];  Service: Open Heart Surgery;  Laterality: N/A;  . LEFT HEART CATHETERIZATION WITH CORONARY ANGIOGRAM N/A 04/20/2013   Procedure: LEFT HEART CATHETERIZATION WITH CORONARY ANGIOGRAM;  Surgeon: [name], MD;  Location: [facility] CATH LAB;  Service: Cardiovascular;  Laterality: N/A;  . NASAL SEPTUM SURGERY  > 20 yr ago    Family History  Problem Relation Age of Onset  . CAD Mother   . CAD Father   . Lung cancer Father   . Colon cancer Father     Social History:  reports that he has never smoked. He has never used smokeless tobacco. He reports that he does not drink alcohol or use drugs.  Allergies: No Known Allergies  Medications: I have reviewed the patient's current medications.  Personally reviewed the imaging studies- 8X5cm mass at the ileocecal region adjacent to appendix, some debride at base, small sub centimeter pulmonary nodule on right posterior lobe  Ct Abdomen Pelvis Wo Contrast  Result Date: 04/04/2017 CLINICAL DATA:  Left lower quadrant pain 3-5 days. Elbow surgery 3 weeks ago. EXAM: CT ABDOMEN AND PELVIS WITHOUT CONTRAST TECHNIQUE: Multidetector CT imaging of the abdomen and pelvis was performed following the standard protocol without IV contrast. COMPARISON:  None. FINDINGS: Lower chest: 1 cm nodule over the anterior aspect of the right lower lobe. Four- 5 mm ground-glass nodule over the anterior right lower lobe abutting the fissure. 4 mm nodule over the posterior right lower lobe. Sternotomy  wires are present. Hepatobiliary: Within normal. Pancreas: Within normal. Spleen: Within normal. Adrenals/Urinary Tract: Adrenal glands are normal. Kidneys are normal in size. 2.6 cm simple cyst over the upper pole of the right kidney. No renal stones. Mild left-sided hydronephrosis with minimal stranding of the left perinephric fat. There is a 1 cm stone over the proximal left ureter causing this low-grade obstruction. Right ureter and bladder are normal. Stomach/Bowel:  Stomach and small bowel are within normal. Appendix is not clearly identified, although there is a hypodense tubular structure over the right lower quadrant which does appear to arise from the cecal tip likely representing appendiceal origin. This measures 4.7 x 4.8 x 8.3 cm. This may represent a mucocele versus neoplasm of the appendix. There is some high-density material along the dependent portion of this tubular structure which may be due to enhancement, calcification or contrast. Much less likely this could represent Meckel's diverticulum or other mesenteric mass. Remainder the colon is within normal. Vascular/Lymphatic: Within normal. Reproductive: Within normal. Other: Small bilateral inguinal hernias containing only peritoneal fat. Small umbilical hernia containing only peritoneal fat. Musculoskeletal: Degenerative change of the spine and hips. IMPRESSION: 1 cm stone over the left proximal to mid ureter causing low to moderate grade obstruction. 2.6 cm right renal cyst. Hypodense tubular structure over the right lower quadrant measuring 4.7 x 4.8 x 8.3 cm likely appendiceal in origin. This may represent a mucocele of the appendix versus appendiceal carcinoma. Less likely this could be due to Meckel's diverticulum or other mesenteric mass. Recommend surgical consultation. Several nodules over the right lower lobe with the largest measuring 1 cm. Recommend followup CT 3 months. This recommendation follows the consensus statement: Guidelines for Management of Small Pulmonary Nodules Detected on CT Scans: A Statement from the [employer] as published in Radiology 2005; 237:395-400. Online at: https://www.arnold.com/. Small bilateral inguinal hernias containing only peritoneal fat. Small umbilical hernia containing only peritoneal fat. Electronically Signed   By: Marin Olp M.D.   On: 04/04/2017 15:17    ROS:  A comprehensive review of systems was negative except for:  Gastrointestinal: positive for abdominal pain, constipation, diarrhea, nausea and vomiting Genitourinary: positive for kidney stone Musculoskeletal: positive for stiff joints and gout  Blood pressure 133/74, pulse 68, temperature 98 F (36.7 C), resp. rate 18, height 6\' 2"  (1.88 m), weight 216 lb (98 kg). Physical Exam  Constitutional: He is oriented to person, place, and time and well-developed, well-nourished, and in no distress.  HENT:  Head: Normocephalic and atraumatic.  Eyes: Pupils are equal, round, and reactive to light.  Cardiovascular: Normal rate and regular rhythm.   Pulmonary/Chest: Effort normal and breath sounds normal.  Sternotomy scar well healed  Abdominal: Soft. Bowel sounds are normal. He exhibits no distension and no mass. There is no tenderness. There is no rebound and no guarding.  Musculoskeletal: Normal range of motion.  Right knee pain  Neurological: He is alert and oriented to person, place, and time.  Some limping on right leg  Skin: Skin is warm and dry.  Psychiatric: Mood, memory, affect and judgment normal.    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 I have personally reviewed the imaging and guidelines for treatment and discussed with Dr. Arnoldo Morale.  At this time, the differential  includes appendiceal mucocele which could be benign or malignant, appendiceal carcinoma, Meckel's diverticulum or duplication cyst.  We will proceed with reasonable susicipion that this is a mucocele with high risk of malignancy given its size.   -Preoperative testing ordered and tumor markers  -Scheduled for Colonoscopy with Dr. Arnoldo Morale 10/2 to assess remainder of colon and see if synchronous lesion, preop sent to pharmacy -Scheduled for diagnostic laparoscopy to rule  out carcinomatosis (CT non contrasted) and then open right hemicolectomy, preop oral antibiotics ordered -Clears on Monday and Tuesday only, NPO midnight  PLAN: I counseled the patient about the indication, risks and benefits of open right colectomy.  He understands there is a chance for bleeding, infection, injury to normal structures, leakage at the anastomosis, and need for secondary interventions, anesthesia reaction, cardiopulmonary issues and other risks not specifically detailed here. I described the expected recovery in the hospital for 4-7 days, the plan for follow-up and the restrictions during the recovery phase.  All questions were answered. Also explained that the full treatment will be dictated by the final pathology.  His and his families questions were answered to their satisfaction. The CT scan was showed to the patient and his family.    [name] [name] 04/06/2017, 11:38 AM

## 2017-04-06 NOTE — Patient Instructions (Addendum)
Appendiceal mucoceles    SUMMARY AND RECOMMENDATIONS ?Appendiceal mucoceles consist of a rare group of lesions characterized by a distended, mucus-filled appendix. Mucoceles may be due to non-neoplastic or neoplastic lesions. Based on their histology, they are classified into retention cysts, mucosal hyperplasia, cystadenomas, and cystadenocarcinomas. The course and prognosis of appendiceal mucoceles relate to their histologic subtypes. ?Patients are often asymptomatic or have nonspecific symptoms. An appendiceal mucocele may be discovered as an incidental finding on imaging or colonoscopic evaluation of unrelated complaints. The typical abdominal computed tomography (CT) finding in a patient with an appendiceal mucocele is a low-attenuation, well-encapsulated round or tubular cystic mass in the right lower quadrant adjacent to the cecum (image 1). The presence of curvilinear or punctate wall calcification at the expected site of the appendix is strongly suggestive of an appendiceal mucocele.  On colonoscopy, appendiceal mucoceles can produce a smooth indentation of the cecal lumen or have the appearance of a glossy, rounded, protruding mass arising from the appendiceal orifice moving in and out of the latter with respiration. As the overlying mucosa is normal, mucosal biopsies are not diagnostic. (See 'Clinical manifestations' above.) ?A definitive diagnosis of an appendiceal mucocele is made by pathological evaluation of the excised appendix (picture 2). However, a presumptive diagnosis can be made based on imaging (abdominal CT scan) findings and colonoscopic findings. When evaluating a patient with a presumptive appendiceal mucocele, we also perform an endoscopic ultrasound to rule out other submucosal lesions.  ?The differential diagnosis of an appendiceal mucocele includes appendicitis, appendiceal neoplasms (eg, leiomyoma, fibroma, neuroma, carcinoid, lipoma, adenocarcinoma of the appendix), and a  mesenteric or duplication cyst [0,3]. These can be differentiated from an appendiceal mucocele by their appearance on abdominal CT scan and potentially by endoscopic ultrasound.?We suggest that patients with an appendiceal mucocele undergo surgical resection, since lesions that appear to be benign on imaging studies may harbor changes of a cystadenocarcinoma (Grade 2C). (See 'Management' above.) ?Standard appendectomy is indicated in patients with retention cysts, mucosal hyperplasia, or cystadenoma. In the presence of a cystadenocarcinoma without mesenteric or adjacent organ involvement, resection of the appendiceal mesentery is also indicated. A right hemicolectomy is indicated in patients with a complicated mucocele with involvement of the terminal ileum or cecum and in patients with a cystadenocarcinoma with mesenteric, adjacent organ or peritoneal involvement. In patients with evidence of peritoneal disease at laparoscopy, an open laparotomy should be performed for debulking. (See 'Surgery' above.) ?Colorectal cancer and other tumors involving the gastrointestinal tract, endometrium, ovary, breast, and kidney have been reported in patients with appendiceal mucoceles. Careful examination of the ovaries should be performed at the time of surgery. Colorectal cancer should also be ruled out intraoperatively, if a colonoscopy was not performed preoperatively. (See 'Exclusion of a concurrent malignancy' above.) ?Survival is excellent (91 to 100 percent) after standard appendectomy of retention cysts, mucosal hyperplasia, or cystadenoma. In patients with appendiceal cystadenocarcinomas, five-year survival ranges from 6 to 100 percent based on stage. (See 'Prognosis' above.)   Open Colectomy An open colectomy is surgery to remove part or all of the large intestine (colon). This procedure may be used to treat several conditions, including:  Inflammation and infection of the colon (diverticulitis).  Tumors or  masses in the colon.  Inflammatory bowel disease, such as Crohn disease or ulcerative colitis.  Bleeding from the colon.  Blockage or obstruction of the colon.  Tell a health care provider about:  Any allergies you have.  All medicines you are taking, including vitamins, herbs, eye drops,  creams, and over-the-counter medicines.  Any problems you or family members have had with anesthetic medicines.  Any blood disorders you have.  Any surgeries you have had.  Any medical conditions you have.  Whether you are pregnant or may be pregnant.  Whether you smoke or use tobacco products. These can affect your body's reaction to anesthesia. What are the risks? Generally, this is a safe procedure. However, problems may occur, including:  Infection.  Bleeding.  Allergic reactions to medicines.  Damage to other structures or organs.  Pneumonia.  The incision opening up.  Tissues from inside the abdomen bulging through the incision (hernia).  Reopening of the colon where it was stitched or stapled together.  A blood clot forming in a vein and traveling to the lungs.  Future blockage of the small intestine from scar tissue.  What happens before the procedure? Staying hydrated Follow instructions from your health care provider about hydration, which may include:  Up to 2 hours before the procedure - you may continue to drink clear liquids, such as water, clear fruit juice, black coffee, and plain tea.  Eating and drinking restrictions Follow instructions from your health care provider about eating and drinking, which may include:  8 hours before the procedure - stop eating heavy meals or foods such as meat, fried foods, or fatty foods.  6 hours before the procedure - stop eating light meals or foods, such as toast or cereal.  6 hours before the procedure - stop drinking milk or drinks that contain milk.  2 hours before the procedure - stop drinking clear  liquids.  Bowel prep In some cases, you may be prescribed an oral bowel prep to clean out your colon. If so:  Take it as told by your health care provider. Starting the day before your procedure, you may need to drink a large amount of medicated liquid. The liquid will cause you to have multiple loose stools until your stool is almost clear or light green.  Follow instructions from your health care provider about eating and drinking restrictions during bowel prep.  Medicines  Ask your health care provider about: ? Changing or stopping your regular medicines or vitamins. This is especially important if you are taking diabetes medicines, blood thinners, or vitamin E. ? Taking medicines such as aspirin and ibuprofen. These medicines can thin your blood. Do not take these medicines before your procedure if your health care provider instructs you not to.  If you were prescribed an antibiotic medicine, take it as told by your health care provider. General instructions  Bring loose-fitting, comfortable clothing and slip-on shoes that you can put on without bending over.  Make sure to see your health care provider for any tests that you need before the procedure, such as: ? Blood tests. ? A test to check the heart's rhythm (electrocardiogram, ECG). ? A CT scan of your abdomen. ? Urine tests. ? Colonoscopy.  Plan to have someone take you home from the hospital or clinic.  Arrange for someone to help you with your activities during your recovery. What happens during the procedure?  To reduce your risk of infection: ? Your health care team will wash or sanitize their hands. ? Your skin will be washed with soap. ? Hair may be removed from the surgical area.  An IV tube will be inserted into one of your veins. The tube will be used to give you medicines and fluids.  You will be given a medicine to make  you fall asleep (general anesthetic). You may also be given a medicine to help you relax  (sedative).  Small monitors will be connected to your body. They will be used to check your heart, blood pressure, and oxygen level.  A breathing tube may be placed into your lungs during the procedure.  A thin, flexible tube (catheter) will be placed into your bladder to drain urine.  A tube may be inserted through your nose and into your stomach (nasogastric tube, or NG tube). The tube is used to remove stomach fluids after surgery until the intestines start working again.  An incision will be made in your abdomen.  Clamps or staples will be put on your colon.  The part of the colon between the clamps or staples will be removed.  The ends of the colon that remain will be stitched or stapled together.  The incision in your abdomen will be closed with stitches (sutures) or staples.  The incision will be covered with a bandage (dressing).  A small opening (stoma) may be created in your lower abdomen. A removable, external pouch (ostomy pouch) will be attached to the stoma. This pouch will collect stool outside of your body. Stool passes through the stoma and into the pouch instead of through your anus. The procedure may vary among health care providers and hospitals. What happens after the procedure?  Your blood pressure, heart rate, breathing rate, and blood oxygen level will be monitored until the medicines you were given have worn off.  You may continue to receive fluids and medicines through an IV tube.  You will start on a clear liquid diet and gradually go back to a normal diet.  Do not drive until your health care provider approves.  You may have some pain in your abdomen. You will be given pain medicine to control the pain.  You will be encouraged to do the following: ? Do breathing exercises to prevent pneumonia. ? Get up and start walking within a day after surgery. You should try to get up 5-6 times a day. This information is not intended to replace advice given to  you by your health care provider. Make sure you discuss any questions you have with your health care provider. Document Released: 04/26/2009 Document Revised: 03/30/2016 Document Reviewed: 03/30/2016 Elsevier Interactive Patient Education  [employer].

## 2017-04-07 ENCOUNTER — Other Ambulatory Visit: Payer: Self-pay | Admitting: Urology

## 2017-04-07 ENCOUNTER — Encounter (HOSPITAL_BASED_OUTPATIENT_CLINIC_OR_DEPARTMENT_OTHER): Payer: Self-pay | Admitting: *Deleted

## 2017-04-07 NOTE — Progress Notes (Signed)
SPOKE W/ PT AND WIFE.  NPO AFTER MN. ARRIVE AT 0930.  NEEDS EKG.  CURRENT LAB RESULTS IN CHART AND EPIC.  WILL TAKE METOPROLOL AND PRILOSEC AM DOS W/ SIPS OF WATER.

## 2017-04-08 ENCOUNTER — Other Ambulatory Visit: Payer: Self-pay | Admitting: General Surgery

## 2017-04-08 ENCOUNTER — Encounter (HOSPITAL_BASED_OUTPATIENT_CLINIC_OR_DEPARTMENT_OTHER): Payer: Self-pay | Admitting: *Deleted

## 2017-04-08 ENCOUNTER — Encounter (HOSPITAL_BASED_OUTPATIENT_CLINIC_OR_DEPARTMENT_OTHER)
Admission: RE | Disposition: A | Discharge status: Home / Self Care | Payer: Self-pay | Source: Ambulatory Visit | Attending: Urology

## 2017-04-08 ENCOUNTER — Other Ambulatory Visit: Payer: Self-pay

## 2017-04-08 ENCOUNTER — Ambulatory Visit (HOSPITAL_BASED_OUTPATIENT_CLINIC_OR_DEPARTMENT_OTHER)
Admission: RE | Admit: 2017-04-08 | Discharge: 2017-04-08 | Disposition: A | Discharge status: Home / Self Care | Payer: BC Managed Care – PPO | Source: Ambulatory Visit | Attending: Urology | Admitting: Urology

## 2017-04-08 ENCOUNTER — Ambulatory Visit (HOSPITAL_BASED_OUTPATIENT_CLINIC_OR_DEPARTMENT_OTHER): Payer: BC Managed Care – PPO | Admitting: Anesthesiology

## 2017-04-08 DIAGNOSIS — I10 Essential (primary) hypertension: Secondary | ICD-10-CM | POA: Insufficient documentation

## 2017-04-08 DIAGNOSIS — M109 Gout, unspecified: Secondary | ICD-10-CM | POA: Insufficient documentation

## 2017-04-08 DIAGNOSIS — Z8249 Family history of ischemic heart disease and other diseases of the circulatory system: Secondary | ICD-10-CM | POA: Insufficient documentation

## 2017-04-08 DIAGNOSIS — G4733 Obstructive sleep apnea (adult) (pediatric): Secondary | ICD-10-CM | POA: Diagnosis not present

## 2017-04-08 DIAGNOSIS — Z79899 Other long term (current) drug therapy: Secondary | ICD-10-CM | POA: Insufficient documentation

## 2017-04-08 DIAGNOSIS — Z9989 Dependence on other enabling machines and devices: Secondary | ICD-10-CM | POA: Insufficient documentation

## 2017-04-08 DIAGNOSIS — Z8711 Personal history of peptic ulcer disease: Secondary | ICD-10-CM | POA: Diagnosis not present

## 2017-04-08 DIAGNOSIS — I440 Atrioventricular block, first degree: Secondary | ICD-10-CM | POA: Insufficient documentation

## 2017-04-08 DIAGNOSIS — Z7984 Long term (current) use of oral hypoglycemic drugs: Secondary | ICD-10-CM | POA: Insufficient documentation

## 2017-04-08 DIAGNOSIS — Z9889 Other specified postprocedural states: Secondary | ICD-10-CM | POA: Diagnosis not present

## 2017-04-08 DIAGNOSIS — R918 Other nonspecific abnormal finding of lung field: Secondary | ICD-10-CM | POA: Diagnosis not present

## 2017-04-08 DIAGNOSIS — Z7982 Long term (current) use of aspirin: Secondary | ICD-10-CM | POA: Insufficient documentation

## 2017-04-08 DIAGNOSIS — Z951 Presence of aortocoronary bypass graft: Secondary | ICD-10-CM | POA: Diagnosis not present

## 2017-04-08 DIAGNOSIS — N132 Hydronephrosis with renal and ureteral calculous obstruction: Secondary | ICD-10-CM | POA: Diagnosis not present

## 2017-04-08 DIAGNOSIS — M1990 Unspecified osteoarthritis, unspecified site: Secondary | ICD-10-CM | POA: Insufficient documentation

## 2017-04-08 DIAGNOSIS — I252 Old myocardial infarction: Secondary | ICD-10-CM | POA: Diagnosis not present

## 2017-04-08 DIAGNOSIS — I2510 Atherosclerotic heart disease of native coronary artery without angina pectoris: Secondary | ICD-10-CM | POA: Diagnosis not present

## 2017-04-08 DIAGNOSIS — K219 Gastro-esophageal reflux disease without esophagitis: Secondary | ICD-10-CM | POA: Insufficient documentation

## 2017-04-08 DIAGNOSIS — E785 Hyperlipidemia, unspecified: Secondary | ICD-10-CM | POA: Diagnosis not present

## 2017-04-08 DIAGNOSIS — E119 Type 2 diabetes mellitus without complications: Secondary | ICD-10-CM | POA: Diagnosis not present

## 2017-04-08 HISTORY — DX: Old myocardial infarction: I25.2

## 2017-04-08 HISTORY — DX: Personal history of peptic ulcer disease: Z87.11

## 2017-04-08 HISTORY — DX: Atrioventricular block, first degree: I44.0

## 2017-04-08 HISTORY — DX: Disorder of kidney and ureter, unspecified: N28.9

## 2017-04-08 HISTORY — DX: Cyst of kidney, acquired: N28.1

## 2017-04-08 HISTORY — DX: Hyperlipidemia, unspecified: E78.5

## 2017-04-08 HISTORY — DX: Unspecified osteoarthritis, unspecified site: M19.90

## 2017-04-08 HISTORY — DX: Personal history of urinary calculi: Z87.442

## 2017-04-08 HISTORY — DX: Presence of aortocoronary bypass graft: Z95.1

## 2017-04-08 HISTORY — DX: Calculus of ureter: N20.1

## 2017-04-08 HISTORY — PX: CYSTOSCOPY WITH RETROGRADE PYELOGRAM, URETEROSCOPY AND STENT PLACEMENT: SHX5789

## 2017-04-08 HISTORY — DX: Presence of spectacles and contact lenses: Z97.3

## 2017-04-08 HISTORY — DX: Neoplasm of uncertain behavior of appendix: D37.3

## 2017-04-08 HISTORY — DX: Solitary pulmonary nodule: R91.1

## 2017-04-08 HISTORY — DX: Nocturia: R35.1

## 2017-04-08 LAB — POCT I-STAT 4, (NA,K, GLUC, HGB,HCT)
GLUCOSE: 97 mg/dL (ref 65–99)
HCT: 51 % (ref 39.0–52.0)
Hemoglobin: 17.3 g/dL — ABNORMAL HIGH (ref 13.0–17.0)
POTASSIUM: 4.5 mmol/L (ref 3.5–5.1)
Sodium: 140 mmol/L (ref 135–145)

## 2017-04-08 LAB — GLUCOSE, CAPILLARY: Glucose-Capillary: 121 mg/dL — ABNORMAL HIGH (ref 65–99)

## 2017-04-08 SURGERY — CYSTOURETEROSCOPY, WITH RETROGRADE PYELOGRAM AND STENT INSERTION
Anesthesia: General | Site: Renal | Laterality: Left

## 2017-04-08 MED ORDER — FENTANYL CITRATE (PF) 100 MCG/2ML IJ SOLN
INTRAMUSCULAR | Status: AC
  Filled 2017-04-08: qty 2

## 2017-04-08 MED ORDER — LIDOCAINE 2% (20 MG/ML) 5 ML SYRINGE
INTRAMUSCULAR | Status: DC | PRN
  Administered 2017-04-08: 60 mg via INTRAVENOUS

## 2017-04-08 MED ORDER — EPHEDRINE 5 MG/ML INJ
INTRAVENOUS | Status: AC
  Filled 2017-04-08: qty 10

## 2017-04-08 MED ORDER — DEXAMETHASONE SODIUM PHOSPHATE 10 MG/ML IJ SOLN
INTRAMUSCULAR | Status: AC
  Filled 2017-04-08: qty 1

## 2017-04-08 MED ORDER — MEPERIDINE HCL 25 MG/ML IJ SOLN
6.2500 mg | INTRAMUSCULAR | Status: DC | PRN
  Filled 2017-04-08: qty 1

## 2017-04-08 MED ORDER — PROPOFOL 10 MG/ML IV BOLUS
INTRAVENOUS | Status: DC | PRN
  Administered 2017-04-08: 150 mg via INTRAVENOUS

## 2017-04-08 MED ORDER — EPHEDRINE SULFATE-NACL 50-0.9 MG/10ML-% IV SOSY
PREFILLED_SYRINGE | INTRAVENOUS | Status: DC | PRN
  Administered 2017-04-08: 15 mg via INTRAVENOUS

## 2017-04-08 MED ORDER — FENTANYL CITRATE (PF) 100 MCG/2ML IJ SOLN
INTRAMUSCULAR | Status: DC | PRN
  Administered 2017-04-08 (×2): 50 ug via INTRAVENOUS

## 2017-04-08 MED ORDER — FENTANYL CITRATE (PF) 100 MCG/2ML IJ SOLN
25.0000 ug | INTRAMUSCULAR | Status: DC | PRN
  Filled 2017-04-08: qty 1

## 2017-04-08 MED ORDER — LACTATED RINGERS IV SOLN
INTRAVENOUS | Status: DC
Start: 2017-04-08 — End: 2017-04-08
  Administered 2017-04-08 (×2): via INTRAVENOUS
  Filled 2017-04-08: qty 1000

## 2017-04-08 MED ORDER — KETOROLAC TROMETHAMINE 30 MG/ML IJ SOLN
INTRAMUSCULAR | Status: DC | PRN
  Administered 2017-04-08: 15 mg via INTRAVENOUS

## 2017-04-08 MED ORDER — PEG 3350-KCL-NABCB-NACL-NASULF 236 G PO SOLR: 4000.0000 mL | Freq: Once | ORAL | 0 refills | Status: DC

## 2017-04-08 MED ORDER — MIDAZOLAM HCL 2 MG/2ML IJ SOLN
INTRAMUSCULAR | Status: AC
  Filled 2017-04-08: qty 2

## 2017-04-08 MED ORDER — PROPOFOL 10 MG/ML IV BOLUS
INTRAVENOUS | Status: AC
  Filled 2017-04-08: qty 20

## 2017-04-08 MED ORDER — DEXAMETHASONE SODIUM PHOSPHATE 4 MG/ML IJ SOLN
INTRAMUSCULAR | Status: DC | PRN
  Administered 2017-04-08: 10 mg via INTRAVENOUS

## 2017-04-08 MED ORDER — LACTATED RINGERS IV SOLN
INTRAVENOUS | Status: DC
  Filled 2017-04-08: qty 1000

## 2017-04-08 MED ORDER — CEFAZOLIN SODIUM-DEXTROSE 2-4 GM/100ML-% IV SOLN
2.0000 g | INTRAVENOUS | Status: AC
  Administered 2017-04-08: 2 g via INTRAVENOUS
  Filled 2017-04-08: qty 100

## 2017-04-08 MED ORDER — KETOROLAC TROMETHAMINE 30 MG/ML IJ SOLN
INTRAMUSCULAR | Status: AC
  Filled 2017-04-08: qty 1

## 2017-04-08 MED ORDER — ONDANSETRON HCL 4 MG/2ML IJ SOLN
INTRAMUSCULAR | Status: AC
  Filled 2017-04-08: qty 2

## 2017-04-08 MED ORDER — MIDAZOLAM HCL 5 MG/5ML IJ SOLN
INTRAMUSCULAR | Status: DC | PRN
  Administered 2017-04-08: 2 mg via INTRAVENOUS

## 2017-04-08 MED ORDER — METOCLOPRAMIDE HCL 5 MG/ML IJ SOLN
10.0000 mg | Freq: Once | INTRAMUSCULAR | Status: DC | PRN
  Filled 2017-04-08: qty 2

## 2017-04-08 MED ORDER — IOHEXOL 300 MG/ML  SOLN
INTRAMUSCULAR | Status: DC | PRN
  Administered 2017-04-08: 5 mL

## 2017-04-08 MED ORDER — ONDANSETRON HCL 4 MG/2ML IJ SOLN
INTRAMUSCULAR | Status: DC | PRN
  Administered 2017-04-08: 4 mg via INTRAVENOUS

## 2017-04-08 MED ORDER — LIDOCAINE 2% (20 MG/ML) 5 ML SYRINGE
INTRAMUSCULAR | Status: AC
  Filled 2017-04-08: qty 5

## 2017-04-08 MED ORDER — SODIUM CHLORIDE 0.9 % IR SOLN
Status: DC | PRN
  Administered 2017-04-08: 4000 mL

## 2017-04-08 MED ORDER — CEFAZOLIN SODIUM-DEXTROSE 2-4 GM/100ML-% IV SOLN
INTRAVENOUS | Status: AC
  Filled 2017-04-08: qty 100

## 2017-04-08 SURGICAL SUPPLY — 23 items
BAG DRAIN URO-CYSTO SKYTR STRL (DRAIN) ×4 IMPLANT
BAG DRN UROCATH (DRAIN) ×2
CATH INTERMIT  6FR 70CM (CATHETERS) ×4 IMPLANT
CLOTH BEACON ORANGE TIMEOUT ST (SAFETY) ×4 IMPLANT
FIBER LASER FLEXIVA 365 (UROLOGICAL SUPPLIES) IMPLANT
FIBER LASER TRAC TIP (UROLOGICAL SUPPLIES) IMPLANT
GLOVE BIOGEL M 6.5 STRL (GLOVE) ×4 IMPLANT
GLOVE BIOGEL PI IND STRL 6.5 (GLOVE) ×4 IMPLANT
GLOVE BIOGEL PI INDICATOR 6.5 (GLOVE) ×4
GOWN STRL REUS W/TWL XL LVL3 (GOWN DISPOSABLE) ×8 IMPLANT
GUIDEWIRE STR DUAL SENSOR (WIRE) ×8 IMPLANT
INFUSOR MANOMETER BAG 3000ML (MISCELLANEOUS) ×4 IMPLANT
IV NS 1000ML (IV SOLUTION)
IV NS 1000ML BAXH (IV SOLUTION) IMPLANT
IV NS IRRIG 3000ML ARTHROMATIC (IV SOLUTION) ×4 IMPLANT
KIT RM TURNOVER CYSTO AR (KITS) ×4 IMPLANT
MANIFOLD NEPTUNE II (INSTRUMENTS) ×4 IMPLANT
NS IRRIG 500ML POUR BTL (IV SOLUTION) ×4 IMPLANT
PACK CYSTO (CUSTOM PROCEDURE TRAY) ×4 IMPLANT
SHEATH ACCESS URETERAL 38CM (SHEATH) ×4 IMPLANT
STENT URET 6FRX26 CONTOUR (STENTS) ×4 IMPLANT
TUBE CONNECTING 12'X1/4 (SUCTIONS)
TUBE CONNECTING 12X1/4 (SUCTIONS) IMPLANT

## 2017-04-08 NOTE — Transfer of Care (Signed)
Last Vitals:  Vitals:   04/08/17 0930  BP: 136/66  Pulse: (!) 50  Resp: 16  Temp: 36.8 C  SpO2: 100%    Last Pain:  Vitals:   04/08/17 0958  TempSrc:   PainSc: 3       Patients Stated Pain Goal: 10 (04/08/17 9518)  Immediate Anesthesia Transfer of Care Note  Patient: [name] [name]  Procedure(s) Performed: Procedure(s) (LRB): CYSTOSCOPY WITH RETROGRADE PYELOGRAM, URETEROSCOPY,STENT PLACEMENT (Left)  Patient Location: PACU  Anesthesia Type: General  Level of Consciousness: awake, alert  and oriented  Airway & Oxygen Therapy: Patient Spontanous Breathing and Patient connected to nasal cannula oxygen  Post-op Assessment: Report given to PACU RN and Post -op Vital signs reviewed and stable  Post vital signs: Reviewed and stable  Complications: No apparent anesthesia complications

## 2017-04-08 NOTE — Interval H&P Note (Signed)
History and Physical Interval Note:  04/08/2017 12:07 PM  [name] [name]  has presented today for surgery, with the diagnosis of LEFT URETERAL STONE  The various methods of treatment have been discussed with the patient and family. After consideration of risks, benefits and other options for treatment, the patient has consented to  Procedure(s) with comments: CYSTOSCOPY WITH RETROGRADE PYELOGRAM, URETEROSCOPY,EXTRACTION OF STONE AND POSSIBLE STENT PLACEMENT (Left) - 90 MINS NEEDS DIGITAL URETEROSCOPE [phone] WIFE [id number] HOLMIUM LASER APPLICATION (Left) as a surgical intervention .  The patient's history has been reviewed, patient examined, no change in status, stable for surgery.  I have reviewed the patient's chart and labs.  Questions were answered to the patient's satisfaction.     Marton Redwood, III

## 2017-04-08 NOTE — Progress Notes (Signed)
See discharge instruction sheet with signature: 1430 time of instructions/discharge teaching

## 2017-04-08 NOTE — Anesthesia Procedure Notes (Signed)
Procedure Name: LMA Insertion Date/Time: 04/08/2017 12:16 PM Performed by: Montez Hageman Pre-anesthesia Checklist: Patient identified, Emergency Drugs available, Suction available and Patient being monitored Patient Re-evaluated:Patient Re-evaluated prior to induction Oxygen Delivery Method: Circle system utilized Preoxygenation: Pre-oxygenation with 100% oxygen Induction Type: IV induction Ventilation: Mask ventilation without difficulty LMA: LMA inserted LMA Size: 5.0 Number of attempts: 1 Airway Equipment and Method: Bite block Placement Confirmation: positive ETCO2 Tube secured with: Tape Dental Injury: Teeth and Oropharynx as per pre-operative assessment

## 2017-04-08 NOTE — Discharge Instructions (Addendum)
[facility] [phone] Post Ureteroscopy With or Without Stent Instructions  Definitions:  Ureter: The duct that transports urine from the kidney to the bladder. Stent:   A plastic hollow tube that is placed into the ureter, from the kidney to the                 bladder to prevent the ureter from swelling shut.  GENERAL INSTRUCTIONS:  We were not able to get to the stone. He will need a second operation to remove the stone after a period of rest.  Despite the fact that no skin incisions were used, the area around the ureter and bladder is raw and irritated. The stent is a foreign body which will further irritate the bladder wall. This irritation is manifested by increased frequency of urination, both day and night, and by an increase in the urge to urinate. In some, the urge to urinate is present almost always. Sometimes the urge is strong enough that you may not be able to stop yourself from urinating. The only real cure is to remove the stent and then give time for the bladder wall to heal which can't be done until the danger of the ureter swelling shut has passed, which varies.  You may see some blood in your urine while the stent is in place and a few days afterwards. Do not be alarmed, even if the urine was clear for a while. Get off your feet and drink lots of fluids until clearing occurs. If you start to pass clots or don't improve, call us.  DIET: You may return to your normal diet immediately. Because of the raw surface of your bladder, alcohol, spicy foods, acid type foods and drinks with caffeine may cause irritation or frequency and should be used in moderation. To keep your urine flowing freely and to avoid constipation, drink plenty of fluids during the day ( 8-10 glasses ). Tip: Avoid cranberry juice because it is very acidic.  ACTIVITY: Your physical activity doesn't need to be restricted. However, if you are very active, you may see some blood in your urine.  We suggest that you reduce your activity under these circumstances until the bleeding has stopped.  BOWELS: It is important to keep your bowels regular during the postoperative period. Straining with bowel movements can cause bleeding. A bowel movement every other day is reasonable. Use a mild laxative if needed, such as Milk of Magnesia 2-3 tablespoons, or 2 Dulcolax tablets. Call if you continue to have problems. If you have been taking narcotics for pain, before, during or after your surgery, you may be constipated. Take a laxative if necessary.   MEDICATION: You should resume your pre-surgery medications unless told not to. You may take oxybutynin or flomax if prescribed for bladder spasms or discomfort from the stent Take pain medication as directed for pain refractory to conservative management  PROBLEMS YOU SHOULD REPORT TO [place]:  Fevers over 100.5 Fahrenheit.  Heavy bleeding, or clots ( See above notes about blood in urine ).  Inability to urinate.  Drug reactions ( hives, rash, nausea, vomiting, diarrhea ).  Severe burning or pain with urination that is not improving.  Do not take any nonsteroidal anti inflammatories (Ibuprofen, Advil, Motrin, Aleve) until after 6:45pm today.    Post Anesthesia Home Care Instructions  Activity: Get plenty of rest for the remainder of the day. A responsible individual must stay with you for 24 hours following the procedure.  For the next 24  hours, DO NOT: -Drive a car -Paediatric nurse -Drink alcoholic beverages -Take any medication unless instructed by your physician -Make any legal decisions or sign important papers.  Meals: Start with liquid foods such as gelatin or soup. Progress to regular foods as tolerated. Avoid greasy, spicy, heavy foods. If nausea and/or vomiting occur, drink only clear liquids until the nausea and/or vomiting subsides. Call your physician if vomiting continues.  Special Instructions/Symptoms: Your throat  may feel dry or sore from the anesthesia or the breathing tube placed in your throat during surgery. If this causes discomfort, gargle with warm salt water. The discomfort should disappear within 24 hours.  If you had a scopolamine patch placed behind your ear for the management of post- operative nausea and/or vomiting:  1. The medication in the patch is effective for 72 hours, after which it should be removed.  Wrap patch in a tissue and discard in the trash. Wash hands thoroughly with soap and water. 2. You may remove the patch earlier than 72 hours if you experience unpleasant side effects which may include dry mouth, dizziness or visual disturbances. 3. Avoid touching the patch. Wash your hands with soap and water after contact with the patch.

## 2017-04-08 NOTE — Op Note (Signed)
Operative Note  Preoperative diagnosis:  1. Left ureteral calculus  Postoperative diagnosis: 1. Left ureteral calculus  Procedure(s): 1. Cystoscopy with left retrograde pyelogram left diagnostic ureteroscopy with left ureteral stent placement  Surgeon: Link Snuffer, MD  Assistants:none  Anesthesia: gen.  Complications: none immediate  EBL: minimal  Specimens: 1. none  Drains/Catheters: 1. 6 x 26 double-J ureteral stent  Intraoperative findings: 1 Cm radiopaque left midureteral calculus. He ureter was tight and a sheath was unable to be passed.  Indication: this is a 66 year old male with a 1 cm left ureteral calculus. Surgery scheduled for next week for excision of a appendiceal mass. Decision was made to go ahead and proceed with attempted removal.  Description of procedure:  The patient was identified. Consent was obtained. He was taken back to the operating room and placed in the supine position. Gen. Anesthesia was induced. He was converted to dorsal lithotomy and prepped and draped in a standard sterile fashion.Timeout was performed. A 21 French rigid cystoscope was advanced into the urethra and into the bladder and complete cystoscopy was performed with no abnormal findings. The left ureter was cannulated with a sensor wire and advanced up to the kidney under fluoroscopic guidance. A open-ended ureteral catheter was passed into the distal ureter over the wire and the wire withdrawn. We shot a retrograde pyelogram which revealed a filling defect at the level of the calculus was some mild upstream hydronephrosis. We replaced the sensor wire and advanced a semirigid ureteroscope alongside the wire. The ureter was tight so I passed a second sensor wire through the scope up to the kidney under fluoroscopic guidance and was able to pass the semirigid ureteroscope over the wire up to the level of the calculus. At this point the semirigid ureteroscopewas in as far as possible and  therefore I needed a access sheath and flexible ureteroscope. I withdrew the semirigid ureteroscope after advancing a second wire up to the kidney. I attempted to place a 12 x 14 ureteral access sheath over the wire under continuous fluoroscopic guidance but this was unable to pass up the tight ureter. The second wire was withdrawn and the other wire was backloaded onto a cystoscope which was advanced into the bladder and a 6 x 26 double-J ureteral stent was placed followed by removal of the wire. Fluoroscopy confirmed proximal placement and direct visualization confirmed a distal coil in the bladder. The bladder was drained and the scope withdrawn. Patient tolerated procedure well and was stable postoperatively.  Plan:the patient will need to be scheduled for cystoscopy with left retrograde pyelogram, left ureteroscopy with laser lithotripsy, and ureteral stent exchange. This will be done after he recovers from his appendiceal surgery. He will keep the stent for the time being.

## 2017-04-08 NOTE — H&P (Signed)
H&P Chief Complaint: left ureteral calculus  History of Present Illness: 66 YO M with 1cm proximal left ureteral calculus here for URS/LL.  Has excision of appendiceal mass scheduled next weel.  Past Medical History:  Diagnosis Date  . Appendiceal tumor   . Arthritis   . Coronary artery disease cardiologist-  dr Domenic Polite   hx NSTEMI 04-19-2013--- Multivessel CAD /  04-25-2013 s/p post CABG x4,  (LIMA-LAD, SVG-D1, SVG-D2, SVG-OM)  . Essential hypertension, benign   . First degree heart block   . GERD (gastroesophageal reflux disease)   . Gout    as of 04-07-2017-- per pt/ wife stable  . History of kidney stones   . History of non-ST elevation myocardial infarction (NSTEMI) 04/19/2013   s/p  cabg  . History of peptic ulcer disease dx 1990s  . Hyperlipidemia   . Left ureteral stone   . Nocturia   . Nodule of right lung    per CT 04-04-2017  right lower lobe multiple noodules  . OSA on CPAP   . Renal cyst, right    per CT 04-04-2017  . Renal insufficiency   . S/P CABG x 4 04/25/2013   LIMA to LAD;  SVG to OD;  SVG to D2;  SVG to OM  . Type 2 diabetes mellitus ([place])    last A1c 6.8 on 01-02-2017  . Wears glasses    Past Surgical History:  Procedure Laterality Date  . CARDIOVASCULAR STRESS TEST  11-13-2016   dr Domenic Polite   Intermediate risk nuclear study w/ medium defect of moderate severity in the basal inferoseptal, basal inferior, mid inferoseptal and mid inferior location (findings consistant w/ prior myocardial infarction)/  mild enlarged LV cavity size;  nuclear stress EF 45%  . CORONARY ARTERY BYPASS GRAFT N/A 04/25/2013   Procedure: CORONARY ARTERY BYPASS GRAFTING (CABG);  Surgeon: [name], MD;  Location: [place];  Service: Open Heart Surgery;  Laterality: N/A;  Coronary artery bypass graft times four, on pump, using left internal mammary artery and right greater saphenous vein via endovein harvest.  . ELBOW BURSA SURGERY Right 03-10-2017     at [facility]   and removal of  spur  . INTRAOPERATIVE TRANSESOPHAGEAL ECHOCARDIOGRAM N/A 04/25/2013   Procedure: INTRAOPERATIVE TRANSESOPHAGEAL ECHOCARDIOGRAM;  Surgeon: [name], MD;  Location: [facility] OR;  Service: Open Heart Surgery;  Laterality: N/A;  . KNEE ARTHROSCOPY Left 10/ 2017  approx.    dr Theda Sers  . LEFT HEART CATHETERIZATION WITH CORONARY ANGIOGRAM N/A 04/20/2013   Procedure: LEFT HEART CATHETERIZATION WITH CORONARY ANGIOGRAM;  Surgeon: [name], MD;  Location[facility] C[facility] LAB;  Service: Cardiovascular;  Laterality: N/A;  . NASAL SEPTUM SURGERY  1990s approx  . TONSILLECTOMY  11 (age 33)  . TRANSTHORACIC ECHOCARDIOGRAM  11-17-2016   dr Domenic Polite   mild LVH,  ef 55-60%, LV wall motion & diastolic function indeterminant assessment,  images were inadequate/  mild LAE/  trivial Tr    Home Medications:  Prescriptions Prior to Admission  Medication Sig Dispense Refill Last Dose  . aspirin EC 325 MG EC tablet Take 1 tablet (325 mg total) by mouth daily. (Patient taking differently: Take 325 mg by mouth every morning. ) 30 tablet 0 04/05/2017 at Unknown time  . colchicine 0.6 MG tablet Take 0.6 mg by mouth as needed.    Past Week at Unknown time  . escitalopram (LEXAPRO) 10 MG tablet Take 10 mg by mouth at bedtime.    04/07/2017 at Unknown time  .  HYDROcodone-acetaminophen (NORCO/VICODIN) 5-325 MG tablet Take 1 tablet by mouth every 6 (six) hours as needed for moderate pain.   04/07/2017 at 1800  . lisinopril (PRINIVIL,ZESTRIL) 10 MG tablet Take 5 tablets by mouth every morning.    04/07/2017 at Unknown time  . lovastatin (MEVACOR) 20 MG tablet TAKE ONE TABLET BY MOUTH AT BEDTIME, STOP TAKING ATORVASTATIN (Patient taking differently: Take 20 mg by mouth at bedtime. TAKE ONE TABLET BY MOUTH AT BEDTIME, STOP TAKING ATORVASTATIN) 90 tablet 3 04/07/2017 at Unknown time  . metFORMIN (GLUCOPHAGE) 500 MG tablet Take 500 mg by mouth 2 (two) times daily with a meal.    04/07/2017 at Unknown time  . metoprolol tartrate  (LOPRESSOR) 25 MG tablet Take 0.5 tablets (12.5 mg total) by mouth 2 (two) times daily. (Patient taking differently: Take 12.5 mg by mouth 2 (two) times daily. ) 90 tablet 3 04/08/2017 at 0800  . metroNIDAZOLE (FLAGYL) 500 MG tablet Take 1 tablet (500 mg total) by mouth 3 (three) times daily. Take at 1PM, 2PM, and 9PM the day prior to your surgery. (Patient taking differently: Take 500 mg by mouth 3 (three) times daily. Take at 1PM, 2PM, and 9PM the day prior to your surgery on 04-14-2017) 3 tablet 0 surgery next week  . Multiple Vitamin (MULTIVITAMIN WITH MINERALS) TABS tablet Take 1 tablet by mouth daily.   04/07/2017 at Unknown time  . Omega-3 Fatty Acids (FISH OIL) 1000 MG CPDR Take 1 capsule by mouth daily.   04/07/2017 at Unknown time  . omeprazole (PRILOSEC) 20 MG capsule Take 20 mg by mouth every morning.    04/08/2017 at 0800  . polyethylene glycol powder (GLYCOLAX/MIRALAX) powder Take 17 g by mouth daily as needed for constipation.    Past Week at Unknown time  . pyridoxine (B-6) 200 MG tablet Take 200 mg by mouth daily.   04/07/2017 at Unknown time  . vitamin C (ASCORBIC ACID) 500 MG tablet Take 1,000 mg by mouth daily.   04/07/2017 at Unknown time  . neomycin (MYCIFRADIN) 500 MG tablet Take 2 tablets (1,000 mg total) by mouth 3 (three) times daily. Take 2 tablets at 1PM, 2PM and 9PM the day before your surgery (Patient taking differently: Take 1,000 mg by mouth 3 (three) times daily. Take 2 tablets at 1PM, 2PM and 9PM the day before your surgery on 04-14-2017) 6 tablet 0 will take for next surgery  . ondansetron (ZOFRAN) 4 MG tablet Take 1 tablet (4 mg total) by mouth every 8 (eight) hours as needed for nausea or vomiting. (Patient not taking: Reported on 04/07/2017) 4 tablet 0 Not Taking at Unknown time  . oxyCODONE-acetaminophen (PERCOCET) 5-325 MG tablet Take 1-2 tablets by mouth every 4 (four) hours as needed. (Patient not taking: Reported on 04/07/2017) 10 tablet 0 Not Taking at Unknown time    Allergies: No Known Allergies  Family History  Problem Relation Age of Onset  . CAD Mother   . CAD Father   . Lung cancer Father   . Colon cancer Father    Social History:  reports that he has never smoked. He has never used smokeless tobacco. He reports that he does not drink alcohol or use drugs.  ROS: A complete review of systems was performed.  All systems are negative except for pertinent findings as noted. ROS   Physical Exam:  Vital signs in last 24 hours: Temp:  [98.2 F (36.8 C)] 98.2 F (36.8 C) (09/27 0930) Pulse Rate:  [50] 50 (09/27  0930) Resp:  [16] 16 (09/27 0930) BP: (136)/(66) 136/66 (09/27 0930) SpO2:  [100 %] 100 % (09/27 0930) Weight:  [97.5 kg (215 lb)] 97.5 kg (215 lb) (09/27 0930) General:  Alert and oriented, No acute distress HEENT: Normocephalic, atraumatic Neck: No JVD or lymphadenopathy Cardiovascular: Regular rate and rhythm Lungs: Regular rate and effort Abdomen: Soft, nontender, nondistended, no abdominal masses Back: No CVA tenderness Extremities: No edema Neurologic: Grossly intact  Laboratory Data:  Results for orders placed or performed during the hospital encounter of 04/08/17 (from the past 24 hour(s))  I-STAT 4, (NA,K, GLUC, HGB,HCT)     Status: Abnormal   Collection Time: 04/08/17 10:28 AM  Result Value Ref Range   Sodium 140 135 - 145 mmol/L   Potassium 4.5 3.5 - 5.1 mmol/L   Glucose, Bld 97 65 - 99 mg/dL   HCT 51.0 39.0 - 52.0 %   Hemoglobin 17.3 (H) 13.0 - 17.0 g/dL   No results found for this or any previous visit (from the past 240 hour(s)). Creatinine:  Recent Labs  04/04/17 1134  CREATININE 2.07*    Impression/Assessment:  Left ureteral calculus  Plan:  Proceed with left URS/LL/stent.  Understands risks of bleeding, infection, injury to surrounding structures  Marton Redwood, III 04/08/2017, 12:05 PM

## 2017-04-08 NOTE — Patient Instructions (Addendum)
[name] [name]  04/08/2017     @PREFPERIOPPHARMACY @   Your procedure is scheduled on  04/14/2017 .  Report to [facility] at  615  A.M.  Call this number if you have problems the morning of surgery:  [phone]   Remember:  Do not eat food or drink liquids after midnight.  Take these medicines the morning of surgery with A SIP OF WATER  Colchicine, hydrocodone, lisinopril, lopressor, prilosec.   Do not wear jewelry, make-up or nail polish.  Do not wear lotions, powders, or perfumes, or deoderant.  Do not shave 48 hours prior to surgery.  Men may shave face and neck.  Do not bring valuables to the hospital.  [facility] is not responsible for any belongings or valuables.  Contacts, dentures or bridgework may not be worn into surgery.  Leave your suitcase in the car.  After surgery it may be brought to your room.  For patients admitted to the hospital, discharge time will be determined by your treatment team.  Patients discharged the day of surgery will not be allowed to drive home.   Name and phone number of your driver:   family Special instructions:  Follow the diet instructions givne to you by Dr Arnoldo Morale and Constance Haw.  Please read over the following fact sheets that you were given. Pain Booklet, Coughing and Deep Breathing, Blood Transfusion Information, Anesthesia Post-op Instructions and Care and Recovery After Surgery       Laparoscopic Colectomy Laparoscopic colectomy is surgery to remove part or all of the large intestine (colon). This procedure may be used to treat several conditions, including:  Inflammation and infection of the colon (diverticulitis).  Tumors or masses in the colon.  Inflammatory bowel disease, such as Crohn disease or ulcerative colitis. Colectomy is an option when symptoms cannot be controlled with medicines.  Bleeding from the colon that cannot be controlled by another method.  Blockage or obstruction of the  colon.  Tell a health care provider about:  Any allergies you have.  All medicines you are taking, including vitamins, herbs, eye drops, creams, and over-the-counter medicines.  Any problems you or family members have had with anesthetic medicines.  Any blood disorders you have.  Any surgeries you have had.  Any medical conditions you have. What are the risks? Generally, this is a safe procedure. However, problems may occur, including:  Infection.  Bleeding.  Allergic reactions to medicines or dyes.  Damage to other structures or organs.  Leaking from where the colon was sewn together.  Future blockage of the small intestines from scar tissue. Another surgery may be needed to repair this.  Needing to convert to an open procedure. Complications such as damage to other organs or excessive bleeding may require the surgeon to convert from a laparoscopic procedure to an open procedure. This involves making a larger incision in the abdomen.  What happens before the procedure? Staying hydrated Follow instructions from your health care provider about hydration, which may include:  Up to 2 hours before the procedure - you may continue to drink clear liquids, such as water, clear fruit juice, black coffee, and plain tea.  Eating and drinking restrictions Follow instructions from your health care provider about eating and drinking, which may include:  8 hours before the procedure - stop eating heavy meals, meals with high fiber, or foods such as meat, fried foods, or fatty foods.  6 hours before  the procedure - stop eating light meals or foods, such as toast or cereal.  6 hours before the procedure - stop drinking milk or drinks that contain milk.  2 hours before the procedure - stop drinking clear liquids.  Medicines  Ask your health care provider about: ? Changing or stopping your regular medicines. This is especially important if you are taking diabetes medicines or blood  thinners. ? Taking medicines such as aspirin and ibuprofen. These medicines can thin your blood. Do not take these medicines before your procedure if your health care provider instructs you not to.  You may be given antibiotic medicine to clean out bacteria from your colon. Follow the directions carefully and take the medicine at the correct time. General instructions  You may be prescribed an oral bowel prep to clean out your colon in preparation for the surgery: ? Follow instructions from your health care provider about how to do this. ? Do not eat or drink anything else after you have started the bowel prep, unless your health care provider tells you it is safe to do so.  Do not use any products that contain nicotine or tobacco, such as cigarettes and e-cigarettes. If you need help quitting, ask your health care provider. What happens during the procedure?  To reduce your risk of infection: ? Your health care team will wash or sanitize their hands. ? Your skin will be washed with soap.  An IV tube will be inserted into one of your veins to deliver fluid and medication.  You will be given one of the following: ? A medicine to help you relax (sedative). ? A medicine to make you fall asleep (general anesthetic).  Small monitors will be connected to your body. They will be used to check your heart, blood pressure, and oxygen level.  A breathing tube may be placed into your lungs during the procedure.  A thin, flexible tube (catheter) will be placed into your bladder to drain urine.  A tube may be placed through your nose and into your stomach to drain stomach fluids (nasogastric tube, or NG tube).  Your abdomen will be filled with air so it expands. This gives the surgeon more room to operate and makes your organs easier to see.  Several small cuts (incisions) will be made in your abdomen.  A thin, lighted tube with a tiny camera on the end (laparoscope) will be put through one of  the small incisions. The camera on the laparoscope will send a picture to a computer screen in the operating room. This will give the surgeon a good view inside your abdomen.  Hollow tubes will be put through the other small incisions in your abdomen. The tools that are needed for the procedure will be put through these tubes.  Clamps or staples will be put on both ends of the diseased part of the colon.  The part of the intestine between the clamps or staples will be removed.  If possible, the ends of the healthy colon that remain will be stitched (sutured) or stapled together to allow your body to pass waste (stool).  Sometimes, the remaining colon cannot be stitched back together. If this is the case, a colostomy will be needed. If you need a colostomy: ? An opening to the outside of your body (stoma) will be made through your abdomen. ? The end of your colon will be brought to the opening. It will be stitched to the skin. ? A bag will be  attached to the opening. Stool will drain into this removable bag. ? The colostomy may be temporary or permanent.  The incisions from the colectomy will be closed with sutures or staples. The procedure may vary among health care providers and hospitals. What happens after the procedure?  Your blood pressure, heart rate, breathing rate, and blood oxygen level will be monitored until the medicines you were given have worn off.  You will receive fluids through an IV tube until your bowels start to work properly.  Once your bowels are working again, you will be given clear liquids first and then solid food as tolerated.  You will be given medicines to control your pain and nausea, if needed.  Do not drive for 24 hours if you were given a sedative. This information is not intended to replace advice given to you by your health care provider. Make sure you discuss any questions you have with your health care provider. Document Released: 09/19/2002 Document  Revised: 03/30/2016 Document Reviewed: 03/30/2016 Elsevier Interactive Patient Education  2018 [employer].  Open Colectomy, Care After This sheet gives you information about how to care for yourself after your procedure. Your health care provider may also give you more specific instructions. If you have problems or questions, contact your health care provider. What can I expect after the procedure? After the procedure, it is common to have:  Pain in your abdomen, especially along your incision.  Tiredness. Your energy level will return to normal over the next several weeks.  Constipation.  Nausea.  Difficulty urinating.  Follow these instructions at home: Activity  You may be able to return to most of your normal activities within 1-2 weeks, such as working, walking up stairs, and sexual activity.  Avoid activities that require a lot of energy for 4-6 weeks after surgery, such as running, climbing, and lifting heavy objects. Ask your health care provider what activities are safe for you.  Take rest breaks during the day as needed.  Do not drive for 1-2 weeks or until your health care provider says that it is safe.  Do not drive or use heavy machinery while taking prescription pain medicines.  Do not lift anything that is heavier than 10 lb (4.3 kg) until your health care provider says that it is safe. Incision care  Follow instructions from your health care provider about how to take care of your incision. Make sure you: ? Wash your hands with soap and water before you change your bandage (dressing). If soap and water are not available, use hand sanitizer. ? Change your dressing as told by your health care provider. ? Leave stitches (sutures) or staples in place. These skin closures may need to stay in place for 2 weeks or longer.  Avoid wearing tight clothing around your incision.  Protect your incision area from the sun.  Check your incision area every day for signs of  infection. Check for: ? More redness, swelling, or pain. ? More fluid or blood. ? Warmth. ? Pus or a bad smell. General instructions  Do not take baths, swim, or use a hot tub until your health care provider approves. Ask your health care provider when you may shower.  Take over-the-counter and prescription medicines, including stool softeners, only as told by your health care provider.  Eat a low-fat and low-fiber diet for the first 4 weeks after surgery.  Keep all follow-up visits as told by your health care provider. This is important. Contact a health care  provider if:  You have more redness, swelling, or pain around your incision.  You have more fluid or blood coming from your incision.  Your incision feels warm to the touch.  You have pus or a bad smell coming from your incision.  You have a fever or chills.  You do not have a bowel movement 2-3 days after surgery.  You cannot eat or drink for 24 hours or more.  You have persistent nausea and vomiting.  You have abdominal pain that gets worse and does not get better with medicine. Get help right away if:  You have chest pain.  You have shortness of breath.  You have pain or swelling in your legs.  Your incision breaks open after your sutures or staples have been removed.  You have bleeding from the rectum. This information is not intended to replace advice given to you by your health care provider. Make sure you discuss any questions you have with your health care provider. Document Released: 01/20/2011 Document Revised: 03/30/2016 Document Reviewed: 03/30/2016 Elsevier Interactive Patient Education  2018 [employer]. Diagnostic Laparoscopy A diagnostic laparoscopy is a procedure to diagnose diseases in the abdomen. During the procedure, a thin, lighted, pencil-sized instrument called a laparoscope is inserted into the abdomen through an incision. The laparoscope allows your health care provider to look at the  organs inside your body. Tell a health care provider about:  Any allergies you have.  All medicines you are taking, including vitamins, herbs, eye drops, creams, and over-the-counter medicines.  Any problems you or family members have had with anesthetic medicines.  Any blood disorders you have.  Any surgeries you have had.  Any medical conditions you have. What are the risks? Generally, this is a safe procedure. However, problems can occur, which may include:  Infection.  Bleeding.  Damage to other organs.  Allergic reaction to the anesthetics used during the procedure.  What happens before the procedure?  Do not eat or drink anything after midnight on the night before the procedure or as directed by your health care provider.  Ask your health care provider about: ? Changing or stopping your regular medicines. ? Taking medicines such as aspirin and ibuprofen. These medicines can thin your blood. Do not take these medicines before your procedure if your health care provider instructs you not to.  Plan to have someone take you home after the procedure. What happens during the procedure?  You may be given a medicine to help you relax (sedative).  You will be given a medicine to make you sleep (general anesthetic).  Your abdomen will be inflated with a gas. This will make your organs easier to see.  Small incisions will be made in your abdomen.  A laparoscope and other small instruments will be inserted into the abdomen through the incisions.  A tissue sample may be removed from an organ in the abdomen for examination.  The instruments will be removed from the abdomen.  The gas will be released.  The incisions will be closed with stitches (sutures). What happens after the procedure? Your blood pressure, heart rate, breathing rate, and blood oxygen level will be monitored often until the medicines you were given have worn off. This information is not intended to  replace advice given to you by your health care provider. Make sure you discuss any questions you have with your health care provider. Document Released: 10/05/2000 Document Revised: 11/07/2015 Document Reviewed: 02/09/2014 Elsevier Interactive Patient Education  [employer].  General Anesthesia, Adult General anesthesia is the use of medicines to make a person "go to sleep" (be unconscious) for a medical procedure. General anesthesia is often recommended when a procedure:  Is long.  Requires you to be still or in an unusual position.  Is major and can cause you to lose blood.  Is impossible to do without general anesthesia.  The medicines used for general anesthesia are called general anesthetics. In addition to making you sleep, the medicines:  Prevent pain.  Control your blood pressure.  Relax your muscles.  Tell a health care provider about:  Any allergies you have.  All medicines you are taking, including vitamins, herbs, eye drops, creams, and over-the-counter medicines.  Any problems you or family members have had with anesthetic medicines.  Types of anesthetics you have had in the past.  Any bleeding disorders you have.  Any surgeries you have had.  Any medical conditions you have.  Any history of heart or lung conditions, such as heart failure, sleep apnea, or chronic obstructive pulmonary disease (COPD).  Whether you are pregnant or may be pregnant.  Whether you use tobacco, alcohol, marijuana, or street drugs.  Any history of Armed forces logistics/support/administrative officer.  Any history of depression or anxiety. What are the risks? Generally, this is a safe procedure. However, problems may occur, including:  Allergic reaction to anesthetics.  Lung and heart problems.  Inhaling food or liquids from your stomach into your lungs (aspiration).  Injury to nerves.  Waking up during your procedure and being unable to move (rare).  Extreme agitation or a state of mental  confusion (delirium) when you wake up from the anesthetic.  Air in the bloodstream, which can lead to stroke.  These problems are more likely to develop if you are having a major surgery or if you have an advanced medical condition. You can prevent some of these complications by answering all of your health care provider's questions thoroughly and by following all pre-procedure instructions. General anesthesia can cause side effects, including:  Nausea or vomiting  A sore throat from the breathing tube.  Feeling cold or shivery.  Feeling tired, washed out, or achy.  Sleepiness or drowsiness.  Confusion or agitation.  What happens before the procedure? Staying hydrated Follow instructions from your health care provider about hydration, which may include:  Up to 2 hours before the procedure - you may continue to drink clear liquids, such as water, clear fruit juice, black coffee, and plain tea.  Eating and drinking restrictions Follow instructions from your health care provider about eating and drinking, which may include:  8 hours before the procedure - stop eating heavy meals or foods such as meat, fried foods, or fatty foods.  6 hours before the procedure - stop eating light meals or foods, such as toast or cereal.  6 hours before the procedure - stop drinking milk or drinks that contain milk.  2 hours before the procedure - stop drinking clear liquids.  Medicines  Ask your health care provider about: ? Changing or stopping your regular medicines. This is especially important if you are taking diabetes medicines or blood thinners. ? Taking medicines such as aspirin and ibuprofen. These medicines can thin your blood. Do not take these medicines before your procedure if your health care provider instructs you not to. ? Taking new dietary supplements or medicines. Do not take these during the week before your procedure unless your health care provider approves them.  If you  are told  to take a medicine or to continue taking a medicine on the day of the procedure, take the medicine with sips of water. General instructions   Ask if you will be going home the same day, the following day, or after a longer hospital stay. ? Plan to have someone take you home. ? Plan to have someone stay with you for the first 24 hours after you leave the hospital or clinic.  For 3-6 weeks before the procedure, try not to use any tobacco products, such as cigarettes, chewing tobacco, and e-cigarettes.  You may brush your teeth on the morning of the procedure, but make sure to spit out the toothpaste. What happens during the procedure?  You will be given anesthetics through a mask and through an IV tube in one of your veins.  You may receive medicine to help you relax (sedative).  As soon as you are asleep, a breathing tube may be used to help you breathe.  An anesthesia specialist will stay with you throughout the procedure. He or she will help keep you comfortable and safe by continuing to give you medicines and adjusting the amount of medicine that you get. He or she will also watch your blood pressure, pulse, and oxygen levels to make sure that the anesthetics do not cause any problems.  If a breathing tube was used to help you breathe, it will be removed before you wake up. The procedure may vary among health care providers and hospitals. What happens after the procedure?  You will wake up, often slowly, after the procedure is complete, usually in a recovery area.  Your blood pressure, heart rate, breathing rate, and blood oxygen level will be monitored until the medicines you were given have worn off.  You may be given medicine to help you calm down if you feel anxious or agitated.  If you will be going home the same day, your health care provider may check to make sure you can stand, drink, and urinate.  Your health care providers will treat your pain and side effects  before you go home.  Do not drive for 24 hours if you received a sedative.  You may: ? Feel nauseous and vomit. ? Have a sore throat. ? Have mental slowness. ? Feel cold or shivery. ? Feel sleepy. ? Feel tired. ? Feel sore or achy, even in parts of your body where you did not have surgery. This information is not intended to replace advice given to you by your health care provider. Make sure you discuss any questions you have with your health care provider. Document Released: 10/06/2007 Document Revised: 12/10/2015 Document Reviewed: 06/13/2015 Elsevier Interactive Patient Education  2018 [place] Anesthesia, Adult, Care After These instructions provide you with information about caring for yourself after your procedure. Your health care provider may also give you more specific instructions. Your treatment has been planned according to current medical practices, but problems sometimes occur. Call your health care provider if you have any problems or questions after your procedure. What can I expect after the procedure? After the procedure, it is common to have:  Vomiting.  A sore throat.  Mental slowness.  It is common to feel:  Nauseous.  Cold or shivery.  Sleepy.  Tired.  Sore or achy, even in parts of your body where you did not have surgery.  Follow these instructions at home: For at least 24 hours after the procedure:  Do not: ? Participate in activities where you  could fall or become injured. ? Drive. ? Use heavy machinery. ? Drink alcohol. ? Take sleeping pills or medicines that cause drowsiness. ? Make important decisions or sign legal documents. ? Take care of children on your own.  Rest. Eating and drinking  If you vomit, drink water, juice, or soup when you can drink without vomiting.  Drink enough fluid to keep your urine clear or pale yellow.  Make sure you have little or no nausea before eating solid foods.  Follow the diet  recommended by your health care provider. General instructions  Have a responsible adult stay with you until you are awake and alert.  Return to your normal activities as told by your health care provider. Ask your health care provider what activities are safe for you.  Take over-the-counter and prescription medicines only as told by your health care provider.  If you smoke, do not smoke without supervision.  Keep all follow-up visits as told by your health care provider. This is important. Contact a health care provider if:  You continue to have nausea or vomiting at home, and medicines are not helpful.  You cannot drink fluids or start eating again.  You cannot urinate after 8-12 hours.  You develop a skin rash.  You have fever.  You have increasing redness at the site of your procedure. Get help right away if:  You have difficulty breathing.  You have chest pain.  You have unexpected bleeding.  You feel that you are having a life-threatening or urgent problem. This information is not intended to replace advice given to you by your health care provider. Make sure you discuss any questions you have with your health care provider. Document Released: 10/05/2000 Document Revised: 12/02/2015 Document Reviewed: 06/13/2015 Elsevier Interactive Patient Education  [employer].

## 2017-04-08 NOTE — Anesthesia Preprocedure Evaluation (Addendum)
Anesthesia Evaluation  Patient identified by MRN, date of birth, ID band Patient awake    Reviewed: Allergy & Precautions, NPO status , Patient's Chart, lab work & pertinent test results  Airway Mallampati: III  TM Distance: >3 FB Neck ROM: Full    Dental no notable dental hx.    Pulmonary sleep apnea and Continuous Positive Airway Pressure Ventilation ,    Pulmonary exam normal breath sounds clear to auscultation       Cardiovascular hypertension, + CAD and + CABG (2014)  Normal cardiovascular exam Rhythm:Regular Rate:Normal     Neuro/Psych negative neurological ROS  negative psych ROS   GI/Hepatic Neg liver ROS, GERD  Medicated and Controlled,  Endo/Other  diabetes, Type 2, Oral Hypoglycemic Agents  Renal/GU negative Renal ROS  negative genitourinary   Musculoskeletal negative musculoskeletal ROS (+)   Abdominal   Peds negative pediatric ROS (+)  Hematology negative hematology ROS (+)   Anesthesia Other Findings   Reproductive/Obstetrics negative OB ROS                            Anesthesia Physical Anesthesia Plan  ASA: III  Anesthesia Plan: General   Post-op Pain Management:    Induction: Intravenous  PONV Risk Score and Plan: 3 and Ondansetron, Dexamethasone, Midazolam and Treatment may vary due to age or medical condition  Airway Management Planned: LMA  Additional Equipment:   Intra-op Plan:   Post-operative Plan: Extubation in OR  Informed Consent: I have reviewed the patients History and Physical, chart, labs and discussed the procedure including the risks, benefits and alternatives for the proposed anesthesia with the patient or authorized representative who has indicated his/her understanding and acceptance.   Dental advisory given  Plan Discussed with: CRNA  Anesthesia Plan Comments: (Previous Grade IV view with Mac 3 for CABG requiring bougie intubation)         Anesthesia Quick Evaluation

## 2017-04-09 ENCOUNTER — Encounter (HOSPITAL_BASED_OUTPATIENT_CLINIC_OR_DEPARTMENT_OTHER): Payer: Self-pay | Admitting: Urology

## 2017-04-09 ENCOUNTER — Other Ambulatory Visit (HOSPITAL_COMMUNITY): Payer: BC Managed Care – PPO

## 2017-04-10 NOTE — Anesthesia Postprocedure Evaluation (Signed)
Anesthesia Post Note  Patient: CHARON AKAMINE  Procedure(s) Performed: Procedure(s) (LRB): CYSTOSCOPY WITH RETROGRADE PYELOGRAM, URETEROSCOPY,STENT PLACEMENT (Left)     Patient location during evaluation: PACU Anesthesia Type: General Level of consciousness: awake and alert Pain management: pain level controlled Vital Signs Assessment: post-procedure vital signs reviewed and stable Respiratory status: spontaneous breathing, nonlabored ventilation, respiratory function stable and patient connected to nasal cannula oxygen Cardiovascular status: blood pressure returned to baseline and stable Postop Assessment: no apparent nausea or vomiting Anesthetic complications: no    Last Vitals:  Vitals:   04/08/17 1338 04/08/17 1435  BP:  (!) 132/57  Pulse: (!) 59 (!) 55  Resp: (!) 21 16  Temp:  36.8 C  SpO2: 94%     Last Pain:  Vitals:   04/08/17 0958  TempSrc:   PainSc: 3    Pain Goal: Patients Stated Pain Goal: 10 (04/08/17 0958)               Montez Hageman

## 2017-04-12 ENCOUNTER — Encounter (HOSPITAL_COMMUNITY)
Admission: RE | Admit: 2017-04-12 | Discharge: 2017-04-12 | Disposition: A | Discharge status: Home / Self Care | Payer: BC Managed Care – PPO | Source: Ambulatory Visit | Attending: General Surgery | Admitting: General Surgery

## 2017-04-12 ENCOUNTER — Encounter (HOSPITAL_COMMUNITY): Payer: Self-pay

## 2017-04-12 HISTORY — DX: Anxiety disorder, unspecified: F41.9

## 2017-04-13 ENCOUNTER — Other Ambulatory Visit (HOSPITAL_COMMUNITY): Payer: BC Managed Care – PPO

## 2017-04-13 ENCOUNTER — Ambulatory Visit (HOSPITAL_COMMUNITY)
Admission: RE | Admit: 2017-04-13 | Discharge: 2017-04-13 | Disposition: A | Discharge status: Home / Self Care | Payer: BC Managed Care – PPO | Source: Ambulatory Visit | Attending: General Surgery | Admitting: General Surgery

## 2017-04-13 ENCOUNTER — Encounter (HOSPITAL_COMMUNITY)
Admission: RE | Admit: 2017-04-13 | Discharge: 2017-04-13 | Disposition: A | Discharge status: Home / Self Care | Payer: BC Managed Care – PPO | Source: Ambulatory Visit | Attending: General Surgery | Admitting: General Surgery

## 2017-04-13 ENCOUNTER — Encounter (HOSPITAL_COMMUNITY)
Admission: RE | Disposition: A | Discharge status: Home / Self Care | Payer: Self-pay | Source: Ambulatory Visit | Attending: General Surgery

## 2017-04-13 ENCOUNTER — Encounter (HOSPITAL_COMMUNITY): Payer: Self-pay | Admitting: *Deleted

## 2017-04-13 DIAGNOSIS — R933 Abnormal findings on diagnostic imaging of other parts of digestive tract: Secondary | ICD-10-CM | POA: Diagnosis not present

## 2017-04-13 DIAGNOSIS — K4020 Bilateral inguinal hernia, without obstruction or gangrene, not specified as recurrent: Secondary | ICD-10-CM | POA: Diagnosis not present

## 2017-04-13 DIAGNOSIS — Z951 Presence of aortocoronary bypass graft: Secondary | ICD-10-CM | POA: Insufficient documentation

## 2017-04-13 DIAGNOSIS — K219 Gastro-esophageal reflux disease without esophagitis: Secondary | ICD-10-CM | POA: Insufficient documentation

## 2017-04-13 DIAGNOSIS — N281 Cyst of kidney, acquired: Secondary | ICD-10-CM | POA: Insufficient documentation

## 2017-04-13 DIAGNOSIS — G4733 Obstructive sleep apnea (adult) (pediatric): Secondary | ICD-10-CM | POA: Diagnosis not present

## 2017-04-13 DIAGNOSIS — E119 Type 2 diabetes mellitus without complications: Secondary | ICD-10-CM | POA: Diagnosis not present

## 2017-04-13 DIAGNOSIS — K429 Umbilical hernia without obstruction or gangrene: Secondary | ICD-10-CM | POA: Diagnosis not present

## 2017-04-13 DIAGNOSIS — M109 Gout, unspecified: Secondary | ICD-10-CM | POA: Insufficient documentation

## 2017-04-13 DIAGNOSIS — R109 Unspecified abdominal pain: Secondary | ICD-10-CM | POA: Diagnosis present

## 2017-04-13 DIAGNOSIS — N132 Hydronephrosis with renal and ureteral calculous obstruction: Secondary | ICD-10-CM | POA: Insufficient documentation

## 2017-04-13 DIAGNOSIS — I2510 Atherosclerotic heart disease of native coronary artery without angina pectoris: Secondary | ICD-10-CM | POA: Diagnosis not present

## 2017-04-13 DIAGNOSIS — Z8711 Personal history of peptic ulcer disease: Secondary | ICD-10-CM | POA: Diagnosis not present

## 2017-04-13 DIAGNOSIS — I10 Essential (primary) hypertension: Secondary | ICD-10-CM | POA: Insufficient documentation

## 2017-04-13 DIAGNOSIS — Z79899 Other long term (current) drug therapy: Secondary | ICD-10-CM | POA: Insufficient documentation

## 2017-04-13 DIAGNOSIS — I252 Old myocardial infarction: Secondary | ICD-10-CM | POA: Diagnosis not present

## 2017-04-13 HISTORY — PX: COLONOSCOPY: SHX5424

## 2017-04-13 LAB — CBC WITH DIFFERENTIAL/PLATELET
BASOS PCT: 0 %
Basophils Absolute: 0 10*3/uL (ref 0.0–0.1)
EOS ABS: 0.2 10*3/uL (ref 0.0–0.7)
Eosinophils Relative: 3 %
HCT: 38.1 % — ABNORMAL LOW (ref 39.0–52.0)
Hemoglobin: 12.8 g/dL — ABNORMAL LOW (ref 13.0–17.0)
LYMPHS ABS: 2.2 10*3/uL (ref 0.7–4.0)
Lymphocytes Relative: 30 %
MCH: 30 pg (ref 26.0–34.0)
MCHC: 33.6 g/dL (ref 30.0–36.0)
MCV: 89.4 fL (ref 78.0–100.0)
Monocytes Absolute: 0.4 10*3/uL (ref 0.1–1.0)
Monocytes Relative: 5 %
NEUTROS PCT: 62 %
Neutro Abs: 4.6 10*3/uL (ref 1.7–7.7)
Platelets: 294 10*3/uL (ref 150–400)
RBC: 4.26 MIL/uL (ref 4.22–5.81)
RDW: 14 % (ref 11.5–15.5)
WBC: 7.4 10*3/uL (ref 4.0–10.5)

## 2017-04-13 LAB — BASIC METABOLIC PANEL
ANION GAP: 12 (ref 5–15)
BUN: 17 mg/dL (ref 6–20)
CALCIUM: 9.7 mg/dL (ref 8.9–10.3)
CO2: 25 mmol/L (ref 22–32)
Chloride: 103 mmol/L (ref 101–111)
Creatinine, Ser: 1.46 mg/dL — ABNORMAL HIGH (ref 0.61–1.24)
GFR calc Af Amer: 57 mL/min — ABNORMAL LOW (ref >60)
GFR calc non Af Amer: 49 mL/min — ABNORMAL LOW (ref >60)
GLUCOSE: 107 mg/dL — AB (ref 65–99)
Potassium: 4.1 mmol/L (ref 3.5–5.1)
Sodium: 140 mmol/L (ref 135–145)

## 2017-04-13 LAB — TYPE AND SCREEN
ABO/RH(D): O POS
ANTIBODY SCREEN: NEGATIVE

## 2017-04-13 LAB — GLUCOSE, CAPILLARY: GLUCOSE-CAPILLARY: 101 mg/dL — AB (ref 65–99)

## 2017-04-13 LAB — PROTIME-INR
INR: 1.01
Prothrombin Time: 13.2 seconds (ref 11.4–15.2)

## 2017-04-13 SURGERY — COLONOSCOPY
Anesthesia: Moderate Sedation

## 2017-04-13 MED ORDER — MEPERIDINE HCL 50 MG/ML IJ SOLN
INTRAMUSCULAR | Status: DC | PRN
  Administered 2017-04-13: 50 mg via INTRAVENOUS

## 2017-04-13 MED ORDER — MIDAZOLAM HCL 5 MG/5ML IJ SOLN
INTRAMUSCULAR | Status: DC | PRN
Start: 2017-04-13 — End: 2017-04-13
  Administered 2017-04-13: 1 mg via INTRAVENOUS
  Administered 2017-04-13: 3 mg via INTRAVENOUS

## 2017-04-13 MED ORDER — SODIUM CHLORIDE 0.9 % IV SOLN
INTRAVENOUS | Status: DC
  Administered 2017-04-13: 08:00:00 via INTRAVENOUS

## 2017-04-13 MED ORDER — MEPERIDINE HCL 50 MG/ML IJ SOLN
INTRAMUSCULAR | Status: AC
  Filled 2017-04-13: qty 1

## 2017-04-13 MED ORDER — MIDAZOLAM HCL 5 MG/5ML IJ SOLN
INTRAMUSCULAR | Status: AC
  Filled 2017-04-13: qty 5

## 2017-04-13 NOTE — Interval H&P Note (Signed)
History and Physical Interval Note:  04/13/2017 8:37 AM  [name] [name]  has presented today for surgery, with the diagnosis of appendiceal mass  The various methods of treatment have been discussed with the patient and family. After consideration of risks, benefits and other options for treatment, the patient has consented to  Procedure(s): COLONOSCOPY (N/A) as a surgical intervention .  The patient's history has been reviewed, patient examined, no change in status, stable for surgery.  I have reviewed the patient's chart and labs.  Questions were answered to the patient's satisfaction.     Aviva Signs

## 2017-04-13 NOTE — Op Note (Signed)
[facility] Patient Name: [name] [name] Procedure Date: 04/13/2017 8:15 AM MRN: [id number] Date of Birth: 1950/11/17 Attending MD: Aviva Signs , MD CSN: [id number] Age: 66 Admit Type: Outpatient Procedure:                Colonoscopy Indications:              Abnormal CT of the GI tract Providers:                Aviva Signs, MD, Otis Peak B. Gwenlyn Perking RN, RN, Aram Candela Referring MD:              Medicines:                Midazolam 4 mg IV, Meperidine 50 mg IV Complications:            No immediate complications. Estimated blood loss:                            None. Estimated Blood Loss:     Estimated blood loss: none. Procedure:                Pre-Anesthesia Assessment:                           - Prior to the procedure, a History and Physical                            was performed, and patient medications and                            allergies were reviewed. The patient is competent.                            The risks and benefits of the procedure and the                            sedation options and risks were discussed with the                            patient. All questions were answered and informed                            consent was obtained. Patient identification and                            proposed procedure were verified by the physician,                            the nurse and the technician in the endoscopy                            suite. Mental Status Examination: alert and  oriented. Airway Examination: normal oropharyngeal                            airway and neck mobility. Respiratory Examination:                            clear to auscultation. CV Examination: RRR, no                            murmurs, no S3 or S4. Prophylactic Antibiotics: The                            patient does not require prophylactic antibiotics.                            Prior Anticoagulants: The patient has  taken no                            previous anticoagulant or antiplatelet agents. ASA                            Grade Assessment: II - A patient with mild systemic                            disease. After reviewing the risks and benefits,                            the patient was deemed in satisfactory condition to                            undergo the procedure. The anesthesia plan was to                            use moderate sedation / analgesia (conscious                            sedation). Immediately prior to administration of                            medications, the patient was re-assessed for                            adequacy to receive sedatives. The heart rate,                            respiratory rate, oxygen saturations, blood                            pressure, adequacy of pulmonary ventilation, and                            response to care were monitored throughout the  procedure. The physical status of the patient was                            re-assessed after the procedure.                           After obtaining informed consent, the colonoscope                            was passed under direct vision. Throughout the                            procedure, the patient's blood pressure, pulse, and                            oxygen saturations were monitored continuously. The                            EC-[id number] ([id number]) scope was introduced through                            the anus and advanced to the the cecum, identified                            by the appendiceal orifice, ileocecal valve and                            palpation. The colonoscopy was performed without                            difficulty. The patient tolerated the procedure                            well. The quality of the bowel preparation was                            adequate. No anatomical landmarks were                            photographed. The  quality of the bowel preparation                            was adequate. The total duration of the procedure                            was 16 minutes. Scope In: 8:46:26 AM Scope Out: 9:00:02 AM Scope Withdrawal Time: 0 hours 4 minutes 29 seconds  Total Procedure Duration: 0 hours 13 minutes 36 seconds  Findings:      The entire examined colon appeared normal on direct and retroflexion       views. No bulge at appendiceal orafice. Terminal ileum wnl. Impression:               - The entire examined colon is normal on direct and  retroflexion views.                           - No specimens collected. Moderate Sedation:      Moderate (conscious) sedation was administered by the endoscopy nurse       and supervised by the endoscopist. The following parameters were       monitored: oxygen saturation, heart rate, blood pressure, and response       to care. Total physician intraservice time was 16 minutes. Recommendation:           - Patient has a contact number available for                            emergencies. The signs and symptoms of potential                            delayed complications were discussed with the                            patient. Return to normal activities tomorrow.                            Written discharge instructions were provided to the                            patient.                           - Written discharge instructions were provided to                            the patient.                           - The signs and symptoms of potential delayed                            complications were discussed with the patient.                           - Patient has a contact number available for                            emergencies.                           - Return to normal activities tomorrow.                           - Resume previous diet.                           - Repeat colonoscopy in 10 years for screening                             purposes.                           -  Continue present medications. Procedure Code(s):        --- Professional ---                           [phone], Colonoscopy, flexible; diagnostic, including                            collection of specimen(s) by brushing or washing,                            when performed (separate procedure)                           99152, Moderate sedation services provided by the                            same physician or other qualified health care                            professional performing the diagnostic or                            therapeutic service that the sedation supports,                            requiring the presence of an independent trained                            observer to assist in the monitoring of the                            patient's level of consciousness and physiological                            status; initial 15 minutes of intraservice time,                            patient age 24 years or older Diagnosis Code(s):        --- Professional ---                           R93.3, Abnormal findings on diagnostic imaging of                            other parts of digestive tract CPT copyright 2016 American Medical Association. All rights reserved. The codes documented in this report are preliminary and upon coder review may  be revised to meet current compliance requirements. Aviva Signs, MD Aviva Signs, MD 04/13/2017 9:08:20 AM This report has been signed electronically. Number of Addenda: 0

## 2017-04-13 NOTE — Discharge Instructions (Signed)
Colonoscopy, Adult, Care After This sheet gives you information about how to care for yourself after your procedure. Your health care provider may also give you more specific instructions. If you have problems or questions, contact your health care provider. What can I expect after the procedure? After the procedure, it is common to have:  A small amount of blood in your stool for 24 hours after the procedure.  Some gas.  Mild abdominal cramping or bloating.  Follow these instructions at home: General instructions   For the first 24 hours after the procedure: ? Do not drive or use machinery. ? Do not sign important documents. ? Do not drink alcohol. ? Do your regular daily activities at a slower pace than normal. ? Eat soft, easy-to-digest foods. ? Rest often.  Take over-the-counter or prescription medicines only as told by your health care provider.  It is up to you to get the results of your procedure. Ask your health care provider, or the department performing the procedure, when your results will be ready. Relieving cramping and bloating  Try walking around when you have cramps or feel bloated.  Apply heat to your abdomen as told by your health care provider. Use a heat source that your health care provider recommends, such as a moist heat pack or a heating pad. ? Place a towel between your skin and the heat source. ? Leave the heat on for 20-30 minutes. ? Remove the heat if your skin turns bright red. This is especially important if you are unable to feel pain, heat, or cold. You may have a greater risk of getting burned. Eating and drinking  Drink enough fluid to keep your urine clear or pale yellow.  Resume your normal diet as instructed by your health care provider. Avoid heavy or fried foods that are hard to digest.  Avoid drinking alcohol for as long as instructed by your health care provider. Contact a health care provider if:  You have blood in your stool 2-3  days after the procedure. Get help right away if:  You have more than a small spotting of blood in your stool.  You pass large blood clots in your stool.  Your abdomen is swollen.  You have nausea or vomiting.  You have a fever.  You have increasing abdominal pain that is not relieved with medicine. This information is not intended to replace advice given to you by your health care provider. Make sure you discuss any questions you have with your health care provider. Document Released: 02/11/2004 Document Revised: 03/23/2016 Document Reviewed: 09/10/2015 Elsevier Interactive Patient Education  2018 [place] Liquid Diet A clear liquid diet means that you only have liquids that you can see through. You do not eat any food on this diet. Most people need to follow this diet for only a short time. What do I need to know about this diet?  A clear liquid is a liquid that you can see through when you hold it up to a light.  This diet does not give you all the nutrients that you need. Choose a variety of the liquids that your doctor says you can drink on this diet. That way, you will get as many nutrients as possible.  If you are not sure whether you can have certain items, ask your doctor. What can I have?  Water and flavored water.  Fruit juices that do not have pulp, such as cranberry juice and apple juice.  Tea  and coffee without milk or cream.  Clear bouillon or broth.  Broth-based soups that have been strained.  Flavored gelatins.  Honey.  Sugar water.  Frozen ice or frozen ice pops that do not have any milk, yogurt, fruit pieces, or fruit pulp in them.  Clear sodas.  Clear sports drinks. The items listed above may not be a complete list of recommended liquids. Contact your food and nutrition expert (dietitian) for more options. What can I not have?  Juices that have pulp.  Milk.  Cream or cream-based soups.  Yogurt. The items listed above may  not be a complete list of liquids to avoid. Contact your food and nutrition expert for more information. Summary  A clear liquid diet is a diet that includes only liquids that you can see through.  The goal of this diet is to help you recover.  Make sure to avoid liquids with milk, cream, or pulp while you are on this diet. This information is not intended to replace advice given to you by your health care provider. Make sure you discuss any questions you have with your health care provider. Document Released: 06/11/2008 Document Revised: 02/10/2016 Document Reviewed: 05/26/2013 Elsevier Interactive Patient Education  2017 [employer].

## 2017-04-14 ENCOUNTER — Inpatient Hospital Stay (HOSPITAL_COMMUNITY): Payer: BC Managed Care – PPO | Admitting: Anesthesiology

## 2017-04-14 ENCOUNTER — Observation Stay (HOSPITAL_COMMUNITY)
Admission: RE | Admit: 2017-04-14 | Discharge: 2017-04-15 | Disposition: A | Discharge status: Home / Self Care | Payer: BC Managed Care – PPO | Source: Ambulatory Visit | Attending: General Surgery | Admitting: General Surgery

## 2017-04-14 ENCOUNTER — Encounter (HOSPITAL_COMMUNITY): Payer: Self-pay | Admitting: *Deleted

## 2017-04-14 ENCOUNTER — Encounter (HOSPITAL_COMMUNITY)
Admission: RE | Disposition: A | Discharge status: Home / Self Care | Payer: Self-pay | Source: Ambulatory Visit | Attending: General Surgery

## 2017-04-14 ENCOUNTER — Observation Stay (HOSPITAL_COMMUNITY): Payer: BC Managed Care – PPO

## 2017-04-14 DIAGNOSIS — Z0389 Encounter for observation for other suspected diseases and conditions ruled out: Secondary | ICD-10-CM

## 2017-04-14 DIAGNOSIS — Z87442 Personal history of urinary calculi: Secondary | ICD-10-CM | POA: Diagnosis not present

## 2017-04-14 DIAGNOSIS — G4733 Obstructive sleep apnea (adult) (pediatric): Secondary | ICD-10-CM | POA: Diagnosis not present

## 2017-04-14 DIAGNOSIS — I25708 Atherosclerosis of coronary artery bypass graft(s), unspecified, with other forms of angina pectoris: Secondary | ICD-10-CM

## 2017-04-14 DIAGNOSIS — N183 Chronic kidney disease, stage 3 (moderate): Secondary | ICD-10-CM

## 2017-04-14 DIAGNOSIS — D490 Neoplasm of unspecified behavior of digestive system: Principal | ICD-10-CM | POA: Insufficient documentation

## 2017-04-14 DIAGNOSIS — R1900 Intra-abdominal and pelvic swelling, mass and lump, unspecified site: Secondary | ICD-10-CM

## 2017-04-14 DIAGNOSIS — E785 Hyperlipidemia, unspecified: Secondary | ICD-10-CM | POA: Insufficient documentation

## 2017-04-14 DIAGNOSIS — Z951 Presence of aortocoronary bypass graft: Secondary | ICD-10-CM | POA: Insufficient documentation

## 2017-04-14 DIAGNOSIS — E1122 Type 2 diabetes mellitus with diabetic chronic kidney disease: Secondary | ICD-10-CM | POA: Diagnosis not present

## 2017-04-14 DIAGNOSIS — Z79899 Other long term (current) drug therapy: Secondary | ICD-10-CM | POA: Insufficient documentation

## 2017-04-14 DIAGNOSIS — I2510 Atherosclerotic heart disease of native coronary artery without angina pectoris: Secondary | ICD-10-CM | POA: Diagnosis not present

## 2017-04-14 DIAGNOSIS — I252 Old myocardial infarction: Secondary | ICD-10-CM | POA: Insufficient documentation

## 2017-04-14 DIAGNOSIS — K219 Gastro-esophageal reflux disease without esophagitis: Secondary | ICD-10-CM | POA: Insufficient documentation

## 2017-04-14 DIAGNOSIS — Z7982 Long term (current) use of aspirin: Secondary | ICD-10-CM | POA: Diagnosis not present

## 2017-04-14 DIAGNOSIS — I959 Hypotension, unspecified: Secondary | ICD-10-CM | POA: Insufficient documentation

## 2017-04-14 DIAGNOSIS — I10 Essential (primary) hypertension: Secondary | ICD-10-CM | POA: Diagnosis not present

## 2017-04-14 DIAGNOSIS — N189 Chronic kidney disease, unspecified: Secondary | ICD-10-CM | POA: Diagnosis not present

## 2017-04-14 DIAGNOSIS — R9431 Abnormal electrocardiogram [ECG] [EKG]: Secondary | ICD-10-CM

## 2017-04-14 DIAGNOSIS — Z5309 Procedure and treatment not carried out because of other contraindication: Secondary | ICD-10-CM | POA: Insufficient documentation

## 2017-04-14 DIAGNOSIS — F419 Anxiety disorder, unspecified: Secondary | ICD-10-CM | POA: Diagnosis not present

## 2017-04-14 DIAGNOSIS — M109 Gout, unspecified: Secondary | ICD-10-CM | POA: Insufficient documentation

## 2017-04-14 DIAGNOSIS — Z7984 Long term (current) use of oral hypoglycemic drugs: Secondary | ICD-10-CM | POA: Diagnosis not present

## 2017-04-14 DIAGNOSIS — I129 Hypertensive chronic kidney disease with stage 1 through stage 4 chronic kidney disease, or unspecified chronic kidney disease: Secondary | ICD-10-CM | POA: Diagnosis not present

## 2017-04-14 DIAGNOSIS — I9589 Other hypotension: Secondary | ICD-10-CM

## 2017-04-14 LAB — BASIC METABOLIC PANEL
ANION GAP: 8 (ref 5–15)
BUN: 20 mg/dL (ref 6–20)
CO2: 24 mmol/L (ref 22–32)
Calcium: 8.8 mg/dL — ABNORMAL LOW (ref 8.9–10.3)
Chloride: 103 mmol/L (ref 101–111)
Creatinine, Ser: 1.38 mg/dL — ABNORMAL HIGH (ref 0.61–1.24)
GFR calc non Af Amer: 53 mL/min — ABNORMAL LOW (ref >60)
Glucose, Bld: 118 mg/dL — ABNORMAL HIGH (ref 65–99)
Potassium: 3.9 mmol/L (ref 3.5–5.1)
SODIUM: 135 mmol/L (ref 135–145)

## 2017-04-14 LAB — CEA: CEA1: 4.3 ng/mL (ref 0.0–4.7)

## 2017-04-14 LAB — GLUCOSE, CAPILLARY
GLUCOSE-CAPILLARY: 83 mg/dL (ref 65–99)
GLUCOSE-CAPILLARY: 104 mg/dL — AB (ref 65–99)
GLUCOSE-CAPILLARY: 191 mg/dL — AB (ref 65–99)

## 2017-04-14 LAB — CBC
HEMATOCRIT: 34.2 % — AB (ref 39.0–52.0)
HEMOGLOBIN: 11.5 g/dL — AB (ref 13.0–17.0)
MCH: 29.9 pg (ref 26.0–34.0)
MCHC: 33.6 g/dL (ref 30.0–36.0)
MCV: 89.1 fL (ref 78.0–100.0)
Platelets: 201 10*3/uL (ref 150–400)
RBC: 3.84 MIL/uL — ABNORMAL LOW (ref 4.22–5.81)
RDW: 13.7 % (ref 11.5–15.5)
WBC: 7.2 10*3/uL (ref 4.0–10.5)

## 2017-04-14 LAB — TROPONIN I

## 2017-04-14 LAB — CANCER ANTIGEN 19-9: CA 19-9: 12 U/mL (ref 0–35)

## 2017-04-14 SURGERY — CANCELLED PROCEDURE
Anesthesia: General | Laterality: Right

## 2017-04-14 MED ORDER — FENTANYL CITRATE (PF) 100 MCG/2ML IJ SOLN
INTRAMUSCULAR | Status: DC | PRN
  Administered 2017-04-14: 100 ug via INTRAVENOUS

## 2017-04-14 MED ORDER — METOPROLOL TARTRATE 25 MG PO TABS
12.5000 mg | ORAL_TABLET | Freq: Two times a day (BID) | ORAL | Status: DC
  Administered 2017-04-14: 12.5 mg via ORAL
  Filled 2017-04-14 (×2): qty 1

## 2017-04-14 MED ORDER — PROPOFOL 10 MG/ML IV BOLUS
INTRAVENOUS | Status: AC
  Filled 2017-04-14: qty 20

## 2017-04-14 MED ORDER — ADULT MULTIVITAMIN W/MINERALS CH
1.0000 | ORAL_TABLET | Freq: Every day | ORAL | Status: DC
  Administered 2017-04-14 – 2017-04-15 (×2): 1 via ORAL
  Filled 2017-04-14 (×2): qty 1

## 2017-04-14 MED ORDER — KCL IN DEXTROSE-NACL 10-5-0.45 MEQ/L-%-% IV SOLN
INTRAVENOUS | Status: DC
  Administered 2017-04-14: 11:00:00 via INTRAVENOUS
  Filled 2017-04-14 (×3): qty 1000

## 2017-04-14 MED ORDER — DEXTROSE 50 % IV SOLN
INTRAVENOUS | Status: AC
  Filled 2017-04-14: qty 50

## 2017-04-14 MED ORDER — ONDANSETRON HCL 4 MG/2ML IJ SOLN
INTRAMUSCULAR | Status: AC
  Filled 2017-04-14: qty 2

## 2017-04-14 MED ORDER — ONDANSETRON HCL 4 MG/2ML IJ SOLN
4.0000 mg | Freq: Once | INTRAMUSCULAR | Status: AC
  Administered 2017-04-14: 4 mg via INTRAVENOUS

## 2017-04-14 MED ORDER — PROPOFOL 10 MG/ML IV BOLUS
INTRAVENOUS | Status: DC | PRN
  Administered 2017-04-14: 200 mg via INTRAVENOUS

## 2017-04-14 MED ORDER — EPHEDRINE SULFATE 50 MG/ML IJ SOLN
INTRAMUSCULAR | Status: AC
  Filled 2017-04-14: qty 2

## 2017-04-14 MED ORDER — LACTATED RINGERS IV SOLN
INTRAVENOUS | Status: DC
  Administered 2017-04-14: 08:00:00 via INTRAVENOUS
  Administered 2017-04-14: 1000 mL via INTRAVENOUS

## 2017-04-14 MED ORDER — CHLORHEXIDINE GLUCONATE CLOTH 2 % EX PADS: 6.0000 | MEDICATED_PAD | Freq: Once | CUTANEOUS | Status: DC

## 2017-04-14 MED ORDER — ESCITALOPRAM OXALATE 10 MG PO TABS
10.0000 mg | ORAL_TABLET | Freq: Every day | ORAL | Status: DC
  Administered 2017-04-14: 10 mg via ORAL
  Filled 2017-04-14: qty 1

## 2017-04-14 MED ORDER — EPHEDRINE SULFATE 50 MG/ML IJ SOLN
INTRAMUSCULAR | Status: DC | PRN
  Administered 2017-04-14: 5 mg via INTRAVENOUS
  Administered 2017-04-14: 10 mg via INTRAVENOUS
  Administered 2017-04-14: 15 mg via INTRAVENOUS
  Administered 2017-04-14 (×2): 10 mg via INTRAVENOUS

## 2017-04-14 MED ORDER — ROCURONIUM BROMIDE 50 MG/5ML IV SOLN
INTRAVENOUS | Status: AC
  Filled 2017-04-14: qty 1

## 2017-04-14 MED ORDER — SUCCINYLCHOLINE CHLORIDE 20 MG/ML IJ SOLN
INTRAMUSCULAR | Status: DC | PRN
  Administered 2017-04-14: 140 mg via INTRAVENOUS

## 2017-04-14 MED ORDER — SODIUM CHLORIDE 0.9 % IJ SOLN
INTRAMUSCULAR | Status: AC
  Filled 2017-04-14: qty 10

## 2017-04-14 MED ORDER — ENOXAPARIN SODIUM 40 MG/0.4ML ~~LOC~~ SOLN
SUBCUTANEOUS | Status: AC
  Filled 2017-04-14: qty 0.4

## 2017-04-14 MED ORDER — HYDROMORPHONE HCL 1 MG/ML IJ SOLN: 0.2500 mg | INTRAMUSCULAR | Status: DC | PRN

## 2017-04-14 MED ORDER — METFORMIN HCL 500 MG PO TABS
500.0000 mg | ORAL_TABLET | Freq: Two times a day (BID) | ORAL | Status: DC
  Administered 2017-04-14 – 2017-04-15 (×2): 500 mg via ORAL
  Filled 2017-04-14 (×2): qty 1

## 2017-04-14 MED ORDER — VITAMIN C 500 MG PO TABS
1000.0000 mg | ORAL_TABLET | Freq: Every day | ORAL | Status: DC
  Administered 2017-04-14 – 2017-04-15 (×2): 1000 mg via ORAL
  Filled 2017-04-14 (×2): qty 2

## 2017-04-14 MED ORDER — GLYCOPYRROLATE 0.2 MG/ML IJ SOLN
0.2000 mg | Freq: Once | INTRAMUSCULAR | Status: AC
  Administered 2017-04-14: 0.2 mg via INTRAVENOUS

## 2017-04-14 MED ORDER — ASPIRIN EC 325 MG PO TBEC
325.0000 mg | DELAYED_RELEASE_TABLET | Freq: Every morning | ORAL | Status: DC
  Administered 2017-04-14 – 2017-04-15 (×2): 325 mg via ORAL
  Filled 2017-04-14 (×2): qty 1

## 2017-04-14 MED ORDER — ALVIMOPAN 12 MG PO CAPS
12.0000 mg | ORAL_CAPSULE | ORAL | Status: AC
  Administered 2017-04-14: 12 mg via ORAL

## 2017-04-14 MED ORDER — ALVIMOPAN 12 MG PO CAPS
ORAL_CAPSULE | ORAL | Status: AC
  Filled 2017-04-14: qty 1

## 2017-04-14 MED ORDER — ROCURONIUM BROMIDE 100 MG/10ML IV SOLN
INTRAVENOUS | Status: DC | PRN
  Administered 2017-04-14: 5 mg via INTRAVENOUS

## 2017-04-14 MED ORDER — VITAMIN B-6 50 MG PO TABS
200.0000 mg | ORAL_TABLET | Freq: Every day | ORAL | Status: DC
Start: 2017-04-14 — End: 2017-04-15
  Administered 2017-04-14 – 2017-04-15 (×2): 200 mg via ORAL
  Filled 2017-04-14 (×2): qty 4

## 2017-04-14 MED ORDER — GABAPENTIN 300 MG PO CAPS
300.0000 mg | ORAL_CAPSULE | ORAL | Status: AC
  Administered 2017-04-14: 300 mg via ORAL
  Filled 2017-04-14 (×2): qty 1

## 2017-04-14 MED ORDER — GLYCOPYRROLATE 0.2 MG/ML IJ SOLN
INTRAMUSCULAR | Status: AC
  Filled 2017-04-14: qty 1

## 2017-04-14 MED ORDER — ACETAMINOPHEN 500 MG PO TABS
1000.0000 mg | ORAL_TABLET | ORAL | Status: AC
  Administered 2017-04-14: 1000 mg via ORAL

## 2017-04-14 MED ORDER — ENOXAPARIN SODIUM 40 MG/0.4ML ~~LOC~~ SOLN
40.0000 mg | Freq: Once | SUBCUTANEOUS | Status: AC
  Administered 2017-04-14: 40 mg via SUBCUTANEOUS

## 2017-04-14 MED ORDER — MIDAZOLAM HCL 2 MG/2ML IJ SOLN
INTRAMUSCULAR | Status: AC
  Filled 2017-04-14: qty 2

## 2017-04-14 MED ORDER — CEFOTETAN DISODIUM-DEXTROSE 2-2.08 GM-% IV SOLR
2.0000 g | INTRAVENOUS | Status: DC
  Filled 2017-04-14: qty 50

## 2017-04-14 MED ORDER — DEXAMETHASONE SODIUM PHOSPHATE 4 MG/ML IJ SOLN
INTRAMUSCULAR | Status: AC
  Filled 2017-04-14: qty 1

## 2017-04-14 MED ORDER — LIDOCAINE HCL (PF) 1 % IJ SOLN
INTRAMUSCULAR | Status: DC | PRN
  Administered 2017-04-14: 50 mg

## 2017-04-14 MED ORDER — SUCCINYLCHOLINE CHLORIDE 20 MG/ML IJ SOLN
INTRAMUSCULAR | Status: AC
  Filled 2017-04-14: qty 1

## 2017-04-14 MED ORDER — PHENYLEPHRINE HCL 10 MG/ML IJ SOLN
INTRAMUSCULAR | Status: DC | PRN
  Administered 2017-04-14: 40 ug via INTRAVENOUS

## 2017-04-14 MED ORDER — FENTANYL CITRATE (PF) 250 MCG/5ML IJ SOLN
INTRAMUSCULAR | Status: AC
Start: 2017-04-14 — End: ?
  Filled 2017-04-14: qty 5

## 2017-04-14 MED ORDER — PHENYLEPHRINE 40 MCG/ML (10ML) SYRINGE FOR IV PUSH (FOR BLOOD PRESSURE SUPPORT)
PREFILLED_SYRINGE | INTRAVENOUS | Status: AC
  Filled 2017-04-14: qty 10

## 2017-04-14 MED ORDER — MIDAZOLAM HCL 2 MG/2ML IJ SOLN
1.0000 mg | INTRAMUSCULAR | Status: AC
  Administered 2017-04-14: 2 mg via INTRAVENOUS

## 2017-04-14 MED ORDER — HEPARIN SODIUM (PORCINE) 5000 UNIT/ML IJ SOLN
5000.0000 [IU] | Freq: Three times a day (TID) | INTRAMUSCULAR | Status: DC
  Administered 2017-04-14 – 2017-04-15 (×3): 5000 [IU] via SUBCUTANEOUS
  Filled 2017-04-14 (×3): qty 1

## 2017-04-14 MED ORDER — LIDOCAINE HCL (PF) 1 % IJ SOLN
INTRAMUSCULAR | Status: AC
  Filled 2017-04-14: qty 5

## 2017-04-14 MED ORDER — ACETAMINOPHEN 500 MG PO TABS
ORAL_TABLET | ORAL | Status: AC
  Filled 2017-04-14: qty 2

## 2017-04-14 MED ORDER — SODIUM CHLORIDE 0.45 % IV SOLN
INTRAVENOUS | Status: DC
  Administered 2017-04-14: 15:00:00 via INTRAVENOUS
  Filled 2017-04-14 (×3): qty 1000

## 2017-04-14 MED ORDER — DEXTROSE 50 % IV SOLN
12.5000 g | Freq: Once | INTRAVENOUS | Status: AC
  Administered 2017-04-14: 12.5 g via INTRAVENOUS

## 2017-04-14 MED ORDER — CEFOTETAN DISODIUM-DEXTROSE 2-2.08 GM-% IV SOLR
INTRAVENOUS | Status: AC
  Filled 2017-04-14: qty 50

## 2017-04-14 MED ORDER — PANTOPRAZOLE SODIUM 40 MG PO TBEC
40.0000 mg | DELAYED_RELEASE_TABLET | Freq: Every day | ORAL | Status: DC
  Administered 2017-04-15: 40 mg via ORAL
  Filled 2017-04-14 (×2): qty 1

## 2017-04-14 MED ORDER — DEXAMETHASONE SODIUM PHOSPHATE 4 MG/ML IJ SOLN
4.0000 mg | Freq: Once | INTRAMUSCULAR | Status: AC
  Administered 2017-04-14: 4 mg via INTRAVENOUS

## 2017-04-14 SURGICAL SUPPLY — 20 items
BAG HAMPER (MISCELLANEOUS) ×4 IMPLANT
CLOTH BEACON ORANGE TIMEOUT ST (SAFETY) ×4 IMPLANT
COVER LIGHT HANDLE STERIS (MISCELLANEOUS) ×8 IMPLANT
GLOVE BIO SURGEON STRL SZ 6.5 (GLOVE) ×3 IMPLANT
GLOVE BIO SURGEONS STRL SZ 6.5 (GLOVE) ×1
GLOVE BIOGEL PI IND STRL 6.5 (GLOVE) ×2 IMPLANT
GLOVE BIOGEL PI IND STRL 7.0 (GLOVE) ×2 IMPLANT
GLOVE BIOGEL PI IND STRL 7.5 (GLOVE) ×2 IMPLANT
GLOVE BIOGEL PI INDICATOR 6.5 (GLOVE) ×2
GLOVE BIOGEL PI INDICATOR 7.0 (GLOVE) ×2
GLOVE BIOGEL PI INDICATOR 7.5 (GLOVE) ×2
GOWN STRL REUS W/TWL LRG LVL3 (GOWN DISPOSABLE) ×8 IMPLANT
INST SET MAJOR GENERAL (KITS) ×4 IMPLANT
KIT ROOM TURNOVER APOR (KITS) ×4 IMPLANT
NEEDLE INSUFFLATION 120MM (ENDOMECHANICALS) ×4 IMPLANT
PACK LAP CHOLE LZT030E (CUSTOM PROCEDURE TRAY) ×4 IMPLANT
PAD ARMBOARD 7.5X6 YLW CONV (MISCELLANEOUS) ×4 IMPLANT
SET BASIN LINEN APH (SET/KITS/TRAYS/PACK) ×4 IMPLANT
TROCAR XCEL NON-BLD 5MMX100MML (ENDOMECHANICALS) ×4 IMPLANT
WARMER LAPAROSCOPE (MISCELLANEOUS) ×4 IMPLANT

## 2017-04-14 NOTE — Consult Note (Signed)
]      ROS ] 

## 2017-04-14 NOTE — Progress Notes (Signed)
[facility]   Patient with some rhythm changes on monitor and hypotension after induction. Given cardiac history and changes, will abort the procedure and get cardiology involved to see the patients. Spoke with the family and updated them at this time. Will order labs in PACU. EKG done in OR and appears similar to prior.  Will likely plan to admit for observation and allow cardiology to perform their evaluation.    Curlene Labrum, MD [facility] [address] [place], [place] [address] [phone] (office)

## 2017-04-14 NOTE — Consult Note (Signed)
Cardiology Consultation:   Patient ID: [name] [name]; [id number]; 05-01-1951   Admit date: 04/14/2017 Date of Consult: 04/14/2017  Primary Care Provider: Rory Percy, MD Primary Cardiologist: Dr. Domenic Polite Primary Electrophysiologist:  NA   Patient Profile:   [name] [name] is a 66 y.o. male with a hx of CAD S/P CABG who is being seen today for the evaluation of hypotension and possible EKG changes while undergoing anesthesia for elective Hemicolectomy that was aborted at the request of Dr. Arnoldo Morale  History of Present Illness:   [name] is a patient of Dr. Domenic Polite with history of CAD S/P NSTEMI 10/214 and underwent CABG LIMA-LAD, SVG-D1, SVG-D2, SVG-OM LvEF 50-55%, 04/2013. He last saw Dr. Domenic Polite 10/2016 complaining of some angina. Lexiscan medium defect of mod sverity basal infseptal, basal inf, mid inf/sep and mid inf EF 45%, intermediate risk study. F/U echo Normal LVEF 55-60% with mild LVH was reassuring and medical therapy recommended. Also has HTN,HLD, OSA on CPAP, DM2, GERD, PUD.  Today he was undergoing anesthesia for elective hemicolectomy for a tumor. His BP dropped and  EKG changes so case was aborted and he's admitted to rule out MI.I spoke with Dr. Patsey Berthold who said BP dropped to 60 systolic and  QRS narrowed on telemetry. He also had colonoscopy yest so may have been on the dry side.  Patient alert and denies any recent chest pain, dyspnea, dyspnea on exertion, dizziness or presyncope. 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 Feels fine currently.  Past Medical History:  Diagnosis Date  . Anxiety   . Appendiceal tumor   . Arthritis   . Coronary artery disease cardiologist-  dr Domenic Polite   hx NSTEMI 04-19-2013--- Multivessel CAD /  04-25-2013 s/p post CABG x4,  (LIMA-LAD, SVG-D1, SVG-D2, SVG-OM)  . Essential hypertension, benign   . First degree heart block   . GERD (gastroesophageal reflux disease)   . Gout    as of 04-07-2017-- per pt/ wife  stable  . History of kidney stones   . History of non-ST elevation myocardial infarction (NSTEMI) 04/19/2013   s/p  cabg  . History of peptic ulcer disease dx 1990s  . Hyperlipidemia   . Left ureteral stone   . Nocturia   . Nodule of right lung    per CT 04-04-2017  right lower lobe multiple noodules  . OSA on CPAP    Uses CPAP.  Marland Kitchen Renal cyst, right    per CT 04-04-2017  . Renal insufficiency   . S/P CABG x 4 04/25/2013   LIMA to LAD;  SVG to OD;  SVG to D2;  SVG to OM  . Type 2 diabetes mellitus ([place])    last A1c 6.8 on 01-02-2017  . Wears glasses     Past Surgical History:  Procedure Laterality Date  . CARDIOVASCULAR STRESS TEST  11-13-2016   dr Domenic Polite   Intermediate risk nuclear study w/ medium defect of moderate severity in the basal inferoseptal, basal inferior, mid inferoseptal and mid inferior location (findings consistant w/ prior myocardial infarction)/  mild enlarged LV cavity size;  nuclear stress EF 45%  . CORONARY ARTERY BYPASS GRAFT N/A 04/25/2013   Procedure: CORONARY ARTERY BYPASS GRAFTING (CABG);  Surgeon: [name], MD;  Location: [place];  Service: Open Heart Surgery;  Laterality: N/A;  Coronary artery bypass graft times four, on pump, using left internal mammary artery and right greater saphenous vein via endovein harvest.  . CYSTOSCOPY WITH RETROGRADE PYELOGRAM, URETEROSCOPY AND  STENT PLACEMENT Left 04/08/2017   Procedure: CYSTOSCOPY WITH RETROGRADE PYELOGRAM, URETEROSCOPY,STENT PLACEMENT;  Surgeon: Lucas Mallow, MD;  Location: [facility];  Service: Urology;  Laterality: Left;  . ELBOW BURSA SURGERY Right 03-10-2017     at [facility]   and removal of spur  . INTRAOPERATIVE TRANSESOPHAGEAL ECHOCARDIOGRAM N/A 04/25/2013   Procedure: INTRAOPERATIVE TRANSESOPHAGEAL ECHOCARDIOGRAM;  Surgeon: [name], MD;  Location: [facility] OR;  Service: Open Heart Surgery;  Laterality: N/A;  . KNEE ARTHROSCOPY Left 10/ 2017  approx.    dr Theda Sers  . LEFT HEART  CATHETERIZATION WITH CORONARY ANGIOGRAM N/A 04/20/2013   Procedure: LEFT HEART CATHETERIZATION WITH CORONARY ANGIOGRAM;  Surgeon: [name], MD;  Location: [facility] CATH LAB;  Service: Cardiovascular;  Laterality: N/A;  . NASAL SEPTUM SURGERY  1990s approx  . TONSILLECTOMY  65 (age 40)  . TRANSTHORACIC ECHOCARDIOGRAM  11-17-2016   dr Domenic Polite   mild LVH,  ef 55-60%, LV wall motion & diastolic function indeterminant assessment,  images were inadequate/  mild LAE/  trivial Tr     Home Medications:  Prior to Admission medications   Medication Sig Start Date End Date Taking? Authorizing Provider  Ascorbic Acid (VITAMIN C) 1000 MG tablet Take 1,000 mg by mouth daily.    [provider]  aspirin EC 325 MG EC tablet Take 1 tablet (325 mg total) by mouth daily. Patient taking differently: Take 325 mg by mouth every morning.  04/29/13   Nani Skillern, PA-C  colchicine 0.6 MG tablet Take 0.6 mg by mouth 2 (two) times daily as needed.  07/27/13   [name], MD  escitalopram (LEXAPRO) 10 MG tablet Take 10 mg by mouth at bedtime.     [provider]  HYDROcodone-acetaminophen (NORCO/VICODIN) 5-325 MG tablet Take 1 tablet by mouth every 6 (six) hours as needed for moderate pain.    [provider]  lisinopril (PRINIVIL,ZESTRIL) 10 MG tablet Take 5 mg by mouth every morning.     [provider]  lovastatin (MEVACOR) 20 MG tablet TAKE ONE TABLET BY MOUTH AT BEDTIME, STOP TAKING ATORVASTATIN Patient taking differently: Take 20 mg by mouth at bedtime. TAKE ONE TABLET BY MOUTH AT BEDTIME, STOP TAKING ATORVASTATIN 01/04/17   [name], MD  metFORMIN (GLUCOPHAGE) 500 MG tablet Take 500 mg by mouth 2 (two) times daily with a meal.     [provider]  metoprolol tartrate (LOPRESSOR) 25 MG tablet Take 0.5 tablets (12.5 mg total) by mouth 2 (two) times daily. Patient taking differently: Take 12.5 mg by mouth 2 (two) times daily.  01/04/17   [name], MD  Multiple Vitamin (MULTIVITAMIN WITH MINERALS) TABS tablet Take 1 tablet by mouth daily.    [provider]  Omega-3 Fatty Acids (FISH OIL) 1000 MG CPDR Take 1 capsule by mouth daily.    [provider]  omeprazole (PRILOSEC) 20 MG capsule Take 20 mg by mouth every morning.     [provider]  pyridoxine (B-6) 200 MG tablet Take 200 mg by mouth daily.    [provider]    Inpatient Medications: Scheduled Meds:  Continuous Infusions:  PRN Meds:   Allergies:   No Known Allergies  Social History:   Social History   Social History  . Marital status: Married    Spouse name: N/A  . Number of children: N/A  . Years of education: N/A   Occupational History  . Drives truck [employer]   Social History Main Topics  . Smoking status: Never Smoker  . Smokeless tobacco: Never Used  . Alcohol use No  . Drug use: No  . Sexual activity: Yes    Birth control/ protection: None   Other Topics Concern  . Not on file   Social History Narrative   Lives with wife, does not exercise but part of his job is strenuous. Works in a body-shop part-time, too.   2 of his mother's sisters and 1 brother died suddenly (family was told MI but no autopsy).     Family History:     Family History  Problem Relation Age of Onset  . CAD Mother   . CAD Father   . Lung cancer Father   . Colon cancer Father      ROS:  Please see the history of present illness.  Review of Systems  Constitution: Negative.  HENT: Negative.   Cardiovascular: Negative.   Respiratory: Negative.   Endocrine: Negative.   Hematologic/Lymphatic: Negative.   Musculoskeletal: Negative.   Gastrointestinal: Negative.   Genitourinary: Negative.   Neurological: Negative.     All other ROS reviewed and negative.     Physical Exam/Data:   Vitals:   04/14/17 0830 04/14/17 0845 04/14/17 0900 04/14/17 0915  BP: 113/69 119/66 117/64 117/64  Pulse: 83 75 69 70  Resp: 18  18 16 15   Temp:      TempSrc:      SpO2: 96% 96% 97% 99%  Weight:      Height:        Intake/Output Summary (Last 24 hours) at 04/14/17 0935 Last data filed at 04/14/17 0804  Gross per 24 hour  Intake              900 ml  Output                0 ml  Net              900 ml   Filed Weights   04/14/17 0651  Weight: 209 lb (94.8 kg)   Body mass index is 26.83 kg/m.  General:  Well nourished, well developed, in no acute distress  HEENT: normal Lymph: no adenopathy Neck: no JVD Endocrine:  No thryomegaly Vascular: No carotid bruits; FA pulses 2+ bilaterally without bruits  Cardiac:  normal S1, S2; RRR; no murmur  Lungs:  clear to auscultation bilaterally, no wheezing, rhonchi or rales  Abd: soft, nontender, no hepatomegaly  Ext: no edema Musculoskeletal:  No deformities, BUE and BLE strength normal and equal Skin: warm and dry  Neuro:  CNs 2-12 intact, no focal abnormalities noted Psych:  Normal affect   EKG:  The EKG was personally reviewed and demonstrates:  NSR without acute change. Telemetry:  Telemetry was personally reviewed and demonstrates:  NSR  Relevant CV Studies:  Lexiscan 11/13/16  There was no ST segment deviation noted during stress.  Defect 1: There is a medium defect of moderate severity present in the basal inferoseptal, basal inferior, mid inferoseptal and mid inferior location.  Findings consistent with prior myocardial infarction.  This is an intermediate risk study.  Nuclear stress EF: 45%.   2Decho 11/17/16 Study Conclusions   - Left ventricle: The cavity size was normal. Wall thickness was   increased in a pattern of mild LVH. Systolic function was normal.   The estimated ejection fraction was in the range of 55% to 60%. - Aortic valve: Mildly calcified annulus. Mildly thickened,  mildly   calcified leaflets. Valve area (VTI): 3.22 cm^2. Valve area   (Vmax): 3.3 cm^2. - Left atrium: The atrium was mildly dilated. - Technically adequate  study.    Laboratory Data:  Chemistry  Recent Labs Lab 04/08/17 1028 04/13/17 0711 04/14/17 0813  NA 140 140 135  K 4.5 4.1 3.9  CL  --  103 103  CO2  --  25 24  GLUCOSE 97 107* 118*  BUN  --  17 20  CREATININE  --  1.46* 1.38*  CALCIUM  --  9.7 8.8*  GFRNONAA  --  49* 53*  GFRAA  --  57* >60  ANIONGAP  --  12 8    No results for input(s): PROT, ALBUMIN, AST, ALT, ALKPHOS, BILITOT in the last 168 hours. Hematology  Recent Labs Lab 04/08/17 1028 04/13/17 0711 04/14/17 0813  WBC  --  7.4 7.2  RBC  --  4.26 3.84*  HGB 17.3* 12.8* 11.5*  HCT 51.0 38.1* 34.2*  MCV  --  89.4 89.1  MCH  --  30.0 29.9  MCHC  --  33.6 33.6  RDW  --  14.0 13.7  PLT  --  294 201   Cardiac Enzymes  Recent Labs Lab 04/14/17 0813  TROPONINI <0.03   No results for input(s): TROPIPOC in the last 168 hours.  BNPNo results for input(s): BNP, PROBNP in the last 168 hours.  DDimer No results for input(s): DDIMER in the last 168 hours.  Radiology/Studies:  No results found.  Assessment and Plan:   1. Hypotensive and QRS narrowing on tele per anesthesia while undergoing elective hemicolectomy aborted. Initial troponin negative and EKG without acute change. Await troponins. Hydrate. 2. CAD S/P NSTEMI and CABG 2014 for normal LV function, Recent angina and 11/2016 Lexiscan intermediate risk EF 45% with medium defect-see above. F/u echo with normal LVEF 50-55% treated medically. 3. HTN well controlled on lisinopril and metoprolol 4. HLD on mevacor  5. DM2 6. OSA on CPAP 7. Chronic renal insufficiency crt 1.38   For questions or updates, please contact [place] Please consult [email or web address] for contact info under Cardiology/STEMI.   Signed, Ermalinda Barrios, PA-C  04/14/2017 9:35 AM   The patient was seen and examined, and I agree with the history, physical exam, assessment and plan as documented above, with modifications as noted below. I have also personally reviewed all relevant  documentation, old records, labs, and both radiographic and cardiovascular studies. I have also independently interpreted old and new ECG's.  66 yr old gentleman with h/o CAD and CABG with right hemicolectomy planned today for appendiceal mass (mucocele) given high potential for malignant transformation. He became hypotensive after anesthesia induction and there were reported EKG changes. Procedure was aborted and he has been formally hospitalized.  Nuclear stress test on 11/13/16 which I personally interpreted shows infarction without ischemia, LVEF 45%.  He currently denies chest pain, palpitations, and shortness of breath and says "I feel great".  Initial troponin is normal.  GFR improved from 10 days ago, up to 53 from 32.  ECG shows sinus rhythm with QTc 504 ms and mild left axis deviation. I compared this to prior ECG's.  He underwent a colonoscopy yesterday and hasn't had anything to eat since Sunday. He has been drinking clear liquids and Gatorade.  Recommendations: Current episode may have been due to intravascular volume depletion. Agree with IV hydration. Initial troponin was negative and even if there were a mild elevation, I would expect this  with hypotension at time of anesthesia induction given h/o CAD with CABG. He is symptomatically stable and currently normotensive. If he remains stable, could consider proceeding with right hemicolectomy on 10/5 if deemed feasible by surgery.   Kate Sable, MD, [facility]  04/14/2017 10:17 AM

## 2017-04-14 NOTE — Anesthesia Procedure Notes (Signed)
Procedure Name: Intubation Date/Time: 04/14/2017 7:37 AM Performed by: Raenette Rover Pre-anesthesia Checklist: Patient identified, Emergency Drugs available, Suction available and Patient being monitored Patient Re-evaluated:Patient Re-evaluated prior to induction Oxygen Delivery Method: Circle system utilized Preoxygenation: Pre-oxygenation with 100% oxygen Induction Type: IV induction Ventilation: Mask ventilation without difficulty Laryngoscope Size: Glidescope and 3 Grade View: Grade I Tube type: Oral Tube size: 8.0 mm Number of attempts: 1 Airway Equipment and Method: Stylet and Video-laryngoscopy Placement Confirmation: ETT inserted through vocal cords under direct vision,  positive ETCO2,  breath sounds checked- equal and bilateral and CO2 detector Secured at: 22 cm Tube secured with: Tape Dental Injury: Teeth and Oropharynx as per pre-operative assessment

## 2017-04-14 NOTE — Transfer of Care (Signed)
Immediate Anesthesia Transfer of Care Note  Patient: [name] [name]  Procedure(s) Performed: LAPAROSCOPY DIAGNOSTIC (N/A ) OPEN RIGHT HEMI COLECTOMY (Right )  Patient Location: PACU  Anesthesia Type:General  Level of Consciousness: awake, alert , oriented and patient cooperative  Airway & Oxygen Therapy: Patient Spontanous Breathing and Patient connected to face mask oxygen  Post-op Assessment: Report given to RN and Post -op Vital signs reviewed and stable  Post vital signs: Reviewed and stable  Last Vitals:  Vitals:   04/14/17 0725 04/14/17 0808  BP: (!) 120/58 (!) 119/58  Pulse:  (!) 56  Resp: 10 18  Temp:  36.8 C  SpO2: 96% 98%    Last Pain:  Vitals:   04/14/17 0808  TempSrc:   PainSc: 0-No pain      Patients Stated Pain Goal: 9 (63/33/54 5625)  Complications: No apparent anesthesia complications

## 2017-04-14 NOTE — Anesthesia Preprocedure Evaluation (Signed)
Anesthesia Evaluation  Patient identified by MRN, date of birth, ID band Patient awake    Reviewed: Allergy & Precautions, NPO status , Patient's Chart, lab work & pertinent test results  Airway Mallampati: III  TM Distance: >3 FB Neck ROM: Full    Dental  (+) Teeth Intact, Implants, Dental Advisory Given All teeth non removable implants, advised about chipping and damage possibility, and he accepts.:   Pulmonary sleep apnea and Continuous Positive Airway Pressure Ventilation ,    breath sounds clear to auscultation       Cardiovascular hypertension, Pt. on medications + CAD, + Past MI and + CABG   Rhythm:Regular Rate:Normal     Neuro/Psych PSYCHIATRIC DISORDERS Anxiety    GI/Hepatic GERD  ,  Endo/Other  diabetes, Type 2, Oral Hypoglycemic Agents  Renal/GU Renal InsufficiencyRenal disease     Musculoskeletal   Abdominal   Peds  Hematology   Anesthesia Other Findings   Reproductive/Obstetrics                             Anesthesia Physical Anesthesia Plan  ASA: III  Anesthesia Plan: General   Post-op Pain Management:    Induction: Intravenous  PONV Risk Score and Plan:   Airway Management Planned: Oral ETT and Video Laryngoscope Planned  Additional Equipment:   Intra-op Plan:   Post-operative Plan: Extubation in OR  Informed Consent: I have reviewed the patients History and Physical, chart, labs and discussed the procedure including the risks, benefits and alternatives for the proposed anesthesia with the patient or authorized representative who has indicated his/her understanding and acceptance.   Dental advisory given  Plan Discussed with: CRNA  Anesthesia Plan Comments: (12.5g dextrose added to IV fluid for CBG=83 this am.)        Anesthesia Quick Evaluation

## 2017-04-14 NOTE — Progress Notes (Signed)
Received verbal order from Dr. Constance Haw to change IV fluids to 1/2 NS with 10 mEq of KCl to run at 50 ml/hour. D/C'd the d5 1/2 NS with 10 mEq of KCl at 75/hr. As the pt is diabetic.

## 2017-04-14 NOTE — Progress Notes (Signed)
[facility]   Patient doing well. Ate lunch. Have discussed events. Cardiology recommending no further workup. Discussed surgery Monday and repeat prep.  Patient and family agree.  Will observe over night and check troponin, keep on monitor. Home meds ordered.  Home in the [place], MD [facility] [address] [place], [place] [address] [phone] (office)

## 2017-04-14 NOTE — Interval H&P Note (Signed)
History and Physical Interval Note:  04/14/2017 7:20 AM  [name] [name]  has presented today for surgery, with the diagnosis of appendiceal mass  The various methods of treatment have been discussed with the patient and family. After consideration of risks, benefits and other options for treatment, the patient has consented to  Procedure(s): LAPAROSCOPY DIAGNOSTIC (N/A) OPEN RIGHT HEMI COLECTOMY (Right) as a surgical intervention .  The patient's history has been reviewed, patient examined, no change in status, stable for surgery.  I have reviewed the patient's chart and labs.  Questions were answered to the patient's satisfaction.     Virl Cagey

## 2017-04-14 NOTE — Anesthesia Postprocedure Evaluation (Signed)
Anesthesia Post Note  Patient: [name] [name]  Procedure(s) Performed: CANCELLED PROCEDURE  Patient location during evaluation: Nursing Unit Anesthesia Type: General Level of consciousness: awake and alert, oriented and patient cooperative Pain management: pain level controlled Respiratory status: spontaneous breathing Cardiovascular status: stable Postop Assessment: no apparent nausea or vomiting Anesthetic complications: yes (Patient became hypotensive with EKG changes after induction of anesthesia;  Case cancelled and cardiologist consult obtained; please see note from Cardiology; at this time patient denies pain and says he "feels great"  VSS) Anesthetic complication details: anesthesia complications    Last Vitals:  Vitals:   04/14/17 0915 04/14/17 1000  BP: 117/64 118/61  Pulse: 70 68  Resp: 15 16  Temp:  36.9 C  SpO2: 99% 98%    Last Pain:  Vitals:   04/14/17 1014  TempSrc:   PainSc: 0-No pain                 ADAMS, AMY A

## 2017-04-15 ENCOUNTER — Encounter (HOSPITAL_COMMUNITY)
Admission: RE | Admit: 2017-04-15 | Discharge: 2017-04-15 | Disposition: A | Discharge status: Home / Self Care | Payer: BC Managed Care – PPO | Source: Ambulatory Visit | Attending: General Surgery | Admitting: General Surgery

## 2017-04-15 DIAGNOSIS — D490 Neoplasm of unspecified behavior of digestive system: Secondary | ICD-10-CM | POA: Diagnosis not present

## 2017-04-15 DIAGNOSIS — R1909 Other intra-abdominal and pelvic swelling, mass and lump: Secondary | ICD-10-CM | POA: Insufficient documentation

## 2017-04-15 DIAGNOSIS — Z01812 Encounter for preprocedural laboratory examination: Secondary | ICD-10-CM | POA: Insufficient documentation

## 2017-04-15 LAB — TYPE AND SCREEN
ABO/RH(D): O POS
ANTIBODY SCREEN: NEGATIVE

## 2017-04-15 LAB — TROPONIN I: Troponin I: <0.03 ng/mL (ref <0.03)

## 2017-04-15 MED ORDER — PEG 3350-KCL-NA BICARB-NACL 420 G PO SOLR: 4000.0000 mL | Freq: Once | ORAL | 0 refills | Status: DC

## 2017-04-15 MED ORDER — METRONIDAZOLE 500 MG PO TABS: 500.0000 mg | ORAL_TABLET | ORAL | 0 refills | Status: DC

## 2017-04-15 MED ORDER — NEOMYCIN SULFATE 500 MG PO TABS: 1000.0000 mg | ORAL_TABLET | ORAL | 0 refills | Status: DC

## 2017-04-15 MED ORDER — PEG 3350-KCL-NA BICARB-NACL 420 G PO SOLR: 4000.0000 mL | Freq: Once | ORAL | 0 refills | Status: AC

## 2017-04-15 NOTE — Discharge Instructions (Signed)
Please take the antibiotic bowel preparation at 2pm, 3pm and 10pm on Sunday. Nothing to eat at midnight.  Clears on Sunday with your golytely prep as previous.

## 2017-04-15 NOTE — Discharge Summary (Addendum)
Physician Discharge Summary  Patient ID: [name] [name] MRN: [id number] DOB/AGE: [birth year] 66 y.o.  Admit date: 04/14/2017 Discharge date: 04/15/2017  Admission Diagnoses: Rule out MI, appendiceal tumor   Discharge Diagnoses:  Appendiceal tumor    Discharged Condition: good  Hospital Course: Mr. [name] [name] was brought into the hospital for observation following induction of anesthesia that resulted in some rhythm changes and hypotension. Given his heart history he was extubated, and cardiology saw him. He complained of no chest pain or SOB, and his initial troponin was negative, repeat was negative as well.  Cardiology presumed that the episode was from dehydration and preload issues with induction.  He has had no further issues overnight. He is eating and in good spirits.   Plan for surgery on Monday 10/8.   Consults: cardiology  Significant Diagnostic Studies: None  Treatments: IV hydration  Discharge Exam: Blood pressure 135/62, pulse (!) 50, temperature 98.3 F (36.8 C), temperature source Oral, resp. rate 14, height 6\' 2"  (1.88 m), weight 209 lb (94.8 kg), SpO2 97 %. General appearance: alert, cooperative and no distress Resp: normal work breathing Extremities: no edema, movement intact   Disposition: 01-Home or Self Care  Discharge Instructions    Call MD for:  persistant nausea and vomiting    Complete by:  As directed    Call MD for:  temperature >100.4    Complete by:  As directed    Diet - low sodium heart healthy    Complete by:  As directed    Discharge instructions    Complete by:  As directed    Please do your go lytely and antibiotic prep as prescribed on Sunday. Follow up on Monday for Surgery.     Allergies as of 04/15/2017   No Known Allergies     Medication List    TAKE these medications   aspirin 325 MG EC tablet Take 1 tablet (325 mg total) by mouth daily. What changed:  when to take this   colchicine 0.6 MG tablet Take 0.6 mg by mouth 2  (two) times daily as needed.   escitalopram 10 MG tablet Commonly known as:  LEXAPRO Take 10 mg by mouth at bedtime.   Fish Oil 1000 MG Cpdr Take 1 capsule by mouth daily.   lisinopril 10 MG tablet Commonly known as:  PRINIVIL,ZESTRIL Take 5 mg by mouth every morning.   lovastatin 20 MG tablet Commonly known as:  MEVACOR TAKE ONE TABLET BY MOUTH AT BEDTIME, STOP TAKING ATORVASTATIN What changed:  how much to take  how to take this  when to take this  additional instructions   metFORMIN 500 MG tablet Commonly known as:  GLUCOPHAGE Take 500 mg by mouth 2 (two) times daily with a meal.   metoprolol tartrate 25 MG tablet Commonly known as:  LOPRESSOR Take 0.5 tablets (12.5 mg total) by mouth 2 (two) times daily.   metroNIDAZOLE 500 MG tablet Commonly known as:  FLAGYL Take 1 tablet (500 mg total) by mouth as directed. Take at 2pm, 3pm, and 10 pm on Sunday 10/7   multivitamin with minerals Tabs tablet Take 1 tablet by mouth daily.   neomycin 500 MG tablet Commonly known as:  MYCIFRADIN Take 2 tablets (1,000 mg total) by mouth as directed. Take at 2pm, 3pm, and 10 pm on Sunday 10/7   omeprazole 20 MG capsule Commonly known as:  PRILOSEC Take 20 mg by mouth every morning.   oxybutynin 5 MG tablet Commonly known as:  DITROPAN  Take 5 mg by mouth every 8 (eight) hours as needed. For bladder spasms   polyethylene glycol-electrolytes 420 g solution Commonly known as:  NuLYTELY/GoLYTELY Take 4,000 mLs by mouth once.   pyridoxine 200 MG tablet Commonly known as:  B-6 Take 200 mg by mouth daily.   tamsulosin 0.4 MG Caps capsule Commonly known as:  FLOMAX Take 0.4 mg by mouth daily.   vitamin C 1000 MG tablet Take 1,000 mg by mouth daily.        Signed: Virl Cagey 04/15/2017, 10:38 AM

## 2017-04-15 NOTE — Progress Notes (Signed)
Patient discharged home with personal belongings, IV removed and site intact.  

## 2017-04-16 ENCOUNTER — Encounter (HOSPITAL_COMMUNITY): Payer: Self-pay | Admitting: General Surgery

## 2017-04-16 NOTE — H&P (Signed)
[facility] History and Physical   Reason for Referral: Incidental appendiceal mass  Referring Physician: Dr. [name] is an 66 y.o. male.  HPI: [name] [name] is a pleasant 66 yo who presented to the ED this weekend with complaints of left sided abdominal pain. He had thought he was having pain from constipation, and was seen by his PCP and given miralax.  He started to have liquid stools but the pain continued.  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  He reports having a colonoscopy a few years ago with Dr. Britta Mccreedy and that this was normal. He has had some chronic right sided abdominal pain but felt like this was related to his CABG surgery and muscle strain.   During this episode he had some nausea and vomiting, but is unsure if it was related to his pain.        Past Medical History:  Diagnosis Date  . Coronary atherosclerosis of native coronary artery    Multivessel status post CABG, LVEF 50-55% (LIMA-LAD, SVG-D1, SVG-D2, SVG-OM)  . Essential hypertension, benign   . GERD (gastroesophageal reflux disease)   . Gout   . NSTEMI (non-ST elevated myocardial infarction) [facility])    October 2014  . OSA on CPAP   . PUD (peptic ulcer disease)    Diagnosed in the 1990s  . Type 2 diabetes mellitus ([place])          Past Surgical History:  Procedure Laterality Date  . CORONARY ARTERY BYPASS GRAFT N/A 04/25/2013   Procedure: CORONARY ARTERY BYPASS GRAFTING (CABG);  Surgeon: [name], MD;  Location: [place];  Service: Open Heart Surgery;  Laterality: N/A;  Coronary artery bypass graft times four, on pump, using left internal mammary artery and right greater saphenous vein via endovein harvest.  . INTRAOPERATIVE TRANSESOPHAGEAL ECHOCARDIOGRAM N/A 04/25/2013   Procedure: INTRAOPERATIVE TRANSESOPHAGEAL  ECHOCARDIOGRAM;  Surgeon: [name], MD;  Location: [place];  Service: Open Heart Surgery;  Laterality: N/A;  . LEFT HEART CATHETERIZATION WITH CORONARY ANGIOGRAM N/A 04/20/2013   Procedure: LEFT HEART CATHETERIZATION WITH CORONARY ANGIOGRAM;  Surgeon: [name], MD;  Location: [facility] CATH LAB;  Service: Cardiovascular;  Laterality: N/A;  . NASAL SEPTUM SURGERY  > 20 yr ago         Family History  Problem Relation Age of Onset  . CAD Mother   . CAD Father   . Lung cancer Father   . Colon cancer Father     Social History:  reports that he has never smoked. He has never used smokeless tobacco. He reports that he does not drink alcohol or use drugs.  Allergies: No Known Allergies  Medications: I have reviewed the patient's current medications.  Personally reviewed the imaging studies- 8X5cm mass at the ileocecal region adjacent to appendix, some debride at base, small sub centimeter pulmonary nodule on right posterior lobe   Imaging Results (Last 48 hours)  Ct Abdomen Pelvis Wo Contrast  Result Date: 04/04/2017 CLINICAL DATA:  Left lower quadrant pain 3-5 days. Elbow surgery 3 weeks ago. EXAM: CT ABDOMEN AND PELVIS WITHOUT CONTRAST TECHNIQUE: Multidetector CT imaging of the abdomen and pelvis was performed following the standard protocol without IV contrast. COMPARISON:  None. FINDINGS: Lower chest: 1 cm nodule over the anterior aspect of the right lower lobe. Four- 5 mm  ground-glass nodule over the anterior right lower lobe abutting the fissure. 4 mm nodule over the posterior right lower lobe. Sternotomy wires are present. Hepatobiliary: Within normal. Pancreas: Within normal. Spleen: Within normal. Adrenals/Urinary Tract: Adrenal glands are normal. Kidneys are normal in size. 2.6 cm simple cyst over the upper pole of the right kidney. No renal stones. Mild left-sided hydronephrosis with minimal stranding of the left perinephric fat. There is a 1 cm stone over the  proximal left ureter causing this low-grade obstruction. Right ureter and bladder are normal. Stomach/Bowel: Stomach and small bowel are within normal. Appendix is not clearly identified, although there is a hypodense tubular structure over the right lower quadrant which does appear to arise from the cecal tip likely representing appendiceal origin. This measures 4.7 x 4.8 x 8.3 cm. This may represent a mucocele versus neoplasm of the appendix. There is some high-density material along the dependent portion of this tubular structure which may be due to enhancement, calcification or contrast. Much less likely this could represent Meckel's diverticulum or other mesenteric mass. Remainder the colon is within normal. Vascular/Lymphatic: Within normal. Reproductive: Within normal. Other: Small bilateral inguinal hernias containing only peritoneal fat. Small umbilical hernia containing only peritoneal fat. Musculoskeletal: Degenerative change of the spine and hips. IMPRESSION: 1 cm stone over the left proximal to mid ureter causing low to moderate grade obstruction. 2.6 cm right renal cyst. Hypodense tubular structure over the right lower quadrant measuring 4.7 x 4.8 x 8.3 cm likely appendiceal in origin. This may represent a mucocele of the appendix versus appendiceal carcinoma. Less likely this could be due to Meckel's diverticulum or other mesenteric mass. Recommend surgical consultation. Several nodules over the right lower lobe with the largest measuring 1 cm. Recommend followup CT 3 months. This recommendation follows the consensus statement: Guidelines for Management of Small Pulmonary Nodules Detected on CT Scans: A Statement from the [place] as published in Radiology 2005; 237:395-400. Online at: https://www.arnold.com/. Small bilateral inguinal hernias containing only peritoneal fat. Small umbilical hernia containing only peritoneal fat. Electronically Signed   By:  Marin Olp M.D.   On: 04/04/2017 15:17     ROS:  A comprehensive review of systems was negative except for: Gastrointestinal: positive for abdominal pain, constipation, diarrhea, nausea and vomiting Genitourinary: positive for kidney stone Musculoskeletal: positive for stiff joints and gout  Blood pressure 133/74, pulse 68, temperature 98 F (36.7 C), resp. rate 18, height 6\' 2"  (1.88 m), weight 216 lb (98 kg). Physical Exam  Constitutional: He is oriented to person, place, and time and well-developed, well-nourished, and in no distress.  HENT:  Head: Normocephalic and atraumatic.  Eyes: Pupils are equal, round, and reactive to light.  Cardiovascular: Normal rate and regular rhythm.   Pulmonary/Chest: Effort normal and breath sounds normal.  Sternotomy scar well healed  Abdominal: Soft. Bowel sounds are normal. He exhibits no distension and no mass. There is no tenderness. There is no rebound and no guarding.  Musculoskeletal: Normal range of motion.  Right knee pain  Neurological: He is alert and oriented to person, place, and time.  Some limping on right leg  Skin: Skin is warm and dry.  Psychiatric: Mood, memory, affect and judgment normal.    Assessment/Plan: [name] [name] is a 66 yo male with DM and history of CABG, recent ECHO with EF 55-60%, who presented to ED for left sided abdominal pain and found to have a left sided kidney stone and a right sided mass concerning for appendiceal  mucocele. I have personally reviewed the imaging and guidelines for treatment and discussed with Dr. Arnoldo Morale.  At this time, the differential includes appendiceal mucocele which could be benign or malignant, appendiceal carcinoma, Meckel's diverticulum or duplication cyst.  We will proceed with reasonable susicipion that this is a mucocele with high risk of malignancy given its size.   -Preoperative testing ordered and tumor markers  -Scheduled for Colonoscopy with Dr. Arnoldo Morale 10/2 to assess  remainder of colon and see if synchronous lesion, preop sent to pharmacy -Scheduled for diagnostic laparoscopy to rule out carcinomatosis (CT non contrasted) and then open right hemicolectomy, preop oral antibiotics ordered -Clears on Monday and Tuesday only, NPO midnight  PLAN: I counseled the patient about the indication, risks and benefits of open right colectomy.  He understands there is a chance for bleeding, infection, injury to normal structures, leakage at the anastomosis, and need for secondary interventions, anesthesia reaction, cardiopulmonary issues and other risks not specifically detailed here. I described the expected recovery in the hospital for 4-7 days, the plan for follow-up and the restrictions during the recovery phase.  All questions were answered. Also explained that the full treatment will be dictated by the final pathology.  His and his families questions were answered to their satisfaction. The CT scan was showed to the patient and his family.    [name] [name] 04/06/2017, 11:38 AM

## 2017-04-19 ENCOUNTER — Encounter (HOSPITAL_COMMUNITY)
Admission: RE | Disposition: A | Discharge status: Home / Self Care | Payer: Self-pay | Source: Ambulatory Visit | Attending: General Surgery

## 2017-04-19 ENCOUNTER — Inpatient Hospital Stay (HOSPITAL_COMMUNITY): Payer: BC Managed Care – PPO | Admitting: Anesthesiology

## 2017-04-19 ENCOUNTER — Encounter (HOSPITAL_COMMUNITY): Payer: Self-pay | Admitting: *Deleted

## 2017-04-19 ENCOUNTER — Inpatient Hospital Stay (HOSPITAL_COMMUNITY)
Admission: RE | Admit: 2017-04-19 | Discharge: 2017-04-23 | DRG: 331 | Disposition: A | Discharge status: Home / Self Care | Payer: BC Managed Care – PPO | Source: Ambulatory Visit | Attending: General Surgery | Admitting: General Surgery

## 2017-04-19 DIAGNOSIS — M109 Gout, unspecified: Secondary | ICD-10-CM | POA: Diagnosis present

## 2017-04-19 DIAGNOSIS — Z951 Presence of aortocoronary bypass graft: Secondary | ICD-10-CM | POA: Diagnosis not present

## 2017-04-19 DIAGNOSIS — I10 Essential (primary) hypertension: Secondary | ICD-10-CM | POA: Diagnosis present

## 2017-04-19 DIAGNOSIS — Z800 Family history of malignant neoplasm of digestive organs: Secondary | ICD-10-CM | POA: Diagnosis not present

## 2017-04-19 DIAGNOSIS — D373 Neoplasm of uncertain behavior of appendix: Secondary | ICD-10-CM | POA: Diagnosis not present

## 2017-04-19 DIAGNOSIS — Z23 Encounter for immunization: Secondary | ICD-10-CM | POA: Diagnosis not present

## 2017-04-19 DIAGNOSIS — I252 Old myocardial infarction: Secondary | ICD-10-CM | POA: Diagnosis not present

## 2017-04-19 DIAGNOSIS — D490 Neoplasm of unspecified behavior of digestive system: Principal | ICD-10-CM | POA: Diagnosis present

## 2017-04-19 DIAGNOSIS — Z8249 Family history of ischemic heart disease and other diseases of the circulatory system: Secondary | ICD-10-CM | POA: Diagnosis not present

## 2017-04-19 DIAGNOSIS — Z8711 Personal history of peptic ulcer disease: Secondary | ICD-10-CM | POA: Diagnosis not present

## 2017-04-19 DIAGNOSIS — Z801 Family history of malignant neoplasm of trachea, bronchus and lung: Secondary | ICD-10-CM | POA: Diagnosis not present

## 2017-04-19 DIAGNOSIS — Z7984 Long term (current) use of oral hypoglycemic drugs: Secondary | ICD-10-CM

## 2017-04-19 DIAGNOSIS — N200 Calculus of kidney: Secondary | ICD-10-CM | POA: Diagnosis present

## 2017-04-19 DIAGNOSIS — G4733 Obstructive sleep apnea (adult) (pediatric): Secondary | ICD-10-CM | POA: Diagnosis present

## 2017-04-19 DIAGNOSIS — I2510 Atherosclerotic heart disease of native coronary artery without angina pectoris: Secondary | ICD-10-CM | POA: Diagnosis present

## 2017-04-19 DIAGNOSIS — K219 Gastro-esophageal reflux disease without esophagitis: Secondary | ICD-10-CM | POA: Diagnosis present

## 2017-04-19 DIAGNOSIS — Z5331 Laparoscopic surgical procedure converted to open procedure: Secondary | ICD-10-CM

## 2017-04-19 DIAGNOSIS — K389 Disease of appendix, unspecified: Secondary | ICD-10-CM | POA: Diagnosis present

## 2017-04-19 DIAGNOSIS — E119 Type 2 diabetes mellitus without complications: Secondary | ICD-10-CM | POA: Diagnosis present

## 2017-04-19 HISTORY — PX: LAPAROSCOPIC RIGHT HEMI COLECTOMY: SHX5926

## 2017-04-19 HISTORY — PX: LAPAROSCOPY: SHX197

## 2017-04-19 LAB — GLUCOSE, CAPILLARY
GLUCOSE-CAPILLARY: 98 mg/dL (ref 65–99)
GLUCOSE-CAPILLARY: 94 mg/dL (ref 65–99)
Glucose-Capillary: 170 mg/dL — ABNORMAL HIGH (ref 65–99)
Glucose-Capillary: 150 mg/dL — ABNORMAL HIGH (ref 65–99)
Glucose-Capillary: 117 mg/dL — ABNORMAL HIGH (ref 65–99)

## 2017-04-19 LAB — MRSA PCR SCREENING: MRSA by PCR: NEGATIVE

## 2017-04-19 SURGERY — LAPAROSCOPY, DIAGNOSTIC
Anesthesia: General | Site: Abdomen | Laterality: Right

## 2017-04-19 MED ORDER — GABAPENTIN 300 MG PO CAPS
300.0000 mg | ORAL_CAPSULE | ORAL | Status: AC
  Administered 2017-04-19: 300 mg via ORAL

## 2017-04-19 MED ORDER — INFLUENZA VAC SPLIT QUAD 0.5 ML IM SUSY
0.5000 mL | PREFILLED_SYRINGE | INTRAMUSCULAR | Status: AC
  Administered 2017-04-20: 0.5 mL via INTRAMUSCULAR
  Filled 2017-04-19: qty 0.5

## 2017-04-19 MED ORDER — CHLORHEXIDINE GLUCONATE CLOTH 2 % EX PADS: 6.0000 | MEDICATED_PAD | Freq: Once | CUTANEOUS | Status: DC

## 2017-04-19 MED ORDER — SIMETHICONE 80 MG PO CHEW: 40.0000 mg | CHEWABLE_TABLET | Freq: Four times a day (QID) | ORAL | Status: DC | PRN

## 2017-04-19 MED ORDER — LIDOCAINE HCL (CARDIAC) 10 MG/ML IV SOLN
INTRAVENOUS | Status: DC | PRN
  Administered 2017-04-19: 40 mg via INTRAVENOUS

## 2017-04-19 MED ORDER — LIDOCAINE HCL (PF) 1 % IJ SOLN
INTRAMUSCULAR | Status: AC
  Filled 2017-04-19: qty 10

## 2017-04-19 MED ORDER — ACETAMINOPHEN 500 MG PO TABS
1000.0000 mg | ORAL_TABLET | Freq: Four times a day (QID) | ORAL | Status: DC
  Administered 2017-04-19 – 2017-04-21 (×6): 1000 mg via ORAL
  Filled 2017-04-19 (×7): qty 2

## 2017-04-19 MED ORDER — MIDAZOLAM HCL 2 MG/2ML IJ SOLN
1.0000 mg | INTRAMUSCULAR | Status: AC
Start: 2017-04-19 — End: 2017-04-19
  Administered 2017-04-19: 2 mg via INTRAVENOUS

## 2017-04-19 MED ORDER — ROCURONIUM BROMIDE 50 MG/5ML IV SOLN
INTRAVENOUS | Status: AC
  Filled 2017-04-19: qty 1

## 2017-04-19 MED ORDER — FENTANYL CITRATE (PF) 250 MCG/5ML IJ SOLN
INTRAMUSCULAR | Status: AC
  Filled 2017-04-19: qty 5

## 2017-04-19 MED ORDER — OXYBUTYNIN CHLORIDE 5 MG PO TABS: 5.0000 mg | ORAL_TABLET | Freq: Three times a day (TID) | ORAL | Status: DC | PRN

## 2017-04-19 MED ORDER — EPHEDRINE SULFATE 50 MG/ML IJ SOLN
INTRAMUSCULAR | Status: DC | PRN
  Administered 2017-04-19: 2.5 mg via INTRAVENOUS
  Administered 2017-04-19: 10 mg via INTRAVENOUS
  Administered 2017-04-19 (×2): 5 mg via INTRAVENOUS

## 2017-04-19 MED ORDER — INSULIN ASPART 100 UNIT/ML ~~LOC~~ SOLN
0.0000 [IU] | Freq: Three times a day (TID) | SUBCUTANEOUS | Status: DC
  Administered 2017-04-19 – 2017-04-20 (×2): 2 [IU] via SUBCUTANEOUS

## 2017-04-19 MED ORDER — POVIDONE-IODINE 10 % EX OINT
TOPICAL_OINTMENT | CUTANEOUS | Status: AC
  Filled 2017-04-19: qty 1

## 2017-04-19 MED ORDER — GABAPENTIN 300 MG PO CAPS
300.0000 mg | ORAL_CAPSULE | Freq: Two times a day (BID) | ORAL | Status: DC
  Administered 2017-04-19 – 2017-04-23 (×8): 300 mg via ORAL
  Filled 2017-04-19 (×3): qty 1
  Filled 2017-04-19: qty 3
  Filled 2017-04-19 (×4): qty 1

## 2017-04-19 MED ORDER — DEXTROSE 5 % IV SOLN
2.0000 g | Freq: Two times a day (BID) | INTRAVENOUS | Status: AC
  Administered 2017-04-19: 2 g via INTRAVENOUS
  Filled 2017-04-19: qty 2

## 2017-04-19 MED ORDER — LACTATED RINGERS IV SOLN
INTRAVENOUS | Status: DC
  Administered 2017-04-19: 09:00:00 via INTRAVENOUS
  Administered 2017-04-19: 1000 mL via INTRAVENOUS
  Administered 2017-04-19: 11:00:00 via INTRAVENOUS

## 2017-04-19 MED ORDER — ROCURONIUM 10MG/ML (10ML) SYRINGE FOR MEDFUSION PUMP - OPTIME
INTRAVENOUS | Status: DC | PRN
  Administered 2017-04-19: 5 mg via INTRAVENOUS
  Administered 2017-04-19: 20 mg via INTRAVENOUS
  Administered 2017-04-19: 10 mg via INTRAVENOUS
  Administered 2017-04-19: 5 mg via INTRAVENOUS
  Administered 2017-04-19: 25 mg via INTRAVENOUS

## 2017-04-19 MED ORDER — ONDANSETRON 4 MG PO TBDP
4.0000 mg | ORAL_TABLET | Freq: Four times a day (QID) | ORAL | Status: DC | PRN
  Filled 2017-04-19: qty 1

## 2017-04-19 MED ORDER — GABAPENTIN 300 MG PO CAPS
ORAL_CAPSULE | ORAL | Status: AC
Start: 2017-04-19 — End: 2017-04-19
  Filled 2017-04-19: qty 1

## 2017-04-19 MED ORDER — ETOMIDATE 2 MG/ML IV SOLN
INTRAVENOUS | Status: AC
  Filled 2017-04-19: qty 10

## 2017-04-19 MED ORDER — 0.9 % SODIUM CHLORIDE (POUR BTL) OPTIME
TOPICAL | Status: DC | PRN
  Administered 2017-04-19 (×2): 1000 mL

## 2017-04-19 MED ORDER — ETOMIDATE 2 MG/ML IV SOLN
INTRAVENOUS | Status: DC | PRN
Start: 2017-04-19 — End: 2017-04-19
  Administered 2017-04-19: 10 mg via INTRAVENOUS

## 2017-04-19 MED ORDER — METOPROLOL TARTRATE 5 MG/5ML IV SOLN: 5.0000 mg | Freq: Four times a day (QID) | INTRAVENOUS | Status: DC | PRN

## 2017-04-19 MED ORDER — SUCCINYLCHOLINE CHLORIDE 20 MG/ML IJ SOLN
INTRAMUSCULAR | Status: AC
  Filled 2017-04-19: qty 1

## 2017-04-19 MED ORDER — TAMSULOSIN HCL 0.4 MG PO CAPS
0.4000 mg | ORAL_CAPSULE | Freq: Every day | ORAL | Status: DC
  Administered 2017-04-19 – 2017-04-22 (×4): 0.4 mg via ORAL
  Filled 2017-04-19 (×4): qty 1

## 2017-04-19 MED ORDER — DEXAMETHASONE SODIUM PHOSPHATE 4 MG/ML IJ SOLN
INTRAMUSCULAR | Status: AC
  Filled 2017-04-19: qty 1

## 2017-04-19 MED ORDER — ESCITALOPRAM OXALATE 10 MG PO TABS
10.0000 mg | ORAL_TABLET | Freq: Every day | ORAL | Status: DC
  Administered 2017-04-19 – 2017-04-22 (×4): 10 mg via ORAL
  Filled 2017-04-19 (×4): qty 1

## 2017-04-19 MED ORDER — OXYCODONE HCL 5 MG PO TABS
5.0000 mg | ORAL_TABLET | ORAL | Status: DC | PRN
  Administered 2017-04-20 (×2): 5 mg via ORAL
  Administered 2017-04-21 (×2): 10 mg via ORAL
  Administered 2017-04-21: 5 mg via ORAL
  Administered 2017-04-21 – 2017-04-23 (×8): 10 mg via ORAL
  Filled 2017-04-19 (×2): qty 2
  Filled 2017-04-19 (×2): qty 1
  Filled 2017-04-19 (×6): qty 2
  Filled 2017-04-19: qty 1
  Filled 2017-04-19 (×2): qty 2

## 2017-04-19 MED ORDER — SUCCINYLCHOLINE 20MG/ML (10ML) SYRINGE FOR MEDFUSION PUMP - OPTIME
INTRAMUSCULAR | Status: DC | PRN
  Administered 2017-04-19: 120 mg via INTRAVENOUS

## 2017-04-19 MED ORDER — GABAPENTIN 300 MG PO CAPS: 300.0000 mg | ORAL_CAPSULE | Freq: Two times a day (BID) | ORAL | Status: DC

## 2017-04-19 MED ORDER — GLYCOPYRROLATE 0.2 MG/ML IJ SOLN
INTRAMUSCULAR | Status: AC
  Filled 2017-04-19: qty 3

## 2017-04-19 MED ORDER — KETOROLAC TROMETHAMINE 30 MG/ML IJ SOLN
15.0000 mg | Freq: Once | INTRAMUSCULAR | Status: AC
  Administered 2017-04-19: 15 mg via INTRAVENOUS
  Filled 2017-04-19: qty 1

## 2017-04-19 MED ORDER — TAMSULOSIN HCL 0.4 MG PO CAPS: 0.4000 mg | ORAL_CAPSULE | Freq: Every day | ORAL | Status: DC

## 2017-04-19 MED ORDER — ONDANSETRON HCL 4 MG/2ML IJ SOLN
4.0000 mg | Freq: Once | INTRAMUSCULAR | Status: AC
  Administered 2017-04-19: 4 mg via INTRAVENOUS

## 2017-04-19 MED ORDER — NEOSTIGMINE METHYLSULFATE 10 MG/10ML IV SOLN
INTRAVENOUS | Status: DC | PRN
  Administered 2017-04-19: 4 mg via INTRAVENOUS

## 2017-04-19 MED ORDER — ORAL CARE MOUTH RINSE
15.0000 mL | Freq: Two times a day (BID) | OROMUCOSAL | Status: DC
  Administered 2017-04-19 – 2017-04-20 (×3): 15 mL via OROMUCOSAL

## 2017-04-19 MED ORDER — PANTOPRAZOLE SODIUM 40 MG IV SOLR
40.0000 mg | Freq: Every day | INTRAVENOUS | Status: DC
  Administered 2017-04-19 – 2017-04-21 (×3): 40 mg via INTRAVENOUS
  Filled 2017-04-19 (×3): qty 40

## 2017-04-19 MED ORDER — ONDANSETRON HCL 4 MG/2ML IJ SOLN
INTRAMUSCULAR | Status: AC
  Filled 2017-04-19: qty 2

## 2017-04-19 MED ORDER — CEFOTETAN DISODIUM-DEXTROSE 2-2.08 GM-% IV SOLR
2.0000 g | INTRAVENOUS | Status: AC
  Administered 2017-04-19: 2 g via INTRAVENOUS

## 2017-04-19 MED ORDER — ACETAMINOPHEN 500 MG PO TABS
1000.0000 mg | ORAL_TABLET | ORAL | Status: AC
  Administered 2017-04-19: 1000 mg via ORAL

## 2017-04-19 MED ORDER — PNEUMOCOCCAL VAC POLYVALENT 25 MCG/0.5ML IJ INJ: 0.5000 mL | INJECTION | INTRAMUSCULAR | Status: DC

## 2017-04-19 MED ORDER — CHLORHEXIDINE GLUCONATE CLOTH 2 % EX PADS
6.0000 | MEDICATED_PAD | Freq: Once | CUTANEOUS | Status: DC
  Administered 2017-04-20: 6 via TOPICAL

## 2017-04-19 MED ORDER — DIPHENHYDRAMINE HCL 12.5 MG/5ML PO ELIX: 12.5000 mg | ORAL_SOLUTION | Freq: Four times a day (QID) | ORAL | Status: DC | PRN

## 2017-04-19 MED ORDER — GLYCOPYRROLATE 0.2 MG/ML IJ SOLN
INTRAMUSCULAR | Status: DC | PRN
  Administered 2017-04-19: .7 mg via INTRAVENOUS

## 2017-04-19 MED ORDER — ONDANSETRON HCL 4 MG/2ML IJ SOLN: 4.0000 mg | Freq: Four times a day (QID) | INTRAMUSCULAR | Status: DC | PRN

## 2017-04-19 MED ORDER — ENOXAPARIN SODIUM 40 MG/0.4ML ~~LOC~~ SOLN
40.0000 mg | SUBCUTANEOUS | Status: DC
  Administered 2017-04-20 – 2017-04-23 (×4): 40 mg via SUBCUTANEOUS
  Filled 2017-04-19 (×4): qty 0.4

## 2017-04-19 MED ORDER — METOPROLOL TARTRATE 25 MG PO TABS
12.5000 mg | ORAL_TABLET | Freq: Two times a day (BID) | ORAL | Status: DC
  Administered 2017-04-20 – 2017-04-23 (×7): 12.5 mg via ORAL
  Filled 2017-04-19 (×7): qty 1

## 2017-04-19 MED ORDER — ENOXAPARIN SODIUM 40 MG/0.4ML ~~LOC~~ SOLN
40.0000 mg | Freq: Once | SUBCUTANEOUS | Status: AC
Start: 2017-04-19 — End: 2017-04-19
  Administered 2017-04-19: 40 mg via SUBCUTANEOUS
  Filled 2017-04-19: qty 0.4

## 2017-04-19 MED ORDER — BUPIVACAINE LIPOSOME 1.3 % IJ SUSP
INTRAMUSCULAR | Status: DC | PRN
  Administered 2017-04-19: 20 mL

## 2017-04-19 MED ORDER — EPHEDRINE SULFATE 50 MG/ML IJ SOLN
INTRAMUSCULAR | Status: AC
  Filled 2017-04-19: qty 1

## 2017-04-19 MED ORDER — CEFOTETAN DISODIUM-DEXTROSE 2-2.08 GM-% IV SOLR
INTRAVENOUS | Status: AC
Start: 2017-04-19 — End: 2017-04-19
  Filled 2017-04-19: qty 50

## 2017-04-19 MED ORDER — ALVIMOPAN 12 MG PO CAPS
ORAL_CAPSULE | ORAL | Status: AC
  Filled 2017-04-19: qty 1

## 2017-04-19 MED ORDER — ACETAMINOPHEN 500 MG PO TABS
1000.0000 mg | ORAL_TABLET | Freq: Four times a day (QID) | ORAL | Status: DC
  Administered 2017-04-19: 1000 mg via ORAL
  Filled 2017-04-19: qty 2

## 2017-04-19 MED ORDER — INSULIN ASPART 100 UNIT/ML ~~LOC~~ SOLN: 0.0000 [IU] | Freq: Every day | SUBCUTANEOUS | Status: DC

## 2017-04-19 MED ORDER — BUPIVACAINE LIPOSOME 1.3 % IJ SUSP
INTRAMUSCULAR | Status: AC
  Filled 2017-04-19: qty 20

## 2017-04-19 MED ORDER — MIDAZOLAM HCL 2 MG/2ML IJ SOLN
INTRAMUSCULAR | Status: AC
  Filled 2017-04-19: qty 2

## 2017-04-19 MED ORDER — MORPHINE SULFATE (PF) 2 MG/ML IV SOLN
2.0000 mg | INTRAVENOUS | Status: DC | PRN
  Administered 2017-04-19 – 2017-04-20 (×3): 2 mg via INTRAVENOUS
  Filled 2017-04-19 (×3): qty 1

## 2017-04-19 MED ORDER — DIPHENHYDRAMINE HCL 50 MG/ML IJ SOLN: 12.5000 mg | Freq: Four times a day (QID) | INTRAMUSCULAR | Status: DC | PRN

## 2017-04-19 MED ORDER — ALVIMOPAN 12 MG PO CAPS
12.0000 mg | ORAL_CAPSULE | Freq: Two times a day (BID) | ORAL | Status: DC
  Administered 2017-04-20 – 2017-04-22 (×5): 12 mg via ORAL
  Filled 2017-04-19 (×5): qty 1

## 2017-04-19 MED ORDER — FENTANYL CITRATE (PF) 100 MCG/2ML IJ SOLN
INTRAMUSCULAR | Status: DC | PRN
  Administered 2017-04-19 (×6): 50 ug via INTRAVENOUS

## 2017-04-19 MED ORDER — DEXAMETHASONE SODIUM PHOSPHATE 4 MG/ML IJ SOLN
4.0000 mg | Freq: Once | INTRAMUSCULAR | Status: AC
  Administered 2017-04-19: 4 mg via INTRAVENOUS

## 2017-04-19 MED ORDER — ACETAMINOPHEN 500 MG PO TABS
ORAL_TABLET | ORAL | Status: AC
  Filled 2017-04-19: qty 2

## 2017-04-19 MED ORDER — HYDROMORPHONE HCL 1 MG/ML IJ SOLN: 0.2500 mg | INTRAMUSCULAR | Status: DC | PRN

## 2017-04-19 MED ORDER — DOCUSATE SODIUM 100 MG PO CAPS
100.0000 mg | ORAL_CAPSULE | Freq: Two times a day (BID) | ORAL | Status: DC
  Administered 2017-04-19 – 2017-04-23 (×9): 100 mg via ORAL
  Filled 2017-04-19 (×9): qty 1

## 2017-04-19 MED ORDER — NEOSTIGMINE METHYLSULFATE 10 MG/10ML IV SOLN
INTRAVENOUS | Status: AC
  Filled 2017-04-19: qty 1

## 2017-04-19 MED ORDER — LACTATED RINGERS IV SOLN
INTRAVENOUS | Status: DC
  Administered 2017-04-19: 1 mL via INTRAVENOUS
  Administered 2017-04-20: 04:00:00 via INTRAVENOUS

## 2017-04-19 MED ORDER — ALVIMOPAN 12 MG PO CAPS
12.0000 mg | ORAL_CAPSULE | ORAL | Status: AC
  Administered 2017-04-19: 12 mg via ORAL

## 2017-04-19 SURGICAL SUPPLY — 55 items
BAG HAMPER (MISCELLANEOUS) ×4 IMPLANT
BLADE 10 SAFETY STRL DISP (BLADE) ×4 IMPLANT
BLADE 15 SAFETY STRL DISP (BLADE) ×4 IMPLANT
BLADE SURG 15 STRL LF DISP TIS (BLADE) ×2 IMPLANT
BLADE SURG 15 STRL SS (BLADE) ×4
CELLS DAT CNTRL 66122 CELL SVR (MISCELLANEOUS) ×2 IMPLANT
CHLORAPREP W/TINT 26ML (MISCELLANEOUS) ×4 IMPLANT
CLOTH BEACON ORANGE TIMEOUT ST (SAFETY) ×4 IMPLANT
COVER LIGHT HANDLE STERIS (MISCELLANEOUS) ×8 IMPLANT
DECANTER SPIKE VIAL GLASS SM (MISCELLANEOUS) ×4 IMPLANT
ELECT BLADE 6 FLAT ULTRCLN (ELECTRODE) ×4 IMPLANT
ELECT REM PT RETURN 9FT ADLT (ELECTROSURGICAL) ×8
ELECTRODE REM PT RTRN 9FT ADLT (ELECTROSURGICAL) ×4 IMPLANT
GAUZE SPONGE 4X4 12PLY STRL (GAUZE/BANDAGES/DRESSINGS) ×4 IMPLANT
GLOVE BIO SURGEON STRL SZ 6.5 (GLOVE) ×6 IMPLANT
GLOVE BIO SURGEONS STRL SZ 6.5 (GLOVE) ×2
GLOVE BIOGEL PI IND STRL 6.5 (GLOVE) ×4 IMPLANT
GLOVE BIOGEL PI IND STRL 7.0 (GLOVE) ×6 IMPLANT
GLOVE BIOGEL PI INDICATOR 6.5 (GLOVE) ×4
GLOVE BIOGEL PI INDICATOR 7.0 (GLOVE) ×6
GOWN STRL REUS W/ TWL LRG LVL3 (GOWN DISPOSABLE) ×4 IMPLANT
GOWN STRL REUS W/TWL LRG LVL3 (GOWN DISPOSABLE) ×16 IMPLANT
HANDLE SUCTION POOLE (INSTRUMENTS) ×2 IMPLANT
INST SET MAJOR GENERAL (KITS) ×4 IMPLANT
KIT ROOM TURNOVER APOR (KITS) ×4 IMPLANT
LIGASURE IMPACT 36 18CM CVD LR (INSTRUMENTS) ×4 IMPLANT
MANIFOLD NEPTUNE II (INSTRUMENTS) ×4 IMPLANT
NEEDLE HYPO 21X1.5 SAFETY (NEEDLE) ×4 IMPLANT
NEEDLE INSUFFLATION 14GA 120MM (NEEDLE) ×4 IMPLANT
NS IRRIG 1000ML POUR BTL (IV SOLUTION) ×8 IMPLANT
PACK LAP CHOLE LZT030E (CUSTOM PROCEDURE TRAY) ×4 IMPLANT
PAD ARMBOARD 7.5X6 YLW CONV (MISCELLANEOUS) ×4 IMPLANT
PENCIL HANDSWITCHING (ELECTRODE) ×4 IMPLANT
RELOAD PROXIMATE 75MM BLUE (ENDOMECHANICALS) ×8 IMPLANT
RTRCTR WOUND ALEXIS 18CM MED (MISCELLANEOUS) ×4
SET BASIN LINEN APH (SET/KITS/TRAYS/PACK) ×4 IMPLANT
SPONGE INTESTINAL PEANUT (DISPOSABLE) ×4 IMPLANT
SPONGE LAP 18X18 X RAY DECT (DISPOSABLE) ×12 IMPLANT
STAPLER GUN LINEAR PROX 60 (STAPLE) ×4 IMPLANT
STAPLER PROXIMATE 75MM BLUE (STAPLE) ×4 IMPLANT
STAPLER VISISTAT (STAPLE) ×4 IMPLANT
SUCTION POOLE HANDLE (INSTRUMENTS) ×4
SUT CHROMIC 2 0 SH (SUTURE) ×4 IMPLANT
SUT MNCRL AB 4-0 PS2 18 (SUTURE) ×4 IMPLANT
SUT PDS AB 0 CTX 60 (SUTURE) ×8 IMPLANT
SUT SILK 3 0 SH CR/8 (SUTURE) ×4 IMPLANT
SUT VICRYL 0 UR6 27IN ABS (SUTURE) ×4 IMPLANT
SYRINGE 10CC LL (SYRINGE) ×4 IMPLANT
SYRINGE 20CC LL (MISCELLANEOUS) ×4 IMPLANT
SYRINGE LUER LOK 1CC (MISCELLANEOUS) ×4 IMPLANT
TAPE PAPER 3X10 WHT MICROPORE (GAUZE/BANDAGES/DRESSINGS) ×4 IMPLANT
TRAY FOLEY CATH SILVER 16FR (SET/KITS/TRAYS/PACK) ×4 IMPLANT
TROCAR XCEL NON-BLD 5MMX100MML (ENDOMECHANICALS) ×4 IMPLANT
WARMER LAPAROSCOPE (MISCELLANEOUS) ×4 IMPLANT
YANKAUER SUCT BULB TIP NO VENT (SUCTIONS) ×4 IMPLANT

## 2017-04-19 NOTE — Anesthesia Postprocedure Evaluation (Signed)
Anesthesia Post Note  Patient: [name] [name]  Procedure(s) Performed: LAPAROSCOPY DIAGNOSTIC (N/A Abdomen) OPEN RIGHT HEMI COLECTOMY (Right Abdomen)  Patient location during evaluation: PACU Anesthesia Type: General Level of consciousness: awake and alert and oriented Pain management: pain level controlled Vital Signs Assessment: post-procedure vital signs reviewed and stable Respiratory status: spontaneous breathing Cardiovascular status: blood pressure returned to baseline and stable Postop Assessment: no apparent nausea or vomiting Anesthetic complications: no     Last Vitals:  Vitals:   04/19/17 1200 04/19/17 1215  BP: 111/63 117/60  Pulse: 65 64  Resp: 18 16  Temp:    SpO2: 100% 100%    Last Pain:  Vitals:   04/19/17 0707  TempSrc: Oral  PainSc: 0-No pain                 Antwaine Boomhower

## 2017-04-19 NOTE — Anesthesia Procedure Notes (Addendum)
Procedure Name: Intubation Date/Time: 04/19/2017 8:51 AM Performed by: Tressie Stalker E Pre-anesthesia Checklist: Patient identified, Patient being monitored, Timeout performed, Emergency Drugs available and Suction available Patient Re-evaluated:Patient Re-evaluated prior to induction Oxygen Delivery Method: Circle system utilized Preoxygenation: Pre-oxygenation with 100% oxygen Induction Type: IV induction Ventilation: Mask ventilation without difficulty Laryngoscope Size: Glidescope and 3 Grade View: Grade I Tube type: Oral Tube size: 8.0 mm Number of attempts: 1 Airway Equipment and Method: Stylet Placement Confirmation: ETT inserted through vocal cords under direct vision,  positive ETCO2 and breath sounds checked- equal and bilateral Secured at: 23 cm Tube secured with: Tape Dental Injury: Teeth and Oropharynx as per pre-operative assessment

## 2017-04-19 NOTE — Anesthesia Procedure Notes (Signed)
Arterial Line Insertion Start/End10/02/2017 8:40 AM Performed by: Lerry Liner  Patient location: Pre-op. Preanesthetic checklist: patient identified, IV checked, site marked, risks and benefits discussed, surgical consent, monitors and equipment checked, pre-op evaluation, timeout performed and anesthesia consent Lidocaine 1% used for infiltration Left, radial was placed Catheter size: 20 G Hand hygiene performed  and maximum sterile barriers used  Allen's test indicative of satisfactory collateral circulation Attempts: 2 Following insertion, dressing applied. Post procedure assessment: normal and unchanged  Patient tolerated the procedure well with no immediate complications.

## 2017-04-19 NOTE — Transfer of Care (Signed)
Immediate Anesthesia Transfer of Care Note  Patient: [name] [name]  Procedure(s) Performed: LAPAROSCOPY DIAGNOSTIC (N/A Abdomen) OPEN RIGHT HEMI COLECTOMY (Right Abdomen)  Patient Location: PACU  Anesthesia Type:General  Level of Consciousness: awake and alert   Airway & Oxygen Therapy: Patient Spontanous Breathing and Patient connected to face mask oxygen  Post-op Assessment: Report given to RN  Post vital signs: Reviewed and stable  Last Vitals:  Vitals:   04/19/17 0820 04/19/17 0825  BP: 119/60 (!) 123/55  Pulse:    Resp: 15 15  Temp:    SpO2: 100% 97%    Last Pain:  Vitals:   04/19/17 0707  TempSrc: Oral  PainSc: 0-No pain      Patients Stated Pain Goal: 9 (04/28/50 0258)  Complications: No apparent anesthesia complications

## 2017-04-19 NOTE — Interval H&P Note (Signed)
History and Physical Interval Note:  04/19/2017 8:18 AM  [name] [name]  has presented today for surgery, with the diagnosis of appendiceal mass  The various methods of treatment have been discussed with the patient and family. After consideration of risks, benefits and other options for treatment, the patient has consented to  Procedure(s) with comments: LAPAROSCOPY DIAGNOSTIC (N/A) - patient knows to arrive at 6:15 OPEN RIGHT HEMI COLECTOMY (Right) as a surgical intervention .  The patient's history has been reviewed, patient examined, no change in status, stable for surgery.  I have reviewed the patient's chart and labs.  Questions were answered to the patient's satisfaction.     Virl Cagey

## 2017-04-19 NOTE — Progress Notes (Signed)
Post Op  In room. No major issues. A line out. BP stable. Pain controlled. Can have ice and sips. SSI ordered for BS control. Home meds ordered.  BP 117/60   Pulse 64   Temp 97.7 F (36.5 C)   Resp 16   Ht 6\' 2"  (1.88 m)   Wt 209 lb (94.8 kg)   SpO2 100%   BMI 26.83 kg/m  NAD Normal work breathing   Curlene Labrum, MD [facility] [address] [place], [place] [address] [phone] (office)

## 2017-04-19 NOTE — Anesthesia Preprocedure Evaluation (Signed)
Anesthesia Evaluation  Patient identified by MRN, date of birth, ID band Patient awake    Reviewed: Allergy & Precautions, NPO status , Patient's Chart, lab work & pertinent test results  Airway Mallampati: III  TM Distance: >3 FB Neck ROM: Full    Dental  (+) Teeth Intact, Implants, Dental Advisory Given All teeth non removable implants, advised about chipping and damage possibility, and he accepts.:   Pulmonary sleep apnea and Continuous Positive Airway Pressure Ventilation ,    breath sounds clear to auscultation       Cardiovascular hypertension, Pt. on medications + CAD, + Past MI and + CABG   Rhythm:Regular Rate:Normal     Neuro/Psych PSYCHIATRIC DISORDERS Anxiety    GI/Hepatic GERD  ,  Endo/Other  diabetes, Type 2, Oral Hypoglycemic Agents  Renal/GU Renal InsufficiencyRenal disease     Musculoskeletal   Abdominal   Peds  Hematology   Anesthesia Other Findings   Reproductive/Obstetrics                             Anesthesia Physical Anesthesia Plan  ASA: III  Anesthesia Plan: General   Post-op Pain Management:    Induction: Intravenous  PONV Risk Score and Plan:   Airway Management Planned: Oral ETT and Video Laryngoscope Planned  Additional Equipment:   Intra-op Plan:   Post-operative Plan: Extubation in OR  Informed Consent: I have reviewed the patients History and Physical, chart, labs and discussed the procedure including the risks, benefits and alternatives for the proposed anesthesia with the patient or authorized representative who has indicated his/her understanding and acceptance.   Dental advisory given  Plan Discussed with: CRNA  Anesthesia Plan Comments:         Anesthesia Quick Evaluation

## 2017-04-19 NOTE — Op Note (Addendum)
[facility] Operative Note  04/19/17   Preoperative Diagnosis: Appendiceal tumor    Postoperative Diagnosis: Same   Procedure(s) Performed: Diagnostic laparoscopy, Open Right hemicolectomy with primary side to side stapled anastomosis    Surgeon: Lanell Matar. Constance Haw, MD   Assistants: Aviva Signs, MD    Anesthesia: General endotracheal   Specimens: Right colon    Estimated Blood Loss: 100cc    Blood Replacement: None    Complications: None   Wound Class: Clean Contaminated    Operative Indications: Mr. Blanchard is a 66 yo with an incidentally found 8X4X4cm mass near his appendix concerning for an appendiceal tumor.  He was scheduled to undergo surgery for this last week, but had hypotension and rhythm changes, prompting the case to be aborted and cardiology to be consulted.  No additional testing was recommended by cardiology, and they felt like the episode was related to a transient event from preload reduction.  The patient was then rescheduled for a diagnostic laparoscopy with plans for an open hemicolectomy pending no evidence of carcinomatosis or spread of disease requiring additional treatment. The patient opted to proceed after a discussion of the risk and benefits.   Findings: Normal small bowel and colon. 8X4X5cm mass at the edge of mesentery/ appendix, intact, no spillage, no ingrowth, calcified features    Procedure: The patient was taken to the operating room and placed supine. General endotracheal anesthesia was induced with great care to ensure that the patient's hemodynamics remained stable to prevent against any further transient cardiac episodes. Intravenous antibiotics were administered per protocol.  An orogastric tube positioned to decompress the stomach, and a foley catheter was placed.  The abdomen was prepared and draped in the usual sterile fashion.  A time out was completed verifying the correct patient, procedure, site, positioning, and  special equipment prior to beginning this procedure.  An incision was made lateral to the umbilicus and a Veress needle was inserted while a towel clip was used for elevation.  Pneumoperitoneum was achieved.  A 46mm trocar was placed in the abdomen and the entire abdomen was inspected. There was no evidence of injury from the Veress needle or port placement. There was no evidence of any carcinomatosis or implants on the peritoneal cavity or omentum or liver. There was some adhesion in the pelvis and right lower quadrant that appeared reactive.  From here a vertical midline incision was made periumbilical.  This was deepened through the subcutaneous tissue with electrocautery and hemostasis was achieved. The linea alba was identified and incised in the peritoneal cavity was entered.   The abdomen was explored and the mass was palpated in the mesentery in the vicinity of the terminal ileum and appendix. There was no evidence of any spread of disease or metastatic disease. The small bowel was inspected and retracted to the left using a moist gauze. The mass had some adhesions to the retroperitoneum and the mesentery was short which limited our ability to mobilize the right colon. The colon was freed from its peritoneal attachments along the avascular line of Toldt from the cecum to the hepatic flexure. This allowed for further mobilization of the mass. With blunt dissection in the retroperitoneum the mass was mobilized with the right colon up into the operative field. A portion of the mesentery was taken with the LigaSure around the medial edge of the mass to allow for further evisceration of the colon and mass. At this time the specimen had been fully mobilized. Approximately 20 cm from  the ileocecal valve and the proximal point of transection was placed and the bowel was divided with the linear cutting stapler. The peritoneum over the mesentery was then scored and electrocautery. The ileocolic artery was  identified and ligated with a LigaSure device. The middle colic was also similarly identified and ligated with the LigaSure device. The distal point of transection on the descending colon was divided with a linear cutting staple load and the remaining mesentery and all nodal tissue was divided and swept with the specimen. The specimen was removed and sent to pathology. The mass remained intact.  Attention was turned to the retroperitoneum in the right ureter was identified and was intact and not been violated and removing the specimen. Hemostasis was checked in the operative field and was adequate. The 2 ends of bowel were checked and found to be viable with excellent blood supply. The proximal and distals segments of bowel were brought into apposition and found to lie comfortably next to each other. The fat was gently cleared from the terminal 2-3 mm of bowel lesions. Enterotomies were made on the antimesenteric corner of the staple line of the ileum and transverse colon and a linear cutting stapler was inserted and fired. Hemostasis was checked on the staple line. The common enterotomy was closed with a TX linear stapler and the staple line was removed with Metzenbaums. The common staple line was then oversewed with 3-0 silk Lembert sutures at the point where the staple lines crossed. An epiploic fat was tacked down with 3-0 silk sutures over the staple line. The anastomosis was checked and found to be intact and widely patent. A 3-0 silk suture was placed in the crotch. The mesenteric defect was closed with a running 2-0 chromic suture. The abdominal cavity was copiously irrigated and hemostasis was confirmed. The anastomosis and bowel were rechecked and noted to be in proper alignment with no twisting of the mesentery. Omentum was draped over the anastomosis. Final inspection revealed acceptable hemostasis. The operative team regowned and gloved at this point. The fascia was closed with a running looped 0 PDS  suture. An skin was closed with staples.   All counts were correct at the end of the case. The patient was awakened from anesthesia and extubate without complication.  The patient went to the PACU in stable condition.   Curlene Labrum, MD [facility] [address] [place], [place] [address] [phone] (office)

## 2017-04-20 ENCOUNTER — Encounter (HOSPITAL_COMMUNITY): Payer: Self-pay | Admitting: General Surgery

## 2017-04-20 LAB — BASIC METABOLIC PANEL
ANION GAP: 8 (ref 5–15)
BUN: 20 mg/dL (ref 6–20)
CHLORIDE: 101 mmol/L (ref 101–111)
CO2: 27 mmol/L (ref 22–32)
CREATININE: 1.37 mg/dL — AB (ref 0.61–1.24)
Calcium: 8.4 mg/dL — ABNORMAL LOW (ref 8.9–10.3)
GFR calc non Af Amer: 53 mL/min — ABNORMAL LOW (ref >60)
Glucose, Bld: 101 mg/dL — ABNORMAL HIGH (ref 65–99)
POTASSIUM: 4.4 mmol/L (ref 3.5–5.1)
SODIUM: 136 mmol/L (ref 135–145)

## 2017-04-20 LAB — PHOSPHORUS: PHOSPHORUS: 4.2 mg/dL (ref 2.5–4.6)

## 2017-04-20 LAB — GLUCOSE, CAPILLARY
GLUCOSE-CAPILLARY: 118 mg/dL — AB (ref 65–99)
Glucose-Capillary: 95 mg/dL (ref 65–99)
Glucose-Capillary: 146 mg/dL — ABNORMAL HIGH (ref 65–99)
Glucose-Capillary: 110 mg/dL — ABNORMAL HIGH (ref 65–99)

## 2017-04-20 LAB — CBC
HCT: 31.6 % — ABNORMAL LOW (ref 39.0–52.0)
HEMOGLOBIN: 10.6 g/dL — AB (ref 13.0–17.0)
MCH: 30.5 pg (ref 26.0–34.0)
MCHC: 33.5 g/dL (ref 30.0–36.0)
MCV: 90.8 fL (ref 78.0–100.0)
Platelets: 206 10*3/uL (ref 150–400)
RBC: 3.48 MIL/uL — AB (ref 4.22–5.81)
RDW: 14.6 % (ref 11.5–15.5)
WBC: 9.6 10*3/uL (ref 4.0–10.5)

## 2017-04-20 LAB — MAGNESIUM: MAGNESIUM: 1.7 mg/dL (ref 1.7–2.4)

## 2017-04-20 MED ORDER — POTASSIUM CHLORIDE IN NACL 20-0.45 MEQ/L-% IV SOLN
INTRAVENOUS | Status: DC
  Administered 2017-04-20: 13:00:00 via INTRAVENOUS
  Filled 2017-04-20 (×3): qty 1000

## 2017-04-20 NOTE — Progress Notes (Signed)
[facility] Progress Note  1 Day Post-Op  Subjective: Doing well. Had some pain overnight and needed morphine. No nausea or vomiting. No bloating. No flatus, tolerated sips.   Objective: Vital signs in last 24 hours: Temp:  [97.6 F (36.4 C)-98.2 F (36.8 C)] 98.2 F (36.8 C) (10/09 0800) Pulse Rate:  [48-67] 58 (10/09 1000) Resp:  [7-20] 19 (10/09 1000) BP: (93-130)/(44-68) 114/60 (10/09 1000) SpO2:  [94 %-100 %] 94 % (10/09 1000) Last BM Date: 04/19/17  Intake/Output from previous day: 10/08 0701 - 10/09 0700 In: 4358.8 [P.O.:700; I.V.:3658.8] Out: 1510 [Urine:1410; Blood:100] Intake/Output this shift: Total I/O In: -  Out: 375 [Urine:375]  General appearance: alert, cooperative and no distress Resp: normal work breathing GI: soft, nondistended, appropriately tender, dressing in place c/ d/i Extremities: extremities normal, atraumatic, no cyanosis or edema  Lab Results:   Recent Labs  04/20/17 0408  WBC 9.6  HGB 10.6*  HCT 31.6*  PLT 206   BMET  Recent Labs  04/20/17 0408  NA 136  K 4.4  CL 101  CO2 27  GLUCOSE 101*  BUN 20  CREATININE 1.37*  CALCIUM 8.4*   PT/INR No results for input(s): LABPROT, INR in the last 72 hours.  Studies/Results: None    Assessment/Plan: Mr. Seales POD 1 s/p Right hemicolectomy for appendiceal tumor, pathology pending. Doing fair.  -PRN For pain -IS, OOB, ambulate -Clears, SSI, entereg, awaiting bowel function  -Home meds ordered  -Lytes replaced, IVF changed to maintenance  -Foley out -Scds, lovenox    LOS: 1 day    Virl Cagey 04/20/2017

## 2017-04-20 NOTE — Care Management Note (Signed)
Case Management Note  Patient Details  Name: [name] [name] MRN: [id number] Date of Birth: 06-25-1951  Subjective/Objective:     S/p Right hemicolectomy for appendiceal tumor, pathology pending. From home with wife. Ind with ADL's pta. Has PCP, still drives to appointments, reports no issues affording medications. Does not feel he will need to have home health at time of discharge.   Action/Plan:Anticipate DC home with self care. No CM needs known.   Expected Discharge Date:  04/24/17               Expected Discharge Plan:  Home/Self Care  In-House Referral:     Discharge planning Services  CM Consult  Post Acute Care Choice:    Choice offered to:     DME Arranged:    DME Agency:     HH Arranged:    HH Agency:     Status of Service:  In process, will continue to follow  If discussed at Long Length of Stay Meetings, dates discussed:    Additional Comments:  Edmundo Tedesco, Chauncey Reading, RN 04/20/2017, 2:23 PM

## 2017-04-20 NOTE — Anesthesia Postprocedure Evaluation (Signed)
Anesthesia Post Note  Patient: [name] [name]  Procedure(s) Performed: LAPAROSCOPY DIAGNOSTIC (N/A Abdomen) OPEN RIGHT HEMI COLECTOMY (Right Abdomen)  Patient location during evaluation: ICU Anesthesia Type: General Level of consciousness: awake and alert and oriented Pain management: pain level controlled Vital Signs Assessment: post-procedure vital signs reviewed and stable Respiratory status: spontaneous breathing Cardiovascular status: blood pressure returned to baseline and stable Postop Assessment: no apparent nausea or vomiting Anesthetic complications: no     Last Vitals:  Vitals:   04/20/17 0938 04/20/17 1000  BP:  114/60  Pulse: 64 (!) 58  Resp:  19  Temp:    SpO2:  94%    Last Pain:  Vitals:   04/20/17 0905  TempSrc:   PainSc: 3                  Deetra Booton

## 2017-04-20 NOTE — Addendum Note (Signed)
Addendum  created 04/20/17 1044 by Ollen Bowl, CRNA   Sign clinical note

## 2017-04-21 LAB — BASIC METABOLIC PANEL
Anion gap: 6 (ref 5–15)
BUN: 13 mg/dL (ref 6–20)
CHLORIDE: 103 mmol/L (ref 101–111)
CO2: 29 mmol/L (ref 22–32)
Calcium: 8.5 mg/dL — ABNORMAL LOW (ref 8.9–10.3)
Creatinine, Ser: 1.2 mg/dL (ref 0.61–1.24)
GFR calc Af Amer: >60 mL/min (ref >60)
GFR calc non Af Amer: >60 mL/min (ref >60)
Glucose, Bld: 99 mg/dL (ref 65–99)
POTASSIUM: 4.6 mmol/L (ref 3.5–5.1)
SODIUM: 138 mmol/L (ref 135–145)

## 2017-04-21 LAB — CBC
HCT: 32.9 % — ABNORMAL LOW (ref 39.0–52.0)
HEMOGLOBIN: 10.7 g/dL — AB (ref 13.0–17.0)
MCH: 30.1 pg (ref 26.0–34.0)
MCHC: 32.5 g/dL (ref 30.0–36.0)
MCV: 92.4 fL (ref 78.0–100.0)
Platelets: 191 10*3/uL (ref 150–400)
RBC: 3.56 MIL/uL — AB (ref 4.22–5.81)
RDW: 14.7 % (ref 11.5–15.5)
WBC: 8.2 10*3/uL (ref 4.0–10.5)

## 2017-04-21 LAB — PHOSPHORUS: Phosphorus: 2.8 mg/dL (ref 2.5–4.6)

## 2017-04-21 LAB — GLUCOSE, CAPILLARY: Glucose-Capillary: 93 mg/dL (ref 65–99)

## 2017-04-21 LAB — MAGNESIUM: MAGNESIUM: 1.9 mg/dL (ref 1.7–2.4)

## 2017-04-21 MED ORDER — ACETAMINOPHEN 500 MG PO TABS: 1000.0000 mg | ORAL_TABLET | Freq: Four times a day (QID) | ORAL | Status: DC | PRN

## 2017-04-21 NOTE — Plan of Care (Signed)
Problem: Safety: Goal: Ability to remain free from injury will improve Outcome: Progressing Patient able to ambulate with minimal assist.

## 2017-04-21 NOTE — Progress Notes (Signed)
[facility] Progress Note  2 Days Post-Op  Subjective: No major issues. Having BMs and ambulating. Tolerating clears. Feel well this AM.   Objective: Vital signs in last 24 hours: Temp:  [98.4 F (36.9 C)-98.7 F (37.1 C)] 98.7 F (37.1 C) (10/10 0937) Pulse Rate:  [71-73] 72 (10/09 2207) Resp:  [18] 18 (10/09 1643) BP: (126-144)/(66-74) 144/66 (10/10 0937) SpO2:  [95 %-98 %] 98 % (10/09 2000) FiO2 (%):  [21 %] 21 % (10/09 2000) Last BM Date: 04/19/17  Intake/Output from previous day: 10/09 0701 - 10/10 0700 In: 519.2 [I.V.:519.2] Out: 2175 [Urine:1525; Stool:650] Intake/Output this shift: Total I/O In: -  Out: 500 [Stool:500]  General appearance: alert, cooperative and no distress Resp: normal work breathing GI: soft, appropriately tender, minimally distended, incision c/d/i with staples, no erythema or drainage Extremities: extremities normal, atraumatic, no cyanosis or edema  Lab Results:   Recent Labs  04/20/17 0408 04/21/17 0445  WBC 9.6 8.2  HGB 10.6* 10.7*  HCT 31.6* 32.9*  PLT 206 191   BMET  Recent Labs  04/20/17 0408 04/21/17 0445  NA 136 138  K 4.4 4.6  CL 101 103  CO2 27 29  GLUCOSE 101* 99  BUN 20 13  CREATININE 1.37* 1.20  CALCIUM 8.4* 8.5*   PT/INR No results for input(s): LABPROT, INR in the last 72 hours.  Studies/Results: No results found.  Anti-infectives: Anti-infectives    Start     Dose/Rate Route Frequency Ordered Stop   04/19/17 2000  cefoTEtan (CEFOTAN) 2 g in dextrose 5 % 50 mL IVPB     2 g 100 mL/hr over 30 Minutes Intravenous Every 12 hours 04/19/17 1317 04/19/17 2005   04/19/17 0626  cefoTEtan in Dextrose 5% (CEFOTAN) IVPB 2 g     2 g Intravenous On call to O.R. 04/19/17 0626 04/19/17 0853      Assessment/Plan: Mr. 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 Pathology pending. Doing great. -PRN for pain, changing tylenol to PRN -IS, OOB -HD  wnl, home meds -Carb controlled/ heart healthy diet, go slow -UOP adequate, d/c IVF, lytes good  -May shower -Will hold on further labs at this time -SCDS, lovenox    LOS: 2 days    Virl Cagey 04/21/2017

## 2017-04-22 MED ORDER — IBUPROFEN 800 MG PO TABS: 800.0000 mg | ORAL_TABLET | Freq: Four times a day (QID) | ORAL | Status: DC | PRN

## 2017-04-22 MED ORDER — COLCHICINE 0.6 MG PO TABS
0.6000 mg | ORAL_TABLET | Freq: Two times a day (BID) | ORAL | Status: DC
  Administered 2017-04-22 – 2017-04-23 (×3): 0.6 mg via ORAL
  Filled 2017-04-22 (×3): qty 1

## 2017-04-22 MED ORDER — PANTOPRAZOLE SODIUM 40 MG PO TBEC
40.0000 mg | DELAYED_RELEASE_TABLET | Freq: Every day | ORAL | Status: DC
  Administered 2017-04-22: 40 mg via ORAL
  Filled 2017-04-22: qty 1

## 2017-04-22 NOTE — Progress Notes (Signed)
PHARMACIST - PHYSICIAN COMMUNICATION  DR:   Constance Haw  CONCERNING: IV to Oral Route Change Policy  RECOMMENDATION: This patient is receiving Protonix by the intravenous route.  Based on criteria approved by the Pharmacy and Therapeutics Committee, the intravenous medication(s) is/are being converted to the equivalent oral dose form(s).   DESCRIPTION: These criteria include:  The patient is eating (either orally or via tube) and/or has been taking other orally administered medications for a least 24 hours  The patient has no evidence of active gastrointestinal bleeding or impaired GI absorption (gastrectomy, short bowel, patient on TNA or NPO).  If you have questions about this conversion, please contact the Pharmacy Department  []   [phone] )  [facility] []   [phone] )  [facility] []   [phone] )  [facility] []   [phone] )  [facility] []   [phone] )  [place], [facility] 04/22/2017 10:45 AM

## 2017-04-22 NOTE — Progress Notes (Signed)
[facility] Progress Note  3 Days Post-Op  Subjective: Saw patient this AM. Doing well. Eating diet and having Bms. Complaining of knee pain and thinks his gout is flaring up.  He has not been able to ambulate well.  Otherwise feeling good. Abd pain controlled.   Objective: Vital signs in last 24 hours: Temp:  [98.6 F (37 C)-99.4 F (37.4 C)] 99.4 F (37.4 C) (10/11 1446) Pulse Rate:  [70-85] 78 (10/11 1446) Resp:  [18-20] 19 (10/11 1446) BP: (113-131)/(61-69) 131/61 (10/11 1446) SpO2:  [94 %-97 %] 94 % (10/11 1446) Last BM Date: 04/21/17  Intake/Output from previous day: 10/10 0701 - 10/11 0700 In: 480 [P.O.:480] Out: 500 [Stool:500] Intake/Output this shift: No intake/output data recorded.  General appearance: alert, cooperative and no distress Resp: normal work breathing GI: soft, minimally tender, staples intact no erythema or drainage Extremities: warm, no edema  Lab Results:   Recent Labs  04/20/17 0408 04/21/17 0445  WBC 9.6 8.2  HGB 10.6* 10.7*  HCT 31.6* 32.9*  PLT 206 191   BMET  Recent Labs  04/20/17 0408 04/21/17 0445  NA 136 138  K 4.4 4.6  CL 101 103  CO2 27 29  GLUCOSE 101* 99  BUN 20 13  CREATININE 1.37* 1.20  CALCIUM 8.4* 8.5*   PT/INR No results for input(s): LABPROT, INR in the last 72 hours.  Studies/Results: No results found.  Anti-infectives: Anti-infectives    Start     Dose/Rate Route Frequency Ordered Stop   04/19/17 2000  cefoTEtan (CEFOTAN) 2 g in dextrose 5 % 50 mL IVPB     2 g 100 mL/hr over 30 Minutes Intravenous Every 12 hours 04/19/17 1317 04/19/17 2005   04/19/17 0626  cefoTEtan in Dextrose 5% (CEFOTAN) IVPB 2 g     2 g Intravenous On call to O.R. 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 Doing well but having knee pain from gout flare.  -PRN for pain, added ibuprofen -IS,  OOB -HD ok, home meds -Diet as tolerated, having BMs -Afebrile, No labs -UOP adequate, not all recorded, but voiding -Colchicine ordered for gout -SCDs, lovenox, ambulate  -Likely home tomorrow    LOS: 3 days    Virl Cagey 04/22/2017

## 2017-04-23 MED ORDER — DOCUSATE SODIUM 100 MG PO CAPS: 100.0000 mg | ORAL_CAPSULE | Freq: Two times a day (BID) | ORAL | 0 refills | Status: DC

## 2017-04-23 MED ORDER — OXYCODONE HCL 5 MG PO TABS: 5.0000 mg | ORAL_TABLET | ORAL | 0 refills | Status: DC | PRN

## 2017-04-23 MED ORDER — ACETAMINOPHEN 500 MG PO TABS: 1000.0000 mg | ORAL_TABLET | Freq: Four times a day (QID) | ORAL | 0 refills | Status: DC | PRN

## 2017-04-23 MED ORDER — IBUPROFEN 800 MG PO TABS: 800.0000 mg | ORAL_TABLET | Freq: Four times a day (QID) | ORAL | 0 refills | Status: DC | PRN

## 2017-04-23 NOTE — Discharge Summary (Addendum)
Physician Discharge Summary  Patient ID: [name] [name] MRN: [id number] DOB/AGE: 02-27-1951 66 y.o.  Admit date: 04/19/2017 Discharge date: 04/23/2017  Admission Diagnoses:  Discharge Diagnoses:  Active Problems:   Appendiceal tumor   Discharged Condition: good  Pathology:  Colon, segmental resection for tumor, right - LOW GRADE APPENDICEAL MUCINOUS NEOPLASM. - EPITHELIUM IS LIMITED TO APPENDIX LUMEN. - ACELLULAR MUCIN FOCALLY EXTENDS INTO SUBSEROSA. - RESECTION MARGINS ARE NEGATIVE FOR TUMOR OR MUCIN. - TWENTY OF TWENTY LYMPH NODE NEGATIVE FOR TUMOR (0/20). - SEE ONCOLOGY TABLE.  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  He underwent a exploratory laparotomy with right hemicolectomy and primary anastomosis.  He did well post operatively. He started to have BMs on day 2, and was tolerating a clear to regular diet.  His pain was controlled with oral pain medications, and his biggest issue was a gout flare in his knees. His colchsine was ordered, and he had some improvement prior to his discharge home.    Of note, he did have an incidentally found lung nodule on CT of the abdomen, and needs to have this followed up. Will discuss with his PCP.   Consults: None  Significant Diagnostic Studies: None  Treatments: surgery: Right hemicolectomy  Discharge Exam: Blood pressure 127/73, pulse 67, temperature 98.8 F (37.1 C), temperature source Oral, resp. rate 19, height 6\' 2"  (1.88 m), weight 209 lb (94.8 kg), SpO2 100 %. General appearance: alert, cooperative and no distress Resp: normal work breathing GI: soft, nontender, staples, c/d/i intact no erythema or drainage Extremities: extremities normal, atraumatic, no cyanosis or edema; knees with some swelling  Disposition: 01-Home or Self Care  Discharge Instructions    Call MD for:  difficulty breathing, headache or visual disturbances    Complete  by:  As directed    Call MD for:  hives    Complete by:  As directed    Call MD for:  persistant dizziness or light-headedness    Complete by:  As directed    Call MD for:  persistant nausea and vomiting    Complete by:  As directed    Call MD for:  redness, tenderness, or signs of infection (pain, swelling, redness, odor or green/yellow discharge around incision site)    Complete by:  As directed    Call MD for:  severe uncontrolled pain    Complete by:  As directed    Call MD for:  temperature >100.4    Complete by:  As directed    Diet - low sodium heart healthy    Complete by:  As directed    Increase activity slowly    Complete by:  As directed      Allergies as of 04/23/2017   No Known Allergies     Medication List    STOP taking these medications   metroNIDAZOLE 500 MG tablet Commonly known as:  FLAGYL   neomycin 500 MG tablet Commonly known as:  MYCIFRADIN     TAKE these medications   acetaminophen 500 MG tablet Commonly known as:  TYLENOL Take 2 tablets (1,000 mg total) by mouth every 6 (six) hours as needed for mild pain.   aspirin 325 MG EC tablet Take 1 tablet (325 mg total) by mouth daily. What changed:  when to take this   colchicine 0.6 MG tablet Take 0.6 mg by mouth 2 (two) times daily as needed.   docusate  sodium 100 MG capsule Commonly known as:  COLACE Take 1 capsule (100 mg total) by mouth 2 (two) times daily.   escitalopram 10 MG tablet Commonly known as:  LEXAPRO Take 10 mg by mouth at bedtime.   Fish Oil 1000 MG Cpdr Take 1 capsule by mouth daily.   ibuprofen 800 MG tablet Commonly known as:  ADVIL,MOTRIN Take 1 tablet (800 mg total) by mouth every 6 (six) hours as needed for headache or moderate pain.   lisinopril 10 MG tablet Commonly known as:  PRINIVIL,ZESTRIL Take 5 mg by mouth every morning.   lovastatin 20 MG tablet Commonly known as:  MEVACOR TAKE ONE TABLET BY MOUTH AT BEDTIME, STOP TAKING ATORVASTATIN What  changed:  how much to take  how to take this  when to take this  additional instructions   metoprolol tartrate 25 MG tablet Commonly known as:  LOPRESSOR Take 0.5 tablets (12.5 mg total) by mouth 2 (two) times daily.   multivitamin with minerals Tabs tablet Take 1 tablet by mouth daily.   omeprazole 20 MG capsule Commonly known as:  PRILOSEC Take 20 mg by mouth every morning.   oxybutynin 5 MG tablet Commonly known as:  DITROPAN Take 5 mg by mouth every 8 (eight) hours as needed. For bladder spasms   oxyCODONE 5 MG immediate release tablet Commonly known as:  Oxy IR/ROXICODONE Take 1 tablet (5 mg total) by mouth every 4 (four) hours as needed for moderate pain or severe pain.   pyridoxine 200 MG tablet Commonly known as:  B-6 Take 200 mg by mouth daily.   tamsulosin 0.4 MG Caps capsule Commonly known as:  FLOMAX Take 0.4 mg by mouth daily.   vitamin C 1000 MG tablet Take 1,000 mg by mouth daily.      Follow-up Information    Virl Cagey, MD Follow up in 1 week(s).   Specialty:  General Surgery Contact information: [address] [place] [address] [phone]           Signed: Virl Cagey 04/23/2017, 2:59 PM

## 2017-04-23 NOTE — Discharge Instructions (Signed)
Discharge Instructions: Shower per your regular routine. Take tylenol and ibuprofen as needed for pain control, alternating every 4-6 hours.  Take Roxicodone for breakthrough pain. Take colace for constipation related to narcotic pain medication. Do not pick at the staples on your incision sites.  Where a binder with ambulation.    Open Colectomy, Care After This sheet gives you information about how to care for yourself after your procedure. Your health care provider may also give you more specific instructions. If you have problems or questions, contact your health care provider. What can I expect after the procedure? After the procedure, it is common to have:  Pain in your abdomen, especially along your incision.  Tiredness. Your energy level will return to normal over the next several weeks.  Constipation.  Nausea.  Difficulty urinating.  Follow these instructions at home: Activity  You may be able to return to most of your normal activities within 1-2 weeks, such as working, walking up stairs, and sexual activity.  Avoid activities that require a lot of energy for 4-6 weeks after surgery, such as running, climbing, and lifting heavy objects. Ask your health care provider what activities are safe for you.  Take rest breaks during the day as needed.  Do not drive for 1-2 weeks or until your health care provider says that it is safe.  Do not drive or use heavy machinery while taking prescription pain medicines.  Do not lift anything that is heavier than 10 lb (4.3 kg) until your health care provider says that it is safe. Incision care  Follow instructions from your health care provider about how to take care of your incision. Make sure you: ? Wash your hands with soap and water before you change your bandage (dressing). If soap and water are not available, use hand sanitizer. ? Change your dressing as told by your health care provider. ? Leave stitches (sutures) or  staples in place. These skin closures may need to stay in place for 2 weeks or longer.  Avoid wearing tight clothing around your incision.  Protect your incision area from the sun.  Check your incision area every day for signs of infection. Check for: ? More redness, swelling, or pain. ? More fluid or blood. ? Warmth. ? Pus or a bad smell. General instructions  Do not take baths, swim, or use a hot tub until your health care provider approves. Ask your health care provider when you may shower.  Take over-the-counter and prescription medicines, including stool softeners, only as told by your health care provider.  Eat a low-fat and low-fiber diet for the first 4 weeks after surgery.  Keep all follow-up visits as told by your health care provider. This is important. Contact a health care provider if:  You have more redness, swelling, or pain around your incision.  You have more fluid or blood coming from your incision.  Your incision feels warm to the touch.  You have pus or a bad smell coming from your incision.  You have a fever or chills.  You do not have a bowel movement 2-3 days after surgery.  You cannot eat or drink for 24 hours or more.  You have persistent nausea and vomiting.  You have abdominal pain that gets worse and does not get better with medicine. Get help right away if:  You have chest pain.  You have shortness of breath.  You have pain or swelling in your legs.  Your incision breaks open after your  sutures or staples have been removed.  You have bleeding from the rectum. This information is not intended to replace advice given to you by your health care provider. Make sure you discuss any questions you have with your health care provider. Document Released: 01/20/2011 Document Revised: 03/30/2016 Document Reviewed: 03/30/2016 Elsevier Interactive Patient Education  [employer].

## 2017-04-29 ENCOUNTER — Encounter: Payer: Self-pay | Admitting: General Surgery

## 2017-04-29 ENCOUNTER — Ambulatory Visit (INDEPENDENT_AMBULATORY_CARE_PROVIDER_SITE_OTHER): Payer: Self-pay | Admitting: General Surgery

## 2017-04-29 VITALS — BP 111/54 | Temp 98.2°F | Resp 18 | Ht 74.0 in | Wt 205.0 lb

## 2017-04-29 DIAGNOSIS — D373 Neoplasm of uncertain behavior of appendix: Secondary | ICD-10-CM

## 2017-04-29 NOTE — Progress Notes (Signed)
[facility] Note   HPI:  66 y.o. Male presents to clinic for post-op follow-up s/p Ex lap right hemicolectomy for low grade mucinous neoplasm. Patient reports he has been doing well. His pain is controlled with OTC pain medications. He is able to move around more and having daily BMs.  Overall, he is feeling good. He denies fevers, chills, and wants to get back to more daily activities.  He has been eating well.  Review of Systems:  No fevers, chills No drainage from midline wound  No CP or SOB  All other review of systems: otherwise negative   Vital Signs:  BP (!) 111/54   Temp 98.2 F (36.8 C)   Resp 18   Ht 6\' 2"  (1.88 m)   Wt 205 lb (93 kg)   BMI 26.32 kg/m    Physical Exam:  Physical Exam  Constitutional: He is oriented to person, place, and time and well-developed, well-nourished, and in no distress.  Pulmonary/Chest: Effort normal.  Abdominal: Soft. He exhibits no distension. There is no tenderness.  Midline wound incision c/d/i with staples, no erythema and minimal drainage around umbilicus, not able to express drainage   Musculoskeletal: Normal range of motion.  Neurological: He is alert and oriented to person, place, and time.  Skin: Skin is warm and dry.    Laboratory studies: None   Imaging:  CT a/p May 10, 2017 - IMPRESSION: 1 cm stone over the left proximal to mid ureter causing low to moderate grade obstruction. 2.6 cm right renal cyst.  Hypodense tubular structure over the right lower quadrant measuring 4.7 x 4.8 x 8.3 cm likely appendiceal in origin. This may represent a mucocele of the appendix versus appendiceal carcinoma. Less likely this could be due to Meckel's diverticulum or other mesenteric mass. Recommend surgical consultation.  Several nodules over the right lower lobe with the largest measuring 1 cm. Recommend followup CT 3 months. This recommendation follows the consensus statement: Guidelines for Management of  Small Pulmonary Nodules Detected on CT Scans: A Statement from the [employer] as published in Radiology 2005; 237:395-400. Online at: https://www.arnold.com/.  Small bilateral inguinal hernias containing only peritoneal fat. Small umbilical hernia containing only peritoneal fat.   Pathology: Diagnosis Colon, segmental resection for tumor, right - LOW GRADE APPENDICEAL MUCINOUS NEOPLASM. - EPITHELIUM IS LIMITED TO APPENDIX LUMEN. - ACELLULAR MUCIN FOCALLY EXTENDS INTO SUBSEROSA. - RESECTION MARGINS ARE NEGATIVE FOR TUMOR OR MUCIN. - TWENTY OF TWENTY LYMPH NODE NEGATIVE FOR TUMOR (0/20).   Assessment:  66 y.o. 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 Removed intact and no signs of spread or implants.  Overall healing well. Staples removed and steri-strips applied. During the initial evaluation in the ED that found this mass, there was also concern for lung nodules and patient needs to get a CT scan of the chest in December 2018.    I have discussed his pathology with Surgical Oncologist, and will recommend a surveillance schedule of CT a/p with IV and oral contrast every 6 months for 3 years and then annually for a few years.  I will plan to follow him during this time.   Plan:  - Marcelle Overlie PA to see soon and will get him to get CT chest given the lung nodules seen on ED CT scan - rule out other malignancy, not related to the the low grade mucinous neoplasm  - Follow up April with me after CT a/p with IV and oral contrast for surveillance  -  Steristrips will fall off in the next couple of weeks   All of the above recommendations were discussed with the patient and patient's family (Wife Vita Barley and Daughter Loma Sousa), and all of patient's and family's questions were answered to their expressed satisfaction.  Curlene Labrum, MD [facility] [address] [place], [place] [address] [phone]  (office)

## 2017-04-29 NOTE — Patient Instructions (Signed)
Follow up with PCP For CT chest in 3 months (December 2018). Follow up with Dr. Constance Haw in 6 months with CT abdomen and pelvis with contrast.

## 2017-05-06 ENCOUNTER — Other Ambulatory Visit: Payer: Self-pay | Admitting: Urology

## 2017-05-10 ENCOUNTER — Encounter (HOSPITAL_BASED_OUTPATIENT_CLINIC_OR_DEPARTMENT_OTHER): Payer: Self-pay | Admitting: *Deleted

## 2017-05-10 NOTE — Progress Notes (Signed)
NPO AFTER MN.  ARRIVE AT 1146.  NEEDS ISTAT 8.  CURRENT EKG IN CHART AND EPIC.  WILL TAKE METOPROLOL AND PRILOSEC AM DOS W/ SIPS OF WATER AND IF NEEDED TAKE OXYCODONE/ TYLENOL .

## 2017-05-11 LAB — RENAL FUNCTION PANEL
ALBUMIN MSPROF: 3.6 g/dL (ref 3.6–5.1)
BUN / CREAT RATIO: 14 (calc) (ref 6–22)
BUN: 19 mg/dL (ref 7–25)
CALCIUM: 9.1 mg/dL (ref 8.6–10.3)
CO2: 28 mmol/L (ref 20–32)
CREATININE: 1.35 mg/dL — AB (ref 0.70–1.25)
Chloride: 110 mmol/L (ref 98–110)
Glucose, Bld: 142 mg/dL — ABNORMAL HIGH (ref 65–99)
Phosphorus: 3.4 mg/dL (ref 2.5–4.5)
Potassium: 4.7 mmol/L (ref 3.5–5.3)
SODIUM: 144 mmol/L (ref 135–146)

## 2017-05-11 LAB — HEMOGLOBIN A1C
EAG (MMOL/L): 7.1 (calc)
Hgb A1c MFr Bld: 6.1 % of total Hgb — ABNORMAL HIGH (ref <5.7)
Mean Plasma Glucose: 128 (calc)

## 2017-05-17 ENCOUNTER — Encounter (HOSPITAL_BASED_OUTPATIENT_CLINIC_OR_DEPARTMENT_OTHER): Payer: Self-pay | Admitting: *Deleted

## 2017-05-17 ENCOUNTER — Ambulatory Visit (HOSPITAL_BASED_OUTPATIENT_CLINIC_OR_DEPARTMENT_OTHER): Payer: BC Managed Care – PPO | Admitting: Anesthesiology

## 2017-05-17 ENCOUNTER — Ambulatory Visit (HOSPITAL_BASED_OUTPATIENT_CLINIC_OR_DEPARTMENT_OTHER)
Admission: RE | Admit: 2017-05-17 | Discharge: 2017-05-17 | Disposition: A | Discharge status: Home / Self Care | Payer: BC Managed Care – PPO | Source: Ambulatory Visit | Attending: Urology | Admitting: Urology

## 2017-05-17 ENCOUNTER — Encounter (HOSPITAL_BASED_OUTPATIENT_CLINIC_OR_DEPARTMENT_OTHER)
Admission: RE | Disposition: A | Discharge status: Home / Self Care | Payer: Self-pay | Source: Ambulatory Visit | Attending: Urology

## 2017-05-17 DIAGNOSIS — E119 Type 2 diabetes mellitus without complications: Secondary | ICD-10-CM | POA: Insufficient documentation

## 2017-05-17 DIAGNOSIS — I252 Old myocardial infarction: Secondary | ICD-10-CM | POA: Diagnosis not present

## 2017-05-17 DIAGNOSIS — G4730 Sleep apnea, unspecified: Secondary | ICD-10-CM | POA: Insufficient documentation

## 2017-05-17 DIAGNOSIS — E7800 Pure hypercholesterolemia, unspecified: Secondary | ICD-10-CM | POA: Diagnosis not present

## 2017-05-17 DIAGNOSIS — Z7982 Long term (current) use of aspirin: Secondary | ICD-10-CM | POA: Insufficient documentation

## 2017-05-17 DIAGNOSIS — I10 Essential (primary) hypertension: Secondary | ICD-10-CM | POA: Insufficient documentation

## 2017-05-17 DIAGNOSIS — Z79899 Other long term (current) drug therapy: Secondary | ICD-10-CM | POA: Insufficient documentation

## 2017-05-17 DIAGNOSIS — N201 Calculus of ureter: Secondary | ICD-10-CM | POA: Diagnosis present

## 2017-05-17 DIAGNOSIS — F419 Anxiety disorder, unspecified: Secondary | ICD-10-CM | POA: Insufficient documentation

## 2017-05-17 HISTORY — PX: CYSTOSCOPY/URETEROSCOPY/HOLMIUM LASER/STENT PLACEMENT: SHX6546

## 2017-05-17 LAB — POCT I-STAT, CHEM 8
BUN: 15 mg/dL (ref 6–20)
CHLORIDE: 105 mmol/L (ref 101–111)
Calcium, Ion: 1.28 mmol/L (ref 1.15–1.40)
Creatinine, Ser: 1.2 mg/dL (ref 0.61–1.24)
Glucose, Bld: 109 mg/dL — ABNORMAL HIGH (ref 65–99)
HEMATOCRIT: 41 % (ref 39.0–52.0)
Hemoglobin: 13.9 g/dL (ref 13.0–17.0)
POTASSIUM: 4.1 mmol/L (ref 3.5–5.1)
Sodium: 143 mmol/L (ref 135–145)
TCO2: 25 mmol/L (ref 22–32)

## 2017-05-17 SURGERY — CYSTOSCOPY/URETEROSCOPY/HOLMIUM LASER/STENT PLACEMENT
Anesthesia: General | Site: Renal | Laterality: Left

## 2017-05-17 MED ORDER — EPHEDRINE 5 MG/ML INJ
INTRAVENOUS | Status: AC
  Filled 2017-05-17: qty 10

## 2017-05-17 MED ORDER — ONDANSETRON HCL 4 MG/2ML IJ SOLN
INTRAMUSCULAR | Status: AC
  Filled 2017-05-17: qty 2

## 2017-05-17 MED ORDER — EPHEDRINE SULFATE-NACL 50-0.9 MG/10ML-% IV SOSY
PREFILLED_SYRINGE | INTRAVENOUS | Status: DC | PRN
  Administered 2017-05-17: 10 mg via INTRAVENOUS

## 2017-05-17 MED ORDER — LIDOCAINE HCL (CARDIAC) 20 MG/ML IV SOLN
INTRAVENOUS | Status: DC | PRN
  Administered 2017-05-17: 80 mg via INTRAVENOUS

## 2017-05-17 MED ORDER — MIDAZOLAM HCL 2 MG/2ML IJ SOLN
INTRAMUSCULAR | Status: AC
  Filled 2017-05-17: qty 2

## 2017-05-17 MED ORDER — FENTANYL CITRATE (PF) 100 MCG/2ML IJ SOLN
INTRAMUSCULAR | Status: AC
  Filled 2017-05-17: qty 2

## 2017-05-17 MED ORDER — DEXAMETHASONE SODIUM PHOSPHATE 10 MG/ML IJ SOLN
INTRAMUSCULAR | Status: AC
  Filled 2017-05-17: qty 1

## 2017-05-17 MED ORDER — CEFAZOLIN SODIUM-DEXTROSE 2-4 GM/100ML-% IV SOLN
2.0000 g | Freq: Once | INTRAVENOUS | Status: AC
  Administered 2017-05-17: 2 g via INTRAVENOUS
  Filled 2017-05-17: qty 100

## 2017-05-17 MED ORDER — IOHEXOL 300 MG/ML  SOLN
INTRAMUSCULAR | Status: DC | PRN
  Administered 2017-05-17: 2 mL

## 2017-05-17 MED ORDER — CEFAZOLIN SODIUM-DEXTROSE 2-4 GM/100ML-% IV SOLN
INTRAVENOUS | Status: AC
  Filled 2017-05-17: qty 100

## 2017-05-17 MED ORDER — SODIUM CHLORIDE 0.9 % IR SOLN
Status: DC | PRN
  Administered 2017-05-17 (×2): 3000 mL

## 2017-05-17 MED ORDER — FENTANYL CITRATE (PF) 100 MCG/2ML IJ SOLN
INTRAMUSCULAR | Status: DC | PRN
  Administered 2017-05-17 (×2): 25 ug via INTRAVENOUS
  Administered 2017-05-17: 50 ug via INTRAVENOUS

## 2017-05-17 MED ORDER — ONDANSETRON HCL 4 MG/2ML IJ SOLN
INTRAMUSCULAR | Status: DC | PRN
  Administered 2017-05-17: 4 mg via INTRAVENOUS

## 2017-05-17 MED ORDER — ARTIFICIAL TEARS OPHTHALMIC OINT
TOPICAL_OINTMENT | OPHTHALMIC | Status: AC
  Filled 2017-05-17: qty 3.5

## 2017-05-17 MED ORDER — LIDOCAINE 2% (20 MG/ML) 5 ML SYRINGE
INTRAMUSCULAR | Status: AC
  Filled 2017-05-17: qty 5

## 2017-05-17 MED ORDER — EPHEDRINE SULFATE 50 MG/ML IJ SOLN
INTRAMUSCULAR | Status: DC | PRN
  Administered 2017-05-17: 10 mg via INTRAVENOUS

## 2017-05-17 MED ORDER — PROPOFOL 10 MG/ML IV BOLUS
INTRAVENOUS | Status: DC | PRN
  Administered 2017-05-17: 150 mg via INTRAVENOUS
  Administered 2017-05-17: 20 mg via INTRAVENOUS

## 2017-05-17 MED ORDER — HYDROMORPHONE HCL 1 MG/ML IJ SOLN
0.2500 mg | INTRAMUSCULAR | Status: DC | PRN
  Filled 2017-05-17: qty 0.5

## 2017-05-17 MED ORDER — PROMETHAZINE HCL 25 MG/ML IJ SOLN
6.2500 mg | INTRAMUSCULAR | Status: DC | PRN
  Filled 2017-05-17: qty 1

## 2017-05-17 MED ORDER — LIDOCAINE 2% (20 MG/ML) 5 ML SYRINGE
INTRAMUSCULAR | Status: DC | PRN
  Administered 2017-05-17: 60 mg via INTRAVENOUS

## 2017-05-17 MED ORDER — LACTATED RINGERS IV SOLN
INTRAVENOUS | Status: DC
  Administered 2017-05-17 (×2): via INTRAVENOUS
  Filled 2017-05-17: qty 1000

## 2017-05-17 MED ORDER — PROPOFOL 10 MG/ML IV BOLUS
INTRAVENOUS | Status: AC
  Filled 2017-05-17: qty 40

## 2017-05-17 SURGICAL SUPPLY — 21 items
BAG DRAIN URO-CYSTO SKYTR STRL (DRAIN) ×3 IMPLANT
BAG DRN UROCATH (DRAIN) ×1
CATH INTERMIT  6FR 70CM (CATHETERS) ×3 IMPLANT
CLOTH BEACON ORANGE TIMEOUT ST (SAFETY) ×3 IMPLANT
EXTRACTOR STONE 1.7FRX115CM (UROLOGICAL SUPPLIES) ×3 IMPLANT
FIBER LASER FLEXIVA 365 (UROLOGICAL SUPPLIES) IMPLANT
FIBER LASER TRAC TIP (UROLOGICAL SUPPLIES) ×3 IMPLANT
GLOVE BIO SURGEON STRL SZ7.5 (GLOVE) ×3 IMPLANT
GOWN STRL REUS W/TWL LRG LVL3 (GOWN DISPOSABLE) ×3 IMPLANT
GOWN STRL REUS W/TWL XL LVL3 (GOWN DISPOSABLE) ×3 IMPLANT
GUIDEWIRE STR DUAL SENSOR (WIRE) ×6 IMPLANT
INFUSOR MANOMETER BAG 3000ML (MISCELLANEOUS) ×3 IMPLANT
IV NS IRRIG 3000ML ARTHROMATIC (IV SOLUTION) ×6 IMPLANT
KIT RM TURNOVER CYSTO AR (KITS) ×3 IMPLANT
MANIFOLD NEPTUNE II (INSTRUMENTS) ×3 IMPLANT
PACK CYSTO (CUSTOM PROCEDURE TRAY) ×3 IMPLANT
SHEATH URET ACCESS 12FR/35CM (UROLOGICAL SUPPLIES) ×3 IMPLANT
STENT URET 6FRX26 CONTOUR (STENTS) ×3 IMPLANT
TUBE CONNECTING 12'X1/4 (SUCTIONS) ×1
TUBE CONNECTING 12X1/4 (SUCTIONS) ×2 IMPLANT
WATER STERILE IRR 500ML POUR (IV SOLUTION) ×3 IMPLANT

## 2017-05-17 NOTE — Anesthesia Postprocedure Evaluation (Signed)
Anesthesia Post Note  Patient: [name] [name]  Procedure(s) Performed: CYSTOSCOPY / RETROGRADE PYELOGRAM / URETEROSCOPY / HOLMIUM LASER / STONE BASKETRY / STENT EXCHANGE (Left Renal)     Patient location during evaluation: PACU Anesthesia Type: General Level of consciousness: sedated Pain management: pain level controlled Vital Signs Assessment: post-procedure vital signs reviewed and stable Respiratory status: spontaneous breathing and respiratory function stable Cardiovascular status: stable Postop Assessment: no apparent nausea or vomiting Anesthetic complications: no    Last Vitals:  Vitals:   05/17/17 1215 05/17/17 1230  BP: (!) 141/63 (!) 149/76  Pulse: 67 69  Resp: 17 (!) 21  Temp:    SpO2: 100% 100%                  Mozel Burdett DANIEL

## 2017-05-17 NOTE — Anesthesia Procedure Notes (Signed)
Procedure Name: LMA Insertion Date/Time: 05/17/2017 10:56 AM Performed by: Duane Boston, MD Pre-anesthesia Checklist: Patient identified, Emergency Drugs available, Suction available and Patient being monitored Patient Re-evaluated:Patient Re-evaluated prior to induction Oxygen Delivery Method: Circle system utilized Preoxygenation: Pre-oxygenation with 100% oxygen Induction Type: IV induction Ventilation: Mask ventilation without difficulty LMA: LMA inserted LMA Size: 5.0 Number of attempts: 1 Airway Equipment and Method: Bite block Placement Confirmation: positive ETCO2 Tube secured with: Tape Dental Injury: Teeth and Oropharynx as per pre-operative assessment

## 2017-05-17 NOTE — H&P (Signed)
CC: I have kidney stones.(Surgery)  HPI: [name] [name] is a 66 year-old male established patient who is here for renal calculi after a surgical intervention.  The problem is on the left side. He had stent for treatment of his renal calculi. Patient denies ureteroscopy, eswl, and percutaneous lithotomy. This procedure was done 04/08/2017. He did not pass stone fragments. This is his first kidney stone. He does have a stent in place.   He is currently having flank pain. He denies having back pain, groin pain, nausea, vomiting, fever, and chills.   He does not have dysuria. He does not have urgency. He does not have frequency.   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 He has fully recovered from this. Fortunately this showed benign pathology. He is eager to undergo definitive management of his stone.     ALLERGIES: No Allergies    MEDICATIONS: Aspirin 325 mg tablet 1 tablet PO Daily  Metoprolol Tartrate 25 mg tablet 1 tablet PO Daily  Omeprazole 20 mg tablet, delayed release 1 tablet PO Daily  Oxybutynin Chloride 5 mg tablet 1 tablet PO Q 8 H PRN Bladder spasms  Tamsulosin Hcl 0.4 mg capsule, ext release 24 hr 1 capsule PO Daily  Escitalopram Oxalate 10 mg tablet 1 tablet PO Daily  Hydrocodone-Acetaminophen 5 mg-325 mg tablet 1 tablet PO Q 6 H PRN pain  Lisinopril 10 MG Oral Tablet Oral  Lovastatin 20 mg tablet 1 tablet PO Daily  MetFORMIN HCl ER 500 MG Oral Tablet Extended Release 24 Hour Oral     GU PSH: Cystoscopy Insert Stent, Left - 04/08/2017 Cystoscopy Ureteroscopy, Left - 04/08/2017      PSH Notes: Nasal Septal Deviation Repair  quadruple bypass 2014   NON-GU PSH: Tonsillectomy - 1960    GU PMH: Ureteral calculus (Acute), Left, Significantly symptomatic 10x7 mm left proximal ureteral stone. Hounsfield units 1400, skin to stone distance 13 cm. Complicating factor here is the patient will be having procedure next week for his periappendiceal mass. He would like  to have his stone management for that. - 04/06/2017 ED due to arterial insufficiency, Erectile dysfunction due to arterial insufficiency - 2014 Other microscopic hematuria, Microscopic hematuria - 2014 Unil Inguinal Hernia W/O obst or gang,non-recurrent, Inguinal hernia, unilateral - 2014      PMH Notes:  1898-07-13 00:00:00 - Note: Normal Routine History And Physical Adult  2011-08-11 09:04:55 - Note: Chronic Reflux Esophagitis  stomach ulcers   NON-GU PMH: Gout, Gout - 2014 Personal history of other diseases of the circulatory system, History of hypertension - 2014 Personal history of other diseases of the nervous system and sense organs, History of sleep apnea - 2014 Personal history of other endocrine, nutritional and metabolic disease, History of diabetes mellitus - 2014 Anxiety Diabetes Type 2 Heart disease, unspecified Hypercholesterolemia Hypertension Myocardial Infarction Sleep Apnea    FAMILY HISTORY: Cancer - Father Family Health Status Number - Runs In Family Heart Disease - Runs In Family Kidney Stones - Daughter   SOCIAL HISTORY: Marital Status: Married Preferred Language: English; Race: White Current Smoking Status: Patient has never smoked.   Tobacco Use Assessment Completed: Used Tobacco in last 30 days? Has never drank.  Drinks 2 caffeinated drinks per day. Patient's occupation is/was retired.     Notes: Marital History - Currently Married, Occupation:, Caffeine Use, Never A Smoker, Alcohol Use   REVIEW OF SYSTEMS:    GU Review Male:   Patient reports burning/ pain with urination and get up at night  to urinate. Patient denies frequent urination, hard to postpone urination, leakage of urine, stream starts and stops, trouble starting your stream, have to strain to urinate , erection problems, and penile pain.  Gastrointestinal (Upper):   Patient denies nausea, vomiting, and indigestion/ heartburn.  Gastrointestinal (Lower):   Patient denies diarrhea and  constipation.  Constitutional:   Patient denies fever, night sweats, weight loss, and fatigue.  Skin:   Patient denies itching and skin rash/ lesion.  Eyes:   Patient denies blurred vision and double vision.  Ears/ Nose/ Throat:   Patient denies sore throat and sinus problems.  Hematologic/Lymphatic:   Patient denies swollen glands and easy bruising.  Cardiovascular:   Patient denies leg swelling and chest pains.  Respiratory:   Patient denies cough and shortness of breath.  Endocrine:   Patient denies excessive thirst.  Musculoskeletal:   Patient denies back pain and joint pain.  Neurological:   Patient denies headaches and dizziness.  Psychologic:   Patient denies depression and anxiety.   VITAL SIGNS:      05/05/2017 02:10 PM  BP 114/65 mmHg  Pulse 58 /min  Temperature 98.1 F / 36.7 C   MULTI-SYSTEM PHYSICAL EXAMINATION:    Constitutional: Well-nourished. No physical deformities. Normally developed. Good grooming.  Respiratory: No labored breathing, no use of accessory muscles.   Cardiovascular: Normal temperature, adequate peripheral perfusion  Skin: No paleness, no jaundice,  Neurologic / Psychiatric: Oriented to time, oriented to place, oriented to person. No depression, no anxiety, no agitation.  Gastrointestinal: No mass, no tenderness, no rigidity, non obese abdomen. No CVA tenderness bilaterally  Eyes: Normal conjunctivae. Normal eyelids.  Musculoskeletal: Normal gait and station of head and neck.     PAST DATA REVIEWED:  Source Of History:  Patient  X-Ray Review: C.T. Abdomen/Pelvis: Reviewed Films. Reviewed Report. Discussed With Patient.     PROCEDURES:          Urinalysis w/Scope Dipstick Dipstick Cont'd Micro  Color: Yellow Bilirubin: Neg WBC/hpf: 10 - 20/hpf  Appearance: Cloudy Ketones: Neg RBC/hpf: 20 - 40/hpf  Specific Gravity: 1.020 Blood: 2+ Bacteria: Few (10-25/hpf)  pH: 5.5 Protein: 2+ Cystals: NS (Not Seen)  Glucose: Neg Urobilinogen: 0.2 Casts: NS  (Not Seen)    Nitrites: Neg Trichomonas: Not Present    Leukocyte Esterase: 2+ Mucous: Present      Epithelial Cells: 0 - 5/hpf      Yeast: NS (Not Seen)      Sperm: Not Present    ASSESSMENT:      ICD-10 Details  1 GU:   Ureteral calculus - N20.1    PLAN:           Orders Labs Urine Culture          Schedule Return Visit/Planned Activity: ASAP - Schedule Surgery          Document Letter(s):  Created for Patient: Clinical Summary         Notes:   We discussed the management of urinary stones.   For ureteroscopy I described the risks which include heart attack, stroke, pulmonary embolus, death, bleeding, infection, damage to contiguous structures, positioning injury, ureteral stricture, ureteral avulsion, ureteral injury, need for ureteral stent, inability to perform ureteroscopy, need for an interval procedure, inability to clear stone burden, stent discomfort and pain. He would like to proceed with this.      Signed by Link Snuffer, III, M.D. on 05/05/17 at 2:40 PM (EDT)

## 2017-05-17 NOTE — Discharge Instructions (Addendum)
Post Anesthesia Home Care Instructions  Activity: Get plenty of rest for the remainder of the day. A responsible individual must stay with you for 24 hours following the procedure.  For the next 24 hours, DO NOT: -Drive a car -Paediatric nurse -Drink alcoholic beverages -Take any medication unless instructed by your physician -Make any legal decisions or sign important papers.  Meals: Start with liquid foods such as gelatin or soup. Progress to regular foods as tolerated. Avoid greasy, spicy, heavy foods. If nausea and/or vomiting occur, drink only clear liquids until the nausea and/or vomiting subsides. Call your physician if vomiting continues.  Special Instructions/Symptoms: Your throat may feel dry or sore from the anesthesia or the breathing tube placed in your throat during surgery. If this causes discomfort, gargle with warm salt water. The discomfort should disappear within 24 hours.  If you had a scopolamine patch placed behind your ear for the management of post- operative nausea and/or vomiting:  1. The medication in the patch is effective for 72 hours, after which it should be removed.  Wrap patch in a tissue and discard in the trash. Wash hands thoroughly with soap and water. 2. You may remove the patch earlier than 72 hours if you experience unpleasant side effects which may include dry mouth, dizziness or visual disturbances. 3. Avoid touching the patch. Wash your hands with soap and water after contact with the patch.   [facility] [phone] Post Ureteroscopy With or Without Stent Instructions  REMOVE YOUR STENT ON Thursday MORNING AT 7AM  Definitions:  Ureter: The duct that transports urine from the kidney to the bladder. Stent:   A plastic hollow tube that is placed into the ureter, from the kidney to the                 bladder to prevent the ureter from swelling shut.  GENERAL INSTRUCTIONS:  Despite the fact that no skin incisions were  used, the area around the ureter and bladder is raw and irritated. The stent is a foreign body which will further irritate the bladder wall. This irritation is manifested by increased frequency of urination, both day and night, and by an increase in the urge to urinate. In some, the urge to urinate is present almost always. Sometimes the urge is strong enough that you may not be able to stop yourself from urinating. The only real cure is to remove the stent and then give time for the bladder wall to heal which can't be done until the danger of the ureter swelling shut has passed, which varies.  You may see some blood in your urine while the stent is in place and a few days afterwards. Do not be alarmed, even if the urine was clear for a while. Get off your feet and drink lots of fluids until clearing occurs. If you start to pass clots or don't improve, call us.  DIET: You may return to your normal diet immediately. Because of the raw surface of your bladder, alcohol, spicy foods, acid type foods and drinks with caffeine may cause irritation or frequency and should be used in moderation. To keep your urine flowing freely and to avoid constipation, drink plenty of fluids during the day ( 8-10 glasses ). Tip: Avoid cranberry juice because it is very acidic.  ACTIVITY: Your physical activity doesn't need to be restricted. However, if you are very active, you may see some blood in your urine. We suggest that you reduce your activity under  these circumstances until the bleeding has stopped.  BOWELS: It is important to keep your bowels regular during the postoperative period. Straining with bowel movements can cause bleeding. A bowel movement every other day is reasonable. Use a mild laxative if needed, such as Milk of Magnesia 2-3 tablespoons, or 2 Dulcolax tablets. Call if you continue to have problems. If you have been taking narcotics for pain, before, during or after your surgery, you may be constipated.  Take a laxative if necessary.   MEDICATION: You should resume your pre-surgery medications unless told not to. You may take oxybutynin or flomax if prescribed for bladder spasms or discomfort from the stent Take pain medication as directed for pain refractory to conservative management  PROBLEMS YOU SHOULD REPORT TO [place]:  Fevers over 100.5 Fahrenheit.  Heavy bleeding, or clots ( See above notes about blood in urine ).  Inability to urinate.  Drug reactions ( hives, rash, nausea, vomiting, diarrhea ).  Severe burning or pain with urination that is not improving.

## 2017-05-17 NOTE — Anesthesia Preprocedure Evaluation (Signed)
Anesthesia Evaluation  Patient identified by MRN, date of birth, ID band Patient awake    Reviewed: Allergy & Precautions, NPO status , Patient's Chart, lab work & pertinent test results  History of Anesthesia Complications Negative for: history of anesthetic complications  Airway Mallampati: II  TM Distance: >3 FB Neck ROM: Full    Dental  (+) Teeth Intact, Implants, Dental Advisory Given All teeth non removable implants, advised about chipping and damage possibility, and he accepts.:   Pulmonary sleep apnea and Continuous Positive Airway Pressure Ventilation ,    breath sounds clear to auscultation       Cardiovascular hypertension, Pt. on medications + CAD, + Past MI and + CABG   Rhythm:Regular Rate:Normal  LV EF: 55% -   60%   Neuro/Psych PSYCHIATRIC DISORDERS Anxiety negative neurological ROS     GI/Hepatic Neg liver ROS, GERD  ,  Endo/Other  diabetes, Type 2, Oral Hypoglycemic Agents  Renal/GU Renal InsufficiencyRenal disease     Musculoskeletal   Abdominal   Peds  Hematology negative hematology ROS (+)   Anesthesia Other Findings   Reproductive/Obstetrics                             Anesthesia Physical  Anesthesia Plan  ASA: III  Anesthesia Plan: General   Post-op Pain Management:    Induction: Intravenous  PONV Risk Score and Plan: 3 and Ondansetron, Dexamethasone and Treatment may vary due to age or medical condition  Airway Management Planned: LMA  Additional Equipment:   Intra-op Plan:   Post-operative Plan: Extubation in OR  Informed Consent: I have reviewed the patients History and Physical, chart, labs and discussed the procedure including the risks, benefits and alternatives for the proposed anesthesia with the patient or authorized representative who has indicated his/her understanding and acceptance.   Dental advisory given  Plan Discussed with: CRNA and  Anesthesiologist  Anesthesia Plan Comments:         Anesthesia Quick Evaluation

## 2017-05-17 NOTE — Interval H&P Note (Signed)
History and Physical Interval Note:  05/17/2017 10:29 AM  [name] [name]  has presented today for surgery, with the diagnosis of LEFT URETERAL CALCULUS  The various methods of treatment have been discussed with the patient and family. After consideration of risks, benefits and other options for treatment, the patient has consented to  Procedure(s) with comments: CYSTOSCOPY/URETEROSCOPY/HOLMIUM LASER/STENT PLACEMENT (Left) - ONLY NEEDS 45 MIN FOR PROCEDURE as a surgical intervention .  The patient's history has been reviewed, patient examined, no change in status, stable for surgery.  I have reviewed the patient's chart and labs.  Questions were answered to the patient's satisfaction.     Marton Redwood, III

## 2017-05-17 NOTE — Transfer of Care (Signed)
Immediate Anesthesia Transfer of Care Note  Patient: [name] [name]  Procedure(s) Performed: CYSTOSCOPY / RETROGRADE PYELOGRAM / URETEROSCOPY / HOLMIUM LASER / STONE BASKETRY / STENT EXCHANGE (Left Renal)  Patient Location: PACU  Anesthesia Type:General  Level of Consciousness: awake, alert , oriented and patient cooperative  Airway & Oxygen Therapy: Patient Spontanous Breathing and Patient connected to nasal cannula oxygen  Post-op Assessment: Report given to RN and Post -op Vital signs reviewed and stable  Post vital signs: Reviewed and stable  Last Vitals:  Vitals:   05/17/17 0825  BP: 116/63  Pulse: (!) 57  Resp: 18  Temp: 36.7 C  SpO2: 100%    Last Pain:  Vitals:   05/17/17 0847  TempSrc:   PainSc: 8          Complications: No apparent anesthesia complications

## 2017-05-17 NOTE — Op Note (Addendum)
Operative Note  Preoperative diagnosis:  1.  Left ureteral calculus  Postoperative diagnosis: 1.  Left ureteral calculus  Procedure(s): 1.  Cystoscopy with left retrograde pyelogram, left ureteroscopy with laser lithotripsy, ureteral stent placement   Surgeon: Link Snuffer, MD  Assistants: None   Anesthesia: General  Complications: None immediate   EBL: Minimal  Specimens: 1.  None  Drains/Catheters: 1.  6 x 26 double-J ureteral stent  Intraoperative findings: 1 cm left ureteral calculus fragmented completely.  Retrograde pyelogram revealed no hydronephrosis and no obvious filling defects.  Indication: 66 year old male with a history of left ureteral calculus status post ureteral stent placement presents for definitive management the patient was identified and consent was obtained.  He was taken the operating room placed in supine position.  He was placed under general anesthesia.  Description of procedure:  He was converted to dorsal lithotomy and prepped and draped in standard sterile fashion.  Perioperative antibiotics were administered.  Timeout was performed.  I first advanced a 36 French rigid cystoscope into the urethra and into the bladder performed a complete cystoscopy with no abnormal findings.  I removed the left ureteral stent and reinserted the scope and passed a sensor wire up into the kidney under fluoroscopic guidance.  I then advanced a semirigid ureteroscope alongside the wire and up the ureter to the stone of interest which was fragmented slightly.  It was pushed into the renal pelvis.  I therefore shot a retrograde pyelogram with findings noted above.  I advanced a second sensor wire through the scope up into the kidney.  I advanced a 12 x 14 ureteral access sheath over the wire under continuous fluoroscopic guidance up to the level of the renal pelvis.  Flexible ureteroscopy relocated the stone which was fragmented to less than 1 mm fragments.  I performed a  complete pyeloscopy and no other stone fragments were seen.  The scope and the access sheath were withdrawn with no ureteral injury or stones seen.  I then advanced a 6 x 26 double-J ureteral stent over the wire under fluoroscopic guidance and remove the wire.  Fluoroscopy confirmed a good proximal and distal coil.  I then drained the bladder.  A string was left on the stent.  This concluded the operation the patient tolerated procedure well and was stable postoperatively.  Plan: The patient will remove there stent in 3 days and follow-up in about 4-6 weeks with a renal ultrasound and KUB.

## 2017-05-18 ENCOUNTER — Encounter (HOSPITAL_BASED_OUTPATIENT_CLINIC_OR_DEPARTMENT_OTHER): Payer: Self-pay | Admitting: Urology

## 2017-05-18 NOTE — Addendum Note (Signed)
Addendum  created 05/18/17 9242 by Wanita Chamberlain, CRNA   Intraprocedure Meds edited

## 2017-05-18 NOTE — Addendum Note (Signed)
Addendum  created 05/18/17 5038 by Wanita Chamberlain, CRNA   Intraprocedure Staff edited

## 2017-05-19 ENCOUNTER — Encounter: Payer: Self-pay | Admitting: "Endocrinology

## 2017-05-19 ENCOUNTER — Ambulatory Visit (INDEPENDENT_AMBULATORY_CARE_PROVIDER_SITE_OTHER): Payer: BC Managed Care – PPO | Admitting: "Endocrinology

## 2017-05-19 VITALS — BP 129/76 | HR 51 | Ht 74.0 in | Wt 210.0 lb

## 2017-05-19 DIAGNOSIS — I10 Essential (primary) hypertension: Secondary | ICD-10-CM | POA: Diagnosis not present

## 2017-05-19 DIAGNOSIS — E782 Mixed hyperlipidemia: Secondary | ICD-10-CM | POA: Diagnosis not present

## 2017-05-19 DIAGNOSIS — E1159 Type 2 diabetes mellitus with other circulatory complications: Secondary | ICD-10-CM | POA: Diagnosis not present

## 2017-05-19 NOTE — Progress Notes (Signed)
Subjective:    Patient ID: [name] [name], male    DOB: 1950/08/15, PCP Rory Percy, MD   Past Medical History:  Diagnosis Date  . Anxiety   . 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 Arthritis   . Coronary artery disease cardiologist-  dr Domenic Polite   hx NSTEMI 04-19-2013--- Multivessel CAD /  04-25-2013 s/p post CABG x4,  (LIMA-LAD, SVG-D1, SVG-D2, SVG-OM)  . Essential hypertension, benign   . First degree heart block   . GERD (gastroesophageal reflux disease)   . Gout    as of 04-07-2017-- per pt/ wife stable  . History of kidney stones   . History of non-ST elevation myocardial infarction (NSTEMI) 04/19/2013   s/p  cabg  . History of peptic ulcer disease dx 1990s  . Hyperlipidemia   . Left ureteral stone   . Nocturia   . Nodule of right lung    per CT 04-04-2017  right lower lobe multiple noodules  . OSA on CPAP   . Renal cyst, right    per CT 04-04-2017  . Renal insufficiency   . S/P CABG x 4 04/25/2013   LIMA to LAD;  SVG to OD;  SVG to D2;  SVG to OM  . Type 2 diabetes mellitus ([place])    last A1c 6.8 on 01-02-2017  . Wears glasses    Past Surgical History:  Procedure Laterality Date  . CARDIOVASCULAR STRESS TEST  11-13-2016   dr Domenic Polite   Intermediate risk nuclear study w/ medium defect of moderate severity in the basal inferoseptal, basal inferior, mid inferoseptal and mid inferior location (findings consistant w/ prior myocardial infarction)/  mild enlarged LV cavity size;  nuclear stress EF 45%  . ELBOW BURSA SURGERY Right 03-10-2017     at [facility]   and removal of spur  . KNEE ARTHROSCOPY Left 10/ 2017  approx.    dr Theda Sers  . NASAL SEPTUM SURGERY  1990s approx  . TONSILLECTOMY  64 (age 66)  . TRANSTHORACIC ECHOCARDIOGRAM  11-17-2016   dr Domenic Polite   mild LVH,  ef 55-60%, LV wall motion & diastolic function indeterminant assessment,  images were inadequate/  mild LAE/  trivial Tr   Social History   Socioeconomic  History  . Marital status: Married    Spouse name: None  . Number of children: None  . Years of education: None  . Highest education level: None  Social Needs  . Financial resource strain: None  . Food insecurity - worry: None  . Food insecurity - inability: None  . Transportation needs - medical: None  . Transportation needs - non-medical: None  Occupational History  . Occupation: Drives Chief Operating Officer: [employer]  Tobacco Use  . Smoking status: Never Smoker  . Smokeless tobacco: Never Used  Substance and Sexual Activity  . Alcohol use: No    Alcohol/week: 0.0 oz  . Drug use: No  . Sexual activity: Yes    Birth control/protection: None  Other Topics Concern  . None  Social History Narrative   Lives with wife, does not exercise but part of his job is strenuous. Works in a body-shop part-time, too.   2 of his mother's sisters and 1 brother died suddenly (family was told MI but no autopsy).    Outpatient Encounter Medications as of 05/19/2017  Medication Sig  . metFORMIN (GLUCOPHAGE) 500 MG tablet Take 2 (two) times daily with a meal  by mouth.  Marland Kitchen acetaminophen (TYLENOL) 500 MG tablet Take 2 tablets (1,000 mg total) by mouth every 6 (six) hours as needed for mild pain.  . Ascorbic Acid (VITAMIN C) 1000 MG tablet Take 1,000 mg by mouth daily.  Marland Kitchen aspirin EC 325 MG EC tablet Take 1 tablet (325 mg total) by mouth daily. (Patient taking differently: Take 325 mg by mouth every morning. )  . colchicine 0.6 MG tablet Take 0.6 mg by mouth 2 (two) times daily as needed.   . docusate sodium (COLACE) 100 MG capsule Take 1 capsule (100 mg total) by mouth 2 (two) times daily.  Marland Kitchen escitalopram (LEXAPRO) 10 MG tablet Take 10 mg by mouth at bedtime.   Marland Kitchen ibuprofen (ADVIL,MOTRIN) 800 MG tablet Take 1 tablet (800 mg total) by mouth every 6 (six) hours as needed for headache or moderate pain.  Marland Kitchen lisinopril (PRINIVIL,ZESTRIL) 10 MG tablet Take 5 mg by mouth every morning.   . lovastatin  (MEVACOR) 20 MG tablet TAKE ONE TABLET BY MOUTH AT BEDTIME, STOP TAKING ATORVASTATIN (Patient taking differently: Take 20 mg by mouth at bedtime. TAKE ONE TABLET BY MOUTH AT BEDTIME, STOP TAKING ATORVASTATIN)  . metoprolol tartrate (LOPRESSOR) 25 MG tablet Take 0.5 tablets (12.5 mg total) by mouth 2 (two) times daily. (Patient taking differently: Take 12.5 mg by mouth 2 (two) times daily. )  . Multiple Vitamin (MULTIVITAMIN WITH MINERALS) TABS tablet Take 1 tablet by mouth daily.  . Omega-3 Fatty Acids (FISH OIL) 1000 MG CPDR Take 1 capsule by mouth daily.  Marland Kitchen omeprazole (PRILOSEC) 20 MG capsule Take 20 mg by mouth every morning.   Marland Kitchen oxybutynin (DITROPAN) 5 MG tablet Take 5 mg by mouth every 8 (eight) hours as needed. For bladder spasms  . oxyCODONE (OXY IR/ROXICODONE) 5 MG immediate release tablet Take 1 tablet (5 mg total) by mouth every 4 (four) hours as needed for moderate pain or severe pain.  Marland Kitchen pyridoxine (B-6) 200 MG tablet Take 200 mg by mouth daily.  . tamsulosin (FLOMAX) 0.4 MG CAPS capsule Take 0.4 mg by mouth daily after supper.    No facility-administered encounter medications on file as of 05/19/2017.    ALLERGIES: No Known Allergies VACCINATION STATUS: Immunization History  Administered Date(s) Administered  . Influenza Split 02/10/2014  . Influenza,inj,Quad PF,6+ Mos 04/20/2017    Diabetes  He presents for his follow-up diabetic visit. He has type 2 diabetes mellitus. His disease course has been improving. There are no hypoglycemic associated symptoms. Pertinent negatives for hypoglycemia include no confusion, headaches, pallor or seizures. There are no diabetic associated symptoms. Pertinent negatives for diabetes include no chest pain, no fatigue, no polydipsia, no polyphagia, no polyuria and no weakness. There are no hypoglycemic complications. Symptoms are improving. Diabetic complications include heart disease. Risk factors for coronary artery disease include diabetes  mellitus, dyslipidemia, hypertension, male sex and sedentary lifestyle. He is compliant with treatment most of the time. His weight is decreasing steadily (He underwent abdominal surgery since last visit, removing complex mass lesion surrounding his appendix-nonmalignant.). He is following a generally unhealthy diet. When asked about meal planning, he reported none. He never participates in exercise.  Hyperlipidemia  This is a chronic problem. The current episode started more than 1 year ago. Pertinent negatives include no chest pain, myalgias or shortness of breath. Current antihyperlipidemic treatment includes statins. Risk factors for coronary artery disease include diabetes mellitus, dyslipidemia and hypertension.  Hypertension  This is a chronic problem. Pertinent negatives include no chest  pain, headaches, neck pain, palpitations or shortness of breath. Risk factors for coronary artery disease include dyslipidemia, diabetes mellitus, male gender and sedentary lifestyle. Past treatments include beta blockers.    Review of Systems  Constitutional: Negative for chills, fatigue, fever and unexpected weight change.  HENT: Negative for dental problem, mouth sores and trouble swallowing.   Eyes: Negative for visual disturbance.  Respiratory: Negative for cough, choking, chest tightness, shortness of breath and wheezing.   Cardiovascular: Negative for chest pain, palpitations and leg swelling.  Gastrointestinal: Negative for abdominal distention, abdominal pain, constipation, diarrhea, nausea and vomiting.  Endocrine: Negative for polydipsia, polyphagia and polyuria.  Genitourinary: Negative for dysuria, flank pain, hematuria and urgency.  Musculoskeletal: Negative for back pain, gait problem, myalgias and neck pain.  Skin: Negative for pallor, rash and wound.  Neurological: Negative for seizures, syncope, weakness, numbness and headaches.  Psychiatric/Behavioral: Negative.  Negative for confusion  and dysphoric mood.    Objective:    BP 129/76 (BP Location: Right Arm, Patient Position: Sitting, Cuff Size: Normal)   Pulse (!) 51   Ht 6\' 2"  (1.88 m)   Wt 210 lb (95.3 kg)   BMI 26.96 kg/m   Wt Readings from Last 3 Encounters:  05/19/17 210 lb (95.3 kg)  05/17/17 209 lb (94.8 kg)  04/29/17 205 lb (93 kg)    Physical Exam  Constitutional: He is oriented to person, place, and time. He appears well-developed and well-nourished. He is cooperative. No distress.  HENT:  Head: Normocephalic and atraumatic.  Eyes: EOM are normal.  Neck: Normal range of motion. Neck supple. No tracheal deviation present. No thyromegaly present.  Cardiovascular: Normal rate, S1 normal, S2 normal and normal heart sounds. Exam reveals no gallop.  No murmur heard. Pulses:      Dorsalis pedis pulses are 1+ on the right side, and 1+ on the left side.       Posterior tibial pulses are 1+ on the right side, and 1+ on the left side.  Pulmonary/Chest: Breath sounds normal. No respiratory distress. He has no wheezes.  Abdominal: Soft. Bowel sounds are normal. He exhibits no distension. There is no tenderness. There is no guarding and no CVA tenderness.  Musculoskeletal: He exhibits no edema.       Right shoulder: He exhibits no swelling and no deformity.  Neurological: He is alert and oriented to person, place, and time. He has normal strength and normal reflexes. No cranial nerve deficit or sensory deficit. Gait normal.  Skin: Skin is warm and dry. No rash noted. No cyanosis. Nails show no clubbing.  Psychiatric: He has a normal mood and affect. His speech is normal and behavior is normal. Judgment and thought content normal. Cognition and memory are normal.     CMP     Component Value Date/Time   NA 143 05/17/2017 0924   K 4.1 05/17/2017 0924   CL 105 05/17/2017 0924   CO2 28 05/10/2017 0816   GLUCOSE 109 (H) 05/17/2017 0924   BUN 15 05/17/2017 0924   CREATININE 1.20 05/17/2017 0924   CREATININE 1.35  (H) 05/10/2017 0816   CALCIUM 9.1 05/10/2017 0816   PROT 7.3 04/04/2017 1134   ALBUMIN 3.8 04/04/2017 1134   AST 19 04/04/2017 1134   ALT 19 04/04/2017 1134   ALKPHOS 102 04/04/2017 1134   BILITOT 2.0 (H) 04/04/2017 1134   GFRNONAA >60 04/21/2017 0445   GFRNONAA 58 (L) 07/02/2016 0853   GFRAA >60 04/21/2017 0445   GFRAA 67 07/02/2016  [phone]     Diabetic Labs (most recent): Lab Results  Component Value Date   HGBA1C 6.1 (H) 05/10/2017   HGBA1C 6.8 (H) 01/02/2017   HGBA1C 6.4 (H) 07/02/2016     Lipid Panel ( most recent) Lipid Panel     Component Value Date/Time   CHOL 114 07/02/2016 0853   TRIG 106 07/02/2016 0853   HDL 24 (L) 07/02/2016 0853   CHOLHDL 4.8 07/02/2016 0853   VLDL 21 07/02/2016 0853   LDLCALC 69 07/02/2016 0853     Assessment & Plan:   1. Type 2 diabetes mellitus with other circulatory complications (HCC)   His diabetes is  complicated by coronary artery disease status post coronary artery bypass graft and patient remains at a high risk for more acute and chronic complications of diabetes which include CAD, CVA, CKD, retinopathy, and neuropathy. These are all discussed in detail with the patient.  Patient came with improving A1c of 6.1%, from 6.8%. - Recent labs reviewed.   - I have re-counseled the patient on diet management  by adopting a carbohydrate restricted / protein rich  Diet.  -  Suggestion is made for him to avoid simple carbohydrates  from his diet including Cakes, Sweet Desserts / Pastries, Ice Cream, Soda (diet and regular), Sweet Tea, Candies, Chips, Cookies, Store Bought Juices, Alcohol in Excess of  1-2 drinks a day, Artificial Sweeteners, and "Sugar-free" Products. This will help patient to have stable blood glucose profile and potentially avoid unintended weight gain.   - Patient is advised to stick to a routine mealtimes to eat 3 meals  a day and avoid unnecessary snacks ( to snack only to correct hypoglycemia).  - I have approached  patient with the following individualized plan to manage diabetes and patient agrees.  - I will continue metformin 500 mg by mouth twice a day , therapeutically suitable for patient.  I have advised patient to keep adequate hydration and will repeat his renal function before his next visit.  - Patient will be considered for incretin therapy as appropriate next visit if he loses control. - Patient specific target  for A1c; LDL, HDL, Triglycerides, and  Waist Circumference were discussed in detail.  2) BP/HTN: Controlled. Continue current medications including beta blockers. 3) Lipids/HPL:  continue statins. 4)  Weight/Diet:  He has lost 20 past pounds since last visit, mainly due to abdominal surgery for appendiceal growth which was found to be low-grade neoplasm after surgery.  He is advised to resume  exercise, and carbohydrates information provided.  5) Chronic Care/Health Maintenance:  -Patient is on  Statin medications and encouraged to continue to follow up with Ophthalmology, Podiatrist at least yearly or according to recommendations, and advised to  stay away from smoking. I have recommended yearly flu vaccine and pneumonia vaccination at least every 5 years; moderate intensity exercise for up to 150 minutes weekly; and  sleep for at least 7 hours a day.  - I advised patient to maintain close follow up with Rory Percy, MD for primary care needs.  Follow up plan: -Return in about 4 months (around 09/16/2017) for follow up with pre-visit labs. - Time spent with the patient: 25 min, of which >50% was spent in reviewing his sugar logs , discussing his hypo- and hyper-glycemic episodes, reviewing his current and  previous labs and insulin doses and developing a plan to avoid hypo- and hyper-glycemia.   Glade Lloyd, MD Phone: [phone]  Fax: [phone]   -  This note  was partially dictated with voice recognition software. Similar sounding words can be transcribed inadequately or may  not  be corrected upon review.  05/19/2017, 9:48 AM

## 2017-05-19 NOTE — Patient Instructions (Signed)

## 2017-07-05 ENCOUNTER — Other Ambulatory Visit: Payer: Self-pay | Admitting: "Endocrinology

## 2017-08-10 ENCOUNTER — Telehealth: Payer: Self-pay | Admitting: General Surgery

## 2017-08-10 NOTE — Telephone Encounter (Addendum)
[facility]   Checking on patient to see if he had received his CT chest from his PCP. He had this done in [place] by his PCP and everything looked good.   He will follow up with [place] April with me CT a/p with IV and oral contrast for surveillance.  Curlene Labrum, MD [facility] [address] [place], [place] [address] [phone] (office)

## 2017-08-18 ENCOUNTER — Other Ambulatory Visit: Payer: Self-pay | Admitting: Dermatology

## 2017-09-10 LAB — MICROALBUMIN / CREATININE URINE RATIO
Creatinine, Urine: 266 mg/dL (ref 20–320)
MICROALB/CREAT RATIO: 27 ug/mg{creat} (ref <30)
Microalb, Ur: 7.3 mg/dL

## 2017-09-10 LAB — LIPID PANEL
CHOLESTEROL: 162 mg/dL (ref <200)
HDL: 35 mg/dL — ABNORMAL LOW (ref >40)
LDL CHOLESTEROL (CALC): 94 mg/dL
Non-HDL Cholesterol (Calc): 127 mg/dL (calc) (ref <130)
Total CHOL/HDL Ratio: 4.6 (calc) (ref <5.0)
Triglycerides: 240 mg/dL — ABNORMAL HIGH (ref <150)

## 2017-09-10 LAB — COMPLETE METABOLIC PANEL WITH GFR
AG RATIO: 2 (calc) (ref 1.0–2.5)
ALBUMIN MSPROF: 4.4 g/dL (ref 3.6–5.1)
ALKALINE PHOSPHATASE (APISO): 69 U/L (ref 40–115)
ALT: 25 U/L (ref 9–46)
AST: 26 U/L (ref 10–35)
BUN / CREAT RATIO: 13 (calc) (ref 6–22)
BUN: 20 mg/dL (ref 7–25)
CO2: 28 mmol/L (ref 20–32)
Calcium: 9.7 mg/dL (ref 8.6–10.3)
Chloride: 105 mmol/L (ref 98–110)
Creat: 1.5 mg/dL — ABNORMAL HIGH (ref 0.70–1.25)
GFR, EST AFRICAN AMERICAN: 56 mL/min/{1.73_m2} — AB (ref >=60)
GFR, Est Non African American: 49 mL/min/{1.73_m2} — ABNORMAL LOW (ref >=60)
GLOBULIN: 2.2 g/dL (ref 1.9–3.7)
GLUCOSE: 138 mg/dL — AB (ref 65–99)
POTASSIUM: 4.1 mmol/L (ref 3.5–5.3)
SODIUM: 139 mmol/L (ref 135–146)
TOTAL PROTEIN: 6.6 g/dL (ref 6.1–8.1)
Total Bilirubin: 1.9 mg/dL — ABNORMAL HIGH (ref 0.2–1.2)

## 2017-09-10 LAB — HEMOGLOBIN A1C
Hgb A1c MFr Bld: 6.7 % of total Hgb — ABNORMAL HIGH (ref <5.7)
Mean Plasma Glucose: 146 (calc)
eAG (mmol/L): 8.1 (calc)

## 2017-09-10 LAB — TSH: TSH: 1.85 mIU/L (ref 0.40–4.50)

## 2017-09-10 LAB — T4, FREE: FREE T4: 1.2 ng/dL (ref 0.8–1.8)

## 2017-09-10 LAB — VITAMIN D 25 HYDROXY (VIT D DEFICIENCY, FRACTURES): Vit D, 25-Hydroxy: 30 ng/mL (ref 30–100)

## 2017-09-16 ENCOUNTER — Ambulatory Visit: Payer: BC Managed Care – PPO | Admitting: "Endocrinology

## 2017-09-16 ENCOUNTER — Encounter: Payer: Self-pay | Admitting: "Endocrinology

## 2017-09-16 VITALS — BP 125/75 | HR 53 | Ht 74.0 in | Wt 221.0 lb

## 2017-09-16 DIAGNOSIS — N183 Chronic kidney disease, stage 3 unspecified: Secondary | ICD-10-CM

## 2017-09-16 DIAGNOSIS — E1122 Type 2 diabetes mellitus with diabetic chronic kidney disease: Secondary | ICD-10-CM | POA: Diagnosis not present

## 2017-09-16 DIAGNOSIS — E119 Type 2 diabetes mellitus without complications: Secondary | ICD-10-CM | POA: Insufficient documentation

## 2017-09-16 DIAGNOSIS — E782 Mixed hyperlipidemia: Secondary | ICD-10-CM | POA: Diagnosis not present

## 2017-09-16 DIAGNOSIS — E1159 Type 2 diabetes mellitus with other circulatory complications: Secondary | ICD-10-CM

## 2017-09-16 DIAGNOSIS — I10 Essential (primary) hypertension: Secondary | ICD-10-CM | POA: Diagnosis not present

## 2017-09-16 NOTE — Patient Instructions (Signed)

## 2017-09-16 NOTE — Progress Notes (Signed)
Subjective:    Patient ID: [name] [name], male    DOB: Nov 23, 1950, PCP Rory Percy, MD   Past Medical History:  Diagnosis Date  . Anxiety   . 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 Arthritis   . Coronary artery disease cardiologist-  dr Domenic Polite   hx NSTEMI 04-19-2013--- Multivessel CAD /  04-25-2013 s/p post CABG x4,  (LIMA-LAD, SVG-D1, SVG-D2, SVG-OM)  . Essential hypertension, benign   . First degree heart block   . GERD (gastroesophageal reflux disease)   . Gout    as of 04-07-2017-- per pt/ wife stable  . History of kidney stones   . History of non-ST elevation myocardial infarction (NSTEMI) 04/19/2013   s/p  cabg  . History of peptic ulcer disease dx 1990s  . Hyperlipidemia   . Left ureteral stone   . Nocturia   . Nodule of right lung    per CT 04-04-2017  right lower lobe multiple noodules  . OSA on CPAP   . Renal cyst, right    per CT 04-04-2017  . Renal insufficiency   . S/P CABG x 4 04/25/2013   LIMA to LAD;  SVG to OD;  SVG to D2;  SVG to OM  . Type 2 diabetes mellitus ([place])    last A1c 6.8 on 01-02-2017  . Wears glasses    Past Surgical History:  Procedure Laterality Date  . CARDIOVASCULAR STRESS TEST  11-13-2016   dr Domenic Polite   Intermediate risk nuclear study w/ medium defect of moderate severity in the basal inferoseptal, basal inferior, mid inferoseptal and mid inferior location (findings consistant w/ prior myocardial infarction)/  mild enlarged LV cavity size;  nuclear stress EF 45%  . COLONOSCOPY N/A 04/13/2017   Procedure: COLONOSCOPY;  Surgeon: Aviva Signs, MD;  Location: [facility];  Service: Gastroenterology;  Laterality: N/A;  . CORONARY ARTERY BYPASS GRAFT N/A 04/25/2013   Procedure: CORONARY ARTERY BYPASS GRAFTING (CABG);  Surgeon: [name], MD;  Location: [facility];  Service: Open Heart Surgery;  Laterality: N/A;  Coronary artery bypass graft times four, on pump, using left internal mammary artery  and right greater saphenous vein via endovein harvest.  . CYSTOSCOPY WITH RETROGRADE PYELOGRAM, URETEROSCOPY AND STENT PLACEMENT Left 04/08/2017   Procedure: CYSTOSCOPY WITH RETROGRADE PYELOGRAM, URETEROSCOPY,STENT PLACEMENT;  Surgeon: Lucas Mallow, MD;  Location[facility];  Service: Urology;  Laterality: Left;  . CYSTOSCOPY/URETEROSCOPY/HOLMIUM LASER/STENT PLACEMENT Left 05/17/2017   Procedure: CYSTOSCOPY / RETROGRADE PYELOGRAM / URETEROSCOPY / HOLMIUM LASER / STONE BASKETRY / STENT EXCHANGE;  Surgeon: Lucas Mallow, MD;  Location: [place];  Service: Urology;  Laterality: Left;  ONLY NEEDS 45 MIN FOR PROCEDURE  . ELBOW BURSA SURGERY Right 03-10-2017     at [facility]   and removal of spur  . INTRAOPERATIVE TRANSESOPHAGEAL ECHOCARDIOGRAM N/A 04/25/2013   Procedure: INTRAOPERATIVE TRANSESOPHAGEAL ECHOCARDIOGRAM;  Surgeon: [name], MD;  Location: [facility] OR;  Service: Open Heart Surgery;  Laterality: N/A;  . KNEE ARTHROSCOPY Left 10/ 2017  approx.    dr Theda Sers  . LAPAROSCOPIC RIGHT HEMI COLECTOMY Right 04/19/2017   Procedure: OPEN RIGHT HEMI COLECTOMY;  Surgeon: Virl Cagey, MD;  Location: [facility];  Service: General;  Laterality: Right;  . LAPAROSCOPY N/A 04/19/2017   Procedure: LAPAROSCOPY DIAGNOSTIC;  Surgeon: Virl Cagey, MD;  Location: [facility];  Service: General;  Laterality: N/A;  patient knows  to arrive at 6:15  . LEFT HEART CATHETERIZATION WITH CORONARY ANGIOGRAM N/A 04/20/2013   Procedure: LEFT HEART CATHETERIZATION WITH CORONARY ANGIOGRAM;  Surgeon: [name], MD;  Location: [facility] C[facility];  Service: Cardiovascular;  Laterality: N/A;  . NASAL SEPTUM SURGERY  1990s approx  . TONSILLECTOMY  89 (age 3)  . TRANSTHORACIC ECHOCARDIOGRAM  11-17-2016   dr Domenic Polite   mild LVH,  ef 55-60%, LV wall motion & diastolic function indeterminant assessment,  images were inadequate/  mild LAE/  trivial Tr   Social History   Socioeconomic History   . Marital status: Married    Spouse name: None  . Number of children: None  . Years of education: None  . Highest education level: None  Social Needs  . Financial resource strain: None  . Food insecurity - worry: None  . Food insecurity - inability: None  . Transportation needs - medical: None  . Transportation needs - non-medical: None  Occupational History  . Occupation: Drives Chief Operating Officer: [employer]  Tobacco Use  . Smoking status: Never Smoker  . Smokeless tobacco: Never Used  Substance and Sexual Activity  . Alcohol use: No    Alcohol/week: 0.0 oz  . Drug use: No  . Sexual activity: Yes    Birth control/protection: None  Other Topics Concern  . None  Social History Narrative   Lives with wife, does not exercise but part of his job is strenuous. Works in a body-shop part-time, too.   2 of his mother's sisters and 1 brother died suddenly (family was told MI but no autopsy).    Outpatient Encounter Medications as of 09/16/2017  Medication Sig  . acetaminophen (TYLENOL) 500 MG tablet Take 2 tablets (1,000 mg total) by mouth every 6 (six) hours as needed for mild pain.  . Ascorbic Acid (VITAMIN C) 1000 MG tablet Take 1,000 mg by mouth daily.  Marland Kitchen aspirin EC 325 MG EC tablet Take 1 tablet (325 mg total) by mouth daily. (Patient taking differently: Take 325 mg by mouth every morning. )  . colchicine 0.6 MG tablet Take 0.6 mg by mouth 2 (two) times daily as needed.   . docusate sodium (COLACE) 100 MG capsule Take 1 capsule (100 mg total) by mouth 2 (two) times daily.  Marland Kitchen escitalopram (LEXAPRO) 10 MG tablet Take 10 mg by mouth at bedtime.   Marland Kitchen lisinopril (PRINIVIL,ZESTRIL) 10 MG tablet Take 5 mg by mouth every morning.   . lovastatin (MEVACOR) 20 MG tablet TAKE ONE TABLET BY MOUTH AT BEDTIME, STOP TAKING ATORVASTATIN (Patient taking differently: Take 20 mg by mouth at bedtime. TAKE ONE TABLET BY MOUTH AT BEDTIME, STOP TAKING ATORVASTATIN)  . metFORMIN (GLUCOPHAGE) 500 MG  tablet Take 2 (two) times daily with a meal by mouth.  . metoprolol tartrate (LOPRESSOR) 25 MG tablet Take 0.5 tablets (12.5 mg total) by mouth 2 (two) times daily. (Patient taking differently: Take 12.5 mg by mouth 2 (two) times daily. )  . Multiple Vitamin (MULTIVITAMIN WITH MINERALS) TABS tablet Take 1 tablet by mouth daily.  . Omega-3 Fatty Acids (FISH OIL) 1000 MG CPDR Take 1 capsule by mouth daily.  Marland Kitchen omeprazole (PRILOSEC) 20 MG capsule Take 20 mg by mouth every morning.   . pyridoxine (B-6) 200 MG tablet Take 200 mg by mouth daily.  . [DISCONTINUED] ibuprofen (ADVIL,MOTRIN) 800 MG tablet Take 1 tablet (800 mg total) by mouth every 6 (six) hours as needed for headache or moderate pain.  . [  DISCONTINUED] metFORMIN (GLUCOPHAGE) 500 MG tablet TAKE 1 TABLET TWICE A DAY  . [DISCONTINUED] oxybutynin (DITROPAN) 5 MG tablet Take 5 mg by mouth every 8 (eight) hours as needed. For bladder spasms  . [DISCONTINUED] oxyCODONE (OXY IR/ROXICODONE) 5 MG immediate release tablet Take 1 tablet (5 mg total) by mouth every 4 (four) hours as needed for moderate pain or severe pain.  . [DISCONTINUED] tamsulosin (FLOMAX) 0.4 MG CAPS capsule Take 0.4 mg by mouth daily after supper.    No facility-administered encounter medications on file as of 09/16/2017.    ALLERGIES: No Known Allergies VACCINATION STATUS: Immunization History  Administered Date(s) Administered  . Influenza Split 02/10/2014  . Influenza,inj,Quad PF,6+ Mos 04/20/2017    Diabetes  He presents for his follow-up diabetic visit. He has type 2 diabetes mellitus. His disease course has been worsening. There are no hypoglycemic associated symptoms. Pertinent negatives for hypoglycemia include no confusion, headaches, pallor or seizures. There are no diabetic associated symptoms. Pertinent negatives for diabetes include no chest pain, no fatigue, no polydipsia, no polyphagia, no polyuria and no weakness. There are no hypoglycemic complications.  Symptoms are stable. Diabetic complications include heart disease. Risk factors for coronary artery disease include diabetes mellitus, dyslipidemia, hypertension, male sex and sedentary lifestyle. He is compliant with treatment most of the time. His weight is increasing steadily. He is following a generally unhealthy diet. When asked about meal planning, he reported none. He never participates in exercise.  Hyperlipidemia  This is a chronic problem. The current episode started more than 1 year ago. Exacerbating diseases include diabetes. Pertinent negatives include no chest pain, myalgias or shortness of breath. Current antihyperlipidemic treatment includes statins. Risk factors for coronary artery disease include diabetes mellitus, dyslipidemia, hypertension, male sex and a sedentary lifestyle.  Hypertension  This is a chronic problem. The current episode started more than 1 year ago. The problem is controlled. Pertinent negatives include no chest pain, headaches, neck pain, palpitations or shortness of breath. Risk factors for coronary artery disease include dyslipidemia, diabetes mellitus, male gender and sedentary lifestyle. Past treatments include beta blockers and ACE inhibitors.    Review of Systems  Constitutional: Negative for chills, fatigue, fever and unexpected weight change.  HENT: Negative for dental problem, mouth sores and trouble swallowing.   Eyes: Negative for visual disturbance.  Respiratory: Negative for cough, choking, chest tightness, shortness of breath and wheezing.   Cardiovascular: Negative for chest pain, palpitations and leg swelling.  Gastrointestinal: Negative for abdominal distention, abdominal pain, constipation, diarrhea, nausea and vomiting.  Endocrine: Negative for polydipsia, polyphagia and polyuria.  Genitourinary: Negative for dysuria, flank pain, hematuria and urgency.  Musculoskeletal: Negative for back pain, gait problem, myalgias and neck pain.  Skin:  Negative for pallor, rash and wound.  Neurological: Negative for seizures, syncope, weakness, numbness and headaches.  Psychiatric/Behavioral: Negative.  Negative for confusion and dysphoric mood.    Objective:    BP 125/75   Pulse (!) 53   Ht 6\' 2"  (1.88 m)   Wt 221 lb (100.2 kg)   BMI 28.37 kg/m   Wt Readings from Last 3 Encounters:  09/16/17 221 lb (100.2 kg)  05/19/17 210 lb (95.3 kg)  05/17/17 209 lb (94.8 kg)    Physical Exam  Constitutional: He is oriented to person, place, and time. He appears well-developed and well-nourished. He is cooperative. No distress.  HENT:  Head: Normocephalic and atraumatic.  Eyes: EOM are normal.  Neck: Normal range of motion. Neck supple. No tracheal deviation  present. No thyromegaly present.  Cardiovascular: Normal rate, S1 normal, S2 normal and normal heart sounds. Exam reveals no gallop.  No murmur heard. Pulses:      Dorsalis pedis pulses are 1+ on the right side, and 1+ on the left side.       Posterior tibial pulses are 1+ on the right side, and 1+ on the left side.  Pulmonary/Chest: Breath sounds normal. No respiratory distress. He has no wheezes.  Abdominal: Soft. Bowel sounds are normal. He exhibits no distension. There is no tenderness. There is no guarding and no CVA tenderness.  Musculoskeletal: He exhibits no edema.       Right shoulder: He exhibits no swelling and no deformity.  Neurological: He is alert and oriented to person, place, and time. He has normal strength and normal reflexes. No cranial nerve deficit or sensory deficit. Gait normal.  Skin: Skin is warm and dry. No rash noted. No cyanosis. Nails show no clubbing.  Psychiatric: He has a normal mood and affect. His speech is normal and behavior is normal. Judgment and thought content normal. Cognition and memory are normal.     CMP     Component Value Date/Time   NA 139 09/09/2017 0815   K 4.1 09/09/2017 0815   CL 105 09/09/2017 0815   CO2 28 09/09/2017 0815    GLUCOSE 138 (H) 09/09/2017 0815   BUN 20 09/09/2017 0815   CREATININE 1.50 (H) 09/09/2017 0815   CALCIUM 9.7 09/09/2017 0815   PROT 6.6 09/09/2017 0815   ALBUMIN 3.8 04/04/2017 1134   AST 26 09/09/2017 0815   ALT 25 09/09/2017 0815   ALKPHOS 102 04/04/2017 1134   BILITOT 1.9 (H) 09/09/2017 0815   GFRNONAA 49 (L) 09/09/2017 0815   GFRAA 56 (L) 09/09/2017 0815     Diabetic Labs (most recent): Lab Results  Component Value Date   HGBA1C 6.7 (H) 09/09/2017   HGBA1C 6.1 (H) 05/10/2017   HGBA1C 6.8 (H) 01/02/2017     Lipid Panel ( most recent) Lipid Panel     Component Value Date/Time   CHOL 162 09/09/2017 0815   TRIG 240 (H) 09/09/2017 0815   HDL 35 (L) 09/09/2017 0815   CHOLHDL 4.6 09/09/2017 0815   VLDL 21 07/02/2016 0853   LDLCALC 69 07/02/2016 0853     Assessment & Plan:   1. Type 2 diabetes mellitus with other circulatory complications , and acute on chronic renal insufficiency   His diabetes is  complicated by coronary artery disease status post coronary artery bypass graft , acute on chronic renal insufficiency and patient remains at a high risk for more acute and chronic complications of diabetes which include CAD, CVA, CKD, retinopathy, and neuropathy. These are all discussed in detail with the patient.  Patient came with increasing A1c of 6.7% from 6.1%.    - Recent labs reviewed.  - I have re-counseled the patient on diet management  by adopting a carbohydrate restricted / protein rich  Diet.  -  Suggestion is made for him to avoid simple carbohydrates  from his diet including Cakes, Sweet Desserts / Pastries, Ice Cream, Soda (diet and regular), Sweet Tea, Candies, Chips, Cookies, Store Bought Juices, Alcohol in Excess of  1-2 drinks a day, Artificial Sweeteners, and "Sugar-free" Products. This will help patient to have stable blood glucose profile and potentially avoid unintended weight gain.  - Patient is advised to stick to a routine mealtimes to eat 3 meals   a day and avoid  unnecessary snacks ( to snack only to correct hypoglycemia).  - I have approached patient with the following individualized plan to manage diabetes and patient agrees.  -His GFR is 49, still favorable for utilization of metformin.   -I advised him to continue metformin 500 mg p.o. twice daily after meals.   -His recent labs show increased serum creatinine and dropping GFR, patient has history of heavy consumption of ibuprofen for arthritis.  - I have advised patient to keep adequate hydration and will repeat his renal function before his next visit.  - Patient will be considered for incretin therapy as appropriate next visit if he loses control. - Patient specific target  for A1c; LDL, HDL, Triglycerides, and  Waist Circumference were discussed in detail.  2) BP/HTN: His blood pressure is controlled to target.  I have advised him to continue his medications including metoprolol and lisinopril.    3) Lipids/HPL: Recent lipid panel showed LDL of 94.  He is advised to continue lovastatin 20 mg p.o. nightly.   4)  Weight/Diet:  He is currently gaining weight, and 15+ pounds since last visit.     He is advised to resume  exercise, and carbohydrates information provided.  5) Chronic Care/Health Maintenance:  -Patient is on  Statin medications and encouraged to continue to follow up with Ophthalmology, Podiatrist at least yearly or according to recommendations, and advised to  stay away from smoking. I have recommended yearly flu vaccine and pneumonia vaccination at least every 5 years; moderate intensity exercise for up to 150 minutes weekly; and  sleep for at least 7 hours a day.  - I advised patient to maintain close follow up with Rory Percy, MD for primary care needs.  Follow up plan: -Return in about 6 months (around 03/19/2018) for follow up with pre-visit labs.   - Time spent with the patient: 25 min, of which >50% was spent in reviewing his blood glucose logs ,  discussing his hypo- and hyper-glycemic episodes, reviewing his current and  previous labs and insulin doses and developing a plan to avoid hypo- and hyper-glycemia. Please refer to Patient Instructions for Blood Glucose Monitoring and Insulin/Medications Dosing Guide"  in media tab for additional information.  Glade Lloyd, MD Phone: [phone]  Fax: [phone]   -  This note was partially dictated with voice recognition software. Similar sounding words can be transcribed inadequately or may not  be corrected upon review.  09/16/2017, 11:31 AM

## 2017-09-24 ENCOUNTER — Other Ambulatory Visit: Payer: Self-pay | Admitting: "Endocrinology

## 2017-10-27 NOTE — Progress Notes (Signed)
Cardiology Office Note  Date: 10/28/2017   ID: [name] [name], DOB 1951-05-16, MRN [id number]  PCP: Rory Percy, MD  Primary Cardiologist: [name], MD   Chief Complaint  Patient presents with  . Coronary Artery Disease    History of Present Illness: [name] [name] is a 67 y.o. male last seen in April 2018.  He presents for a follow-up visit.  He has done well from a cardiac perspective, reports no angina on current medical regimen and stable NYHA class II dyspnea.  He continues to work at a [employer].  Follow-up Myoview in May 2018 revealed evidence of scar in the inferior/inferoseptal distribution with LVEF 45%.  Echocardiogram at that time demonstrated LVEF 55 to 60% however.  Current cardiac regimen includes aspirin, lisinopril, Mevacor, and Lopressor.  I reviewed his most recent lab work which is outlined below.  LDL was 94 on Mevacor.  I reviewed his interval ECG from October 2018.  Past Medical History:  Diagnosis Date  . Anxiety   . 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 Arthritis   . Coronary artery disease cardiologist-  dr Domenic Polite   hx NSTEMI 04-19-2013--- Multivessel CAD /  04-25-2013 s/p post CABG x4,  (LIMA-LAD, SVG-D1, SVG-D2, SVG-OM)  . Essential hypertension, benign   . First degree heart block   . GERD (gastroesophageal reflux disease)   . Gout    as of 04-07-2017-- per pt/ wife stable  . History of kidney stones   . History of non-ST elevation myocardial infarction (NSTEMI) 04/19/2013   s/p  cabg  . History of peptic ulcer disease dx 1990s  . Hyperlipidemia   . Left ureteral stone   . Nocturia   . Nodule of right lung    per CT 04-04-2017  right lower lobe multiple noodules  . OSA on CPAP   . Renal cyst, right    per CT 04-04-2017  . Renal insufficiency   . S/P CABG x 4 04/25/2013   LIMA to LAD;  SVG to OD;  SVG to D2;  SVG to OM  . Type 2 diabetes mellitus ([place])    last A1c 6.8 on  01-02-2017  . Wears glasses     Past Surgical History:  Procedure Laterality Date  . CARDIOVASCULAR STRESS TEST  11-13-2016   dr Domenic Polite   Intermediate risk nuclear study w/ medium defect of moderate severity in the basal inferoseptal, basal inferior, mid inferoseptal and mid inferior location (findings consistant w/ prior myocardial infarction)/  mild enlarged LV cavity size;  nuclear stress EF 45%  . COLONOSCOPY N/A 04/13/2017   Procedure: COLONOSCOPY;  Surgeon: Aviva Signs, MD;  [facility];  Service: Gastroenterology;  Laterality: N/A;  . CORONARY ARTERY BYPASS GRAFT N/A 04/25/2013   Procedure: CORONARY ARTERY BYPASS GRAFTING (CABG);  Surgeon: [name], MD;  Location: [place];  Service: Open Heart Surgery;  Laterality: N/A;  Coronary artery bypass graft times four, on pump, using left internal mammary artery and right greater saphenous vein via endovein harvest.  . CYSTOSCOPY WITH RETROGRADE PYELOGRAM, URETEROSCOPY AND STENT PLACEMENT Left 04/08/2017   Procedure: CYSTOSCOPY WITH RETROGRADE PYELOGRAM, URETEROSCOPY,STENT PLACEMENT;  Surgeon: Lucas Mallow, MD;  Location[facility];  Service: Urology;  Laterality: Left;  . CYSTOSCOPY/URETEROSCOPY/HOLMIUM LASER/STENT PLACEMENT Left 05/17/2017   Procedure: CYSTOSCOPY / RETROGRADE PYELOGRAM / URETEROSCOPY / HOLMIUM LASER / STONE BASKETRY / STENT EXCHANGE;  Surgeon: Link Snuffer  D III, MD;  Location: [place];  Service: Urology;  Laterality: Left;  ONLY NEEDS 45 MIN FOR PROCEDURE  . ELBOW BURSA SURGERY Right 03-10-2017     at [facility]   and removal of spur  . INTRAOPERATIVE TRANSESOPHAGEAL ECHOCARDIOGRAM N/A 04/25/2013   Procedure: INTRAOPERATIVE TRANSESOPHAGEAL ECHOCARDIOGRAM;  Surgeon: [name], MD;  Location[facility] OR;  Service: Open Heart Surgery;  Laterality: N/A;  . KNEE ARTHROSCOPY Left 10/ 2017  approx.    dr Theda Sers  . LAPAROSCOPIC RIGHT HEMI COLECTOMY Right 04/19/2017   Procedure: OPEN  RIGHT HEMI COLECTOMY;  Surgeon: Virl Cagey, MD;  Location: [facility];  Service: General;  Laterality: Right;  . LAPAROSCOPY N/A 04/19/2017   Procedure: LAPAROSCOPY DIAGNOSTIC;  Surgeon: Virl Cagey, MD;  Location: [facility];  Service: General;  Laterality: N/A;  patient knows to arrive at 6:15  . LEFT HEART CATHETERIZATION WITH CORONARY ANGIOGRAM N/A 04/20/2013   Procedure: LEFT HEART CATHETERIZATION WITH CORONARY ANGIOGRAM;  Surgeon: [name], MD;  Location: [facility];  Service: Cardiovascular;  Laterality: N/A;  . NASAL SEPTUM SURGERY  1990s approx  . TONSILLECTOMY  61 (age 64)  . TRANSTHORACIC ECHOCARDIOGRAM  11-17-2016   dr Domenic Polite   mild LVH,  ef 55-60%, LV wall motion & diastolic function indeterminant assessment,  images were inadequate/  mild LAE/  trivial Tr    Current Outpatient Medications  Medication Sig Dispense Refill  . acetaminophen (TYLENOL) 500 MG tablet Take 2 tablets (1,000 mg total) by mouth every 6 (six) hours as needed for mild pain. 30 tablet 0  . Ascorbic Acid (VITAMIN C) 1000 MG tablet Take 1,000 mg by mouth daily.    Marland Kitchen aspirin EC 325 MG EC tablet Take 1 tablet (325 mg total) by mouth daily. (Patient taking differently: Take 325 mg by mouth every morning. ) 30 tablet 0  . colchicine 0.6 MG tablet Take 0.6 mg by mouth 2 (two) times daily as needed.     . docusate sodium (COLACE) 100 MG capsule Take 1 capsule (100 mg total) by mouth 2 (two) times daily. 10 capsule 0  . escitalopram (LEXAPRO) 10 MG tablet Take 10 mg by mouth at bedtime.     Marland Kitchen lisinopril (PRINIVIL,ZESTRIL) 10 MG tablet Take 5 mg by mouth every morning.     . lovastatin (MEVACOR) 20 MG tablet TAKE ONE TABLET BY MOUTH AT BEDTIME, STOP TAKING ATORVASTATIN (Patient taking differently: Take 20 mg by mouth at bedtime. TAKE ONE TABLET BY MOUTH AT BEDTIME, STOP TAKING ATORVASTATIN) 90 tablet 3  . metFORMIN (GLUCOPHAGE) 500 MG tablet TAKE 1 TABLET TWICE A DAY 180 tablet 0  . metoprolol tartrate  (LOPRESSOR) 25 MG tablet Take 0.5 tablets (12.5 mg total) by mouth 2 (two) times daily. (Patient taking differently: Take 12.5 mg by mouth 2 (two) times daily. ) 90 tablet 3  . Multiple Vitamin (MULTIVITAMIN WITH MINERALS) TABS tablet Take 1 tablet by mouth daily.    . Omega-3 Fatty Acids (FISH OIL) 1000 MG CPDR Take 1 capsule by mouth daily.    Marland Kitchen omeprazole (PRILOSEC) 20 MG capsule Take 20 mg by mouth every morning.     . pyridoxine (B-6) 200 MG tablet Take 200 mg by mouth daily.     No current facility-administered medications for this visit.    Allergies:  Patient has no known allergies.   Social History: The patient  reports that he has never smoked. He has never used smokeless tobacco. He reports  that he does not drink alcohol or use drugs.   ROS:  Please see the history of present illness. Otherwise, complete review of systems is positive for arthritic pains in his knees.  All other systems are reviewed and negative.   Physical Exam: VS:  BP 129/77   Pulse (!) 59   Ht 6\' 2"  (1.88 m)   Wt 221 lb 9.6 oz (100.5 kg)   SpO2 97%   BMI 28.45 kg/m , BMI Body mass index is 28.45 kg/m.  Wt Readings from Last 3 Encounters:  10/28/17 221 lb 9.6 oz (100.5 kg)  09/16/17 221 lb (100.2 kg)  05/19/17 210 lb (95.3 kg)    General: Patient appears comfortable at rest. HEENT: Conjunctiva and lids normal, oropharynx clear. Neck: Supple, no elevated JVP or carotid bruits, no thyromegaly. Lungs: Clear to auscultation, nonlabored breathing at rest. Cardiac: Regular rate and rhythm, no S3 or significant systolic murmur, no pericardial rub. Abdomen: Soft, nontender, bowel sounds present. Extremities: No pitting edema, distal pulses 2+. Skin: Warm and dry. Musculoskeletal: No kyphosis. Neuropsychiatric: Alert and oriented x3, affect grossly appropriate.  ECG: I personally reviewed the tracing from 04/14/2017 which showed sinus rhythm with prolonged QT interval.  Recent Labwork: 04/21/2017:  Magnesium 1.9; Platelets 191 05/17/2017: Hemoglobin 13.9 09/09/2017: ALT 25; AST 26; BUN 20; Creat 1.50; Potassium 4.1; Sodium 139; TSH 1.85     Component Value Date/Time   CHOL 162 09/09/2017 0815   TRIG 240 (H) 09/09/2017 0815   HDL 35 (L) 09/09/2017 0815   CHOLHDL 4.6 09/09/2017 0815   VLDL 21 07/02/2016 0853   LDLCALC 94 09/09/2017 0815    Other Studies Reviewed Today:  [facility] Adam Myoview 11/13/2016:  There was no ST segment deviation noted during stress.  Defect 1: There is a medium defect of moderate severity present in the basal inferoseptal, basal inferior, mid inferoseptal and mid inferior location.  Findings consistent with prior myocardial infarction.  This is an intermediate risk study.  Nuclear stress EF: 45%.  Echocardiogram 11/17/2016: Study Conclusions  - Left ventricle: The cavity size was normal. Wall thickness was   increased in a pattern of mild LVH. Systolic function was normal.   The estimated ejection fraction was in the range of 55% to 60%. - Aortic valve: Mildly calcified annulus. Mildly thickened, mildly   calcified leaflets. Valve area (VTI): 3.22 cm^2. Valve area   (Vmax): 3.3 cm^2. - Left atrium: The atrium was mildly dilated. - Technically adequate study.  Assessment and Plan:  1.  Multivessel CAD status post CABG in 2014.  He is stable at this time without active angina on medical therapy.  Follow-up Myoview from May of last year demonstrated region of inferior scar but no active ischemia and LVEF was 55-60% by echocardiogram.  No changes made in present regimen.  2.  Essential hypertension, pressure control is adequate.  No changes made.  Keep follow-up with Dr. Nadara Mustard.  3.  Mixed hyperlipidemia, continues on Mevacor.  Most recent LDL higher than at last check.  He reports compliance with his medications.  We discussed diet and exercise.  4.  OSA on CPAP.  Current medicines were reviewed with the patient today.  Disposition: Follow-up in 1  year.  Signed, [name], MD, [facility] 10/28/2017 9:29 AM    [facility] [place], [place], [place] [address] Phone: [phone]; Fax: [phone]

## 2017-10-28 ENCOUNTER — Ambulatory Visit: Payer: BC Managed Care – PPO | Admitting: Cardiology

## 2017-10-28 ENCOUNTER — Encounter: Payer: Self-pay | Admitting: Cardiology

## 2017-10-28 VITALS — BP 129/77 | HR 59 | Ht 74.0 in | Wt 221.6 lb

## 2017-10-28 DIAGNOSIS — Z9989 Dependence on other enabling machines and devices: Secondary | ICD-10-CM | POA: Diagnosis not present

## 2017-10-28 DIAGNOSIS — E782 Mixed hyperlipidemia: Secondary | ICD-10-CM

## 2017-10-28 DIAGNOSIS — G4733 Obstructive sleep apnea (adult) (pediatric): Secondary | ICD-10-CM

## 2017-10-28 DIAGNOSIS — I25119 Atherosclerotic heart disease of native coronary artery with unspecified angina pectoris: Secondary | ICD-10-CM | POA: Diagnosis not present

## 2017-10-28 DIAGNOSIS — I10 Essential (primary) hypertension: Secondary | ICD-10-CM

## 2017-10-28 NOTE — Patient Instructions (Signed)
Medication Instructions:  Your physician recommends that you continue on your current medications as directed. Please refer to the Current Medication list given to you today.   Labwork: NONE   Testing/Procedures: NONE  Follow-Up: Your physician wants you to follow-up in: 1 Year with Dr. McDowell. You will receive a reminder letter in the mail two months in advance. If you don't receive a letter, please call our office to schedule the follow-up appointment.   Any Other Special Instructions Will Be Listed Below (If Applicable).     If you need a refill on your cardiac medications before your next appointment, please call your pharmacy.  Thank you for choosing [facility]!   

## 2017-12-16 ENCOUNTER — Other Ambulatory Visit: Payer: Self-pay | Admitting: *Deleted

## 2017-12-16 MED ORDER — METOPROLOL TARTRATE 25 MG PO TABS: 12.5000 mg | ORAL_TABLET | Freq: Two times a day (BID) | ORAL | 3 refills | Status: DC

## 2017-12-16 MED ORDER — LOVASTATIN 20 MG PO TABS
ORAL_TABLET | ORAL | 3 refills | Status: DC
Start: 2017-12-16 — End: 2019-02-07

## 2017-12-19 ENCOUNTER — Other Ambulatory Visit: Payer: Self-pay | Admitting: "Endocrinology

## 2017-12-22 ENCOUNTER — Telehealth: Payer: Self-pay | Admitting: General Surgery

## 2017-12-22 NOTE — Telephone Encounter (Addendum)
[facility]  Patient needing repeat CT a/p with oral and IV contrast. Will have him see me in the office and then will get him scheduled.   LOW GRADE APPENDICEAL MUCINOUS NEOPLASM that needs to be followed every 4-6 months with CT scans.   Left messages on wife and patient's phone.  Curlene Labrum, MD [facility] [address] [place], [place] [address] [phone] (office)

## 2018-02-01 ENCOUNTER — Ambulatory Visit: Payer: Medicare Other | Admitting: General Surgery

## 2018-02-08 ENCOUNTER — Encounter: Payer: Self-pay | Admitting: General Surgery

## 2018-02-08 ENCOUNTER — Ambulatory Visit: Payer: Medicare Other | Admitting: General Surgery

## 2018-02-08 VITALS — BP 149/71 | HR 59 | Temp 97.8°F | Resp 20 | Wt 222.0 lb

## 2018-02-08 DIAGNOSIS — R918 Other nonspecific abnormal finding of lung field: Secondary | ICD-10-CM | POA: Diagnosis not present

## 2018-02-08 DIAGNOSIS — D373 Neoplasm of uncertain behavior of appendix: Secondary | ICD-10-CM

## 2018-02-08 NOTE — Patient Instructions (Addendum)
CT abdomen and pelvis for surveillance given the low grade neoplasm of the appendix.  CT chest to look at the lung nodules seen on the last CT as surveillance recommendation.

## 2018-02-08 NOTE — Progress Notes (Signed)
[facility] Note   HPI:  67 y.o. 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 He reports he is doing well. He says he is eating and having regular Bms. He has no increased abdominal girth and no abdominal pain or complaints. He does feel a possible bulge right of midline.   Review of Systems:  Good appetite Regular BMs No chest pain or SOB Bulge at incision?  No cough or breathing issues  All other review of systems: otherwise negative   Family History  Problem Relation Age of Onset  . CAD Mother   . CAD Father   . Lung cancer Father   . Colon cancer Father     Vital Signs:  BP (!) 149/71 (BP Location: Left Arm, Patient Position: Sitting, Cuff Size: Normal)   Pulse (!) 59   Temp 97.8 F (36.6 C) (Temporal)   Resp 20   Wt 222 lb (100.7 kg)   BMI 28.50 kg/m    Physical Exam:  Physical Exam  Constitutional: He is oriented to person, place, and time. He appears well-developed.  HENT:  Head: Normocephalic.  Eyes: Pupils are equal, round, and reactive to light.  Neck: Normal range of motion.  Cardiovascular: Normal rate and regular rhythm.  Pulmonary/Chest: Effort normal and breath sounds normal.  Abdominal: Soft. He exhibits no distension. There is no tenderness.  Possible bulge right of midline, fascia edge difficult to appreciate   Musculoskeletal: Normal range of motion. He exhibits no edema.  Neurological: He is alert and oriented to person, place, and time.  Skin: Skin is warm and dry.  Vitals reviewed.   Laboratory studies: Reviewed prior CT a/p report, and noted no CT chest ever performed for the lung nodules on the right.   Imaging:  None    Assessment:  67 y.o. 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 At the same time he also had lung nodules that were seen on his CT a/p. He was suppose to get a CT chest from  his PCP but he says that he has not seen the PCP for months. He is already due for his surveillance CT to assess his abdomen for any concerning issues related to his mucinous neoplasm history.  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  Greater than 50% of the 15 minute visit was spent in counseling/ coordination of care regarding the patient's mucinous neoplasm of the appendix and the right lower lobe nodules.   Curlene Labrum, MD [facility] [address] [place], [place] [address] [phone] (office)

## 2018-02-15 ENCOUNTER — Other Ambulatory Visit: Payer: Self-pay | Admitting: General Surgery

## 2018-02-15 ENCOUNTER — Other Ambulatory Visit: Payer: Self-pay | Admitting: Nurse Practitioner

## 2018-02-15 NOTE — Addendum Note (Signed)
Addended by: Larena Glassman R on: 02/15/2018 12:15 PM   Modules accepted: Orders

## 2018-02-15 NOTE — Addendum Note (Signed)
Addended by: Larena Glassman R on: 02/15/2018 12:41 PM   Modules accepted: Orders

## 2018-02-15 NOTE — Addendum Note (Signed)
Addended by: Luanne Bras on: 02/15/2018 12:24 PM   Modules accepted: Orders

## 2018-03-04 ENCOUNTER — Encounter (HOSPITAL_COMMUNITY): Payer: Self-pay

## 2018-03-04 ENCOUNTER — Ambulatory Visit (HOSPITAL_COMMUNITY)
Admission: RE | Admit: 2018-03-04 | Discharge: 2018-03-04 | Disposition: A | Discharge status: Home / Self Care | Payer: Medicare Other | Source: Ambulatory Visit | Attending: General Surgery | Admitting: General Surgery

## 2018-03-04 ENCOUNTER — Telehealth: Payer: Self-pay | Admitting: General Surgery

## 2018-03-04 DIAGNOSIS — N400 Enlarged prostate without lower urinary tract symptoms: Secondary | ICD-10-CM | POA: Diagnosis not present

## 2018-03-04 DIAGNOSIS — D373 Neoplasm of uncertain behavior of appendix: Secondary | ICD-10-CM | POA: Diagnosis present

## 2018-03-04 DIAGNOSIS — K760 Fatty (change of) liver, not elsewhere classified: Secondary | ICD-10-CM | POA: Diagnosis not present

## 2018-03-04 DIAGNOSIS — R918 Other nonspecific abnormal finding of lung field: Secondary | ICD-10-CM | POA: Diagnosis not present

## 2018-03-04 LAB — POCT I-STAT CREATININE: Creatinine, Ser: 1.5 mg/dL — ABNORMAL HIGH (ref 0.61–1.24)

## 2018-03-04 MED ORDER — IOPAMIDOL (ISOVUE-300) INJECTION 61%
100.0000 mL | Freq: Once | INTRAVENOUS | Status: AC | PRN
  Administered 2018-03-04: 75 mL via INTRAVENOUS

## 2018-03-04 NOTE — Telephone Encounter (Signed)
[facility]  CT without any masses or mucin in the abdomen. Pulmonary nodules stable.  IMPRESSION: 1. Stable bilateral pulmonary nodules. Continued imaging surveillance recommended until stability x5 years confirmed. 2. No new mass, adenopathy, or other acute finding. 3. Fatty liver 4. Prostatic enlargement  Discussed with Dr. Thornton Papas and have discussed with Surgical Oncologist prior, will plan for 6 months CT for 3 years so until 2021 and then annually for 2 years until 2023.  Given that he has had the low grade mucinous adenoma and the lung nodes are multiple. Will plan to continue with CT chest with IV contrast, and the CT a/p with oral and IV contrast.   Next CT February 2020. Called patient to notify. No answer and no voicemail on one phone. Called wife's phone and left a message.   Curlene Labrum, MD [facility] [address] [place], [place] [address] [phone] (office)

## 2018-03-08 ENCOUNTER — Telehealth: Payer: Self-pay | Admitting: Emergency Medicine

## 2018-03-22 ENCOUNTER — Ambulatory Visit: Payer: BC Managed Care – PPO | Admitting: "Endocrinology

## 2018-03-22 ENCOUNTER — Encounter: Payer: Self-pay | Admitting: "Endocrinology

## 2018-04-20 LAB — COMPLETE METABOLIC PANEL WITH GFR
AG Ratio: 2.2 (calc) (ref 1.0–2.5)
ALKALINE PHOSPHATASE (APISO): 71 U/L (ref 40–115)
ALT: 38 U/L (ref 9–46)
AST: 35 U/L (ref 10–35)
Albumin: 4.2 g/dL (ref 3.6–5.1)
BUN/Creatinine Ratio: 12 (calc) (ref 6–22)
BUN: 16 mg/dL (ref 7–25)
CO2: 28 mmol/L (ref 20–32)
CREATININE: 1.33 mg/dL — AB (ref 0.70–1.25)
Calcium: 9.5 mg/dL (ref 8.6–10.3)
Chloride: 105 mmol/L (ref 98–110)
GFR, Est African American: 65 mL/min/{1.73_m2} (ref >=60)
GFR, Est Non African American: 56 mL/min/{1.73_m2} — ABNORMAL LOW (ref >=60)
GLUCOSE: 136 mg/dL — AB (ref 65–99)
Globulin: 1.9 g/dL (calc) (ref 1.9–3.7)
Potassium: 4.4 mmol/L (ref 3.5–5.3)
Sodium: 140 mmol/L (ref 135–146)
Total Bilirubin: 1.1 mg/dL (ref 0.2–1.2)
Total Protein: 6.1 g/dL (ref 6.1–8.1)

## 2018-04-20 LAB — HEMOGLOBIN A1C
Hgb A1c MFr Bld: 7 % of total Hgb — ABNORMAL HIGH (ref <5.7)
Mean Plasma Glucose: 154 (calc)
eAG (mmol/L): 8.5 (calc)

## 2018-04-25 ENCOUNTER — Encounter: Payer: Self-pay | Admitting: "Endocrinology

## 2018-04-25 ENCOUNTER — Ambulatory Visit (INDEPENDENT_AMBULATORY_CARE_PROVIDER_SITE_OTHER): Payer: Medicare Other | Admitting: "Endocrinology

## 2018-04-25 VITALS — BP 132/73 | HR 58 | Ht 74.0 in | Wt 226.0 lb

## 2018-04-25 DIAGNOSIS — I10 Essential (primary) hypertension: Secondary | ICD-10-CM

## 2018-04-25 DIAGNOSIS — E1159 Type 2 diabetes mellitus with other circulatory complications: Secondary | ICD-10-CM

## 2018-04-25 DIAGNOSIS — E1122 Type 2 diabetes mellitus with diabetic chronic kidney disease: Secondary | ICD-10-CM | POA: Diagnosis not present

## 2018-04-25 DIAGNOSIS — E782 Mixed hyperlipidemia: Secondary | ICD-10-CM

## 2018-04-25 DIAGNOSIS — N183 Chronic kidney disease, stage 3 (moderate): Secondary | ICD-10-CM

## 2018-04-25 NOTE — Patient Instructions (Signed)

## 2018-04-25 NOTE — Progress Notes (Signed)
Endocrinology follow-up note    Subjective:    Patient ID: [name] [name], male    DOB: 07/17/1950, PCP Rory Percy, MD   Past Medical History:  Diagnosis Date  . Anxiety   . 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 Arthritis   . Coronary artery disease cardiologist-  dr Domenic Polite   hx NSTEMI 04-19-2013--- Multivessel CAD /  04-25-2013 s/p post CABG x4,  (LIMA-LAD, SVG-D1, SVG-D2, SVG-OM)  . Essential hypertension, benign   . First degree heart block   . GERD (gastroesophageal reflux disease)   . Gout    as of 04-07-2017-- per pt/ wife stable  . History of kidney stones   . History of non-ST elevation myocardial infarction (NSTEMI) 04/19/2013   s/p  cabg  . History of peptic ulcer disease dx 1990s  . Hyperlipidemia   . Left ureteral stone   . Nocturia   . Nodule of right lung    per CT 04-04-2017  right lower lobe multiple noodules  . OSA on CPAP   . Renal cyst, right    per CT 04-04-2017  . Renal insufficiency   . S/P CABG x 4 04/25/2013   LIMA to LAD;  SVG to OD;  SVG to D2;  SVG to OM  . Type 2 diabetes mellitus ([place])    last A1c 6.8 on 01-02-2017  . Wears glasses    Past Surgical History:  Procedure Laterality Date  . CARDIOVASCULAR STRESS TEST  11-13-2016   dr Domenic Polite   Intermediate risk nuclear study w/ medium defect of moderate severity in the basal inferoseptal, basal inferior, mid inferoseptal and mid inferior location (findings consistant w/ prior myocardial infarction)/  mild enlarged LV cavity size;  nuclear stress EF 45%  . COLONOSCOPY N/A 04/13/2017   Procedure: COLONOSCOPY;  Surgeon: Aviva Signs, MD;  Location: [facility];  Service: Gastroenterology;  Laterality: N/A;  . CORONARY ARTERY BYPASS GRAFT N/A 04/25/2013   Procedure: CORONARY ARTERY BYPASS GRAFTING (CABG);  Surgeon: [name], MD;  Location: [facility];  Service: Open Heart Surgery;  Laterality: N/A;  Coronary artery bypass graft times four, on pump,  using left internal mammary artery and right greater saphenous vein via endovein harvest.  . CYSTOSCOPY WITH RETROGRADE PYELOGRAM, URETEROSCOPY AND STENT PLACEMENT Left 04/08/2017   Procedure: CYSTOSCOPY WITH RETROGRADE PYELOGRAM, URETEROSCOPY,STENT PLACEMENT;  Surgeon: Lucas Mallow, MD;  Location: [facility];  Service: Urology;  Laterality: Left;  . CYSTOSCOPY/URETEROSCOPY/HOLMIUM LASER/STENT PLACEMENT Left 05/17/2017   Procedure: CYSTOSCOPY / RETROGRADE PYELOGRAM / URETEROSCOPY / HOLMIUM LASER / STONE BASKETRY / STENT EXCHANGE;  Surgeon: Lucas Mallow, MD;  Location: [facility];  Service: Urology;  Laterality: Left;  ONLY NEEDS 45 MIN FOR PROCEDURE  . ELBOW BURSA SURGERY Right 03-10-2017     at [facility]   and removal of spur  . INTRAOPERATIVE TRANSESOPHAGEAL ECHOCARDIOGRAM N/A 04/25/2013   Procedure: INTRAOPERATIVE TRANSESOPHAGEAL ECHOCARDIOGRAM;  Surgeon: [name], MD;  Location: [facility] OR;  Service: Open Heart Surgery;  Laterality: N/A;  . KNEE ARTHROSCOPY Left 10/ 2017  approx.    dr Theda Sers  . LAPAROSCOPIC RIGHT HEMI COLECTOMY Right 04/19/2017   Procedure: OPEN RIGHT HEMI COLECTOMY;  Surgeon: Virl Cagey, MD;  Location: [facility];  Service: General;  Laterality: Right;  . LAPAROSCOPY N/A 04/19/2017   Procedure: LAPAROSCOPY DIAGNOSTIC;  Surgeon: Virl Cagey, MD;  Location: [facility];  Service: General;  Laterality:  N/A;  patient knows to arrive at 6:15  . LEFT HEART CATHETERIZATION WITH CORONARY ANGIOGRAM N/A 04/20/2013   Procedure: LEFT HEART CATHETERIZATION WITH CORONARY ANGIOGRAM;  Surgeon: [name], MD;  Location: [facility] CATH LAB;  Service: Cardiovascular;  Laterality: N/A;  . NASAL SEPTUM SURGERY  1990s approx  . TONSILLECTOMY  74 (age 87)  . TRANSTHORACIC ECHOCARDIOGRAM  11-17-2016   dr Domenic Polite   mild LVH,  ef 55-60%, LV wall motion & diastolic function indeterminant assessment,  images were inadequate/  mild LAE/  trivial Tr   Social  History   Socioeconomic History  . Marital status: Married    Spouse name: Not on file  . Number of children: Not on file  . Years of education: Not on file  . Highest education level: Not on file  Occupational History  . Occupation: Drives Chief Operating Officer: [employer]  Social Needs  . Financial resource strain: Not on file  . Food insecurity:    Worry: Not on file    Inability: Not on file  . Transportation needs:    Medical: Not on file    Non-medical: Not on file  Tobacco Use  . Smoking status: Never Smoker  . Smokeless tobacco: Never Used  Substance and Sexual Activity  . Alcohol use: No    Alcohol/week: 0.0 standard drinks  . Drug use: No  . Sexual activity: Yes    Birth control/protection: None  Lifestyle  . Physical activity:    Days per week: Not on file    Minutes per session: Not on file  . Stress: Not on file  Relationships  . Social connections:    Talks on phone: Not on file    Gets together: Not on file    Attends religious service: Not on file    Active member of club or organization: Not on file    Attends meetings of clubs or organizations: Not on file    Relationship status: Not on file  Other Topics Concern  . Not on file  Social History Narrative   Lives with wife, does not exercise but part of his job is strenuous. Works in a body-shop part-time, too.   2 of his mother's sisters and 1 brother died suddenly (family was told MI but no autopsy).    Outpatient Encounter Medications as of 04/25/2018  Medication Sig  . acetaminophen (TYLENOL) 500 MG tablet Take 2 tablets (1,000 mg total) by mouth every 6 (six) hours as needed for mild pain. (Patient not taking: Reported on 02/08/2018)  . Ascorbic Acid (VITAMIN C) 1000 MG tablet Take 1,000 mg by mouth daily.  Marland Kitchen aspirin EC 325 MG EC tablet Take 1 tablet (325 mg total) by mouth daily. (Patient taking differently: Take 325 mg by mouth every morning. )  . colchicine 0.6 MG tablet Take 0.6 mg by  mouth 2 (two) times daily as needed.   Marland Kitchen escitalopram (LEXAPRO) 10 MG tablet Take 10 mg by mouth at bedtime.   Marland Kitchen lisinopril (PRINIVIL,ZESTRIL) 10 MG tablet Take 5 mg by mouth every morning.   . lovastatin (MEVACOR) 20 MG tablet TAKE ONE TABLET BY MOUTH AT BEDTIME, STOP TAKING ATORVASTATIN  . metFORMIN (GLUCOPHAGE) 500 MG tablet TAKE 1 TABLET TWICE A DAY  . metoprolol tartrate (LOPRESSOR) 25 MG tablet Take 0.5 tablets (12.5 mg total) by mouth 2 (two) times daily.  . Multiple Vitamin (MULTIVITAMIN WITH MINERALS) TABS tablet Take 1 tablet by mouth daily.  . Omega-3 Fatty Acids (  FISH OIL) 1000 MG CPDR Take 1 capsule by mouth daily.  Marland Kitchen omeprazole (PRILOSEC) 20 MG capsule Take 20 mg by mouth every morning.   . pyridoxine (B-6) 200 MG tablet Take 200 mg by mouth daily.   No facility-administered encounter medications on file as of 04/25/2018.    ALLERGIES: No Known Allergies VACCINATION STATUS: Immunization History  Administered Date(s) Administered  . Influenza Split 02/10/2014  . Influenza,inj,Quad PF,6+ Mos 04/20/2017    Diabetes  He presents for his follow-up diabetic visit. He has type 2 diabetes mellitus. His disease course has been worsening. There are no hypoglycemic associated symptoms. Pertinent negatives for hypoglycemia include no confusion, headaches, pallor or seizures. There are no diabetic associated symptoms. Pertinent negatives for diabetes include no chest pain, no fatigue, no polydipsia, no polyphagia, no polyuria and no weakness. There are no hypoglycemic complications. Symptoms are worsening. Diabetic complications include heart disease. Risk factors for coronary artery disease include diabetes mellitus, dyslipidemia, hypertension, male sex and sedentary lifestyle. He is compliant with treatment most of the time. His weight is increasing steadily. He is following a generally unhealthy diet. When asked about meal planning, he reported none. He never participates in exercise.   Hyperlipidemia  This is a chronic problem. The current episode started more than 1 year ago. Exacerbating diseases include diabetes. Pertinent negatives include no chest pain, myalgias or shortness of breath. Current antihyperlipidemic treatment includes statins. Risk factors for coronary artery disease include diabetes mellitus, dyslipidemia, hypertension, male sex and a sedentary lifestyle.  Hypertension  This is a chronic problem. The current episode started more than 1 year ago. The problem is controlled. Pertinent negatives include no chest pain, headaches, neck pain, palpitations or shortness of breath. Risk factors for coronary artery disease include dyslipidemia, diabetes mellitus, male gender and sedentary lifestyle. Past treatments include beta blockers and ACE inhibitors.    Review of Systems  Constitutional: Negative for chills, fatigue, fever and unexpected weight change.  HENT: Negative for dental problem, mouth sores and trouble swallowing.   Eyes: Negative for visual disturbance.  Respiratory: Negative for cough, choking, chest tightness, shortness of breath and wheezing.   Cardiovascular: Negative for chest pain, palpitations and leg swelling.  Gastrointestinal: Negative for abdominal distention, abdominal pain, constipation, diarrhea, nausea and vomiting.  Endocrine: Negative for polydipsia, polyphagia and polyuria.  Genitourinary: Negative for dysuria, flank pain, hematuria and urgency.  Musculoskeletal: Negative for back pain, gait problem, myalgias and neck pain.  Skin: Negative for pallor, rash and wound.  Neurological: Negative for seizures, syncope, weakness, numbness and headaches.  Psychiatric/Behavioral: Negative.  Negative for confusion and dysphoric mood.    Objective:    BP 132/73   Pulse (!) 58   Ht 6\' 2"  (1.88 m)   Wt 226 lb (102.5 kg)   BMI 29.02 kg/m   Wt Readings from Last 3 Encounters:  04/25/18 226 lb (102.5 kg)  02/08/18 222 lb (100.7 kg)   10/28/17 221 lb 9.6 oz (100.5 kg)    Physical Exam  Constitutional: He is oriented to person, place, and time. He appears well-developed and well-nourished. He is cooperative. No distress.  HENT:  Head: Normocephalic and atraumatic.  Eyes: EOM are normal.  Neck: Normal range of motion. Neck supple. No tracheal deviation present. No thyromegaly present.  Cardiovascular: Normal rate, S1 normal, S2 normal and normal heart sounds. Exam reveals no gallop.  No murmur heard. Pulses:      Dorsalis pedis pulses are 1+ on the right side, and 1+ on  the left side.       Posterior tibial pulses are 1+ on the right side, and 1+ on the left side.  Pulmonary/Chest: Breath sounds normal. No respiratory distress. He has no wheezes.  Abdominal: Soft. Bowel sounds are normal. He exhibits no distension. There is no tenderness. There is no guarding and no CVA tenderness.  Musculoskeletal: He exhibits no edema.       Right shoulder: He exhibits no swelling and no deformity.  Neurological: He is alert and oriented to person, place, and time. He has normal strength and normal reflexes. No cranial nerve deficit or sensory deficit. Gait normal.  Skin: Skin is warm and dry. No rash noted. No cyanosis. Nails show no clubbing.  Psychiatric: He has a normal mood and affect. His speech is normal and behavior is normal. Judgment and thought content normal. Cognition and memory are normal.     CMP     Component Value Date/Time   NA 140 04/19/2018 0808   K 4.4 04/19/2018 0808   CL 105 04/19/2018 0808   CO2 28 04/19/2018 0808   GLUCOSE 136 (H) 04/19/2018 0808   BUN 16 04/19/2018 0808   CREATININE 1.33 (H) 04/19/2018 0808   CALCIUM 9.5 04/19/2018 0808   PROT 6.1 04/19/2018 0808   ALBUMIN 3.8 04/04/2017 1134   AST 35 04/19/2018 0808   ALT 38 04/19/2018 0808   ALKPHOS 102 04/04/2017 1134   BILITOT 1.1 04/19/2018 0808   GFRNONAA 56 (L) 04/19/2018 0808   GFRAA 65 04/19/2018 0808     Diabetic Labs (most  recent): Lab Results  Component Value Date   HGBA1C 7.0 (H) 04/19/2018   HGBA1C 6.7 (H) 09/09/2017   HGBA1C 6.1 (H) 05/10/2017     Lipid Panel ( most recent) Lipid Panel     Component Value Date/Time   CHOL 162 09/09/2017 0815   TRIG 240 (H) 09/09/2017 0815   HDL 35 (L) 09/09/2017 0815   CHOLHDL 4.6 09/09/2017 0815   VLDL 21 07/02/2016 0853   LDLCALC 94 09/09/2017 0815     Assessment & Plan:   1. Type 2 diabetes mellitus with other circulatory complications , and acute on chronic renal insufficiency   His diabetes is  complicated by coronary artery disease status post coronary artery bypass graft , acute on chronic renal insufficiency and patient remains at a high risk for more acute and chronic complications of diabetes which include CAD, CVA, CKD, retinopathy, and neuropathy. These are all discussed in detail with the patient.  -  He came with a1c of 7%, progressively increasing from 6.1%.  - Recent labs reviewed.  - I have re-counseled the patient on diet management  by adopting a carbohydrate restricted / protein rich  Diet.  -He admits to dietary indiscretions including consumption of sweets and sweetened beverages. -  Suggestion is made for him to avoid simple carbohydrates  from his diet including Cakes, Sweet Desserts / Pastries, Ice Cream, Soda (diet and regular), Sweet Tea, Candies, Chips, Cookies, Store Bought Juices, Alcohol in Excess of  1-2 drinks a day, Artificial Sweeteners, and "Sugar-free" Products. This will help patient to have stable blood glucose profile and potentially avoid unintended weight gain.  - Patient is advised to stick to a routine mealtimes to eat 3 meals  a day and avoid unnecessary snacks ( to snack only to correct hypoglycemia).  - I have approached patient with the following individualized plan to manage diabetes and patient agrees.   -He is advised to  continue metformin 500 mg p.o. twice daily-after breakfast and after supper.   -He  will be considered for weekly GLP-1 receptor agonists or basal insulin if he returns with higher than 7% A1c next visit.   - I have advised patient to keep adequate hydration and will repeat his renal function before his next visit.  - Patient specific target  for A1c; LDL, HDL, Triglycerides, and  Waist Circumference were discussed in detail.  2) BP/HTN: His blood pressure is controlled to target.  He is advised to continue his current blood pressure medications including metoprolol and lisinopril.     3) Lipids/HPL: Recent lipid panel showed LDL of 94.  He is advised to continue lovastatin 20 mg p.o. nightly.   4)  Weight/Diet:  He is currently gaining weight, mainly driven by his dietary indiscretions.  He promises to do better on his diet and exercise.    He is advised to resume  exercise, and carbohydrates information provided.  5) Chronic Care/Health Maintenance:  -Patient is on  Statin medications and encouraged to continue to follow up with Ophthalmology, Podiatrist at least yearly or according to recommendations, and advised to  stay away from smoking. I have recommended yearly flu vaccine and pneumonia vaccination at least every 5 years; moderate intensity exercise for up to 150 minutes weekly; and  sleep for at least 7 hours a day.  - I advised patient to maintain close follow up with Rory Percy, MD for primary care needs.  Follow up plan: -Return in about 3 months (around 07/26/2018) for Follow up with Pre-visit Labs.   - Time spent with the patient: 25 min, of which >50% was spent in reviewing his blood glucose logs , discussing his hypo- and hyper-glycemic episodes, reviewing his current and  previous labs and insulin doses and developing a plan to avoid hypo- and hyper-glycemia. Please refer to Patient Instructions for Blood Glucose Monitoring and Insulin/Medications Dosing Guide"  in media tab for additional information. [name] [name] participated in the discussions,  expressed understanding, and voiced agreement with the above plans.  All questions were answered to his satisfaction. he is encouraged to contact clinic should he have any questions or concerns prior to his return visit.  Glade Lloyd, MD Phone: [phone]  Fax: [phone]   -  This note was partially dictated with voice recognition software. Similar sounding words can be transcribed inadequately or may not  be corrected upon review.  04/25/2018, 12:50 PM

## 2018-07-15 NOTE — Telephone Encounter (Signed)
erro  neous encounter

## 2018-07-26 LAB — COMPLETE METABOLIC PANEL WITH GFR
AG Ratio: 1.9 (calc) (ref 1.0–2.5)
ALBUMIN MSPROF: 4.1 g/dL (ref 3.6–5.1)
ALT: 29 U/L (ref 9–46)
AST: 27 U/L (ref 10–35)
Alkaline phosphatase (APISO): 100 U/L (ref 40–115)
BUN: 15 mg/dL (ref 7–25)
CALCIUM: 9.4 mg/dL (ref 8.6–10.3)
CO2: 25 mmol/L (ref 20–32)
CREATININE: 1.22 mg/dL (ref 0.70–1.25)
Chloride: 105 mmol/L (ref 98–110)
GFR, EST AFRICAN AMERICAN: 72 mL/min/{1.73_m2} (ref >=60)
GFR, EST NON AFRICAN AMERICAN: 62 mL/min/{1.73_m2} (ref >=60)
GLUCOSE: 191 mg/dL — AB (ref 65–99)
Globulin: 2.2 g/dL (calc) (ref 1.9–3.7)
Potassium: 4.4 mmol/L (ref 3.5–5.3)
Sodium: 138 mmol/L (ref 135–146)
TOTAL PROTEIN: 6.3 g/dL (ref 6.1–8.1)
Total Bilirubin: 0.7 mg/dL (ref 0.2–1.2)

## 2018-07-26 LAB — HEMOGLOBIN A1C
EAG (MMOL/L): 10.6 (calc)
Hgb A1c MFr Bld: 8.3 % of total Hgb — ABNORMAL HIGH (ref <5.7)
Mean Plasma Glucose: 192 (calc)

## 2018-07-28 ENCOUNTER — Ambulatory Visit (INDEPENDENT_AMBULATORY_CARE_PROVIDER_SITE_OTHER): Payer: Medicare Other | Admitting: "Endocrinology

## 2018-07-28 ENCOUNTER — Encounter: Payer: Self-pay | Admitting: "Endocrinology

## 2018-07-28 VITALS — BP 127/76 | HR 54 | Ht 74.0 in | Wt 225.0 lb

## 2018-07-28 DIAGNOSIS — E1159 Type 2 diabetes mellitus with other circulatory complications: Secondary | ICD-10-CM

## 2018-07-28 DIAGNOSIS — E1122 Type 2 diabetes mellitus with diabetic chronic kidney disease: Secondary | ICD-10-CM

## 2018-07-28 DIAGNOSIS — Z91199 Patient's noncompliance with other medical treatment and regimen due to unspecified reason: Secondary | ICD-10-CM | POA: Insufficient documentation

## 2018-07-28 DIAGNOSIS — N183 Chronic kidney disease, stage 3 (moderate): Secondary | ICD-10-CM

## 2018-07-28 DIAGNOSIS — E782 Mixed hyperlipidemia: Secondary | ICD-10-CM | POA: Diagnosis not present

## 2018-07-28 DIAGNOSIS — I10 Essential (primary) hypertension: Secondary | ICD-10-CM

## 2018-07-28 DIAGNOSIS — Z9119 Patient's noncompliance with other medical treatment and regimen: Secondary | ICD-10-CM | POA: Insufficient documentation

## 2018-07-28 NOTE — Patient Instructions (Signed)

## 2018-07-28 NOTE — Progress Notes (Signed)
Endocrinology follow-up note   Subjective:    Patient ID: [name] [name], male    DOB: 1951-04-26, PCP Rory Percy, MD   Past Medical History:  Diagnosis Date  . Anxiety   . 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 Arthritis   . Coronary artery disease cardiologist-  dr Domenic Polite   hx NSTEMI 04-19-2013--- Multivessel CAD /  04-25-2013 s/p post CABG x4,  (LIMA-LAD, SVG-D1, SVG-D2, SVG-OM)  . Essential hypertension, benign   . First degree heart block   . GERD (gastroesophageal reflux disease)   . Gout    as of 04-07-2017-- per pt/ wife stable  . History of kidney stones   . History of non-ST elevation myocardial infarction (NSTEMI) 04/19/2013   s/p  cabg  . History of peptic ulcer disease dx 1990s  . Hyperlipidemia   . Left ureteral stone   . Nocturia   . Nodule of right lung    per CT 04-04-2017  right lower lobe multiple noodules  . OSA on CPAP   . Renal cyst, right    per CT 04-04-2017  . Renal insufficiency   . S/P CABG x 4 04/25/2013   LIMA to LAD;  SVG to OD;  SVG to D2;  SVG to OM  . Type 2 diabetes mellitus ([place])    last A1c 6.8 on 01-02-2017  . Wears glasses    Past Surgical History:  Procedure Laterality Date  . CARDIOVASCULAR STRESS TEST  11-13-2016   dr Domenic Polite   Intermediate risk nuclear study w/ medium defect of moderate severity in the basal inferoseptal, basal inferior, mid inferoseptal and mid inferior location (findings consistant w/ prior myocardial infarction)/  mild enlarged LV cavity size;  nuclear stress EF 45%  . COLONOSCOPY N/A 04/13/2017   Procedure: COLONOSCOPY;  Surgeon: Aviva Signs, MD;  [facility];  Service: Gastroenterology;  Laterality: N/A;  . CORONARY ARTERY BYPASS GRAFT N/A 04/25/2013   Procedure: CORONARY ARTERY BYPASS GRAFTING (CABG);  Surgeon: [name], MD;  Location: [place];  Service: Open Heart Surgery;  Laterality: N/A;  Coronary artery bypass graft times four, on pump,  using left internal mammary artery and right greater saphenous vein via endovein harvest.  . CYSTOSCOPY WITH RETROGRADE PYELOGRAM, URETEROSCOPY AND STENT PLACEMENT Left 04/08/2017   Procedure: CYSTOSCOPY WITH RETROGRADE PYELOGRAM, URETEROSCOPY,STENT PLACEMENT;  Surgeon: Lucas Mallow, MD;  Location: [facility];  Service: Urology;  Laterality: Left;  . CYSTOSCOPY/URETEROSCOPY/HOLMIUM LASER/STENT PLACEMENT Left 05/17/2017   Procedure: CYSTOSCOPY / RETROGRADE PYELOGRAM / URETEROSCOPY / HOLMIUM LASER / STONE BASKETRY / STENT EXCHANGE;  Surgeon: Lucas Mallow, MD;  Location: [place];  Service: Urology;  Laterality: Left;  ONLY NEEDS 45 MIN FOR PROCEDURE  . ELBOW BURSA SURGERY Right 03-10-2017     at [facility]   and removal of spur  . INTRAOPERATIVE TRANSESOPHAGEAL ECHOCARDIOGRAM N/A 04/25/2013   Procedure: INTRAOPERATIVE TRANSESOPHAGEAL ECHOCARDIOGRAM;  Surgeon: [name], MD;  Location: [facility] OR;  Service: Open Heart Surgery;  Laterality: N/A;  . KNEE ARTHROSCOPY Left 10/ 2017  approx.    dr Theda Sers  . LAPAROSCOPIC RIGHT HEMI COLECTOMY Right 04/19/2017   Procedure: OPEN RIGHT HEMI COLECTOMY;  Surgeon: Virl Cagey, MD;  Location: [facility];  Service: General;  Laterality: Right;  . LAPAROSCOPY N/A 04/19/2017   Procedure: LAPAROSCOPY DIAGNOSTIC;  Surgeon: Virl Cagey, MD;  Location: [facility];  Service: General;  Laterality: N/A;  patient knows to arrive at 6:15  . LEFT HEART CATHETERIZATION WITH CORONARY ANGIOGRAM N/A 04/20/2013   Procedure: LEFT HEART CATHETERIZATION WITH CORONARY ANGIOGRAM;  Surgeon: [name], MD;  Location: [facility] C[facility] LAB;  Service: Cardiovascular;  Laterality: N/A;  . NASAL SEPTUM SURGERY  1990s approx  . TONSILLECTOMY  6 (age 81)  . TRANSTHORACIC ECHOCARDIOGRAM  11-17-2016   dr Domenic Polite   mild LVH,  ef 55-60%, LV wall motion & diastolic function indeterminant assessment,  images were inadequate/  mild LAE/  trivial Tr   Social  History   Socioeconomic History  . Marital status: Married    Spouse name: Not on file  . Number of children: Not on file  . Years of education: Not on file  . Highest education level: Not on file  Occupational History  . Occupation: Drives Chief Operating Officer: [employer]  Social Needs  . Financial resource strain: Not on file  . Food insecurity:    Worry: Not on file    Inability: Not on file  . Transportation needs:    Medical: Not on file    Non-medical: Not on file  Tobacco Use  . Smoking status: Never Smoker  . Smokeless tobacco: Never Used  Substance and Sexual Activity  . Alcohol use: No    Alcohol/week: 0.0 standard drinks  . Drug use: No  . Sexual activity: Yes    Birth control/protection: None  Lifestyle  . Physical activity:    Days per week: Not on file    Minutes per session: Not on file  . Stress: Not on file  Relationships  . Social connections:    Talks on phone: Not on file    Gets together: Not on file    Attends religious service: Not on file    Active member of club or organization: Not on file    Attends meetings of clubs or organizations: Not on file    Relationship status: Not on file  Other Topics Concern  . Not on file  Social History Narrative   Lives with wife, does not exercise but part of his job is strenuous. Works in a body-shop part-time, too.   2 of his mother's sisters and 1 brother died suddenly (family was told MI but no autopsy).    Outpatient Encounter Medications as of 07/28/2018  Medication Sig  . Ascorbic Acid (VITAMIN C) 1000 MG tablet Take 1,000 mg by mouth daily.  Marland Kitchen aspirin EC 325 MG EC tablet Take 1 tablet (325 mg total) by mouth daily. (Patient taking differently: Take 325 mg by mouth every morning. )  . colchicine 0.6 MG tablet Take 0.6 mg by mouth daily.   Marland Kitchen escitalopram (LEXAPRO) 10 MG tablet Take 10 mg by mouth at bedtime.   Marland Kitchen lisinopril (PRINIVIL,ZESTRIL) 10 MG tablet Take 5 mg by mouth every morning.   .  lovastatin (MEVACOR) 20 MG tablet TAKE ONE TABLET BY MOUTH AT BEDTIME, STOP TAKING ATORVASTATIN  . metFORMIN (GLUCOPHAGE) 500 MG tablet TAKE 1 TABLET TWICE A DAY  . metoprolol tartrate (LOPRESSOR) 25 MG tablet Take 0.5 tablets (12.5 mg total) by mouth 2 (two) times daily.  . Multiple Vitamin (MULTIVITAMIN WITH MINERALS) TABS tablet Take 1 tablet by mouth daily.  . Omega-3 Fatty Acids (FISH OIL) 1000 MG CPDR Take 1 capsule by mouth daily.  Marland Kitchen omeprazole (PRILOSEC) 20 MG capsule Take 20 mg by mouth every morning.   . pyridoxine (B-6) 200 MG tablet Take 200 mg by  mouth daily.  . [DISCONTINUED] acetaminophen (TYLENOL) 500 MG tablet Take 2 tablets (1,000 mg total) by mouth every 6 (six) hours as needed for mild pain. (Patient not taking: Reported on 02/08/2018)   No facility-administered encounter medications on file as of 07/28/2018.    ALLERGIES: No Known Allergies VACCINATION STATUS: Immunization History  Administered Date(s) Administered  . Influenza Split 02/10/2014  . Influenza,inj,Quad PF,6+ Mos 04/20/2017    Diabetes  He presents for his follow-up diabetic visit. He has type 2 diabetes mellitus. His disease course has been worsening. There are no hypoglycemic associated symptoms. Pertinent negatives for hypoglycemia include no confusion, headaches, pallor or seizures. Associated symptoms include polydipsia and polyuria. Pertinent negatives for diabetes include no chest pain, no fatigue, no polyphagia and no weakness. There are no hypoglycemic complications. Symptoms are worsening. Diabetic complications include heart disease and nephropathy. Risk factors for coronary artery disease include diabetes mellitus, dyslipidemia, hypertension, male sex and sedentary lifestyle. He is compliant with treatment most of the time. His weight is fluctuating minimally. He is following a generally unhealthy diet. When asked about meal planning, he reported none. He never participates in exercise. (He does not  monitor blood glucose regularly.  He did not bring any meter.  His pre-visit labs show A1c of 8.3% increasing slowly from 6.7%.) An ACE inhibitor/angiotensin II receptor blocker is being taken.  Hyperlipidemia  This is a chronic problem. The current episode started more than 1 year ago. Exacerbating diseases include diabetes. Pertinent negatives include no chest pain, myalgias or shortness of breath. Current antihyperlipidemic treatment includes statins. Risk factors for coronary artery disease include diabetes mellitus, dyslipidemia, hypertension, male sex and a sedentary lifestyle.  Hypertension  This is a chronic problem. The current episode started more than 1 year ago. The problem is controlled. Pertinent negatives include no chest pain, headaches, neck pain, palpitations or shortness of breath. Risk factors for coronary artery disease include dyslipidemia, diabetes mellitus, male gender and sedentary lifestyle. Past treatments include beta blockers and ACE inhibitors.    Review of Systems  Constitutional: Negative for chills, fatigue, fever and unexpected weight change.  HENT: Negative for dental problem, mouth sores and trouble swallowing.   Eyes: Negative for visual disturbance.  Respiratory: Negative for cough, choking, chest tightness, shortness of breath and wheezing.   Cardiovascular: Negative for chest pain, palpitations and leg swelling.  Gastrointestinal: Negative for abdominal distention, abdominal pain, constipation, diarrhea, nausea and vomiting.  Endocrine: Positive for polydipsia and polyuria. Negative for polyphagia.  Genitourinary: Negative for dysuria, flank pain, hematuria and urgency.  Musculoskeletal: Negative for back pain, gait problem, myalgias and neck pain.  Skin: Negative for pallor, rash and wound.  Neurological: Negative for seizures, syncope, weakness, numbness and headaches.  Psychiatric/Behavioral: Negative.  Negative for confusion and dysphoric mood.     Objective:    BP 127/76   Pulse (!) 54   Ht 6\' 2"  (1.88 m)   Wt 225 lb (102.1 kg)   BMI 28.89 kg/m   Wt Readings from Last 3 Encounters:  07/28/18 225 lb (102.1 kg)  04/25/18 226 lb (102.5 kg)  02/08/18 222 lb (100.7 kg)    Physical Exam  Constitutional: He is oriented to person, place, and time. He appears well-developed and well-nourished. He is cooperative. No distress.  HENT:  Head: Normocephalic and atraumatic.  Eyes: EOM are normal.  Neck: Normal range of motion. Neck supple. No tracheal deviation present. No thyromegaly present.  Cardiovascular: Normal rate, S1 normal, S2 normal and normal heart  sounds. Exam reveals no gallop.  No murmur heard. Pulses:      Dorsalis pedis pulses are 1+ on the right side and 1+ on the left side.       Posterior tibial pulses are 1+ on the right side and 1+ on the left side.  Pulmonary/Chest: Breath sounds normal. No respiratory distress. He has no wheezes.  Abdominal: Soft. Bowel sounds are normal. He exhibits no distension. There is no abdominal tenderness. There is no guarding and no CVA tenderness.  Musculoskeletal:        General: No edema.     Right shoulder: He exhibits no swelling and no deformity.  Neurological: He is alert and oriented to person, place, and time. He has normal strength and normal reflexes. No cranial nerve deficit or sensory deficit. Gait normal.  Skin: Skin is warm and dry. No rash noted. No cyanosis. Nails show no clubbing.  Psychiatric: He has a normal mood and affect. His speech is normal and behavior is normal. Judgment and thought content normal. Cognition and memory are normal.     CMP     Component Value Date/Time   NA 138 07/25/2018 0801   K 4.4 07/25/2018 0801   CL 105 07/25/2018 0801   CO2 25 07/25/2018 0801   GLUCOSE 191 (H) 07/25/2018 0801   BUN 15 07/25/2018 0801   CREATININE 1.22 07/25/2018 0801   CALCIUM 9.4 07/25/2018 0801   PROT 6.3 07/25/2018 0801   ALBUMIN 3.8 04/04/2017 1134    AST 27 07/25/2018 0801   ALT 29 07/25/2018 0801   ALKPHOS 102 04/04/2017 1134   BILITOT 0.7 07/25/2018 0801   GFRNONAA 62 07/25/2018 0801   GFRAA 72 07/25/2018 0801     Diabetic Labs (most recent): Lab Results  Component Value Date   HGBA1C 8.3 (H) 07/25/2018   HGBA1C 7.0 (H) 04/19/2018   HGBA1C 6.7 (H) 09/09/2017     Lipid Panel ( most recent) Lipid Panel     Component Value Date/Time   CHOL 162 09/09/2017 0815   TRIG 240 (H) 09/09/2017 0815   HDL 35 (L) 09/09/2017 0815   CHOLHDL 4.6 09/09/2017 0815   VLDL 21 07/02/2016 0853   LDLCALC 94 09/09/2017 0815     Assessment & Plan:   1. Type 2 diabetes mellitus with other circulatory complications , and acute on chronic renal insufficiency   His diabetes is  complicated by coronary artery disease status post coronary artery bypass graft , acute on chronic renal insufficiency and patient remains at a high risk for more acute and chronic complications of diabetes which include CAD, CVA, CKD, retinopathy, and neuropathy. These are all discussed in detail with the patient.  -  He came with a1c of 8.3%, progressively increasing from 6.1%.   - Recent labs reviewed.  - I have re-counseled the patient on diet management  by adopting a carbohydrate restricted / protein rich  Diet.  -He admits to dietary indiscretions including consumption of sweets and sweetened beverages.  -  Suggestion is made for him to avoid simple carbohydrates  from his diet including Cakes, Sweet Desserts / Pastries, Ice Cream, Soda (diet and regular), Sweet Tea, Candies, Chips, Cookies, Store Bought Juices, Alcohol in Excess of  1-2 drinks a day, Artificial Sweeteners, and "Sugar-free" Products. This will help patient to have stable blood glucose profile and potentially avoid unintended weight gain.   - Patient is advised to stick to a routine mealtimes to eat 3 meals  a day and  avoid unnecessary snacks ( to snack only to correct hypoglycemia).  - I have  approached patient with the following individualized plan to manage diabetes and patient agrees.  -Given his high risk for more and recurrent coronary artery disease, he was approached for additional treatment with either weekly GLP-1 receptor agonist or basal insulin.  Unfortunately, he refused any further additional treatment at this time.  He promises to do better on his diet and exercise.  -He is willing to stay on metformin 500 mg p.o. twice daily -after breakfast and after supper.  Due to his CKD, he will not tolerate maximum dose of metformin. -He will be reapproached for basal insulin if A1c remains above 8% during his next visit.    - I have advised patient to keep adequate hydration and will repeat his renal function before his next visit. -He is approached to start monitoring blood glucose at least one time daily before breakfast and report if blood glucose readings are above 200 mg/dL 3 times a week.  - Patient specific target  for A1c; LDL, HDL, Triglycerides, and  Waist Circumference were discussed in detail.  2) BP/HTN: His blood pressure is controlled to target.   He is advised to continue his current blood pressure medications including metoprolol and lisinopril.     3) Lipids/HPL: He has uncontrolled lipid panel with LDL of 94.  He is advised continue lovastatin 20 mg p.o. nightly, will have fasting lipid panel on subsequent visit.     4)  Weight/Diet:  He has a steady weight, no success in weight loss.   He promises to do better on his diet and exercise.   5) Chronic Care/Health Maintenance:  -Patient is on  Statin medications and encouraged to continue to follow up with Ophthalmology, Podiatrist at least yearly or according to recommendations, and advised to  stay away from smoking. I have recommended yearly flu vaccine and pneumonia vaccination at least every 5 years; moderate intensity exercise for up to 150 minutes weekly; and  sleep for at least 7 hours a day.  - I  advised patient to maintain close follow up with Rory Percy, MD for primary care needs.  - Time spent with the patient: 25 min, of which >50% was spent in reviewing his  current and  previous labs, previous treatments, and medications doses and developing a plan for long-term care.  [name] [name] participated in the discussions, expressed understanding, and voiced agreement with the above plans.  All questions were answered to his satisfaction. he is encouraged to contact clinic should he have any questions or concerns prior to his return visit.  Follow up plan: -Return in about 3 months (around 10/27/2018) for Follow up with Pre-visit Labs, Meter, and Logs.    Glade Lloyd, MD Phone: [phone]  Fax: [phone]   -  This note was partially dictated with voice recognition software. Similar sounding words can be transcribed inadequately or may not  be corrected upon review.  07/28/2018, 9:15 AM

## 2018-08-12 ENCOUNTER — Other Ambulatory Visit: Payer: Self-pay | Admitting: Emergency Medicine

## 2018-08-12 ENCOUNTER — Telehealth: Payer: Self-pay | Admitting: Emergency Medicine

## 2018-08-12 DIAGNOSIS — D373 Neoplasm of uncertain behavior of appendix: Secondary | ICD-10-CM

## 2018-08-12 NOTE — Telephone Encounter (Signed)
Called patient to notify him of his appointment for ct wit h contrast on 08/29/18 @ 5pm, no response  left vm  No PA required per edward at [facility] [phone]

## 2018-08-16 ENCOUNTER — Telehealth: Payer: Self-pay | Admitting: Emergency Medicine

## 2018-08-16 NOTE — Telephone Encounter (Signed)
Called and notified patients wife of ct time and date on 2/11/ 20 @5pm  and that he needs to pick up his contrast the day before his ct  be npo 4 hours prior. His wife stated they were aware from the message in my chart and thanked me for the phone call

## 2018-08-29 ENCOUNTER — Ambulatory Visit (HOSPITAL_COMMUNITY)
Admission: RE | Admit: 2018-08-29 | Discharge: 2018-08-29 | Disposition: A | Discharge status: Home / Self Care | Payer: Medicare Other | Source: Ambulatory Visit | Attending: General Surgery | Admitting: General Surgery

## 2018-08-29 DIAGNOSIS — D373 Neoplasm of uncertain behavior of appendix: Secondary | ICD-10-CM | POA: Diagnosis not present

## 2018-08-29 MED ORDER — IOHEXOL 300 MG/ML  SOLN
100.0000 mL | Freq: Once | INTRAMUSCULAR | Status: AC | PRN
  Administered 2018-08-29: 100 mL via INTRAVENOUS

## 2018-08-31 ENCOUNTER — Telehealth: Payer: Self-pay | Admitting: Emergency Medicine

## 2018-08-31 NOTE — Telephone Encounter (Signed)
Called patient and spoke to his wife, gave her results of ct. Notified her that everything looked good but doctor bridges did want to make an appointment with [place] just to follow up with him. Appointment was made for 2/25 @ 9am

## 2018-08-31 NOTE — Telephone Encounter (Signed)
erronous encounter

## 2018-09-06 ENCOUNTER — Encounter: Payer: Self-pay | Admitting: General Surgery

## 2018-09-06 ENCOUNTER — Ambulatory Visit: Payer: Medicare Other | Admitting: General Surgery

## 2018-09-06 VITALS — BP 138/71 | HR 64 | Temp 97.7°F | Resp 18 | Wt 220.8 lb

## 2018-09-06 DIAGNOSIS — R918 Other nonspecific abnormal finding of lung field: Secondary | ICD-10-CM | POA: Insufficient documentation

## 2018-09-06 DIAGNOSIS — D373 Neoplasm of uncertain behavior of appendix: Secondary | ICD-10-CM | POA: Diagnosis not present

## 2018-09-06 DIAGNOSIS — K432 Incisional hernia without obstruction or gangrene: Secondary | ICD-10-CM | POA: Diagnosis not present

## 2018-09-06 DIAGNOSIS — K388 Other specified diseases of appendix: Secondary | ICD-10-CM

## 2018-09-06 NOTE — Progress Notes (Signed)
[facility] Note   HPI:  68 y.o. 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  He has been doing well and reports no pain or issues. He still has the slight bulge to the right of his abdomen he complained about during the prior exam.  He says he has regular BMs and eats a good diet.  His wife is worried today about his diabetes management. He recently saw Dr. [name].  Overall he has no new complaints or issues.   Review of Systems:  Regular BMs Midline/ right of midline bulge All other review of systems: otherwise negative   Vital Signs:  BP 138/71 (BP Location: Left Arm, Patient Position: Sitting, Cuff Size: Normal)   Pulse 64   Temp 97.7 F (36.5 C) (Temporal)   Resp 18   Wt 220 lb 12.8 oz (100.2 kg)   BMI 28.35 kg/m    Physical Exam:  Physical Exam Vitals signs reviewed.  HENT:     Head: Normocephalic.  Cardiovascular:     Rate and Rhythm: Normal rate.  Pulmonary:     Effort: Pulmonary effort is normal.  Abdominal:     General: There is no distension.     Palpations: Abdomen is soft.     Tenderness: There is no abdominal tenderness.     Hernia: A hernia is present.     Comments: Healed midline incisions, hernia just right of umbilicus  Musculoskeletal: Normal range of motion.  Skin:    General: Skin is warm and dry.  Neurological:     General: No focal deficit present.     Mental Status: He is alert.  Psychiatric:        Mood and Affect: Mood normal.        Behavior: Behavior normal.        Thought Content: Thought content normal.        Judgment: Judgment normal.     Laboratory studies: None  Personally reviewed CT- no signs of disease in the chest or abdomen/ pelvis, right of midline area of fascia defect/ hernia, some diastasis  Imaging:  CT a/p/c- IMPRESSION: 1. No findings to suggest metastatic disease in the chest, abdomen and  pelvis. 2. Previously noted pulmonary nodules appear stable in size and number compared to prior studies dating back to 2018, favored to be benign. Continued attention on follow-up studies is recommended to ensure continued stability. 3. Hepatic steatosis. 4. Aortic atherosclerosis, in addition to left main and 3 vessel coronary artery disease. Status post median sternotomy for CABG including LIMA to the LAD. 5. There are mild calcifications of the aortic valve. Echocardiographic correlation for evaluation of potential valvular dysfunction may be warranted if clinically indicated. 6. Additional incidental findings, as above    Assessment:  68 y.o. 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 He is doing well and having no issues. He does have a hernia a about the level of the umbilicus. He notices it more after being on his feet. He has some minor discomfort.  We discussed the risk of hernia and the signs of strangulation/ incarceration. We discussed the reasons for repair in the future.  CT a/p without signs of any mucinous fluid or metastatic disease. CT chest due to prior nodules stable.   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 Currently he wants to wait on any repair. We discussed that laparoscopic repair should be possible.   All of the above recommendations were discussed with the patient and patient's family, and all of patient's and family's questions were answered to their expressed satisfaction.  Curlene Labrum, MD [facility] [address] [place],  [place] [address] [phone] (office)

## 2018-09-06 NOTE — Patient Instructions (Signed)
CT a/p August 2020. Notify with any issues or concerns  Let us know if you want to have a hernia repair.

## 2018-10-26 ENCOUNTER — Telehealth: Payer: Self-pay | Admitting: *Deleted

## 2018-10-26 MED ORDER — METOPROLOL TARTRATE 25 MG PO TABS: 12.5000 mg | ORAL_TABLET | Freq: Two times a day (BID) | ORAL | 1 refills | Status: DC

## 2018-10-26 NOTE — Telephone Encounter (Signed)
Pt contacted per Dr Domenic Polite. History reviewed. No symptoms to suggest any unstable cardiac conditions. Based on discussion, with current pandemic situation, we have postponed 11/01/18 appointment until July 2020. If symptoms change, pt has been instructed to contact our office. Pt is having labs done this week for Dr [name]. Also requested #[phone] on refills of metoprolol to [facility]. Medication sent to pharmacy.

## 2018-10-27 ENCOUNTER — Other Ambulatory Visit: Payer: Self-pay

## 2018-10-27 MED ORDER — METFORMIN HCL 500 MG PO TABS: 500.0000 mg | ORAL_TABLET | Freq: Two times a day (BID) | ORAL | 0 refills | Status: DC

## 2018-10-28 LAB — HEMOGLOBIN A1C
EAG (MMOL/L): 7.4 (calc)
Hgb A1c MFr Bld: 6.3 % of total Hgb — ABNORMAL HIGH (ref <5.7)
MEAN PLASMA GLUCOSE: 134 (calc)

## 2018-10-28 LAB — COMPLETE METABOLIC PANEL WITH GFR
AG RATIO: 2.2 (calc) (ref 1.0–2.5)
ALT: 28 U/L (ref 9–46)
AST: 27 U/L (ref 10–35)
Albumin: 4.4 g/dL (ref 3.6–5.1)
Alkaline phosphatase (APISO): 66 U/L (ref 35–144)
BUN/Creatinine Ratio: 13 (calc) (ref 6–22)
BUN: 18 mg/dL (ref 7–25)
CO2: 28 mmol/L (ref 20–32)
Calcium: 9.4 mg/dL (ref 8.6–10.3)
Chloride: 104 mmol/L (ref 98–110)
Creat: 1.4 mg/dL — ABNORMAL HIGH (ref 0.70–1.25)
GFR, Est African American: 61 mL/min/{1.73_m2} (ref >=60)
GFR, Est Non African American: 52 mL/min/{1.73_m2} — ABNORMAL LOW (ref >=60)
Globulin: 2 g/dL (calc) (ref 1.9–3.7)
Glucose, Bld: 157 mg/dL — ABNORMAL HIGH (ref 65–139)
POTASSIUM: 4.4 mmol/L (ref 3.5–5.3)
Sodium: 138 mmol/L (ref 135–146)
Total Bilirubin: 1.3 mg/dL — ABNORMAL HIGH (ref 0.2–1.2)
Total Protein: 6.4 g/dL (ref 6.1–8.1)

## 2018-11-01 ENCOUNTER — Ambulatory Visit: Payer: Medicare Other | Admitting: Cardiology

## 2018-11-01 ENCOUNTER — Encounter: Payer: Self-pay | Admitting: "Endocrinology

## 2018-11-02 ENCOUNTER — Encounter: Payer: Self-pay | Admitting: "Endocrinology

## 2018-11-02 ENCOUNTER — Ambulatory Visit (INDEPENDENT_AMBULATORY_CARE_PROVIDER_SITE_OTHER): Payer: Medicare Other | Admitting: "Endocrinology

## 2018-11-02 ENCOUNTER — Other Ambulatory Visit: Payer: Self-pay

## 2018-11-02 DIAGNOSIS — N183 Chronic kidney disease, stage 3 (moderate): Secondary | ICD-10-CM | POA: Diagnosis not present

## 2018-11-02 DIAGNOSIS — I129 Hypertensive chronic kidney disease with stage 1 through stage 4 chronic kidney disease, or unspecified chronic kidney disease: Secondary | ICD-10-CM

## 2018-11-02 DIAGNOSIS — E1122 Type 2 diabetes mellitus with diabetic chronic kidney disease: Secondary | ICD-10-CM

## 2018-11-02 DIAGNOSIS — I10 Essential (primary) hypertension: Secondary | ICD-10-CM

## 2018-11-02 DIAGNOSIS — E782 Mixed hyperlipidemia: Secondary | ICD-10-CM

## 2018-11-02 MED ORDER — METFORMIN HCL 500 MG PO TABS: 500.0000 mg | ORAL_TABLET | Freq: Two times a day (BID) | ORAL | 0 refills | Status: DC

## 2018-11-02 NOTE — Progress Notes (Signed)
Endocrinology Telehealth Visit Follow up Note -During COVID -19 Pandemic  This visit type was conducted due to national recommendations for restrictions regarding the COVID-19 Pandemic  in an effort to limit this patient's exposure and mitigate transmission of the corona virus.  Due to his co-morbid illnesses, this patient is at  moderate to high risk for complications without adequate follow up.  This format is felt to be most appropriate for this patient at this time.  All issues noted in this document were discussed and addressed. The format was not optimal for physical exam.  Patient has verbally consented to this visit, and please refer to the patient's chart for details of arrangement for this telehealth for [facility] .     Subjective:    Patient ID: [name] [name], male    DOB: 1950-10-15, PCP Rory Percy, MD   Past Medical History:  Diagnosis Date  . Anxiety   . 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 Arthritis   . Coronary artery disease cardiologist-  dr Domenic Polite   hx NSTEMI 04-19-2013--- Multivessel CAD /  04-25-2013 s/p post CABG x4,  (LIMA-LAD, SVG-D1, SVG-D2, SVG-OM)  . Essential hypertension, benign   . First degree heart block   . GERD (gastroesophageal reflux disease)   . Gout    as of 04-07-2017-- per pt/ wife stable  . History of kidney stones   . History of non-ST elevation myocardial infarction (NSTEMI) 04/19/2013   s/p  cabg  . History of peptic ulcer disease dx 1990s  . Hyperlipidemia   . Left ureteral stone   . Nocturia   . Nodule of right lung    per CT 04-04-2017  right lower lobe multiple noodules  . OSA on CPAP   . Renal cyst, right    per CT 04-04-2017  . Renal insufficiency   . S/P CABG x 4 04/25/2013   LIMA to LAD;  SVG to OD;  SVG to D2;  SVG to OM  . Type 2 diabetes mellitus ([place])    last A1c 6.8 on 01-02-2017  . Wears glasses    Past Surgical History:   Procedure Laterality Date  . CARDIOVASCULAR STRESS TEST  11-13-2016   dr Domenic Polite   Intermediate risk nuclear study w/ medium defect of moderate severity in the basal inferoseptal, basal inferior, mid inferoseptal and mid inferior location (findings consistant w/ prior myocardial infarction)/  mild enlarged LV cavity size;  nuclear stress EF 45%  . COLONOSCOPY N/A 04/13/2017   Procedure: COLONOSCOPY;  Surgeon: Aviva Signs, MD;  [facility];  Service: Gastroenterology;  Laterality: N/A;  . CORONARY ARTERY BYPASS GRAFT N/A 04/25/2013   Procedure: CORONARY ARTERY BYPASS GRAFTING (CABG);  Surgeon: [name], MD;  Location: [place];  Service: Open Heart Surgery;  Laterality: N/A;  Coronary artery bypass graft times four, on pump, using left internal mammary artery and right greater saphenous vein via endovein harvest.  . CYSTOSCOPY WITH RETROGRADE PYELOGRAM, URETEROSCOPY AND STENT PLACEMENT Left 04/08/2017   Procedure: CYSTOSCOPY WITH RETROGRADE PYELOGRAM, URETEROSCOPY,STENT PLACEMENT;  Surgeon: Lucas Mallow, MD;  Location: [facility];  Service: Urology;  Laterality: Left;  .  CYSTOSCOPY/URETEROSCOPY/HOLMIUM LASER/STENT PLACEMENT Left 05/17/2017   Procedure: CYSTOSCOPY / RETROGRADE PYELOGRAM / URETEROSCOPY / HOLMIUM LASER / STONE BASKETRY / STENT EXCHANGE;  Surgeon: Lucas Mallow, MD;  Location: [place];  Service: Urology;  Laterality: Left;  ONLY NEEDS 45 MIN FOR PROCEDURE  . ELBOW BURSA SURGERY Right 03-10-2017     at [facility]   and removal of spur  . INTRAOPERATIVE TRANSESOPHAGEAL ECHOCARDIOGRAM N/A 04/25/2013   Procedure: INTRAOPERATIVE TRANSESOPHAGEAL ECHOCARDIOGRAM;  Surgeon: [name], MD;  Location: [facility] OR;  Service: Open Heart Surgery;  Laterality: N/A;  . KNEE ARTHROSCOPY Left 10/ 2017  approx.    dr Theda Sers  . LAPAROSCOPIC RIGHT HEMI COLECTOMY Right 04/19/2017   Procedure: OPEN RIGHT HEMI COLECTOMY;  Surgeon: Virl Cagey, MD;   [facility];  Service: General;  Laterality: Right;  . LAPAROSCOPY N/A 04/19/2017   Procedure: LAPAROSCOPY DIAGNOSTIC;  Surgeon: Virl Cagey, MD;  [facility];  Service: General;  Laterality: N/A;  patient knows to arrive at 6:15  . LEFT HEART CATHETERIZATION WITH CORONARY ANGIOGRAM N/A 04/20/2013   Procedure: LEFT HEART CATHETERIZATION WITH CORONARY ANGIOGRAM;  Surgeon: [name], MD;  [facility] CATH LAB;  Service: Cardiovascular;  Laterality: N/A;  . NASAL SEPTUM SURGERY  1990s approx  . TONSILLECTOMY  56 (age 11)  . TRANSTHORACIC ECHOCARDIOGRAM  11-17-2016   dr Domenic Polite   mild LVH,  ef 55-60%, LV wall motion & diastolic function indeterminant assessment,  images were inadequate/  mild LAE/  trivial Tr   Social History   Socioeconomic History  . Marital status: Married    Spouse name: Not on file  . Number of children: Not on file  . Years of education: Not on file  . Highest education level: Not on file  Occupational History  . Occupation: Drives Chief Operating Officer: [employer]  Social Needs  . Financial resource strain: Not on file  . Food insecurity:    Worry: Not on file    Inability: Not on file  . Transportation needs:    Medical: Not on file    Non-medical: Not on file  Tobacco Use  . Smoking status: Never Smoker  . Smokeless tobacco: Never Used  Substance and Sexual Activity  . Alcohol use: No    Alcohol/week: 0.0 standard drinks  . Drug use: No  . Sexual activity: Yes    Birth control/protection: None  Lifestyle  . Physical activity:    Days per week: Not on file    Minutes per session: Not on file  . Stress: Not on file  Relationships  . Social connections:    Talks on phone: Not on file    Gets together: Not on file    Attends religious service: Not on file    Active member of club or organization: Not on file    Attends meetings of clubs or organizations: Not on file    Relationship status: Not on file  Other Topics  Concern  . Not on file  Social History Narrative   Lives with wife, does not exercise but part of his job is strenuous. Works in a body-shop part-time, too.   2 of his mother's sisters and 1 brother died suddenly (family was told MI but no autopsy).    Outpatient Encounter Medications as of 11/02/2018  Medication Sig  . Ascorbic Acid (VITAMIN C) 1000 MG tablet Take 1,000 mg by mouth daily.  Marland Kitchen aspirin EC 325 MG EC  tablet Take 1 tablet (325 mg total) by mouth daily. (Patient taking differently: Take 325 mg by mouth every morning. )  . colchicine 0.6 MG tablet Take 0.6 mg by mouth daily.   Marland Kitchen escitalopram (LEXAPRO) 10 MG tablet Take 10 mg by mouth at bedtime.   Marland Kitchen lisinopril (PRINIVIL,ZESTRIL) 10 MG tablet Take 5 mg by mouth every morning.   . lovastatin (MEVACOR) 20 MG tablet TAKE ONE TABLET BY MOUTH AT BEDTIME, STOP TAKING ATORVASTATIN  . MAGNESIUM CITRATE PO Take 1 Bottle by mouth once.  . metFORMIN (GLUCOPHAGE) 500 MG tablet Take 1 tablet (500 mg total) by mouth 2 (two) times daily.  . metoprolol tartrate (LOPRESSOR) 25 MG tablet Take 0.5 tablets (12.5 mg total) by mouth 2 (two) times daily.  . Multiple Vitamin (MULTIVITAMIN WITH MINERALS) TABS tablet Take 1 tablet by mouth daily.  . Omega-3 Fatty Acids (FISH OIL) 1000 MG CPDR Take 1 capsule by mouth daily.  Marland Kitchen omeprazole (PRILOSEC) 20 MG capsule Take 20 mg by mouth every morning.   . pyridoxine (B-6) 200 MG tablet Take 200 mg by mouth daily.  . [DISCONTINUED] metFORMIN (GLUCOPHAGE) 500 MG tablet Take 1 tablet (500 mg total) by mouth 2 (two) times daily.   No facility-administered encounter medications on file as of 11/02/2018.    ALLERGIES: No Known Allergies VACCINATION STATUS: Immunization History  Administered Date(s) Administered  . Influenza Split 02/10/2014  . Influenza,inj,Quad PF,6+ Mos 04/20/2017    Diabetes  He presents for his follow-up diabetic visit. He has type 2 diabetes mellitus. His disease course has been improving.  There are no hypoglycemic associated symptoms. Pertinent negatives for hypoglycemia include no confusion, headaches, pallor or seizures. Pertinent negatives for diabetes include no chest pain, no fatigue, no polydipsia, no polyphagia, no polyuria and no weakness. There are no hypoglycemic complications. Symptoms are improving. Diabetic complications include heart disease and nephropathy. Risk factors for coronary artery disease include diabetes mellitus, dyslipidemia, hypertension, male sex and sedentary lifestyle. He is compliant with treatment most of the time. His weight is decreasing steadily. He is following a generally unhealthy diet. When asked about meal planning, he reported none. He never participates in exercise. An ACE inhibitor/angiotensin II receptor blocker is being taken.  Hyperlipidemia  This is a chronic problem. The current episode started more than 1 year ago. Exacerbating diseases include diabetes. Pertinent negatives include no chest pain, myalgias or shortness of breath. Current antihyperlipidemic treatment includes statins. Risk factors for coronary artery disease include diabetes mellitus, dyslipidemia, hypertension, male sex and a sedentary lifestyle.  Hypertension  This is a chronic problem. The current episode started more than 1 year ago. The problem is controlled. Pertinent negatives include no chest pain, headaches, neck pain, palpitations or shortness of breath. Risk factors for coronary artery disease include dyslipidemia, diabetes mellitus, male gender and sedentary lifestyle. Past treatments include beta blockers and ACE inhibitors.     Objective:    There were no vitals taken for this visit.  Wt Readings from Last 3 Encounters:  09/06/18 220 lb 12.8 oz (100.2 kg)  07/28/18 225 lb (102.1 kg)  04/25/18 226 lb (102.5 kg)      CMP     Component Value Date/Time   NA 138 10/27/2018 0938   K 4.4 10/27/2018 0938   CL 104 10/27/2018 0938   CO2 28 10/27/2018 0938    GLUCOSE 157 (H) 10/27/2018 0938   BUN 18 10/27/2018 0938   CREATININE 1.40 (H) 10/27/2018 0938   CALCIUM 9.4 10/27/2018  0938   PROT 6.4 10/27/2018 0938   ALBUMIN 3.8 04/04/2017 1134   AST 27 10/27/2018 0938   ALT 28 10/27/2018 0938   ALKPHOS 102 04/04/2017 1134   BILITOT 1.3 (H) 10/27/2018 0938   GFRNONAA 52 (L) 10/27/2018 0938   GFRAA 61 10/27/2018 0938     Diabetic Labs (most recent): Lab Results  Component Value Date   HGBA1C 6.3 (H) 10/27/2018   HGBA1C 8.3 (H) 07/25/2018   HGBA1C 7.0 (H) 04/19/2018     Lipid Panel ( most recent) Lipid Panel     Component Value Date/Time   CHOL 162 09/09/2017 0815   TRIG 240 (H) 09/09/2017 0815   HDL 35 (L) 09/09/2017 0815   CHOLHDL 4.6 09/09/2017 0815   VLDL 21 07/02/2016 0853   LDLCALC 94 09/09/2017 0815     Assessment & Plan:   1. Type 2 diabetes mellitus with other circulatory complications , and acute on chronic renal insufficiency   His diabetes is  complicated by coronary artery disease status post coronary artery bypass graft , acute on chronic renal insufficiency and patient remains at a high risk for more acute and chronic complications of diabetes which include CAD, CVA, CKD, retinopathy, and neuropathy. These are all discussed in detail with the patient.  -  His previsit labs show A1c of 6.3% improving from 8.3%.   - Recent labs reviewed.  - I have re-counseled the patient on diet management  by adopting a carbohydrate restricted / protein rich  Diet.  -He admits to dietary indiscretions including consumption of sweets and sweetened beverages.  - Patient admits there is a room for improvement in his diet and drink choices. -  Suggestion is made for him to avoid simple carbohydrates  from his diet including Cakes, Sweet Desserts / Pastries, Ice Cream, Soda (diet and regular), Sweet Tea, Candies, Chips, Cookies, Store Bought Juices, Alcohol in Excess of  1-2 drinks a day, Artificial Sweeteners, and "Sugar-free" Products.  This will help patient to have stable blood glucose profile and potentially avoid unintended weight gain.  - Patient is advised to stick to a routine mealtimes to eat 3 meals  a day and avoid unnecessary snacks ( to snack only to correct hypoglycemia).  - I have approached patient with the following individualized plan to manage diabetes and patient agrees.  -Given his improved A1c of 6.3%, he will not require insulin treatment at this time.    -He is willing to stay on modified lifestyle including consumption of less carbohydrates.   -He is advised to continue metformin 500 mg p.o. twice daily-after breakfast and after supper.  He will be considered for low-dose glipizide if he loses control of diabetes during his subsequent visits.  - I have advised patient to keep adequate hydration and will repeat his renal function before his next visit. -He is approached to start monitoring blood glucose at least one time daily before breakfast and report if blood glucose readings are above 200 mg/dL 3 times a week.    2) BP/HTN: he is advised to home monitor blood pressure and report if > 140/90 on 2 separate readings.   He is advised to continue his current blood pressure medications including metoprolol and lisinopril.     3) Lipids/HPL: He has uncontrolled lipid panel with LDL of 94.  He is advised continue lovastatin 20 mg p.o. nightly, will have fasting lipid panel on subsequent visit.     4) Chronic Care/Health Maintenance:  -Patient is on  Statin medications and encouraged to continue to follow up with Ophthalmology, Podiatrist at least yearly or according to recommendations, and advised to  stay away from smoking. I have recommended yearly flu vaccine and pneumonia vaccination at least every 5 years; moderate intensity exercise for up to 150 minutes weekly; and  sleep for at least 7 hours a day.  - I advised patient to maintain close follow up with Rory Percy, MD for primary care  needs.  - Patient Care Time Today:  25 min, of which >50% was spent in reviewing his  current and  previous labs/studies, previous treatments, and medications doses and developing a plan for long-term care based on the latest recommendations for standards of care.  [name] [name] participated in the discussions, expressed understanding, and voiced agreement with the above plans.  All questions were answered to his satisfaction. he is encouraged to contact clinic should he have any questions or concerns prior to his return visit.   Follow up plan: -Return in about 4 months (around 03/04/2019) for Follow up with Pre-visit Labs.    Glade Lloyd, MD Phone: [phone]  Fax: [phone]   -  This note was partially dictated with voice recognition software. Similar sounding words can be transcribed inadequately or may not  be corrected upon review.  11/02/2018, 11:08 AM

## 2019-01-30 ENCOUNTER — Telehealth: Payer: Self-pay | Admitting: Cardiology

## 2019-01-30 NOTE — Telephone Encounter (Signed)

## 2019-01-31 ENCOUNTER — Ambulatory Visit (INDEPENDENT_AMBULATORY_CARE_PROVIDER_SITE_OTHER): Payer: Medicare Other | Admitting: Cardiology

## 2019-01-31 ENCOUNTER — Encounter: Payer: Self-pay | Admitting: Cardiology

## 2019-01-31 ENCOUNTER — Other Ambulatory Visit: Payer: Self-pay

## 2019-01-31 ENCOUNTER — Other Ambulatory Visit: Payer: Self-pay | Admitting: "Endocrinology

## 2019-01-31 VITALS — BP 122/78 | HR 50 | Temp 98.7°F | Ht 74.0 in | Wt 224.0 lb

## 2019-01-31 DIAGNOSIS — I10 Essential (primary) hypertension: Secondary | ICD-10-CM | POA: Diagnosis not present

## 2019-01-31 DIAGNOSIS — Z79899 Other long term (current) drug therapy: Secondary | ICD-10-CM | POA: Diagnosis not present

## 2019-01-31 DIAGNOSIS — E782 Mixed hyperlipidemia: Secondary | ICD-10-CM | POA: Diagnosis not present

## 2019-01-31 DIAGNOSIS — I25119 Atherosclerotic heart disease of native coronary artery with unspecified angina pectoris: Secondary | ICD-10-CM

## 2019-01-31 NOTE — Progress Notes (Signed)
Cardiology Office Note  Date: 01/31/2019   ID: [name] [name], DOB 09-30-1950, MRN [id number]  PCP:  Rory Percy, MD  Cardiologist:  [name], MD Electrophysiologist:  None   Chief Complaint  Patient presents with  . Coronary Artery Disease    History of Present Illness: [name] [name] is a 68 y.o. male last seen in April 2019.  He presents for a routine visit.  He does not report any active angina symptoms at this time on medical therapy and stable NYHA class II dyspnea.  No palpitations or syncope.  He is retired but does part-time work at a [employer] in [place].  I reviewed his medications which are outlined below.  He reports compliance and no obvious intolerances.  I reviewed his lab work from April and, outlined below.  He is still following with endocrinology, last hemoglobin A1c was 6.3%.  He has not had a recent lipid panel.  I personally reviewed his ECG today which shows sinus bradycardia with prolonged PR interval, low voltage, and nonspecific ST-T changes.  Past Medical History:  Diagnosis Date  . Anxiety   . 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 Arthritis   . Coronary artery disease    NSTEMI 585277; multivessel CAD s/p post CABG (LIMA-LAD, SVG-D1, SVG-D2, SVG-OM)  . Essential hypertension   . GERD (gastroesophageal reflux disease)   . Gout   . History of kidney stones   . History of peptic ulcer disease   . Hyperlipidemia   . Left ureteral stone   . Nodule of right lung    CT 04-04-2017  right lower lobe multiple nodules  . OSA on CPAP   . Renal cyst, right    CT 04-04-2017  . Renal insufficiency   . Type 2 diabetes mellitus ([place])   . Wears glasses     Past Surgical History:  Procedure Laterality Date  . CARDIOVASCULAR STRESS TEST  11-13-2016   dr Domenic Polite   Intermediate risk nuclear study w/ medium defect of moderate severity in the basal inferoseptal, basal inferior, mid inferoseptal and mid  inferior location (findings consistant w/ prior myocardial infarction)/  mild enlarged LV cavity size;  nuclear stress EF 45%  . COLONOSCOPY N/A 04/13/2017   Procedure: COLONOSCOPY;  Surgeon: Aviva Signs, MD;  [facility];  Service: Gastroenterology;  Laterality: N/A;  . CORONARY ARTERY BYPASS GRAFT N/A 04/25/2013   Procedure: CORONARY ARTERY BYPASS GRAFTING (CABG);  Surgeon: [name], MD;  Location: [place];  Service: Open Heart Surgery;  Laterality: N/A;  Coronary artery bypass graft times four, on pump, using left internal mammary artery and right greater saphenous vein via endovein harvest.  . CYSTOSCOPY WITH RETROGRADE PYELOGRAM, URETEROSCOPY AND STENT PLACEMENT Left 04/08/2017   Procedure: CYSTOSCOPY WITH RETROGRADE PYELOGRAM, URETEROSCOPY,STENT PLACEMENT;  Surgeon: Lucas Mallow, MD;  [facility];  Service: Urology;  Laterality: Left;  . CYSTOSCOPY/URETEROSCOPY/HOLMIUM LASER/STENT PLACEMENT Left 05/17/2017   Procedure: CYSTOSCOPY / RETROGRADE PYELOGRAM / URETEROSCOPY / HOLMIUM LASER / STONE BASKETRY / STENT EXCHANGE;  Surgeon: Lucas Mallow, MD;  Location: [place];  Service: Urology;  Laterality: Left;  ONLY NEEDS 45 MIN FOR PROCEDURE  . ELBOW BURSA SURGERY Right 03-10-2017     at [facility]   and removal of spur  . INTRAOPERATIVE TRANSESOPHAGEAL ECHOCARDIOGRAM N/A 04/25/2013   Procedure: INTRAOPERATIVE TRANSESOPHAGEAL ECHOCARDIOGRAM;  Surgeon: [name], MD;  Location: [place] [facility];  Service: Open Heart Surgery;  Laterality: N/A;  . KNEE ARTHROSCOPY Left 10/ 2017  approx.    dr Theda Sers  . LAPAROSCOPIC RIGHT HEMI COLECTOMY Right 04/19/2017   Procedure: OPEN RIGHT HEMI COLECTOMY;  Surgeon: Virl Cagey, MD;  [facility];  Service: General;  Laterality: Right;  . LAPAROSCOPY N/A 04/19/2017   Procedure: LAPAROSCOPY DIAGNOSTIC;  Surgeon: Virl Cagey, MD;  [facility];  Service: General;  Laterality: N/A;  patient  knows to arrive at 6:15  . LEFT HEART CATHETERIZATION WITH CORONARY ANGIOGRAM N/A 04/20/2013   Procedure: LEFT HEART CATHETERIZATION WITH CORONARY ANGIOGRAM;  Surgeon: [name], MD;  Location: [facility] CATH LAB;  Service: Cardiovascular;  Laterality: N/A;  . NASAL SEPTUM SURGERY  1990s approx  . TONSILLECTOMY  85 (age 48)  . TRANSTHORACIC ECHOCARDIOGRAM  11-17-2016   dr Domenic Polite   mild LVH,  ef 55-60%, LV wall motion & diastolic function indeterminant assessment,  images were inadequate/  mild LAE/  trivial Tr    Current Outpatient Medications  Medication Sig Dispense Refill  . Ascorbic Acid (VITAMIN C) 1000 MG tablet Take 1,000 mg by mouth daily.    Marland Kitchen aspirin EC 325 MG EC tablet Take 1 tablet (325 mg total) by mouth daily. (Patient taking differently: Take 325 mg by mouth every morning. ) 30 tablet 0  . colchicine 0.6 MG tablet Take 0.6 mg by mouth daily.     Marland Kitchen escitalopram (LEXAPRO) 10 MG tablet Take 10 mg by mouth at bedtime.     Marland Kitchen lisinopril (PRINIVIL,ZESTRIL) 10 MG tablet Take 5 mg by mouth every morning.     . lovastatin (MEVACOR) 20 MG tablet TAKE ONE TABLET BY MOUTH AT BEDTIME, STOP TAKING ATORVASTATIN 90 tablet 3  . MAGNESIUM CITRATE PO Take 1 Bottle by mouth once.    . metFORMIN (GLUCOPHAGE) 500 MG tablet Take 1 tablet (500 mg total) by mouth 2 (two) times daily. 180 tablet 0  . metoprolol tartrate (LOPRESSOR) 25 MG tablet Take 0.5 tablets (12.5 mg total) by mouth 2 (two) times daily. 90 tablet 1  . Multiple Vitamin (MULTIVITAMIN WITH MINERALS) TABS tablet Take 1 tablet by mouth daily.    . Omega-3 Fatty Acids (FISH OIL) 1000 MG CPDR Take 1 capsule by mouth daily.    Marland Kitchen omeprazole (PRILOSEC) 20 MG capsule Take 20 mg by mouth every morning.     . pyridoxine (B-6) 200 MG tablet Take 200 mg by mouth daily.     No current facility-administered medications for this visit.    Allergies:  Patient has no known allergies.   Social History: The patient  reports that he has never smoked.  He has never used smokeless tobacco. He reports that he does not drink alcohol or use drugs.   ROS:  Please see the history of present illness. Otherwise, complete review of systems is positive for none.  All other systems are reviewed and negative.   Physical Exam: VS:  BP 122/78   Pulse (!) 50   Temp 98.7 F (37.1 C)   Ht 6\' 2"  (1.88 m)   Wt 224 lb (101.6 kg)   SpO2 98%   BMI 28.76 kg/m , BMI Body mass index is 28.76 kg/m.  Wt Readings from Last 3 Encounters:  01/31/19 224 lb (101.6 kg)  09/06/18 220 lb 12.8 oz (100.2 kg)  07/28/18 225 lb (102.1 kg)    General: Patient appears comfortable at rest. HEENT: Conjunctiva and lids normal, oropharynx clear. Neck: Supple, no  elevated JVP or carotid bruits, no thyromegaly. Lungs: Clear to auscultation, nonlabored breathing at rest. Cardiac: Regular rate and rhythm, no S3 or significant systolic murmur. Abdomen: Soft, nontender, bowel sounds present, no guarding or rebound. Extremities: No pitting edema, distal pulses 2+. Skin: Warm and dry. Musculoskeletal: No kyphosis. Neuropsychiatric: Alert and oriented x3, affect grossly appropriate.  ECG:  An ECG dated 04/14/2017 was personally reviewed today and demonstrated:  Sinus rhythm with prolonged QT interval.  Recent Labwork: 10/27/2018: ALT 28; AST 27; BUN 18; Creat 1.40; Potassium 4.4; Sodium 138     Component Value Date/Time   CHOL 162 09/09/2017 0815   TRIG 240 (H) 09/09/2017 0815   HDL 35 (L) 09/09/2017 0815   CHOLHDL 4.6 09/09/2017 0815   VLDL 21 07/02/2016 0853   LDLCALC 94 09/09/2017 0815    Other Studies Reviewed Today:  [facility] Adam Myoview 11/13/2016:  There was no ST segment deviation noted during stress.  Defect 1: There is a medium defect of moderate severity present in the basal inferoseptal, basal inferior, mid inferoseptal and mid inferior location.  Findings consistent with prior myocardial infarction.  This is an intermediate risk study.  Nuclear stress EF:  45%.  Echocardiogram 11/17/2016: Study Conclusions  - Left ventricle: The cavity size was normal. Wall thickness was increased in a pattern of mild LVH. Systolic function was normal. The estimated ejection fraction was in the range of 55% to 60%. - Aortic valve: Mildly calcified annulus. Mildly thickened, mildly calcified leaflets. Valve area (VTI): 3.22 cm^2. Valve area (Vmax): 3.3 cm^2. - Left atrium: The atrium was mildly dilated. - Technically adequate study.  Assessment and Plan:  1.  Multivessel CAD status post CABG in 2014.  Reports no active angina on medical therapy and underwent a Lexiscan Myoview in 2018 showing region of inferior scar but no active ischemia.  ECG reviewed.  No changes were made today.  2.  Essential hypertension, blood pressure is well controlled today.  He remains on lisinopril and Lopressor.  3.  Mixed hyperlipidemia, on Mevacor.  Follow-up FLP and LFTs.  4.  OSA on CPAP.  He reports compliance.  Medication Adjustments/Labs and Tests Ordered: Current medicines are reviewed at length with the patient today.  Concerns regarding medicines are outlined above.   Tests Ordered: Orders Placed This Encounter  Procedures  . Hepatic function panel  . Lipid panel  . EKG 12-Lead    Medication Changes: No orders of the defined types were placed in this encounter.   Disposition:  Follow up 1 year in the [place] office.  Signed, [name], MD, [facility] 01/31/2019 8:54 AM    [facility] [place], [place], [place] [address] Phone: [phone]; Fax: [phone]

## 2019-01-31 NOTE — Patient Instructions (Addendum)
Medication Instructions:   Your physician recommends that you continue on your current medications as directed. Please refer to the Current Medication list given to you today.  Labwork:   Your physician recommends that you have a FASTING lipid/liver profile done as soon as possible. You can have this done at [facility] or [facility]. Please make sure you are fasting at least 8 hours when you have this done. Your lab orders have been given to you today.   Testing/Procedures:  NONE  Follow-Up:  Your physician recommends that you schedule a follow-up appointment in: 1 year. You will receive a reminder letter in the mail in about 10 months reminding you to call and schedule your appointment. If you don't receive this letter, please contact our office.  Any Other Special Instructions Will Be Listed Below (If Applicable).  If you need a refill on your cardiac medications before your next appointment, please call your pharmacy.

## 2019-02-07 ENCOUNTER — Other Ambulatory Visit: Payer: Self-pay | Admitting: Cardiology

## 2019-02-07 MED ORDER — LOVASTATIN 20 MG PO TABS: ORAL_TABLET | ORAL | 1 refills | Status: DC

## 2019-02-07 NOTE — Telephone Encounter (Signed)
Medication sent to pharmacy  

## 2019-02-07 NOTE — Telephone Encounter (Signed)
° ° ° °  1. Which medications need to be refilled? (please list name of each medication and dose if known)  lovastatin (MEVACOR) 20 MG tablet    2. Which pharmacy/location (including street and city if local pharmacy) is medication to be sent to?  [place], [place], [place]  3. Do they need a 30 day or 90 day supply?

## 2019-02-08 ENCOUNTER — Telehealth: Payer: Self-pay | Admitting: *Deleted

## 2019-02-08 LAB — HEPATIC FUNCTION PANEL
AG Ratio: 1.7 (calc) (ref 1.0–2.5)
ALT: 17 U/L (ref 9–46)
AST: 19 U/L (ref 10–35)
Albumin: 4.1 g/dL (ref 3.6–5.1)
Alkaline phosphatase (APISO): 54 U/L (ref 35–144)
Bilirubin, Direct: 0.2 mg/dL (ref 0.0–0.2)
Globulin: 2.4 g/dL (calc) (ref 1.9–3.7)
Indirect Bilirubin: 1 mg/dL (calc) (ref 0.2–1.2)
Total Bilirubin: 1.2 mg/dL (ref 0.2–1.2)
Total Protein: 6.5 g/dL (ref 6.1–8.1)

## 2019-02-08 LAB — LIPID PANEL
Cholesterol: 173 mg/dL (ref <200)
HDL: 38 mg/dL — ABNORMAL LOW (ref >=40)
LDL Cholesterol (Calc): 89 mg/dL (calc)
Non-HDL Cholesterol (Calc): 135 mg/dL (calc) — ABNORMAL HIGH (ref <130)
Total CHOL/HDL Ratio: 4.6 (calc) (ref <5.0)
Triglycerides: 344 mg/dL — ABNORMAL HIGH (ref <150)

## 2019-02-08 NOTE — Telephone Encounter (Signed)
-----   Message from [name], MD sent at 02/08/2019  8:01 AM EDT ----- Results reviewed.  LDL 89 and tolerating Mevacor so would continue for now.  Triglycerides are elevated at 344.  If he is taking omega-3 supplements only 1 tablet a day, would increase to twice daily.

## 2019-02-08 NOTE — Telephone Encounter (Signed)
Updated medication list

## 2019-02-08 NOTE — Telephone Encounter (Signed)
Pt voiced understanding - will increase omega-3 to twice daily - routed to pcp

## 2019-02-21 ENCOUNTER — Ambulatory Visit: Payer: Medicare Other | Admitting: General Surgery

## 2019-02-21 ENCOUNTER — Encounter: Payer: Self-pay | Admitting: General Surgery

## 2019-02-21 ENCOUNTER — Other Ambulatory Visit: Payer: Self-pay

## 2019-02-21 VITALS — BP 145/80 | HR 54 | Temp 96.2°F | Resp 16 | Ht 74.0 in | Wt 227.0 lb

## 2019-02-21 DIAGNOSIS — R918 Other nonspecific abnormal finding of lung field: Secondary | ICD-10-CM | POA: Diagnosis not present

## 2019-02-21 DIAGNOSIS — D373 Neoplasm of uncertain behavior of appendix: Secondary | ICD-10-CM

## 2019-02-21 DIAGNOSIS — K432 Incisional hernia without obstruction or gangrene: Secondary | ICD-10-CM

## 2019-02-21 NOTE — Patient Instructions (Signed)
CT a/p to be scheduled. Will see in February.

## 2019-02-21 NOTE — Progress Notes (Signed)
[facility] Note   HPI:  68 y.o. 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  He reports he well. He has gained some weight but has been eating more. He says his hernia has not bothered him. He is over all well and his family is well.   Review of Systems:  No increased abdominal distention No abdominal pain All other review of systems: otherwise negative   Vital Signs:  BP (!) 145/80 (BP Location: Left Arm, Patient Position: Sitting, Cuff Size: Normal)   Pulse (!) 54   Temp (!) 96.2 F (35.7 C) (Tympanic)   Resp 16   Ht 6\' 2"  (1.88 m)   Wt 227 lb (103 kg)   SpO2 96%   BMI 29.15 kg/m    Physical Exam:  Physical Exam Vitals signs reviewed.  HENT:     Head: Normocephalic.     Nose: Nose normal.  Neck:     Musculoskeletal: Normal range of motion.  Cardiovascular:     Rate and Rhythm: Normal rate.  Pulmonary:     Effort: Pulmonary effort is normal.  Abdominal:     General: There is no distension.     Palpations: Abdomen is soft.     Tenderness: There is no abdominal tenderness.     Hernia: A hernia is present. Hernia is present in the ventral area.     Comments: Small around umbilicus, some diastasis   Musculoskeletal: Normal range of motion.        General: No swelling.  Skin:    General: Skin is warm and dry.  Neurological:     General: No focal deficit present.     Mental Status: He is alert and oriented to person, place, and time.  Psychiatric:        Mood and Affect: Mood normal.        Behavior: Behavior normal.        Thought Content: Thought content normal.        Judgment: Judgment normal.     Assessment:  68 y.o. 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 He will need surveillance CT every 6 months for 3 years. He also had incidentally found lung nodules, and I had been ordered these CT to get surveillance  given that we are doing the other CT a/p.  Plan:  - Diet and activity as tolerated - CT a/p with oral and IV contrast ordered for now  - Monitor hernia, risk of incarceration/ strangulation and reasons to go to ED discussed  - Will need CT chest February 2021 - Will need repeat CT a/p with IV and po contrast 2021 and every 6 months until February of 2022 - Follow up February 2021   All of the above recommendations were discussed with the patient and all of patient's questions were answered to his expressed satisfaction.  Curlene Labrum, MD [facility] [address] [place], [place] [address] [phone] (office)

## 2019-03-06 ENCOUNTER — Ambulatory Visit: Payer: Medicare Other | Admitting: "Endocrinology

## 2019-03-07 LAB — COMPLETE METABOLIC PANEL WITH GFR
AG Ratio: 1.9 (calc) (ref 1.0–2.5)
ALT: 22 U/L (ref 9–46)
AST: 21 U/L (ref 10–35)
Albumin: 4.3 g/dL (ref 3.6–5.1)
Alkaline phosphatase (APISO): 63 U/L (ref 35–144)
BUN/Creatinine Ratio: 11 (calc) (ref 6–22)
BUN: 15 mg/dL (ref 7–25)
CO2: 26 mmol/L (ref 20–32)
Calcium: 9.6 mg/dL (ref 8.6–10.3)
Chloride: 105 mmol/L (ref 98–110)
Creat: 1.35 mg/dL — ABNORMAL HIGH (ref 0.70–1.25)
GFR, Est African American: 63 mL/min/{1.73_m2} (ref >=60)
GFR, Est Non African American: 54 mL/min/{1.73_m2} — ABNORMAL LOW (ref >=60)
Globulin: 2.3 g/dL (calc) (ref 1.9–3.7)
Glucose, Bld: 164 mg/dL — ABNORMAL HIGH (ref 65–99)
Potassium: 4.3 mmol/L (ref 3.5–5.3)
Sodium: 139 mmol/L (ref 135–146)
Total Bilirubin: 0.9 mg/dL (ref 0.2–1.2)
Total Protein: 6.6 g/dL (ref 6.1–8.1)

## 2019-03-07 LAB — HEMOGLOBIN A1C
Hgb A1c MFr Bld: 6.6 % of total Hgb — ABNORMAL HIGH (ref <5.7)
Mean Plasma Glucose: 143 (calc)
eAG (mmol/L): 7.9 (calc)

## 2019-03-09 ENCOUNTER — Other Ambulatory Visit: Payer: Self-pay

## 2019-03-09 ENCOUNTER — Ambulatory Visit (HOSPITAL_COMMUNITY)
Admission: RE | Admit: 2019-03-09 | Discharge: 2019-03-09 | Disposition: A | Discharge status: Home / Self Care | Payer: Medicare Other | Source: Ambulatory Visit | Attending: General Surgery | Admitting: General Surgery

## 2019-03-09 DIAGNOSIS — D373 Neoplasm of uncertain behavior of appendix: Secondary | ICD-10-CM | POA: Diagnosis present

## 2019-03-09 MED ORDER — IOHEXOL 300 MG/ML  SOLN
100.0000 mL | Freq: Once | INTRAMUSCULAR | Status: AC | PRN
  Administered 2019-03-09: 100 mL via INTRAVENOUS

## 2019-03-13 ENCOUNTER — Telehealth: Payer: Self-pay | Admitting: General Surgery

## 2019-03-13 NOTE — Telephone Encounter (Signed)
[facility]  Spoke with wife, [name] [name], told her CT results. No mass, no ascites, everything looks fine on CT.  CT a/p 03/09/2019 CLINICAL DATA:  Neoplasm of uncertain behavior of appendix low-grade mucinous neoplasm appendix.  EXAM: CT ABDOMEN AND PELVIS WITH CONTRAST  TECHNIQUE: Multidetector CT imaging of the abdomen and pelvis was performed using the standard protocol following bolus administration of intravenous contrast.  CONTRAST:  158mL OMNIPAQUE IOHEXOL 300 MG/ML  SOLN  COMPARISON:  CT scan of April 04, 2017.  FINDINGS: Lower chest: 5 mm subpleural nodule is noted in right lower lobe which is not significantly changed compared to prior exam. This can be considered benign at this point with no further follow-up required.  Hepatobiliary: No focal liver abnormality is seen. No gallstones, gallbladder wall thickening, or biliary dilatation.  Pancreas: Unremarkable. No pancreatic ductal dilatation or surrounding inflammatory changes.  Spleen: Normal in size without focal abnormality.  Adrenals/Urinary Tract: Adrenal glands appear normal. Right renal cyst is noted. No hydronephrosis or renal obstruction is noted. No renal or ureteral calculi are noted. Urinary bladder is unremarkable.  Stomach/Bowel: The stomach appears normal. There is no evidence of bowel obstruction or inflammation. Status post appendectomy and surgical removal of appendiceal mass. No residual mass is noted.  Vascular/Lymphatic: No significant vascular findings are present. No enlarged abdominal or pelvic lymph nodes.  Reproductive: Prostate is unremarkable.  Other: Small bilateral fat containing inguinal hernias are noted.  Musculoskeletal: No acute or significant osseous findings.  IMPRESSION: This status post surgical removal of appendiceal mass noted on prior exam. No residual mass is noted.  Small bilateral fat containing inguinal hernias are  noted.   Electronically Signed   By: Marijo Conception M.D.   On: 03/09/2019 12:48 [name] Labrum, MD [facility] [address] [place], [place] [address] [phone] (office)

## 2019-03-14 ENCOUNTER — Ambulatory Visit (INDEPENDENT_AMBULATORY_CARE_PROVIDER_SITE_OTHER): Payer: Medicare Other | Admitting: "Endocrinology

## 2019-03-14 ENCOUNTER — Encounter: Payer: Self-pay | Admitting: "Endocrinology

## 2019-03-14 ENCOUNTER — Other Ambulatory Visit: Payer: Self-pay

## 2019-03-14 DIAGNOSIS — I10 Essential (primary) hypertension: Secondary | ICD-10-CM

## 2019-03-14 DIAGNOSIS — E782 Mixed hyperlipidemia: Secondary | ICD-10-CM | POA: Diagnosis not present

## 2019-03-14 DIAGNOSIS — N183 Chronic kidney disease, stage 3 (moderate): Secondary | ICD-10-CM

## 2019-03-14 DIAGNOSIS — I129 Hypertensive chronic kidney disease with stage 1 through stage 4 chronic kidney disease, or unspecified chronic kidney disease: Secondary | ICD-10-CM | POA: Diagnosis not present

## 2019-03-14 DIAGNOSIS — E1122 Type 2 diabetes mellitus with diabetic chronic kidney disease: Secondary | ICD-10-CM | POA: Diagnosis not present

## 2019-03-14 NOTE — Progress Notes (Signed)
03/14/2019                                                    Endocrinology Telehealth Visit Follow up Note -During COVID -19 Pandemic  This visit type was conducted due to national recommendations for restrictions regarding the COVID-19 Pandemic  in an effort to limit this patient's exposure and mitigate transmission of the corona virus.  Due to his co-morbid illnesses, [name] [name] is at  moderate to high risk for complications without adequate follow up.  This format is felt to be most appropriate for him at this time.  I connected with this patient on 03/14/2019   by telephone and verified that I am speaking with the correct person using two identifiers. [name] [name], 1951/05/24. he has verbally consented to this visit. All issues noted in this document were discussed and addressed. The format was not optimal for physical exam.     Subjective:    Patient ID: [name] [name], male    DOB: Aug 27, 1950, PCP Rory Percy, MD   Past Medical History:  Diagnosis Date  . Anxiety   . 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 Arthritis   . Coronary artery disease    NSTEMI [id number]; multivessel CAD s/p post CABG (LIMA-LAD, SVG-D1, SVG-D2, SVG-OM)  . Essential hypertension   . GERD (gastroesophageal reflux disease)   . Gout   . History of kidney stones   . History of peptic ulcer disease   . Hyperlipidemia   . Left ureteral stone   . Nodule of right lung    CT 04-04-2017  right lower lobe multiple nodules  . OSA on CPAP   . Renal cyst, right    CT 04-04-2017  . Renal insufficiency   . Type 2 diabetes mellitus ([place])   . Wears glasses    Past Surgical History:  Procedure Laterality Date  . CARDIOVASCULAR STRESS TEST  11-13-2016   dr Domenic Polite   Intermediate risk nuclear study w/ medium defect of moderate severity in the basal inferoseptal, basal inferior, mid inferoseptal and mid inferior location (findings consistant w/ prior myocardial  infarction)/  mild enlarged LV cavity size;  nuclear stress EF 45%  . COLONOSCOPY N/A 04/13/2017   Procedure: COLONOSCOPY;  Surgeon: Aviva Signs, MD;  [facility];  Service: Gastroenterology;  Laterality: N/A;  . CORONARY ARTERY BYPASS GRAFT N/A 04/25/2013   Procedure: CORONARY ARTERY BYPASS GRAFTING (CABG);  Surgeon: [name], MD;  Location: [facility];  Service: Open Heart Surgery;  Laterality: N/A;  Coronary artery bypass graft times four, on pump, using left internal mammary artery and right greater saphenous vein via endovein harvest.  . CYSTOSCOPY WITH RETROGRADE PYELOGRAM, URETEROSCOPY AND STENT PLACEMENT Left 04/08/2017   Procedure: CYSTOSCOPY WITH RETROGRADE PYELOGRAM, URETEROSCOPY,STENT PLACEMENT;  Surgeon: Lucas Mallow, MD;  [facility];  Service: Urology;  Laterality: Left;  . CYSTOSCOPY/URETEROSCOPY/HOLMIUM LASER/STENT PLACEMENT Left 05/17/2017   Procedure: CYSTOSCOPY / RETROGRADE PYELOGRAM / URETEROSCOPY / HOLMIUM LASER / STONE BASKETRY / STENT EXCHANGE;  Surgeon: Lucas Mallow, MD;  Location: [facility];  Service: Urology;  Laterality: Left;  ONLY NEEDS 45 MIN FOR PROCEDURE  . ELBOW BURSA SURGERY Right 03-10-2017     at [facility]   and removal of spur  .  INTRAOPERATIVE TRANSESOPHAGEAL ECHOCARDIOGRAM N/A 04/25/2013   Procedure: INTRAOPERATIVE TRANSESOPHAGEAL ECHOCARDIOGRAM;  Surgeon: [name], MD;  Location: [facility] OR;  Service: Open Heart Surgery;  Laterality: N/A;  . KNEE ARTHROSCOPY Left 10/ 2017  approx.    dr Theda Sers  . LAPAROSCOPIC RIGHT HEMI COLECTOMY Right 04/19/2017   Procedure: OPEN RIGHT HEMI COLECTOMY;  Surgeon: Virl Cagey, MD;  Location: [facility];  Service: General;  Laterality: Right;  . LAPAROSCOPY N/A 04/19/2017   Procedure: LAPAROSCOPY DIAGNOSTIC;  Surgeon: Virl Cagey, MD;  Location: [facility];  Service: General;  Laterality: N/A;  patient knows to arrive at 6:15  . LEFT HEART CATHETERIZATION WITH  CORONARY ANGIOGRAM N/A 04/20/2013   Procedure: LEFT HEART CATHETERIZATION WITH CORONARY ANGIOGRAM;  Surgeon: [name], MD;  Location: [facility] CATH LAB;  Service: Cardiovascular;  Laterality: N/A;  . NASAL SEPTUM SURGERY  1990s approx  . TONSILLECTOMY  62 (age 37)  . TRANSTHORACIC ECHOCARDIOGRAM  11-17-2016   dr Domenic Polite   mild LVH,  ef 55-60%, LV wall motion & diastolic function indeterminant assessment,  images were inadequate/  mild LAE/  trivial Tr   Social History   Socioeconomic History  . Marital status: Married    Spouse name: Not on file  . Number of children: Not on file  . Years of education: Not on file  . Highest education level: Not on file  Occupational History  . Occupation: Drives Chief Operating Officer: [employer]  Social Needs  . Financial resource strain: Not on file  . Food insecurity    Worry: Not on file    Inability: Not on file  . Transportation needs    Medical: Not on file    Non-medical: Not on file  Tobacco Use  . Smoking status: Never Smoker  . Smokeless tobacco: Never Used  Substance and Sexual Activity  . Alcohol use: No    Alcohol/week: 0.0 standard drinks  . Drug use: No  . Sexual activity: Yes    Birth control/protection: None  Lifestyle  . Physical activity    Days per week: Not on file    Minutes per session: Not on file  . Stress: Not on file  Relationships  . Social Herbalist on phone: Not on file    Gets together: Not on file    Attends religious service: Not on file    Active member of club or organization: Not on file    Attends meetings of clubs or organizations: Not on file    Relationship status: Not on file  Other Topics Concern  . Not on file  Social History Narrative   Lives with wife, does not exercise but part of his job is strenuous. Works in a body-shop part-time, too.   2 of his mother's sisters and 1 brother died suddenly (family was told MI but no autopsy).    Outpatient Encounter Medications as  of 03/14/2019  Medication Sig  . Ascorbic Acid (VITAMIN C) 1000 MG tablet Take 1,000 mg by mouth daily.  Marland Kitchen aspirin EC 325 MG EC tablet Take 1 tablet (325 mg total) by mouth daily. (Patient taking differently: Take 325 mg by mouth every morning. )  . colchicine 0.6 MG tablet Take 0.6 mg by mouth daily.   Marland Kitchen escitalopram (LEXAPRO) 10 MG tablet Take 10 mg by mouth at bedtime.   Marland Kitchen lisinopril (PRINIVIL,ZESTRIL) 10 MG tablet Take 5 mg by mouth every morning.   . lovastatin (MEVACOR) 20 MG  tablet TAKE ONE TABLET BY MOUTH AT BEDTIME, STOP TAKING ATORVASTATIN  . MAGNESIUM CITRATE PO Take 1 Bottle by mouth once.  . metFORMIN (GLUCOPHAGE) 500 MG tablet Take 1 tablet by mouth twice daily  . metoprolol tartrate (LOPRESSOR) 25 MG tablet Take 0.5 tablets (12.5 mg total) by mouth 2 (two) times daily.  . Multiple Vitamin (MULTIVITAMIN WITH MINERALS) TABS tablet Take 1 tablet by mouth daily.  . Omega-3 Fatty Acids (FISH OIL) 1000 MG CPDR Take 1 capsule by mouth 2 (two) times a day.   Marland Kitchen omeprazole (PRILOSEC) 20 MG capsule Take 20 mg by mouth every morning.   . pyridoxine (B-6) 200 MG tablet Take 200 mg by mouth daily.   No facility-administered encounter medications on file as of 03/14/2019.    ALLERGIES: No Known Allergies VACCINATION STATUS: Immunization History  Administered Date(s) Administered  . Influenza Split 02/10/2014  . Influenza,inj,Quad PF,6+ Mos 04/20/2017    Diabetes He presents for his follow-up diabetic visit. He has type 2 diabetes mellitus. His disease course has been stable. There are no hypoglycemic associated symptoms. Pertinent negatives for hypoglycemia include no confusion, headaches, pallor or seizures. Pertinent negatives for diabetes include no chest pain, no fatigue, no polydipsia, no polyphagia, no polyuria and no weakness. There are no hypoglycemic complications. Symptoms are stable. Diabetic complications include heart disease and nephropathy. Risk factors for coronary artery  disease include diabetes mellitus, dyslipidemia, hypertension, male sex and sedentary lifestyle. He is compliant with treatment most of the time. He is following a generally unhealthy diet. When asked about meal planning, he reported none. He never participates in exercise. An ACE inhibitor/angiotensin II receptor blocker is being taken.  Hyperlipidemia This is a chronic problem. The current episode started more than 1 year ago. Exacerbating diseases include diabetes. Pertinent negatives include no chest pain, myalgias or shortness of breath. Current antihyperlipidemic treatment includes statins. Risk factors for coronary artery disease include diabetes mellitus, dyslipidemia, hypertension, male sex and a sedentary lifestyle.  Hypertension This is a chronic problem. The current episode started more than 1 year ago. The problem is controlled. Pertinent negatives include no chest pain, headaches, neck pain, palpitations or shortness of breath. Risk factors for coronary artery disease include dyslipidemia, diabetes mellitus, male gender and sedentary lifestyle. Past treatments include beta blockers and ACE inhibitors.     Objective:    There were no vitals taken for this visit.  Wt Readings from Last 3 Encounters:  02/21/19 227 lb (103 kg)  01/31/19 224 lb (101.6 kg)  09/06/18 220 lb 12.8 oz (100.2 kg)      CMP     Component Value Date/Time   NA 139 03/06/2019 0948   K 4.3 03/06/2019 0948   CL 105 03/06/2019 0948   CO2 26 03/06/2019 0948   GLUCOSE 164 (H) 03/06/2019 0948   BUN 15 03/06/2019 0948   CREATININE 1.35 (H) 03/06/2019 0948   CALCIUM 9.6 03/06/2019 0948   PROT 6.6 03/06/2019 0948   ALBUMIN 3.8 04/04/2017 1134   AST 21 03/06/2019 0948   ALT 22 03/06/2019 0948   ALKPHOS 102 04/04/2017 1134   BILITOT 0.9 03/06/2019 0948   GFRNONAA 54 (L) 03/06/2019 0948   GFRAA 63 03/06/2019 0948     Diabetic Labs (most recent): Lab Results  Component Value Date   HGBA1C 6.6 (H)  03/06/2019   HGBA1C 6.3 (H) 10/27/2018   HGBA1C 8.3 (H) 07/25/2018     Lipid Panel ( most recent) Lipid Panel     Component  Value Date/Time   CHOL 173 02/07/2019 0921   TRIG 344 (H) 02/07/2019 0921   HDL 38 (L) 02/07/2019 0921   CHOLHDL 4.6 02/07/2019 0921   VLDL 21 07/02/2016 0853   LDLCALC 89 02/07/2019 0921     Assessment & Plan:   1. Type 2 diabetes mellitus with other circulatory complications , and acute on chronic renal insufficiency   His diabetes is  complicated by coronary artery disease status post coronary artery bypass graft , acute on chronic renal insufficiency and patient remains at a high risk for more acute and chronic complications of diabetes which include CAD, CVA, CKD, retinopathy, and neuropathy. These are all discussed in detail with the patient.  -  His previsit labs show A1c of 6.6% improving from 8.3%.   - Recent labs reviewed.  - I have re-counseled the patient on diet management  by adopting a carbohydrate restricted / protein rich  Diet.  -He admits to dietary indiscretions including consumption of sweets and sweetened beverages. - he  admits there is a room for improvement in his diet and drink choices. -  Suggestion is made for him to avoid simple carbohydrates  from his diet including Cakes, Sweet Desserts / Pastries, Ice Cream, Soda (diet and regular), Sweet Tea, Candies, Chips, Cookies, Sweet Pastries,  Store Bought Juices, Alcohol in Excess of  1-2 drinks a day, Artificial Sweeteners, Coffee Creamer, and "Sugar-free" Products. This will help patient to have stable blood glucose profile and potentially avoid unintended weight gain.   - Patient is advised to stick to a routine mealtimes to eat 3 meals  a day and avoid unnecessary snacks ( to snack only to correct hypoglycemia).  - I have approached patient with the following individualized plan to manage diabetes and patient agrees.  -Given his improved A1c of 6.6%, he will not require insulin  treatment at this time.    -He is willing to continue on modified lifestyle including consumption of less carbohydrates.     -He is advised to continue metformin 500 mg p.o. twice daily--after breakfast and after supper.  He will be considered for low-dose glipizide if he loses control of diabetes during his subsequent visits, with A1c above 8%.  - I have advised patient to keep adequate hydration and will repeat his renal function before his next visit. -He is approached to start monitoring blood glucose at least one time daily before breakfast and report if blood glucose readings are above 200 mg/dL 3 times a week.    2) BP/HTN: he is advised to home monitor blood pressure and report if > 140/90 on 2 separate readings.   He is advised to continue his current blood pressure medications including metoprolol and lisinopril.     3) Lipids/HPL: He has uncontrolled lipid panel with LDL of 94.  He is advised continue lovastatin 20 mg p.o. nightly.  He has hypertriglyceridemia, advised to continue his fish oil 2 g p.o. twice daily.    4) Chronic Care/Health Maintenance:  -Patient is on  Statin medications and encouraged to continue to follow up with Ophthalmology, Podiatrist at least yearly or according to recommendations, and advised to  stay away from smoking. I have recommended yearly flu vaccine and pneumonia vaccination at least every 5 years; moderate intensity exercise for up to 150 minutes weekly; and  sleep for at least 7 hours a day.  - I advised patient to maintain close follow up with Rory Percy, MD for primary care needs.  - Patient  Care Time Today:  25 min, of which >50% was spent in  counseling and the rest reviewing his  current and  previous labs/studies, previous treatments, his blood glucose readings, and medications' doses and developing a plan for long-term care based on the latest recommendations for standards of care.   [name] [name] participated in the discussions,  expressed understanding, and voiced agreement with the above plans.  All questions were answered to his satisfaction. he is encouraged to contact clinic should he have any questions or concerns prior to his return visit.  Follow up plan: -Return in about 6 months (around 09/11/2019) for Follow up with Pre-visit Labs, Next Visit A1c in Office.    Glade Lloyd, MD Phone: [phone]  Fax: [phone]   -  This note was partially dictated with voice recognition software. Similar sounding words can be transcribed inadequately or may not  be corrected upon review.  03/14/2019, 4:16 PM

## 2019-05-16 ENCOUNTER — Other Ambulatory Visit: Payer: Self-pay | Admitting: "Endocrinology

## 2019-07-24 ENCOUNTER — Ambulatory Visit: Payer: Medicare PPO | Attending: Internal Medicine

## 2019-07-24 ENCOUNTER — Other Ambulatory Visit: Payer: Self-pay

## 2019-07-24 DIAGNOSIS — Z20822 Contact with and (suspected) exposure to covid-19: Secondary | ICD-10-CM

## 2019-07-25 LAB — NOVEL CORONAVIRUS, NAA: SARS-CoV-2, NAA: NOT DETECTED

## 2019-08-08 ENCOUNTER — Telehealth: Payer: Self-pay | Admitting: General Surgery

## 2019-08-08 DIAGNOSIS — K388 Other specified diseases of appendix: Secondary | ICD-10-CM

## 2019-08-08 DIAGNOSIS — D373 Neoplasm of uncertain behavior of appendix: Secondary | ICD-10-CM

## 2019-08-08 DIAGNOSIS — R918 Other nonspecific abnormal finding of lung field: Secondary | ICD-10-CM

## 2019-08-08 NOTE — Telephone Encounter (Signed)
Order surveillance CT chest for lung nodules and CT a/p for low grade mucinous neoplasm.  Curlene Labrum, MD [facility] [address] [place], [place] [address] [id number] [phone] (office)

## 2019-09-05 LAB — COMPLETE METABOLIC PANEL WITH GFR
AG Ratio: 2 (calc) (ref 1.0–2.5)
ALT: 20 U/L (ref 9–46)
AST: 22 U/L (ref 10–35)
Albumin: 4.2 g/dL (ref 3.6–5.1)
Alkaline phosphatase (APISO): 65 U/L (ref 35–144)
BUN/Creatinine Ratio: 12 (calc) (ref 6–22)
BUN: 16 mg/dL (ref 7–25)
CO2: 29 mmol/L (ref 20–32)
Calcium: 9.3 mg/dL (ref 8.6–10.3)
Chloride: 106 mmol/L (ref 98–110)
Creat: 1.33 mg/dL — ABNORMAL HIGH (ref 0.70–1.25)
GFR, Est African American: 64 mL/min/{1.73_m2} (ref >=60)
GFR, Est Non African American: 55 mL/min/{1.73_m2} — ABNORMAL LOW (ref >=60)
Globulin: 2.1 g/dL (calc) (ref 1.9–3.7)
Glucose, Bld: 140 mg/dL — ABNORMAL HIGH (ref 65–99)
Potassium: 4.5 mmol/L (ref 3.5–5.3)
Sodium: 142 mmol/L (ref 135–146)
Total Bilirubin: 1.1 mg/dL (ref 0.2–1.2)
Total Protein: 6.3 g/dL (ref 6.1–8.1)

## 2019-09-05 LAB — LIPID PANEL
Cholesterol: 152 mg/dL (ref <200)
HDL: 38 mg/dL — ABNORMAL LOW (ref >=40)
LDL Cholesterol (Calc): 87 mg/dL (calc)
Non-HDL Cholesterol (Calc): 114 mg/dL (calc) (ref <130)
Total CHOL/HDL Ratio: 4 (calc) (ref <5.0)
Triglycerides: 172 mg/dL — ABNORMAL HIGH (ref <150)

## 2019-09-05 LAB — VITAMIN D 25 HYDROXY (VIT D DEFICIENCY, FRACTURES): Vit D, 25-Hydroxy: 29 ng/mL — ABNORMAL LOW (ref 30–100)

## 2019-09-06 ENCOUNTER — Ambulatory Visit (HOSPITAL_COMMUNITY)
Admission: RE | Admit: 2019-09-06 | Discharge: 2019-09-06 | Disposition: A | Discharge status: Home / Self Care | Payer: Medicare PPO | Source: Ambulatory Visit | Attending: General Surgery | Admitting: General Surgery

## 2019-09-06 ENCOUNTER — Other Ambulatory Visit: Payer: Self-pay

## 2019-09-06 DIAGNOSIS — D373 Neoplasm of uncertain behavior of appendix: Secondary | ICD-10-CM | POA: Diagnosis present

## 2019-09-06 DIAGNOSIS — R918 Other nonspecific abnormal finding of lung field: Secondary | ICD-10-CM | POA: Insufficient documentation

## 2019-09-06 MED ORDER — IOHEXOL 300 MG/ML  SOLN
100.0000 mL | Freq: Once | INTRAMUSCULAR | Status: AC | PRN
  Administered 2019-09-06: 13:00:00 100 mL via INTRAVENOUS

## 2019-09-07 ENCOUNTER — Other Ambulatory Visit: Payer: Self-pay

## 2019-09-07 ENCOUNTER — Encounter: Payer: Self-pay | Admitting: General Surgery

## 2019-09-07 ENCOUNTER — Ambulatory Visit: Payer: Medicare PPO | Admitting: General Surgery

## 2019-09-07 VITALS — BP 129/73 | HR 63 | Temp 97.3°F | Resp 12 | Ht 72.0 in | Wt 219.0 lb

## 2019-09-07 DIAGNOSIS — R918 Other nonspecific abnormal finding of lung field: Secondary | ICD-10-CM | POA: Diagnosis not present

## 2019-09-07 DIAGNOSIS — D373 Neoplasm of uncertain behavior of appendix: Secondary | ICD-10-CM | POA: Diagnosis not present

## 2019-09-11 ENCOUNTER — Encounter: Payer: Self-pay | Admitting: General Surgery

## 2019-09-11 NOTE — Progress Notes (Signed)
[facility] Note   HPI:  69 y.o. 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 He has been getting surveillance every 6 months for 3 years per surgical oncology expert recommendation.  He has been doing well and has no complaints. Overall he is feeling well. He also has incidentally found lung nodules that needed surveillance annually per radiology recommendations, and these have been stable.   Review of Systems:  No fever or chills No flushing Tolerating diet Good pain control  All other review of systems: otherwise negative   Vital Signs:  BP 129/73   Pulse 63   Temp (!) 97.3 F (36.3 C) (Oral)   Resp 12   Ht 6' (1.829 m)   Wt 219 lb (99.3 kg)   SpO2 97%   BMI 29.70 kg/m    Physical Exam:  Physical Exam Vitals reviewed.  Constitutional:      Appearance: He is normal weight.  HENT:     Head: Normocephalic.     Nose: Nose normal.     Mouth/Throat:     Mouth: Mucous membranes are moist.  Eyes:     Pupils: Pupils are equal, round, and reactive to light.  Cardiovascular:     Rate and Rhythm: Normal rate and regular rhythm.  Abdominal:     General: There is no distension.     Palpations: Abdomen is soft.     Tenderness: There is no abdominal tenderness.     Comments: Small bulge right of midline, small hernia, nontender  Musculoskeletal:        General: Normal range of motion.     Cervical back: Normal range of motion.  Skin:    General: Skin is warm.  Neurological:     General: No focal deficit present.     Mental Status: He is alert and oriented to person, place, and time.  Psychiatric:        Mood and Affect: Mood normal.        Behavior: Behavior normal.        Thought Content: Thought content normal.        Judgment: Judgment normal.     Imaging:  Personally reviewed CT- no evidence of recurrent disease or  adenopathy  CLINICAL DATA:  Patient with history of mucinous neoplasm of the appendix status post surgical resection.  EXAM: CT CHEST, ABDOMEN, AND PELVIS WITH CONTRAST  TECHNIQUE: Multidetector CT imaging of the chest, abdomen and pelvis was performed following the standard protocol during bolus administration of intravenous contrast.  CONTRAST:  173mL OMNIPAQUE IOHEXOL 300 MG/ML  SOLN  COMPARISON:  Chest abdomen pelvis CT 08/29/2018; abdomen pelvis CT 03/09/2019.  FINDINGS: CT CHEST FINDINGS  Cardiovascular: Heart is mildly enlarged. Aorta and main pulmonary artery are normal in caliber. Thoracic aortic vascular calcifications.  Mediastinum/Nodes: No enlarged axillary, mediastinal or hilar lymphadenopathy. Normal appearance of the esophagus.  Lungs/Pleura: Central airways are patent. Dependent atelectasis within the right greater than left lower lobes. Stable 4 mm left lower lobe pulmonary nodule (image 119; series 4). Stable 3 mm right lower lobe nodule (image 114; series 4). Stable 1 cm nodule within the inferior aspect of the right middle lobe (image 99; series 4). No new or enlarging pulmonary nodules. No pleural effusion or pneumothorax.  Musculoskeletal: Status post median sternotomy. Thoracic spine degenerative changes. No aggressive or acute appearing osseous lesions.  CT ABDOMEN PELVIS  FINDINGS  Hepatobiliary: Liver is normal in size and contour. No focal hepatic lesion identified. Gallbladder is unremarkable. No intrahepatic or extrahepatic biliary ductal dilatation.  Pancreas: Unremarkable  Spleen: Unremarkable  Adrenals/Urinary Tract: Normal adrenal glands. Unchanged cyst superior pole right kidney. Kidneys enhance symmetrically with contrast. No hydronephrosis. Urinary bladder is unremarkable.  Stomach/Bowel: Oral contrast material the level of the rectum. Stable postsurgical changes within the right lower quadrant. No evidence for  small bowel obstruction. No free fluid or free intraperitoneal air. Normal morphology of the stomach.  Vascular/Lymphatic: Normal caliber abdominal aorta. Peripheral calcified atherosclerotic plaque. No retroperitoneal lymphadenopathy.  Reproductive: Heterogeneous prostate.  Other: Bilateral fat containing inguinal hernias, left greater than right.  Musculoskeletal: Stable postsurgical changes involving the anterior abdominal wall lumbar spine degenerative changes. No aggressive or acute appearing osseous lesions.  IMPRESSION: 1. No evidence for locally recurrent or metastatic disease in the chest, abdomen or pelvis. 2. Stable pulmonary nodules. Given stability over time these are favored to be benign in etiology. Recommend continued attention on follow-up.  Aortic Atherosclerosis (ICD10-I70.0).   Electronically Signed   By: Lovey Newcomer M.D.   On: 09/07/2019 09:17  Assessment:  69 y.o. yo Male with a history of a low grade mucinous neoplasm pT3a(LAMN)N0 s/p right hemicolectomy 04/2017 non ruptured. I had asked surgical oncology recommendations for surveillance and at the time they recommended CT for 6 months for 3 years and then annual for 2 years.  Newer recommendations say that surveillance is likely not needed for a LAMN. Will proceed with one more CT to complete the 3 year follow up recommendations, and from there I think we can stop pending no changes. CT chest for pulmonary nodules is separate and these are stable over 3 CT chest.   Plan:  - Will see patient back in September for plans for his repeat CT a/p and this will likely be the last CT a/p   - He will call with concerns   All of the above recommendations were discussed with the patient, and all of patient's questions were answered to his satisfaction.  Curlene Labrum, MD [facility] [address] [place], [place] [address] [phone] (office)

## 2019-09-13 ENCOUNTER — Other Ambulatory Visit: Payer: Self-pay

## 2019-09-13 ENCOUNTER — Ambulatory Visit (INDEPENDENT_AMBULATORY_CARE_PROVIDER_SITE_OTHER): Payer: Medicare PPO | Admitting: "Endocrinology

## 2019-09-13 ENCOUNTER — Encounter: Payer: Self-pay | Admitting: "Endocrinology

## 2019-09-13 VITALS — BP 117/77 | HR 59 | Ht 72.0 in | Wt 215.6 lb

## 2019-09-13 DIAGNOSIS — N1831 Chronic kidney disease, stage 3a: Secondary | ICD-10-CM | POA: Diagnosis not present

## 2019-09-13 DIAGNOSIS — E782 Mixed hyperlipidemia: Secondary | ICD-10-CM

## 2019-09-13 DIAGNOSIS — E1121 Type 2 diabetes mellitus with diabetic nephropathy: Secondary | ICD-10-CM

## 2019-09-13 DIAGNOSIS — I10 Essential (primary) hypertension: Secondary | ICD-10-CM

## 2019-09-13 LAB — POCT GLYCOSYLATED HEMOGLOBIN (HGB A1C): Hemoglobin A1C: 6.6 % — AB (ref 4.0–5.6)

## 2019-09-13 NOTE — Progress Notes (Signed)
09/13/2019        Endocrinology follow-up note     Subjective:    Patient ID: [name] [name], male    DOB: 12-Dec-1950, PCP Rory Percy, MD   Past Medical History:  Diagnosis Date  . Anxiety   . 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 Arthritis   . Coronary artery disease    NSTEMI [id number]; multivessel CAD s/p post CABG (LIMA-LAD, SVG-D1, SVG-D2, SVG-OM)  . Essential hypertension   . GERD (gastroesophageal reflux disease)   . Gout   . History of kidney stones   . History of peptic ulcer disease   . Hyperlipidemia   . Left ureteral stone   . Nodule of right lung    CT 04-04-2017  right lower lobe multiple nodules  . OSA on CPAP   . Renal cyst, right    CT 04-04-2017  . Renal insufficiency   . Type 2 diabetes mellitus ([place])   . Wears glasses    Past Surgical History:  Procedure Laterality Date  . CARDIOVASCULAR STRESS TEST  11-13-2016   dr Domenic Polite   Intermediate risk nuclear study w/ medium defect of moderate severity in the basal inferoseptal, basal inferior, mid inferoseptal and mid inferior location (findings consistant w/ prior myocardial infarction)/  mild enlarged LV cavity size;  nuclear stress EF 45%  . COLONOSCOPY N/A 04/13/2017   Procedure: COLONOSCOPY;  Surgeon: Aviva Signs, MD;  Location: [facility];  Service: Gastroenterology;  Laterality: N/A;  . CORONARY ARTERY BYPASS GRAFT N/A 04/25/2013   Procedure: CORONARY ARTERY BYPASS GRAFTING (CABG);  Surgeon: [name], MD;  Location: [facility];  Service: Open Heart Surgery;  Laterality: N/A;  Coronary artery bypass graft times four, on pump, using left internal mammary artery and right greater saphenous vein via endovein harvest.  . CYSTOSCOPY WITH RETROGRADE PYELOGRAM, URETEROSCOPY AND STENT PLACEMENT Left 04/08/2017   Procedure: CYSTOSCOPY WITH RETROGRADE PYELOGRAM, URETEROSCOPY,STENT PLACEMENT;  Surgeon: Lucas Mallow, MD;  Location: [facility];   Service: Urology;  Laterality: Left;  . CYSTOSCOPY/URETEROSCOPY/HOLMIUM LASER/STENT PLACEMENT Left 05/17/2017   Procedure: CYSTOSCOPY / RETROGRADE PYELOGRAM / URETEROSCOPY / HOLMIUM LASER / STONE BASKETRY / STENT EXCHANGE;  Surgeon: Lucas Mallow, MD;  Location: [place];  Service: Urology;  Laterality: Left;  ONLY NEEDS 45 MIN FOR PROCEDURE  . ELBOW BURSA SURGERY Right 03-10-2017     at [facility]   and removal of spur  . INTRAOPERATIVE TRANSESOPHAGEAL ECHOCARDIOGRAM N/A 04/25/2013   Procedure: INTRAOPERATIVE TRANSESOPHAGEAL ECHOCARDIOGRAM;  Surgeon: [name], MD;  Location: [facility] OR;  Service: Open Heart Surgery;  Laterality: N/A;  . KNEE ARTHROSCOPY Left 10/ 2017  approx.    dr Theda Sers  . LAPAROSCOPIC RIGHT HEMI COLECTOMY Right 04/19/2017   Procedure: OPEN RIGHT HEMI COLECTOMY;  Surgeon: Virl Cagey, MD;  Location: [facility];  Service: General;  Laterality: Right;  . LAPAROSCOPY N/A 04/19/2017   Procedure: LAPAROSCOPY DIAGNOSTIC;  Surgeon: Virl Cagey, MD;  Location: [facility];  Service: General;  Laterality: N/A;  patient knows to arrive at 6:15  . LEFT HEART CATHETERIZATION WITH CORONARY ANGIOGRAM N/A 04/20/2013   Procedure: LEFT HEART CATHETERIZATION WITH CORONARY ANGIOGRAM;  Surgeon: [name], MD;  Location: [facility] CATH [facility];  Service: Cardiovascular;  Laterality: N/A;  . NASAL SEPTUM SURGERY  1990s approx  . TONSILLECTOMY  80 (age 10)  . TRANSTHORACIC ECHOCARDIOGRAM  11-17-2016   dr Domenic Polite   mild  LVH,  ef 55-60%, LV wall motion & diastolic function indeterminant assessment,  images were inadequate/  mild LAE/  trivial Tr   Social History   Socioeconomic History  . Marital status: Married    Spouse name: Not on file  . Number of children: Not on file  . Years of education: Not on file  . Highest education level: Not on file  Occupational History  . Occupation: Drives Chief Operating Officer: [employer]  Tobacco Use  . Smoking status: Never Smoker  .  Smokeless tobacco: Never Used  Substance and Sexual Activity  . Alcohol use: No    Alcohol/week: 0.0 standard drinks  . Drug use: No  . Sexual activity: Yes    Birth control/protection: None  Other Topics Concern  . Not on file  Social History Narrative   Lives with wife, does not exercise but part of his job is strenuous. Works in a body-shop part-time, too.   2 of his mother's sisters and 1 brother died suddenly (family was told MI but no autopsy).    Social Determinants of Health   Financial Resource Strain:   . Difficulty of Paying Living Expenses: Not on file  Food Insecurity:   . Worried About Charity fundraiser in the Last Year: Not on file  . Ran Out of Food in the Last Year: Not on file  Transportation Needs:   . Lack of Transportation (Medical): Not on file  . Lack of Transportation (Non-Medical): Not on file  Physical Activity:   . Days of Exercise per Week: Not on file  . Minutes of Exercise per Session: Not on file  Stress:   . Feeling of Stress : Not on file  Social Connections:   . Frequency of Communication with Friends and Family: Not on file  . Frequency of Social Gatherings with Friends and Family: Not on file  . Attends Religious Services: Not on file  . Active Member of Clubs or Organizations: Not on file  . Attends Archivist Meetings: Not on file  . Marital Status: Not on file   Outpatient Encounter Medications as of 09/13/2019  Medication Sig  . Ascorbic Acid (VITAMIN C) 1000 MG tablet Take 1,000 mg by mouth daily.  Marland Kitchen aspirin EC 325 MG EC tablet Take 1 tablet (325 mg total) by mouth daily. (Patient taking differently: Take 325 mg by mouth every morning. )  . colchicine 0.6 MG tablet Take 0.6 mg by mouth daily.   Marland Kitchen escitalopram (LEXAPRO) 10 MG tablet Take 10 mg by mouth at bedtime.   Marland Kitchen lisinopril (PRINIVIL,ZESTRIL) 10 MG tablet Take 5 mg by mouth every morning.   . lovastatin (MEVACOR) 20 MG tablet TAKE ONE TABLET BY MOUTH AT BEDTIME, STOP  TAKING ATORVASTATIN  . metFORMIN (GLUCOPHAGE) 500 MG tablet Take 1 tablet by mouth twice daily  . metoprolol tartrate (LOPRESSOR) 25 MG tablet Take 0.5 tablets (12.5 mg total) by mouth 2 (two) times daily.  . Multiple Vitamin (MULTIVITAMIN WITH MINERALS) TABS tablet Take 1 tablet by mouth daily.  . Omega-3 Fatty Acids (FISH OIL) 1000 MG CPDR Take 1 capsule by mouth 2 (two) times a day.   Marland Kitchen omeprazole (PRILOSEC) 20 MG capsule Take 20 mg by mouth every morning.   . pyridoxine (B-6) 200 MG tablet Take 200 mg by mouth daily.  . [DISCONTINUED] MAGNESIUM CITRATE PO Take 1 Bottle by mouth once.   No facility-administered encounter medications on file as of 09/13/2019.   ALLERGIES:  No Known Allergies VACCINATION STATUS: Immunization History  Administered Date(s) Administered  . Influenza Split 02/10/2014  . Influenza,inj,Quad PF,6+ Mos 04/20/2017    Diabetes He presents for his follow-up diabetic visit. He has type 2 diabetes mellitus. His disease course has been stable. There are no hypoglycemic associated symptoms. Pertinent negatives for hypoglycemia include no confusion, headaches, pallor or seizures. Pertinent negatives for diabetes include no chest pain, no fatigue, no polydipsia, no polyphagia, no polyuria and no weakness. There are no hypoglycemic complications. Symptoms are stable. Diabetic complications include heart disease and nephropathy. Risk factors for coronary artery disease include diabetes mellitus, dyslipidemia, hypertension, male sex and sedentary lifestyle. He is compliant with treatment most of the time. His weight is decreasing steadily. He is following a generally unhealthy diet. When asked about meal planning, he reported none. He never participates in exercise. An ACE inhibitor/angiotensin II receptor blocker is being taken.  Hyperlipidemia This is a chronic problem. The current episode started more than 1 year ago. Exacerbating diseases include diabetes. Pertinent negatives  include no chest pain, myalgias or shortness of breath. Current antihyperlipidemic treatment includes statins. Risk factors for coronary artery disease include diabetes mellitus, dyslipidemia, hypertension, male sex and a sedentary lifestyle.  Hypertension This is a chronic problem. The current episode started more than 1 year ago. The problem is controlled. Pertinent negatives include no chest pain, headaches, neck pain, palpitations or shortness of breath. Risk factors for coronary artery disease include dyslipidemia, diabetes mellitus, male gender and sedentary lifestyle. Past treatments include beta blockers and ACE inhibitors.     Review of systems  Constitutional: + Minimally fluctuating body weight,  current  Body mass index is 29.24 kg/m. , no fatigue, no subjective hyperthermia, no subjective hypothermia Eyes: no blurry vision, no xerophthalmia ENT: no sore throat, no nodules palpated in throat, no dysphagia/odynophagia, no hoarseness Cardiovascular: no Chest Pain, no Shortness of Breath, no palpitations, no leg swelling Respiratory: no cough, no shortness of breath Gastrointestinal: no Nausea/Vomiting/Diarhhea Musculoskeletal: no muscle/joint aches Skin: no rashes, no hyperemia Neurological: no tremors, no numbness, no tingling, no dizziness Psychiatric: no depression, no anxiety  Objective:    BP 117/77   Pulse (!) 59   Ht 6' (1.829 m)   Wt 215 lb 9.6 oz (97.8 kg)   BMI 29.24 kg/m   Wt Readings from Last 3 Encounters:  09/13/19 215 lb 9.6 oz (97.8 kg)  09/07/19 219 lb (99.3 kg)  02/21/19 227 lb (103 kg)     Physical Exam- Limited  Constitutional:  Body mass index is 29.24 kg/m. , not in acute distress, normal state of mind Eyes:  EOMI, no exophthalmos Neck: Supple Thyroid: No gross goiter Respiratory: Adequate breathing efforts Musculoskeletal: no gross deformities, strength intact in all four extremities, no gross restriction of joint movements Skin:  no rashes,  no hyperemia Neurological: no tremor with outstretched hands,    CMP     Component Value Date/Time   NA 142 09/04/2019 0916   K 4.5 09/04/2019 0916   CL 106 09/04/2019 0916   CO2 29 09/04/2019 0916   GLUCOSE 140 (H) 09/04/2019 0916   BUN 16 09/04/2019 0916   CREATININE 1.33 (H) 09/04/2019 0916   CALCIUM 9.3 09/04/2019 0916   PROT 6.3 09/04/2019 0916   ALBUMIN 3.8 04/04/2017 1134   AST 22 09/04/2019 0916   ALT 20 09/04/2019 0916   ALKPHOS 102 04/04/2017 1134   BILITOT 1.1 09/04/2019 0916   GFRNONAA 55 (L) 09/04/2019 0916   GFRAA 64  09/04/2019 0916     Diabetic Labs (most recent): Lab Results  Component Value Date   HGBA1C 6.6 (A) 09/13/2019   HGBA1C 6.6 (H) 03/06/2019   HGBA1C 6.3 (H) 10/27/2018     Lipid Panel ( most recent) Lipid Panel     Component Value Date/Time   CHOL 152 09/04/2019 0916   TRIG 172 (H) 09/04/2019 0916   HDL 38 (L) 09/04/2019 0916   CHOLHDL 4.0 09/04/2019 0916   VLDL 21 07/02/2016 0853   LDLCALC 87 09/04/2019 0916     Assessment & Plan:   1. Type 2 diabetes mellitus with other circulatory complications , and acute on chronic renal insufficiency   His diabetes is  complicated by coronary artery disease status post coronary artery bypass graft , acute on chronic renal insufficiency and patient remains at a high risk for more acute and chronic complications of diabetes which include CAD, CVA, CKD, retinopathy, and neuropathy. These are all discussed in detail with the patient.  -He denies any hypoglycemia.  His point-of-care A1c is still good at 6.6%, overall improving from 8.3%.    - Recent labs reviewed.  - I have re-counseled the patient on diet management  by adopting a carbohydrate restricted / protein rich  Diet.  - he  admits there is a room for improvement in his diet and drink choices. -  Suggestion is made for him to avoid simple carbohydrates  from his diet including Cakes, Sweet Desserts / Pastries, Ice Cream, Soda (diet and  regular), Sweet Tea, Candies, Chips, Cookies, Sweet Pastries,  Store Bought Juices, Alcohol in Excess of  1-2 drinks a day, Artificial Sweeteners, Coffee Creamer, and "Sugar-free" Products. This will help patient to have stable blood glucose profile and potentially avoid unintended weight gain.  - Patient is advised to stick to a routine mealtimes to eat 3 meals  a day and avoid unnecessary snacks ( to snack only to correct hypoglycemia).  - I have approached patient with the following individualized plan to manage diabetes and patient agrees.  -Given his stable A1c of 6.6%, he will not need insulin treatment at this time.   -He is willing to continue on modified lifestyle including consumption of less sweetened beverages and processed carbohydrates.    -He is advised to continue Metformin  500 mg p.o. twice daily--after breakfast and after supper.  He will be considered for low-dose glipizide if he loses control of diabetes during his subsequent visits, with A1c above 8%.  -He is advised to maintain adequate hydration and will repeat his renal function before his next visit. -He is approached to start monitoring blood glucose at least one time daily before breakfast and report if blood glucose readings are above 200 mg/dL 3 times a week.    2) BP/HTN: His blood pressure is controlled to target.   He is advised to continue his current blood pressure medications including metoprolol and lisinopril.     3) Lipids/HPL: He has uncontrolled lipid panel with stable LDL at 87.  He is advised to continue lovastatin 20 mg p.o. nightly. He has hypertriglyceridemia, advised to continue his fish oil 2 g p.o. twice daily.    4) weight management: His current BMI is 29 -He is a candidate for modest weight loss.  Detailed carbohydrates and exercise regimen was discussed with him.  5) Chronic Care/Health Maintenance:  -Patient is on  Statin medications and encouraged to continue to follow up with  Ophthalmology, Podiatrist at least yearly or according to  recommendations, and advised to  stay away from smoking. I have recommended yearly flu vaccine and pneumonia vaccination at least every 5 years; moderate intensity exercise for up to 150 minutes weekly; and  sleep for at least 7 hours a day.  - I advised patient to maintain close follow up with Rory Percy, MD for primary care needs. - Time spent on this patient care encounter:  35 min, of which > 50% was spent in  counseling and the rest reviewing his blood glucose logs , discussing his hypoglycemia and hyperglycemia episodes, reviewing his current and  previous labs / studies  ( including abstraction from other facilities) and medications  doses and developing a  long term treatment plan and documenting his care.   Please refer to Patient Instructions for Blood Glucose Monitoring and Insulin/Medications Dosing Guide"  in media tab for additional information. Please  also refer to " Patient Self Inventory" in the Media  tab for reviewed elements of pertinent patient history.  [name] [name] participated in the discussions, expressed understanding, and voiced agreement with the above plans.  All questions were answered to his satisfaction. he is encouraged to contact clinic should he have any questions or concerns prior to his return visit.   Follow up plan: -Return in about 4 months (around 01/13/2020) for Follow up with Pre-visit Labs, Next Visit A1c in Office.    Glade Lloyd, MD Phone: [phone]  Fax: [phone]   -  This note was partially dictated with voice recognition software. Similar sounding words can be transcribed inadequately or may not  be corrected upon review.  09/13/2019, 5:53 PM

## 2019-09-13 NOTE — Patient Instructions (Signed)
                                     Advice for Weight Management  -For most of us the best way to lose weight is by diet management. Generally speaking, diet management means consuming less calories intentionally which over time brings about progressive weight loss.  This can be achieved more effectively by restricting carbohydrate consumption to the minimum possible.  So, it is critically important to know your numbers: how much calorie you are consuming and how much calorie you need. More importantly, our carbohydrates sources should be unprocessed or minimally processed complex starch food items.   Sometimes, it is important to balance nutrition by increasing protein intake (animal or plant source), fruits, and vegetables.  -Sticking to a routine mealtime to eat 3 meals a day and avoiding unnecessary snacks is shown to have a big role in weight control. Under normal circumstances, the only time we lose real weight is when we are hungry, so allow hunger to take place- hunger means no food between meal times, only water.  It is not advisable to starve.   -It is better to avoid simple carbohydrates including: Cakes, Sweet Desserts, Ice Cream, Soda (diet and regular), Sweet Tea, Candies, Chips, Cookies, Store Bought Juices, Alcohol in Excess of  1-2 drinks a day, Artificial Sweeteners, Doughnuts, Coffee Creamers, "Sugar-free" Products, etc, etc.  This is not a complete list.....    -Consulting with certified diabetes educators is proven to provide you with the most accurate and current information on diet.  Also, you may be  interested in discussing diet options/exchanges , we can schedule a visit with [name] [name], RDN, CDE for individualized nutrition education.  -Exercise: If you are able: 30 -60 minutes a day ,4 days a week, or 150 minutes a week.  The longer the better.  Combine stretch, strength, and aerobic activities.  If you were told in the past that you  have high risk for cardiovascular diseases, you may seek evaluation by your heart doctor prior to initiating moderate to intense exercise programs.                                  Additional Care Considerations for Diabetes   -Diabetes  is a chronic disease.  The most important care consideration is regular follow-up with your diabetes care provider with the goal being avoiding or delaying its complications and to take advantage of advances in medications and technology.    -Type 2 diabetes is known to coexist with other important comorbidities such as high blood pressure and high cholesterol.  It is critical to control not only the diabetes but also the high blood pressure and high cholesterol to minimize and delay the risk of complications including coronary artery disease, stroke, amputations, blindness, etc.    - Studies showed that people with diabetes will benefit from a class of medications known as ACE inhibitors and statins.  Unless there are specific reasons not to be on these medications, the standard of care is to consider getting one from these groups of medications at an optimal doses.  These medications are generally considered safe and proven to help protect the heart and the kidneys.    - People with diabetes are encouraged to initiate and maintain regular follow-up with eye doctors, foot doctors, dentists ,   and if necessary heart and kidney doctors.     - It is highly recommended that people with diabetes quit smoking or stay away from smoking, and get yearly  flu vaccine and pneumonia vaccine at least every 5 years.  One other important lifestyle recommendation is to ensure adequate sleep - at least 6-7 hours of uninterrupted sleep at night.  -Exercise: If you are able: 30 -60 minutes a day, 4 days a week, or 150 minutes a week.  The longer the better.  Combine stretch, strength, and aerobic activities.  If you were told in the past that you have high risk for cardiovascular  diseases, you may seek evaluation by your heart doctor prior to initiating moderate to intense exercise programs.          

## 2019-09-19 ENCOUNTER — Other Ambulatory Visit: Payer: Self-pay | Admitting: Cardiology

## 2019-09-19 ENCOUNTER — Other Ambulatory Visit: Payer: Self-pay

## 2019-09-19 ENCOUNTER — Telehealth: Payer: Self-pay | Admitting: "Endocrinology

## 2019-09-19 MED ORDER — METFORMIN HCL 500 MG PO TABS: 500.0000 mg | ORAL_TABLET | Freq: Two times a day (BID) | ORAL | 0 refills | Status: DC

## 2019-09-19 MED ORDER — LOVASTATIN 20 MG PO TABS: ORAL_TABLET | ORAL | 1 refills | Status: DC

## 2019-09-19 NOTE — Telephone Encounter (Signed)
Pt needs refill on metFORMIN (GLUCOPHAGE) 500 MG tablet. [facility] [place]

## 2019-09-19 NOTE — Telephone Encounter (Signed)
    1. Which medications need to be refilled? (please list name of each medication and dose if known) lovastatin (MEVACOR) 20 MG tablet    2. Which pharmacy/location (including street and city if local pharmacy) is medication to be sent to?   [facility]  [place] [place]  3. Do they need a 30 day or 90 day supply?   Patient requested to have his pharmacy switched to [facility] [place]

## 2019-09-19 NOTE — Telephone Encounter (Signed)
Rx refill sent.

## 2019-11-06 DIAGNOSIS — Z0000 Encounter for general adult medical examination without abnormal findings: Secondary | ICD-10-CM | POA: Diagnosis not present

## 2019-11-06 DIAGNOSIS — I10 Essential (primary) hypertension: Secondary | ICD-10-CM | POA: Diagnosis not present

## 2019-11-06 DIAGNOSIS — E119 Type 2 diabetes mellitus without complications: Secondary | ICD-10-CM | POA: Diagnosis not present

## 2019-11-06 DIAGNOSIS — D373 Neoplasm of uncertain behavior of appendix: Secondary | ICD-10-CM | POA: Diagnosis not present

## 2019-11-06 DIAGNOSIS — I25119 Atherosclerotic heart disease of native coronary artery with unspecified angina pectoris: Secondary | ICD-10-CM | POA: Diagnosis not present

## 2019-11-06 DIAGNOSIS — E782 Mixed hyperlipidemia: Secondary | ICD-10-CM | POA: Diagnosis not present

## 2019-11-06 DIAGNOSIS — Z6829 Body mass index (BMI) 29.0-29.9, adult: Secondary | ICD-10-CM | POA: Diagnosis not present

## 2019-12-05 LAB — HM DIABETES EYE EXAM

## 2019-12-12 ENCOUNTER — Other Ambulatory Visit: Payer: Self-pay | Admitting: "Endocrinology

## 2020-01-22 ENCOUNTER — Ambulatory Visit: Payer: Medicare PPO | Admitting: "Endocrinology

## 2020-01-23 DIAGNOSIS — E1121 Type 2 diabetes mellitus with diabetic nephropathy: Secondary | ICD-10-CM | POA: Diagnosis not present

## 2020-01-23 DIAGNOSIS — N1831 Chronic kidney disease, stage 3a: Secondary | ICD-10-CM | POA: Diagnosis not present

## 2020-01-24 LAB — COMPLETE METABOLIC PANEL WITH GFR
AG Ratio: 2 (calc) (ref 1.0–2.5)
ALT: 15 U/L (ref 9–46)
AST: 18 U/L (ref 10–35)
Albumin: 4.3 g/dL (ref 3.6–5.1)
Alkaline phosphatase (APISO): 69 U/L (ref 35–144)
BUN/Creatinine Ratio: 12 (calc) (ref 6–22)
BUN: 17 mg/dL (ref 7–25)
CO2: 27 mmol/L (ref 20–32)
Calcium: 9.4 mg/dL (ref 8.6–10.3)
Chloride: 108 mmol/L (ref 98–110)
Creat: 1.41 mg/dL — ABNORMAL HIGH (ref 0.70–1.25)
GFR, Est African American: 60 mL/min/{1.73_m2} (ref >=60)
GFR, Est Non African American: 52 mL/min/{1.73_m2} — ABNORMAL LOW (ref >=60)
Globulin: 2.1 g/dL (calc) (ref 1.9–3.7)
Glucose, Bld: 129 mg/dL (ref 65–139)
Potassium: 4.7 mmol/L (ref 3.5–5.3)
Sodium: 142 mmol/L (ref 135–146)
Total Bilirubin: 1 mg/dL (ref 0.2–1.2)
Total Protein: 6.4 g/dL (ref 6.1–8.1)

## 2020-01-31 ENCOUNTER — Other Ambulatory Visit: Payer: Self-pay

## 2020-01-31 ENCOUNTER — Encounter: Payer: Self-pay | Admitting: "Endocrinology

## 2020-01-31 ENCOUNTER — Ambulatory Visit: Payer: Medicare PPO | Admitting: "Endocrinology

## 2020-01-31 VITALS — BP 138/73 | HR 51 | Ht 72.0 in | Wt 224.6 lb

## 2020-01-31 DIAGNOSIS — E782 Mixed hyperlipidemia: Secondary | ICD-10-CM | POA: Diagnosis not present

## 2020-01-31 DIAGNOSIS — E1121 Type 2 diabetes mellitus with diabetic nephropathy: Secondary | ICD-10-CM | POA: Diagnosis not present

## 2020-01-31 DIAGNOSIS — N1831 Chronic kidney disease, stage 3a: Secondary | ICD-10-CM

## 2020-01-31 DIAGNOSIS — I10 Essential (primary) hypertension: Secondary | ICD-10-CM | POA: Diagnosis not present

## 2020-01-31 LAB — POCT GLYCOSYLATED HEMOGLOBIN (HGB A1C): Hemoglobin A1C: 6.5 % — AB (ref 4.0–5.6)

## 2020-01-31 MED ORDER — METFORMIN HCL 500 MG PO TABS: 500.0000 mg | ORAL_TABLET | Freq: Two times a day (BID) | ORAL | 1 refills | Status: DC

## 2020-01-31 NOTE — Patient Instructions (Signed)

## 2020-01-31 NOTE — Progress Notes (Signed)
01/31/2020        Endocrinology follow-up note     Subjective:    Patient ID: [name] [name], male    DOB: 07/06/1951, PCP Rory Percy, MD   Past Medical History:  Diagnosis Date  . Anxiety   . 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 Arthritis   . Coronary artery disease    NSTEMI [id number]; multivessel CAD s/p post CABG (LIMA-LAD, SVG-D1, SVG-D2, SVG-OM)  . Essential hypertension   . GERD (gastroesophageal reflux disease)   . Gout   . History of kidney stones   . History of peptic ulcer disease   . Hyperlipidemia   . Left ureteral stone   . Nodule of right lung    CT 04-04-2017  right lower lobe multiple nodules  . OSA on CPAP   . Renal cyst, right    CT 04-04-2017  . Renal insufficiency   . Type 2 diabetes mellitus ([place])   . Wears glasses    Past Surgical History:  Procedure Laterality Date  . CARDIOVASCULAR STRESS TEST  11-13-2016   dr Domenic Polite   Intermediate risk nuclear study w/ medium defect of moderate severity in the basal inferoseptal, basal inferior, mid inferoseptal and mid inferior location (findings consistant w/ prior myocardial infarction)/  mild enlarged LV cavity size;  nuclear stress EF 45%  . COLONOSCOPY N/A 04/13/2017   Procedure: COLONOSCOPY;  Surgeon: Aviva Signs, MD;  Location: [facility];  Service: Gastroenterology;  Laterality: N/A;  . CORONARY ARTERY BYPASS GRAFT N/A 04/25/2013   Procedure: CORONARY ARTERY BYPASS GRAFTING (CABG);  Surgeon: [name], MD;  Location: [place];  Service: Open Heart Surgery;  Laterality: N/A;  Coronary artery bypass graft times four, on pump, using left internal mammary artery and right greater saphenous vein via endovein harvest.  . CYSTOSCOPY WITH RETROGRADE PYELOGRAM, URETEROSCOPY AND STENT PLACEMENT Left 04/08/2017   Procedure: CYSTOSCOPY WITH RETROGRADE PYELOGRAM, URETEROSCOPY,STENT PLACEMENT;  Surgeon: Lucas Mallow, MD;  Location: [facility];   Service: Urology;  Laterality: Left;  . CYSTOSCOPY/URETEROSCOPY/HOLMIUM LASER/STENT PLACEMENT Left 05/17/2017   Procedure: CYSTOSCOPY / RETROGRADE PYELOGRAM / URETEROSCOPY / HOLMIUM LASER / STONE BASKETRY / STENT EXCHANGE;  Surgeon: Lucas Mallow, MD;  Location: [place];  Service: Urology;  Laterality: Left;  ONLY NEEDS 45 MIN FOR PROCEDURE  . ELBOW BURSA SURGERY Right 03-10-2017     at [facility]   and removal of spur  . INTRAOPERATIVE TRANSESOPHAGEAL ECHOCARDIOGRAM N/A 04/25/2013   Procedure: INTRAOPERATIVE TRANSESOPHAGEAL ECHOCARDIOGRAM;  Surgeon: [name], MD;  Location: [facility] OR;  Service: Open Heart Surgery;  Laterality: N/A;  . KNEE ARTHROSCOPY Left 10/ 2017  approx.    dr Theda Sers  . LAPAROSCOPIC RIGHT HEMI COLECTOMY Right 04/19/2017   Procedure: OPEN RIGHT HEMI COLECTOMY;  Surgeon: Virl Cagey, MD;  Location: [facility];  Service: General;  Laterality: Right;  . LAPAROSCOPY N/A 04/19/2017   Procedure: LAPAROSCOPY DIAGNOSTIC;  Surgeon: Virl Cagey, MD;  Location: [facility];  Service: General;  Laterality: N/A;  patient knows to arrive at 6:15  . LEFT HEART CATHETERIZATION WITH CORONARY ANGIOGRAM N/A 04/20/2013   Procedure: LEFT HEART CATHETERIZATION WITH CORONARY ANGIOGRAM;  Surgeon: [name], MD;  Location[facility];  Service: Cardiovascular;  Laterality: N/A;  . NASAL SEPTUM SURGERY  1990s approx  . TONSILLECTOMY  88 (age 37)  . TRANSTHORACIC ECHOCARDIOGRAM  11-17-2016   dr Domenic Polite   mild  LVH,  ef 55-60%, LV wall motion & diastolic function indeterminant assessment,  images were inadequate/  mild LAE/  trivial Tr   Social History   Socioeconomic History  . Marital status: Married    Spouse name: Not on file  . Number of children: Not on file  . Years of education: Not on file  . Highest education level: Not on file  Occupational History  . Occupation: Drives Chief Operating Officer: [employer]  Tobacco Use  . Smoking status: Never Smoker  .  Smokeless tobacco: Never Used  Vaping Use  . Vaping Use: Never used  Substance and Sexual Activity  . Alcohol use: No    Alcohol/week: 0.0 standard drinks  . Drug use: No  . Sexual activity: Yes    Birth control/protection: None  Other Topics Concern  . Not on file  Social History Narrative   Lives with wife, does not exercise but part of his job is strenuous. Works in a body-shop part-time, too.   2 of his mother's sisters and 1 brother died suddenly (family was told MI but no autopsy).    Social Determinants of Health   Financial Resource Strain:   . Difficulty of Paying Living Expenses:   Food Insecurity:   . Worried About Charity fundraiser in the Last Year:   . Arboriculturist in the Last Year:   Transportation Needs:   . Film/video editor (Medical):   Marland Kitchen Lack of Transportation (Non-Medical):   Physical Activity:   . Days of Exercise per Week:   . Minutes of Exercise per Session:   Stress:   . Feeling of Stress :   Social Connections:   . Frequency of Communication with Friends and Family:   . Frequency of Social Gatherings with Friends and Family:   . Attends Religious Services:   . Active Member of Clubs or Organizations:   . Attends Archivist Meetings:   Marland Kitchen Marital Status:    Outpatient Encounter Medications as of 01/31/2020  Medication Sig  . Ascorbic Acid (VITAMIN C) 1000 MG tablet Take 1,000 mg by mouth daily.  Marland Kitchen aspirin EC 325 MG EC tablet Take 1 tablet (325 mg total) by mouth daily. (Patient taking differently: Take 325 mg by mouth every morning. )  . colchicine 0.6 MG tablet Take 0.6 mg by mouth daily.   Marland Kitchen escitalopram (LEXAPRO) 10 MG tablet Take 10 mg by mouth at bedtime.   Marland Kitchen lisinopril (PRINIVIL,ZESTRIL) 10 MG tablet Take 5 mg by mouth every morning.   . lovastatin (MEVACOR) 20 MG tablet TAKE ONE TABLET BY MOUTH AT BEDTIME, STOP TAKING ATORVASTATIN  . metFORMIN (GLUCOPHAGE) 500 MG tablet Take 1 tablet (500 mg total) by mouth 2 (two) times  daily.  . metoprolol tartrate (LOPRESSOR) 25 MG tablet Take 0.5 tablets (12.5 mg total) by mouth 2 (two) times daily.  . Multiple Vitamin (MULTIVITAMIN WITH MINERALS) TABS tablet Take 1 tablet by mouth daily.  . Omega-3 Fatty Acids (FISH OIL) 1000 MG CPDR Take 1 capsule by mouth 2 (two) times a day.   Marland Kitchen omeprazole (PRILOSEC) 20 MG capsule Take 20 mg by mouth every morning.   . pyridoxine (B-6) 200 MG tablet Take 200 mg by mouth daily.  . [DISCONTINUED] metFORMIN (GLUCOPHAGE) 500 MG tablet TAKE 1 TABLET BY MOUTH TWICE A DAY   No facility-administered encounter medications on file as of 01/31/2020.   ALLERGIES: No Known Allergies VACCINATION STATUS: Immunization History  Administered Date(s)  Administered  . Influenza Split 02/10/2014  . Influenza,inj,Quad PF,6+ Mos 04/20/2017    Diabetes He presents for his follow-up diabetic visit. He has type 2 diabetes mellitus. His disease course has been stable. There are no hypoglycemic associated symptoms. Pertinent negatives for hypoglycemia include no confusion, headaches, pallor or seizures. Pertinent negatives for diabetes include no chest pain, no fatigue, no polydipsia, no polyphagia, no polyuria and no weakness. There are no hypoglycemic complications. Symptoms are stable. Diabetic complications include heart disease and nephropathy. Risk factors for coronary artery disease include diabetes mellitus, dyslipidemia, hypertension, male sex and sedentary lifestyle. He is compliant with treatment most of the time. His weight is fluctuating minimally. He is following a generally unhealthy diet. When asked about meal planning, he reported none. He never participates in exercise. There is no change in his home blood glucose trend. An ACE inhibitor/angiotensin II receptor blocker is being taken.  Hyperlipidemia This is a chronic problem. The current episode started more than 1 year ago. Exacerbating diseases include diabetes. Pertinent negatives include no  chest pain, myalgias or shortness of breath. Current antihyperlipidemic treatment includes statins. Risk factors for coronary artery disease include diabetes mellitus, dyslipidemia, hypertension, male sex and a sedentary lifestyle.  Hypertension This is a chronic problem. The current episode started more than 1 year ago. The problem is controlled. Pertinent negatives include no chest pain, headaches, neck pain, palpitations or shortness of breath. Risk factors for coronary artery disease include dyslipidemia, diabetes mellitus, male gender and sedentary lifestyle. Past treatments include beta blockers and ACE inhibitors.     Review of systems  Constitutional: + Progressive weight gain,   current  Body mass index is 30.46 kg/m. , no fatigue, no subjective hyperthermia, no subjective hypothermia Eyes: no blurry vision, no xerophthalmia ENT: no sore throat, no nodules palpated in throat, no dysphagia/odynophagia, no hoarseness Cardiovascular: no Chest Pain, no Shortness of Breath, no palpitations, no leg swelling Respiratory: no cough, no shortness of breath Gastrointestinal: no Nausea/Vomiting/Diarhhea Musculoskeletal: no muscle/joint aches Skin: no rashes, no hyperemia Neurological: no tremors, no numbness, no tingling, no dizziness Psychiatric: no depression, no anxiety  Objective:    BP 138/73   Pulse (!) 51   Ht 6' (1.829 m)   Wt 224 lb 9.6 oz (101.9 kg)   BMI 30.46 kg/m   Wt Readings from Last 3 Encounters:  01/31/20 224 lb 9.6 oz (101.9 kg)  09/13/19 215 lb 9.6 oz (97.8 kg)  09/07/19 219 lb (99.3 kg)      Physical Exam- Limited  Constitutional:  Body mass index is 30.46 kg/m. , not in acute distress, normal state of mind Eyes:  EOMI, no exophthalmos Neck: Supple Thyroid: No gross goiter Respiratory: Adequate breathing efforts Musculoskeletal: no gross deformities, strength intact in all four extremities, no gross restriction of joint movements Skin:  no rashes, no  hyperemia Neurological: no tremor with outstretched hands   CMP     Component Value Date/Time   NA 142 01/23/2020 1002   K 4.7 01/23/2020 1002   CL 108 01/23/2020 1002   CO2 27 01/23/2020 1002   GLUCOSE 129 01/23/2020 1002   BUN 17 01/23/2020 1002   CREATININE 1.41 (H) 01/23/2020 1002   CALCIUM 9.4 01/23/2020 1002   PROT 6.4 01/23/2020 1002   ALBUMIN 3.8 04/04/2017 1134   AST 18 01/23/2020 1002   ALT 15 01/23/2020 1002   ALKPHOS 102 04/04/2017 1134   BILITOT 1.0 01/23/2020 1002   GFRNONAA 52 (L) 01/23/2020 1002   GFRAA  60 01/23/2020 1002     Diabetic Labs (most recent): Lab Results  Component Value Date   HGBA1C 6.5 (A) 01/31/2020   HGBA1C 6.6 (A) 09/13/2019   HGBA1C 6.6 (H) 03/06/2019     Lipid Panel ( most recent) Lipid Panel     Component Value Date/Time   CHOL 152 09/04/2019 0916   TRIG 172 (H) 09/04/2019 0916   HDL 38 (L) 09/04/2019 0916   CHOLHDL 4.0 09/04/2019 0916   VLDL 21 07/02/2016 0853   LDLCALC 87 09/04/2019 0916     Assessment & Plan:   1. Type 2 diabetes mellitus with other circulatory complications , and acute on chronic renal insufficiency   His diabetes is  complicated by coronary artery disease status post coronary artery bypass graft , acute on chronic renal insufficiency and patient remains at a high risk for more acute and chronic complications of diabetes which include CAD, CVA, CKD, retinopathy, and neuropathy. These are all discussed in detail with the patient.  -He denies any hypoglycemia.  His point-of-care A1c 6.5% today, progressively improving from 8.3%.     - Recent labs reviewed.  - I have re-counseled the patient on diet management  by adopting a carbohydrate restricted / protein rich  Diet.   - he  admits there is a room for improvement in his diet and drink choices. -  Suggestion is made for him to avoid simple carbohydrates  from his diet including Cakes, Sweet Desserts / Pastries, Ice Cream, Soda (diet and regular),  Sweet Tea, Candies, Chips, Cookies, Sweet Pastries,  Store Bought Juices, Alcohol in Excess of  1-2 drinks a day, Artificial Sweeteners, Coffee Creamer, and "Sugar-free" Products. This will help patient to have stable blood glucose profile and potentially avoid unintended weight gain.  - Patient is advised to stick to a routine mealtimes to eat 3 meals  a day and avoid unnecessary snacks ( to snack only to correct hypoglycemia).  - I have approached patient with the following individualized plan to manage diabetes and patient agrees.  -Given his stable A1c of 6.5%, he will not need insulin treatment at this time.  He is willing to engage in lifestyle modification including less consumption of sweetened beverages, and processed carbohydrates.   -He is advised to continue Metformin  500 mg p.o. twice daily--after breakfast and after supper.  He will be considered for low-dose glipizide if he loses control of diabetes during his subsequent visits, with A1c above 8%.  -He is advised to maintain adequate hydration and will repeat his renal function before his next visit. -He is approached to start monitoring blood glucose at least one time daily before breakfast and report if blood glucose readings are above 200 mg/dL 3 times a week.    2) BP/HTN: His blood pressure is controlled to target.   He is advised to continue his current blood pressure medications including metoprolol and lisinopril.     3) Lipids/HPL: He has uncontrolled lipid panel with stable LDL at 87.  He is advised to continue lovastatin 20 mg p.o. nightly.   He has hypertriglyceridemia, advised to continue his fish oil 2 g p.o. twice daily.    4) weight management: His current BMI is 30.4-a candidate for modest weight loss.   Detailed carbohydrates and exercise regimen was discussed with him.  5) Chronic Care/Health Maintenance:  -Patient is on  Statin medications and encouraged to continue to follow up with Ophthalmology,  Podiatrist at least yearly or according to recommendations, and  advised to  stay away from smoking. I have recommended yearly flu vaccine and pneumonia vaccination at least every 5 years; moderate intensity exercise for up to 150 minutes weekly; and  sleep for at least 7 hours a day.  - I advised patient to maintain close follow up with Rory Percy, MD for primary care needs.  - Time spent on this patient care encounter:  35 min, of which > 50% was spent in  counseling and the rest reviewing his blood glucose logs , discussing his hypoglycemia and hyperglycemia episodes, reviewing his current and  previous labs / studies  ( including abstraction from other facilities) and medications  doses and developing a  long term treatment plan and documenting his care.   Please refer to Patient Instructions for Blood Glucose Monitoring and Insulin/Medications Dosing Guide"  in media tab for additional information. Please  also refer to " Patient Self Inventory" in the Media  tab for reviewed elements of pertinent patient history.  [name] [name] participated in the discussions, expressed understanding, and voiced agreement with the above plans.  All questions were answered to his satisfaction. he is encouraged to contact clinic should he have any questions or concerns prior to his return visit.   Follow up plan: -Return in about 6 months (around 08/02/2020) for F/U with Pre-visit Labs, NV A1c in Office, NV Office Urine MA.    Glade Lloyd, MD Phone: [phone]  Fax: [phone]   -  This note was partially dictated with voice recognition software. Similar sounding words can be transcribed inadequately or may not  be corrected upon review.  01/31/2020, 9:03 AM

## 2020-02-22 DIAGNOSIS — E1140 Type 2 diabetes mellitus with diabetic neuropathy, unspecified: Secondary | ICD-10-CM | POA: Diagnosis not present

## 2020-02-22 DIAGNOSIS — L03115 Cellulitis of right lower limb: Secondary | ICD-10-CM | POA: Diagnosis not present

## 2020-02-22 DIAGNOSIS — Z6829 Body mass index (BMI) 29.0-29.9, adult: Secondary | ICD-10-CM | POA: Diagnosis not present

## 2020-02-28 ENCOUNTER — Other Ambulatory Visit: Payer: Self-pay

## 2020-02-28 ENCOUNTER — Other Ambulatory Visit: Payer: Self-pay | Admitting: *Deleted

## 2020-02-28 DIAGNOSIS — N1831 Chronic kidney disease, stage 3a: Secondary | ICD-10-CM

## 2020-02-28 MED ORDER — LOVASTATIN 20 MG PO TABS: ORAL_TABLET | ORAL | 0 refills | Status: DC

## 2020-02-28 MED ORDER — METFORMIN HCL 500 MG PO TABS: 500.0000 mg | ORAL_TABLET | Freq: Two times a day (BID) | ORAL | 0 refills | Status: DC

## 2020-03-01 ENCOUNTER — Other Ambulatory Visit: Payer: Self-pay | Admitting: *Deleted

## 2020-03-01 MED ORDER — LOVASTATIN 20 MG PO TABS: ORAL_TABLET | ORAL | 0 refills | Status: DC

## 2020-03-20 DIAGNOSIS — Z20828 Contact with and (suspected) exposure to other viral communicable diseases: Secondary | ICD-10-CM | POA: Diagnosis not present

## 2020-03-27 ENCOUNTER — Telehealth: Payer: Self-pay

## 2020-03-27 DIAGNOSIS — Z951 Presence of aortocoronary bypass graft: Secondary | ICD-10-CM | POA: Diagnosis not present

## 2020-03-27 DIAGNOSIS — K219 Gastro-esophageal reflux disease without esophagitis: Secondary | ICD-10-CM | POA: Diagnosis not present

## 2020-03-27 DIAGNOSIS — L97512 Non-pressure chronic ulcer of other part of right foot with fat layer exposed: Secondary | ICD-10-CM | POA: Diagnosis not present

## 2020-03-27 DIAGNOSIS — I10 Essential (primary) hypertension: Secondary | ICD-10-CM | POA: Diagnosis not present

## 2020-03-27 DIAGNOSIS — E08621 Diabetes mellitus due to underlying condition with foot ulcer: Secondary | ICD-10-CM | POA: Diagnosis not present

## 2020-03-27 DIAGNOSIS — E11621 Type 2 diabetes mellitus with foot ulcer: Secondary | ICD-10-CM | POA: Diagnosis not present

## 2020-03-27 DIAGNOSIS — E785 Hyperlipidemia, unspecified: Secondary | ICD-10-CM | POA: Diagnosis not present

## 2020-03-27 DIAGNOSIS — M19079 Primary osteoarthritis, unspecified ankle and foot: Secondary | ICD-10-CM | POA: Diagnosis not present

## 2020-03-27 DIAGNOSIS — I252 Old myocardial infarction: Secondary | ICD-10-CM | POA: Diagnosis not present

## 2020-03-27 DIAGNOSIS — Z7984 Long term (current) use of oral hypoglycemic drugs: Secondary | ICD-10-CM | POA: Diagnosis not present

## 2020-03-27 DIAGNOSIS — E1140 Type 2 diabetes mellitus with diabetic neuropathy, unspecified: Secondary | ICD-10-CM | POA: Diagnosis not present

## 2020-03-27 NOTE — Telephone Encounter (Signed)
Opened in error

## 2020-04-02 ENCOUNTER — Encounter: Payer: Self-pay | Admitting: "Endocrinology

## 2020-04-02 ENCOUNTER — Other Ambulatory Visit: Payer: Self-pay

## 2020-04-02 ENCOUNTER — Ambulatory Visit (INDEPENDENT_AMBULATORY_CARE_PROVIDER_SITE_OTHER): Payer: Medicare HMO | Admitting: "Endocrinology

## 2020-04-02 VITALS — BP 120/63 | HR 60 | Ht 72.0 in | Wt 220.2 lb

## 2020-04-02 DIAGNOSIS — E119 Type 2 diabetes mellitus without complications: Secondary | ICD-10-CM | POA: Diagnosis not present

## 2020-04-02 DIAGNOSIS — E1121 Type 2 diabetes mellitus with diabetic nephropathy: Secondary | ICD-10-CM | POA: Diagnosis not present

## 2020-04-02 DIAGNOSIS — E1122 Type 2 diabetes mellitus with diabetic chronic kidney disease: Secondary | ICD-10-CM

## 2020-04-02 DIAGNOSIS — N1831 Chronic kidney disease, stage 3a: Secondary | ICD-10-CM | POA: Diagnosis not present

## 2020-04-02 NOTE — Progress Notes (Signed)
04/02/2020        Endocrinology follow-up note     Subjective:    Patient ID: [name] [name], male    DOB: 02-08-1951, PCP Rory Percy, MD   Past Medical History:  Diagnosis Date  . Anxiety   . 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 Arthritis   . Coronary artery disease    NSTEMI 161096; multivessel CAD s/p post CABG (LIMA-LAD, SVG-D1, SVG-D2, SVG-OM)  . Essential hypertension   . GERD (gastroesophageal reflux disease)   . Gout   . History of kidney stones   . History of peptic ulcer disease   . Hyperlipidemia   . Left ureteral stone   . Nodule of right lung    CT 04-04-2017  right lower lobe multiple nodules  . OSA on CPAP   . Renal cyst, right    CT 04-04-2017  . Renal insufficiency   . Type 2 diabetes mellitus ([place])   . Wears glasses    Past Surgical History:  Procedure Laterality Date  . CARDIOVASCULAR STRESS TEST  11-13-2016   dr Domenic Polite   Intermediate risk nuclear study w/ medium defect of moderate severity in the basal inferoseptal, basal inferior, mid inferoseptal and mid inferior location (findings consistant w/ prior myocardial infarction)/  mild enlarged LV cavity size;  nuclear stress EF 45%  . COLONOSCOPY N/A 04/13/2017   Procedure: COLONOSCOPY;  Surgeon: Aviva Signs, MD;  Location: [facility];  Service: Gastroenterology;  Laterality: N/A;  . CORONARY ARTERY BYPASS GRAFT N/A 04/25/2013   Procedure: CORONARY ARTERY BYPASS GRAFTING (CABG);  Surgeon: [name], MD;  Location: [facility];  Service: Open Heart Surgery;  Laterality: N/A;  Coronary artery bypass graft times four, on pump, using left internal mammary artery and right greater saphenous vein via endovein harvest.  . CYSTOSCOPY WITH RETROGRADE PYELOGRAM, URETEROSCOPY AND STENT PLACEMENT Left 04/08/2017   Procedure: CYSTOSCOPY WITH RETROGRADE PYELOGRAM, URETEROSCOPY,STENT PLACEMENT;  Surgeon: Lucas Mallow, MD;  Location: [facility];   Service: Urology;  Laterality: Left;  . CYSTOSCOPY/URETEROSCOPY/HOLMIUM LASER/STENT PLACEMENT Left 05/17/2017   Procedure: CYSTOSCOPY / RETROGRADE PYELOGRAM / URETEROSCOPY / HOLMIUM LASER / STONE BASKETRY / STENT EXCHANGE;  Surgeon: Lucas Mallow, MD;  Location: [facility];  Service: Urology;  Laterality: Left;  ONLY NEEDS 45 MIN FOR PROCEDURE  . ELBOW BURSA SURGERY Right 03-10-2017     at [facility]   and removal of spur  . INTRAOPERATIVE TRANSESOPHAGEAL ECHOCARDIOGRAM N/A 04/25/2013   Procedure: INTRAOPERATIVE TRANSESOPHAGEAL ECHOCARDIOGRAM;  Surgeon: [name], MD;  Location: [facility] OR;  Service: Open Heart Surgery;  Laterality: N/A;  . KNEE ARTHROSCOPY Left 10/ 2017  approx.    dr Theda Sers  . LAPAROSCOPIC RIGHT HEMI COLECTOMY Right 04/19/2017   Procedure: OPEN RIGHT HEMI COLECTOMY;  Surgeon: Virl Cagey, MD;  Location: [facility];  Service: General;  Laterality: Right;  . LAPAROSCOPY N/A 04/19/2017   Procedure: LAPAROSCOPY DIAGNOSTIC;  Surgeon: Virl Cagey, MD;  Location: [facility];  Service: General;  Laterality: N/A;  patient knows to arrive at 6:15  . LEFT HEART CATHETERIZATION WITH CORONARY ANGIOGRAM N/A 04/20/2013   Procedure: LEFT HEART CATHETERIZATION WITH CORONARY ANGIOGRAM;  Surgeon: [name], MD;  Location: [facility] CATH LAB;  Service: Cardiovascular;  Laterality: N/A;  . NASAL SEPTUM SURGERY  1990s approx  . TONSILLECTOMY  74 (age 53)  . TRANSTHORACIC ECHOCARDIOGRAM  11-17-2016   dr Domenic Polite   mild  LVH,  ef 55-60%, LV wall motion & diastolic function indeterminant assessment,  images were inadequate/  mild LAE/  trivial Tr   Social History   Socioeconomic History  . Marital status: Married    Spouse name: Not on file  . Number of children: Not on file  . Years of education: Not on file  . Highest education level: Not on file  Occupational History  . Occupation: Drives Chief Operating Officer: [employer]  Tobacco Use  . Smoking status: Never Smoker  .  Smokeless tobacco: Never Used  Vaping Use  . Vaping Use: Never used  Substance and Sexual Activity  . Alcohol use: No    Alcohol/week: 0.0 standard drinks  . Drug use: No  . Sexual activity: Yes    Birth control/protection: None  Other Topics Concern  . Not on file  Social History Narrative   Lives with wife, does not exercise but part of his job is strenuous. Works in a body-shop part-time, too.   2 of his mother's sisters and 1 brother died suddenly (family was told MI but no autopsy).    Social Determinants of Health   Financial Resource Strain:   . Difficulty of Paying Living Expenses: Not on file  Food Insecurity:   . Worried About Charity fundraiser in the Last Year: Not on file  . Ran Out of Food in the Last Year: Not on file  Transportation Needs:   . Lack of Transportation (Medical): Not on file  . Lack of Transportation (Non-Medical): Not on file  Physical Activity:   . Days of Exercise per Week: Not on file  . Minutes of Exercise per Session: Not on file  Stress:   . Feeling of Stress : Not on file  Social Connections:   . Frequency of Communication with Friends and Family: Not on file  . Frequency of Social Gatherings with Friends and Family: Not on file  . Attends Religious Services: Not on file  . Active Member of Clubs or Organizations: Not on file  . Attends Archivist Meetings: Not on file  . Marital Status: Not on file   Outpatient Encounter Medications as of 04/02/2020  Medication Sig  . Ascorbic Acid (VITAMIN C) 1000 MG tablet Take 1,000 mg by mouth daily.  Marland Kitchen aspirin EC 325 MG EC tablet Take 1 tablet (325 mg total) by mouth daily. (Patient taking differently: Take 325 mg by mouth every morning. )  . colchicine 0.6 MG tablet Take 0.6 mg by mouth daily.   Marland Kitchen escitalopram (LEXAPRO) 10 MG tablet Take 10 mg by mouth at bedtime.   Marland Kitchen lisinopril (PRINIVIL,ZESTRIL) 10 MG tablet Take 5 mg by mouth every morning.   . lovastatin (MEVACOR) 20 MG tablet  TAKE ONE TABLET BY MOUTH AT BEDTIME, STOP TAKING ATORVASTATIN  . metFORMIN (GLUCOPHAGE) 500 MG tablet Take 1 tablet (500 mg total) by mouth 2 (two) times daily.  . metoprolol tartrate (LOPRESSOR) 25 MG tablet Take 0.5 tablets (12.5 mg total) by mouth 2 (two) times daily.  . Multiple Vitamin (MULTIVITAMIN WITH MINERALS) TABS tablet Take 1 tablet by mouth daily.  . Omega-3 Fatty Acids (FISH OIL) 1000 MG CPDR Take 1 capsule by mouth 2 (two) times a day.   Marland Kitchen omeprazole (PRILOSEC) 20 MG capsule Take 20 mg by mouth every morning.   . pyridoxine (B-6) 200 MG tablet Take 200 mg by mouth daily.   No facility-administered encounter medications on file as of 04/02/2020.  ALLERGIES: No Known Allergies VACCINATION STATUS: Immunization History  Administered Date(s) Administered  . Influenza Split 02/10/2014  . Influenza,inj,Quad PF,6+ Mos 04/20/2017    Diabetes He presents for his follow-up diabetic visit. He has type 2 diabetes mellitus. His disease course has been stable. There are no hypoglycemic associated symptoms. Pertinent negatives for hypoglycemia include no confusion, headaches, pallor or seizures. Pertinent negatives for diabetes include no chest pain, no fatigue, no polydipsia, no polyphagia, no polyuria and no weakness. There are no hypoglycemic complications. Symptoms are stable. Diabetic complications include heart disease and nephropathy. Risk factors for coronary artery disease include diabetes mellitus, dyslipidemia, hypertension, male sex and sedentary lifestyle. He is compliant with treatment most of the time. His weight is fluctuating minimally. He is following a generally unhealthy diet. When asked about meal planning, he reported none. He never participates in exercise. There is no change in his home blood glucose trend. An ACE inhibitor/angiotensin II receptor blocker is being taken.  Hyperlipidemia This is a chronic problem. The current episode started more than 1 year ago.  Exacerbating diseases include diabetes. Pertinent negatives include no chest pain, myalgias or shortness of breath. Current antihyperlipidemic treatment includes statins. Risk factors for coronary artery disease include diabetes mellitus, dyslipidemia, hypertension, male sex and a sedentary lifestyle.  Hypertension This is a chronic problem. The current episode started more than 1 year ago. The problem is controlled. Pertinent negatives include no chest pain, headaches, neck pain, palpitations or shortness of breath. Risk factors for coronary artery disease include dyslipidemia, diabetes mellitus, male gender and sedentary lifestyle. Past treatments include beta blockers and ACE inhibitors.     Review of systems: Limited as above.  Objective:    BP 120/63   Pulse 60   Ht 6' (1.829 m)   Wt 220 lb 3.2 oz (99.9 kg)   BMI 29.86 kg/m   Wt Readings from Last 3 Encounters:  04/02/20 220 lb 3.2 oz (99.9 kg)  01/31/20 224 lb 9.6 oz (101.9 kg)  09/13/19 215 lb 9.6 oz (97.8 kg)      Physical Exam- Limited  Constitutional:  Body mass index is 29.86 kg/m. , not in acute distress, normal state of mind Eyes:  EOMI, no exophthalmos Neck: Supple Thyroid: No gross goiter Respiratory: Adequate breathing efforts Musculoskeletal: His bilateral feet exam is significant for calluses, pressure ulcer, diminished monofilament test sensation, as well as diminished dorsalis pedis and posterior tibial arterial pulses.    Skin:  no rashes, no hyperemia Neurological: no tremor with outstretched hands   CMP     Component Value Date/Time   NA 142 01/23/2020 1002   K 4.7 01/23/2020 1002   CL 108 01/23/2020 1002   CO2 27 01/23/2020 1002   GLUCOSE 129 01/23/2020 1002   BUN 17 01/23/2020 1002   CREATININE 1.41 (H) 01/23/2020 1002   CALCIUM 9.4 01/23/2020 1002   PROT 6.4 01/23/2020 1002   ALBUMIN 3.8 04/04/2017 1134   AST 18 01/23/2020 1002   ALT 15 01/23/2020 1002   ALKPHOS 102 04/04/2017 1134    BILITOT 1.0 01/23/2020 1002   GFRNONAA 52 (L) 01/23/2020 1002   GFRAA 60 01/23/2020 1002     Diabetic Labs (most recent): Lab Results  Component Value Date   HGBA1C 6.5 (A) 01/31/2020   HGBA1C 6.6 (A) 09/13/2019   HGBA1C 6.6 (H) 03/06/2019     Lipid Panel ( most recent) Lipid Panel     Component Value Date/Time   CHOL 152 09/04/2019 0916   TRIG  172 (H) 09/04/2019 0916   HDL 38 (L) 09/04/2019 0916   CHOLHDL 4.0 09/04/2019 0916   VLDL 21 07/02/2016 0853   LDLCALC 87 09/04/2019 0916     Assessment & Plan:   1. Type 2 diabetes mellitus with other circulatory complications , and acute on chronic renal insufficiency   His diabetes is  complicated by coronary artery disease status post coronary artery bypass graft , acute on chronic renal insufficiency and patient remains at a high risk for more acute and chronic complications of diabetes which include CAD, CVA, CKD, retinopathy, and neuropathy. These are all discussed in detail with the patient.  -He denies any hypoglycemia.  His point-of-care A1c 6.5% today, progressively improving from 8.3%.     - Recent labs reviewed.  - I have re-counseled the patient on diet management  by adopting a carbohydrate restricted / protein rich  Diet.   - he  admits there is a room for improvement in his diet and drink choices. -  Suggestion is made for him to avoid simple carbohydrates  from his diet including Cakes, Sweet Desserts / Pastries, Ice Cream, Soda (diet and regular), Sweet Tea, Candies, Chips, Cookies, Sweet Pastries,  Store Bought Juices, Alcohol in Excess of  1-2 drinks a day, Artificial Sweeteners, Coffee Creamer, and "Sugar-free" Products. This will help patient to have stable blood glucose profile and potentially avoid unintended weight gain.   - Patient is advised to stick to a routine mealtimes to eat 3 meals  a day and avoid unnecessary snacks ( to snack only to correct hypoglycemia).  - I have approached patient with the  following individualized plan to manage diabetes and patient agrees.  -Even his recent stable A1c of 6.5%,  he will not need insulin treatment at this time.  He is willing to engage in lifestyle modification including less consumption of sweetened beverages, and processed carbohydrates.   -He is advised to continue Metformin  500 mg p.o. twice daily--after breakfast and after supper.  He will be considered for low-dose glipizide if he loses control of diabetes during his subsequent visits, with A1c above 8%.  -He is advised to maintain adequate hydration and will repeat his renal function before his next visit. -He is approached to start monitoring blood glucose at least one time daily before breakfast and report if blood glucose readings are above 200 mg/dL 3 times a week.    2) BP/HTN: His blood pressure is controlled to target.   He is advised to continue his current blood pressure medications including metoprolol and lisinopril.     3) Lipids/HPL: He has uncontrolled lipid panel with stable LDL at 87.  He is advised to continue lovastatin 20 mg p.o. nightly.   He has hypertriglyceridemia, advised to continue his fish oil 2 g p.o. twice daily.    4) weight management: His current BMI is 30.4-a candidate for modest weight loss.   Detailed carbohydrates and exercise regimen was discussed with him.  5) Chronic Care/Health Maintenance:  -Patient is on  Statin medications and encouraged to continue to follow up with Ophthalmology, Podiatrist at least yearly or according to recommendations, and advised to  stay away from smoking. I have recommended yearly flu vaccine and pneumonia vaccination at least every 5 years; moderate intensity exercise for up to 150 minutes weekly; and  sleep for at least 7 hours a day.  Musculoskeletal exam: His bilateral feet exam is significant for calluses, pressure ulcer, diminished monofilament test sensation, as well as  diminished dorsalis pedis and posterior  tibial arterial pulses.  This patient will benefit from a pair of diabetic shoes.  - I advised patient to maintain close follow up with Rory Percy, MD for primary care needs.     - Time spent on this patient care encounter:  20 minutes of which 50% was spent in  counseling and the rest reviewing  his current and  previous labs / studies and medications  doses and developing a plan for long term care. [name] [name]  participated in the discussions, expressed understanding, and voiced agreement with the above plans.  All questions were answered to his satisfaction. he is encouraged to contact clinic should he have any questions or concerns prior to his return visit.    Follow up plan: -Return for Keep Reg. Appt. with Pre-visit Labs.    Glade Lloyd, MD Phone: [phone]  Fax: [phone]   -  This note was partially dictated with voice recognition software. Similar sounding words can be transcribed inadequately or may not  be corrected upon review.  04/02/2020, 5:38 PM

## 2020-04-02 NOTE — Patient Instructions (Signed)
Foot Exam was completed today.

## 2020-04-02 NOTE — Progress Notes (Signed)
Pt needs a diabetic foot exam.

## 2020-04-03 ENCOUNTER — Ambulatory Visit: Payer: Medicare HMO | Attending: Physician Assistant | Admitting: Neurology

## 2020-04-03 DIAGNOSIS — Z7984 Long term (current) use of oral hypoglycemic drugs: Secondary | ICD-10-CM | POA: Diagnosis not present

## 2020-04-03 DIAGNOSIS — Z79899 Other long term (current) drug therapy: Secondary | ICD-10-CM | POA: Diagnosis not present

## 2020-04-03 DIAGNOSIS — G4710 Hypersomnia, unspecified: Secondary | ICD-10-CM

## 2020-04-03 DIAGNOSIS — G4733 Obstructive sleep apnea (adult) (pediatric): Secondary | ICD-10-CM | POA: Insufficient documentation

## 2020-04-03 DIAGNOSIS — R0683 Snoring: Secondary | ICD-10-CM | POA: Diagnosis not present

## 2020-04-03 DIAGNOSIS — Z7982 Long term (current) use of aspirin: Secondary | ICD-10-CM | POA: Diagnosis not present

## 2020-04-03 DIAGNOSIS — R5383 Other fatigue: Secondary | ICD-10-CM

## 2020-04-04 ENCOUNTER — Other Ambulatory Visit (HOSPITAL_BASED_OUTPATIENT_CLINIC_OR_DEPARTMENT_OTHER): Payer: Self-pay

## 2020-04-04 DIAGNOSIS — G4710 Hypersomnia, unspecified: Secondary | ICD-10-CM

## 2020-04-04 DIAGNOSIS — R5383 Other fatigue: Secondary | ICD-10-CM

## 2020-04-04 DIAGNOSIS — G4733 Obstructive sleep apnea (adult) (pediatric): Secondary | ICD-10-CM

## 2020-04-04 DIAGNOSIS — R0683 Snoring: Secondary | ICD-10-CM

## 2020-04-08 NOTE — Procedures (Signed)
Rhodhiss A. Merlene Laughter, MD     [email or web address]             NOCTURNAL POLYSOMNOGRAPHY   LOCATION: [facility]   Patient Name: [name] [name], [name] [name] Date: 04/03/2020 Gender: Male D.O.B: 11/03/1950 Age (years): 24 Referring Provider: Hughie Closs PA Height (inches): 72 Interpreting Physician: Phillips Odor MD, ABSM Weight (lbs): 220 RPSGT: Peak, Robert BMI: 30 MRN: [id number] Neck Size: 18.50 CLINICAL INFORMATION Sleep Study Type: NPSG     Indication for sleep study: N/A     Epworth Sleepiness Score: 8     SLEEP STUDY TECHNIQUE As per the AASM Manual for the Scoring of Sleep and Associated Events v2.3 (April 2016) with a hypopnea requiring 4% desaturations.  The channels recorded and monitored were frontal, central and occipital EEG, electrooculogram (EOG), submentalis EMG (chin), nasal and oral airflow, thoracic and abdominal wall motion, anterior tibialis EMG, snore microphone, electrocardiogram, and pulse oximetry.  MEDICATIONS Medications self-administered by patient taken the night of the study : N/A  Current Outpatient Medications:  .  Ascorbic Acid (VITAMIN C) 1000 MG tablet, Take 1,000 mg by mouth daily., Disp: , Rfl:  .  aspirin EC 325 MG EC tablet, Take 1 tablet (325 mg total) by mouth daily. (Patient taking differently: Take 325 mg by mouth every morning. ), Disp: 30 tablet, Rfl: 0 .  colchicine 0.6 MG tablet, Take 0.6 mg by mouth daily. , Disp: , Rfl:  .  escitalopram (LEXAPRO) 10 MG tablet, Take 10 mg by mouth at bedtime. , Disp: , Rfl:  .  lisinopril (PRINIVIL,ZESTRIL) 10 MG tablet, Take 5 mg by mouth every morning. , Disp: , Rfl:  .  lovastatin (MEVACOR) 20 MG tablet, TAKE ONE TABLET BY MOUTH AT BEDTIME, STOP TAKING ATORVASTATIN, Disp: 90 tablet, Rfl: 0 .  metFORMIN (GLUCOPHAGE) 500 MG tablet, Take 1 tablet (500 mg total) by mouth 2 (two) times daily., Disp: 180 tablet, Rfl: 0 .  metoprolol tartrate (LOPRESSOR) 25 MG  tablet, Take 0.5 tablets (12.5 mg total) by mouth 2 (two) times daily., Disp: 90 tablet, Rfl: 1 .  Multiple Vitamin (MULTIVITAMIN WITH MINERALS) TABS tablet, Take 1 tablet by mouth daily., Disp: , Rfl:  .  Omega-3 Fatty Acids (FISH OIL) 1000 MG CPDR, Take 1 capsule by mouth 2 (two) times a day. , Disp: , Rfl:  .  omeprazole (PRILOSEC) 20 MG capsule, Take 20 mg by mouth every morning. , Disp: , Rfl:  .  pyridoxine (B-6) 200 MG tablet, Take 200 mg by mouth daily., Disp: , Rfl:      SLEEP ARCHITECTURE The study was initiated at 9:39:58 PM and ended at 4:57:58 AM.  Sleep onset time was 12.2 minutes and the sleep efficiency was 84.7%%. The total sleep time was 370.8 minutes.  Stage REM latency was 197.5 minutes.  The patient spent 8.0%% of the night in stage N1 sleep, 75.7%% in stage N2 sleep, 0.0%% in stage N3 and 16.3% in REM.  Alpha intrusion was absent.  Supine sleep was 15.80%.  RESPIRATORY PARAMETERS The overall apnea/hypopnea index (AHI) was 15.0 per hour. There were 11 total apneas, including 4 obstructive, 7 central and 0 mixed apneas. There were 82 hypopneas and 0 RERAs.  The AHI during Stage REM sleep was 1.0 per hour.  AHI while supine was 45.0 per hour.  The mean oxygen saturation was 94.0%. The minimum SpO2 during sleep was 79.0%.  loud snoring was noted during this study.  CARDIAC  DATA The 2 lead EKG demonstrated sinus rhythm. The mean heart rate was 50.0 beats per minute. Other EKG findings include: PVCs.  LEG MOVEMENT DATA The total PLMS were 0 with a resulting PLMS index of 0.0. Associated arousal with leg movement index was 0.0.  IMPRESSIONS 1. Moderate obstructive sleep apnea is documented with the study.  AutoPAP 8-14 is recommended.  Delano Metz, MD Diplomate, American Board of Sleep Medicine.   ELECTRONICALLY SIGNED ON:  04/08/2020, 6:25 PM [place] PH: [phone]   FX: [phone] [place]

## 2020-04-10 DIAGNOSIS — M19079 Primary osteoarthritis, unspecified ankle and foot: Secondary | ICD-10-CM | POA: Diagnosis not present

## 2020-04-10 DIAGNOSIS — L97512 Non-pressure chronic ulcer of other part of right foot with fat layer exposed: Secondary | ICD-10-CM | POA: Diagnosis not present

## 2020-04-10 DIAGNOSIS — E119 Type 2 diabetes mellitus without complications: Secondary | ICD-10-CM | POA: Diagnosis not present

## 2020-04-10 DIAGNOSIS — I10 Essential (primary) hypertension: Secondary | ICD-10-CM | POA: Diagnosis not present

## 2020-04-10 DIAGNOSIS — K219 Gastro-esophageal reflux disease without esophagitis: Secondary | ICD-10-CM | POA: Diagnosis not present

## 2020-04-10 DIAGNOSIS — E08621 Diabetes mellitus due to underlying condition with foot ulcer: Secondary | ICD-10-CM | POA: Diagnosis not present

## 2020-04-10 DIAGNOSIS — E785 Hyperlipidemia, unspecified: Secondary | ICD-10-CM | POA: Diagnosis not present

## 2020-04-10 DIAGNOSIS — Z7984 Long term (current) use of oral hypoglycemic drugs: Secondary | ICD-10-CM | POA: Diagnosis not present

## 2020-04-10 DIAGNOSIS — E11621 Type 2 diabetes mellitus with foot ulcer: Secondary | ICD-10-CM | POA: Diagnosis not present

## 2020-04-10 DIAGNOSIS — L97511 Non-pressure chronic ulcer of other part of right foot limited to breakdown of skin: Secondary | ICD-10-CM | POA: Diagnosis not present

## 2020-04-10 DIAGNOSIS — I252 Old myocardial infarction: Secondary | ICD-10-CM | POA: Diagnosis not present

## 2020-04-10 DIAGNOSIS — Z951 Presence of aortocoronary bypass graft: Secondary | ICD-10-CM | POA: Diagnosis not present

## 2020-04-10 DIAGNOSIS — M19071 Primary osteoarthritis, right ankle and foot: Secondary | ICD-10-CM | POA: Diagnosis not present

## 2020-04-10 DIAGNOSIS — L97519 Non-pressure chronic ulcer of other part of right foot with unspecified severity: Secondary | ICD-10-CM | POA: Diagnosis not present

## 2020-04-10 DIAGNOSIS — E1140 Type 2 diabetes mellitus with diabetic neuropathy, unspecified: Secondary | ICD-10-CM | POA: Diagnosis not present

## 2020-04-12 DIAGNOSIS — E11621 Type 2 diabetes mellitus with foot ulcer: Secondary | ICD-10-CM | POA: Diagnosis not present

## 2020-04-12 DIAGNOSIS — L97519 Non-pressure chronic ulcer of other part of right foot with unspecified severity: Secondary | ICD-10-CM | POA: Diagnosis not present

## 2020-04-12 DIAGNOSIS — M24874 Other specific joint derangements of right foot, not elsewhere classified: Secondary | ICD-10-CM | POA: Diagnosis not present

## 2020-04-19 ENCOUNTER — Other Ambulatory Visit: Payer: Self-pay

## 2020-04-19 ENCOUNTER — Ambulatory Visit: Payer: Medicare HMO | Admitting: Podiatry

## 2020-04-19 ENCOUNTER — Encounter: Payer: Self-pay | Admitting: Podiatry

## 2020-04-19 DIAGNOSIS — E1159 Type 2 diabetes mellitus with other circulatory complications: Secondary | ICD-10-CM | POA: Diagnosis not present

## 2020-04-19 DIAGNOSIS — L97512 Non-pressure chronic ulcer of other part of right foot with fat layer exposed: Secondary | ICD-10-CM

## 2020-04-19 NOTE — Progress Notes (Signed)
Subjective:  Patient ID: [name] [name], male    DOB: Mar 11, 1951,  MRN: [id number]  Chief Complaint  Patient presents with  . diabetic foot care    PT stated that his DR thought it would be good if he came here. HE is a Type 2 diabetic and goes to a wound center     69 y.o. male presents for wound care.  Patient presents with complaint of right hallux IPJ ulceration with underlying metatarsophalangeal joint arthritis.  Patient is a type II diabetic with last A1c of 6.7.  He states that this ulcer started after he did a long hike and ended up with a blister.  He is being seen at the wound care currently and being managed primarily.  He just wanted to come here to get a second evaluation/opinion to make sure that there is no underlying deformity that needs to be addressed to help/allow the wound to be healed.  He denies any other acute complaints.  He would like to discuss treatment options he has not seen anyone else prior to seeing me.   Review of Systems: Negative except as noted in the HPI. Denies N/V/F/Ch.  Past Medical History:  Diagnosis Date  . Anxiety   . 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 Arthritis   . Coronary artery disease    NSTEMI 539767; multivessel CAD s/p post CABG (LIMA-LAD, SVG-D1, SVG-D2, SVG-OM)  . Essential hypertension   . GERD (gastroesophageal reflux disease)   . Gout   . History of kidney stones   . History of peptic ulcer disease   . Hyperlipidemia   . Left ureteral stone   . Nodule of right lung    CT 04-04-2017  right lower lobe multiple nodules  . OSA on CPAP   . Renal cyst, right    CT 04-04-2017  . Renal insufficiency   . Type 2 diabetes mellitus ([place])   . Wears glasses     Current Outpatient Medications:  .  Ascorbic Acid (VITAMIN C) 1000 MG tablet, Take 1,000 mg by mouth daily., Disp: , Rfl:  .  aspirin EC 325 MG EC tablet, Take 1 tablet (325 mg total) by mouth daily. (Patient taking differently:  Take 325 mg by mouth every morning. ), Disp: 30 tablet, Rfl: 0 .  colchicine 0.6 MG tablet, Take 0.6 mg by mouth daily. , Disp: , Rfl:  .  escitalopram (LEXAPRO) 10 MG tablet, Take 10 mg by mouth at bedtime. , Disp: , Rfl:  .  lisinopril (PRINIVIL,ZESTRIL) 10 MG tablet, Take 5 mg by mouth every morning. , Disp: , Rfl:  .  lovastatin (MEVACOR) 20 MG tablet, TAKE ONE TABLET BY MOUTH AT BEDTIME, STOP TAKING ATORVASTATIN, Disp: 90 tablet, Rfl: 0 .  metFORMIN (GLUCOPHAGE) 500 MG tablet, Take 1 tablet (500 mg total) by mouth 2 (two) times daily., Disp: 180 tablet, Rfl: 0 .  metoprolol tartrate (LOPRESSOR) 25 MG tablet, Take 0.5 tablets (12.5 mg total) by mouth 2 (two) times daily., Disp: 90 tablet, Rfl: 1 .  Multiple Vitamin (MULTIVITAMIN WITH MINERALS) TABS tablet, Take 1 tablet by mouth daily., Disp: , Rfl:  .  Omega-3 Fatty Acids (FISH OIL) 1000 MG CPDR, Take 1 capsule by mouth 2 (two) times a day. , Disp: , Rfl:  .  omeprazole (PRILOSEC) 20 MG capsule, Take 20 mg by mouth every morning. , Disp: , Rfl:  .  pyridoxine (B-6) 200 MG tablet, Take 200 mg by mouth  daily., Disp: , Rfl:   Social History   Tobacco Use  Smoking Status Never Smoker  Smokeless Tobacco Never Used    No Known Allergies Objective:  There were no vitals filed for this visit. There is no height or weight on file to calculate BMI. Constitutional Well developed. Well nourished.  Vascular Dorsalis pedis pulses palpable bilaterally. Posterior tibial pulses palpable bilaterally. Capillary refill normal to all digits.  No cyanosis or clubbing noted. Pedal hair growth normal.  Neurologic Normal speech. Oriented to person, place, and time. Protective sensation absent  Dermatologic Wound Location: Right hallux IPJ ulceration with fat layer exposed Wound Base: Granular/Healthy Peri-wound: Calloused Exudate: Scant/small amount Serous exudate Wound Measurements: -See below  Orthopedic: No pain to palpation either foot.    Radiographs: None Assessment:   1. Chronic ulcer of great toe of right foot with fat layer exposed ([place])   2. DM type 2 causing vascular disease ([place])    Plan:  Patient was evaluated and treated and all questions answered.  Ulcer right hallux IPJ ulceration with fat layer exposed -Given that patient is wound is primarily being managed by the wound care center I will continue to focus on this wound from the periphery as the wound is being primarily healed.  Ultimately I discussed with the patient that given his tight glucose control he is a ideal surgical candidate for elective procedures which includes fusion and elevation of the distal phalanx.  Ultimately the reason why he is developing this IPJ ulceration has to do with the gait mechanics with fused first MPJ joint leading to excessive pressure to the IPJ joint.  At this time I will allow the wound care center to attempt to heal the wound prior to doing any kind of surgery to correct the alignment.  However if there is no resolve meant of the wound at next 6 weeks or any decreasing the wound size we may need to discuss surgical intervention to help and allow the correction of the underlying deformity and therefore taken the pressure away.  Patient states understanding -Continue local wound care at the wound care center No follow-ups on file.

## 2020-04-24 DIAGNOSIS — E1140 Type 2 diabetes mellitus with diabetic neuropathy, unspecified: Secondary | ICD-10-CM | POA: Diagnosis not present

## 2020-04-24 DIAGNOSIS — Z951 Presence of aortocoronary bypass graft: Secondary | ICD-10-CM | POA: Diagnosis not present

## 2020-04-24 DIAGNOSIS — E08621 Diabetes mellitus due to underlying condition with foot ulcer: Secondary | ICD-10-CM | POA: Diagnosis not present

## 2020-04-24 DIAGNOSIS — I10 Essential (primary) hypertension: Secondary | ICD-10-CM | POA: Diagnosis not present

## 2020-04-24 DIAGNOSIS — K219 Gastro-esophageal reflux disease without esophagitis: Secondary | ICD-10-CM | POA: Diagnosis not present

## 2020-04-24 DIAGNOSIS — E11621 Type 2 diabetes mellitus with foot ulcer: Secondary | ICD-10-CM | POA: Diagnosis not present

## 2020-04-24 DIAGNOSIS — M19079 Primary osteoarthritis, unspecified ankle and foot: Secondary | ICD-10-CM | POA: Diagnosis not present

## 2020-04-24 DIAGNOSIS — E785 Hyperlipidemia, unspecified: Secondary | ICD-10-CM | POA: Diagnosis not present

## 2020-04-24 DIAGNOSIS — L97512 Non-pressure chronic ulcer of other part of right foot with fat layer exposed: Secondary | ICD-10-CM | POA: Diagnosis not present

## 2020-04-24 DIAGNOSIS — Z7984 Long term (current) use of oral hypoglycemic drugs: Secondary | ICD-10-CM | POA: Diagnosis not present

## 2020-04-24 DIAGNOSIS — I252 Old myocardial infarction: Secondary | ICD-10-CM | POA: Diagnosis not present

## 2020-05-06 DIAGNOSIS — I10 Essential (primary) hypertension: Secondary | ICD-10-CM | POA: Diagnosis not present

## 2020-05-06 DIAGNOSIS — E11622 Type 2 diabetes mellitus with other skin ulcer: Secondary | ICD-10-CM | POA: Diagnosis not present

## 2020-05-06 DIAGNOSIS — Z6830 Body mass index (BMI) 30.0-30.9, adult: Secondary | ICD-10-CM | POA: Diagnosis not present

## 2020-05-06 DIAGNOSIS — D373 Neoplasm of uncertain behavior of appendix: Secondary | ICD-10-CM | POA: Diagnosis not present

## 2020-05-06 DIAGNOSIS — L97519 Non-pressure chronic ulcer of other part of right foot with unspecified severity: Secondary | ICD-10-CM | POA: Diagnosis not present

## 2020-05-06 DIAGNOSIS — E11621 Type 2 diabetes mellitus with foot ulcer: Secondary | ICD-10-CM | POA: Diagnosis not present

## 2020-05-06 DIAGNOSIS — E782 Mixed hyperlipidemia: Secondary | ICD-10-CM | POA: Diagnosis not present

## 2020-05-06 DIAGNOSIS — I25119 Atherosclerotic heart disease of native coronary artery with unspecified angina pectoris: Secondary | ICD-10-CM | POA: Diagnosis not present

## 2020-05-06 DIAGNOSIS — E119 Type 2 diabetes mellitus without complications: Secondary | ICD-10-CM | POA: Diagnosis not present

## 2020-05-07 ENCOUNTER — Other Ambulatory Visit: Payer: Self-pay

## 2020-05-07 ENCOUNTER — Ambulatory Visit (INDEPENDENT_AMBULATORY_CARE_PROVIDER_SITE_OTHER): Payer: Medicare HMO | Admitting: General Surgery

## 2020-05-07 ENCOUNTER — Encounter: Payer: Self-pay | Admitting: General Surgery

## 2020-05-07 VITALS — BP 117/55 | HR 56 | Temp 97.4°F | Resp 12 | Ht 74.0 in | Wt 228.0 lb

## 2020-05-07 DIAGNOSIS — D373 Neoplasm of uncertain behavior of appendix: Secondary | ICD-10-CM | POA: Diagnosis not present

## 2020-05-07 NOTE — Progress Notes (Signed)
[facility] Note   HPI:  69 y.o. 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 He is doing well without issues. Hernia not causing issues and not larger.   Review of Systems:  Tolerating diet No abdominal pain or changes All other review of systems: otherwise negative   Vital Signs:  BP (!) 117/55   Pulse (!) 56   Temp (!) 97.4 F (36.3 C) (Other (Comment))   Resp 12   Ht 6\' 2"  (1.88 m)   Wt 228 lb (103.4 kg)   SpO2 96%   BMI 29.27 kg/m    Physical Exam:  Physical Exam Cardiovascular:     Rate and Rhythm: Normal rate.  Pulmonary:     Effort: Pulmonary effort is normal.  Abdominal:     General: There is no distension.     Palpations: Abdomen is soft.     Tenderness: There is no abdominal tenderness.     Hernia: A hernia is present. Hernia is present in the ventral area.     Comments: Right of midline hernia, reducible, soft  Musculoskeletal:     Cervical back: Normal range of motion.       Assessment:  69 y.o. yo Male with a history of a low grade mucinous neoplasm removed in 2018. Doing well. We had discussed 3 years of surveillance and will complete this plan and recommendations are now changing and require less surviellance.   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

## 2020-05-07 NOTE — Patient Instructions (Signed)
CT abdomen and pelvis to follow up for 3 years total of surveillance.  Chest nodules have been stable over 3 years and are considered benign, so no plan for repeat on chest.    Will call with results.  Let us know if hernia starts to cause issues and want to get it repaired.  Ventral Hernia  A ventral hernia is a bulge of tissue from inside the abdomen that pushes through a weak area of the muscles that form the front wall of the abdomen. The tissues inside the abdomen are inside a sac (peritoneum). These tissues include the small intestine, large intestine, and the fatty tissue that covers the intestines (omentum). Sometimes, the bulge that forms a hernia contains intestines. Other hernias contain only fat. Ventral hernias do not go away without surgical treatment. There are several types of ventral hernias. You may have:  A hernia at an incision site from previous abdominal surgery (incisional hernia).  A hernia just above the belly button (epigastric hernia), or at the belly button (umbilical hernia). These types of hernias can develop from heavy lifting or straining.  A hernia that comes and goes (reducible hernia). It may be visible only when you lift or strain. This type of hernia can be pushed back into the abdomen (reduced).  A hernia that traps abdominal tissue inside the hernia (incarcerated hernia). This type of hernia does not reduce.  A hernia that cuts off blood flow to the tissues inside the hernia (strangulated hernia). The tissues can start to die if this happens. This is a very painful bulge that cannot be reduced. A strangulated hernia is a medical emergency. What are the causes? This condition is caused by abdominal tissue putting pressure on an area of weakness in the abdominal muscles. What increases the risk? The following factors may make you more likely to develop this condition:  Being male.  Being 30 or older.  Being overweight or obese.  Having had previous  abdominal surgery, especially if there was an infection after surgery.  Having had an injury to the abdominal wall.  Having had several pregnancies.  Having a buildup of fluid inside the abdomen (ascites). What are the signs or symptoms? The only symptom of a ventral hernia may be a painless bulge in the abdomen. A reducible hernia may be visible only when you strain, cough, or lift. Other symptoms may include:  Dull pain.  A feeling of pressure. Signs and symptoms of a strangulated hernia may include:  Increasing pain.  Nausea and vomiting.  Pain when pressing on the hernia.  The skin over the hernia turning red or purple.  Constipation.  Blood in the stool (feces). How is this diagnosed? This condition may be diagnosed based on:  Your symptoms.  Your medical history.  A physical exam. You may be asked to cough or strain while standing. These actions increase the pressure inside your abdomen and force the hernia through the opening in your muscles. Your health care provider may try to reduce the hernia by pressing on it.  Imaging studies, such as an ultrasound or CT scan. How is this treated? This condition is treated with surgery. If you have a strangulated hernia, surgery is done as soon as possible. If your hernia is small and not incarcerated, you may be asked to lose some weight before surgery. Follow these instructions at home:  Follow instructions from your health care provider about eating or drinking restrictions.  If you are overweight, your health care  provider may recommend that you increase your activity level and eat a healthier diet.  Do not lift anything that is heavier than 10 lb (4.5 kg).  Return to your normal activities as told by your health care provider. Ask your health care provider what activities are safe for you. You may need to avoid activities that increase pressure on your hernia.  Take over-the-counter and prescription medicines only as  told by your health care provider.  Keep all follow-up visits as told by your health care provider. This is important. Contact a health care provider if:  Your hernia gets larger.  Your hernia becomes painful. Get help right away if:  Your hernia becomes increasingly painful.  You have pain along with any of the following: ? Changes in skin color in the area of the hernia. ? Nausea. ? Vomiting. ? Fever. Summary  A ventral hernia is a bulge of tissue from inside the abdomen that pushes through a weak area of the muscles that form the front wall of the abdomen.  This condition is treated with surgery, which may be urgent depending on your hernia.  Do not lift anything that is heavier than 10 lb (4.5 kg), and follow activity instructions from your health care provider. This information is not intended to replace advice given to you by your health care provider. Make sure you discuss any questions you have with your health care provider. Document Revised: 08/11/2017 Document Reviewed: 01/18/2017 Elsevier Patient Education  [place].

## 2020-05-15 DIAGNOSIS — L97519 Non-pressure chronic ulcer of other part of right foot with unspecified severity: Secondary | ICD-10-CM | POA: Diagnosis not present

## 2020-05-15 DIAGNOSIS — Z7984 Long term (current) use of oral hypoglycemic drugs: Secondary | ICD-10-CM | POA: Diagnosis not present

## 2020-05-15 DIAGNOSIS — I252 Old myocardial infarction: Secondary | ICD-10-CM | POA: Diagnosis not present

## 2020-05-15 DIAGNOSIS — I10 Essential (primary) hypertension: Secondary | ICD-10-CM | POA: Diagnosis not present

## 2020-05-15 DIAGNOSIS — L97512 Non-pressure chronic ulcer of other part of right foot with fat layer exposed: Secondary | ICD-10-CM | POA: Diagnosis not present

## 2020-05-15 DIAGNOSIS — Z951 Presence of aortocoronary bypass graft: Secondary | ICD-10-CM | POA: Diagnosis not present

## 2020-05-15 DIAGNOSIS — Z7982 Long term (current) use of aspirin: Secondary | ICD-10-CM | POA: Diagnosis not present

## 2020-05-15 DIAGNOSIS — E10621 Type 1 diabetes mellitus with foot ulcer: Secondary | ICD-10-CM | POA: Diagnosis not present

## 2020-05-15 DIAGNOSIS — L84 Corns and callosities: Secondary | ICD-10-CM | POA: Diagnosis not present

## 2020-05-15 DIAGNOSIS — M19071 Primary osteoarthritis, right ankle and foot: Secondary | ICD-10-CM | POA: Diagnosis not present

## 2020-05-15 DIAGNOSIS — E11621 Type 2 diabetes mellitus with foot ulcer: Secondary | ICD-10-CM | POA: Diagnosis not present

## 2020-05-15 DIAGNOSIS — M19072 Primary osteoarthritis, left ankle and foot: Secondary | ICD-10-CM | POA: Diagnosis not present

## 2020-05-31 ENCOUNTER — Ambulatory Visit: Payer: Medicare HMO | Admitting: Podiatry

## 2020-05-31 ENCOUNTER — Other Ambulatory Visit: Payer: Self-pay

## 2020-05-31 DIAGNOSIS — L97512 Non-pressure chronic ulcer of other part of right foot with fat layer exposed: Secondary | ICD-10-CM

## 2020-05-31 DIAGNOSIS — E1159 Type 2 diabetes mellitus with other circulatory complications: Secondary | ICD-10-CM

## 2020-06-04 ENCOUNTER — Encounter: Payer: Self-pay | Admitting: Podiatry

## 2020-06-04 NOTE — Progress Notes (Signed)
Subjective:  Patient ID: [name] [name], male    DOB: 06-05-1951,  MRN: [id number]  Chief Complaint  Patient presents with  . Wound Check    Right foot great toe wound check. PT denies any pain at this time and has no concerns     69 y.o. male presents for wound care.  Patient presents with a follow-up of right hallux IPJ ulceration with underlying metatarsophalangeal joint arthritis.  Patient is a diabetic with last A1c of 6.7.  He states that he is being managed at the wound care center and seems to be doing great.  He has a vascular study that is scheduled.  He denies any other acute complaints.  He just here for his routine checkup.   Review of Systems: Negative except as noted in the HPI. Denies N/V/F/Ch.  Past Medical History:  Diagnosis Date  . Anxiety   . 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 Arthritis   . Coronary artery disease    NSTEMI [id number]; multivessel CAD s/p post CABG (LIMA-LAD, SVG-D1, SVG-D2, SVG-OM)  . Essential hypertension   . GERD (gastroesophageal reflux disease)   . Gout   . History of kidney stones   . History of peptic ulcer disease   . Hyperlipidemia   . Left ureteral stone   . Nodule of right lung    CT 04-04-2017  right lower lobe multiple nodules  . OSA on CPAP   . Renal cyst, right    CT 04-04-2017  . Renal insufficiency   . Type 2 diabetes mellitus ([place])   . Wears glasses     Current Outpatient Medications:  .  allopurinol (ZYLOPRIM) 100 MG tablet, Take 300 mg by mouth daily., Disp: , Rfl:  .  allopurinol (ZYLOPRIM) 300 MG tablet, Take 300 mg by mouth daily., Disp: , Rfl:  .  Ascorbic Acid (VITAMIN C) 1000 MG tablet, Take 1,000 mg by mouth daily., Disp: , Rfl:  .  aspirin EC 325 MG EC tablet, Take 1 tablet (325 mg total) by mouth daily. (Patient taking differently: Take 325 mg by mouth every morning. ), Disp: 30 tablet, Rfl: 0 .  colchicine 0.6 MG tablet, Take 0.6 mg by mouth daily. , Disp: , Rfl:   .  escitalopram (LEXAPRO) 10 MG tablet, Take 10 mg by mouth at bedtime. , Disp: , Rfl:  .  lisinopril (PRINIVIL,ZESTRIL) 10 MG tablet, Take 5 mg by mouth every morning. , Disp: , Rfl:  .  lovastatin (MEVACOR) 20 MG tablet, TAKE ONE TABLET BY MOUTH AT BEDTIME, STOP TAKING ATORVASTATIN, Disp: 90 tablet, Rfl: 0 .  metFORMIN (GLUCOPHAGE) 500 MG tablet, Take 1 tablet (500 mg total) by mouth 2 (two) times daily., Disp: 180 tablet, Rfl: 0 .  metoprolol tartrate (LOPRESSOR) 25 MG tablet, Take 0.5 tablets (12.5 mg total) by mouth 2 (two) times daily., Disp: 90 tablet, Rfl: 1 .  Multiple Vitamin (MULTIVITAMIN WITH MINERALS) TABS tablet, Take 1 tablet by mouth daily., Disp: , Rfl:  .  Omega-3 Fatty Acids (FISH OIL) 1000 MG CPDR, Take 1 capsule by mouth 2 (two) times a day. , Disp: , Rfl:  .  omeprazole (PRILOSEC) 20 MG capsule, Take 20 mg by mouth every morning. , Disp: , Rfl:  .  pyridoxine (B-6) 200 MG tablet, Take 200 mg by mouth daily., Disp: , Rfl:   Social History   Tobacco Use  Smoking Status Never Smoker  Smokeless Tobacco Never Used  No Known Allergies Objective:  There were no vitals filed for this visit. There is no height or weight on file to calculate BMI. Constitutional Well developed. Well nourished.  Vascular Dorsalis pedis pulses palpable bilaterally. Posterior tibial pulses palpable bilaterally. Capillary refill normal to all digits.  No cyanosis or clubbing noted. Pedal hair growth normal.  Neurologic Normal speech. Oriented to person, place, and time. Protective sensation absent  Dermatologic Wound Location: Right hallux IPJ ulceration with fat layer exposed Wound Base: Granular/Healthy Peri-wound: Calloused Exudate: Scant/small amount Serous exudate Wound Measurements: -See below  Orthopedic: No pain to palpation either foot.   Radiographs: None Assessment:   1. Chronic ulcer of great toe of right foot with fat layer exposed ([place])   2. DM type 2 causing  vascular disease ([place])    Plan:  Patient was evaluated and treated and all questions answered.  Ulcer right hallux IPJ ulceration with fat layer exposed -The wound is primarily being managed at the wound care center.  At this time I will defer further wound care management recommendation to them. -Patient is also obtaining new ABIs PVR studies in the future for further work-up.  The wound appears to be in a stable position. -I discussed with the patient that if there is anything that I can do to surgically help manage the wound I am more than happy to do so at this time I will defer further care over to the wound care center and vascular work-up after the vascular work-up is done if the wound is not improving we will discuss surgical options to help correct the underlying bony deformity and therefore taken the pressure away.  Patient states understanding No follow-ups on file.

## 2020-06-11 ENCOUNTER — Other Ambulatory Visit: Payer: Self-pay

## 2020-06-11 ENCOUNTER — Ambulatory Visit (HOSPITAL_COMMUNITY)
Admission: RE | Admit: 2020-06-11 | Discharge: 2020-06-11 | Disposition: A | Discharge status: Home / Self Care | Payer: Medicare HMO | Source: Ambulatory Visit | Attending: General Surgery | Admitting: General Surgery

## 2020-06-11 DIAGNOSIS — K4020 Bilateral inguinal hernia, without obstruction or gangrene, not specified as recurrent: Secondary | ICD-10-CM | POA: Diagnosis not present

## 2020-06-11 DIAGNOSIS — D373 Neoplasm of uncertain behavior of appendix: Secondary | ICD-10-CM | POA: Diagnosis present

## 2020-06-11 DIAGNOSIS — Z9889 Other specified postprocedural states: Secondary | ICD-10-CM | POA: Diagnosis not present

## 2020-06-11 DIAGNOSIS — N281 Cyst of kidney, acquired: Secondary | ICD-10-CM | POA: Diagnosis not present

## 2020-06-11 DIAGNOSIS — I700 Atherosclerosis of aorta: Secondary | ICD-10-CM | POA: Diagnosis not present

## 2020-06-11 LAB — POCT I-STAT CREATININE: Creatinine, Ser: 1.6 mg/dL — ABNORMAL HIGH (ref 0.61–1.24)

## 2020-06-11 MED ORDER — IOHEXOL 300 MG/ML  SOLN
100.0000 mL | Freq: Once | INTRAMUSCULAR | Status: AC | PRN
  Administered 2020-06-11: 100 mL via INTRAVENOUS

## 2020-06-12 DIAGNOSIS — L97519 Non-pressure chronic ulcer of other part of right foot with unspecified severity: Secondary | ICD-10-CM | POA: Diagnosis not present

## 2020-06-12 DIAGNOSIS — E1140 Type 2 diabetes mellitus with diabetic neuropathy, unspecified: Secondary | ICD-10-CM | POA: Diagnosis not present

## 2020-06-12 DIAGNOSIS — X58XXXA Exposure to other specified factors, initial encounter: Secondary | ICD-10-CM | POA: Diagnosis not present

## 2020-06-12 DIAGNOSIS — S90421A Blister (nonthermal), right great toe, initial encounter: Secondary | ICD-10-CM | POA: Diagnosis not present

## 2020-06-12 DIAGNOSIS — R21 Rash and other nonspecific skin eruption: Secondary | ICD-10-CM | POA: Diagnosis not present

## 2020-06-12 DIAGNOSIS — I10 Essential (primary) hypertension: Secondary | ICD-10-CM | POA: Diagnosis not present

## 2020-06-12 DIAGNOSIS — E11621 Type 2 diabetes mellitus with foot ulcer: Secondary | ICD-10-CM | POA: Diagnosis not present

## 2020-06-12 DIAGNOSIS — M19072 Primary osteoarthritis, left ankle and foot: Secondary | ICD-10-CM | POA: Diagnosis not present

## 2020-06-12 DIAGNOSIS — M19071 Primary osteoarthritis, right ankle and foot: Secondary | ICD-10-CM | POA: Diagnosis not present

## 2020-06-12 DIAGNOSIS — L84 Corns and callosities: Secondary | ICD-10-CM | POA: Diagnosis not present

## 2020-06-12 DIAGNOSIS — L97512 Non-pressure chronic ulcer of other part of right foot with fat layer exposed: Secondary | ICD-10-CM | POA: Diagnosis not present

## 2020-06-13 ENCOUNTER — Telehealth (INDEPENDENT_AMBULATORY_CARE_PROVIDER_SITE_OTHER): Payer: Medicare HMO | Admitting: General Surgery

## 2020-06-13 DIAGNOSIS — D373 Neoplasm of uncertain behavior of appendix: Secondary | ICD-10-CM

## 2020-06-13 DIAGNOSIS — R918 Other nonspecific abnormal finding of lung field: Secondary | ICD-10-CM

## 2020-06-13 NOTE — Telephone Encounter (Signed)
[facility]  CLINICAL DATA:  History of mucinous neoplasm of the appendix status post surgical resection CT abdomen pelvis February 24 21  EXAM: CT ABDOMEN AND PELVIS WITH CONTRAST  TECHNIQUE: Multidetector CT imaging of the abdomen and pelvis was performed using the standard protocol following bolus administration of intravenous contrast.  CONTRAST:  135mL OMNIPAQUE IOHEXOL 300 MG/ML  SOLN  COMPARISON:  CT chest abdomen and pelvis September 06 2019.  FINDINGS: Lower chest: No significant change in size of the index and non index pulmonary nodules. No new suspicious pulmonary nodules or mass in the visualized lung fields. Index pulmonary nodules are as follows:  -unchanged size of the 4 mm left lower lobe pulmonary nodule (series 5, image 18)  -unchanged size of the 3 mm right lower lobe pulmonary nodule (series 5, image 16)  -unchanged size of the 10 mm nodule within the inferior aspect of the right middle lobe (series 5, image 10).  No pneumothorax or pleural effusion.  Prior median sternotomy.  Hepatobiliary: No focal liver abnormality is seen. No gallstones, gallbladder wall thickening, or biliary dilatation.  Pancreas: Unremarkable. No pancreatic ductal dilatation or surrounding inflammatory changes.  Spleen: Normal in size without focal abnormality.  Adrenals/Urinary Tract: Adrenal glands are unremarkable. Kidneys are normal, without renal calculi, focal suspicious lesion, or hydronephrosis. Right renal cyst. Bladder is unremarkable.  Stomach/Bowel: Radiopaque enteric contrast visualized to the level of the rectum. Stable postsurgical changes of hemicolectomy. Stomach is within normal limits. No evidence of bowel wall thickening, distention, or inflammatory changes.  Vascular/Lymphatic: Aortic atherosclerosis. No enlarged abdominal or pelvic lymph nodes.  Reproductive: Prostate is unremarkable.  Other: No ascites.   Bilateral fat containing inguinal hernias.  Musculoskeletal: Multilevel degenerate change of the thoracolumbar spine with flowing lower thoracic anterior osteophytes. No suspicious lytic or blastic osseous lesions.  IMPRESSION: 1. No CT evidence of recurrent or metastatic disease within the abdomen or pelvis. 2. Stable postsurgical changes of hemicolectomy. 3. Unchanged size of the index and non index pulmonary nodules common visualized lungs. Which are favored to be benign given long-term stability. Recommend attention on follow-up imaging. 4. Aortic atherosclerosis.  Aortic Atherosclerosis (ICD10-I70.0).   Electronically Signed   By: Dahlia Bailiff MD   On: 06/11/2020 14:37  No evidence of disease. Spoke with wife Vita Barley, she had seen on mychart already. They will call with questions or concerns. She is worried about Creatinine and told her to make sure talk with PCP and keep check on it.   Curlene Labrum, MD [facility] [address] [place], [place] [address] [phone] (office)

## 2020-06-19 DIAGNOSIS — S90421A Blister (nonthermal), right great toe, initial encounter: Secondary | ICD-10-CM | POA: Diagnosis not present

## 2020-06-19 DIAGNOSIS — I10 Essential (primary) hypertension: Secondary | ICD-10-CM | POA: Diagnosis not present

## 2020-06-19 DIAGNOSIS — M19072 Primary osteoarthritis, left ankle and foot: Secondary | ICD-10-CM | POA: Diagnosis not present

## 2020-06-19 DIAGNOSIS — R21 Rash and other nonspecific skin eruption: Secondary | ICD-10-CM | POA: Diagnosis not present

## 2020-06-19 DIAGNOSIS — E11621 Type 2 diabetes mellitus with foot ulcer: Secondary | ICD-10-CM | POA: Diagnosis not present

## 2020-06-19 DIAGNOSIS — M19071 Primary osteoarthritis, right ankle and foot: Secondary | ICD-10-CM | POA: Diagnosis not present

## 2020-06-19 DIAGNOSIS — X58XXXA Exposure to other specified factors, initial encounter: Secondary | ICD-10-CM | POA: Diagnosis not present

## 2020-06-19 DIAGNOSIS — L97512 Non-pressure chronic ulcer of other part of right foot with fat layer exposed: Secondary | ICD-10-CM | POA: Diagnosis not present

## 2020-06-19 DIAGNOSIS — E1140 Type 2 diabetes mellitus with diabetic neuropathy, unspecified: Secondary | ICD-10-CM | POA: Diagnosis not present

## 2020-06-19 DIAGNOSIS — L97519 Non-pressure chronic ulcer of other part of right foot with unspecified severity: Secondary | ICD-10-CM | POA: Diagnosis not present

## 2020-06-19 DIAGNOSIS — L84 Corns and callosities: Secondary | ICD-10-CM | POA: Diagnosis not present

## 2020-06-24 ENCOUNTER — Ambulatory Visit (INDEPENDENT_AMBULATORY_CARE_PROVIDER_SITE_OTHER): Payer: Medicare HMO | Admitting: Vascular Surgery

## 2020-06-24 ENCOUNTER — Other Ambulatory Visit: Payer: Self-pay

## 2020-06-24 ENCOUNTER — Encounter: Payer: Self-pay | Admitting: Vascular Surgery

## 2020-06-24 VITALS — BP 154/90 | HR 62 | Temp 97.8°F | Resp 12 | Ht 74.0 in | Wt 228.0 lb

## 2020-06-24 DIAGNOSIS — I739 Peripheral vascular disease, unspecified: Secondary | ICD-10-CM

## 2020-06-24 NOTE — Progress Notes (Signed)
[facility] [place]  Patient name: [name] [name] MRN: [id number] DOB: 1950/12/01 Sex: male  REASON FOR CONSULT: Evaluation arterial insufficiency right foot  HPI: [name] [name] is a 70 y.o. male, here today for evaluation of arterial flow to his lower extremities.  He is a long-term diabetic.  He rubbed an ulcer on the plantar aspect of his right great toe approximately 3 to 4 months ago and has had slow healing with this.  He does not have pain.  He denies any history of calf claudication or arterial rest pain.  He is seen in the wound center in [place] and is here today for evaluation of his arterial flow.  He does have history of prior coronary artery disease and underwent coronary bypass grafting in 2014 and subsequent stenting since then.  Past Medical History:  Diagnosis Date  . Anxiety   . 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 Arthritis   . Coronary artery disease    NSTEMI [id number]; multivessel CAD s/p post CABG (LIMA-LAD, SVG-D1, SVG-D2, SVG-OM)  . Essential hypertension   . GERD (gastroesophageal reflux disease)   . Gout   . History of kidney stones   . History of peptic ulcer disease   . Hyperlipidemia   . Left ureteral stone   . Nodule of right lung    CT 04-04-2017  right lower lobe multiple nodules  . OSA on CPAP   . Renal cyst, right    CT 04-04-2017  . Renal insufficiency   . Type 2 diabetes mellitus ([place])   . Wears glasses     Family History  Problem Relation Age of Onset  . CAD Mother   . CAD Father   . Lung cancer Father   . Colon cancer Father     SOCIAL HISTORY: Social History   Socioeconomic History  . Marital status: Married    Spouse name: Not on file  . Number of children: Not on file  . Years of education: Not on file  . Highest education level: Not on file  Occupational History  . Occupation: Drives Chief Operating Officer: [employer]  Tobacco Use  . Smoking status:  Never Smoker  . Smokeless tobacco: Never Used  Vaping Use  . Vaping Use: Never used  Substance and Sexual Activity  . Alcohol use: No    Alcohol/week: 0.0 standard drinks  . Drug use: No  . Sexual activity: Yes    Birth control/protection: None  Other Topics Concern  . Not on file  Social History Narrative   Lives with wife, does not exercise but part of his job is strenuous. Works in a body-shop part-time, too.   2 of his mother's sisters and 1 brother died suddenly (family was told MI but no autopsy).    Social Determinants of Health   Financial Resource Strain: Not on file  Food Insecurity: Not on file  Transportation Needs: Not on file  Physical Activity: Not on file  Stress: Not on file  Social Connections: Not on file  Intimate Partner Violence: Not on file    No Known Allergies  Current Outpatient Medications  Medication Sig Dispense Refill  . allopurinol (ZYLOPRIM) 100 MG tablet Take 100 mg by mouth daily.    . Ascorbic Acid (VITAMIN C) 1000 MG tablet Take 1,000 mg by mouth daily.    Marland Kitchen aspirin EC 325 MG EC tablet Take 1 tablet (325 mg  total) by mouth daily. (Patient taking differently: Take 325 mg by mouth every morning.) 30 tablet 0  . colchicine 0.6 MG tablet Take 0.6 mg by mouth daily.     Marland Kitchen escitalopram (LEXAPRO) 10 MG tablet Take 10 mg by mouth at bedtime.     Marland Kitchen lisinopril (PRINIVIL,ZESTRIL) 10 MG tablet Take 5 mg by mouth every morning.     . lovastatin (MEVACOR) 20 MG tablet TAKE ONE TABLET BY MOUTH AT BEDTIME, STOP TAKING ATORVASTATIN 90 tablet 0  . metFORMIN (GLUCOPHAGE) 500 MG tablet Take 1 tablet (500 mg total) by mouth 2 (two) times daily. 180 tablet 0  . metoprolol tartrate (LOPRESSOR) 25 MG tablet Take 0.5 tablets (12.5 mg total) by mouth 2 (two) times daily. 90 tablet 1  . Multiple Vitamin (MULTIVITAMIN WITH MINERALS) TABS tablet Take 1 tablet by mouth daily.    . Omega-3 Fatty Acids (FISH OIL) 1000 MG CPDR Take 1 capsule by mouth 2 (two) times a day.      Marland Kitchen omeprazole (PRILOSEC) 20 MG capsule Take 20 mg by mouth every morning.     . pyridoxine (B-6) 200 MG tablet Take 200 mg by mouth daily.     No current facility-administered medications for this visit.    REVIEW OF SYSTEMS:  [X]  denotes positive finding, [ ]  denotes negative finding Cardiac  Comments:  Chest pain or chest pressure:    Shortness of breath upon exertion:    Short of breath when lying flat:    Irregular heart rhythm:        Vascular    Pain in calf, thigh, or hip brought on by ambulation:    Pain in feet at night that wakes you up from your sleep:     Blood clot in your veins:    Leg swelling:         Pulmonary    Oxygen at home:    Productive cough:     Wheezing:         Neurologic    Sudden weakness in arms or legs:     Sudden numbness in arms or legs:     Sudden onset of difficulty speaking or slurred speech:    Temporary loss of vision in one eye:     Problems with dizziness:         Gastrointestinal    Blood in stool:     Vomited blood:         Genitourinary    Burning when urinating:     Blood in urine:        Psychiatric    Major depression:         Hematologic    Bleeding problems:    Problems with blood clotting too easily:        Skin    Rashes or ulcers: x       Constitutional    Fever or chills:      PHYSICAL EXAM: Vitals:   06/24/20 0829  BP: (!) 154/90  Pulse: 62  Resp: 12  Temp: 97.8 F (36.6 C)  TempSrc: Other (Comment)  SpO2: 96%  Weight: 228 lb (103.4 kg)  Height: 6\' 2"  (1.88 m)    GENERAL: The patient is a well-nourished male, in no acute distress. The vital signs are documented above. VASCULAR: 2+ radial, 2+ femoral, 2+ popliteal and 2+ dorsalis pedis and posterior tibial pulses bilaterally PULMONARY: There is good air exchange ABDOMEN: Soft and non-tender  MUSCULOSKELETAL: There are no major deformities  or cyanosis. NEUROLOGIC: No focal weakness or paresthesias are detected. SKIN: Callus formation on the  plantar aspect of his great toe with a 1 x 2 cm ulceration with a clean base PSYCHIATRIC: The patient has a normal affect.  DATA:   Noninvasive studies from [facility] revealed incompressible arteries therefore ankle arm index was unobtainable  MEDICAL ISSUES:  Had a long discussion with the patient.  He does have calcification of his lower extremity arteries therefore make his ankle arm index is not reliable.  Fortunately has normal arterial exam with normal dorsalis pedis and posterior tibial pulses bilaterally  He did undergo noninvasive studies in 2014 prior to his coronary bypass and this time he had normal ankle arm index and normal triphasic flow as well.  His CT of his abdomen for follow-up of his hemicolectomy also shows calcification in the aorta iliac segments but no evidence of flow-limiting aortoiliac occlusive disease.  He was reassured with this discussion will continue his wound care and eating.  I did explain there is an outside chance that he would require toe amputation but should have adequate flow for healing this if it is required.  He will see [place] again on an as-needed basis   Rosetta Posner, MD [facility] [place] Office phone [phone]

## 2020-06-26 DIAGNOSIS — I252 Old myocardial infarction: Secondary | ICD-10-CM | POA: Diagnosis not present

## 2020-06-26 DIAGNOSIS — I10 Essential (primary) hypertension: Secondary | ICD-10-CM | POA: Diagnosis not present

## 2020-06-26 DIAGNOSIS — E11621 Type 2 diabetes mellitus with foot ulcer: Secondary | ICD-10-CM | POA: Diagnosis not present

## 2020-06-26 DIAGNOSIS — L97512 Non-pressure chronic ulcer of other part of right foot with fat layer exposed: Secondary | ICD-10-CM | POA: Diagnosis not present

## 2020-06-26 DIAGNOSIS — S90421A Blister (nonthermal), right great toe, initial encounter: Secondary | ICD-10-CM | POA: Diagnosis not present

## 2020-06-26 DIAGNOSIS — Z7984 Long term (current) use of oral hypoglycemic drugs: Secondary | ICD-10-CM | POA: Diagnosis not present

## 2020-06-26 DIAGNOSIS — X58XXXA Exposure to other specified factors, initial encounter: Secondary | ICD-10-CM | POA: Diagnosis not present

## 2020-06-26 DIAGNOSIS — Z951 Presence of aortocoronary bypass graft: Secondary | ICD-10-CM | POA: Diagnosis not present

## 2020-06-26 DIAGNOSIS — E10621 Type 1 diabetes mellitus with foot ulcer: Secondary | ICD-10-CM | POA: Diagnosis not present

## 2020-06-26 DIAGNOSIS — R21 Rash and other nonspecific skin eruption: Secondary | ICD-10-CM | POA: Diagnosis not present

## 2020-06-26 DIAGNOSIS — E1140 Type 2 diabetes mellitus with diabetic neuropathy, unspecified: Secondary | ICD-10-CM | POA: Diagnosis not present

## 2020-07-03 DIAGNOSIS — L97512 Non-pressure chronic ulcer of other part of right foot with fat layer exposed: Secondary | ICD-10-CM | POA: Diagnosis not present

## 2020-07-03 DIAGNOSIS — S90421A Blister (nonthermal), right great toe, initial encounter: Secondary | ICD-10-CM | POA: Diagnosis not present

## 2020-07-03 DIAGNOSIS — R21 Rash and other nonspecific skin eruption: Secondary | ICD-10-CM | POA: Diagnosis not present

## 2020-07-03 DIAGNOSIS — X58XXXA Exposure to other specified factors, initial encounter: Secondary | ICD-10-CM | POA: Diagnosis not present

## 2020-07-03 DIAGNOSIS — I10 Essential (primary) hypertension: Secondary | ICD-10-CM | POA: Diagnosis not present

## 2020-07-03 DIAGNOSIS — E11621 Type 2 diabetes mellitus with foot ulcer: Secondary | ICD-10-CM | POA: Diagnosis not present

## 2020-07-03 DIAGNOSIS — Z7984 Long term (current) use of oral hypoglycemic drugs: Secondary | ICD-10-CM | POA: Diagnosis not present

## 2020-07-03 DIAGNOSIS — E10621 Type 1 diabetes mellitus with foot ulcer: Secondary | ICD-10-CM | POA: Diagnosis not present

## 2020-07-03 DIAGNOSIS — I252 Old myocardial infarction: Secondary | ICD-10-CM | POA: Diagnosis not present

## 2020-07-03 DIAGNOSIS — E1140 Type 2 diabetes mellitus with diabetic neuropathy, unspecified: Secondary | ICD-10-CM | POA: Diagnosis not present

## 2020-07-03 DIAGNOSIS — Z951 Presence of aortocoronary bypass graft: Secondary | ICD-10-CM | POA: Diagnosis not present

## 2020-07-10 DIAGNOSIS — I252 Old myocardial infarction: Secondary | ICD-10-CM | POA: Diagnosis not present

## 2020-07-10 DIAGNOSIS — S90421A Blister (nonthermal), right great toe, initial encounter: Secondary | ICD-10-CM | POA: Diagnosis not present

## 2020-07-10 DIAGNOSIS — Z951 Presence of aortocoronary bypass graft: Secondary | ICD-10-CM | POA: Diagnosis not present

## 2020-07-10 DIAGNOSIS — L97512 Non-pressure chronic ulcer of other part of right foot with fat layer exposed: Secondary | ICD-10-CM | POA: Diagnosis not present

## 2020-07-10 DIAGNOSIS — E11621 Type 2 diabetes mellitus with foot ulcer: Secondary | ICD-10-CM | POA: Diagnosis not present

## 2020-07-10 DIAGNOSIS — X58XXXA Exposure to other specified factors, initial encounter: Secondary | ICD-10-CM | POA: Diagnosis not present

## 2020-07-10 DIAGNOSIS — I10 Essential (primary) hypertension: Secondary | ICD-10-CM | POA: Diagnosis not present

## 2020-07-10 DIAGNOSIS — Z7984 Long term (current) use of oral hypoglycemic drugs: Secondary | ICD-10-CM | POA: Diagnosis not present

## 2020-07-10 DIAGNOSIS — R21 Rash and other nonspecific skin eruption: Secondary | ICD-10-CM | POA: Diagnosis not present

## 2020-07-10 DIAGNOSIS — E10621 Type 1 diabetes mellitus with foot ulcer: Secondary | ICD-10-CM | POA: Diagnosis not present

## 2020-07-10 DIAGNOSIS — E1140 Type 2 diabetes mellitus with diabetic neuropathy, unspecified: Secondary | ICD-10-CM | POA: Diagnosis not present

## 2020-07-16 ENCOUNTER — Encounter: Payer: Medicare HMO | Admitting: Vascular Surgery

## 2020-07-17 ENCOUNTER — Ambulatory Visit (INDEPENDENT_AMBULATORY_CARE_PROVIDER_SITE_OTHER): Payer: Medicare HMO

## 2020-07-17 ENCOUNTER — Ambulatory Visit (INDEPENDENT_AMBULATORY_CARE_PROVIDER_SITE_OTHER): Payer: Medicare HMO | Admitting: Podiatry

## 2020-07-17 ENCOUNTER — Other Ambulatory Visit: Payer: Self-pay

## 2020-07-17 DIAGNOSIS — M19071 Primary osteoarthritis, right ankle and foot: Secondary | ICD-10-CM

## 2020-07-17 DIAGNOSIS — L97512 Non-pressure chronic ulcer of other part of right foot with fat layer exposed: Secondary | ICD-10-CM

## 2020-07-17 DIAGNOSIS — E1159 Type 2 diabetes mellitus with other circulatory complications: Secondary | ICD-10-CM

## 2020-07-17 DIAGNOSIS — M898X9 Other specified disorders of bone, unspecified site: Secondary | ICD-10-CM | POA: Diagnosis not present

## 2020-07-24 ENCOUNTER — Encounter: Payer: Self-pay | Admitting: Podiatry

## 2020-07-24 DIAGNOSIS — E1121 Type 2 diabetes mellitus with diabetic nephropathy: Secondary | ICD-10-CM | POA: Diagnosis not present

## 2020-07-24 DIAGNOSIS — N1831 Chronic kidney disease, stage 3a: Secondary | ICD-10-CM | POA: Diagnosis not present

## 2020-07-24 NOTE — Progress Notes (Signed)
Subjective:  Patient ID: [name] [name], male    DOB: 12-05-1950,  MRN: [id number]  Chief Complaint  Patient presents with  . Foot Pain  . Wound Check    Right great toe wound check. PT stated that he is doing well he has no concerns at this time.    70 y.o. male presents for wound care.  Patient presents with a follow-up of right hallux IPJ ulceration with underlying metatarsophalangeal joint arthritis.  Patient is a diabetic with last A1c of 6.7.  He states that he is being managed at the wound care center and seems to be doing great.  He has a vascular study that is scheduled.  He denies any other acute complaints.  He just here for his routine checkup.   Review of Systems: Negative except as noted in the HPI. Denies N/V/F/Ch.  Past Medical History:  Diagnosis Date  . Anxiety   . 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 Arthritis   . Coronary artery disease    NSTEMI [id number]; multivessel CAD s/p post CABG (LIMA-LAD, SVG-D1, SVG-D2, SVG-OM)  . Essential hypertension   . GERD (gastroesophageal reflux disease)   . Gout   . History of kidney stones   . History of peptic ulcer disease   . Hyperlipidemia   . Left ureteral stone   . Nodule of right lung    CT 04-04-2017  right lower lobe multiple nodules  . OSA on CPAP   . Renal cyst, right    CT 04-04-2017  . Renal insufficiency   . Type 2 diabetes mellitus ([place])   . Wears glasses     Current Outpatient Medications:  .  allopurinol (ZYLOPRIM) 100 MG tablet, Take 100 mg by mouth daily., Disp: , Rfl:  .  Ascorbic Acid (VITAMIN C) 1000 MG tablet, Take 1,000 mg by mouth daily., Disp: , Rfl:  .  aspirin EC 325 MG EC tablet, Take 1 tablet (325 mg total) by mouth daily. (Patient taking differently: Take 325 mg by mouth every morning.), Disp: 30 tablet, Rfl: 0 .  colchicine 0.6 MG tablet, Take 0.6 mg by mouth daily. , Disp: , Rfl:  .  escitalopram (LEXAPRO) 10 MG tablet, Take 10 mg by mouth at  bedtime. , Disp: , Rfl:  .  lisinopril (PRINIVIL,ZESTRIL) 10 MG tablet, Take 5 mg by mouth every morning. , Disp: , Rfl:  .  lovastatin (MEVACOR) 20 MG tablet, TAKE ONE TABLET BY MOUTH AT BEDTIME, STOP TAKING ATORVASTATIN, Disp: 90 tablet, Rfl: 0 .  metFORMIN (GLUCOPHAGE) 500 MG tablet, Take 1 tablet (500 mg total) by mouth 2 (two) times daily., Disp: 180 tablet, Rfl: 0 .  metoprolol tartrate (LOPRESSOR) 25 MG tablet, Take 0.5 tablets (12.5 mg total) by mouth 2 (two) times daily., Disp: 90 tablet, Rfl: 1 .  Multiple Vitamin (MULTIVITAMIN WITH MINERALS) TABS tablet, Take 1 tablet by mouth daily., Disp: , Rfl:  .  Omega-3 Fatty Acids (FISH OIL) 1000 MG CPDR, Take 1 capsule by mouth 2 (two) times a day. , Disp: , Rfl:  .  omeprazole (PRILOSEC) 20 MG capsule, Take 20 mg by mouth every morning. , Disp: , Rfl:  .  pyridoxine (B-6) 200 MG tablet, Take 200 mg by mouth daily., Disp: , Rfl:   Social History   Tobacco Use  Smoking Status Never Smoker  Smokeless Tobacco Never Used    No Known Allergies Objective:  There were no vitals filed for this visit.  There is no height or weight on file to calculate BMI. Constitutional Well developed. Well nourished.  Vascular Dorsalis pedis pulses palpable bilaterally. Posterior tibial pulses palpable bilaterally. Capillary refill normal to all digits.  No cyanosis or clubbing noted. Pedal hair growth normal.  Neurologic Normal speech. Oriented to person, place, and time. Protective sensation absent  Dermatologic Wound Location: Right hallux IPJ ulceration with fat layer exposed Wound Base: Granular/Healthy Peri-wound: Calloused Exudate: Scant/small amount Serous exudate Wound Measurements: -See below  Hallux rigidus noted with no motion at the first metatarsophalangeal joint.  Pain on palpation of the first metatarsophalangeal joint.  With right hallux IPJ ulceration as well.  Does not probe down to bone.  No other clinical signs of infection noted.   Orthopedic: No pain to palpation either foot.   Radiographs: 3 views of skeletally mature adult right foot: Severe osteoarthritic changes noted to the first metatarsophalangeal joint.  Cystic changes noted to the proximal phalanx as well.  Sesamoid position are intact.  Osteophytes noted.  No other bony abnormalities identified.  No signs of osteomyelitis noted. Assessment:   1. Chronic ulcer of great toe of right foot with fat layer exposed ([place])   2. DM type 2 causing vascular disease ([place])   3. Arthritis of first metatarsophalangeal (MTP) joint of right foot   4. Bony exostosis    Plan:  Patient was evaluated and treated and all questions answered.  Ulcer right hallux IPJ ulceration with fat layer exposed -The wound is primarily being managed at the wound care center.  For now continue local wound care until surgery -Patient is also obtaining new ABIs PVR studies in the future for further work-up.  The wound appears to be in a stable position. -Given that patient is not getting any better at the wound care center on the ulcer appears to be progressively getting worse.  We may need to discuss surgical management to help address the first metatarsophalangeal joint to help reduce the deformity and pressure at the IPJ joint.  I discussed with her that she will he will benefit from right first metatarsophalangeal joint fusion with exostectomy of the IPJ joint as well.  I would like to keep the interphalangeal joint intact to take the stress off of the fused first metatarsophalangeal joint.  I discussed this with the patient in extensive detail.  Patient agrees with the plan would like to proceed with the surgery given that he has failed all conservative treatment options to help close the wound.  Patient is a higher and higher risk of losing the digit versus the ray if the wound continues to progress worse. -I discussed my postop protocol as well as surgical plan in extensive detail with the  patient.  Patient states understanding would like to proceed despite all risks including amputation of the foot and leg. -Cam boot was dispensed -Informed surgical risk consent was reviewed and read aloud to the patient.  I reviewed the films.  I have discussed my findings with the patient in great detail.  I have discussed all risks including but not limited to infection, stiffness, scarring, limp, disability, deformity, damage to blood vessels and nerves, numbness, poor healing, need for braces, arthritis, chronic pain, amputation, death.  All benefits and realistic expectations discussed in great detail.  I have made no promises as to the outcome.  I have provided realistic expectations.  I have offered the patient a 2nd opinion, which they have declined and assured me they preferred to proceed despite  the risks -A total of 33 minutes was spent in direct patient care as well as pre and post patient encounter activities.  This includes documentation as well as reviewing patient chart for labs, imaging, past medical, surgical, social, and family history as documented in the EMR.  I have reviewed medication allergies as documented in EMR.  I discussed the etiology of condition and treatment options from conservative to surgical care.  All risks and benefit of the treatment course was discussed in detail.  All questions were answered and return appointment was discussed.  Since the visit completed in an ambulatory/outpatient setting, the patient and/or parent/guardian has been advised to contact the providers office for worsening condition and seek medical treatment and/or call 911 if the patient deems either is necessary.  No follow-ups on file.

## 2020-07-25 ENCOUNTER — Telehealth: Payer: Self-pay

## 2020-07-25 ENCOUNTER — Ambulatory Visit: Payer: Medicare HMO | Admitting: General Surgery

## 2020-07-25 ENCOUNTER — Other Ambulatory Visit: Payer: Self-pay

## 2020-07-25 ENCOUNTER — Encounter: Payer: Self-pay | Admitting: General Surgery

## 2020-07-25 VITALS — BP 145/75 | HR 59 | Temp 96.9°F | Resp 16 | Ht 74.0 in | Wt 233.0 lb

## 2020-07-25 DIAGNOSIS — L97519 Non-pressure chronic ulcer of other part of right foot with unspecified severity: Secondary | ICD-10-CM

## 2020-07-25 DIAGNOSIS — E11621 Type 2 diabetes mellitus with foot ulcer: Secondary | ICD-10-CM

## 2020-07-25 LAB — COMPREHENSIVE METABOLIC PANEL
ALT: 19 IU/L (ref 0–44)
AST: 19 IU/L (ref 0–40)
Albumin/Globulin Ratio: 2.3 — ABNORMAL HIGH (ref 1.2–2.2)
Albumin: 4.4 g/dL (ref 3.8–4.8)
Alkaline Phosphatase: 83 IU/L (ref 44–121)
BUN/Creatinine Ratio: 13 (ref 10–24)
BUN: 22 mg/dL (ref 8–27)
Bilirubin Total: 1 mg/dL (ref 0.0–1.2)
CO2: 22 mmol/L (ref 20–29)
Calcium: 10 mg/dL (ref 8.6–10.2)
Chloride: 105 mmol/L (ref 96–106)
Creatinine, Ser: 1.72 mg/dL — ABNORMAL HIGH (ref 0.76–1.27)
GFR calc Af Amer: 47 mL/min/{1.73_m2} — ABNORMAL LOW (ref >59)
GFR calc non Af Amer: 40 mL/min/{1.73_m2} — ABNORMAL LOW (ref >59)
Globulin, Total: 1.9 g/dL (ref 1.5–4.5)
Glucose: 162 mg/dL — ABNORMAL HIGH (ref 65–99)
Potassium: 4.5 mmol/L (ref 3.5–5.2)
Sodium: 141 mmol/L (ref 134–144)
Total Protein: 6.3 g/dL (ref 6.0–8.5)

## 2020-07-25 NOTE — Telephone Encounter (Signed)
DOS 08/05/2020  EXOSTECTOMY RT - 28108 HALLUX MPJ FUSION RT - 28750  [employer] MEDICARE EFFECTIVE DATE - 02/11/2020  PLAN DEDUCTIBLE - $0.00 OUT OF POCKET - $4500.00 W/ $4486.00 REMAINING COPAY $200.00 COINSURANCE - 100%  SPOKE TO CHOI AT [employer], HE STATED NO PRECERT REQUIRED FOR CPT [address] [place] [address]. CALL REF # [id number]

## 2020-07-28 ENCOUNTER — Encounter: Payer: Self-pay | Admitting: Podiatry

## 2020-07-29 ENCOUNTER — Telehealth: Payer: Self-pay

## 2020-07-29 NOTE — Telephone Encounter (Signed)
[name] [name] called to cancel his surgery on 08/05/2020 with Dr. Posey Pronto. He stated he wants to get a second opinion. I notified Caren Griffins with [facility] and Dr. Posey Pronto

## 2020-07-30 DIAGNOSIS — E11621 Type 2 diabetes mellitus with foot ulcer: Secondary | ICD-10-CM | POA: Insufficient documentation

## 2020-07-30 DIAGNOSIS — L97519 Non-pressure chronic ulcer of other part of right foot with unspecified severity: Secondary | ICD-10-CM | POA: Insufficient documentation

## 2020-07-30 NOTE — Progress Notes (Signed)
[facility] Note   HPI:  70 y.o. Male presents to clinic for evaluation of his right great toe ulcer. He had an injury to his toe after wearing poor fitting shoes to [place] this summer. The wound has been being cared for at the [facility] by Dr. Rocco Serene.   He also has been evaluated by Vascular, Dr. Donnetta Hutching and has good blood flow and no need for intervention.  He has been seen by Dr. Posey Pronto podiatry for possible surgery to off load pressure to his toe.   He says he does not always wear his diabetic shoes or insoles. He works and is on his feet a lot at a [employer].  He says he is doing the wound care with aquacel aq.    Review of Systems:  Minimal to no drainage Minor bleeding All other review of systems: otherwise negative   Vital Signs:  BP (!) 145/75   Pulse (!) 59   Temp (!) 96.9 F (36.1 C) (Other (Comment))   Resp 16   Ht 6\' 2"  (1.88 m)   Wt 233 lb (105.7 kg)   SpO2 95%   BMI 29.92 kg/m    Physical Exam:  Physical Exam Vitals reviewed.  Cardiovascular:     Rate and Rhythm: Normal rate.  Pulmonary:     Effort: Pulmonary effort is normal.  Musculoskeletal:        General: Normal range of motion.     Comments: Right great toe ulcer measuring 1.5X1.25cm, minimal drainage on dressing, no signs of infection, 2+ DP pulse   Skin:    General: Skin is warm.        Assessment:  69 y.o. yo Male with healing right great toe ulcer that is improving but did have a set back because of stumping his toe before Xmas. We called and talked about the wound with Dr. Rocco Serene. Currently Darrel and his wife Sunday Corn want to hold on podiatry surgery and I think this is reasonable. He is healing but slowly.  Dr. Rocco Serene feels like this is an ok plan to.   Plan:  - Patient asked for second podiatry referral to discuss the potential surgery type of surgery is not specific to myself or Dr. Apolonio Schneiders expertise, Referred to Dr. Caprice Beaver  - Continue Aquacel  Ag and continue with [place]. Would give it another month and see what progress is made.   All of the above recommendations were discussed with the patient and patient's family, and all of patient's and family's questions were answered to their expressed satisfaction.  Call with any other questions or concerns.   Curlene Labrum, MD [facility] [address] [place], [place] [address] [phone] (office)

## 2020-07-31 ENCOUNTER — Other Ambulatory Visit: Payer: Self-pay

## 2020-07-31 ENCOUNTER — Encounter: Payer: Self-pay | Admitting: Nurse Practitioner

## 2020-07-31 ENCOUNTER — Ambulatory Visit (INDEPENDENT_AMBULATORY_CARE_PROVIDER_SITE_OTHER): Payer: Medicare HMO | Admitting: Nurse Practitioner

## 2020-07-31 ENCOUNTER — Other Ambulatory Visit: Payer: Self-pay | Admitting: *Deleted

## 2020-07-31 VITALS — BP 159/76 | HR 58 | Ht 74.0 in | Wt 230.0 lb

## 2020-07-31 DIAGNOSIS — E1122 Type 2 diabetes mellitus with diabetic chronic kidney disease: Secondary | ICD-10-CM

## 2020-07-31 DIAGNOSIS — N1831 Chronic kidney disease, stage 3a: Secondary | ICD-10-CM

## 2020-07-31 DIAGNOSIS — I10 Essential (primary) hypertension: Secondary | ICD-10-CM

## 2020-07-31 DIAGNOSIS — E782 Mixed hyperlipidemia: Secondary | ICD-10-CM | POA: Diagnosis not present

## 2020-07-31 LAB — POCT GLYCOSYLATED HEMOGLOBIN (HGB A1C): Hemoglobin A1C: 7.4 % — AB (ref 4.0–5.6)

## 2020-07-31 MED ORDER — BLOOD GLUCOSE METER KIT: PACK | 0 refills | Status: DC

## 2020-07-31 MED ORDER — GLIPIZIDE ER 5 MG PO TB24: 5.0000 mg | ORAL_TABLET | Freq: Every day | ORAL | 3 refills | Status: DC

## 2020-07-31 MED ORDER — LOVASTATIN 20 MG PO TABS: 20.0000 mg | ORAL_TABLET | Freq: Every day | ORAL | 0 refills | Status: DC

## 2020-07-31 MED ORDER — METFORMIN HCL 500 MG PO TABS: 500.0000 mg | ORAL_TABLET | Freq: Every day | ORAL | 0 refills | Status: DC

## 2020-07-31 MED ORDER — ACCU-CHEK GUIDE VI STRP: ORAL_STRIP | 4 refills | Status: DC

## 2020-07-31 NOTE — Progress Notes (Signed)
07/31/2020        Endocrinology follow-up note     Subjective:    Patient ID: [name] [name], male    DOB: [birth year], PCP Rory Percy, MD   Past Medical History:  Diagnosis Date  . Anxiety   . 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 Arthritis   . Coronary artery disease    NSTEMI [id number]; multivessel CAD s/p post CABG (LIMA-LAD, SVG-D1, SVG-D2, SVG-OM)  . Essential hypertension   . GERD (gastroesophageal reflux disease)   . Gout   . History of kidney stones   . History of peptic ulcer disease   . Hyperlipidemia   . Left ureteral stone   . Nodule of right lung    CT 04-04-2017  right lower lobe multiple nodules  . OSA on CPAP   . Renal cyst, right    CT 04-04-2017  . Renal insufficiency   . Type 2 diabetes mellitus ([place])   . Wears glasses    Past Surgical History:  Procedure Laterality Date  . CARDIOVASCULAR STRESS TEST  11-13-2016   dr Domenic Polite   Intermediate risk nuclear study w/ medium defect of moderate severity in the basal inferoseptal, basal inferior, mid inferoseptal and mid inferior location (findings consistant w/ prior myocardial infarction)/  mild enlarged LV cavity size;  nuclear stress EF 45%  . COLONOSCOPY N/A 04/13/2017   Procedure: COLONOSCOPY;  Surgeon: Aviva Signs, MD;  [facility]: [facility];  Service: Gastroenterology;  Laterality: N/A;  . CORONARY ARTERY BYPASS GRAFT N/A 04/25/2013   Procedure: CORONARY ARTERY BYPASS GRAFTING (CABG);  Surgeon: [name], MD;  Location: [place];  Service: Open Heart Surgery;  Laterality: N/A;  Coronary artery bypass graft times four, on pump, using left internal mammary artery and right greater saphenous vein via endovein harvest.  . CYSTOSCOPY WITH RETROGRADE PYELOGRAM, URETEROSCOPY AND STENT PLACEMENT Left 04/08/2017   Procedure: CYSTOSCOPY WITH RETROGRADE PYELOGRAM, URETEROSCOPY,STENT PLACEMENT;  Surgeon: Lucas Mallow, MD;  Location: [facility];   Service: Urology;  Laterality: Left;  . CYSTOSCOPY/URETEROSCOPY/HOLMIUM LASER/STENT PLACEMENT Left 05/17/2017   Procedure: CYSTOSCOPY / RETROGRADE PYELOGRAM / URETEROSCOPY / HOLMIUM LASER / STONE BASKETRY / STENT EXCHANGE;  Surgeon: Lucas Mallow, MD;  Location: [place];  Service: Urology;  Laterality: Left;  ONLY NEEDS 45 MIN FOR PROCEDURE  . ELBOW BURSA SURGERY Right 03-10-2017     at [facility]   and removal of spur  . INTRAOPERATIVE TRANSESOPHAGEAL ECHOCARDIOGRAM N/A 04/25/2013   Procedure: INTRAOPERATIVE TRANSESOPHAGEAL ECHOCARDIOGRAM;  Surgeon: [name], MD;  Location: [facility] OR;  Service: Open Heart Surgery;  Laterality: N/A;  . KNEE ARTHROSCOPY Left 10/ 2017  approx.    dr Theda Sers  . LAPAROSCOPIC RIGHT HEMI COLECTOMY Right 04/19/2017   Procedure: OPEN RIGHT HEMI COLECTOMY;  Surgeon: Virl Cagey, MD;  Location: [facility];  Service: General;  Laterality: Right;  . LAPAROSCOPY N/A 04/19/2017   Procedure: LAPAROSCOPY DIAGNOSTIC;  Surgeon: Virl Cagey, MD;  Location: [facility];  Service: General;  Laterality: N/A;  patient knows to arrive at 6:15  . LEFT HEART CATHETERIZATION WITH CORONARY ANGIOGRAM N/A 04/20/2013   Procedure: LEFT HEART CATHETERIZATION WITH CORONARY ANGIOGRAM;  Surgeon: [name], MD;  Location: [facility] CATH LAB;  Service: Cardiovascular;  Laterality: N/A;  . NASAL SEPTUM SURGERY  1990s approx  . TONSILLECTOMY  22 (age 70)  . TRANSTHORACIC ECHOCARDIOGRAM  11-17-2016   dr Domenic Polite   mild  LVH,  ef 55-60%, LV wall motion & diastolic function indeterminant assessment,  images were inadequate/  mild LAE/  trivial Tr   Social History   Socioeconomic History  . Marital status: Married    Spouse name: Not on file  . Number of children: Not on file  . Years of education: Not on file  . Highest education level: Not on file  Occupational History  . Occupation: Drives Chief Operating Officer: [employer]  Tobacco Use  . Smoking status: Never Smoker  .  Smokeless tobacco: Never Used  Vaping Use  . Vaping Use: Never used  Substance and Sexual Activity  . Alcohol use: No    Alcohol/week: 0.0 standard drinks  . Drug use: No  . Sexual activity: Yes    Birth control/protection: None  Other Topics Concern  . Not on file  Social History Narrative   Lives with wife, does not exercise but part of his job is strenuous. Works in a body-shop part-time, too.   2 of his mother's sisters and 1 brother died suddenly (family was told MI but no autopsy).    Social Determinants of Health   Financial Resource Strain: Not on file  Food Insecurity: Not on file  Transportation Needs: Not on file  Physical Activity: Not on file  Stress: Not on file  Social Connections: Not on file   Outpatient Encounter Medications as of 07/31/2020  Medication Sig  . glipiZIDE (GLUCOTROL XL) 5 MG 24 hr tablet Take 1 tablet (5 mg total) by mouth daily with breakfast.  . glucose blood (ACCU-CHEK GUIDE) test strip Use as instructed to monitor glucose twice daily, before breakfast and before bed.  Marland Kitchen allopurinol (ZYLOPRIM) 100 MG tablet Take 100 mg by mouth daily.  . Ascorbic Acid (VITAMIN C) 1000 MG tablet Take 1,000 mg by mouth daily.  Marland Kitchen aspirin EC 325 MG EC tablet Take 1 tablet (325 mg total) by mouth daily. (Patient taking differently: Take 325 mg by mouth every morning.)  . colchicine 0.6 MG tablet Take 0.6 mg by mouth daily.   Marland Kitchen escitalopram (LEXAPRO) 10 MG tablet Take 10 mg by mouth at bedtime.   Marland Kitchen lisinopril (PRINIVIL,ZESTRIL) 10 MG tablet Take 5 mg by mouth every morning.   . metFORMIN (GLUCOPHAGE) 500 MG tablet Take 1 tablet (500 mg total) by mouth daily with breakfast.  . metoprolol tartrate (LOPRESSOR) 25 MG tablet Take 0.5 tablets (12.5 mg total) by mouth 2 (two) times daily.  . Multiple Vitamin (MULTIVITAMIN WITH MINERALS) TABS tablet Take 1 tablet by mouth daily.  . Omega-3 Fatty Acids (FISH OIL) 1000 MG CPDR Take 1 capsule by mouth 2 (two) times a day.   Marland Kitchen  omeprazole (PRILOSEC) 20 MG capsule Take 20 mg by mouth every morning.   . pyridoxine (B-6) 200 MG tablet Take 200 mg by mouth daily.  . [DISCONTINUED] lovastatin (MEVACOR) 20 MG tablet TAKE ONE TABLET BY MOUTH AT BEDTIME, STOP TAKING ATORVASTATIN  . [DISCONTINUED] metFORMIN (GLUCOPHAGE) 500 MG tablet Take 1 tablet (500 mg total) by mouth 2 (two) times daily.   No facility-administered encounter medications on file as of 07/31/2020.   ALLERGIES: No Known Allergies VACCINATION STATUS: Immunization History  Administered Date(s) Administered  . Influenza Split 02/10/2014  . Influenza,inj,Quad PF,6+ Mos 04/20/2017    Diabetes He presents for his follow-up diabetic visit. He has type 2 diabetes mellitus. His disease course has been worsening. There are no hypoglycemic associated symptoms. Pertinent negatives for hypoglycemia include no confusion,  headaches, pallor or seizures. Associated symptoms include foot ulcerations. Pertinent negatives for diabetes include no chest pain, no fatigue, no polydipsia, no polyphagia, no polyuria and no weakness. There are no hypoglycemic complications. Symptoms are improving. Diabetic complications include heart disease and nephropathy. Risk factors for coronary artery disease include diabetes mellitus, dyslipidemia, hypertension, male sex and sedentary lifestyle. Current diabetic treatment includes oral agent (monotherapy). He is compliant with treatment most of the time. His weight is fluctuating minimally. He is following a generally unhealthy diet. When asked about meal planning, he reported none. He has not had a previous visit with a dietitian. He never participates in exercise. There is no change in his home blood glucose trend. (He presents today with no meter or logs to review.  His POCT A1c today is 7.4%, worsening from last visit of 6.5%.  He does not routinely monitor glucose at home due to only medication for DM being Metformin.  He denies any s/s of  hypoglycemia.) An ACE inhibitor/angiotensin II receptor blocker is being taken. He does not see a podiatrist.Eye exam is current.  Hyperlipidemia This is a chronic problem. The current episode started more than 1 year ago. Exacerbating diseases include chronic renal disease and diabetes. Pertinent negatives include no chest pain, myalgias or shortness of breath. Current antihyperlipidemic treatment includes statins. Risk factors for coronary artery disease include diabetes mellitus, dyslipidemia, hypertension, male sex and a sedentary lifestyle.  Hypertension This is a chronic problem. The current episode started more than 1 year ago. The problem has been gradually improving since onset. The problem is uncontrolled. Pertinent negatives include no chest pain, headaches, neck pain, palpitations or shortness of breath. There are no associated agents to hypertension. Risk factors for coronary artery disease include dyslipidemia, diabetes mellitus, male gender and sedentary lifestyle. Past treatments include beta blockers and ACE inhibitors. The current treatment provides mild improvement. There are no compliance problems.  Hypertensive end-organ damage includes kidney disease and CAD/MI. Identifiable causes of hypertension include chronic renal disease.    Review of systems  Constitutional: + Minimally fluctuating body weight,  current Body mass index is 29.53 kg/m. , no fatigue, no subjective hyperthermia, no subjective hypothermia Eyes: no blurry vision, no xerophthalmia ENT: no sore throat, no nodules palpated in throat, no dysphagia/odynophagia, no hoarseness Cardiovascular: no chest pain, no shortness of breath, no palpitations, no leg swelling Respiratory: no cough, no shortness of breath Gastrointestinal: no nausea/vomiting/diarrhea Musculoskeletal: no muscle/joint aches Skin: no rashes, no hyperemia, still has healing pressure injury to toe (received DM shoes since last visit) Neurological: no  tremors, no numbness, no tingling, no dizziness Psychiatric: no depression, no anxiety  Objective:    BP (!) 159/76   Pulse (!) 58   Ht 6\' 2"  (1.88 m)   Wt 230 lb (104.3 kg)   BMI 29.53 kg/m   Wt Readings from Last 3 Encounters:  07/31/20 230 lb (104.3 kg)  07/25/20 233 lb (105.7 kg)  06/24/20 228 lb (103.4 kg)    BP Readings from Last 3 Encounters:  07/31/20 (!) 159/76  07/25/20 (!) 145/75  06/24/20 (!) 154/90    Physical Exam- Limited  Constitutional:  Body mass index is 29.53 kg/m. , not in acute distress, normal state of mind Eyes:  EOMI, no exophthalmos Neck: Supple Cardiovascular: RRR, no murmers, rubs, or gallops, no edema Respiratory: Adequate breathing efforts, no crackles, rales, rhonchi, or wheezing Musculoskeletal: no gross deformities, strength intact in all four extremities, no gross restriction of joint movements Skin:  no  rashes, no hyperemia Neurological: no tremor with outstretched hands   CMP     Component Value Date/Time   NA 141 07/24/2020 0932   K 4.5 07/24/2020 0932   CL 105 07/24/2020 0932   CO2 22 07/24/2020 0932   GLUCOSE 162 (H) 07/24/2020 0932   GLUCOSE 129 01/23/2020 1002   BUN 22 07/24/2020 0932   CREATININE 1.72 (H) 07/24/2020 0932   CREATININE 1.41 (H) 01/23/2020 1002   CALCIUM 10.0 07/24/2020 0932   PROT 6.3 07/24/2020 0932   ALBUMIN 4.4 07/24/2020 0932   AST 19 07/24/2020 0932   ALT 19 07/24/2020 0932   ALKPHOS 83 07/24/2020 0932   BILITOT 1.0 07/24/2020 0932   GFRNONAA 40 (L) 07/24/2020 0932   GFRNONAA 52 (L) 01/23/2020 1002   GFRAA 47 (L) 07/24/2020 0932   GFRAA 60 01/23/2020 1002     Diabetic Labs (most recent): Lab Results  Component Value Date   HGBA1C 7.4 (A) 07/31/2020   HGBA1C 6.5 (A) 01/31/2020   HGBA1C 6.6 (A) 09/13/2019     Lipid Panel ( most recent) Lipid Panel     Component Value Date/Time   CHOL 152 09/04/2019 0916   TRIG 172 (H) 09/04/2019 0916   HDL 38 (L) 09/04/2019 0916   CHOLHDL 4.0  09/04/2019 0916   VLDL 21 07/02/2016 0853   LDLCALC 87 09/04/2019 0916     Assessment & Plan:   1) Type 2 diabetes mellitus with other circulatory complications , and acute on chronic renal insufficiency   His diabetes is complicated by coronary artery disease status post coronary artery bypass graft, acute on chronic renal insufficiency and patient remains at a high risk for more acute and chronic complications of diabetes which include CAD, CVA, CKD, retinopathy, and neuropathy. These are all discussed in detail with the patient.  He presents today with no meter or logs to review.  His POCT A1c today is 7.4%, worsening from last visit of 6.5%.  He does not routinely monitor glucose at home due to only medication for DM being Metformin.  He denies any s/s of hypoglycemia.    - Recent labs reviewed.  - Nutritional counseling repeated at each appointment due to patients tendency to fall back in to old habits.  - The patient admits there is a room for improvement in their diet and drink choices. -  Suggestion is made for the patient to avoid simple carbohydrates from their diet including Cakes, Sweet Desserts / Pastries, Ice Cream, Soda (diet and regular), Sweet Tea, Candies, Chips, Cookies, Sweet Pastries,  Store Bought Juices, Alcohol in Excess of  1-2 drinks a day, Artificial Sweeteners, Coffee Creamer, and "Sugar-free" Products. This will help patient to have stable blood glucose profile and potentially avoid unintended weight gain.   - I encouraged the patient to switch to  unprocessed or minimally processed complex starch and increased protein intake (animal or plant source), fruits, and vegetables.   - Patient is advised to stick to a routine mealtimes to eat 3 meals  a day and avoid unnecessary snacks ( to snack only to correct hypoglycemia).  - I have approached patient with the following individualized plan to manage diabetes and patient agrees.  -Given his recent loss of control,  he will need additional intervention to regain proper control of his diabetes along with lifestyle modifications including less consumption of sweetened beverages, and processed carbohydrates.    -Given his steady decline in renal function, his Metformin will be reduced to 500 mg po  once daily with breakfast.  I discussed and initiated Glipizide 5 mg XL daily with breakfast. If he is still unable to reach target A1c on subsequent visits, will approach him to start basal insulin (which is something he wishes to avoid for now).  -He is approached to start monitoring blood glucose at least one time daily before breakfast and report if blood glucose readings are above 200 mg/dL 3 times a week.  Sample meter provided to him from office.  2) BP/HTN:  His blood pressure is not controlled to target.  He had not long taken his BP meds prior to this visit.  He is advised to continue Lisinopril 10 mg po daily and Metoprolol 12.5 mg po BID.  3) Lipids/HPL:  His most recent lipid panel from 09/04/19 shows controlled LDL of 87 and elevated but improved triglycerides of 172.  He is advised to continue Lovastatin 20 mg po daily at bedtime and fish oil supplement twice daily.  Encouraged him to avoid fried foods and butter.  4) Weight management:  His Body mass index is 29.53 kg/m.-a candidate for modest weight loss.  Detailed carbohydrates and exercise regimen was discussed with him.  5) Chronic Care/Health Maintenance: -Patient is on ACE and Statin medications and encouraged to continue to follow up with Ophthalmology, Podiatrist at least yearly or according to recommendations, and advised to  stay away from smoking. I have recommended yearly flu vaccine and pneumonia vaccination at least every 5 years; moderate intensity exercise for up to 150 minutes weekly; and  sleep for at least 7 hours a day.   - I advised patient to maintain close follow up with Rory Percy, MD for primary care needs.   - Time spent  on this patient care encounter:  35 min, of which > 50% was spent in  counseling and the rest reviewing his blood glucose logs , discussing his hypoglycemia and hyperglycemia episodes, reviewing his current and  previous labs / studies  ( including abstraction from other facilities) and medications  doses and developing a  long term treatment plan and documenting his care.   Please refer to Patient Instructions for Blood Glucose Monitoring and Insulin/Medications Dosing Guide"  in media tab for additional information. Please  also refer to " Patient Self Inventory" in the Media  tab for reviewed elements of pertinent patient history.  [name] [name] participated in the discussions, expressed understanding, and voiced agreement with the above plans.  All questions were answered to his satisfaction. he is encouraged to contact clinic should he have any questions or concerns prior to his return visit.    Follow up plan: -Return in about 4 months (around 11/28/2020) for Diabetes follow up- A1c and urine micro in office, Previsit labs, Bring glucometer and logs.    Rayetta Pigg, [facility] [address] [place], [place] [address] Phone: [phone] Fax: [phone]  07/31/2020, 9:17 AM

## 2020-07-31 NOTE — Patient Instructions (Signed)
Diabetes Mellitus and Nutrition, Adult When you have diabetes, or diabetes mellitus, it is very important to have healthy eating habits because your blood sugar (glucose) levels are greatly affected by what you eat and drink. Eating healthy foods in the right amounts, at about the same times every day, can help you:  Control your blood glucose.  Lower your risk of heart disease.  Improve your blood pressure.  Reach or maintain a healthy weight. What can affect my meal plan? Every person with diabetes is different, and each person has different needs for a meal plan. Your health care provider may recommend that you work with a dietitian to make a meal plan that is best for you. Your meal plan may vary depending on factors such as:  The calories you need.  The medicines you take.  Your weight.  Your blood glucose, blood pressure, and cholesterol levels.  Your activity level.  Other health conditions you have, such as heart or kidney disease. How do carbohydrates affect me? Carbohydrates, also called carbs, affect your blood glucose level more than any other type of food. Eating carbs naturally raises the amount of glucose in your blood. Carb counting is a method for keeping track of how many carbs you eat. Counting carbs is important to keep your blood glucose at a healthy level, especially if you use insulin or take certain oral diabetes medicines. It is important to know how many carbs you can safely have in each meal. This is different for every person. Your dietitian can help you calculate how many carbs you should have at each meal and for each snack. How does alcohol affect me? Alcohol can cause a sudden decrease in blood glucose (hypoglycemia), especially if you use insulin or take certain oral diabetes medicines. Hypoglycemia can be a life-threatening condition. Symptoms of hypoglycemia, such as sleepiness, dizziness, and confusion, are similar to symptoms of having too much  alcohol.  Do not drink alcohol if: ? Your health care provider tells you not to drink. ? You are pregnant, may be pregnant, or are planning to become pregnant.  If you drink alcohol: ? Do not drink on an empty stomach. ? Limit how much you use to:  0-1 drink a day for women.  0-2 drinks a day for men. ? Be aware of how much alcohol is in your drink. In the [place] one drink equals one 12 oz bottle of beer (355 mL), one 5 oz glass of wine (148 mL), or one 1 oz glass of hard liquor (44 mL). ? Keep yourself hydrated with water, diet soda, or unsweetened iced tea.  Keep in mind that regular soda, juice, and other mixers may contain a lot of sugar and must be counted as carbs. What are tips for following this plan? Reading food labels  Start by checking the serving size on the "Nutrition Facts" label of packaged foods and drinks. The amount of calories, carbs, fats, and other nutrients listed on the label is based on one serving of the item. Many items contain more than one serving per package.  Check the total grams (g) of carbs in one serving. You can calculate the number of servings of carbs in one serving by dividing the total carbs by 15. For example, if a food has 30 g of total carbs per serving, it would be equal to 2 servings of carbs.  Check the number of grams (g) of saturated fats and trans fats in one serving. Choose foods that have   a low amount or none of these fats.  Check the number of milligrams (mg) of salt (sodium) in one serving. Most people should limit total sodium intake to less than 2,300 mg per day.  Always check the nutrition information of foods labeled as "low-fat" or "nonfat." These foods may be higher in added sugar or refined carbs and should be avoided.  Talk to your dietitian to identify your daily goals for nutrients listed on the label. Shopping  Avoid buying canned, pre-made, or processed foods. These foods tend to be high in fat, sodium, and added  sugar.  Shop around the outside edge of the grocery store. This is where you will most often find fresh fruits and vegetables, bulk grains, fresh meats, and fresh dairy. Cooking  Use low-heat cooking methods, such as baking, instead of high-heat cooking methods like deep frying.  Cook using healthy oils, such as olive, canola, or sunflower oil.  Avoid cooking with butter, cream, or high-fat meats. Meal planning  Eat meals and snacks regularly, preferably at the same times every day. Avoid going long periods of time without eating.  Eat foods that are high in fiber, such as fresh fruits, vegetables, beans, and whole grains. Talk with your dietitian about how many servings of carbs you can eat at each meal.  Eat 4-6 oz (112-168 g) of lean protein each day, such as lean meat, chicken, fish, eggs, or tofu. One ounce (oz) of lean protein is equal to: ? 1 oz (28 g) of meat, chicken, or fish. ? 1 egg. ?  cup (62 g) of tofu.  Eat some foods each day that contain healthy fats, such as avocado, nuts, seeds, and fish.   What foods should I eat? Fruits Berries. Apples. Oranges. Peaches. Apricots. Plums. Grapes. Mango. Papaya. Pomegranate. Kiwi. Cherries. Vegetables Lettuce. Spinach. Leafy greens, including kale, chard, collard greens, and mustard greens. Beets. Cauliflower. Cabbage. Broccoli. Carrots. Green beans. Tomatoes. Peppers. Onions. Cucumbers. Brussels sprouts. Grains Whole grains, such as whole-wheat or whole-grain bread, crackers, tortillas, cereal, and pasta. Unsweetened oatmeal. Quinoa. Brown or wild rice. Meats and other proteins Seafood. Poultry without skin. Lean cuts of poultry and beef. Tofu. Nuts. Seeds. Dairy Low-fat or fat-free dairy products such as milk, yogurt, and cheese. The items listed above may not be a complete list of foods and beverages you can eat. Contact a dietitian for more information. What foods should I avoid? Fruits Fruits canned with  syrup. Vegetables Canned vegetables. Frozen vegetables with butter or cream sauce. Grains Refined white flour and flour products such as bread, pasta, snack foods, and cereals. Avoid all processed foods. Meats and other proteins Fatty cuts of meat. Poultry with skin. Breaded or fried meats. Processed meat. Avoid saturated fats. Dairy Full-fat yogurt, cheese, or milk. Beverages Sweetened drinks, such as soda or iced tea. The items listed above may not be a complete list of foods and beverages you should avoid. Contact a dietitian for more information. Questions to ask a health care provider  Do I need to meet with a diabetes educator?  Do I need to meet with a dietitian?  What number can I call if I have questions?  When are the best times to check my blood glucose? Where to find more information:  American Diabetes Association: diabetes.org  Academy of Nutrition and Dietetics: www.eatright.org  National Institute of Diabetes and Digestive and Kidney Diseases: www.niddk.nih.gov  Association of Diabetes Care and Education Specialists: www.diabeteseducator.org Summary  It is important to have healthy eating   habits because your blood sugar (glucose) levels are greatly affected by what you eat and drink.  A healthy meal plan will help you control your blood glucose and maintain a healthy lifestyle.  Your health care provider may recommend that you work with a dietitian to make a meal plan that is best for you.  Keep in mind that carbohydrates (carbs) and alcohol have immediate effects on your blood glucose levels. It is important to count carbs and to use alcohol carefully. This information is not intended to replace advice given to you by your health care provider. Make sure you discuss any questions you have with your health care provider. Document Revised: 06/06/2019 Document Reviewed: 06/06/2019 Elsevier Patient Education  2021 Elsevier Inc.  

## 2020-08-02 ENCOUNTER — Other Ambulatory Visit: Payer: Self-pay

## 2020-08-02 ENCOUNTER — Other Ambulatory Visit: Payer: Self-pay | Admitting: Cardiology

## 2020-08-02 DIAGNOSIS — N1831 Chronic kidney disease, stage 3a: Secondary | ICD-10-CM

## 2020-08-02 MED ORDER — BLOOD GLUCOSE METER KIT: PACK | 0 refills | Status: DC

## 2020-08-02 MED ORDER — GLUCOSE BLOOD VI STRP: ORAL_STRIP | 12 refills | Status: DC

## 2020-08-05 ENCOUNTER — Ambulatory Visit: Payer: Medicare PPO | Admitting: "Endocrinology

## 2020-08-05 NOTE — Telephone Encounter (Signed)
Myriam Jacobson with [facility] needs a call back with some clarification on this. They are unable to reach the patient. Please call at [phone] and reference number [id number]

## 2020-08-07 ENCOUNTER — Telehealth: Payer: Self-pay | Admitting: Nurse Practitioner

## 2020-08-07 DIAGNOSIS — Z7982 Long term (current) use of aspirin: Secondary | ICD-10-CM | POA: Diagnosis not present

## 2020-08-07 DIAGNOSIS — R21 Rash and other nonspecific skin eruption: Secondary | ICD-10-CM | POA: Diagnosis not present

## 2020-08-07 DIAGNOSIS — E1140 Type 2 diabetes mellitus with diabetic neuropathy, unspecified: Secondary | ICD-10-CM | POA: Diagnosis not present

## 2020-08-07 DIAGNOSIS — E11621 Type 2 diabetes mellitus with foot ulcer: Secondary | ICD-10-CM | POA: Diagnosis not present

## 2020-08-07 DIAGNOSIS — I10 Essential (primary) hypertension: Secondary | ICD-10-CM | POA: Diagnosis not present

## 2020-08-07 DIAGNOSIS — Z7984 Long term (current) use of oral hypoglycemic drugs: Secondary | ICD-10-CM | POA: Diagnosis not present

## 2020-08-07 DIAGNOSIS — Z79899 Other long term (current) drug therapy: Secondary | ICD-10-CM | POA: Diagnosis not present

## 2020-08-07 DIAGNOSIS — L97519 Non-pressure chronic ulcer of other part of right foot with unspecified severity: Secondary | ICD-10-CM | POA: Diagnosis not present

## 2020-08-07 DIAGNOSIS — Z951 Presence of aortocoronary bypass graft: Secondary | ICD-10-CM | POA: Diagnosis not present

## 2020-08-07 DIAGNOSIS — L97512 Non-pressure chronic ulcer of other part of right foot with fat layer exposed: Secondary | ICD-10-CM | POA: Diagnosis not present

## 2020-08-07 NOTE — Telephone Encounter (Signed)
[facility] is calling and states that the test strips that were called In is not covered and requesting a call back  [phone] opt 2 ref # [id number]

## 2020-08-07 NOTE — Telephone Encounter (Signed)
Talked with [facility] representative, he stated they did receive Rx for the one touch verio test strips and they are covered.

## 2020-08-10 DIAGNOSIS — E119 Type 2 diabetes mellitus without complications: Secondary | ICD-10-CM | POA: Diagnosis not present

## 2020-08-10 DIAGNOSIS — I10 Essential (primary) hypertension: Secondary | ICD-10-CM | POA: Diagnosis not present

## 2020-08-10 DIAGNOSIS — E782 Mixed hyperlipidemia: Secondary | ICD-10-CM | POA: Diagnosis not present

## 2020-08-14 ENCOUNTER — Encounter: Payer: Medicare HMO | Admitting: Podiatry

## 2020-08-26 NOTE — Progress Notes (Unsigned)
Cardiology Office Note  Date: 08/27/2020   ID: [name] [name], DOB 1950/08/31, MRN [id number]  PCP:  Lavella Lemons, PA  Cardiologist:  [name], MD Electrophysiologist:  None   Chief Complaint  Patient presents with  . Cardiac follow-up    History of Present Illness: [name] [name] is a 70 y.o. male last seen in July 2020.  He presents for a routine follow-up visit.  From a cardiac perspective, he does not describe any active angina symptoms on medical therapy.  Still working in a body shop.  He is following at the [facility] wound clinic for treatment of a diabetic ulcer on his right great toe.  States that this is improving.  ABIs were not diagnostic due to noncompressible vessels and calcification.  Waveforms were biphasic bilaterally.  I reviewed his medications which are outlined below.  We discussed reducing aspirin to 81 mg daily.  I personally reviewed his ECG which shows sinus rhythm with prolonged PR interval, old inferior infarct pattern, low voltage in the precordial leads.  He continues to follow with endocrinology, I reviewed his most recent lab work.  He has changed his diet and working on weight loss, has lost about 10 pounds.   Past Medical History:  Diagnosis Date  . Anxiety   . 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 Arthritis   . Coronary artery disease    NSTEMI [id number]; multivessel CAD s/p post CABG (LIMA-LAD, SVG-D1, SVG-D2, SVG-OM)  . Essential hypertension   . GERD (gastroesophageal reflux disease)   . Gout   . History of kidney stones   . History of peptic ulcer disease   . Hyperlipidemia   . Left ureteral stone   . Nodule of right lung    CT 04-04-2017  right lower lobe multiple nodules  . OSA on CPAP   . Renal cyst, right    CT 04-04-2017  . Renal insufficiency   . Type 2 diabetes mellitus ([place])   . Wears glasses     Past Surgical History:  Procedure Laterality Date  . CARDIOVASCULAR STRESS  TEST  11-13-2016   dr Domenic Polite   Intermediate risk nuclear study w/ medium defect of moderate severity in the basal inferoseptal, basal inferior, mid inferoseptal and mid inferior location (findings consistant w/ prior myocardial infarction)/  mild enlarged LV cavity size;  nuclear stress EF 45%  . COLONOSCOPY N/A 04/13/2017   Procedure: COLONOSCOPY;  Surgeon: Aviva Signs, MD;  [facility]: [facility];  Service: Gastroenterology;  Laterality: N/A;  . CORONARY ARTERY BYPASS GRAFT N/A 04/25/2013   Procedure: CORONARY ARTERY BYPASS GRAFTING (CABG);  Surgeon: [name], MD;  Location: [place];  Service: Open Heart Surgery;  Laterality: N/A;  Coronary artery bypass graft times four, on pump, using left internal mammary artery and right greater saphenous vein via endovein harvest.  . CYSTOSCOPY WITH RETROGRADE PYELOGRAM, URETEROSCOPY AND STENT PLACEMENT Left 04/08/2017   Procedure: CYSTOSCOPY WITH RETROGRADE PYELOGRAM, URETEROSCOPY,STENT PLACEMENT;  Surgeon: Lucas Mallow, MD;  Location: [facility];  Service: Urology;  Laterality: Left;  . CYSTOSCOPY/URETEROSCOPY/HOLMIUM LASER/STENT PLACEMENT Left 05/17/2017   Procedure: CYSTOSCOPY / RETROGRADE PYELOGRAM / URETEROSCOPY / HOLMIUM LASER / STONE BASKETRY / STENT EXCHANGE;  Surgeon: Lucas Mallow, MD;  Location: [place];  Service: Urology;  Laterality: Left;  ONLY NEEDS 45 MIN FOR PROCEDURE  . ELBOW BURSA SURGERY Right 03-10-2017     at [facility]  and removal of spur  . INTRAOPERATIVE TRANSESOPHAGEAL ECHOCARDIOGRAM N/A 04/25/2013   Procedure: INTRAOPERATIVE TRANSESOPHAGEAL ECHOCARDIOGRAM;  Surgeon: Alleen Borne, MD;  Location: [facility] OR;  Service: Open Heart Surgery;  Laterality: N/A;  . KNEE ARTHROSCOPY Left 10/ 2017  approx.    dr Thomasena Edis  . LAPAROSCOPIC RIGHT HEMI COLECTOMY Right 04/19/2017   Procedure: OPEN RIGHT HEMI COLECTOMY;  Surgeon: Lucretia Roers, MD;  Location: [facility];  Service: General;  Laterality:  Right;  . LAPAROSCOPY N/A 04/19/2017   Procedure: LAPAROSCOPY DIAGNOSTIC;  Surgeon: Lucretia Roers, MD;  Location: [facility];  Service: General;  Laterality: N/A;  patient knows to arrive at 6:15  . LEFT HEART CATHETERIZATION WITH CORONARY ANGIOGRAM N/A 04/20/2013   Procedure: LEFT HEART CATHETERIZATION WITH CORONARY ANGIOGRAM;  Surgeon: Micheline Chapman, MD;  Location: [facility] CATH LAB;  Service: Cardiovascular;  Laterality: N/A;  . NASAL SEPTUM SURGERY  1990s approx  . TONSILLECTOMY  30 (age 30)  . TRANSTHORACIC ECHOCARDIOGRAM  11-17-2016   dr Diona Browner   mild LVH,  ef 55-60%, LV wall motion & diastolic function indeterminant assessment,  images were inadequate/  mild LAE/  trivial Tr    Current Outpatient Medications  Medication Sig Dispense Refill  . allopurinol (ZYLOPRIM) 100 MG tablet Take 100 mg by mouth daily.    . Ascorbic Acid (VITAMIN C) 1000 MG tablet Take 1,000 mg by mouth daily.    Marland Kitchen aspirin EC 81 MG tablet Take 1 tablet (81 mg total) by mouth daily. Swallow whole. 90 tablet 3  . blood glucose meter kit and supplies Dispense based on patient and insurance preference. Use one time daily as directed. (FOR ICD-10 E10.9, E11.9).ONE TOUCH 1 each 0  . colchicine 0.6 MG tablet Take 0.6 mg by mouth daily.     Marland Kitchen escitalopram (LEXAPRO) 10 MG tablet Take 10 mg by mouth at bedtime.     Marland Kitchen glipiZIDE (GLUCOTROL XL) 5 MG 24 hr tablet Take 1 tablet (5 mg total) by mouth daily with breakfast. 30 tablet 3  . glucose blood test strip Use as instructed TO TEST BLOOD GLUCOSE ONCE DAILY, DX E11.9 100 each 12  . lisinopril (PRINIVIL,ZESTRIL) 10 MG tablet Take 5 mg by mouth every morning.     . lovastatin (MEVACOR) 20 MG tablet TAKE ONE TABLET BY MOUTH AT BEDTIME, STOP TAKING ATORVASTATIN 30 tablet 0  . metFORMIN (GLUCOPHAGE) 500 MG tablet Take 1 tablet (500 mg total) by mouth daily with breakfast. 180 tablet 0  . metoprolol tartrate (LOPRESSOR) 25 MG tablet Take 0.5 tablets (12.5 mg total) by mouth 2 (two)  times daily. 90 tablet 1  . Multiple Vitamin (MULTIVITAMIN WITH MINERALS) TABS tablet Take 1 tablet by mouth daily.    . Omega-3 Fatty Acids (FISH OIL) 1000 MG CPDR Take 1 capsule by mouth 2 (two) times a day.     Marland Kitchen omeprazole (PRILOSEC) 20 MG capsule Take 20 mg by mouth every morning.     . pyridoxine (B-6) 200 MG tablet Take 200 mg by mouth daily.     No current facility-administered medications for this visit.   Allergies:  Patient has no known allergies.   ROS: No palpitations or syncope.  Physical Exam: VS:  BP 118/64   Pulse 60   Ht 6\' 2"  (1.88 m)   Wt 224 lb (101.6 kg)   SpO2 95%   BMI 28.76 kg/m , BMI Body mass index is 28.76 kg/m.  Wt Readings from Last 3 Encounters:  08/27/20 224  lb (101.6 kg)  07/31/20 230 lb (104.3 kg)  07/25/20 233 lb (105.7 kg)    General: Patient appears comfortable at rest. HEENT: Conjunctiva and lids normal, wearing a mask. Neck: Supple, no elevated JVP or carotid bruits, no thyromegaly. Lungs: Clear to auscultation, nonlabored breathing at rest. Cardiac: Regular rate and rhythm, no S3 or significant systolic murmur. Abdomen: Soft, nontender, bowel sounds present. Extremities: No pitting edema.  ECG:  An ECG dated 01/31/2019 was personally reviewed today and demonstrated:  Sinus bradycardia with prolonged PR interval, low voltage, nonspecific ST-T changes.  Recent Labwork: 07/24/2020: ALT 19; AST 19; BUN 22; Creatinine, Ser 1.72; Potassium 4.5; Sodium 141     Component Value Date/Time   CHOL 152 09/04/2019 0916   TRIG 172 (H) 09/04/2019 0916   HDL 38 (L) 09/04/2019 0916   CHOLHDL 4.0 09/04/2019 0916   VLDL 21 07/02/2016 0853   LDLCALC 87 09/04/2019 0916    Other Studies Reviewed Today:  [facility] Myoview 11/13/2016:  There was no ST segment deviation noted during stress.  Defect 1: There is a medium defect of moderate severity present in the basal inferoseptal, basal inferior, mid inferoseptal and mid inferior location.  Findings  consistent with prior myocardial infarction.  This is an intermediate risk study.  Nuclear stress EF: 45%.  Echocardiogram 11/17/2016: Study Conclusions  - Left ventricle: The cavity size was normal. Wall thickness was increased in a pattern of mild LVH. Systolic function was normal. The estimated ejection fraction was in the range of 55% to 60%. - Aortic valve: Mildly calcified annulus. Mildly thickened, mildly calcified leaflets. Valve area (VTI): 3.22 cm^2. Valve area (Vmax): 3.3 cm^2. - Left atrium: The atrium was mildly dilated. - Technically adequate study.  Assessment and Plan:  1.  Multivessel CAD status post CABG in 2014.  He denies any active angina at this time on medical therapy, ECG reviewed and stable.  Last ischemic testing was in 2018 as noted above.  Continue aspirin at 81 mg daily, lisinopril, lovastatin, and Lopressor.  2.  OSA on CPAP.  3.  Mixed hyperlipidemia, he continues on Mevacor.  Last LDL 87.  4.  Type 2 diabetes mellitus, continues to follow with endocrinology.  Last hemoglobin A1c 7.4%.  He is working on diet and weight loss.  Medication Adjustments/Labs and Tests Ordered: Current medicines are reviewed at length with the patient today.  Concerns regarding medicines are outlined above.   Tests Ordered: Orders Placed This Encounter  Procedures  . EKG 12-Lead    Medication Changes: Meds ordered this encounter  Medications  . aspirin EC 81 MG tablet    Sig: Take 1 tablet (81 mg total) by mouth daily. Swallow whole.    Dispense:  90 tablet    Refill:  3    08/27/2020 dose decrease    Disposition:  Follow up 1 year in the [place] office.  Signed, [name], MD, [name] 08/27/2020 9:03 AM    [facility] [place], [place], [place] [address] Phone: [phone]; Fax: [phone]

## 2020-08-27 ENCOUNTER — Ambulatory Visit: Payer: Medicare HMO | Admitting: Cardiology

## 2020-08-27 ENCOUNTER — Encounter: Payer: Self-pay | Admitting: Cardiology

## 2020-08-27 VITALS — BP 118/64 | HR 60 | Ht 74.0 in | Wt 224.0 lb

## 2020-08-27 DIAGNOSIS — G4733 Obstructive sleep apnea (adult) (pediatric): Secondary | ICD-10-CM

## 2020-08-27 DIAGNOSIS — I25119 Atherosclerotic heart disease of native coronary artery with unspecified angina pectoris: Secondary | ICD-10-CM | POA: Diagnosis not present

## 2020-08-27 DIAGNOSIS — E782 Mixed hyperlipidemia: Secondary | ICD-10-CM

## 2020-08-27 DIAGNOSIS — Z9989 Dependence on other enabling machines and devices: Secondary | ICD-10-CM

## 2020-08-27 MED ORDER — ASPIRIN EC 81 MG PO TBEC: 81.0000 mg | DELAYED_RELEASE_TABLET | Freq: Every day | ORAL | 3 refills | Status: AC

## 2020-08-27 NOTE — Patient Instructions (Addendum)
Medication Instructions:    Your physician has recommended you make the following change in your medication:   Decrease aspirin to 81 mg by mouth daily  Continue other medications the same  Labwork:  none  Testing/Procedures:  none  Follow-Up:  Your physician recommends that you schedule a follow-up appointment in: 1 year. You will receive a reminder letter in the mail in about 10 months reminding you to call and schedule your appointment. If you don't receive this letter, please contact our office.  Any Other Special Instructions Will Be Listed Below (If Applicable).  If you need a refill on your cardiac medications before your next appointment, please call your pharmacy.

## 2020-08-28 ENCOUNTER — Encounter: Payer: Medicare HMO | Admitting: Podiatry

## 2020-08-28 DIAGNOSIS — Z7984 Long term (current) use of oral hypoglycemic drugs: Secondary | ICD-10-CM | POA: Diagnosis not present

## 2020-08-28 DIAGNOSIS — Z79899 Other long term (current) drug therapy: Secondary | ICD-10-CM | POA: Diagnosis not present

## 2020-08-28 DIAGNOSIS — R21 Rash and other nonspecific skin eruption: Secondary | ICD-10-CM | POA: Diagnosis not present

## 2020-08-28 DIAGNOSIS — L97519 Non-pressure chronic ulcer of other part of right foot with unspecified severity: Secondary | ICD-10-CM | POA: Diagnosis not present

## 2020-08-28 DIAGNOSIS — E11621 Type 2 diabetes mellitus with foot ulcer: Secondary | ICD-10-CM | POA: Diagnosis not present

## 2020-08-28 DIAGNOSIS — E1140 Type 2 diabetes mellitus with diabetic neuropathy, unspecified: Secondary | ICD-10-CM | POA: Diagnosis not present

## 2020-08-28 DIAGNOSIS — Z7982 Long term (current) use of aspirin: Secondary | ICD-10-CM | POA: Diagnosis not present

## 2020-08-28 DIAGNOSIS — Z951 Presence of aortocoronary bypass graft: Secondary | ICD-10-CM | POA: Diagnosis not present

## 2020-08-28 DIAGNOSIS — I10 Essential (primary) hypertension: Secondary | ICD-10-CM | POA: Diagnosis not present

## 2020-08-28 DIAGNOSIS — L97512 Non-pressure chronic ulcer of other part of right foot with fat layer exposed: Secondary | ICD-10-CM | POA: Diagnosis not present

## 2020-08-30 DIAGNOSIS — L603 Nail dystrophy: Secondary | ICD-10-CM | POA: Diagnosis not present

## 2020-08-30 DIAGNOSIS — L97512 Non-pressure chronic ulcer of other part of right foot with fat layer exposed: Secondary | ICD-10-CM | POA: Diagnosis not present

## 2020-08-30 DIAGNOSIS — E1142 Type 2 diabetes mellitus with diabetic polyneuropathy: Secondary | ICD-10-CM | POA: Diagnosis not present

## 2020-09-02 DIAGNOSIS — Z6829 Body mass index (BMI) 29.0-29.9, adult: Secondary | ICD-10-CM | POA: Diagnosis not present

## 2020-09-02 DIAGNOSIS — I10 Essential (primary) hypertension: Secondary | ICD-10-CM | POA: Diagnosis not present

## 2020-09-02 DIAGNOSIS — D373 Neoplasm of uncertain behavior of appendix: Secondary | ICD-10-CM | POA: Diagnosis not present

## 2020-09-02 DIAGNOSIS — I25119 Atherosclerotic heart disease of native coronary artery with unspecified angina pectoris: Secondary | ICD-10-CM | POA: Diagnosis not present

## 2020-09-02 DIAGNOSIS — E7849 Other hyperlipidemia: Secondary | ICD-10-CM | POA: Diagnosis not present

## 2020-09-02 DIAGNOSIS — E119 Type 2 diabetes mellitus without complications: Secondary | ICD-10-CM | POA: Diagnosis not present

## 2020-09-06 ENCOUNTER — Other Ambulatory Visit: Payer: Self-pay | Admitting: Cardiology

## 2020-09-09 DIAGNOSIS — I25119 Atherosclerotic heart disease of native coronary artery with unspecified angina pectoris: Secondary | ICD-10-CM | POA: Diagnosis not present

## 2020-09-09 DIAGNOSIS — E119 Type 2 diabetes mellitus without complications: Secondary | ICD-10-CM | POA: Diagnosis not present

## 2020-09-09 DIAGNOSIS — I10 Essential (primary) hypertension: Secondary | ICD-10-CM | POA: Diagnosis not present

## 2020-09-09 DIAGNOSIS — D373 Neoplasm of uncertain behavior of appendix: Secondary | ICD-10-CM | POA: Diagnosis not present

## 2020-10-07 ENCOUNTER — Other Ambulatory Visit: Payer: Self-pay | Admitting: Nurse Practitioner

## 2020-10-07 ENCOUNTER — Other Ambulatory Visit: Payer: Self-pay | Admitting: Cardiology

## 2020-10-07 DIAGNOSIS — N1831 Chronic kidney disease, stage 3a: Secondary | ICD-10-CM

## 2020-10-09 DIAGNOSIS — I25119 Atherosclerotic heart disease of native coronary artery with unspecified angina pectoris: Secondary | ICD-10-CM | POA: Diagnosis not present

## 2020-10-09 DIAGNOSIS — L97519 Non-pressure chronic ulcer of other part of right foot with unspecified severity: Secondary | ICD-10-CM | POA: Diagnosis not present

## 2020-10-09 DIAGNOSIS — S90421A Blister (nonthermal), right great toe, initial encounter: Secondary | ICD-10-CM | POA: Diagnosis not present

## 2020-10-09 DIAGNOSIS — Z7984 Long term (current) use of oral hypoglycemic drugs: Secondary | ICD-10-CM | POA: Diagnosis not present

## 2020-10-09 DIAGNOSIS — I10 Essential (primary) hypertension: Secondary | ICD-10-CM | POA: Diagnosis not present

## 2020-10-09 DIAGNOSIS — E11621 Type 2 diabetes mellitus with foot ulcer: Secondary | ICD-10-CM | POA: Diagnosis not present

## 2020-10-09 DIAGNOSIS — E1140 Type 2 diabetes mellitus with diabetic neuropathy, unspecified: Secondary | ICD-10-CM | POA: Diagnosis not present

## 2020-10-09 DIAGNOSIS — Z7982 Long term (current) use of aspirin: Secondary | ICD-10-CM | POA: Diagnosis not present

## 2020-10-09 DIAGNOSIS — E782 Mixed hyperlipidemia: Secondary | ICD-10-CM | POA: Diagnosis not present

## 2020-10-09 DIAGNOSIS — E119 Type 2 diabetes mellitus without complications: Secondary | ICD-10-CM | POA: Diagnosis not present

## 2020-10-09 DIAGNOSIS — L97512 Non-pressure chronic ulcer of other part of right foot with fat layer exposed: Secondary | ICD-10-CM | POA: Diagnosis not present

## 2020-10-09 DIAGNOSIS — R21 Rash and other nonspecific skin eruption: Secondary | ICD-10-CM | POA: Diagnosis not present

## 2020-10-09 DIAGNOSIS — Z951 Presence of aortocoronary bypass graft: Secondary | ICD-10-CM | POA: Diagnosis not present

## 2020-10-16 DIAGNOSIS — L97512 Non-pressure chronic ulcer of other part of right foot with fat layer exposed: Secondary | ICD-10-CM | POA: Diagnosis not present

## 2020-10-16 DIAGNOSIS — Z951 Presence of aortocoronary bypass graft: Secondary | ICD-10-CM | POA: Diagnosis not present

## 2020-10-16 DIAGNOSIS — E1140 Type 2 diabetes mellitus with diabetic neuropathy, unspecified: Secondary | ICD-10-CM | POA: Diagnosis not present

## 2020-10-16 DIAGNOSIS — Z7982 Long term (current) use of aspirin: Secondary | ICD-10-CM | POA: Diagnosis not present

## 2020-10-16 DIAGNOSIS — R21 Rash and other nonspecific skin eruption: Secondary | ICD-10-CM | POA: Diagnosis not present

## 2020-10-16 DIAGNOSIS — I10 Essential (primary) hypertension: Secondary | ICD-10-CM | POA: Diagnosis not present

## 2020-10-16 DIAGNOSIS — L97519 Non-pressure chronic ulcer of other part of right foot with unspecified severity: Secondary | ICD-10-CM | POA: Diagnosis not present

## 2020-10-16 DIAGNOSIS — Z7984 Long term (current) use of oral hypoglycemic drugs: Secondary | ICD-10-CM | POA: Diagnosis not present

## 2020-10-16 DIAGNOSIS — E11621 Type 2 diabetes mellitus with foot ulcer: Secondary | ICD-10-CM | POA: Diagnosis not present

## 2020-10-28 ENCOUNTER — Other Ambulatory Visit: Payer: Self-pay | Admitting: Nurse Practitioner

## 2020-10-28 DIAGNOSIS — N1831 Chronic kidney disease, stage 3a: Secondary | ICD-10-CM

## 2020-10-28 DIAGNOSIS — E1122 Type 2 diabetes mellitus with diabetic chronic kidney disease: Secondary | ICD-10-CM

## 2020-11-06 DIAGNOSIS — E11621 Type 2 diabetes mellitus with foot ulcer: Secondary | ICD-10-CM | POA: Diagnosis not present

## 2020-11-06 DIAGNOSIS — L97512 Non-pressure chronic ulcer of other part of right foot with fat layer exposed: Secondary | ICD-10-CM | POA: Diagnosis not present

## 2020-11-06 DIAGNOSIS — Z7984 Long term (current) use of oral hypoglycemic drugs: Secondary | ICD-10-CM | POA: Diagnosis not present

## 2020-11-06 DIAGNOSIS — Z7982 Long term (current) use of aspirin: Secondary | ICD-10-CM | POA: Diagnosis not present

## 2020-11-06 DIAGNOSIS — I10 Essential (primary) hypertension: Secondary | ICD-10-CM | POA: Diagnosis not present

## 2020-11-06 DIAGNOSIS — Z79899 Other long term (current) drug therapy: Secondary | ICD-10-CM | POA: Diagnosis not present

## 2020-11-06 DIAGNOSIS — E1140 Type 2 diabetes mellitus with diabetic neuropathy, unspecified: Secondary | ICD-10-CM | POA: Diagnosis not present

## 2020-11-06 DIAGNOSIS — E785 Hyperlipidemia, unspecified: Secondary | ICD-10-CM | POA: Diagnosis not present

## 2020-11-06 DIAGNOSIS — L97519 Non-pressure chronic ulcer of other part of right foot with unspecified severity: Secondary | ICD-10-CM | POA: Diagnosis not present

## 2020-11-12 DIAGNOSIS — E1142 Type 2 diabetes mellitus with diabetic polyneuropathy: Secondary | ICD-10-CM | POA: Diagnosis not present

## 2020-11-12 DIAGNOSIS — L603 Nail dystrophy: Secondary | ICD-10-CM | POA: Diagnosis not present

## 2020-11-20 DIAGNOSIS — E1122 Type 2 diabetes mellitus with diabetic chronic kidney disease: Secondary | ICD-10-CM | POA: Diagnosis not present

## 2020-11-20 DIAGNOSIS — N1831 Chronic kidney disease, stage 3a: Secondary | ICD-10-CM | POA: Diagnosis not present

## 2020-11-21 LAB — COMPREHENSIVE METABOLIC PANEL
ALT: 20 IU/L (ref 0–44)
AST: 24 IU/L (ref 0–40)
Albumin/Globulin Ratio: 2 (ref 1.2–2.2)
Albumin: 4.4 g/dL (ref 3.8–4.8)
Alkaline Phosphatase: 88 IU/L (ref 44–121)
BUN/Creatinine Ratio: 11 (ref 10–24)
BUN: 16 mg/dL (ref 8–27)
Bilirubin Total: 0.9 mg/dL (ref 0.0–1.2)
CO2: 23 mmol/L (ref 20–29)
Calcium: 9.6 mg/dL (ref 8.6–10.2)
Chloride: 104 mmol/L (ref 96–106)
Creatinine, Ser: 1.43 mg/dL — ABNORMAL HIGH (ref 0.76–1.27)
Globulin, Total: 2.2 g/dL (ref 1.5–4.5)
Glucose: 142 mg/dL — ABNORMAL HIGH (ref 65–99)
Potassium: 4.5 mmol/L (ref 3.5–5.2)
Sodium: 142 mmol/L (ref 134–144)
Total Protein: 6.6 g/dL (ref 6.0–8.5)
eGFR: 54 mL/min/{1.73_m2} — ABNORMAL LOW (ref >59)

## 2020-11-21 LAB — LIPID PANEL
Chol/HDL Ratio: 3.7 ratio (ref 0.0–5.0)
Cholesterol, Total: 161 mg/dL (ref 100–199)
HDL: 43 mg/dL (ref >39)
LDL Chol Calc (NIH): 86 mg/dL (ref 0–99)
Triglycerides: 191 mg/dL — ABNORMAL HIGH (ref 0–149)
VLDL Cholesterol Cal: 32 mg/dL (ref 5–40)

## 2020-11-28 ENCOUNTER — Ambulatory Visit: Payer: Medicare HMO | Admitting: Nurse Practitioner

## 2020-11-28 DIAGNOSIS — L03115 Cellulitis of right lower limb: Secondary | ICD-10-CM | POA: Diagnosis not present

## 2020-11-28 DIAGNOSIS — M109 Gout, unspecified: Secondary | ICD-10-CM | POA: Diagnosis not present

## 2020-11-28 DIAGNOSIS — Z6829 Body mass index (BMI) 29.0-29.9, adult: Secondary | ICD-10-CM | POA: Diagnosis not present

## 2020-11-28 NOTE — Patient Instructions (Incomplete)

## 2020-12-04 DIAGNOSIS — I10 Essential (primary) hypertension: Secondary | ICD-10-CM | POA: Diagnosis not present

## 2020-12-04 DIAGNOSIS — I252 Old myocardial infarction: Secondary | ICD-10-CM | POA: Diagnosis not present

## 2020-12-04 DIAGNOSIS — X58XXXA Exposure to other specified factors, initial encounter: Secondary | ICD-10-CM | POA: Diagnosis not present

## 2020-12-04 DIAGNOSIS — Z4800 Encounter for change or removal of nonsurgical wound dressing: Secondary | ICD-10-CM | POA: Diagnosis not present

## 2020-12-04 DIAGNOSIS — S90421A Blister (nonthermal), right great toe, initial encounter: Secondary | ICD-10-CM | POA: Diagnosis not present

## 2020-12-04 DIAGNOSIS — Z7982 Long term (current) use of aspirin: Secondary | ICD-10-CM | POA: Diagnosis not present

## 2020-12-04 DIAGNOSIS — E11621 Type 2 diabetes mellitus with foot ulcer: Secondary | ICD-10-CM | POA: Diagnosis not present

## 2020-12-04 DIAGNOSIS — Z951 Presence of aortocoronary bypass graft: Secondary | ICD-10-CM | POA: Diagnosis not present

## 2020-12-04 DIAGNOSIS — L97512 Non-pressure chronic ulcer of other part of right foot with fat layer exposed: Secondary | ICD-10-CM | POA: Diagnosis not present

## 2020-12-04 DIAGNOSIS — K219 Gastro-esophageal reflux disease without esophagitis: Secondary | ICD-10-CM | POA: Diagnosis not present

## 2020-12-06 DIAGNOSIS — I10 Essential (primary) hypertension: Secondary | ICD-10-CM | POA: Diagnosis not present

## 2020-12-06 DIAGNOSIS — L97512 Non-pressure chronic ulcer of other part of right foot with fat layer exposed: Secondary | ICD-10-CM | POA: Diagnosis not present

## 2020-12-06 DIAGNOSIS — I252 Old myocardial infarction: Secondary | ICD-10-CM | POA: Diagnosis not present

## 2020-12-06 DIAGNOSIS — Z7982 Long term (current) use of aspirin: Secondary | ICD-10-CM | POA: Diagnosis not present

## 2020-12-06 DIAGNOSIS — Z951 Presence of aortocoronary bypass graft: Secondary | ICD-10-CM | POA: Diagnosis not present

## 2020-12-06 DIAGNOSIS — S90421A Blister (nonthermal), right great toe, initial encounter: Secondary | ICD-10-CM | POA: Diagnosis not present

## 2020-12-06 DIAGNOSIS — Z4800 Encounter for change or removal of nonsurgical wound dressing: Secondary | ICD-10-CM | POA: Diagnosis not present

## 2020-12-06 DIAGNOSIS — K219 Gastro-esophageal reflux disease without esophagitis: Secondary | ICD-10-CM | POA: Diagnosis not present

## 2020-12-06 DIAGNOSIS — E11621 Type 2 diabetes mellitus with foot ulcer: Secondary | ICD-10-CM | POA: Diagnosis not present

## 2020-12-06 DIAGNOSIS — X58XXXA Exposure to other specified factors, initial encounter: Secondary | ICD-10-CM | POA: Diagnosis not present

## 2020-12-09 DIAGNOSIS — Z4800 Encounter for change or removal of nonsurgical wound dressing: Secondary | ICD-10-CM | POA: Diagnosis not present

## 2020-12-11 DIAGNOSIS — E11621 Type 2 diabetes mellitus with foot ulcer: Secondary | ICD-10-CM | POA: Diagnosis not present

## 2020-12-11 DIAGNOSIS — Z951 Presence of aortocoronary bypass graft: Secondary | ICD-10-CM | POA: Diagnosis not present

## 2020-12-11 DIAGNOSIS — I252 Old myocardial infarction: Secondary | ICD-10-CM | POA: Diagnosis not present

## 2020-12-11 DIAGNOSIS — X58XXXA Exposure to other specified factors, initial encounter: Secondary | ICD-10-CM | POA: Diagnosis not present

## 2020-12-11 DIAGNOSIS — S90421A Blister (nonthermal), right great toe, initial encounter: Secondary | ICD-10-CM | POA: Diagnosis not present

## 2020-12-11 DIAGNOSIS — Z7982 Long term (current) use of aspirin: Secondary | ICD-10-CM | POA: Diagnosis not present

## 2020-12-11 DIAGNOSIS — K219 Gastro-esophageal reflux disease without esophagitis: Secondary | ICD-10-CM | POA: Diagnosis not present

## 2020-12-11 DIAGNOSIS — I10 Essential (primary) hypertension: Secondary | ICD-10-CM | POA: Diagnosis not present

## 2020-12-11 DIAGNOSIS — Z4800 Encounter for change or removal of nonsurgical wound dressing: Secondary | ICD-10-CM | POA: Diagnosis not present

## 2020-12-11 DIAGNOSIS — L97512 Non-pressure chronic ulcer of other part of right foot with fat layer exposed: Secondary | ICD-10-CM | POA: Diagnosis not present

## 2020-12-16 ENCOUNTER — Ambulatory Visit: Payer: Medicare HMO | Admitting: Nurse Practitioner

## 2020-12-16 ENCOUNTER — Other Ambulatory Visit: Payer: Self-pay

## 2020-12-16 ENCOUNTER — Encounter: Payer: Self-pay | Admitting: Nurse Practitioner

## 2020-12-16 VITALS — BP 138/71 | HR 57 | Ht 74.0 in | Wt 229.0 lb

## 2020-12-16 DIAGNOSIS — I10 Essential (primary) hypertension: Secondary | ICD-10-CM

## 2020-12-16 DIAGNOSIS — E782 Mixed hyperlipidemia: Secondary | ICD-10-CM | POA: Diagnosis not present

## 2020-12-16 DIAGNOSIS — N1831 Chronic kidney disease, stage 3a: Secondary | ICD-10-CM

## 2020-12-16 DIAGNOSIS — E1122 Type 2 diabetes mellitus with diabetic chronic kidney disease: Secondary | ICD-10-CM

## 2020-12-16 LAB — POCT UA - MICROALBUMIN
Creatinine, POC: 300 mg/dL
Microalbumin Ur, POC: 80 mg/L

## 2020-12-16 LAB — POCT GLYCOSYLATED HEMOGLOBIN (HGB A1C): Hemoglobin A1C: 6.4 % — AB (ref 4.0–5.6)

## 2020-12-16 MED ORDER — GLIPIZIDE ER 5 MG PO TB24: 5.0000 mg | ORAL_TABLET | Freq: Every day | ORAL | 3 refills | Status: DC

## 2020-12-16 NOTE — Progress Notes (Signed)
12/16/2020        Endocrinology follow-up note     Subjective:    Patient ID: [name] [name], male    DOB: July 30, 1950, PCP Lavella Lemons, PA   Past Medical History:  Diagnosis Date  . Anxiety   . 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 Arthritis   . Coronary artery disease    NSTEMI 056979; multivessel CAD s/p post CABG (LIMA-LAD, SVG-D1, SVG-D2, SVG-OM)  . Essential hypertension   . GERD (gastroesophageal reflux disease)   . Gout   . History of kidney stones   . History of peptic ulcer disease   . Hyperlipidemia   . Left ureteral stone   . Nodule of right lung    CT 04-04-2017  right lower lobe multiple nodules  . OSA on CPAP   . Renal cyst, right    CT 04-04-2017  . Renal insufficiency   . Type 2 diabetes mellitus ([place])   . Wears glasses    Past Surgical History:  Procedure Laterality Date  . CARDIOVASCULAR STRESS TEST  11-13-2016   dr Domenic Polite   Intermediate risk nuclear study w/ medium defect of moderate severity in the basal inferoseptal, basal inferior, mid inferoseptal and mid inferior location (findings consistant w/ prior myocardial infarction)/  mild enlarged LV cavity size;  nuclear stress EF 45%  . COLONOSCOPY N/A 04/13/2017   Procedure: COLONOSCOPY;  Surgeon: Aviva Signs, MD;  [facility];  Service: Gastroenterology;  Laterality: N/A;  . CORONARY ARTERY BYPASS GRAFT N/A 04/25/2013   Procedure: CORONARY ARTERY BYPASS GRAFTING (CABG);  Surgeon: [name], MD;  Location: [facility];  Service: Open Heart Surgery;  Laterality: N/A;  Coronary artery bypass graft times four, on pump, using left internal mammary artery and right greater saphenous vein via endovein harvest.  . CYSTOSCOPY WITH RETROGRADE PYELOGRAM, URETEROSCOPY AND STENT PLACEMENT Left 04/08/2017   Procedure: CYSTOSCOPY WITH RETROGRADE PYELOGRAM, URETEROSCOPY,STENT PLACEMENT;  Surgeon: Lucas Mallow, MD;  Location: [facility];   Service: Urology;  Laterality: Left;  . CYSTOSCOPY/URETEROSCOPY/HOLMIUM LASER/STENT PLACEMENT Left 05/17/2017   Procedure: CYSTOSCOPY / RETROGRADE PYELOGRAM / URETEROSCOPY / HOLMIUM LASER / STONE BASKETRY / STENT EXCHANGE;  Surgeon: Lucas Mallow, MD;  Location: [facility];  Service: Urology;  Laterality: Left;  ONLY NEEDS 45 MIN FOR PROCEDURE  . ELBOW BURSA SURGERY Right 03-10-2017     at [facility]   and removal of spur  . INTRAOPERATIVE TRANSESOPHAGEAL ECHOCARDIOGRAM N/A 04/25/2013   Procedure: INTRAOPERATIVE TRANSESOPHAGEAL ECHOCARDIOGRAM;  Surgeon: [name], MD;  Location: [facility] OR;  Service: Open Heart Surgery;  Laterality: N/A;  . KNEE ARTHROSCOPY Left 10/ 2017  approx.    dr Theda Sers  . LAPAROSCOPIC RIGHT HEMI COLECTOMY Right 04/19/2017   Procedure: OPEN RIGHT HEMI COLECTOMY;  Surgeon: Virl Cagey, MD;  Location: [facility];  Service: General;  Laterality: Right;  . LAPAROSCOPY N/A 04/19/2017   Procedure: LAPAROSCOPY DIAGNOSTIC;  Surgeon: Virl Cagey, MD;  Location: [facility];  Service: General;  Laterality: N/A;  patient knows to arrive at 6:15  . LEFT HEART CATHETERIZATION WITH CORONARY ANGIOGRAM N/A 04/20/2013   Procedure: LEFT HEART CATHETERIZATION WITH CORONARY ANGIOGRAM;  Surgeon: [name], MD;  Location: [facility] CATH LAB;  Service: Cardiovascular;  Laterality: N/A;  . NASAL SEPTUM SURGERY  1990s approx  . TONSILLECTOMY  56 (age 70)  . TRANSTHORACIC ECHOCARDIOGRAM  11-17-2016   dr Domenic Polite  mild LVH,  ef 55-60%, LV wall motion & diastolic function indeterminant assessment,  images were inadequate/  mild LAE/  trivial Tr   Social History   Socioeconomic History  . Marital status: Married    Spouse name: Not on file  . Number of children: Not on file  . Years of education: Not on file  . Highest education level: Not on file  Occupational History  . Occupation: Drives Chief Operating Officer: [employer]  Tobacco Use  . Smoking status: Never Smoker  .  Smokeless tobacco: Never Used  Vaping Use  . Vaping Use: Never used  Substance and Sexual Activity  . Alcohol use: No    Alcohol/week: 0.0 standard drinks  . Drug use: No  . Sexual activity: Yes    Birth control/protection: None  Other Topics Concern  . Not on file  Social History Narrative   Lives with wife, does not exercise but part of his job is strenuous. Works in a body-shop part-time, too.   2 of his mother's sisters and 1 brother died suddenly (family was told MI but no autopsy).    Social Determinants of Health   Financial Resource Strain: Not on file  Food Insecurity: Not on file  Transportation Needs: Not on file  Physical Activity: Not on file  Stress: Not on file  Social Connections: Not on file   Outpatient Encounter Medications as of 12/16/2020  Medication Sig  . allopurinol (ZYLOPRIM) 100 MG tablet Take 100 mg by mouth daily.  . Ascorbic Acid (VITAMIN C) 1000 MG tablet Take 1,000 mg by mouth daily.  Marland Kitchen aspirin EC 81 MG tablet Take 1 tablet (81 mg total) by mouth daily. Swallow whole.  . Blood Glucose Monitoring Suppl (ONE TOUCH ULTRA 2) w/Device KIT USE TO TEST BLOOD ONCE A DAY  . colchicine 0.6 MG tablet Take 0.6 mg by mouth daily.   Marland Kitchen escitalopram (LEXAPRO) 10 MG tablet Take 10 mg by mouth at bedtime.   Marland Kitchen glipiZIDE (GLUCOTROL XL) 5 MG 24 hr tablet Take 1 tablet (5 mg total) by mouth daily with breakfast.  . lisinopril (PRINIVIL,ZESTRIL) 10 MG tablet Take 5 mg by mouth every morning.   . lovastatin (MEVACOR) 20 MG tablet TAKE 1 TABLET AT BEDTIME.  . metFORMIN (GLUCOPHAGE) 500 MG tablet Take 1 tablet (500 mg total) by mouth daily with breakfast.  . metoprolol tartrate (LOPRESSOR) 25 MG tablet Take 0.5 tablets (12.5 mg total) by mouth 2 (two) times daily.  . Multiple Vitamin (MULTIVITAMIN WITH MINERALS) TABS tablet Take 1 tablet by mouth daily.  . Omega-3 Fatty Acids (FISH OIL) 1000 MG CPDR Take 1 capsule by mouth 2 (two) times a day.   Marland Kitchen omeprazole (PRILOSEC) 20 MG  capsule Take 20 mg by mouth every morning.   Glory Rosebush ULTRA test strip USE ONCE DAILY  . pyridoxine (B-6) 200 MG tablet Take 200 mg by mouth daily.  . [DISCONTINUED] glipiZIDE (GLUCOTROL XL) 5 MG 24 hr tablet Take 1 tablet (5 mg total) by mouth daily with breakfast.   No facility-administered encounter medications on file as of 12/16/2020.   ALLERGIES: No Known Allergies VACCINATION STATUS: Immunization History  Administered Date(s) Administered  . Influenza Split 02/10/2014  . Influenza,inj,Quad PF,6+ Mos 04/20/2017    Diabetes He presents for his follow-up diabetic visit. He has type 2 diabetes mellitus. His disease course has been improving. There are no hypoglycemic associated symptoms. Pertinent negatives for hypoglycemia include no confusion, headaches, pallor or seizures. Associated  symptoms include foot ulcerations. Pertinent negatives for diabetes include no chest pain, no fatigue, no polydipsia, no polyphagia, no polyuria and no weakness. There are no hypoglycemic complications. Symptoms are improving. Diabetic complications include heart disease and nephropathy. Risk factors for coronary artery disease include diabetes mellitus, dyslipidemia, hypertension, male sex and sedentary lifestyle. Current diabetic treatment includes oral agent (dual therapy). He is compliant with treatment most of the time. His weight is fluctuating minimally. He is following a generally healthy diet. When asked about meal planning, he reported none. He has not had a previous visit with a dietitian. He never participates in exercise. (He presents today with his logs, no meter, showing inconsistent monitoring pattern.  He has not routinely monitored his glucose since March.  His POCT A1c today is 6.4%, improving from last visit of 7.4%.  He denies any s/s of hypoglycemia.) An ACE inhibitor/angiotensin II receptor blocker is being taken. He does not see a podiatrist.Eye exam is current.  Hyperlipidemia This is a  chronic problem. The current episode started more than 1 year ago. The problem is uncontrolled. Recent lipid tests were reviewed and are variable. Exacerbating diseases include chronic renal disease and diabetes. Factors aggravating his hyperlipidemia include beta blockers. Pertinent negatives include no chest pain, myalgias or shortness of breath. Current antihyperlipidemic treatment includes statins. The current treatment provides mild improvement of lipids. Compliance problems include adherence to diet and adherence to exercise.  Risk factors for coronary artery disease include diabetes mellitus, dyslipidemia, hypertension, male sex and a sedentary lifestyle.  Hypertension This is a chronic problem. The current episode started more than 1 year ago. The problem has been gradually improving since onset. The problem is controlled. Pertinent negatives include no chest pain, headaches, neck pain, palpitations or shortness of breath. There are no associated agents to hypertension. Risk factors for coronary artery disease include dyslipidemia, diabetes mellitus, male gender and sedentary lifestyle. Past treatments include beta blockers and ACE inhibitors. The current treatment provides mild improvement. There are no compliance problems.  Hypertensive end-organ damage includes kidney disease and CAD/MI. Identifiable causes of hypertension include chronic renal disease.    Review of systems  Constitutional: + Minimally fluctuating body weight,  current Body mass index is 29.4 kg/m. , no fatigue, no subjective hyperthermia, no subjective hypothermia Eyes: no blurry vision, no xerophthalmia ENT: no sore throat, no nodules palpated in throat, no dysphagia/odynophagia, no hoarseness Cardiovascular: no chest pain, no shortness of breath, no palpitations, no leg swelling Respiratory: no cough, no shortness of breath Gastrointestinal: no nausea/vomiting/diarrhea Musculoskeletal: no muscle/joint aches Skin: no  rashes, no hyperemia, has healing pressure injury to toe- getting grafting done Neurological: no tremors, no numbness, no tingling, no dizziness Psychiatric: no depression, no anxiety    Objective:    BP 138/71   Pulse (!) 57   Ht _0  (1.88 m)   Wt 229 lb (103.9 kg)   BMI 29.40 kg/m   Wt Readings from Last 3 Encounters:  12/16/20 229 lb (103.9 kg)  08/27/20 224 lb (101.6 kg)  07/31/20 230 lb (104.3 kg)    BP Readings from Last 3 Encounters:  12/16/20 138/71  08/27/20 118/64  07/31/20 (!) 159/76     Physical Exam- Limited  Constitutional:  Body mass index is 29.4 kg/m. , not in acute distress, normal state of mind Eyes:  EOMI, no exophthalmos Neck: Supple Cardiovascular: RRR, no murmurs, rubs, or gallops, no edema Respiratory: Adequate breathing efforts, no crackles, rales, rhonchi, or wheezing Musculoskeletal: no gross deformities,  strength intact in all four extremities, no gross restriction of joint movements Skin:  no rashes, no hyperemia, has healing pressure injury to toe- getting grafting done Neurological: no tremor with outstretched hands   CMP     Component Value Date/Time   NA 142 11/20/2020 1017   K 4.5 11/20/2020 1017   CL 104 11/20/2020 1017   CO2 23 11/20/2020 1017   GLUCOSE 142 (H) 11/20/2020 1017   GLUCOSE 129 01/23/2020 1002   BUN 16 11/20/2020 1017   CREATININE 1.43 (H) 11/20/2020 1017   CREATININE 1.41 (H) 01/23/2020 1002   CALCIUM 9.6 11/20/2020 1017   PROT 6.6 11/20/2020 1017   ALBUMIN 4.4 11/20/2020 1017   AST 24 11/20/2020 1017   ALT 20 11/20/2020 1017   ALKPHOS 88 11/20/2020 1017   BILITOT 0.9 11/20/2020 1017   GFRNONAA 40 (L) 07/24/2020 0932   GFRNONAA 52 (L) 01/23/2020 1002   GFRAA 47 (L) 07/24/2020 0932   GFRAA 60 01/23/2020 1002     Diabetic Labs (most recent): Lab Results  Component Value Date   HGBA1C 6.4 (A) 12/16/2020   HGBA1C 7.4 (A) 07/31/2020   HGBA1C 6.5 (A) 01/31/2020     Lipid Panel ( most recent) Lipid  Panel     Component Value Date/Time   CHOL 161 11/20/2020 1017   TRIG 191 (H) 11/20/2020 1017   HDL 43 11/20/2020 1017   CHOLHDL 3.7 11/20/2020 1017   CHOLHDL 4.0 09/04/2019 0916   VLDL 21 07/02/2016 0853   LDLCALC 86 11/20/2020 1017   LDLCALC 87 09/04/2019 0916     Assessment & Plan:   1) Type 2 diabetes mellitus with other circulatory complications , and acute on chronic renal insufficiency   His diabetes is complicated by coronary artery disease status post coronary artery bypass graft, acute on chronic renal insufficiency and patient remains at a high risk for more acute and chronic complications of diabetes which include CAD, CVA, CKD, retinopathy, and neuropathy. These are all discussed in detail with the patient.  He presents today with his logs, no meter, showing inconsistent monitoring pattern.  He has not routinely monitored his glucose since March.  His POCT A1c today is 6.4%, improving from last visit of 7.4%.  He denies any s/s of hypoglycemia.  - Recent labs reviewed.  POCT UM today is 57.  - Nutritional counseling repeated at each appointment due to patients tendency to fall back in to old habits.  - The patient admits there is a room for improvement in their diet and drink choices. -  Suggestion is made for the patient to avoid simple carbohydrates from their diet including Cakes, Sweet Desserts / Pastries, Ice Cream, Soda (diet and regular), Sweet Tea, Candies, Chips, Cookies, Sweet Pastries, Store Bought Juices, Alcohol in Excess of 1-2 drinks a day, Artificial Sweeteners, Coffee Creamer, and "Sugar-free" Products. This will help patient to have stable blood glucose profile and potentially avoid unintended weight gain.   - I encouraged the patient to switch to unprocessed or minimally processed complex starch and increased protein intake (animal or plant source), fruits, and vegetables.   - Patient is advised to stick to a routine mealtimes to eat 3 meals a day and  avoid unnecessary snacks (to snack only to correct hypoglycemia).  - I have approached patient with the following individualized plan to manage diabetes and patient agrees.  -Given his improved glycemic profile, he is advised to continue current medication regimen consisting of Metformin 500 mg po once daily  with breakfast (due to kidney decline which is now stable) and Glipizide 5 mg XL daily with breakfast.    -He is encouraged to restart monitoring blood glucose at least one time daily before breakfast and report if blood glucose readings are above 200 mg/dL 3 times a week.    2) BP/HTN:  His blood pressure is controlled to target.  He is advised to continue Lisinopril 5 mg po daily and Metoprolol 12.5 mg po BID.  3) Lipids/HPL:  His most recent lipid panel from 11/20/20 shows controlled LDL of 86 and elevated triglycerides of 191.  He is advised to continue Lovastatin 20 mg po daily at bedtime and fish oil supplement twice daily.  Encouraged him to avoid fried foods and butter.  4) Weight management:  His Body mass index is 29.4 kg/m.-a candidate for modest weight loss.  Detailed carbohydrates and exercise regimen was discussed with him.  5) Chronic Care/Health Maintenance: -Patient is on ACE and Statin medications and encouraged to continue to follow up with Ophthalmology, Podiatrist at least yearly or according to recommendations, and advised to  stay away from smoking. I have recommended yearly flu vaccine and pneumonia vaccination at least every 5 years; moderate intensity exercise for up to 150 minutes weekly; and  sleep for at least 7 hours a day.   - I advised patient to maintain close follow up with Lavella Lemons, PA for primary care needs.     I spent 30 minutes in the care of the patient today including review of labs from [facility], Lipids, Thyroid Function, Hematology (current and previous including abstractions from other facilities); face-to-face time discussing  his blood  glucose readings/logs, discussing hypoglycemia and hyperglycemia episodes and symptoms, medications doses, his options of short and long term treatment based on the latest standards of care / guidelines;  discussion about incorporating lifestyle medicine;  and documenting the encounter.    Please refer to Patient Instructions for Blood Glucose Monitoring and Insulin/Medications Dosing Guide"  in media tab for additional information. Please  also refer to " Patient Self Inventory" in the Media  tab for reviewed elements of pertinent patient history.  [name] [name] participated in the discussions, expressed understanding, and voiced agreement with the above plans.  All questions were answered to his satisfaction. he is encouraged to contact clinic should he have any questions or concerns prior to his return visit.   Follow up plan: -Return in about 4 months (around 04/17/2021) for Diabetes F/U with A1c in office, No previsit labs, Bring meter and logs.    Rayetta Pigg, [facility] [address] [place], [place] [address] Phone: [phone] Fax: [phone]  12/16/2020, 8:57 AM

## 2020-12-16 NOTE — Patient Instructions (Signed)

## 2020-12-18 DIAGNOSIS — I252 Old myocardial infarction: Secondary | ICD-10-CM | POA: Diagnosis not present

## 2020-12-18 DIAGNOSIS — Z7982 Long term (current) use of aspirin: Secondary | ICD-10-CM | POA: Diagnosis not present

## 2020-12-18 DIAGNOSIS — L97512 Non-pressure chronic ulcer of other part of right foot with fat layer exposed: Secondary | ICD-10-CM | POA: Diagnosis not present

## 2020-12-18 DIAGNOSIS — I10 Essential (primary) hypertension: Secondary | ICD-10-CM | POA: Diagnosis not present

## 2020-12-18 DIAGNOSIS — E11621 Type 2 diabetes mellitus with foot ulcer: Secondary | ICD-10-CM | POA: Diagnosis not present

## 2020-12-18 DIAGNOSIS — X58XXXA Exposure to other specified factors, initial encounter: Secondary | ICD-10-CM | POA: Diagnosis not present

## 2020-12-18 DIAGNOSIS — Z4800 Encounter for change or removal of nonsurgical wound dressing: Secondary | ICD-10-CM | POA: Diagnosis not present

## 2020-12-18 DIAGNOSIS — S90421A Blister (nonthermal), right great toe, initial encounter: Secondary | ICD-10-CM | POA: Diagnosis not present

## 2020-12-18 DIAGNOSIS — K219 Gastro-esophageal reflux disease without esophagitis: Secondary | ICD-10-CM | POA: Diagnosis not present

## 2020-12-18 DIAGNOSIS — Z951 Presence of aortocoronary bypass graft: Secondary | ICD-10-CM | POA: Diagnosis not present

## 2020-12-26 DIAGNOSIS — M86171 Other acute osteomyelitis, right ankle and foot: Secondary | ICD-10-CM | POA: Diagnosis not present

## 2020-12-26 DIAGNOSIS — R600 Localized edema: Secondary | ICD-10-CM | POA: Diagnosis not present

## 2020-12-26 DIAGNOSIS — M7989 Other specified soft tissue disorders: Secondary | ICD-10-CM | POA: Diagnosis not present

## 2020-12-26 DIAGNOSIS — L97512 Non-pressure chronic ulcer of other part of right foot with fat layer exposed: Secondary | ICD-10-CM | POA: Diagnosis not present

## 2020-12-26 DIAGNOSIS — L97519 Non-pressure chronic ulcer of other part of right foot with unspecified severity: Secondary | ICD-10-CM | POA: Diagnosis not present

## 2020-12-26 DIAGNOSIS — E1169 Type 2 diabetes mellitus with other specified complication: Secondary | ICD-10-CM | POA: Diagnosis not present

## 2020-12-26 DIAGNOSIS — E11621 Type 2 diabetes mellitus with foot ulcer: Secondary | ICD-10-CM | POA: Diagnosis not present

## 2020-12-26 DIAGNOSIS — M869 Osteomyelitis, unspecified: Secondary | ICD-10-CM | POA: Diagnosis not present

## 2020-12-26 DIAGNOSIS — L97516 Non-pressure chronic ulcer of other part of right foot with bone involvement without evidence of necrosis: Secondary | ICD-10-CM | POA: Diagnosis not present

## 2020-12-27 DIAGNOSIS — Z6830 Body mass index (BMI) 30.0-30.9, adult: Secondary | ICD-10-CM | POA: Diagnosis not present

## 2020-12-27 DIAGNOSIS — M1A00X0 Idiopathic chronic gout, unspecified site, without tophus (tophi): Secondary | ICD-10-CM | POA: Diagnosis not present

## 2021-01-01 DIAGNOSIS — M869 Osteomyelitis, unspecified: Secondary | ICD-10-CM | POA: Diagnosis not present

## 2021-01-01 DIAGNOSIS — I129 Hypertensive chronic kidney disease with stage 1 through stage 4 chronic kidney disease, or unspecified chronic kidney disease: Secondary | ICD-10-CM | POA: Diagnosis not present

## 2021-01-01 DIAGNOSIS — Z9989 Dependence on other enabling machines and devices: Secondary | ICD-10-CM | POA: Diagnosis not present

## 2021-01-01 DIAGNOSIS — E1122 Type 2 diabetes mellitus with diabetic chronic kidney disease: Secondary | ICD-10-CM | POA: Diagnosis not present

## 2021-01-01 DIAGNOSIS — M86171 Other acute osteomyelitis, right ankle and foot: Secondary | ICD-10-CM | POA: Diagnosis not present

## 2021-01-01 DIAGNOSIS — Z8739 Personal history of other diseases of the musculoskeletal system and connective tissue: Secondary | ICD-10-CM | POA: Diagnosis not present

## 2021-01-01 DIAGNOSIS — B951 Streptococcus, group B, as the cause of diseases classified elsewhere: Secondary | ICD-10-CM | POA: Diagnosis not present

## 2021-01-01 DIAGNOSIS — Z79899 Other long term (current) drug therapy: Secondary | ICD-10-CM | POA: Diagnosis not present

## 2021-01-01 DIAGNOSIS — Z794 Long term (current) use of insulin: Secondary | ICD-10-CM | POA: Diagnosis not present

## 2021-01-01 DIAGNOSIS — L97509 Non-pressure chronic ulcer of other part of unspecified foot with unspecified severity: Secondary | ICD-10-CM | POA: Diagnosis not present

## 2021-01-01 DIAGNOSIS — I10 Essential (primary) hypertension: Secondary | ICD-10-CM | POA: Diagnosis not present

## 2021-01-01 DIAGNOSIS — M109 Gout, unspecified: Secondary | ICD-10-CM | POA: Diagnosis not present

## 2021-01-01 DIAGNOSIS — N183 Chronic kidney disease, stage 3 unspecified: Secondary | ICD-10-CM | POA: Diagnosis not present

## 2021-01-01 DIAGNOSIS — I2510 Atherosclerotic heart disease of native coronary artery without angina pectoris: Secondary | ICD-10-CM | POA: Diagnosis not present

## 2021-01-01 DIAGNOSIS — E1140 Type 2 diabetes mellitus with diabetic neuropathy, unspecified: Secondary | ICD-10-CM | POA: Diagnosis not present

## 2021-01-01 DIAGNOSIS — Z01818 Encounter for other preprocedural examination: Secondary | ICD-10-CM | POA: Diagnosis not present

## 2021-01-01 DIAGNOSIS — Z7982 Long term (current) use of aspirin: Secondary | ICD-10-CM | POA: Diagnosis not present

## 2021-01-01 DIAGNOSIS — Z951 Presence of aortocoronary bypass graft: Secondary | ICD-10-CM | POA: Diagnosis not present

## 2021-01-01 DIAGNOSIS — G4733 Obstructive sleep apnea (adult) (pediatric): Secondary | ICD-10-CM | POA: Diagnosis not present

## 2021-01-01 DIAGNOSIS — E785 Hyperlipidemia, unspecified: Secondary | ICD-10-CM | POA: Diagnosis not present

## 2021-01-01 DIAGNOSIS — L97512 Non-pressure chronic ulcer of other part of right foot with fat layer exposed: Secondary | ICD-10-CM | POA: Diagnosis not present

## 2021-01-01 DIAGNOSIS — E1143 Type 2 diabetes mellitus with diabetic autonomic (poly)neuropathy: Secondary | ICD-10-CM | POA: Diagnosis not present

## 2021-01-01 DIAGNOSIS — Z20822 Contact with and (suspected) exposure to covid-19: Secondary | ICD-10-CM | POA: Diagnosis not present

## 2021-01-01 DIAGNOSIS — Z1629 Resistance to other single specified antibiotic: Secondary | ICD-10-CM | POA: Diagnosis not present

## 2021-01-01 DIAGNOSIS — M10079 Idiopathic gout, unspecified ankle and foot: Secondary | ICD-10-CM | POA: Diagnosis not present

## 2021-01-01 DIAGNOSIS — E1169 Type 2 diabetes mellitus with other specified complication: Secondary | ICD-10-CM | POA: Diagnosis not present

## 2021-01-01 DIAGNOSIS — E11621 Type 2 diabetes mellitus with foot ulcer: Secondary | ICD-10-CM | POA: Diagnosis not present

## 2021-01-02 DIAGNOSIS — I2510 Atherosclerotic heart disease of native coronary artery without angina pectoris: Secondary | ICD-10-CM | POA: Diagnosis not present

## 2021-01-02 DIAGNOSIS — Z951 Presence of aortocoronary bypass graft: Secondary | ICD-10-CM | POA: Diagnosis not present

## 2021-01-02 DIAGNOSIS — G4733 Obstructive sleep apnea (adult) (pediatric): Secondary | ICD-10-CM | POA: Diagnosis not present

## 2021-01-02 DIAGNOSIS — Z7982 Long term (current) use of aspirin: Secondary | ICD-10-CM | POA: Diagnosis not present

## 2021-01-02 DIAGNOSIS — Z1629 Resistance to other single specified antibiotic: Secondary | ICD-10-CM | POA: Diagnosis not present

## 2021-01-02 DIAGNOSIS — N183 Chronic kidney disease, stage 3 unspecified: Secondary | ICD-10-CM | POA: Diagnosis not present

## 2021-01-02 DIAGNOSIS — E1169 Type 2 diabetes mellitus with other specified complication: Secondary | ICD-10-CM | POA: Diagnosis not present

## 2021-01-02 DIAGNOSIS — Z8739 Personal history of other diseases of the musculoskeletal system and connective tissue: Secondary | ICD-10-CM | POA: Diagnosis not present

## 2021-01-02 DIAGNOSIS — E1122 Type 2 diabetes mellitus with diabetic chronic kidney disease: Secondary | ICD-10-CM | POA: Diagnosis not present

## 2021-01-02 DIAGNOSIS — M869 Osteomyelitis, unspecified: Secondary | ICD-10-CM | POA: Diagnosis not present

## 2021-01-03 DIAGNOSIS — Z951 Presence of aortocoronary bypass graft: Secondary | ICD-10-CM | POA: Diagnosis not present

## 2021-01-03 DIAGNOSIS — I2510 Atherosclerotic heart disease of native coronary artery without angina pectoris: Secondary | ICD-10-CM | POA: Diagnosis not present

## 2021-01-03 DIAGNOSIS — E1169 Type 2 diabetes mellitus with other specified complication: Secondary | ICD-10-CM | POA: Diagnosis not present

## 2021-01-03 DIAGNOSIS — Z9989 Dependence on other enabling machines and devices: Secondary | ICD-10-CM | POA: Diagnosis not present

## 2021-01-03 DIAGNOSIS — N183 Chronic kidney disease, stage 3 unspecified: Secondary | ICD-10-CM | POA: Diagnosis not present

## 2021-01-03 DIAGNOSIS — G4733 Obstructive sleep apnea (adult) (pediatric): Secondary | ICD-10-CM | POA: Diagnosis not present

## 2021-01-03 DIAGNOSIS — E1122 Type 2 diabetes mellitus with diabetic chronic kidney disease: Secondary | ICD-10-CM | POA: Diagnosis not present

## 2021-01-03 DIAGNOSIS — M10079 Idiopathic gout, unspecified ankle and foot: Secondary | ICD-10-CM | POA: Diagnosis not present

## 2021-01-03 DIAGNOSIS — Z79899 Other long term (current) drug therapy: Secondary | ICD-10-CM | POA: Diagnosis not present

## 2021-01-03 DIAGNOSIS — M869 Osteomyelitis, unspecified: Secondary | ICD-10-CM | POA: Diagnosis not present

## 2021-01-04 DIAGNOSIS — M869 Osteomyelitis, unspecified: Secondary | ICD-10-CM | POA: Diagnosis not present

## 2021-01-04 DIAGNOSIS — E1169 Type 2 diabetes mellitus with other specified complication: Secondary | ICD-10-CM | POA: Diagnosis not present

## 2021-01-04 DIAGNOSIS — M109 Gout, unspecified: Secondary | ICD-10-CM | POA: Diagnosis not present

## 2021-01-04 DIAGNOSIS — Z79899 Other long term (current) drug therapy: Secondary | ICD-10-CM | POA: Diagnosis not present

## 2021-01-04 DIAGNOSIS — Z9989 Dependence on other enabling machines and devices: Secondary | ICD-10-CM | POA: Diagnosis not present

## 2021-01-04 DIAGNOSIS — I2510 Atherosclerotic heart disease of native coronary artery without angina pectoris: Secondary | ICD-10-CM | POA: Diagnosis not present

## 2021-01-04 DIAGNOSIS — G4733 Obstructive sleep apnea (adult) (pediatric): Secondary | ICD-10-CM | POA: Diagnosis not present

## 2021-01-04 DIAGNOSIS — M10079 Idiopathic gout, unspecified ankle and foot: Secondary | ICD-10-CM | POA: Diagnosis not present

## 2021-01-04 DIAGNOSIS — E1122 Type 2 diabetes mellitus with diabetic chronic kidney disease: Secondary | ICD-10-CM | POA: Diagnosis not present

## 2021-01-04 DIAGNOSIS — N183 Chronic kidney disease, stage 3 unspecified: Secondary | ICD-10-CM | POA: Diagnosis not present

## 2021-01-06 DIAGNOSIS — Z951 Presence of aortocoronary bypass graft: Secondary | ICD-10-CM | POA: Diagnosis not present

## 2021-01-06 DIAGNOSIS — I2510 Atherosclerotic heart disease of native coronary artery without angina pectoris: Secondary | ICD-10-CM | POA: Diagnosis not present

## 2021-01-06 DIAGNOSIS — Z79899 Other long term (current) drug therapy: Secondary | ICD-10-CM | POA: Diagnosis not present

## 2021-01-06 DIAGNOSIS — E1122 Type 2 diabetes mellitus with diabetic chronic kidney disease: Secondary | ICD-10-CM | POA: Diagnosis not present

## 2021-01-06 DIAGNOSIS — N183 Chronic kidney disease, stage 3 unspecified: Secondary | ICD-10-CM | POA: Diagnosis not present

## 2021-01-06 DIAGNOSIS — G4733 Obstructive sleep apnea (adult) (pediatric): Secondary | ICD-10-CM | POA: Diagnosis not present

## 2021-01-06 DIAGNOSIS — M109 Gout, unspecified: Secondary | ICD-10-CM | POA: Diagnosis not present

## 2021-01-06 DIAGNOSIS — M869 Osteomyelitis, unspecified: Secondary | ICD-10-CM | POA: Diagnosis not present

## 2021-01-06 DIAGNOSIS — Z7982 Long term (current) use of aspirin: Secondary | ICD-10-CM | POA: Diagnosis not present

## 2021-01-06 DIAGNOSIS — E1169 Type 2 diabetes mellitus with other specified complication: Secondary | ICD-10-CM | POA: Diagnosis not present

## 2021-01-07 DIAGNOSIS — E11621 Type 2 diabetes mellitus with foot ulcer: Secondary | ICD-10-CM | POA: Diagnosis not present

## 2021-01-07 DIAGNOSIS — M869 Osteomyelitis, unspecified: Secondary | ICD-10-CM | POA: Diagnosis not present

## 2021-01-08 DIAGNOSIS — Z20822 Contact with and (suspected) exposure to covid-19: Secondary | ICD-10-CM | POA: Diagnosis not present

## 2021-01-08 DIAGNOSIS — E785 Hyperlipidemia, unspecified: Secondary | ICD-10-CM | POA: Diagnosis not present

## 2021-01-08 DIAGNOSIS — Z794 Long term (current) use of insulin: Secondary | ICD-10-CM | POA: Diagnosis not present

## 2021-01-08 DIAGNOSIS — E11621 Type 2 diabetes mellitus with foot ulcer: Secondary | ICD-10-CM | POA: Diagnosis not present

## 2021-01-08 DIAGNOSIS — E1169 Type 2 diabetes mellitus with other specified complication: Secondary | ICD-10-CM | POA: Diagnosis not present

## 2021-01-08 DIAGNOSIS — Z01818 Encounter for other preprocedural examination: Secondary | ICD-10-CM | POA: Diagnosis not present

## 2021-01-08 DIAGNOSIS — I10 Essential (primary) hypertension: Secondary | ICD-10-CM | POA: Diagnosis not present

## 2021-01-08 DIAGNOSIS — E1143 Type 2 diabetes mellitus with diabetic autonomic (poly)neuropathy: Secondary | ICD-10-CM | POA: Diagnosis not present

## 2021-01-08 DIAGNOSIS — M869 Osteomyelitis, unspecified: Secondary | ICD-10-CM | POA: Diagnosis not present

## 2021-01-08 DIAGNOSIS — L97512 Non-pressure chronic ulcer of other part of right foot with fat layer exposed: Secondary | ICD-10-CM | POA: Diagnosis not present

## 2021-01-09 DIAGNOSIS — I25119 Atherosclerotic heart disease of native coronary artery with unspecified angina pectoris: Secondary | ICD-10-CM | POA: Diagnosis not present

## 2021-01-09 DIAGNOSIS — E119 Type 2 diabetes mellitus without complications: Secondary | ICD-10-CM | POA: Diagnosis not present

## 2021-01-09 DIAGNOSIS — M869 Osteomyelitis, unspecified: Secondary | ICD-10-CM | POA: Diagnosis not present

## 2021-01-09 DIAGNOSIS — E11621 Type 2 diabetes mellitus with foot ulcer: Secondary | ICD-10-CM | POA: Diagnosis not present

## 2021-01-09 DIAGNOSIS — E782 Mixed hyperlipidemia: Secondary | ICD-10-CM | POA: Diagnosis not present

## 2021-01-09 DIAGNOSIS — I10 Essential (primary) hypertension: Secondary | ICD-10-CM | POA: Diagnosis not present

## 2021-01-14 ENCOUNTER — Other Ambulatory Visit: Payer: Self-pay | Admitting: "Endocrinology

## 2021-01-14 DIAGNOSIS — N1831 Chronic kidney disease, stage 3a: Secondary | ICD-10-CM

## 2021-01-15 DIAGNOSIS — I10 Essential (primary) hypertension: Secondary | ICD-10-CM | POA: Diagnosis not present

## 2021-01-15 DIAGNOSIS — Z794 Long term (current) use of insulin: Secondary | ICD-10-CM | POA: Diagnosis not present

## 2021-01-15 DIAGNOSIS — E1143 Type 2 diabetes mellitus with diabetic autonomic (poly)neuropathy: Secondary | ICD-10-CM | POA: Diagnosis not present

## 2021-01-15 DIAGNOSIS — E11621 Type 2 diabetes mellitus with foot ulcer: Secondary | ICD-10-CM | POA: Diagnosis not present

## 2021-01-15 DIAGNOSIS — Z01818 Encounter for other preprocedural examination: Secondary | ICD-10-CM | POA: Diagnosis not present

## 2021-01-15 DIAGNOSIS — M869 Osteomyelitis, unspecified: Secondary | ICD-10-CM | POA: Diagnosis not present

## 2021-01-15 DIAGNOSIS — E785 Hyperlipidemia, unspecified: Secondary | ICD-10-CM | POA: Diagnosis not present

## 2021-01-15 DIAGNOSIS — Z20822 Contact with and (suspected) exposure to covid-19: Secondary | ICD-10-CM | POA: Diagnosis not present

## 2021-01-15 DIAGNOSIS — L97512 Non-pressure chronic ulcer of other part of right foot with fat layer exposed: Secondary | ICD-10-CM | POA: Diagnosis not present

## 2021-01-15 DIAGNOSIS — E1169 Type 2 diabetes mellitus with other specified complication: Secondary | ICD-10-CM | POA: Diagnosis not present

## 2021-01-16 DIAGNOSIS — M869 Osteomyelitis, unspecified: Secondary | ICD-10-CM | POA: Diagnosis not present

## 2021-01-16 DIAGNOSIS — E11621 Type 2 diabetes mellitus with foot ulcer: Secondary | ICD-10-CM | POA: Diagnosis not present

## 2021-01-17 DIAGNOSIS — M869 Osteomyelitis, unspecified: Secondary | ICD-10-CM | POA: Diagnosis not present

## 2021-01-17 DIAGNOSIS — E11621 Type 2 diabetes mellitus with foot ulcer: Secondary | ICD-10-CM | POA: Diagnosis not present

## 2021-01-22 ENCOUNTER — Ambulatory Visit (HOSPITAL_COMMUNITY)
Admission: RE | Admit: 2021-01-22 | Discharge: 2021-01-22 | Disposition: A | Discharge status: Home / Self Care | Payer: Medicare HMO | Source: Ambulatory Visit | Attending: Internal Medicine | Admitting: Internal Medicine

## 2021-01-22 ENCOUNTER — Ambulatory Visit: Payer: Medicare HMO | Admitting: Internal Medicine

## 2021-01-22 ENCOUNTER — Other Ambulatory Visit: Payer: Self-pay

## 2021-01-22 ENCOUNTER — Encounter: Payer: Self-pay | Admitting: Internal Medicine

## 2021-01-22 ENCOUNTER — Encounter (HOSPITAL_BASED_OUTPATIENT_CLINIC_OR_DEPARTMENT_OTHER): Payer: Medicare HMO | Attending: Internal Medicine | Admitting: Internal Medicine

## 2021-01-22 ENCOUNTER — Other Ambulatory Visit (HOSPITAL_COMMUNITY): Payer: Self-pay | Admitting: Internal Medicine

## 2021-01-22 DIAGNOSIS — M86371 Chronic multifocal osteomyelitis, right ankle and foot: Secondary | ICD-10-CM | POA: Diagnosis not present

## 2021-01-22 DIAGNOSIS — M86171 Other acute osteomyelitis, right ankle and foot: Secondary | ICD-10-CM | POA: Diagnosis not present

## 2021-01-22 DIAGNOSIS — L97511 Non-pressure chronic ulcer of other part of right foot limited to breakdown of skin: Secondary | ICD-10-CM | POA: Diagnosis not present

## 2021-01-22 DIAGNOSIS — Z9289 Personal history of other medical treatment: Secondary | ICD-10-CM

## 2021-01-22 DIAGNOSIS — E11621 Type 2 diabetes mellitus with foot ulcer: Secondary | ICD-10-CM | POA: Insufficient documentation

## 2021-01-22 DIAGNOSIS — I10 Essential (primary) hypertension: Secondary | ICD-10-CM | POA: Diagnosis not present

## 2021-01-22 NOTE — Progress Notes (Signed)
[facility] for Infectious Disease      Reason for Consult: osteomyelitis of the right great toe    Referring Physician: Dr. Loyola Mast    Patient ID: [name] [name], male    DOB: 06-Oct-1950, 70 y.o.   MRN: [id number]  HPI:   He is here for evaluation of a right great toe wound with ulceration on the medial plantar side to bone.   He has had an ongoing wound and followed by wound care (Dr. Adelina Mings) in [place] and last seen by her on 01/15/21.  This initially started around August when he had a small abrasion to his toe and worsened and started wound care in the Fall.  Initially it improved though by early 2022, the wound worsened and later started draining.  He had an xray and MRI c/w osteomyelitis and hospitalized and started on antibiotics.  Reports note that the toe is much improved and the smallest it has been.  He has been on IV Invanz and oral cipro by Dr. Loyola Mast that was started during his hospitalization for the toe on 01/01/21.  Cultures noted in Epic and include anaerobic and are mixed cultures.   He also reports having had vascular studies of his blood flow and no issues identified, no records available to me.    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-grade neoplasm)   Arthritis    Coronary artery disease    NSTEMI [id number]; multivessel CAD s/p post CABG (LIMA-LAD, SVG-D1, SVG-D2, SVG-OM)   Essential hypertension    GERD (gastroesophageal reflux disease)    Gout    History of kidney stones    History of peptic ulcer disease    Hyperlipidemia    Left ureteral stone    Nodule of right lung    CT 04-04-2017  right lower lobe multiple nodules   OSA on CPAP    Renal cyst, right    CT 04-04-2017   Renal insufficiency    Type 2 diabetes mellitus ([place])    Wears glasses     Prior to Admission medications   Medication Sig Start Date End Date Taking? Authorizing Provider  allopurinol (ZYLOPRIM)  100 MG tablet Take 100 mg by mouth daily. 04/05/20   [provider]  Ascorbic Acid (VITAMIN C) 1000 MG tablet Take 1,000 mg by mouth daily.    [provider]  aspirin EC 81 MG tablet Take 1 tablet (81 mg total) by mouth daily. Swallow whole. 08/27/20   [name], MD  Blood Glucose Monitoring Suppl (ONE TOUCH ULTRA 2) w/Device KIT USE TO TEST BLOOD ONCE A DAY 10/08/20   Brita Romp, NP  colchicine 0.6 MG tablet Take 0.6 mg by mouth daily.  07/27/13   [name], MD  escitalopram (LEXAPRO) 10 MG tablet Take 10 mg by mouth at bedtime.     [provider]  glipiZIDE (GLUCOTROL XL) 5 MG 24 hr tablet Take 1 tablet (5 mg total) by mouth daily with breakfast. 12/16/20   Brita Romp, NP  lisinopril (PRINIVIL,ZESTRIL) 10 MG tablet Take 5 mg by mouth every morning.     [provider]  lovastatin (MEVACOR) 20 MG tablet TAKE 1 TABLET AT BEDTIME. 10/08/20   [name], MD  metFORMIN (GLUCOPHAGE) 500 MG tablet TAKE 1 TABLET BY MOUTH TWICE A DAY 01/14/21   Cassandria Anger, MD  metoprolol tartrate (LOPRESSOR)  25 MG tablet Take 0.5 tablets (12.5 mg total) by mouth 2 (two) times daily. 10/26/18   [name], MD  Multiple Vitamin (MULTIVITAMIN WITH MINERALS) TABS tablet Take 1 tablet by mouth daily.    [provider]  Omega-3 Fatty Acids (FISH OIL) 1000 MG CPDR Take 1 capsule by mouth 2 (two) times a day.     [provider]  omeprazole (PRILOSEC) 20 MG capsule Take 20 mg by mouth every morning.     [provider]  [facility] ULTRA test strip USE ONCE DAILY 10/29/20   Brita Romp, NP  pyridoxine (B-6) 200 MG tablet Take 200 mg by mouth daily.    [provider]    No Known Allergies  Social History   Tobacco Use   Smoking status: Never   Smokeless tobacco: Never  Vaping Use   Vaping Use: Never used  Substance Use Topics   Alcohol use: No    Alcohol/week: 0.0 standard drinks   Drug  use: No    Family History  Problem Relation Age of Onset   CAD Mother    CAD Father    Lung cancer Father    Colon cancer Father     Review of Systems  Constitutional: negative for fevers and chills Gastrointestinal: negative for nausea and diarrhea Integument/breast: negative for rash All other systems reviewed and are negative    Constitutional: in no apparent distress There were no vitals filed for this visit. EYES: anicteric ENMT: no thrush Respiratory: normal respiratory effort GI: Bowel sounds are normal, liver is not enlarged, spleen is not enlarged Musculoskeletal: right great toe with a dry, closing area with just a scab and no draiange, no surrounding erythema; a small (1 mm) area located medially with no drainage, dry; a small blistering area of the 3rd toe Skin: negatives: no rash Neuro: non-focal; decreased sensation of right foot  Labs: Lab Results  Component Value Date   WBC 8.2 04/21/2017   HGB 13.9 05/17/2017   HCT 41.0 05/17/2017   MCV 92.4 04/21/2017   PLT 191 04/21/2017    Lab Results  Component Value Date   CREATININE 1.43 (H) 11/20/2020   BUN 16 11/20/2020   NA 142 11/20/2020   K 4.5 11/20/2020   CL 104 11/20/2020   CO2 23 11/20/2020    Lab Results  Component Value Date   ALT 20 11/20/2020   AST 24 11/20/2020   ALKPHOS 88 11/20/2020   BILITOT 0.9 11/20/2020   INR 1.01 04/13/2017     Assessment:  left great toe osteomyelitis.  He has a positive xray, draining sinus, probe to bone, now resolving on appropriate antibiotics.  I discussed the treatment and typically 6 week course and will plan to continue antibiotics through August 2nd.  I have advised he stop the cipro and continue just the Invanz as there is no indication for both.   Plan: 1)  Inavanz through August 2nd 2) plan to pull the picc line after the last dose 3) follow up video visit prior to August 2nd 4) copies of ongoing labs - previous labs in [place] except for most  recent ones 5) continue wound care, possible HBO with evaluation today

## 2021-01-22 NOTE — Progress Notes (Signed)
[name] [name] ([id number]) Visit Report for 01/22/2021 Abuse/Suicide Risk Screen Details Patient Name: Date of Service: [name] [name], [name] [name] 01/22/2021 10:30 A M Medical Record Number: [id number] Patient Account Number: [id number] Date of Birth/Sex: Treating RN: 30-Dec-1950 (70 y.o. [name], [name] Primary Care Yuchen Fedor: [name] Other Clinician: Referring Ruvim Risko: Treating Rhaya Coale/Extender: [place] Clam in Treatment: 0 Abuse/Suicide Risk Screen Items Answer ABUSE RISK SCREEN: Has anyone close to you tried to hurt or harm you recentlyo No Do you feel uncomfortable with anyone in your familyo No Has anyone forced you do things that you didnt want to doo No Electronic Signature(s) Signed: 01/22/2021 6:12:45 PM By: Rhae Hammock RN Entered By: Rhae Hammock on 01/22/2021 11:17:54 -------------------------------------------------------------------------------- Activities of Daily Living Details Patient Name: Date of Service: [name] [name] 01/22/2021 10:30 A M Medical Record Number: [id number] Patient Account Number: [id number] Date of Birth/Sex: Treating RN: [birth year] (70 y.o. [name], [name] Primary Care Kennth Vanbenschoten: [name] Other Clinician: Referring Leeam Cedrone: Treating Harout Scheurich/Extender: [place] Clam in Treatment: 0 Activities of Daily Living Items Answer Activities of Daily Living (Please select one for each item) Drive Automobile Completely Able T Medications ake Completely Able Use T elephone Completely Able Care for Appearance Completely Able Use T oilet Completely Able Bath / Shower Completely Able Dress Self Completely Able Feed Self Completely Able Walk Completely Able Get In / Out Bed Completely Able Housework Completely Able Prepare Meals Completely [place] Completely Able Shop for Self Completely Able Electronic Signature(s) Signed: 01/22/2021 6:12:45 PM By: Rhae Hammock RN Entered By: Rhae Hammock on 01/22/2021 11:18:28 -------------------------------------------------------------------------------- Education Screening Details Patient Name: Date of Service: [name] Hawthorne B. 01/22/2021 10:30 A M Medical Record Number: [id number] Patient Account Number: [id number] Date of Birth/Sex: Treating RN: 12-01-1950 (70 y.o. [name], [name] Primary Care Poetry Cerro: [name] Other Clinician: Referring Dalores Weger: Treating Makai Agostinelli/Extender: [place] Clam in Treatment: 0 Primary Learner Assessed: Patient Learning Preferences/Education Level/Primary Language Learning Preference: Explanation, Demonstration, Communication Board, Printed Material Highest Education Level: High School Preferred Language: English Cognitive Barrier Language Barrier: No Translator Needed: No Memory Deficit: No Emotional Barrier: No Cultural/Religious Beliefs Affecting Medical Care: No Physical Barrier Impaired Vision: Yes Impaired Hearing: No Decreased Hand dexterity: No Knowledge/Comprehension Knowledge Level: High Comprehension Level: High Ability to understand written instructions: High Ability to understand verbal instructions: High Motivation Anxiety Level: Calm Cooperation: Cooperative Education Importance: Denies Need Interest in Health Problems: Asks Questions Perception: Coherent Willingness to Engage in Self-Management High Activities: Readiness to Engage in Self-Management High Activities: Electronic Signature(s) Signed: 01/22/2021 6:12:45 PM By: Rhae Hammock RN Entered By: Rhae Hammock on 01/22/2021 11:19:34 -------------------------------------------------------------------------------- Fall Risk Assessment Details Patient Name: Date of Service: [name], [name] 01/22/2021 10:30 A M Medical Record Number: [id number] Patient Account Number: [id number] Date of Birth/Sex: Treating RN: [birth year] (70 y.o.  [name], [name] Primary Care Doniven Vanpatten: [name] Other Clinician: Referring Eunice Winecoff: Treating Umeka Wrench/Extender: [place] Clam in Treatment: 0 Fall Risk Assessment Items Have you had 2 or more falls in the last 12 monthso 0 No Have you had any fall that resulted in injury in the last 12 monthso 0 No FALLS RISK SCREEN History of falling - immediate or within 3 months 25 Yes Secondary diagnosis (Do you have 2 or more medical diagnoseso) 0 No Ambulatory aid None/bed rest/wheelchair/nurse 0 No Crutches/cane/walker 0 No Furniture 0 No Intravenous therapy Access/Saline/Heparin Lock 0 No Gait/Transferring Normal/ bed rest/ wheelchair 0 No Weak (  short steps with or without shuffle, stooped but able to lift head while walking, may seek 0 No support from furniture) Impaired (short steps with shuffle, may have difficulty arising from chair, head down, impaired 0 No balance) Mental Status Oriented to own ability 0 No Electronic Signature(s) Signed: 01/22/2021 6:12:45 PM By: Rhae Hammock RN Entered By: Rhae Hammock on 01/22/2021 11:21:48 -------------------------------------------------------------------------------- Foot Assessment Details Patient Name: Date of Service: [name] Hawthorne B. 01/22/2021 10:30 A M Medical Record Number: [id number] Patient Account Number: [id number] Date of Birth/Sex: Treating RN: 05-12-1951 (69 y.o. [name], [name] Primary Care Saraphina Lauderbaugh: [name] Other Clinician: Referring Linette Gunderson: Treating Alexya Mcdaris/Extender: [place] Clam in Treatment: 0 Foot Assessment Items Site Locations + = Sensation present, - = Sensation absent, C = Callus, U = Ulcer R = Redness, W = Warmth, M = Maceration, PU = Pre-ulcerative lesion F = Fissure, S = Swelling, D = Dryness Assessment Right: Left: Other Deformity: No No Prior Foot Ulcer: No No Prior Amputation: No No Charcot Joint: No No Ambulatory  Status: Ambulatory Without Help Gait: Steady Electronic Signature(s) Signed: 01/22/2021 6:12:45 PM By: Rhae Hammock RN Entered By: Rhae Hammock on 01/22/2021 11:38:20 -------------------------------------------------------------------------------- Nutrition Risk Screening Details Patient Name: Date of Service: [name] [name] 01/22/2021 10:30 A M Medical Record Number: [id number] Patient Account Number: [id number] Date of Birth/Sex: Treating RN: 04-19-1951 (70 y.o. [name], [name] Primary Care Gahel Safley: [name] Other Clinician: Referring Lisia Westbay: Treating Margit Batte/Extender: [place] Clam in Treatment: 0 Height (in): 73 Weight (lbs): 222 Body Mass Index (BMI): 29.3 Nutrition Risk Screening Items Score Screening NUTRITION RISK SCREEN: I have an illness or condition that made me change the kind and/or amount of food I eat 0 No I eat fewer than two meals per day 0 No I eat few fruits and vegetables, or milk products 0 No I have three or more drinks of beer, liquor or wine almost every day 0 No I have tooth or mouth problems that make it hard for me to eat 0 No I don't always have enough money to buy the food I need 0 No I eat alone most of the time 0 No I take three or more different prescribed or over-the-counter drugs a day 0 No Without wanting to, I have lost or gained 10 pounds in the last six months 0 No I am not always physically able to shop, cook and/or feed myself 0 No Nutrition Protocols Good Risk Protocol 0 No interventions needed Moderate Risk Protocol High Risk Proctocol Risk Level: Good Risk Score: 0 Electronic Signature(s) Signed: 01/22/2021 6:12:45 PM By: Rhae Hammock RN Entered By: Rhae Hammock on 01/22/2021 11:19:54

## 2021-01-23 DIAGNOSIS — M869 Osteomyelitis, unspecified: Secondary | ICD-10-CM | POA: Diagnosis not present

## 2021-01-23 DIAGNOSIS — E11621 Type 2 diabetes mellitus with foot ulcer: Secondary | ICD-10-CM | POA: Diagnosis not present

## 2021-01-23 NOTE — Progress Notes (Signed)
[name] [name], [name] [name] ([id number]) Visit Report for 01/22/2021 Chief Complaint Document Details Patient Name: Date of Service: [name] [name], [name] [name] 01/22/2021 10:30 A M Medical Record Number: [id number] Patient Account Number: [id number] Date of Birth/Sex: Treating RN: 1950/08/08 (69 y.o. [name] Primary Care Provider: [name] Other Clinician: Referring Provider: Treating Provider/Extender: Lanier Clam in Treatment: 0 Information Obtained from: Patient Chief Complaint 01/22/2021; patient is here for consideration of hyperbaric oxygen therapy for wounds in his right great toe Electronic Signature(s) Signed: 01/22/2021 5:03:28 PM By: Linton Ham MD Entered By: Linton Ham on 01/22/2021 12:51:32 -------------------------------------------------------------------------------- HPI Details Patient Name: Date of Service: [name] Hawthorne B. 01/22/2021 10:30 A M Medical Record Number: [id number] Patient Account Number: [id number] Date of Birth/Sex: Treating RN: 02/06/1951 (70 y.o. [name] Primary Care Provider: [name] Other Clinician: Referring Provider: Treating Provider/Extender: Lanier Clam in Treatment: 0 History of Present Illness HPI Description: ADMISSION 01/22/2021 This is a 70 year old man with type 2 diabetes and diabetic neuropathy on metformin. He has been referred to our clinic from the wound care center in [facility] by Dr. Adelina Mings for consideration of hyperbaric oxygen therapy. Looking through [facility] health link the patient has been followed by Dr. Rocco Serene since the fall of last year at which time he had a Wagner grade 2 wound on the plantar aspect of the right great toe. This deteriorated according to the patient in June when the toe began rapidly more swollen. Ultimately he required admission to hospital from 01/01/2021 through 01/07/2021 at which time he had streaking erythema  up his foot. He had an MRI done on 12/26/2020 which showed medial plantar ulceration of the great toe to bone with diffuse soft tissue swelling of the great toe with enhancement. No fluid collection or hematoma there was osteomyelitis of the first proximal and distal phalanx no abscess. He also had small erosions of the first metatarsal head and first TMT joint suspicious for gout. Cultures grew group B strep and Prevotella. He was treated with IV Invanz and p.o. ciprofloxacin. In fact he was just stopped of the ciprofloxacin by infectious disease today although he remains on IV Invanz until the beginning of August. He is tolerating this well. There is been marked improvement in his wounds and he was referred for consideration of additional hyperbaric oxygen therapy. 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 Surgically removed in 2018 ABIs were done on 05/06/2020 which showed noncompressible waveforms ABIs were biphasic at the ankles bilaterally. TBI's were not done Electronic Signature(s) Signed: 01/22/2021 5:03:28 PM By: Linton Ham MD Entered By: Linton Ham on 01/22/2021 13:02:38 -------------------------------------------------------------------------------- Physical Exam Details Patient Name: Date of Service: [name] Hawthorne B. 01/22/2021 10:30 A M Medical Record Number: [id number] Patient Account Number: [id number] Date of Birth/Sex: Treating RN: April 04, 1951 (70 y.o. [name] Primary Care Provider: [name] Other Clinician: Referring Provider: Treating Provider/Extender: Lanier Clam in Treatment: 0 Constitutional Sitting or standing Blood Pressure is within target range for patient.. Pulse regular and within target  range for patient.Marland Kitchen Respirations regular, non-labored and within target range.. Temperature is normal and within the target range for the patient.Marland Kitchen Appears in no distress. Cardiovascular Both his dorsalis pedis and posterior tibial pulses are palpable on the right. Musculoskeletal No  obvious effusion in the inner phalangeal joint of the right great toe however the toe itself is really quite swollen. No erythema. Neurological Reduced sensation to the monofilament. Notes Wound exam; the patient has 3 open areas on the right great toe 1 dorsally, 1 laterally and 1 medially. All of these have some probing depth. They are very small wounds. None of them reach bone and at 1 point it would not be difficult to believe that these interconnected but they certainly do not do that now She has a new area which is an abrasion on the dorsal right third toe over the DIP this is superficial and happened 2 days ago Electronic Signature(s) Signed: 01/22/2021 5:03:28 PM By: Linton Ham MD Entered By: Linton Ham on 01/22/2021 13:03:01 -------------------------------------------------------------------------------- Physician Orders Details Patient Name: Date of Service: [name] Hawthorne B. 01/22/2021 10:30 A M Medical Record Number: [id number] Patient Account Number: [id number] Date of Birth/Sex: Treating RN: 01-15-1951 (70 y.o. [name] Primary Care Provider: [name] Other Clinician: Referring Provider: Treating Provider/Extender: Lanier Clam in Treatment: 0 Verbal / Phone Orders: No Diagnosis Coding Follow-up Appointments ppointment in 1 week. - with Dr. Dellia Nims Return A Bathing/ Shower/ Hygiene May shower and wash wound with soap and water. - prior to dressing change Off-Loading Open toe surgical shoe to: - right foot Hyperbaric Oxygen Therapy Evaluate for HBO Therapy Indication: - Wagner 3 diabetic ulcer right great toe If appropriate for treatment,  begin HBOT per protocol: 2.5 ATA for 90 Minutes with 2 Five (5) Minute A Breaks ir Total Number of Treatments: - 40 One treatments per day (delivered Monday through Friday unless otherwise specified in Special Instructions below): Finger stick Blood Glucose Pre- and Post- HBOT Treatment. Follow Hyperbaric Oxygen Glycemia Protocol A frin (Oxymetazoline HCL) 0.05% nasal spray - 1 spray in both nostrils daily as needed prior to HBO treatment for difficulty clearing ears Wound Treatment Wound #1 - T Great oe Wound Laterality: Dorsal, Right Cleanser: Soap and Water 1 x Per Day/7 Days Discharge Instructions: May shower and wash wound with dial antibacterial soap and water prior to dressing change. Prim Dressing: Iodoform packing strip 1/4 (in) 1 x Per Day/7 Days ary Discharge Instructions: Lightly pack as instructed Secondary Dressing: Woven Gauze Sponges 2x2 in 1 x Per Day/7 Days Discharge Instructions: Apply over primary dressing as directed. Secured With: Child psychotherapist, Sterile 2x75 (in/in) 1 x Per Day/7 Days Discharge Instructions: Secure with stretch gauze as directed. Secured With: Paper Tape, 1x10 (in/yd) 1 x Per Day/7 Days Discharge Instructions: Secure dressing with tape as directed. Wound #2 - T Great oe Wound Laterality: Right Cleanser: Soap and Water 1 x Per Day/7 Days Discharge Instructions: May shower and wash wound with dial antibacterial soap and water prior to dressing change. Prim Dressing: Iodoform packing strip 1/4 (in) 1 x Per Day/7 Days ary Discharge Instructions: Lightly pack as instructed Secondary Dressing: Woven Gauze Sponges 2x2 in 1 x Per Day/7 Days Discharge Instructions: Apply over primary dressing as directed. Secured With: Child psychotherapist, Sterile 2x75 (in/in) 1 x Per Day/7 Days Discharge Instructions: Secure with stretch gauze as directed. Secured With: Paper Tape, 1x10 (in/yd) 1 x Per Day/7 Days Discharge Instructions:  Secure dressing with tape as directed. Wound #3 - T Great oe Wound Laterality: Right Cleanser: Soap and Water 1 x Per Day/7 Days Discharge Instructions: May shower and wash wound with dial antibacterial soap and water prior to dressing change. Prim Dressing: Iodoform  packing strip 1/4 (in) 1 x Per Day/7 Days ary Discharge Instructions: Lightly pack as instructed Secondary Dressing: Woven Gauze Sponges 2x2 in 1 x Per Day/7 Days Discharge Instructions: Apply over primary dressing as directed. Secured With: Child psychotherapist, Sterile 2x75 (in/in) 1 x Per Day/7 Days Discharge Instructions: Secure with stretch gauze as directed. Secured With: Paper Tape, 1x10 (in/yd) 1 x Per Day/7 Days Discharge Instructions: Secure dressing with tape as directed. Wound #4 - T Third oe Wound Laterality: Right Cleanser: Soap and Water 1 x Per Day/7 Days Discharge Instructions: May shower and wash wound with dial antibacterial soap and water prior to dressing change. Prim Dressing: KerraCel Ag Gelling Fiber Dressing, 2x2 in (silver alginate) 1 x Per Day/7 Days ary Discharge Instructions: Apply silver alginate to wound bed as instructed Prim Dressing: Iodoform packing strip 1/4 (in) 1 x Per Day/7 Days ary Discharge Instructions: Lightly pack as instructed Secondary Dressing: Woven Gauze Sponges 2x2 in 1 x Per Day/7 Days Discharge Instructions: Apply over primary dressing as directed. Secured With: Child psychotherapist, Sterile 2x75 (in/in) 1 x Per Day/7 Days Discharge Instructions: Secure with stretch gauze as directed. Secured With: Paper Tape, 1x10 (in/yd) 1 x Per Day/7 Days Discharge Instructions: Secure dressing with tape as directed. Radiology X-ray, Chest, PA and lateral - Clearance for Hyperbaric Oxygen Therapy GLYCEMIA INTERVENTIONS PROTOCOL PRE-HBO GLYCEMIA INTERVENTIONS ACTION INTERVENTION Obtain pre-HBO capillary blood glucose (ensure 1 physician order is in  chart). A. Notify HBO physician and await physician orders. 2 If result is 70 mg/dl or below: B. If the result meets the hospital definition of a critical result, follow hospital policy. A. Give patient an 8 ounce Glucerna Shake, an 8 ounce Ensure, or 8 ounces of a Glucerna/Ensure equivalent dietary supplement*. B. Wait 30 minutes. If result is 71 mg/dl to 130 mg/dl: C. Retest patients capillary blood glucose (CBG). D. If result greater than or equal to 110 mg/dl, proceed with HBO. If result less than 110 mg/dl, notify HBO physician and consider holding HBO. If result is 131 mg/dl to 249 mg/dl: A. Proceed with HBO. A. Notify HBO physician and await physician orders. B. It is recommended to hold HBO and do If result is 250 mg/dl or greater: blood/urine ketone testing. C. If the result meets the hospital definition of a critical result, follow hospital policy. POST-HBO GLYCEMIA INTERVENTIONS ACTION INTERVENTION Obtain post HBO capillary blood glucose (ensure 1 physician order is in chart). A. Notify HBO physician and await physician orders. 2 If result is 70 mg/dl or below: B. If the result meets the hospital definition of a critical result, follow hospital policy. A. Give patient an 8 ounce Glucerna Shake, an 8 ounce Ensure, or 8 ounces of a Glucerna/Ensure equivalent dietary supplement*. B. Wait 15 minutes for symptoms of If result is 71 mg/dl to 100 mg/dl: hypoglycemia (i.e. nervousness, anxiety, sweating, chills, clamminess, irritability, confusion, tachycardia or dizziness). C. If patient asymptomatic, discharge patient. If patient symptomatic, repeat capillary blood glucose (CBG) and notify HBO physician. If result is 101 mg/dl to 249 mg/dl: A. Discharge patient. A. Notify HBO physician and await physician orders. B. It is recommended to do blood/urine ketone If result is 250 mg/dl or greater: testing. C. If the result meets the hospital definition of  a critical result, follow hospital policy. *Juice or candies are NOT equivalent products. If patient refuses the Glucerna or Ensure, please consult the hospital dietitian for an appropriate substitute. Electronic Signature(s) Signed: 01/22/2021 5:03:28 PM By: Dellia Nims,  Legrand Como MD Signed: 01/23/2021 6:01:10 PM By: Levan Hurst RN, BSN Entered By: Levan Hurst on 01/22/2021 12:18:02 Prescription 01/22/2021 -------------------------------------------------------------------------------- Talbert Forest MD Patient Name: Provider: 1950-08-02 [id number] Date of Birth: NPI#: Jerilynn Mages [id number] Sex: DEA #: [phone] Phone #: License #: [place] Patient Address: [name], [place] [address] [place], [place] [address] [phone] Allergies No Known Allergies Provider's Orders X-ray, Chest, PA and lateral - Clearance for Hyperbaric Oxygen Therapy Hand Signature: Date(s): Electronic Signature(s) Signed: 01/22/2021 5:03:28 PM By: Linton Ham MD Signed: 01/23/2021 6:01:10 PM By: Levan Hurst RN, BSN Entered By: Levan Hurst on 01/22/2021 12:18:02 -------------------------------------------------------------------------------- Problem List Details Patient Name: Date of Service: [name] Hawthorne B. 01/22/2021 10:30 A M Medical Record Number: [id number] Patient Account Number: [id number] Date of Birth/Sex: Treating RN: 07/01/1951 (70 y.o. [name] Primary Care Provider: [name] Other Clinician: Referring Provider: Treating Provider/Extender: Lanier Clam in Treatment: 0 Active Problems ICD-10 Encounter Code Description Active Date MDM Diagnosis E11.621 Type 2 diabetes mellitus with foot ulcer 01/22/2021 No Yes L97.518 Non-pressure chronic ulcer of other part of right foot with other specified 01/22/2021 No Yes severity L97.511  Non-pressure chronic ulcer of other part of right foot limited to breakdown of 01/22/2021 No Yes skin M86.371 Chronic multifocal osteomyelitis, right ankle and foot 01/22/2021 No Yes E11.621 Type 2 diabetes mellitus with foot ulcer 01/22/2021 No Yes Inactive Problems Resolved Problems Electronic Signature(s) Signed: 01/22/2021 5:03:28 PM By: Linton Ham MD Entered By: Linton Ham on 01/22/2021 12:50:10 -------------------------------------------------------------------------------- Progress Note Details Patient Name: Date of Service: [name] Hawthorne B. 01/22/2021 10:30 A M Medical Record Number: [id number] Patient Account Number: [id number] Date of Birth/Sex: Treating RN: 03/23/1951 (70 y.o. [name] Primary Care Provider: [name] Other Clinician: Referring Provider: Treating Provider/Extender: Lanier Clam in Treatment: 0 Subjective Chief Complaint Information obtained from Patient 01/22/2021; patient is here for consideration of hyperbaric oxygen therapy for wounds in his right great toe History of Present Illness (HPI) ADMISSION 01/22/2021 This is a 70 year old man with type 2 diabetes and diabetic neuropathy on metformin. He has been referred to our clinic from the wound care center in [facility] by Dr. Adelina Mings for consideration of hyperbaric oxygen therapy. Looking through [facility] health link the patient has been followed by Dr. Rocco Serene since the fall of last year at which time he had a Wagner grade 2 wound on the plantar aspect of the right great toe. This deteriorated according to the patient in June when the toe began rapidly more swollen. Ultimately he required admission to hospital from 01/01/2021 through 01/07/2021 at which time he had streaking erythema up his foot. He had an MRI done on 12/26/2020 which showed medial plantar ulceration of the great toe to bone with diffuse soft tissue swelling of the great toe with  enhancement. No fluid collection or hematoma there was osteomyelitis of the first proximal and distal phalanx no abscess. He also had small erosions of the first metatarsal head and first TMT joint suspicious for gout. Cultures grew group B strep and Prevotella. He was treated with IV Invanz and p.o. ciprofloxacin. In fact he was just stopped of the ciprofloxacin by infectious disease today although he remains on IV Invanz until the beginning of August. He is tolerating this well. There is been marked improvement in his wounds and he was referred for consideration of additional  hyperbaric oxygen therapy. 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 Surgically removed in 2018 ABIs were done on 05/06/2020 which showed noncompressible waveforms ABIs were biphasic at the ankles bilaterally. TBI's were not done Patient History Information obtained from Patient. Allergies No Known Allergies Family History Unknown History. Social History Never smoker, Marital Status - Married, Alcohol Use - Never, Drug Use - No History, Caffeine Use - Rarely. Medical History Eyes Denies history of Cataracts, Glaucoma, Optic Neuritis Ear/Nose/Mouth/Throat Denies history of Chronic sinus problems/congestion, Middle ear problems Hematologic/Lymphatic Denies history of Anemia, Hemophilia, Human Immunodeficiency Virus, Lymphedema, Sickle Cell Disease Respiratory Denies history of Aspiration, Asthma, Chronic Obstructive Pulmonary Disease (COPD), Pneumothorax, Sleep Apnea, Tuberculosis Cardiovascular Patient has history of Hypertension Denies history of Angina, Arrhythmia, Congestive Heart Failure, Coronary Artery Disease, Deep Vein Thrombosis, Hypotension, Myocardial Infarction, Peripheral  Arterial Disease, Peripheral Venous Disease, Phlebitis, Vasculitis Gastrointestinal Denies history of Cirrhosis , Colitis, Crohnoos, Hepatitis A, Hepatitis B, Hepatitis C Endocrine Patient has history of Type II Diabetes Denies history of Type I Diabetes Genitourinary Denies history of End Stage Renal Disease Immunological Denies history of Lupus Erythematosus, Raynaudoos, Scleroderma Integumentary (Skin) Denies history of History of Burn Musculoskeletal Patient has history of Gout, Osteomyelitis Denies history of Rheumatoid Arthritis, Osteoarthritis Neurologic Patient has history of Neuropathy Denies history of Quadriplegia, Paraplegia, Seizure Disorder Oncologic Denies history of Received Chemotherapy, Received Radiation Patient is treated with Oral Agents. Hospitalization/Surgery History - abdominal surgery. - coronary arter bypass. - right colectomy. Review of Systems (ROS) Constitutional Symptoms (General Health) Denies complaints or symptoms of Fatigue, Fever, Chills, Marked Weight Change. Eyes Denies complaints or symptoms of Dry Eyes, Vision Changes, Glasses / Contacts. Ear/Nose/Mouth/Throat Denies complaints or symptoms of Chronic sinus problems or rhinitis. Respiratory Denies complaints or symptoms of Chronic or frequent coughs, Shortness of Breath. Cardiovascular Denies complaints or symptoms of Chest pain. Gastrointestinal Denies complaints or symptoms of Frequent diarrhea, Nausea, Vomiting. Endocrine Denies complaints or symptoms of Heat/cold intolerance. Genitourinary Denies complaints or symptoms of Frequent urination. Integumentary (Skin) Complains or has symptoms of Wounds. Musculoskeletal Denies complaints or symptoms of Muscle Pain, Muscle Weakness. Neurologic Denies complaints or symptoms of Numbness/parasthesias. Psychiatric Denies complaints or symptoms of Claustrophobia, Suicidal. Objective Constitutional Sitting or standing Blood Pressure is  within target range for patient.. Pulse regular and within target range for patient.Marland Kitchen Respirations regular, non-labored and within target range.. Temperature is normal and within the target range for the patient.Marland Kitchen Appears in no distress. Vitals Time Taken: 11:02 AM, Height: 73 in, Source: Stated, Weight: 222 lbs, Source: Stated, BMI: 29.3, Temperature: 97.9 F, Pulse: 74 bpm, Respiratory Rate: 17 breaths/min, Blood Pressure: 130/74 mmHg. Cardiovascular Both his dorsalis pedis and posterior tibial pulses are palpable on the right. Musculoskeletal No obvious effusion in the inner phalangeal joint of the right great toe however the toe itself is really quite swollen. No erythema. Neurological Reduced sensation to the monofilament. General Notes: Wound exam; the patient has 3 open areas on the right great toe 1 dorsally, 1 laterally and 1 medially. All of these have some probing depth. They are very small wounds. None of them reach bone and at 1 point it would not be difficult to believe that these interconnected but they certainly do not do that now oo She has a new area which is an abrasion on the  dorsal right third toe over the DIP this is superficial and happened 2 days ago Integumentary (Hair, Skin) Wound #1 status is Open. Original cause of wound was Gradually Appeared. The date acquired was: 12/11/2020. The wound is located on the Right,Dorsal T oe Great. The wound measures 0.1cm length x 0.1cm width x 0.5cm depth; 0.008cm^2 area and 0.004cm^3 volume. There is no tunneling or undermining noted. There is a medium amount of serosanguineous drainage noted. The wound margin is distinct with the outline attached to the wound base. There is large (67- 100%) red, pink granulation within the wound bed. There is no necrotic tissue within the wound bed. Wound #2 status is Open. Original cause of wound was Gradually Appeared. The date acquired was: 12/11/2020. The wound is located on the Right T Great.  The oe wound measures 0.2cm length x 0.2cm width x 0.7cm depth; 0.031cm^2 area and 0.022cm^3 volume. There is no tunneling noted, however, there is undermining starting at 12:00 and ending at 12:00 with a maximum distance of 0.5cm. There is a medium amount of serosanguineous drainage noted. The wound margin is distinct with the outline attached to the wound base. There is large (67-100%) red, pink granulation within the wound bed. There is no necrotic tissue within the wound bed. Wound #3 status is Open. Original cause of wound was Gradually Appeared. The date acquired was: 12/11/2020. The wound is located on the Right T Great. The oe wound measures 0.2cm length x 0.2cm width x 0.4cm depth; 0.031cm^2 area and 0.013cm^3 volume. There is no tunneling or undermining noted. There is a medium amount of serosanguineous drainage noted. The wound margin is distinct with the outline attached to the wound base. There is large (67-100%) red, pink granulation within the wound bed. There is no necrotic tissue within the wound bed. Wound #4 status is Open. Original cause of wound was Gradually Appeared. The date acquired was: 12/11/2020. The wound is located on the Right T Third. The oe wound measures 0.2cm length x 0.2cm width x 0.1cm depth; 0.031cm^2 area and 0.003cm^3 volume. There is no tunneling or undermining noted. There is a medium amount of serosanguineous drainage noted. The wound margin is distinct with the outline attached to the wound base. There is no granulation within the wound bed. There is a large (67-100%) amount of necrotic tissue within the wound bed including Eschar and Adherent Slough. Assessment Active Problems ICD-10 Type 2 diabetes mellitus with foot ulcer Non-pressure chronic ulcer of other part of right foot with other specified severity Non-pressure chronic ulcer of other part of right foot limited to breakdown of skin Chronic multifocal osteomyelitis, right ankle and foot Type 2  diabetes mellitus with foot ulcer Plan Follow-up Appointments: Return Appointment in 1 week. - with Dr. Dellia Nims Bathing/ Shower/ Hygiene: May shower and wash wound with soap and water. - prior to dressing change Off-Loading: Open toe surgical shoe to: - right foot Hyperbaric Oxygen Therapy: Evaluate for HBO Therapy Indication: - Wagner 3 diabetic ulcer right great toe If appropriate for treatment, begin HBOT per protocol: 2.5 ATA for 90 Minutes with 2 Five (5) Minute Air Breaks T Number of Treatments: - 40 otal One treatments per day (delivered Monday through Friday unless otherwise specified in Special Instructions below): Finger stick Blood Glucose Pre- and Post- HBOT Treatment. Follow Hyperbaric Oxygen Glycemia Protocol Afrin (Oxymetazoline HCL) 0.05% nasal spray - 1 spray in both nostrils daily as needed prior to HBO treatment for difficulty clearing ears Radiology ordered were: X-ray, Chest,  PA and lateral - Clearance for Hyperbaric Oxygen Therapy WOUND #1: - T Great Wound Laterality: Dorsal, Right oe Cleanser: Soap and Water 1 x Per Day/7 Days Discharge Instructions: May shower and wash wound with dial antibacterial soap and water prior to dressing change. Prim Dressing: Iodoform packing strip 1/4 (in) 1 x Per Day/7 Days ary Discharge Instructions: Lightly pack as instructed Secondary Dressing: Woven Gauze Sponges 2x2 in 1 x Per Day/7 Days Discharge Instructions: Apply over primary dressing as directed. Secured With: Child psychotherapist, Sterile 2x75 (in/in) 1 x Per Day/7 Days Discharge Instructions: Secure with stretch gauze as directed. Secured With: Paper T ape, 1x10 (in/yd) 1 x Per Day/7 Days Discharge Instructions: Secure dressing with tape as directed. WOUND #2: - T Great Wound Laterality: Right oe Cleanser: Soap and Water 1 x Per Day/7 Days Discharge Instructions: May shower and wash wound with dial antibacterial soap and water prior to dressing  change. Prim Dressing: Iodoform packing strip 1/4 (in) 1 x Per Day/7 Days ary Discharge Instructions: Lightly pack as instructed Secondary Dressing: Woven Gauze Sponges 2x2 in 1 x Per Day/7 Days Discharge Instructions: Apply over primary dressing as directed. Secured With: Child psychotherapist, Sterile 2x75 (in/in) 1 x Per Day/7 Days Discharge Instructions: Secure with stretch gauze as directed. Secured With: Paper T ape, 1x10 (in/yd) 1 x Per Day/7 Days Discharge Instructions: Secure dressing with tape as directed. WOUND #3: - T Great Wound Laterality: Right oe Cleanser: Soap and Water 1 x Per Day/7 Days Discharge Instructions: May shower and wash wound with dial antibacterial soap and water prior to dressing change. Prim Dressing: Iodoform packing strip 1/4 (in) 1 x Per Day/7 Days ary Discharge Instructions: Lightly pack as instructed Secondary Dressing: Woven Gauze Sponges 2x2 in 1 x Per Day/7 Days Discharge Instructions: Apply over primary dressing as directed. Secured With: Child psychotherapist, Sterile 2x75 (in/in) 1 x Per Day/7 Days Discharge Instructions: Secure with stretch gauze as directed. Secured With: Paper T ape, 1x10 (in/yd) 1 x Per Day/7 Days Discharge Instructions: Secure dressing with tape as directed. WOUND #4: - T Third Wound Laterality: Right oe Cleanser: Soap and Water 1 x Per Day/7 Days Discharge Instructions: May shower and wash wound with dial antibacterial soap and water prior to dressing change. Prim Dressing: KerraCel Ag Gelling Fiber Dressing, 2x2 in (silver alginate) 1 x Per Day/7 Days ary Discharge Instructions: Apply silver alginate to wound bed as instructed Prim Dressing: Iodoform packing strip 1/4 (in) 1 x Per Day/7 Days ary Discharge Instructions: Lightly pack as instructed Secondary Dressing: Woven Gauze Sponges 2x2 in 1 x Per Day/7 Days Discharge Instructions: Apply over primary dressing as directed. Secured With:  Child psychotherapist, Sterile 2x75 (in/in) 1 x Per Day/7 Days Discharge Instructions: Secure with stretch gauze as directed. Secured With: Paper T ape, 1x10 (in/yd) 1 x Per Day/7 Days Discharge Instructions: Secure dressing with tape as directed. 1. I continued with the endoform packing which is probably the only thing you could get into the small wounds. 2. I certainly agree with hyperbaric oxygen as ancillary treatment to the antibiotics he has been prescribed by infectious disease for underlying osteomyelitis of the distal and proximal phalanx. I would like to do 2.5 atm for 40 treatments with two 5-minute air breaks 3. The toe itself is still quite swollen but nontender. This would suggest continued active infection although by history the wounds are a lot better. 4. I went over hyperbaric oxygen in great  detail including indications, side effects up to including visual refraction, tympanic membrane barotrauma, oxygen toxicity, reduced blood sugar etc. 5. The patient was offered an amputation at some point during the initial part of his hospitalization he is not prepared for that now that things are actually better in terms of the wound I spent 45 minutes in review of this patient's past medical history, face-to-face evaluation and preparation of this record Electronic Signature(s) Signed: 01/22/2021 1:03:40 PM By: Linton Ham MD Entered By: Linton Ham on 01/22/2021 13:03:40 -------------------------------------------------------------------------------- HxROS Details Patient Name: Date of Service: [name] Hawthorne B. 01/22/2021 10:30 A M Medical Record Number: [id number] Patient Account Number: [id number] Date of Birth/Sex: Treating RN: [birth year] (70 y.o. [name], [name] Primary Care Provider: [name] Other Clinician: Referring Provider: Treating Provider/Extender: Lanier Clam in Treatment: 0 Information Obtained  From Patient Constitutional Symptoms (General Health) Complaints and Symptoms: Negative for: Fatigue; Fever; Chills; Marked Weight Change Eyes Complaints and Symptoms: Negative for: Dry Eyes; Vision Changes; Glasses / Contacts Medical History: Negative for: Cataracts; Glaucoma; Optic Neuritis Ear/Nose/Mouth/Throat Complaints and Symptoms: Negative for: Chronic sinus problems or rhinitis Medical History: Negative for: Chronic sinus problems/congestion; Middle ear problems Respiratory Complaints and Symptoms: Negative for: Chronic or frequent coughs; Shortness of Breath Medical History: Negative for: Aspiration; Asthma; Chronic Obstructive Pulmonary Disease (COPD); Pneumothorax; Sleep Apnea; Tuberculosis Cardiovascular Complaints and Symptoms: Negative for: Chest pain Medical History: Positive for: Hypertension Negative for: Angina; Arrhythmia; Congestive Heart Failure; Coronary Artery Disease; Deep Vein Thrombosis; Hypotension; Myocardial Infarction; Peripheral Arterial Disease; Peripheral Venous Disease; Phlebitis; Vasculitis Gastrointestinal Complaints and Symptoms: Negative for: Frequent diarrhea; Nausea; Vomiting Medical History: Negative for: Cirrhosis ; Colitis; Crohns; Hepatitis A; Hepatitis B; Hepatitis C Endocrine Complaints and Symptoms: Negative for: Heat/cold intolerance Medical History: Positive for: Type II Diabetes Negative for: Type I Diabetes Treated with: Oral agents Genitourinary Complaints and Symptoms: Negative for: Frequent urination Medical History: Negative for: End Stage Renal Disease Integumentary (Skin) Complaints and Symptoms: Positive for: Wounds Medical History: Negative for: History of Burn Musculoskeletal Complaints and Symptoms: Negative for: Muscle Pain; Muscle Weakness Medical History: Positive for: Gout; Osteomyelitis Negative for: Rheumatoid Arthritis; Osteoarthritis Neurologic Complaints and Symptoms: Negative for:  Numbness/parasthesias Medical History: Positive for: Neuropathy Negative for: Quadriplegia; Paraplegia; Seizure Disorder Psychiatric Complaints and Symptoms: Negative for: Claustrophobia; Suicidal Hematologic/Lymphatic Medical History: Negative for: Anemia; Hemophilia; Human Immunodeficiency Virus; Lymphedema; Sickle Cell Disease Immunological Medical History: Negative for: Lupus Erythematosus; Raynauds; Scleroderma Oncologic Medical History: Negative for: Received Chemotherapy; Received Radiation Immunizations Pneumococcal Vaccine: Received Pneumococcal Vaccination: Yes Implantable Devices None Hospitalization / Surgery History Type of Hospitalization/Surgery abdominal surgery coronary arter bypass right colectomy Family and Social History Unknown History: Yes; Never smoker; Marital Status - Married; Alcohol Use: Never; Drug Use: No History; Caffeine Use: Rarely; Financial Concerns: No; Food, Clothing or Shelter Needs: No; Support System Lacking: No; Transportation Concerns: No Electronic Signature(s) Signed: 01/22/2021 5:03:28 PM By: Linton Ham MD Signed: 01/22/2021 6:12:45 PM By: Rhae Hammock RN Entered By: Rhae Hammock on 01/22/2021 11:17:41 -------------------------------------------------------------------------------- SuperBill Details Patient Name: Date of Service: [name] [name] 01/22/2021 Medical Record Number: [id number] Patient Account Number: [id number] Date of Birth/Sex: Treating RN: [birth year] (70 y.o. [name] Primary Care Provider: [name] Other Clinician: Referring Provider: Treating Provider/Extender: Lanier Clam in Treatment: 0 Diagnosis Coding ICD-10 Codes Code Description [id number] Type 2 diabetes mellitus with foot ulcer L97.518 Non-pressure chronic ulcer of other part of right foot with other specified severity L97.511 Non-pressure chronic ulcer  of other part of right foot limited to  breakdown of skin M86.371 Chronic multifocal osteomyelitis, right ankle and foot E11.621 Type 2 diabetes mellitus with foot ulcer Facility Procedures CPT4 Code: [id number] Description: [phone] - WOUND CARE VISIT-LEV 5 EST PT Modifier: Quantity: 1 Physician Procedures : CPT4 Code Description Modifier [id number] - WC PHYS LEVEL 4 - NEW PT ICD-10 Diagnosis Description E11.621 Type 2 diabetes mellitus with foot ulcer L97.518 Non-pressure chronic ulcer of other part of right foot with other specified severity L97.511  Non-pressure chronic ulcer of other part of right foot limited to breakdown of skin M86.371 Chronic multifocal osteomyelitis, right ankle and foot Quantity: 1 Electronic Signature(s) Signed: 01/22/2021 5:03:28 PM By: Linton Ham MD Signed: 01/23/2021 6:01:10 PM By: Levan Hurst RN, BSN Entered By: Levan Hurst on 01/22/2021 15:32:43

## 2021-01-23 NOTE — Progress Notes (Signed)
HASSELL, PATRAS ([id number]) Visit Report for 01/22/2021 Allergy List Details Patient Name: Date of Service: [name], [name] 01/22/2021 10:30 A M Medical Record Number: [id number] Patient Account Number: [id number] Date of Birth/Sex: Treating RN: 26-Aug-1950 (70 y.o. [name] [name], [name] [name] Lilac Hoff: [name] Other Clinician: Referring Marquisa Salih: Treating Marcin Holte/Extender: Lanier Clam in Treatment: 0 Allergies Active Allergies No Known Allergies Allergy Notes Electronic Signature(s) Signed: 01/22/2021 6:12:45 PM By: Rhae Hammock RN Entered By: Rhae Hammock on 01/22/2021 11:02:50 -------------------------------------------------------------------------------- Arrival Information Details Patient Name: Date of Service: [name] Hawthorne B. 01/22/2021 10:30 A M Medical Record Number: [id number] Patient Account Number: [id number] Date of Birth/Sex: Treating RN: 04-Mar-1951 (69 y.o. [name] [name], [name] [name] Linetta Regner: [name] Other Clinician: Referring Pallas Wahlert: Treating Taedyn Glasscock/Extender: Lanier Clam in Treatment: 0 Visit Information Patient Arrived: Ambulatory Arrival Time: 10:59 Accompanied By: daughter Transfer Assistance: None Patient Identification Verified: Yes Secondary Verification Process Completed: Yes Patient Requires Transmission-Based Precautions: No Patient Has Alerts: Yes Patient Alerts: R ABI non compressible L ABI non compressible Electronic Signature(s) Signed: 01/23/2021 6:01:10 PM By: Levan Hurst RN, BSN Entered By: Levan Hurst on 01/22/2021 17:16:23 -------------------------------------------------------------------------------- Clinic Level of [name] Assessment Details Patient Name: Date of Service: [name], [name] 01/22/2021 10:30 A M Medical Record Number: [id number] Patient Account Number: [id number] Date of Birth/Sex: Treating RN: 09-29-1950 (70 y.o. [name] [name] Primary [name] Kalib Bhagat: [name] Other Clinician: Referring Natalija Mavis: Treating Alley Neils/Extender: Lanier Clam in Treatment: 0 Clinic Level of [name] Assessment Items TOOL 2 Quantity Score X- 1 0 Use when only an EandM is performed on the INITIAL visit ASSESSMENTS - Nursing Assessment / Reassessment X- 1 20 General Physical Exam (combine w/ comprehensive assessment (listed just below) when performed on new pt. evals) X- 1 25 Comprehensive Assessment (HX, ROS, Risk Assessments, Wounds Hx, etc.) ASSESSMENTS - Wound and Skin A ssessment / Reassessment []  - 0 Simple Wound Assessment / Reassessment - one wound X- 4 5 Complex Wound Assessment / Reassessment - multiple wounds []  - 0 Dermatologic / Skin Assessment (not related to wound area) ASSESSMENTS - Ostomy and/or Continence Assessment and [name] []  - 0 Incontinence Assessment and Management []  - 0 Ostomy [name] Assessment and Management (repouching, etc.) PROCESS - Coordination of [name] X - Simple Patient / Family Education for ongoing [name] 1 15 []  - 0 Complex (extensive) Patient / Family Education for ongoing [name] X- 1 10 Staff obtains Programmer, systems, Records, T Results / Process Orders est []  - 0 Staff telephones HHA, Nursing Homes / Clarify orders / etc []  - 0 Routine Transfer to another Facility (non-emergent condition) X- 1 10 Routine Hospital Admission (non-emergent condition) []  - 0 New Admissions / Biomedical engineer / Ordering NPWT Apligraf, etc. , []  - 0 Emergency Hospital Admission (emergent condition) X- 1 10 Simple Discharge Coordination []  - 0 Complex (extensive) Discharge Coordination PROCESS - Special Needs []  - 0 Pediatric / Minor Patient Management []  - 0 Isolation Patient Management []  - 0 Hearing / Language / Visual special needs []  - 0 Assessment of Community assistance (transportation, D/C planning, etc.) []  - 0 Additional assistance / Altered  mentation []  - 0 Support Surface(s) Assessment (bed, cushion, seat, etc.) INTERVENTIONS - Wound Cleansing / Measurement X- 1 5 Wound Imaging (photographs - any number of wounds) []  - 0 Wound Tracing (instead of photographs) []  - 0 Simple Wound Measurement - one wound X- 4 5 Complex Wound Measurement - multiple wounds []  -  0 Simple Wound Cleansing - one wound X- 4 5 Complex Wound Cleansing - multiple wounds INTERVENTIONS - Wound Dressings X - Small Wound Dressing one or multiple wounds 2 10 []  - 0 Medium Wound Dressing one or multiple wounds []  - 0 Large Wound Dressing one or multiple wounds []  - 0 Application of Medications - injection INTERVENTIONS - Miscellaneous []  - 0 External ear exam []  - 0 Specimen Collection (cultures, biopsies, blood, body fluids, etc.) []  - 0 Specimen(s) / Culture(s) sent or taken to Lab for analysis []  - 0 Patient Transfer (multiple staff / Harrel Lemon Lift / Similar devices) []  - 0 Simple Staple / Suture removal (25 or less) []  - 0 Complex Staple / Suture removal (26 or more) []  - 0 Hypo / Hyperglycemic Management (close monitor of Blood Glucose) X- 1 15 Ankle / Brachial Index (ABI) - do not check if billed separately Has the patient been seen at the hospital within the last three years: Yes Total Score: 190 Level Of [name]: New/Established - Level 5 Electronic Signature(s) Signed: 01/23/2021 6:01:10 PM By: Levan Hurst RN, BSN Entered By: Levan Hurst on 01/22/2021 15:32:33 -------------------------------------------------------------------------------- Encounter Discharge Information Details Patient Name: Date of Service: [name] Hawthorne B. 01/22/2021 10:30 A M Medical Record Number: [id number] Patient Account Number: [id number] Date of Birth/Sex: Treating RN: [birth year] (70 y.o. [name] [name] Primary [name] Asli Tokarski: [name] Other Clinician: Referring Charletha Dalpe: Treating Zamariya Neal/Extender: Lanier Clam  in Treatment: 0 Encounter Discharge Information Items Discharge Condition: Stable Ambulatory Status: Ambulatory Discharge Destination: Home Transportation: Private Auto Accompanied By: daughter Schedule Follow-up Appointment: Yes Clinical Summary of [name]: Patient Declined Electronic Signature(s) Signed: 01/23/2021 6:01:10 PM By: Levan Hurst RN, BSN Entered By: Levan Hurst on 01/22/2021 15:33:21 -------------------------------------------------------------------------------- Lower Extremity Assessment Details Patient Name: Date of Service: [name] Hawthorne B. 01/22/2021 10:30 A M Medical Record Number: [id number] Patient Account Number: [id number] Date of Birth/Sex: Treating RN: [birth year] (70 y.o. [name] [name], [name] [name] Soledad Budreau: [name] Other Clinician: Referring Jayden Kratochvil: Treating Saylah Ketner/Extender: Lanier Clam in Treatment: 0 Edema Assessment Assessed: Shirlyn Goltz: No] Patrice Paradise: Yes] Edema: [Left: Ye] [Right: s] Calf Left: Right: Point of Measurement: From Medial Instep 34 cm Ankle Left: Right: Point of Measurement: From Medial Instep 22 cm Vascular Assessment Pulses: Dorsalis Pedis Palpable: [Right:Yes] Posterior Tibial Palpable: [Right:Yes] Electronic Signature(s) Signed: 01/22/2021 6:12:45 PM By: Rhae Hammock RN Entered By: Rhae Hammock on 01/22/2021 11:41:44 -------------------------------------------------------------------------------- Multi Wound Chart Details Patient Name: Date of Service: [name] Hawthorne B. 01/22/2021 10:30 A M Medical Record Number: [id number] Patient Account Number: [id number] Date of Birth/Sex: Treating RN: 10-16-1950 (70 y.o. [name] [name] Primary [name] Jonel Weldon: [name] Other Clinician: Referring Mechele Kittleson: Treating Iqra Rotundo/Extender: Lanier Clam in Treatment: 0 Vital Signs Height(in): 45 Pulse(bpm): 37 Weight(lbs): 35 Blood Pressure(mmHg):  130/74 Body Mass Index(BMI): 29 Temperature(F): 97.9 Respiratory Rate(breaths/min): 17 Photos: [1:No Photos Right, Dorsal T Great oe] [2:No Photos Right T Great oe] [3:No Photos Right T Great oe] Wound Location: [1:Gradually Appeared] [2:Gradually Appeared] [3:Gradually Appeared] Wounding Event: [1:Diabetic Wound/Ulcer of the Lower] [2:Diabetic Wound/Ulcer of the Lower] [3:Diabetic Wound/Ulcer of the Lower] Primary Etiology: [1:Extremity Hypertension, Type II Diabetes, Gout,] [2:Extremity Hypertension, Type II Diabetes, Gout,] [3:Extremity Hypertension, Type II Diabetes, Gout,] Comorbid History: [1:Osteomyelitis, Neuropathy 12/11/2020] [2:Osteomyelitis, Neuropathy 12/11/2020] [3:Osteomyelitis, Neuropathy 12/11/2020] Date Acquired: [1:0] [2:0] [3:0] Weeks of Treatment: [1:Open] [2:Open] [3:Open] Wound Status: [1:0.1x0.1x0.5] [2:0.2x0.2x0.7] [3:0.2x0.2x0.4] Measurements L x W x D (cm) [1:0.008] [2:0.031] [3:0.031] A (cm) :  rea [1:0.004] [2:0.022] [3:0.013] Volume (cm) : [2:12] Starting Position 1 (o'clock): [2:12] Ending Position 1 (o'clock): [2:0.5] Maximum Distance 1 (cm): [1:No] [2:Yes] [3:No] Undermining: [1:Grade 3] [2:Grade 3] [3:Grade 3] Classification: [1:MRI] [2:MRI] [3:MRI] Wagner Verification: [1:Medium] [2:Medium] [3:Medium] Exudate A mount: [1:Serosanguineous] [2:Serosanguineous] [3:Serosanguineous] Exudate Type: [1:red, brown] [2:red, brown] [3:red, brown] Exudate Color: [1:Distinct, outline attached] [2:Distinct, outline attached] [3:Distinct, outline attached] Wound Margin: [1:Large (67-100%)] [2:Large (67-100%)] [3:Large (67-100%)] Granulation Amount: [1:Red, Pink] [2:Red, Pink] [3:Red, Pink] Granulation Quality: [1:None Present (0%)] [2:None Present (0%)] [3:None Present (0%)] Necrotic Amount: [1:N/A] [2:N/A] [3:N/A] Necrotic Tissue: [1:Fascia: No] [2:Fascia: No] [3:Fascia: No] Exposed Structures: [1:Fat Layer (Subcutaneous Tissue): No Tendon: No Muscle: No Joint: No  Bone: No None] [2:Fat Layer (Subcutaneous Tissue): No Tendon: No Muscle: No Joint: No Bone: No N/A] [3:Fat Layer (Subcutaneous Tissue): No Tendon: No Muscle: No Joint: No Bone: No  None] Wound Number: 4 N/A N/A Photos: No Photos N/A N/A Right T Third oe N/A N/A Wound Location: Gradually Appeared N/A N/A Wounding Event: Diabetic Wound/Ulcer of the Lower N/A N/A Primary Etiology: Extremity Hypertension, Type II Diabetes, Gout, N/A N/A Comorbid History: Osteomyelitis, Neuropathy 12/11/2020 N/A N/A Date Acquired: 0 N/A N/A Weeks of Treatment: Open N/A N/A Wound Status: 0.2x0.2x0.1 N/A N/A Measurements L x W x D (cm) 0.031 N/A N/A A (cm) : rea 0.003 N/A N/A Volume (cm) : No N/A N/A Undermining: Grade 2 N/A N/A Classification: N/A N/A N/A Earleen Newport Verification: Medium N/A N/A Exudate A mount: Serosanguineous N/A N/A Exudate Type: red, brown N/A N/A Exudate Color: Distinct, outline attached N/A N/A Wound Margin: None Present (0%) N/A N/A Granulation A mount: N/A N/A N/A Granulation Quality: Large (67-100%) N/A N/A Necrotic A mount: Eschar, Adherent Slough N/A N/A Necrotic Tissue: Fascia: No N/A N/A Exposed Structures: Fat Layer (Subcutaneous Tissue): No Tendon: No Muscle: No Joint: No Bone: No None N/A N/A Epithelialization: Treatment Notes Electronic Signature(s) Signed: 01/22/2021 5:03:28 PM By: Linton Ham MD Signed: 01/23/2021 6:01:10 PM By: Levan Hurst RN, BSN Entered By: Linton Ham on 01/22/2021 12:51:03 -------------------------------------------------------------------------------- Multi-Disciplinary [name] Plan Details Patient Name: Date of Service: [name] Hawthorne B. 01/22/2021 10:30 A M Medical Record Number: [id number] Patient Account Number: [id number] Date of Birth/Sex: Treating RN: 09-Apr-1951 (70 y.o. [name] [name] Primary [name] Desirea Mizrahi: [name] Other Clinician: Referring Urbano Milhouse: Treating Marnell Mcdaniel/Extender: Lanier Clam in Treatment: 0 Multidisciplinary [name] Plan reviewed with physician Active Inactive HBO Nursing Diagnoses: Anxiety related to feelings of confinement associated with the hyperbaric oxygen chamber Anxiety related to knowledge deficit of hyperbaric oxygen therapy and treatment procedures Discomfort related to temperature and humidity changes inside hyperbaric chamber Potential for barotraumas to ears, sinuses, teeth, and lungs or cerebral gas embolism related to changes in atmospheric pressure inside hyperbaric oxygen chamber Potential for oxygen toxicity seizures related to delivery of 100% oxygen at an increased atmospheric pressure Potential for pulmonary oxygen toxicity related to delivery of 100% oxygen at an increased atmospheric pressure Goals: Barotrauma will be prevented during HBO2 Date Initiated: 01/22/2021 T arget Resolution Date: 03/21/2021 Goal Status: Active Patient and/or family will be able to state/discuss factors appropriate to the management of their disease process during treatment Date Initiated: 01/22/2021 T arget Resolution Date: 03/21/2021 Goal Status: Active Patient will tolerate the hyperbaric oxygen therapy treatment Date Initiated: 01/22/2021 T arget Resolution Date: 03/21/2021 Goal Status: Active Patient will tolerate the internal climate of the chamber Date Initiated: 01/22/2021 T arget Resolution Date: 03/21/2021 Goal Status: Active Patient/caregiver will verbalize understanding  of HBO goals, rationale, procedures and potential hazards Date Initiated: 01/22/2021 T arget Resolution Date: 03/21/2021 Goal Status: Active Signs and symptoms of pulmonary oxygen toxicity will be recognized and promptly addressed Date Initiated: 01/22/2021 T arget Resolution Date: 03/21/2021 Goal Status: Active Signs and symptoms of seizure will be recognized and promptly addressed ; seizing patients will suffer no harm Date Initiated: 01/22/2021 T arget  Resolution Date: 03/21/2021 Goal Status: Active Interventions: Administer a five (5) minute air break for patient if signs and symptoms of seizure appear and notify the hyperbaric physician Administer decongestants, per physician orders, prior to HBO2 Administer the correct therapeutic gas delivery based on the patients needs and limitations, per physician order Assess and provide for patients comfort related to the hyperbaric environment and equalization of middle ear Assess for signs and symptoms related to adverse events, including but not limited to confinement anxiety, pneumothorax, oxygen toxicity and baurotrauma Assess patient for any history of confinement anxiety Assess patient's knowledge and expectations regarding hyperbaric medicine and provide education related to the hyperbaric environment, goals of treatment and prevention of adverse events Implement protocols to decrease risk of pneumothorax in high risk patients Notes: Nutrition Nursing Diagnoses: Impaired glucose control: actual or potential Potential for alteratiion in Nutrition/Potential for imbalanced nutrition Goals: Patient/caregiver agrees to and verbalizes understanding of need to use nutritional supplements and/or vitamins as prescribed Date Initiated: 01/22/2021 Target Resolution Date: 02/21/2021 Goal Status: Active Patient/caregiver will maintain therapeutic glucose control Date Initiated: 01/22/2021 Target Resolution Date: 02/21/2021 Goal Status: Active Interventions: Assess HgA1c results as ordered upon admission and as needed Assess patient nutrition upon admission and as needed per policy Provide education on elevated blood sugars and impact on wound healing Provide education on nutrition Notes: Wound/Skin Impairment Nursing Diagnoses: Impaired tissue integrity Knowledge deficit related to ulceration/compromised skin integrity Goals: Patient/caregiver will verbalize understanding of skin [name]  regimen Date Initiated: 01/22/2021 Target Resolution Date: 02/21/2021 Goal Status: Active Interventions: Assess patient/caregiver ability to obtain necessary supplies Assess patient/caregiver ability to perform ulcer/skin [name] regimen upon admission and as needed Assess ulceration(s) every visit Provide education on ulcer and skin [name] Notes: Electronic Signature(s) Signed: 01/23/2021 6:01:10 PM By: Levan Hurst RN, BSN Entered By: Levan Hurst on 01/22/2021 15:31:30 -------------------------------------------------------------------------------- Pain Assessment Details Patient Name: Date of Service: [name] Hawthorne B. 01/22/2021 10:30 A M Medical Record Number: [id number] Patient Account Number: [id number] Date of Birth/Sex: Treating RN: [birth year] (70 y.o. [name] [name], [name] [name] Alarik Radu: [name] Other Clinician: Referring Marlyne Totaro: Treating Ameera Tigue/Extender: Lanier Clam in Treatment: 0 Active Problems Location of Pain Severity and Description of Pain Patient Has Paino No Site Locations Pain Management and Medication Current Pain Management: Electronic Signature(s) Signed: 01/22/2021 6:12:45 PM By: Rhae Hammock RN Entered By: Rhae Hammock on 01/22/2021 11:21:56 -------------------------------------------------------------------------------- Patient/Caregiver Education Details Patient Name: Date of Service: [name] 7/13/2022andnbsp10:30 A M Medical Record Number: [id number] Patient Account Number: [id number] Date of Birth/Gender: Treating RN: July 09, 1951 (70 y.o. [name] [name] Primary [name] Physician: [name] Other Clinician: Referring Physician: Treating Physician/Extender: Lanier Clam in Treatment: 0 Education Assessment Education Provided To: Patient Education Topics Provided Elevated Blood Sugar/ Impact on Healing: Methods: Explain/Verbal Responses: State  content correctly Nutrition: Methods: Explain/Verbal Responses: State content correctly Wound/Skin Impairment: Methods: Explain/Verbal Responses: State content correctly Electronic Signature(s) Signed: 01/23/2021 6:01:10 PM By: Levan Hurst RN, BSN Entered By: Levan Hurst on 01/22/2021 15:31:43 -------------------------------------------------------------------------------- Wound Assessment Details Patient Name: Date of Service: Weitzel, [name] 01/22/2021  10:30 A M Medical Record Number: [id number] Patient Account Number: [id number] Date of Birth/Sex: Treating RN: 17-Sep-1950 (70 y.o. [name] [name], [name] [name] Adjoa Althouse: [name] Other Clinician: Referring Giles Currie: Treating Jimmye Wisnieski/Extender: Lanier Clam in Treatment: 0 Wound Status Wound Number: 1 Primary Diabetic Wound/Ulcer of the Lower Extremity Etiology: Wound Location: Right, Dorsal T Great oe Wound Status: Open Wounding Event: Gradually Appeared Comorbid Hypertension, Type II Diabetes, Gout, Osteomyelitis, Date Acquired: 12/11/2020 History: Neuropathy Weeks Of Treatment: 0 Clustered Wound: No Photos Photo Uploaded By: Sandre Kitty on 01/23/2021 08:33:27 Wound Measurements Length: (cm) 0.1 Width: (cm) 0.1 Depth: (cm) 0.5 Area: (cm) 0.008 Volume: (cm) 0.004 Wound Description Classification: Grade 3 Wagner Verification: MRI Wound Margin: Distinct, outline attached Exudate Amount: Medium Exudate Type: Serosanguineous Exudate Color: red, brown Foul Odor After Cleansing: % Reduction in Area: % Reduction in Volume: Epithelialization: None Tunneling: No Undermining: No No Wound Bed Granulation Amount: Large (67-100%) Exposed Structure Granulation Quality: Red, Pink Fascia Exposed: No Necrotic Amount: None Present (0%) Fat Layer (Subcutaneous Tissue) Exposed: No Tendon Exposed: No Muscle Exposed: No Joint Exposed: No Bone Exposed: No Treatment  Notes Wound #1 (Toe Great) Wound Laterality: Dorsal, Right Cleanser Soap and Water Discharge Instruction: May shower and wash wound with dial antibacterial soap and water prior to dressing change. Peri-Wound [name] Topical Primary Dressing Iodoform packing strip 1/4 (in) Discharge Instruction: Lightly pack as instructed Secondary Dressing Woven Gauze Sponges 2x2 in Discharge Instruction: Apply over primary dressing as directed. Secured With Conforming Stretch Gauze Bandage, Sterile 2x75 (in/in) Discharge Instruction: Secure with stretch gauze as directed. Paper Tape, 1x10 (in/yd) Discharge Instruction: Secure dressing with tape as directed. Compression Wrap Compression Stockings Add-Ons Electronic Signature(s) Signed: 01/22/2021 6:12:45 PM By: Rhae Hammock RN Entered By: Rhae Hammock on 01/22/2021 11:52:58 -------------------------------------------------------------------------------- Wound Assessment Details Patient Name: Date of Service: [name] Hawthorne B. 01/22/2021 10:30 A M Medical Record Number: [id number] Patient Account Number: [id number] Date of Birth/Sex: Treating RN: March 24, 1951 (70 y.o. [name] [name], [name] [name] Theophile Harvie: [name] Other Clinician: Referring Suhani Stillion: Treating Kendyn Zaman/Extender: Lanier Clam in Treatment: 0 Wound Status Wound Number: 2 Primary Diabetic Wound/Ulcer of the Lower Extremity Etiology: Wound Location: Right T Great oe Wound Status: Open Wounding Event: Gradually Appeared Comorbid Hypertension, Type II Diabetes, Gout, Osteomyelitis, Date Acquired: 12/11/2020 Date Acquired: 12/11/2020 History: Neuropathy Weeks Of Treatment: 0 Clustered Wound: No Photos Photo Uploaded By: Sandre Kitty on 01/23/2021 08:33:27 Wound Measurements Length: (cm) 0.2 Width: (cm) 0.2 Depth: (cm) 0.7 Area: (cm) 0.031 Volume: (cm) 0.022 % Reduction in Area: % Reduction in Volume: Tunneling:  No Undermining: Yes Starting Position (o'clock): 12 Ending Position (o'clock): 12 Maximum Distance: (cm) 0.5 Wound Description Classification: Grade 3 Wagner Verification: MRI Wound Margin: Distinct, outline attached Exudate Amount: Medium Exudate Type: Serosanguineous Exudate Color: red, brown Foul Odor After Cleansing: No Slough/Fibrino No Wound Bed Granulation Amount: Large (67-100%) Exposed Structure Granulation Quality: Red, Pink Fascia Exposed: No Necrotic Amount: None Present (0%) Fat Layer (Subcutaneous Tissue) Exposed: No Tendon Exposed: No Muscle Exposed: No Joint Exposed: No Bone Exposed: No Treatment Notes Wound #2 (Toe Great) Wound Laterality: Right Cleanser Soap and Water Discharge Instruction: May shower and wash wound with dial antibacterial soap and water prior to dressing change. Peri-Wound [name] Topical Primary Dressing Iodoform packing strip 1/4 (in) Discharge Instruction: Lightly pack as instructed Secondary Dressing Woven Gauze Sponges 2x2 in Discharge Instruction: Apply over primary dressing as directed. Secured With Conforming Stretch Gauze Bandage, Sterile 2x75 (in/in) Discharge  Instruction: Secure with stretch gauze as directed. Paper Tape, 1x10 (in/yd) Discharge Instruction: Secure dressing with tape as directed. Compression Wrap Compression Stockings Add-Ons Electronic Signature(s) Signed: 01/22/2021 6:12:45 PM By: Rhae Hammock RN Entered By: Rhae Hammock on 01/22/2021 12:00:13 -------------------------------------------------------------------------------- Wound Assessment Details Patient Name: Date of Service: [name] Hawthorne B. 01/22/2021 10:30 A M Medical Record Number: [id number] Patient Account Number: [id number] Date of Birth/Sex: Treating RN: 1951/07/08 (70 y.o. [name] [name], [name] [name] Bastion Bolger: [name] Other Clinician: Referring Daviana Haymaker: Treating Izumi Mixon/Extender: Lanier Clam in Treatment: 0 Wound Status Wound Number: 3 Primary Diabetic Wound/Ulcer of the Lower Extremity Etiology: Wound Location: Right T Great oe Wound Status: Open Wounding Event: Gradually Appeared Comorbid Hypertension, Type II Diabetes, Gout, Osteomyelitis, Date Acquired: 12/11/2020 History: Neuropathy Weeks Of Treatment: 0 Clustered Wound: No Photos Photo Uploaded By: Sandre Kitty on 01/23/2021 08:32:43 Wound Measurements Length: (cm) 0.2 Width: (cm) 0.2 Depth: (cm) 0.4 Area: (cm) 0.031 Volume: (cm) 0.013 % Reduction in Area: % Reduction in Volume: Epithelialization: None Tunneling: No Undermining: No Wound Description Classification: Grade 3 Wagner Verification: MRI Wound Margin: Distinct, outline attached Exudate Amount: Medium Exudate Type: Serosanguineous Exudate Color: red, brown Foul Odor After Cleansing: No Slough/Fibrino No Wound Bed Granulation Amount: Large (67-100%) Exposed Structure Granulation Quality: Red, Pink Fascia Exposed: No Necrotic Amount: None Present (0%) Fat Layer (Subcutaneous Tissue) Exposed: No Tendon Exposed: No Muscle Exposed: No Joint Exposed: No Bone Exposed: No Treatment Notes Wound #3 (Toe Great) Wound Laterality: Right Cleanser Soap and Water Discharge Instruction: May shower and wash wound with dial antibacterial soap and water prior to dressing change. Peri-Wound [name] Topical Primary Dressing Iodoform packing strip 1/4 (in) Discharge Instruction: Lightly pack as instructed Secondary Dressing Woven Gauze Sponges 2x2 in Discharge Instruction: Apply over primary dressing as directed. Secured With Conforming Stretch Gauze Bandage, Sterile 2x75 (in/in) Discharge Instruction: Secure with stretch gauze as directed. Paper Tape, 1x10 (in/yd) Discharge Instruction: Secure dressing with tape as directed. Compression Wrap Compression Stockings Add-Ons Electronic Signature(s) Signed: 01/22/2021 6:12:45 PM By:  Rhae Hammock RN Entered By: Rhae Hammock on 01/22/2021 12:01:17 -------------------------------------------------------------------------------- Wound Assessment Details Patient Name: Date of Service: [name] Hawthorne B. 01/22/2021 10:30 A M Medical Record Number: [id number] Patient Account Number: [id number] Date of Birth/Sex: Treating RN: [birth year] (70 y.o. [name] [name], [name] [name] Sherisa Gilvin: [name] Other Clinician: Referring Shakelia Scrivner: Treating Marshay Slates/Extender: Lanier Clam in Treatment: 0 Wound Status Wound Number: 4 Primary Diabetic Wound/Ulcer of the Lower Extremity Etiology: Wound Location: Right T Third oe Wound Status: Open Wounding Event: Gradually Appeared Comorbid Hypertension, Type II Diabetes, Gout, Osteomyelitis, Date Acquired: 12/11/2020 History: Neuropathy Weeks Of Treatment: 0 Clustered Wound: No Photos Photo Uploaded By: Sandre Kitty on 01/23/2021 08:32:43 Wound Measurements Length: (cm) 0.2 Width: (cm) 0.2 Depth: (cm) 0.1 Area: (cm) 0.031 Volume: (cm) 0.003 % Reduction in Area: % Reduction in Volume: Epithelialization: None Tunneling: No Undermining: No Wound Description Classification: Grade 2 Wound Margin: Distinct, outline attached Exudate Amount: Medium Exudate Type: Serosanguineous Exudate Color: red, brown Foul Odor After Cleansing: No Slough/Fibrino Yes Wound Bed Granulation Amount: None Present (0%) Exposed Structure Necrotic Amount: Large (67-100%) Fascia Exposed: No Necrotic Quality: Eschar, Adherent Slough Fat Layer (Subcutaneous Tissue) Exposed: No Tendon Exposed: No Muscle Exposed: No Joint Exposed: No Bone Exposed: No Treatment Notes Wound #4 (Toe Third) Wound Laterality: Right Cleanser Soap and Water Discharge Instruction: May shower and wash wound with dial antibacterial soap and water prior to dressing change. Peri-Wound  [name] Topical Primary Dressing KerraCel Ag  Gelling Fiber Dressing, 2x2 in (silver alginate) Discharge Instruction: Apply silver alginate to wound bed as instructed Iodoform packing strip 1/4 (in) Discharge Instruction: Lightly pack as instructed Secondary Dressing Woven Gauze Sponges 2x2 in Discharge Instruction: Apply over primary dressing as directed. Secured With Conforming Stretch Gauze Bandage, Sterile 2x75 (in/in) Discharge Instruction: Secure with stretch gauze as directed. Paper Tape, 1x10 (in/yd) Discharge Instruction: Secure dressing with tape as directed. Compression Wrap Compression Stockings Add-Ons Electronic Signature(s) Signed: 01/22/2021 6:12:45 PM By: Rhae Hammock RN Entered By: Rhae Hammock on 01/22/2021 12:02:03 -------------------------------------------------------------------------------- Vitals Details Patient Name: Date of Service: [name] Hawthorne B. 01/22/2021 10:30 A M Medical Record Number: [id number] Patient Account Number: [id number] Date of Birth/Sex: Treating RN: 05-11-1951 (70 y.o. [name] [name], [name] [name] Keywon Mestre: [name] Other Clinician: Referring Seher Schlagel: Treating Brynleigh Sequeira/Extender: Lanier Clam in Treatment: 0 Vital Signs Time Taken: 11:02 Temperature (F): 97.9 Height (in): 73 Pulse (bpm): 74 Source: Stated Respiratory Rate (breaths/min): 17 Weight (lbs): 222 Blood Pressure (mmHg): 130/74 Source: Stated Reference Range: 80 - 120 mg / dl Body Mass Index (BMI): 29.3 Electronic Signature(s) Signed: 01/22/2021 6:12:45 PM By: Rhae Hammock RN Entered By: Rhae Hammock on 01/22/2021 11:02:31

## 2021-01-25 DIAGNOSIS — M86072 Acute hematogenous osteomyelitis, left ankle and foot: Secondary | ICD-10-CM | POA: Diagnosis not present

## 2021-01-25 DIAGNOSIS — M869 Osteomyelitis, unspecified: Secondary | ICD-10-CM | POA: Diagnosis not present

## 2021-01-25 DIAGNOSIS — E11621 Type 2 diabetes mellitus with foot ulcer: Secondary | ICD-10-CM | POA: Diagnosis not present

## 2021-01-27 ENCOUNTER — Other Ambulatory Visit: Payer: Self-pay

## 2021-01-27 ENCOUNTER — Encounter (HOSPITAL_BASED_OUTPATIENT_CLINIC_OR_DEPARTMENT_OTHER): Payer: Medicare HMO | Admitting: Internal Medicine

## 2021-01-27 DIAGNOSIS — M86371 Chronic multifocal osteomyelitis, right ankle and foot: Secondary | ICD-10-CM | POA: Diagnosis not present

## 2021-01-27 DIAGNOSIS — E11621 Type 2 diabetes mellitus with foot ulcer: Secondary | ICD-10-CM | POA: Diagnosis not present

## 2021-01-27 DIAGNOSIS — L97511 Non-pressure chronic ulcer of other part of right foot limited to breakdown of skin: Secondary | ICD-10-CM | POA: Diagnosis not present

## 2021-01-27 LAB — GLUCOSE, CAPILLARY
Glucose-Capillary: 85 mg/dL (ref 70–99)
Glucose-Capillary: 93 mg/dL (ref 70–99)
Glucose-Capillary: 96 mg/dL (ref 70–99)

## 2021-01-27 NOTE — Progress Notes (Addendum)
[name] [name], [name] [name] ([id number]) Visit Report for 01/27/2021 HBO Details Patient Name: Date of Service: [name], [name] 01/27/2021 10:00 A M Medical Record Number: [id number] Patient Account Number: [id number] Date of Birth/Sex: Treating RN: 05/05/1951 (70 y.o. [name] Primary Care Wanisha Shiroma: [name] Other Clinician: Donavan Burnet Referring Reinhardt Licausi: Treating Clarinda Obi/Extender: Oren Section in Treatment: 0 HBO Treatment Course Details Treatment Course Number: 1 Ordering Philemon Riedesel: Bernerd Pho Treatments Ordered: otal 40 HBO Treatment Start Date: 01/27/2021 HBO Indication: Diabetic Ulcer(s) of the Lower Extremity HBO Treatment Details Treatment Number: Treatment Not Started: Farris Geiman Choice Patient Type: Outpatient Chamber Type: Monoplace Chamber Serial #: [id number] Treatment Protocol: 2.4 ATA with 90 minutes oxygen, with two 5 minute air breaks Treatment Details Compression Rate Down: 1.0 psi / minute De-Compression Rate Up: 1.0 psi / minute A breaks and ir breathing Compress Tx Pressure Decompress Decompress periods Begins Reached Begins Ends (leave unused spaces blank) Chamber Pressure (ATA 1 2.4 2.4 2.4 2.4 2.4 - - 2.4 1 ) Clock Time (24 hr) - - - - - - --- - Treatment Length: (minutes) Treatment Segments: Vital Signs Capillary Blood Glucose Reference Range: 80 - 120 mg / dl HBO Diabetic Blood Glucose Intervention Range: <131 mg/dl or >249 mg/dl Type: Time Vitals Blood Pulse: Respiratory Temperature: Capillary Blood Glucose Pulse Action Taken: Pressure: Rate: Glucose (mg/dl): Meter #: Oximetry (%) Taken: Pre 10:18 105/54 51 18 98.4 Pre 10:29 110/61 53 18 Pre 11:00 85 8 oz Glucerna Given Pre 11:36 96 8 oz Glucerna Given Pre 12:07 93 HBO Tx Rescheduled for 01/28/21 Treatment Response Treatment Completion Status: Treatment Not Started Reason: Jodye Scali Choice [place] Notes Patient's blood sugar was not at a safe  level for HBO Treatment per protocol. I did orient patient on what to expect with entering HBO chamber, pressurization and equalizing ear pressure. Patient's diastolic BP was below 60 per [facility] treatment protocol. BP was taken again. Patient's pulse rate was below 60 beats per minute per [facility] treatment protocol, as well. Electronic Signature(s) Signed: 01/27/2021 7:46:52 PM By: Donavan Burnet EMT Signed: 01/28/2021 8:26:03 AM By: Kalman Shan DO Previous Signature: 01/27/2021 1:21:37 PM Version By: Leveda Anna By: Donavan Burnet on 01/27/2021 19:46:52 -------------------------------------------------------------------------------- HBO Safety Checklist Details Patient Name: Date of Service: [name] Hawthorne B. 01/27/2021 10:00 A M Medical Record Number: [id number] Patient Account Number: [id number] Date of Birth/Sex: Treating RN: 1951-01-11 (70 y.o. [name] Primary Care Kayonna Lawniczak: [name] Other Clinician: Donavan Burnet Referring Zohan Shiflet: Treating October Peery/Extender: Oren Section in Treatment: 0 HBO Safety Checklist Items Safety Checklist Consent Form Signed Patient voided / foley secured and emptied When did you last eato 0730 n/a patient states he does not use injectable or Last dose of injectable or oral agent oral agent Ostomy pouch emptied and vented if applicable NA All implantable devices assessed, documented and approved NA Intravenous access site secured and place Band wrap Valuables secured Linens and cotton and cotton/polyester blend (less than 51% polyester) Personal oil-based products / skin lotions / body lotions removed Wigs or hairpieces removed NA Smoking or tobacco materials removed NA Books / newspapers / magazines / loose paper removed Cologne, aftershave, perfume and deodorant removed Jewelry removed (may wrap wedding band) Make-up removed NA Hair care products removed Battery  operated devices (external) removed Heating patches and chemical warmers removed Titanium eyewear removed Glasses removed Nail polish cured greater than 10 hours NA Casting material cured greater than 10 hours NA Hearing aids removed NA  Loose dentures or partials removed NA Prosthetics have been removed NA Patient demonstrates correct use of air break device (if applicable) Patient concerns have been addressed Patient grounding bracelet on and cord attached to chamber Specifics for Inpatients (complete in addition to above) Medication sheet sent with patient NA Intravenous medications needed or due during therapy sent with patient NA Drainage tubes (e.g. nasogastric tube or chest tube secured and vented) NA Endotracheal or Tracheotomy tube secured NA Cuff deflated of air and inflated with saline NA Airway suctioned NA Electronic Signature(s) Signed: 01/27/2021 12:57:29 PM By: Leveda Anna ByDonavan Burnet on 01/27/2021 12:57:29

## 2021-01-27 NOTE — Progress Notes (Addendum)
KWINTON, MAAHS ([id number]) Visit Report for 01/27/2021 Arrival Information Details Patient Name: Date of Service: [name] [name] 01/27/2021 10:00 A M Medical Record Number: [id number] Patient Account Number: [id number] Date of Birth/Sex: Treating RN: May 26, 1951 (70 y.o. [name] [name] Primary Care Courtni Balash: [name] Other Clinician: Donavan Burnet Referring Raela Bohl: Treating Sherrel Ploch/Extender: Oren Section in Treatment: 0 Visit Information History Since Last Visit All ordered tests and consults were completed: Yes Patient Arrived: Ambulatory Added or deleted any medications: No Arrival Time: 10:30 Any new allergies or adverse reactions: No Accompanied By: self Had a fall or experienced change in No Transfer Assistance: None activities of daily living that may affect Patient Identification Verified: Yes risk of falls: Secondary Verification Process Completed: Yes Signs or symptoms of abuse/neglect since last visito No Patient Requires Transmission-Based Precautions: No Hospitalized since last visit: No Patient Has Alerts: Yes Pain Present Now: No Patient Alerts: R ABI non compressible L ABI non compressible Electronic Signature(s) Signed: 01/27/2021 12:26:18 PM By: Leveda Anna ByDonavan Burnet on 01/27/2021 12:26:18 -------------------------------------------------------------------------------- Encounter Discharge Information Details Patient Name: Date of Service: [name] Hawthorne B. 01/27/2021 10:00 A M Medical Record Number: [id number] Patient Account Number: [id number] Date of Birth/Sex: Treating RN: 1950/08/07 (70 y.o. [name] [name] Primary Care Hennie Gosa: [name] Other Clinician: Donavan Burnet Referring Donnel Venuto: Treating Armelia Penton/Extender: Oren Section in Treatment: 0 Encounter Discharge Information Items Discharge Condition: Stable Ambulatory Status:  Ambulatory Discharge Destination: Other (Note Required) Telephoned: No Transportation: Private Auto Accompanied By: self Schedule Follow-up Appointment: No Clinical Summary of Care: Notes Patient states he is returning to work. Electronic Signature(s) Signed: 01/28/2021 3:47:43 PM By: Donavan Burnet EMT Entered By: Donavan Burnet on 01/28/2021 15:47:42 -------------------------------------------------------------------------------- [place] Details Patient Name: Date of Service: [name] Hawthorne B. 01/27/2021 10:00 A M Medical Record Number: [id number] Patient Account Number: [id number] Date of Birth/Sex: Treating RN: June 09, 1951 (70 y.o. [name] [name] Primary Care Agron Swiney: [name] Other Clinician: Donavan Burnet Referring Raylie Maddison: Treating Maricela Kawahara/Extender: Oren Section in Treatment: 0 Vital Signs Time Taken: 10:18 Temperature (F): 98.4 Height (in): 73 Pulse (bpm): 51 Weight (lbs): 222 Respiratory Rate (breaths/min): 18 Body Mass Index (BMI): 29.3 Blood Pressure (mmHg): 105/54 Capillary Blood Glucose (mg/dl): 85 Reference Range: 80 - 120 mg / dl Notes 1029- BP 110/61, Pulse 53, Respiratory rate: 18. Blood Glucose Levels: 1100 - 85 mg/dl, 8 oz Glucerna shake given 1136 - 96 mg/dl, 8 oz Glucerna shake given 1207 - 93 mg/dl, HBO Treatment postponed until 01/28/2021. Patient arrived for HBO Treatment. Patient's blood sugar was not at a safe level for HBO Treatment. I did orient patient on expectations with entering chamber, pressurization, and ear equalization. Patient's diastolic BP was below 60 mmHg. BP was taken again. Pulse rate was below 60bpm per [facility] treatment protocol in both instances. Patient denies any symptoms of hypotension and bradycardia stating he felt fine. Electronic Signature(s) Signed: 01/28/2021 3:46:09 PM By: Donavan Burnet EMT Previous Signature: 01/27/2021 12:53:45 PM Version By: Leveda Anna ByDonavan Burnet on 01/28/2021 15:46:09

## 2021-01-28 ENCOUNTER — Encounter (HOSPITAL_BASED_OUTPATIENT_CLINIC_OR_DEPARTMENT_OTHER): Payer: Medicare HMO | Admitting: Internal Medicine

## 2021-01-28 DIAGNOSIS — L97518 Non-pressure chronic ulcer of other part of right foot with other specified severity: Secondary | ICD-10-CM | POA: Diagnosis not present

## 2021-01-28 DIAGNOSIS — L97511 Non-pressure chronic ulcer of other part of right foot limited to breakdown of skin: Secondary | ICD-10-CM | POA: Diagnosis not present

## 2021-01-28 DIAGNOSIS — M86371 Chronic multifocal osteomyelitis, right ankle and foot: Secondary | ICD-10-CM | POA: Diagnosis not present

## 2021-01-28 DIAGNOSIS — E11621 Type 2 diabetes mellitus with foot ulcer: Secondary | ICD-10-CM | POA: Diagnosis not present

## 2021-01-28 LAB — GLUCOSE, CAPILLARY
Glucose-Capillary: 141 mg/dL — ABNORMAL HIGH (ref 70–99)
Glucose-Capillary: 82 mg/dL (ref 70–99)

## 2021-01-28 NOTE — Progress Notes (Signed)
EURA, RADABAUGH ([id number]) Visit Report for 01/28/2021 Arrival Information Details Patient Name: Date of Service: [name] [name], [name] [name] 01/28/2021 10:00 A M Medical Record Number: [id number] Patient Account Number: [id number] Date of Birth/Sex: Treating RN: 30-Jan-1951 (70 y.o. [name], [name] Primary Care Maddyx Wieck: [name] Other Clinician: Donavan Burnet Referring Isidor Bromell: Treating Elliona Doddridge/Extender: Lanier Clam in Treatment: 0 Visit Information History Since Last Visit All ordered tests and consults were completed: Yes Patient Arrived: Ambulatory Added or deleted any medications: No Arrival Time: 09:50 Any new allergies or adverse reactions: No Accompanied By: Self Had a fall or experienced change in No Transfer Assistance: None activities of daily living that may affect Patient Identification Verified: Yes risk of falls: Secondary Verification Process Completed: Yes Signs or symptoms of abuse/neglect since last visito No Patient Requires Transmission-Based Precautions: No Hospitalized since last visit: No Patient Has Alerts: Yes Implantable device outside of the clinic excluding No Patient Alerts: R ABI non compressible cellular tissue based products placed in the center L ABI non compressible since last visit: Pain Present Now: No Electronic Signature(s) Signed: 01/28/2021 2:34:08 PM By: Donavan Burnet EMT Entered By: Donavan Burnet on 01/28/2021 12:09:22 -------------------------------------------------------------------------------- Encounter Discharge Information Details Patient Name: Date of Service: [name] Hawthorne B. 01/28/2021 10:00 A M Medical Record Number: [id number] Patient Account Number: [id number] Date of Birth/Sex: Treating RN: 1951/02/25 (70 y.o. [name], [name] Primary Care Gerldine Suleiman: [name] Other Clinician: Donavan Burnet Referring Raphael Espe: Treating Thaddus Mcdowell/Extender: Lanier Clam in Treatment: 0 Encounter Discharge Information Items Discharge Condition: Stable Ambulatory Status: Ambulatory Discharge Destination: Other (Note Required) Transportation: Private Auto Accompanied By: self Schedule Follow-up Appointment: No Clinical Summary of Care: Electronic Signature(s) Signed: 01/28/2021 2:32:04 PM By: Donavan Burnet EMT Entered By: Donavan Burnet on 01/28/2021 14:32:04 -------------------------------------------------------------------------------- Vitals Details Patient Name: Date of Service: [name] Hawthorne B. 01/28/2021 10:00 A M Medical Record Number: [id number] Patient Account Number: [id number] Date of Birth/Sex: Treating RN: [birth year] (71 y.o. [name], [name] Primary Care Chaeli Judy: [name] Other Clinician: Donavan Burnet Referring Assad Harbeson: Treating Harveer Sadler/Extender: Lanier Clam in Treatment: 0 Vital Signs Time Taken: 10:04 Temperature (F): 98.2 Height (in): 73 Pulse (bpm): 52 Weight (lbs): 222 Respiratory Rate (breaths/min): 18 Body Mass Index (BMI): 29.3 Blood Pressure (mmHg): 128/42 Capillary Blood Glucose (mg/dl): 141 Reference Range: 80 - 120 mg / dl Electronic Signature(s) Signed: 01/28/2021 2:34:08 PM By: Donavan Burnet EMT Entered By: Donavan Burnet on 01/28/2021 12:10:37

## 2021-01-28 NOTE — Progress Notes (Signed)
[name] [name], [name] [name] ([id number]) Visit Report for 01/27/2021 [facility] Details Patient Name: Date of Service: TYCEN, DOCKTER 01/27/2021 Medical Record Number: [id number] Patient Account Number: [id number] Date of Birth/Sex: Treating RN: 1950/11/18 (70 y.o. [name] Primary Care Provider: [name] Other Clinician: Donavan Burnet Referring Provider: Treating Provider/Extender: Oren Section in Treatment: 0 Diagnosis Coding ICD-10 Codes Code Description [phone] Type 2 diabetes mellitus with foot ulcer L97.518 Non-pressure chronic ulcer of other part of right foot with other specified severity L97.511 Non-pressure chronic ulcer of other part of right foot limited to breakdown of skin M86.371 Chronic multifocal osteomyelitis, right ankle and foot E11.621 Type 2 diabetes mellitus with foot ulcer Facility Procedures CPT4 Code Description Modifier Quantity [id number] - WOUND CARE VISIT-LEV 2 EST PT 1 Physician Procedures Quantity CPT4 Code Description Modifier [id number] - WC PHYS LEVEL 2 - EST PT 1 ICD-10 Diagnosis Description E11.621 Type 2 diabetes mellitus with foot ulcer L97.518 Non-pressure chronic ulcer of other part of right foot with other specified severity L97.511 Non-pressure chronic ulcer of other part of right foot limited to breakdown of skin M86.371 Chronic multifocal osteomyelitis, right ankle and foot Electronic Signature(s) Signed: 01/28/2021 3:49:58 PM By: Donavan Burnet EMT Signed: 01/28/2021 5:32:10 PM By: Kalman Shan DO Entered By: Donavan Burnet on 01/28/2021 15:49:57

## 2021-01-28 NOTE — Progress Notes (Signed)
[name] [name], [name] [name] ([id number]) Visit Report for 01/28/2021 SuperBill Details Patient Name: Date of Service: [name] [name], [name] [name] 01/28/2021 Medical Record Number: [id number] Patient Account Number: [id number] Date of Birth/Sex: Treating RN: 07-16-1950 (70 y.o. [name], [name] Primary Care Provider: [name] Other Clinician: Donavan Burnet Referring Provider: Treating Provider/Extender: Lanier Clam in Treatment: 0 Diagnosis Coding ICD-10 Codes Code Description [phone] Type 2 diabetes mellitus with foot ulcer L97.518 Non-pressure chronic ulcer of other part of right foot with other specified severity L97.511 Non-pressure chronic ulcer of other part of right foot limited to breakdown of skin M86.371 Chronic multifocal osteomyelitis, right ankle and foot E11.621 Type 2 diabetes mellitus with foot ulcer Facility Procedures CPT4 Code Description Modifier Quantity [id number]-(Facility Use Only) HBOT full body chamber, 71min , 5 ICD-10 Diagnosis Description E11.621 Type 2 diabetes mellitus with foot ulcer L97.518 Non-pressure chronic ulcer of other part of right foot with other specified severity L97.511 Non-pressure chronic ulcer of other part of right foot limited to breakdown of skin M86.371 Chronic multifocal osteomyelitis, right ankle and foot Physician Procedures Quantity CPT4 Code Description Modifier [id number] - WC PHYS HYPERBARIC OXYGEN THERAPY 1 ICD-10 Diagnosis Description E11.621 Type 2 diabetes mellitus with foot ulcer L97.518 Non-pressure chronic ulcer of other part of right foot with other specified severity L97.511 Non-pressure chronic ulcer of other part of right foot limited to breakdown of skin M86.371 Chronic multifocal osteomyelitis, right ankle and foot Electronic Signature(s) Signed: 01/28/2021 2:30:42 PM By: Donavan Burnet EMT Signed: 01/28/2021 5:24:30 PM By: Linton Ham MD Entered By: Donavan Burnet on  01/28/2021 14:30:41

## 2021-01-28 NOTE — Progress Notes (Signed)
SLAYTER, MOORHOUSE ([id number]) Visit Report for 01/28/2021 HBO Details Patient Name: Date of Service: [name] [name], [name] [name] 01/28/2021 10:00 A M Medical Record Number: [id number] Patient Account Number: [id number] Date of Birth/Sex: Treating RN: 1950/07/31 (70 y.o. [name] [name], [name] [name] Primary Care Primo Innis: [name] Other Clinician: Donavan [name] Referring Naome Brigandi: Treating Quianna Avery/Extender: Lanier Clam in Treatment: 0 HBO Treatment Course Details Treatment Course Number: 1 Ordering Kena Limon: Bernerd Pho Treatments Ordered: otal 40 HBO Treatment Start Date: 01/27/2021 HBO Indication: Diabetic Ulcer(s) of the Lower Extremity HBO Treatment Details Treatment Number: 1 Patient Type: Outpatient Chamber Type: Monoplace Chamber Serial #: [id number] Treatment Protocol: 2.5 ATA with 90 minutes oxygen, with two 5 minute air breaks Treatment Details Compression Rate Down: 1.0 psi / minute De-Compression Rate Up: 1.0 psi / minute A breaks and breathing ir Compress Tx Pressure periods Decompress Decompress Begins Reached (leave unused spaces Begins Ends blank) Chamber Pressure (ATA 1 2.5 2.5 2.5 2.5 2.5 - - 2.5 1 ) Clock Time (24 hr) 10:26 10:53 11:23 11:28 11:58 12:03 - - 12:33 12:52 Treatment Length: 146 (minutes) Treatment Segments: 5 Vital Signs Capillary Blood Glucose Reference Range: 80 - 120 mg / dl HBO Diabetic Blood Glucose Intervention Range: <131 mg/dl or >249 mg/dl Type: Time Vitals Blood Pulse: Respiratory Temperature: Capillary Blood Glucose Pulse Action Taken: Pressure: Rate: Glucose (mg/dl): Meter #: Oximetry (%) Taken: Pre 10:04 128/42 52 18 98.2 141 Post 12:56 118/52 47 18 97.9 82 Patient given 8 oz Glucerna Shake Treatment Response Treatment Toleration: Well Treatment Completion Status: Treatment Completed without Adverse Event Alfreddie Consalvo Notes Patient was placed in chamber and traveled at 1 psi per minute, stopping travel a  few times to ensure that patient was equalizing ear pressure during compression. Patient had no issues during decompression of the chamber after treatment. Patient was given an 8 oz Glucerna Shake per [facility] protocol. Patient waited 15 minutes, at which time patient denied symptoms of hypoglycemia. This was the patient's first treatment. Cardiorespiratory exams were normal. Badik exam showed some cerumen but what I can see of his tympanic membrane was normal. Otherwise he tolerated things well Physician HBO Attestation: I certify that I supervised this HBO treatment in accordance with Medicare guidelines. A trained emergency response team is readily available per Yes hospital policies and procedures. Continue HBOT as ordered. Yes Electronic Signature(s) Signed: 01/28/2021 5:24:30 PM By: Linton Ham MD Previous Signature: 01/28/2021 2:30:18 PM Version By: [name] [name] EMT Entered By: Linton Ham on 01/28/2021 17:13:13 -------------------------------------------------------------------------------- HBO Safety Checklist Details Patient Name: Date of Service: [name] Hawthorne B. 01/28/2021 10:00 A M Medical Record Number: [id number] Patient Account Number: [id number] Date of Birth/Sex: Treating RN: [birth year] (70 y.o. [name] [name], [name] [name] Primary Care Saket Hellstrom: [name] Other Clinician: Donavan [name] Referring Nailah Luepke: Treating Lain Tetterton/Extender: Lanier Clam in Treatment: 0 HBO Safety Checklist Items Safety Checklist Consent Form Signed Patient voided / foley secured and emptied When did you last eato 0930 Last dose of injectable or oral agent N/A Ostomy pouch emptied and vented if applicable NA All implantable devices assessed, documented and approved NA Intravenous access site secured and place Band net Valuables secured Linens and cotton and cotton/polyester blend (less than 51% polyester) Personal oil-based products / skin  lotions / body lotions removed Wigs or hairpieces removed NA Smoking or tobacco materials removed NA Books / newspapers / magazines / loose paper removed Cologne, aftershave, perfume and deodorant removed Jewelry removed (may wrap wedding band) Make-up removed NA Hair  care products removed Battery operated devices (external) removed Heating patches and chemical warmers removed Titanium eyewear removed Nail polish cured greater than 10 hours NA Casting material cured greater than 10 hours NA Hearing aids removed NA Loose dentures or partials removed NA Prosthetics have been removed NA Patient demonstrates correct use of air break device (if applicable) Patient concerns have been addressed Patient grounding bracelet on and cord attached to chamber Specifics for Inpatients (complete in addition to above) Medication sheet sent with patient NA Intravenous medications needed or due during therapy sent with patient NA Drainage tubes (e.g. nasogastric tube or chest tube secured and vented) NA Endotracheal or Tracheotomy tube secured NA Cuff deflated of air and inflated with saline NA Airway suctioned NA Electronic Signature(s) Signed: 01/28/2021 2:33:50 PM By: [name] [name] EMT Entered By: [name] [name] on 01/28/2021 14:33:49

## 2021-01-29 ENCOUNTER — Other Ambulatory Visit: Payer: Self-pay

## 2021-01-29 ENCOUNTER — Encounter (HOSPITAL_BASED_OUTPATIENT_CLINIC_OR_DEPARTMENT_OTHER): Payer: Medicare HMO | Admitting: Physician Assistant

## 2021-01-29 ENCOUNTER — Encounter (HOSPITAL_BASED_OUTPATIENT_CLINIC_OR_DEPARTMENT_OTHER): Payer: Medicare HMO | Admitting: Internal Medicine

## 2021-01-29 DIAGNOSIS — M869 Osteomyelitis, unspecified: Secondary | ICD-10-CM | POA: Diagnosis not present

## 2021-01-29 DIAGNOSIS — L97512 Non-pressure chronic ulcer of other part of right foot with fat layer exposed: Secondary | ICD-10-CM | POA: Diagnosis not present

## 2021-01-29 DIAGNOSIS — E11621 Type 2 diabetes mellitus with foot ulcer: Secondary | ICD-10-CM | POA: Diagnosis not present

## 2021-01-29 DIAGNOSIS — M86371 Chronic multifocal osteomyelitis, right ankle and foot: Secondary | ICD-10-CM | POA: Diagnosis not present

## 2021-01-29 DIAGNOSIS — L97511 Non-pressure chronic ulcer of other part of right foot limited to breakdown of skin: Secondary | ICD-10-CM | POA: Diagnosis not present

## 2021-01-29 LAB — GLUCOSE, CAPILLARY
Glucose-Capillary: 126 mg/dL — ABNORMAL HIGH (ref 70–99)
Glucose-Capillary: 131 mg/dL — ABNORMAL HIGH (ref 70–99)
Glucose-Capillary: 112 mg/dL — ABNORMAL HIGH (ref 70–99)

## 2021-01-29 NOTE — Progress Notes (Addendum)
BURNHAM, TROST ([id number]) Visit Report for 01/29/2021 HBO Details Patient Name: Date of Service: AMAZIAH, RAISANEN 01/29/2021 10:00 A M Medical Record Number: [id number] Patient Account Number: [id number] Date of Birth/Sex: Treating RN: 25-Jun-1951 (70 y.o. [name] Primary Care Donnavin Vandenbrink: [name] Other Clinician: Donavan Burnet Referring Viki Carrera: Treating Claribel Sachs/Extender: Tally Joe in Treatment: 1 HBO Treatment Course Details Treatment Course Number: 1 Ordering Dajah Fischman: Bernerd Pho Treatments Ordered: otal 40 HBO Treatment Start Date: 01/27/2021 HBO Indication: Diabetic Ulcer(s) of the Lower Extremity HBO Treatment Details Treatment Number: 2 Patient Type: Outpatient Chamber Type: Monoplace Chamber Serial #: [id number] Treatment Protocol: 2.5 ATA with 90 minutes oxygen, with two 5 minute air breaks Treatment Details Compression Rate Down: 1.0 psi / minute De-Compression Rate Up: 1.0 psi / minute A breaks and breathing ir Compress Tx Pressure periods Decompress Decompress Begins Reached (leave unused spaces Begins Ends blank) Chamber Pressure (ATA 1 2.5 2.5 2.5 2.5 2.5 - - 2.5 1 ) Clock Time (24 hr) 10:42 11:04 11:34 11:39 12:09 12:14 - - 12:44 13:06 Treatment Length: 144 (minutes) Treatment Segments: 5 Vital Signs Capillary Blood Glucose Reference Range: 80 - 120 mg / dl HBO Diabetic Blood Glucose Intervention Range: <131 mg/dl or >249 mg/dl Type: Time Vitals Blood Pulse: Respiratory Temperature: Capillary Blood Glucose Pulse Action Taken: Pressure: Rate: Glucose (mg/dl): Meter #: Oximetry (%) Taken: Pre 09:36 130/51 47 18 98 126 Patient given 8 oz Glucerna Shake Post 13:11 129/62 48 18 Pre 10:18 131 Proceed with HBO Tx Treatment Response Treatment Toleration: Well Treatment Completion Status: Treatment Completed without Adverse Event Aerilyn Slee Notes Patient's blood glucose was 126 mg/dL when first measured.  Patient was given an 8 oz. Glucerna shake and after 30 minutes patient's blood glucose level was at 131. Treatment initiated per [facility] protocol. Patient complained that his ears felt stuffy. Eulamae Greenstein examined ears and patient was cleared for treatment with the use of decongestant. During compression of chamber patient experienced pain in right ear. Decreased pressure to enable patient to equalize pressure in middle ear. Patient was able to equalize ears on his own afterwards. Treatment was tolerated well, thereafter, without any concerns. Electronic Signature(s) Signed: 01/29/2021 2:03:30 PM By: Donavan Burnet EMT Signed: 01/29/2021 5:20:22 PM By: Worthy Keeler PA-C Entered By: Donavan Burnet on 01/29/2021 14:03:30 -------------------------------------------------------------------------------- HBO Safety Checklist Details Patient Name: Date of Service: [name] 01/29/2021 10:00 A M Medical Record Number: [id number] Patient Account Number: [id number] Date of Birth/Sex: Treating RN: [birth year] (70 y.o. [name] Primary Care Charae Depaolis: [name] Other Clinician: Donavan Burnet Referring Devian Bartolomei: Treating Kavita Bartl/Extender: Tally Joe in Treatment: 1 HBO Safety Checklist Items Safety Checklist Consent Form Signed Patient voided / foley secured and emptied When did you last eato 0800 Last dose of injectable or oral agent Pt states Metformin every AM Ostomy pouch emptied and vented if applicable NA All implantable devices assessed, documented and approved NA Intravenous access site secured and place Band net Valuables secured Linens and cotton and cotton/polyester blend (less than 51% polyester) Personal oil-based products / skin lotions / body lotions removed Wigs or hairpieces removed NA Smoking or tobacco materials removed NA Books / newspapers / magazines / loose paper removed Cologne, aftershave, perfume and  deodorant removed Jewelry removed (may wrap wedding band) Make-up removed NA Hair care products removed Battery operated devices (external) removed Heating patches and chemical warmers removed Titanium eyewear removed Nail polish cured greater than 10 hours  NA Casting material cured greater than 10 hours NA Hearing aids removed NA Loose dentures or partials removed NA implants Prosthetics have been removed NA Patient demonstrates correct use of air break device (if applicable) Patient concerns have been addressed Patient grounding bracelet on and cord attached to chamber Specifics for Inpatients (complete in addition to above) Medication sheet sent with patient NA Intravenous medications needed or due during therapy sent with patient NA Drainage tubes (e.g. nasogastric tube or chest tube secured and vented) NA Endotracheal or Tracheotomy tube secured NA Cuff deflated of air and inflated with saline NA Airway suctioned NA Electronic Signature(s) Signed: 01/29/2021 1:42:10 PM By: Donavan Burnet EMT Entered By: Donavan Burnet on 01/29/2021 13:42:10

## 2021-01-30 ENCOUNTER — Encounter (HOSPITAL_BASED_OUTPATIENT_CLINIC_OR_DEPARTMENT_OTHER): Payer: Medicare HMO | Admitting: Internal Medicine

## 2021-01-30 DIAGNOSIS — L97511 Non-pressure chronic ulcer of other part of right foot limited to breakdown of skin: Secondary | ICD-10-CM | POA: Diagnosis not present

## 2021-01-30 DIAGNOSIS — M86371 Chronic multifocal osteomyelitis, right ankle and foot: Secondary | ICD-10-CM | POA: Diagnosis not present

## 2021-01-30 DIAGNOSIS — M869 Osteomyelitis, unspecified: Secondary | ICD-10-CM | POA: Diagnosis not present

## 2021-01-30 DIAGNOSIS — E11621 Type 2 diabetes mellitus with foot ulcer: Secondary | ICD-10-CM | POA: Diagnosis not present

## 2021-01-30 LAB — GLUCOSE, CAPILLARY: Glucose-Capillary: 178 mg/dL — ABNORMAL HIGH (ref 70–99)

## 2021-01-30 NOTE — Progress Notes (Addendum)
SURAFEL, HILLEARY ([id number]) Visit Report for 01/30/2021 Arrival Information Details Patient Name: Date of Service: [name] [name] 01/30/2021 8:00 A M Medical Record Number: [id number] Patient Account Number: [id number] Date of Birth/Sex: Treating RN: 07-13-1951 (70 y.o. [name] [name], [name] [name] Primary Care [name] [name]: [name] [name] Other Clinician: Donavan [name] Referring [name] [name]: Treating [name] [name]/Extender: [name] [name] in Treatment: 1 Visit Information History Since Last Visit All ordered tests and consults were completed: Yes Patient Arrived: Ambulatory Added or deleted any medications: No Arrival Time: 07:30 Any new allergies or adverse reactions: No Accompanied By: self Had a fall or experienced change in No Transfer Assistance: None activities of daily living that may affect Patient Identification Verified: Yes risk of falls: Secondary Verification Process Completed: Yes Signs or symptoms of abuse/neglect since last visito No Patient Requires Transmission-Based Precautions: No Hospitalized since last visit: No Patient Has Alerts: Yes Implantable device outside of the clinic excluding No Patient Alerts: R ABI non compressible cellular tissue based products placed in the center L ABI non compressible since last visit: Pain Present Now: No Electronic Signature(s) Signed: 01/30/2021 5:06:24 PM By: [name] [name] EMT Entered By: [name] [name] on 01/30/2021 11:03:23 -------------------------------------------------------------------------------- Encounter Discharge Information Details Patient Name: Date of Service: [name] Hawthorne B. 01/30/2021 8:00 A M Medical Record Number: [id number] Patient Account Number: [id number] Date of Birth/Sex: Treating RN: 10-Dec-1950 (70 y.o. [name] [name] Primary Care [name] [name]: [name] [name] Other Clinician: Donavan [name] Referring [name] [name]: Treating [name] [name]/Extender: [name] [name] in Treatment: 1 Encounter Discharge Information Items Discharge Condition: Stable Ambulatory Status: Ambulatory Discharge Destination: Other (Note Required) Transportation: Private Auto Accompanied By: self Schedule Follow-up Appointment: No Clinical Summary of Care: Notes Patient states he goes to work after treatments. Electronic Signature(s) Signed: 01/30/2021 5:06:24 PM By: [name] [name] EMT Entered By: [name] [name] on 01/30/2021 11:15:15 -------------------------------------------------------------------------------- Vitals Details Patient Name: Date of Service: [name] Hawthorne B. 01/30/2021 8:00 A M Medical Record Number: [id number] Patient Account Number: [id number] Date of Birth/Sex: Treating RN: 15-Feb-1951 (70 y.o. [name] [name], [name] [name] Primary Care [name] [name]: [name] [name] Other Clinician: Donavan [name] Referring [name] [name]: Treating [name] [name]/Extender: [name] [name] in Treatment: 1 Vital Signs Time Taken: 07:59 Temperature (F): 98.1 Height (in): 73 Pulse (bpm): 62 Weight (lbs): 222 Respiratory Rate (breaths/min): 18 Body Mass Index (BMI): 29.3 Blood Pressure (mmHg): 136/51 Capillary Blood Glucose (mg/dl): 178 Reference Range: 80 - 120 mg / dl Notes Patient was prepared to undergo hyperbaric treatment. After 01/29/21 treatment [name] recommended that patient's ears be examined prior to treatment for a few days. Physician examined patient's ears and referred him to ENT for tympanostomy tubes. HBO Treatment was postponed as a result. Electronic Signature(s) Signed: 01/31/2021 10:28:00 AM By: [name] [name] EMT Previous Signature: 01/30/2021 5:06:24 PM Version By: [name] [name] EMT Entered By: [name] [name] on 01/31/2021 10:26:41

## 2021-01-30 NOTE — Progress Notes (Signed)
[name] [name], [name] [name] ([id number]) Visit Report for 01/29/2021 Arrival Information Details Patient Name: Date of Service: TRAY, KLAYMAN 01/29/2021 10:00 A M Medical Record Number: [id number] Patient Account Number: [id number] Date of Birth/Sex: Treating RN: 12-12-1950 (70 y.o. [name] Primary Care Bette Brienza: [name] Other Clinician: Donavan Burnet Referring Sayana Salley: Treating Floreen Teegarden/Extender: Tally Joe in Treatment: 1 Visit Information History Since Last Visit All ordered tests and consults were completed: Yes Patient Arrived: Ambulatory Added or deleted any medications: No Arrival Time: 09:25 Any new allergies or adverse reactions: No Accompanied By: self Had a fall or experienced change in No Transfer Assistance: None activities of daily living that may affect Patient Identification Verified: Yes risk of falls: Secondary Verification Process Completed: Yes Signs or symptoms of abuse/neglect since last visito No Patient Requires Transmission-Based Precautions: No Hospitalized since last visit: No Patient Has Alerts: Yes Implantable device outside of the clinic excluding No Patient Alerts: R ABI non compressible cellular tissue based products placed in the center L ABI non compressible since last visit: Pain Present Now: No Electronic Signature(s) Signed: 01/29/2021 1:38:33 PM By: Donavan Burnet EMT Entered By: Donavan Burnet on 01/29/2021 13:38:32 -------------------------------------------------------------------------------- Encounter Discharge Information Details Patient Name: Date of Service: [name] 01/29/2021 10:00 A M Medical Record Number: [id number] Patient Account Number: [id number] Date of Birth/Sex: Treating RN: 07/06/1951 (70 y.o. [name] Primary Care Bevely Hackbart: [name] Other Clinician: Donavan Burnet Referring Alec Mcphee: Treating Mendy Chou/Extender: Tally Joe in Treatment: 1 Encounter Discharge Information Items Discharge Condition: Stable Ambulatory Status: Ambulatory Discharge Destination: Other (Note Required) Transportation: Private Auto Accompanied By: self Schedule Follow-up Appointment: No Clinical Summary of Care: Notes Patient states he goes to work after treatments. Electronic Signature(s) Signed: 01/29/2021 2:04:44 PM By: Donavan Burnet EMT Entered By: Donavan Burnet on 01/29/2021 14:04:43 -------------------------------------------------------------------------------- Vitals Details Patient Name: Date of Service: [name] 01/29/2021 10:00 A M Medical Record Number: [id number] Patient Account Number: [id number] Date of Birth/Sex: Treating RN: [birth year] (70 y.o. [name] Primary Care Tayloranne Lekas: [name] Other Clinician: Donavan Burnet Referring Sheyenne Konz: Treating Debrina Kizer/Extender: Tally Joe in Treatment: 1 Vital Signs Time Taken: 09:36 Temperature (F): 98 Height (in): 73 Pulse (bpm): 47 Weight (lbs): 222 Respiratory Rate (breaths/min): 18 Body Mass Index (BMI): 29.3 Blood Pressure (mmHg): 130/51 Capillary Blood Glucose (mg/dl): 126 Reference Range: 80 - 120 mg / dl Electronic Signature(s) Signed: 01/29/2021 1:39:31 PM By: Donavan Burnet EMT Entered By: Donavan Burnet on 01/29/2021 13:39:31

## 2021-01-30 NOTE — Progress Notes (Signed)
MUATH, SPROUL ([id number]) Visit Report for 01/29/2021 SuperBill Details Patient Name: Date of Service: [name] [name], [name] [name] 01/29/2021 Medical Record Number: [id number] Patient Account Number: [id number] Date of Birth/Sex: Treating RN: 05-19-1951 (70 y.o. [name] Primary Care Provider: [name] Other Clinician: Donavan Burnet Referring Provider: Treating Provider/Extender: Tally Joe in Treatment: 1 Diagnosis Coding ICD-10 Codes Code Description [phone] Type 2 diabetes mellitus with foot ulcer L97.518 Non-pressure chronic ulcer of other part of right foot with other specified severity L97.511 Non-pressure chronic ulcer of other part of right foot limited to breakdown of skin M86.371 Chronic multifocal osteomyelitis, right ankle and foot E11.621 Type 2 diabetes mellitus with foot ulcer Facility Procedures CPT4 Code Description Modifier Quantity [id number]-(Facility Use Only) HBOT full body chamber, 37mn , 5 ICD-10 Diagnosis Description E11.621 Type 2 diabetes mellitus with foot ulcer L97.518 Non-pressure chronic ulcer of other part of right foot with other specified severity L97.511 Non-pressure chronic ulcer of other part of right foot limited to breakdown of skin M86.371 Chronic multifocal osteomyelitis, right ankle and foot Physician Procedures Quantity CPT4 Code Description Modifier [id number] PHYS HYPERBARIC OXYGEN THERAPY 1 ICD-10 Diagnosis Description E11.621 Type 2 diabetes mellitus with foot ulcer L97.518 Non-pressure chronic ulcer of other part of right foot with other specified severity L97.511 Non-pressure chronic ulcer of other part of right foot limited to breakdown of skin M86.371 Chronic multifocal osteomyelitis, right ankle and foot Electronic Signature(s) Signed: 01/29/2021 2:03:52 PM By: SDonavan BurnetEMT Signed: 01/29/2021 5:20:22 PM By: SWorthy KeelerPA-C Entered By: SDonavan Burneton  01/29/2021 14:03:51

## 2021-01-31 ENCOUNTER — Encounter (HOSPITAL_BASED_OUTPATIENT_CLINIC_OR_DEPARTMENT_OTHER): Payer: Medicare HMO | Admitting: Internal Medicine

## 2021-01-31 DIAGNOSIS — H6983 Other specified disorders of Eustachian tube, bilateral: Secondary | ICD-10-CM | POA: Diagnosis not present

## 2021-01-31 DIAGNOSIS — H903 Sensorineural hearing loss, bilateral: Secondary | ICD-10-CM | POA: Diagnosis not present

## 2021-02-03 ENCOUNTER — Other Ambulatory Visit: Payer: Self-pay

## 2021-02-03 ENCOUNTER — Encounter (HOSPITAL_BASED_OUTPATIENT_CLINIC_OR_DEPARTMENT_OTHER): Payer: Medicare HMO | Admitting: Internal Medicine

## 2021-02-03 DIAGNOSIS — L97511 Non-pressure chronic ulcer of other part of right foot limited to breakdown of skin: Secondary | ICD-10-CM | POA: Diagnosis not present

## 2021-02-03 DIAGNOSIS — M86371 Chronic multifocal osteomyelitis, right ankle and foot: Secondary | ICD-10-CM | POA: Diagnosis not present

## 2021-02-03 DIAGNOSIS — E11621 Type 2 diabetes mellitus with foot ulcer: Secondary | ICD-10-CM | POA: Diagnosis not present

## 2021-02-03 LAB — GLUCOSE, CAPILLARY
Glucose-Capillary: 111 mg/dL — ABNORMAL HIGH (ref 70–99)
Glucose-Capillary: 165 mg/dL — ABNORMAL HIGH (ref 70–99)
Glucose-Capillary: 118 mg/dL — ABNORMAL HIGH (ref 70–99)

## 2021-02-03 NOTE — Progress Notes (Signed)
[name] [name], [name] [name] ([id number]) Visit Report for 01/30/2021 SuperBill Details Patient Name: Date of Service: [name] [name], [name] [name] 01/30/2021 Medical Record Number: [id number] Patient Account Number: [id number] Date of Birth/Sex: Treating RN: [birth year] (70 y.o. [name] [name], [name] Primary Care Provider: [name] Other Clinician: Donavan Burnet Referring Provider: Treating Provider/Extender: Lanier Clam in Treatment: 1 Diagnosis Coding ICD-10 Codes Code Description [phone] Type 2 diabetes mellitus with foot ulcer L97.518 Non-pressure chronic ulcer of other part of right foot with other specified severity L97.511 Non-pressure chronic ulcer of other part of right foot limited to breakdown of skin M86.371 Chronic multifocal osteomyelitis, right ankle and foot E11.621 Type 2 diabetes mellitus with foot ulcer Facility Procedures CPT4 Code Description Modifier Quantity [id number] - WOUND CARE VISIT-LEV 2 EST PT 1 Physician Procedures Quantity CPT4 Code Description Modifier [id number] - WC PHYS LEVEL 2 - EST PT 1 ICD-10 Diagnosis Description E11.621 Type 2 diabetes mellitus with foot ulcer L97.518 Non-pressure chronic ulcer of other part of right foot with other specified severity L97.511 Non-pressure chronic ulcer of other part of right foot limited to breakdown of skin M86.371 Chronic multifocal osteomyelitis, right ankle and foot Electronic Signature(s) Signed: 01/31/2021 10:28:00 AM By: Donavan Burnet EMT Signed: 02/03/2021 12:43:15 PM By: Linton Ham MD Entered By: Donavan Burnet on 01/31/2021 10:23:37

## 2021-02-03 NOTE — Progress Notes (Signed)
[name], [name] ([id number]) Visit Report for 01/29/2021 Debridement Details Patient Name: Date of Service: [name] [name], [name] [name] 01/29/2021 8:30 A M Medical Record Number: [id number] Patient Account Number: [id number] Date of Birth/Sex: Treating RN: 1950/09/18 (70 y.o. [name] [name] Primary Care Provider: [name] Other Clinician: Referring Provider: Treating Provider/Extender: Lanier Clam in Treatment: 1 Debridement Performed for Assessment: Wound #2 Right,Medial T Great oe Performed By: Physician Ricard Dillon., MD Debridement Type: Debridement Severity of Tissue Pre Debridement: Fat layer exposed Level of Consciousness (Pre-procedure): Awake and Alert Pre-procedure Verification/Time Out Yes - 09:05 Taken: Start Time: 09:05 T Area Debrided (L x W): otal 0.3 (cm) x 0.2 (cm) = 0.06 (cm) Tissue and other material debrided: Viable, Non-Viable, Callus, Subcutaneous, Skin: Epidermis Level: Skin/Subcutaneous Tissue Debridement Description: Excisional Instrument: Curette Bleeding: Minimum Hemostasis Achieved: Pressure End Time: 09:07 Procedural Pain: 0 Post Procedural Pain: 0 Response to Treatment: Procedure was tolerated well Level of Consciousness (Post- Awake and Alert procedure): Post Debridement Measurements of Total Wound Length: (cm) 0.3 Width: (cm) 0.2 Depth: (cm) 0.3 Volume: (cm) 0.014 Character of Wound/Ulcer Post Debridement: Improved Severity of Tissue Post Debridement: Fat layer exposed Post Procedure Diagnosis Same as Pre-procedure Electronic Signature(s) Signed: 01/29/2021 4:58:30 PM By: Linton Ham MD Signed: 02/03/2021 4:44:07 PM By: Levan Hurst RN, BSN Entered By: Linton Ham on 01/29/2021 09:12:46 -------------------------------------------------------------------------------- Debridement Details Patient Name: Date of Service: [name] Hawthorne B. 01/29/2021 8:30 A M Medical Record Number:  [id number] Patient Account Number: [id number] Date of Birth/Sex: Treating RN: 09/09/1950 (70 y.o. [name] [name] Primary Care Provider: [name] Other Clinician: Referring Provider: Treating Provider/Extender: Lanier Clam in Treatment: 1 Debridement Performed for Assessment: Wound #3 Right,Plantar T Great oe Performed By: Physician Ricard Dillon., MD Debridement Type: Debridement Severity of Tissue Pre Debridement: Fat layer exposed Level of Consciousness (Pre-procedure): Awake and Alert Pre-procedure Verification/Time Out Yes - 09:05 Taken: Start Time: 09:05 T Area Debrided (L x W): otal 0.3 (cm) x 0.3 (cm) = 0.09 (cm) Tissue and other material debrided: Viable, Non-Viable, Callus, Subcutaneous, Skin: Epidermis Level: Skin/Subcutaneous Tissue Debridement Description: Excisional Instrument: Curette Bleeding: Minimum Hemostasis Achieved: Pressure End Time: 09:07 Procedural Pain: 0 Post Procedural Pain: 0 Response to Treatment: Procedure was tolerated well Level of Consciousness (Post- Awake and Alert procedure): Post Debridement Measurements of Total Wound Length: (cm) 0.3 Width: (cm) 0.3 Depth: (cm) 0.4 Volume: (cm) 0.028 Character of Wound/Ulcer Post Debridement: Improved Severity of Tissue Post Debridement: Fat layer exposed Post Procedure Diagnosis Same as Pre-procedure Electronic Signature(s) Signed: 01/29/2021 4:58:30 PM By: Linton Ham MD Signed: 02/03/2021 4:44:07 PM By: Levan Hurst RN, BSN Entered By: Linton Ham on 01/29/2021 09:12:54 -------------------------------------------------------------------------------- Debridement Details Patient Name: Date of Service: [name] Hawthorne B. 01/29/2021 8:30 A M Medical Record Number: [id number] Patient Account Number: [id number] Date of Birth/Sex: Treating RN: 12/10/1950 (70 y.o. [name] [name] Primary Care Provider: [name] Other Clinician: Referring  Provider: Treating Provider/Extender: Lanier Clam in Treatment: 1 Debridement Performed for Assessment: Wound #4 Right T Third oe Performed By: Physician Ricard Dillon., MD Debridement Type: Debridement Severity of Tissue Pre Debridement: Fat layer exposed Level of Consciousness (Pre-procedure): Awake and Alert Pre-procedure Verification/Time Out Yes - 09:05 Taken: Start Time: 09:05 T Area Debrided (L x W): otal 0.2 (cm) x 0.2 (cm) = 0.04 (cm) Tissue and other material debrided: Non-Viable, Eschar Level: Non-Viable Tissue Debridement Description: Selective/Open Wound Instrument: Curette Bleeding: Minimum Hemostasis Achieved: Pressure End Time:  09:07 Procedural Pain: 0 Post Procedural Pain: 0 Response to Treatment: Procedure was tolerated well Level of Consciousness (Post- Awake and Alert procedure): Post Debridement Measurements of Total Wound Length: (cm) 0.2 Width: (cm) 0.2 Depth: (cm) 0.1 Volume: (cm) 0.003 Character of Wound/Ulcer Post Debridement: Improved Severity of Tissue Post Debridement: Fat layer exposed Post Procedure Diagnosis Same as Pre-procedure Electronic Signature(s) Signed: 01/29/2021 4:58:30 PM By: Linton Ham MD Signed: 02/03/2021 4:44:07 PM By: Levan Hurst RN, BSN Entered By: Levan Hurst on 01/29/2021 09:14:48 -------------------------------------------------------------------------------- HPI Details Patient Name: Date of Service: [name] Hawthorne B. 01/29/2021 8:30 A M Medical Record Number: [id number] Patient Account Number: [id number] Date of Birth/Sex: Treating RN: 05/01/1951 (70 y.o. [name] [name] Primary Care Provider: [name] Other Clinician: Referring Provider: Treating Provider/Extender: Lanier Clam in Treatment: 1 History of Present Illness HPI Description: ADMISSION 01/22/2021 This is a 70 year old man with type 2 diabetes and diabetic neuropathy on metformin.  He has been referred to our clinic from the wound care center in [facility] by Dr. Adelina Mings for consideration of hyperbaric oxygen therapy. Looking through [facility] health link the patient has been followed by Dr. Rocco Serene since the fall of last year at which time he had a Wagner grade 2 wound on the plantar aspect of the right great toe. This deteriorated according to the patient in June when the toe began rapidly more swollen. Ultimately he required admission to hospital from 01/01/2021 through 01/07/2021 at which time he had streaking erythema up his foot. He had an MRI done on 12/26/2020 which showed medial plantar ulceration of the great toe to bone with diffuse soft tissue swelling of the great toe with enhancement. No fluid collection or hematoma there was osteomyelitis of the first proximal and distal phalanx no abscess. He also had small erosions of the first metatarsal head and first TMT joint suspicious for gout. Cultures grew group B strep and Prevotella. He was treated with IV Invanz and p.o. ciprofloxacin. In fact he was just stopped of the ciprofloxacin by infectious disease today although he remains on IV Invanz until the beginning of August. He is tolerating this well. There is been marked improvement in his wounds and he was referred for consideration of additional hyperbaric oxygen therapy. 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 Surgically removed in 2018 ABIs were done on 05/06/2020 which showed noncompressible waveforms ABIs were biphasic at the ankles bilaterally. TBI's were not done 01/29/2021; patient is here for follow-up from his admission last week. In the interim he started HBO yesterday which he tolerated well. He is still on his  IV antibiotics [Invanz]. We have been using endoform. He has wounds on the right great toe x3 and an abrasion injury on the dorsal right third toe Electronic Signature(s) Signed: 01/29/2021 4:58:30 PM By: Linton Ham MD Entered By: Linton Ham on 01/29/2021 09:14:06 -------------------------------------------------------------------------------- Physical Exam Details Patient Name: Date of Service: [name]. 01/29/2021 8:30 A M Medical Record Number: [id number] Patient Account Number: [id number] Date of Birth/Sex: Treating RN: June 12, 1951 (70 y.o. [name] [name] Primary Care Provider: [name] Other Clinician: Referring Provider: Treating Provider/Extender: Lanier Clam in Treatment: 1 Constitutional Patient is hypertensive.. Pulse regular and within target range for patient.Marland Kitchen Respirations regular, non-labored and within target range.. Temperature  is normal and within the target range for the patient.Marland Kitchen Appears in no distress. Notes Wound exam; the patient has 3 areas on the right great toe 1 dorsally 1 laterally and 1 plantar. All of these are in roughly the same condition very shallow but still some eschar around the surfaces I remove the eschar with a #3 curette able to get healthy looking wound beds with generally very little depth. Debridement on the plantar right first toe was more extensive in the subcutaneous tissue. I one-point these all would have connected to underlying bone but substantially better The right third toe had adherent eschar as well I also removed with a #3 curette Electronic Signature(s) Signed: 01/29/2021 4:58:30 PM By: Linton Ham MD Entered By: Linton Ham on 01/29/2021 09:20:05 -------------------------------------------------------------------------------- Physician Orders Details Patient Name: Date of Service: [name] Hawthorne B. 01/29/2021 8:30 A M Medical Record Number: [id number] Patient Account  Number: [id number] Date of Birth/Sex: Treating RN: 11-Jan-1951 (70 y.o. [name] [name] Primary Care Provider: [name] Other Clinician: Referring Provider: Treating Provider/Extender: Lanier Clam in Treatment: 1 Verbal / Phone Orders: No Diagnosis Coding ICD-10 Coding Code Description E11.621 Type 2 diabetes mellitus with foot ulcer L97.518 Non-pressure chronic ulcer of other part of right foot with other specified severity L97.511 Non-pressure chronic ulcer of other part of right foot limited to breakdown of skin M86.371 Chronic multifocal osteomyelitis, right ankle and foot E11.621 Type 2 diabetes mellitus with foot ulcer Follow-up Appointments ppointment in 1 week. - with Dr. Dellia Nims Return A Bathing/ Shower/ Hygiene May shower and wash wound with soap and water. - prior to dressing change Off-Loading Open toe surgical shoe to: - right foot Hyperbaric Oxygen Therapy Evaluate for HBO Therapy Indication: - Wagner 3 diabetic ulcer right great toe If appropriate for treatment, begin HBOT per protocol: 2.5 ATA for 90 Minutes with 2 Five (5) Minute A Breaks ir Total Number of Treatments: - 40 One treatments per day (delivered Monday through Friday unless otherwise specified in Special Instructions below): Finger stick Blood Glucose Pre- and Post- HBOT Treatment. Follow Hyperbaric Oxygen Glycemia Protocol A frin (Oxymetazoline HCL) 0.05% nasal spray - 1 spray in both nostrils daily as needed prior to HBO treatment for difficulty clearing ears Wound Treatment Wound #1 - T Great oe Wound Laterality: Dorsal, Right Cleanser: Soap and Water 1 x Per Day/7 Days Discharge Instructions: May shower and wash wound with dial antibacterial soap and water prior to dressing change. Prim Dressing: Iodoform packing strip 1/4 (in) 1 x Per Day/7 Days ary Discharge Instructions: Lightly pack as instructed Secondary Dressing: Woven Gauze Sponges 2x2 in 1 x Per Day/7  Days Discharge Instructions: Apply over primary dressing as directed. Secured With: Child psychotherapist, Sterile 2x75 (in/in) 1 x Per Day/7 Days Discharge Instructions: Secure with stretch gauze as directed. Secured With: Paper Tape, 1x10 (in/yd) 1 x Per Day/7 Days Discharge Instructions: Secure dressing with tape as directed. Wound #2 - T Great oe Wound Laterality: Right, Medial Cleanser: Soap and Water 1 x Per Day/7 Days Discharge Instructions: May shower and wash wound with dial antibacterial soap and water prior to dressing change. Prim Dressing: Iodoform packing strip 1/4 (in) 1 x Per Day/7 Days ary Discharge Instructions: Lightly pack as instructed Secondary Dressing: Woven Gauze Sponges 2x2 in 1 x Per Day/7 Days Discharge Instructions: Apply over primary dressing as directed. Secured With: Child psychotherapist, Sterile 2x75 (in/in) 1 x Per Day/7 Days Discharge Instructions: Secure with stretch gauze  as directed. Secured With: Paper Tape, 1x10 (in/yd) 1 x Per Day/7 Days Discharge Instructions: Secure dressing with tape as directed. Wound #3 - T Great oe Wound Laterality: Plantar, Right Cleanser: Soap and Water 1 x Per Day/7 Days Discharge Instructions: May shower and wash wound with dial antibacterial soap and water prior to dressing change. Prim Dressing: Iodoform packing strip 1/4 (in) 1 x Per Day/7 Days ary Discharge Instructions: Lightly pack as instructed Secondary Dressing: Woven Gauze Sponges 2x2 in 1 x Per Day/7 Days Discharge Instructions: Apply over primary dressing as directed. Secured With: Child psychotherapist, Sterile 2x75 (in/in) 1 x Per Day/7 Days Discharge Instructions: Secure with stretch gauze as directed. Secured With: Paper Tape, 1x10 (in/yd) 1 x Per Day/7 Days Discharge Instructions: Secure dressing with tape as directed. Wound #4 - T Third oe Wound Laterality: Right Cleanser: Soap and Water 1 x Per Day/7  Days Discharge Instructions: May shower and wash wound with dial antibacterial soap and water prior to dressing change. Prim Dressing: KerraCel Ag Gelling Fiber Dressing, 2x2 in (silver alginate) 1 x Per Day/7 Days ary Discharge Instructions: Apply silver alginate to wound bed as instructed Prim Dressing: Iodoform packing strip 1/4 (in) 1 x Per Day/7 Days ary Discharge Instructions: Lightly pack as instructed Secondary Dressing: Woven Gauze Sponges 2x2 in 1 x Per Day/7 Days Discharge Instructions: Apply over primary dressing as directed. Secured With: Child psychotherapist, Sterile 2x75 (in/in) 1 x Per Day/7 Days Discharge Instructions: Secure with stretch gauze as directed. Secured With: Paper Tape, 1x10 (in/yd) 1 x Per Day/7 Days Discharge Instructions: Secure dressing with tape as directed. GLYCEMIA INTERVENTIONS PROTOCOL PRE-HBO GLYCEMIA INTERVENTIONS ACTION INTERVENTION Obtain pre-HBO capillary blood glucose (ensure 1 physician order is in chart). A. Notify HBO physician and await physician orders. 2 If result is 70 mg/dl or below: B. If the result meets the hospital definition of a critical result, follow hospital policy. A. Give patient an 8 ounce Glucerna Shake, an 8 ounce Ensure, or 8 ounces of a Glucerna/Ensure equivalent dietary supplement*. B. Wait 30 minutes. If result is 71 mg/dl to 130 mg/dl: C. Retest patients capillary blood glucose (CBG). D. If result greater than or equal to 110 mg/dl, proceed with HBO. If result less than 110 mg/dl, notify HBO physician and consider holding HBO. If result is 131 mg/dl to 249 mg/dl: A. Proceed with HBO. A. Notify HBO physician and await physician orders. B. It is recommended to hold HBO and do If result is 250 mg/dl or greater: blood/urine ketone testing. C. If the result meets the hospital definition of a critical result, follow hospital policy. POST-HBO GLYCEMIA INTERVENTIONS ACTION INTERVENTION Obtain  post HBO capillary blood glucose (ensure 1 physician order is in chart). A. Notify HBO physician and await physician orders. 2 If result is 70 mg/dl or below: B. If the result meets the hospital definition of a critical result, follow hospital policy. A. Give patient an 8 ounce Glucerna Shake, an 8 ounce Ensure, or 8 ounces of a Glucerna/Ensure equivalent dietary supplement*. B. Wait 15 minutes for symptoms of If result is 71 mg/dl to 100 mg/dl: hypoglycemia (i.e. nervousness, anxiety, sweating, chills, clamminess, irritability, confusion, tachycardia or dizziness). C. If patient asymptomatic, discharge patient. If patient symptomatic, repeat capillary blood glucose (CBG) and notify HBO physician. If result is 101 mg/dl to 249 mg/dl: A. Discharge patient. A. Notify HBO physician and await physician orders. B. It is recommended to do blood/urine ketone If result is 250 mg/dl or  greater: testing. C. If the result meets the hospital definition of a critical result, follow hospital policy. *Juice or candies are NOT equivalent products. If patient refuses the Glucerna or Ensure, please consult the hospital dietitian for an appropriate substitute. Electronic Signature(s) Signed: 01/29/2021 4:58:30 PM By: Linton Ham MD Signed: 02/03/2021 4:44:07 PM By: Levan Hurst RN, BSN Entered By: Levan Hurst on 01/29/2021 09:10:45 -------------------------------------------------------------------------------- Problem List Details Patient Name: Date of Service: [name] Hawthorne B. 01/29/2021 8:30 A M Medical Record Number: [id number] Patient Account Number: [id number] Date of Birth/Sex: Treating RN: August 01, 1950 (70 y.o. [name] [name] Primary Care Provider: [name] Other Clinician: Referring Provider: Treating Provider/Extender: Lanier Clam in Treatment: 1 Active Problems ICD-10 Encounter Code Description Active Date MDM Diagnosis E11.621  Type 2 diabetes mellitus with foot ulcer 01/22/2021 No Yes L97.518 Non-pressure chronic ulcer of other part of right foot with other specified 01/22/2021 No Yes severity L97.511 Non-pressure chronic ulcer of other part of right foot limited to breakdown of 01/22/2021 No Yes skin M86.371 Chronic multifocal osteomyelitis, right ankle and foot 01/22/2021 No Yes E11.621 Type 2 diabetes mellitus with foot ulcer 01/22/2021 No Yes Inactive Problems Resolved Problems Electronic Signature(s) Signed: 01/29/2021 4:58:30 PM By: Linton Ham MD Entered By: Linton Ham on 01/29/2021 09:12:16 -------------------------------------------------------------------------------- Progress Note Details Patient Name: Date of Service: [name] Hawthorne B. 01/29/2021 8:30 A M Medical Record Number: [id number] Patient Account Number: [id number] Date of Birth/Sex: Treating RN: [birth year] (70 y.o. [name] [name] Primary Care Provider: [name] Other Clinician: Referring Provider: Treating Provider/Extender: Lanier Clam in Treatment: 1 Subjective History of Present Illness (HPI) ADMISSION 01/22/2021 This is a 70 year old man with type 2 diabetes and diabetic neuropathy on metformin. He has been referred to our clinic from the wound care center in [facility] by Dr. Adelina Mings for consideration of hyperbaric oxygen therapy. Looking through [facility] health link the patient has been followed by Dr. Rocco Serene since the fall of last year at which time he had a Wagner grade 2 wound on the plantar aspect of the right great toe. This deteriorated according to the patient in June when the toe began rapidly more swollen. Ultimately he required admission to hospital from 01/01/2021 through 01/07/2021 at which time he had streaking erythema up his foot. He had an MRI done on 12/26/2020 which showed medial plantar ulceration of the great toe to bone with diffuse soft tissue swelling of the  great toe with enhancement. No fluid collection or hematoma there was osteomyelitis of the first proximal and distal phalanx no abscess. He also had small erosions of the first metatarsal head and first TMT joint suspicious for gout. Cultures grew group B strep and Prevotella. He was treated with IV Invanz and p.o. ciprofloxacin. In fact he was just stopped of the ciprofloxacin by infectious disease today although he remains on IV Invanz until the beginning of August. He is tolerating this well. There is been marked improvement in his wounds and he was referred for consideration of additional hyperbaric oxygen therapy. 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 Surgically removed in 2018 ABIs were done on 05/06/2020 which showed noncompressible waveforms ABIs were biphasic at the ankles bilaterally. TBI's were not done 01/29/2021; patient is here for follow-up from his admission last week. In  the interim he started HBO yesterday which he tolerated well. He is still on his IV antibiotics [Invanz]. We have been using endoform. He has wounds on the right great toe x3 and an abrasion injury on the dorsal right third toe Objective Constitutional Patient is hypertensive.. Pulse regular and within target range for patient.Marland Kitchen Respirations regular, non-labored and within target range.. Temperature is normal and within the target range for the patient.Marland Kitchen Appears in no distress. Vitals Time Taken: 8:36 AM, Height: 73 in, Weight: 222 lbs, BMI: 29.3, Temperature: 98.0 F, Pulse: 73 bpm, Respiratory Rate: 18 breaths/min, Blood Pressure: 148/75 mmHg. General Notes: Wound exam; the patient has 3 areas on the right great toe 1 dorsally 1 laterally and 1 plantar. All of these are in roughly the  same condition very shallow but still some eschar around the surfaces I remove the eschar with a #3 curette able to get healthy looking wound beds with generally very little depth. I one-point these all would have connected to underlying bone but substantially better Integumentary (Hair, Skin) Wound #1 status is Open. Original cause of wound was Gradually Appeared. The date acquired was: 12/11/2020. The wound has been in treatment 1 weeks. The wound is located on the Right,Dorsal T [place]. The wound measures 0.2cm length x 0.2cm width x 0.2cm depth; 0.031cm^2 area and 0.006cm^3 volume. oe There is no tunneling or undermining noted. There is a medium amount of serosanguineous drainage noted. The wound margin is distinct with the outline attached to the wound base. There is large (67-100%) red, pink granulation within the wound bed. There is no necrotic tissue within the wound bed. Wound #2 status is Open. Original cause of wound was Gradually Appeared. The date acquired was: 12/11/2020. The wound has been in treatment 1 weeks. The wound is located on the Right,Medial T Great. The wound measures 0.3cm length x 0.2cm width x 0.3cm depth; 0.047cm^2 area and 0.014cm^3 volume. oe There is no tunneling or undermining noted. There is a medium amount of serosanguineous drainage noted. The wound margin is distinct with the outline attached to the wound base. There is large (67-100%) red, pink granulation within the wound bed. There is no necrotic tissue within the wound bed. Wound #3 status is Open. Original cause of wound was Gradually Appeared. The date acquired was: 12/11/2020. The wound has been in treatment 1 weeks. The wound is located on the [employer]. The wound measures 0.3cm length x 0.3cm width x 0.4cm depth; 0.071cm^2 area and 0.028cm^3 volume. oe There is no tunneling or undermining noted. There is a medium amount of serosanguineous drainage noted. The wound margin is distinct with the  outline attached to the wound base. There is large (67-100%) red, pink granulation within the wound bed. There is no necrotic tissue within the wound bed. Wound #4 status is Open. Original cause of wound was Gradually Appeared. The date acquired was: 12/11/2020. The wound has been in treatment 1 weeks. The wound is located on the Right T Third. The wound measures 0.2cm length x 0.2cm width x 0.1cm depth; 0.031cm^2 area and 0.003cm^3 volume. There is no oe tunneling or undermining noted. There is a medium amount of serosanguineous drainage noted. The wound margin is distinct with the outline attached to the wound base. There is no granulation within the wound bed. There is a large (67-100%) amount of necrotic tissue within the wound bed including Eschar and Adherent Slough. Assessment Active Problems ICD-10 Type 2 diabetes mellitus with foot ulcer Non-pressure chronic  ulcer of other part of right foot with other specified severity Non-pressure chronic ulcer of other part of right foot limited to breakdown of skin Chronic multifocal osteomyelitis, right ankle and foot Type 2 diabetes mellitus with foot ulcer Procedures Wound #2 Pre-procedure diagnosis of Wound #2 is a Diabetic Wound/Ulcer of the Lower Extremity located on the Right,Medial T Great .Severity of Tissue Pre oe Debridement is: Fat layer exposed. There was a Excisional Skin/Subcutaneous Tissue Debridement with a total area of 0.06 sq cm performed by Ricard Dillon., MD. With the following instrument(s): Curette to remove Viable and Non-Viable tissue/material. Material removed includes Callus, Subcutaneous Tissue, and Skin: Epidermis. No specimens were taken. A time out was conducted at 09:05, prior to the start of the procedure. A Minimum amount of bleeding was controlled with Pressure. The procedure was tolerated well with a pain level of 0 throughout and a pain level of 0 following the procedure. Post Debridement Measurements:  0.3cm length x 0.2cm width x 0.3cm depth; 0.014cm^3 volume. Character of Wound/Ulcer Post Debridement is improved. Severity of Tissue Post Debridement is: Fat layer exposed. Post procedure Diagnosis Wound #2: Same as Pre-Procedure Wound #3 Pre-procedure diagnosis of Wound #3 is a Diabetic Wound/Ulcer of the Lower Extremity located on the Right,Plantar T Great .Severity of Tissue Pre oe Debridement is: Fat layer exposed. There was a Excisional Skin/Subcutaneous Tissue Debridement with a total area of 0.09 sq cm performed by Ricard Dillon., MD. With the following instrument(s): Curette to remove Viable and Non-Viable tissue/material. Material removed includes Callus, Subcutaneous Tissue, and Skin: Epidermis. No specimens were taken. A time out was conducted at 09:05, prior to the start of the procedure. A Minimum amount of bleeding was controlled with Pressure. The procedure was tolerated well with a pain level of 0 throughout and a pain level of 0 following the procedure. Post Debridement Measurements: 0.3cm length x 0.3cm width x 0.4cm depth; 0.028cm^3 volume. Character of Wound/Ulcer Post Debridement is improved. Severity of Tissue Post Debridement is: Fat layer exposed. Post procedure Diagnosis Wound #3: Same as Pre-Procedure Wound #4 Pre-procedure diagnosis of Wound #4 is a Diabetic Wound/Ulcer of the Lower Extremity located on the Right T Third .Severity of Tissue Pre Debridement is: oe Fat layer exposed. There was a Selective/Open Wound Non-Viable Tissue Debridement with a total area of 0.04 sq cm performed by Ricard Dillon., MD. With the following instrument(s): Curette to remove Non-Viable tissue/material. Material removed includes Eschar. No specimens were taken. A time out was conducted at 09:05, prior to the start of the procedure. A Minimum amount of bleeding was controlled with Pressure. The procedure was tolerated well with a pain level of 0 throughout and a pain level of 0  following the procedure. Post Debridement Measurements: 0.2cm length x 0.2cm width x 0.1cm depth; 0.003cm^3 volume. Character of Wound/Ulcer Post Debridement is improved. Severity of Tissue Post Debridement is: Fat layer exposed. Post procedure Diagnosis Wound #4: Same as Pre-Procedure Plan Follow-up Appointments: Return Appointment in 1 week. - with Dr. Dellia Nims Bathing/ Shower/ Hygiene: May shower and wash wound with soap and water. - prior to dressing change Off-Loading: Open toe surgical shoe to: - right foot Hyperbaric Oxygen Therapy: Evaluate for HBO Therapy Indication: - Wagner 3 diabetic ulcer right great toe If appropriate for treatment, begin HBOT per protocol: 2.5 ATA for 90 Minutes with 2 Five (5) Minute Air Breaks T Number of Treatments: - 40 otal One treatments per day (delivered Monday through Friday unless otherwise specified in  Special Instructions below): Finger stick Blood Glucose Pre- and Post- HBOT Treatment. Follow Hyperbaric Oxygen Glycemia Protocol Afrin (Oxymetazoline HCL) 0.05% nasal spray - 1 spray in both nostrils daily as needed prior to HBO treatment for difficulty clearing ears WOUND #1: - T Great Wound Laterality: Dorsal, Right oe Cleanser: Soap and Water 1 x Per Day/7 Days Discharge Instructions: May shower and wash wound with dial antibacterial soap and water prior to dressing change. Prim Dressing: Iodoform packing strip 1/4 (in) 1 x Per Day/7 Days ary Discharge Instructions: Lightly pack as instructed Secondary Dressing: Woven Gauze Sponges 2x2 in 1 x Per Day/7 Days Discharge Instructions: Apply over primary dressing as directed. Secured With: Child psychotherapist, Sterile 2x75 (in/in) 1 x Per Day/7 Days Discharge Instructions: Secure with stretch gauze as directed. Secured With: Paper T ape, 1x10 (in/yd) 1 x Per Day/7 Days Discharge Instructions: Secure dressing with tape as directed. WOUND #2: - T Great Wound Laterality: Right,  Medial oe Cleanser: Soap and Water 1 x Per Day/7 Days Discharge Instructions: May shower and wash wound with dial antibacterial soap and water prior to dressing change. Prim Dressing: Iodoform packing strip 1/4 (in) 1 x Per Day/7 Days ary Discharge Instructions: Lightly pack as instructed Secondary Dressing: Woven Gauze Sponges 2x2 in 1 x Per Day/7 Days Discharge Instructions: Apply over primary dressing as directed. Secured With: Child psychotherapist, Sterile 2x75 (in/in) 1 x Per Day/7 Days Discharge Instructions: Secure with stretch gauze as directed. Secured With: Paper T ape, 1x10 (in/yd) 1 x Per Day/7 Days Discharge Instructions: Secure dressing with tape as directed. WOUND #3: - T Great Wound Laterality: Plantar, Right oe Cleanser: Soap and Water 1 x Per Day/7 Days Discharge Instructions: May shower and wash wound with dial antibacterial soap and water prior to dressing change. Prim Dressing: Iodoform packing strip 1/4 (in) 1 x Per Day/7 Days ary Discharge Instructions: Lightly pack as instructed Secondary Dressing: Woven Gauze Sponges 2x2 in 1 x Per Day/7 Days Discharge Instructions: Apply over primary dressing as directed. Secured With: Child psychotherapist, Sterile 2x75 (in/in) 1 x Per Day/7 Days Discharge Instructions: Secure with stretch gauze as directed. Secured With: Paper T ape, 1x10 (in/yd) 1 x Per Day/7 Days Discharge Instructions: Secure dressing with tape as directed. WOUND #4: - T Third Wound Laterality: Right oe Cleanser: Soap and Water 1 x Per Day/7 Days Discharge Instructions: May shower and wash wound with dial antibacterial soap and water prior to dressing change. Prim Dressing: KerraCel Ag Gelling Fiber Dressing, 2x2 in (silver alginate) 1 x Per Day/7 Days ary Discharge Instructions: Apply silver alginate to wound bed as instructed Prim Dressing: Iodoform packing strip 1/4 (in) 1 x Per Day/7 Days ary Discharge Instructions: Lightly  pack as instructed Secondary Dressing: Woven Gauze Sponges 2x2 in 1 x Per Day/7 Days Discharge Instructions: Apply over primary dressing as directed. Secured With: Child psychotherapist, Sterile 2x75 (in/in) 1 x Per Day/7 Days Discharge Instructions: Secure with stretch gauze as directed. Secured With: Paper T ape, 1x10 (in/yd) 1 x Per Day/7 Days Discharge Instructions: Secure dressing with tape as directed. #1 continue with the endoform on the right great toe 2. Superficial injury which happened more recently on the dorsal right third toe we have been applying silver alginate 3. Continues with HBO tolerated his first treatment well yesterday Electronic Signature(s) Signed: 01/29/2021 4:58:30 PM By: Linton Ham MD Entered By: Linton Ham on 01/29/2021 09:18:12 -------------------------------------------------------------------------------- SuperBill Details Patient Name: Date of Service:  Bortle, [name] 01/29/2021 Medical Record Number: [id number] Patient Account Number: [id number] Date of Birth/Sex: Treating RN: Nov 21, 1950 (70 y.o. 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, right ankle and foot E11.621 Type 2 diabetes mellitus with foot ulcer Facility Procedures CPT4 Code: [id number] Description: [id number] - DEB SUBQ TISSUE 20 SQ CM/< ICD-10 Diagnosis Description L97.518 Non-pressure chronic ulcer of other part of right foot with other specified sever Modifier: ity Quantity: 1 CPT4 Code: [id number] Description: [id number] -  DEBRIDE WOUND 1ST 20 SQ CM OR < ICD-10 Diagnosis Description L97.511 Non-pressure chronic ulcer of other part of right foot limited to breakdown of sk Modifier: 59 in Quantity: 1 Physician Procedures : CPT4 Code Description Modifier [id number] - WC PHYS SUBQ TISS 20 SQ CM ICD-10 Diagnosis Description L97.518 Non-pressure chronic ulcer of other part of right foot with other specified severity Quantity: 1 : [id number] - WC PHYS DEBR WO ANESTH 20 SQ CM 59 ICD-10 Diagnosis Description L97.511 Non-pressure chronic ulcer of other part of right foot limited to breakdown of skin Quantity: 1 Electronic Signature(s) Signed: 01/29/2021 4:58:30 PM By: Linton Ham MD Entered By: Linton Ham on 01/29/2021 09:21:13

## 2021-02-03 NOTE — Progress Notes (Signed)
ADAMO, MERRITHEW ([id number]) Visit Report for 01/29/2021 Arrival Information Details Patient Name: Date of Service: [name] [name], [name] [name] 01/29/2021 8:30 A M Medical Record Number: [id number] Patient Account Number: [id number] Date of Birth/Sex: Treating RN: 10/09/1950 (70 y.o. [name] Primary Care Akiya Morr: [name] Other Clinician: Referring Zyona Pettaway: Treating Jerri Glauser/Extender: Lanier Clam in Treatment: 1 Visit Information History Since Last Visit Added or deleted any medications: No Patient Arrived: Ambulatory Any new allergies or adverse reactions: No Arrival Time: 08:35 Had a fall or experienced change in No Accompanied By: self activities of daily living that may affect Transfer Assistance: None risk of falls: Patient Identification Verified: Yes Signs or symptoms of abuse/neglect since last visito No Secondary Verification Process Completed: Yes Hospitalized since last visit: No Patient Requires Transmission-Based Precautions: No Implantable device outside of the clinic excluding No Patient Has Alerts: Yes cellular tissue based products placed in the center Patient Alerts: R ABI non compressible since last visit: L ABI non compressible Has Dressing in Place as Prescribed: Yes Pain Present Now: No Electronic Signature(s) Signed: 01/29/2021 8:56:27 AM By: Sandre Kitty Entered By: Sandre Kitty on 01/29/2021 08:36:32 -------------------------------------------------------------------------------- Encounter Discharge Information Details Patient Name: Date of Service: [name] Hawthorne B. 01/29/2021 8:30 A M Medical Record Number: [id number] Patient Account Number: [id number] Date of Birth/Sex: Treating RN: 1950-09-13 (70 y.o. [name], [name] Primary Care Rishawn Walck: [name] Other Clinician: Referring Alicianna Litchford: Treating Brenten Janney/Extender: Lanier Clam in Treatment: 1 Encounter Discharge  Information Items Post Procedure Vitals Discharge Condition: Stable Temperature (F): 98.9 Ambulatory Status: Ambulatory Pulse (bpm): 74 Discharge Destination: Home Respiratory Rate (breaths/min): 17 Transportation: Private Auto Blood Pressure (mmHg): 134/74 Accompanied By: self Schedule Follow-up Appointment: Yes Clinical Summary of Care: Patient Declined Electronic Signature(s) Signed: 01/30/2021 7:48:44 PM By: Rhae Hammock RN Entered By: Rhae Hammock on 01/29/2021 10:31:58 -------------------------------------------------------------------------------- Lower Extremity Assessment Details Patient Name: Date of Service: [name] [name] 01/29/2021 8:30 A M Medical Record Number: [id number] Patient Account Number: [id number] Date of Birth/Sex: Treating RN: [birth year] (70 y.o. [name], [name] Primary Care Basilia Stuckert: [name] Other Clinician: Referring Takhia Spoon: Treating Elisia Stepp/Extender: Lanier Clam in Treatment: 1 Edema Assessment Assessed: Shirlyn Goltz: No] Patrice Paradise: Yes] Edema: [Left: Ye] [Right: s] Calf Left: Right: Point of Measurement: From Medial Instep 34 cm Ankle Left: Right: Point of Measurement: From Medial Instep 22 cm Vascular Assessment Pulses: Dorsalis Pedis Palpable: [Right:Yes] Posterior Tibial Palpable: [Right:Yes] Electronic Signature(s) Signed: 01/30/2021 7:48:44 PM By: Rhae Hammock RN Entered By: Rhae Hammock on 01/29/2021 08:56:25 -------------------------------------------------------------------------------- Multi Wound Chart Details Patient Name: Date of Service: [name] Hawthorne B. 01/29/2021 8:30 A M Medical Record Number: [id number] Patient Account Number: [id number] Date of Birth/Sex: Treating RN: Jul 30, 1950 (70 y.o. [name] Primary Care Ezekiel Menzer: [name] Other Clinician: Referring Jameil Whitmoyer: Treating Lucrecia Mcphearson/Extender: Lanier Clam in Treatment:  1 Vital Signs Height(in): 52 Pulse(bpm): 74 Weight(lbs): 98 Blood Pressure(mmHg): 148/75 Body Mass Index(BMI): 29 Temperature(F): 98.0 Respiratory Rate(breaths/min): 18 Photos: [1:No Photos Right, Dorsal T Great oe] [2:No Photos Right T Great oe] [3:No Photos Right T Great oe] Wound Location: [1:Gradually Appeared] [2:Gradually Appeared] [3:Gradually Appeared] Wounding Event: [1:Diabetic Wound/Ulcer of the Lower] [2:Diabetic Wound/Ulcer of the Lower] [3:Diabetic Wound/Ulcer of the Lower] Primary Etiology: [1:Extremity Hypertension, Type II Diabetes, Gout,] [2:Extremity Hypertension, Type II Diabetes, Gout,] [3:Extremity Hypertension, Type II Diabetes, Gout,] Comorbid History: [1:Osteomyelitis, Neuropathy 12/11/2020] [2:Osteomyelitis, Neuropathy 12/11/2020] [3:Osteomyelitis, Neuropathy 12/11/2020] Date Acquired: [1:1] [2:1] [3:1] Weeks of Treatment: [1:Open] [2:Open] [  3:Open] Wound Status: [1:0.2x0.2x0.2] [2:0.3x0.2x0.3] [3:0.3x0.3x0.4] Measurements L x W x D (cm) [1:0.031] [2:0.047] [3:0.071] A (cm) : rea [1:0.006] [2:0.014] [3:0.028] Volume (cm) : [1:-287.50%] [2:-51.60%] [3:-129.00%] % Reduction in A rea: [1:-50.00%] [2:36.40%] [3:-115.40%] % Reduction in Volume: [1:Grade 3] [2:Grade 3] [3:Grade 3] Classification: [1:Medium] [2:Medium] [3:Medium] Exudate A mount: [1:Serosanguineous] [2:Serosanguineous] [3:Serosanguineous] Exudate Type: [1:red, brown] [2:red, brown] [3:red, brown] Exudate Color: [1:Distinct, outline attached] [2:Distinct, outline attached] [3:Distinct, outline attached] Wound Margin: [1:Large (67-100%)] [2:Large (67-100%)] [3:Large (67-100%)] Granulation A mount: [1:Red, Pink] [2:Red, Pink] [3:Red, Pink] Granulation Quality: [1:None Present (0%)] [2:None Present (0%)] [3:None Present (0%)] Necrotic A mount: [1:N/A] [2:N/A] [3:N/A] Necrotic Tissue: [1:Fascia: No] [2:Fascia: No] [3:Fascia: No] Exposed Structures: [1:Fat Layer (Subcutaneous Tissue): No Tendon: No  Muscle: No Joint: No Bone: No None] [2:Fat Layer (Subcutaneous Tissue): No Tendon: No Muscle: No Joint: No Bone: No None] [3:Fat Layer (Subcutaneous Tissue): No Tendon: No Muscle: No Joint: No Bone: No  None] Epithelialization: [1:N/A] [2:Debridement - Excisional] [3:Debridement - Excisional] Debridement: Pre-procedure Verification/Time Out N/A [2:09:05] [3:09:05] Taken: [1:N/A] [2:Callus, Subcutaneous] [3:Callus, Subcutaneous] Tissue Debrided: [1:N/A] [2:Skin/Subcutaneous Tissue] [3:Skin/Subcutaneous Tissue] Level: [1:N/A] [2:0.06] [3:0.09] Debridement A (sq cm): [1:rea N/A] [2:Curette] [3:Curette] Instrument: [1:N/A] [2:Minimum] [3:Minimum] Bleeding: [1:N/A] [2:Pressure] [3:Pressure] Hemostasis A chieved: [1:N/A] [2:0] [3:0] Procedural Pain: [1:N/A] [2:0] [3:0] Post Procedural Pain: [1:N/A] [2:Procedure was tolerated well] [3:Procedure was tolerated well] Debridement Treatment Response: [1:N/A] [2:0.3x0.2x0.3] [3:0.3x0.3x0.4] Post Debridement Measurements L x W x D (cm) [1:N/A] [2:0.014] [3:0.028] Post Debridement Volume: (cm) [1:N/A] [2:Debridement] [3:Debridement] Wound Number: 4 N/A N/A Photos: No Photos N/A N/A Right T Third oe N/A N/A Wound Location: Gradually Appeared N/A N/A Wounding Event: Diabetic Wound/Ulcer of the Lower N/A N/A Primary Etiology: Extremity Hypertension, Type II Diabetes, Gout, N/A N/A Comorbid History: Osteomyelitis, Neuropathy 12/11/2020 N/A N/A Date Acquired: 1 N/A N/A Weeks of Treatment: Open N/A N/A Wound Status: 0.2x0.2x0.1 N/A N/A Measurements L x W x D (cm) 0.031 N/A N/A A (cm) : rea 0.003 N/A N/A Volume (cm) : 0.00% N/A N/A % Reduction in A rea: 0.00% N/A N/A % Reduction in Volume: Grade 2 N/A N/A Classification: Medium N/A N/A Exudate A mount: Serosanguineous N/A N/A Exudate Type: red, brown N/A N/A Exudate Color: Distinct, outline attached N/A N/A Wound Margin: None Present (0%) N/A N/A Granulation A mount: N/A N/A  N/A Granulation Quality: Large (67-100%) N/A N/A Necrotic A mount: Eschar, Adherent Slough N/A N/A Necrotic Tissue: Fascia: No N/A N/A Exposed Structures: Fat Layer (Subcutaneous Tissue): No Tendon: No Muscle: No Joint: No Bone: No None N/A N/A Epithelialization: Debridement - Selective/Open Wound N/A N/A Debridement: Pre-procedure Verification/Time Out 09:05 N/A N/A Taken: Necrotic/Eschar N/A N/A Tissue Debrided: Non-Viable Tissue N/A N/A Level: 0.04 N/A N/A Debridement A (sq cm): rea Curette N/A N/A Instrument: Minimum N/A N/A Bleeding: Pressure N/A N/A Hemostasis A chieved: 0 N/A N/A Procedural Pain: 0 N/A N/A Post Procedural Pain: Procedure was tolerated well N/A N/A Debridement Treatment Response: 0.2x0.2x0.1 N/A N/A Post Debridement Measurements L x W x D (cm) 0.003 N/A N/A Post Debridement Volume: (cm) N/A N/A N/A Procedures Performed: Treatment Notes Electronic Signature(s) Signed: 01/29/2021 4:58:30 PM By: Linton Ham MD Signed: 02/03/2021 4:44:07 PM By: Levan Hurst RN, BSN Entered By: Linton Ham on 01/29/2021 09:12:25 -------------------------------------------------------------------------------- Multi-Disciplinary Care Plan Details Patient Name: Date of Service: [name] Hawthorne B. 01/29/2021 8:30 A M Medical Record Number: [id number] Patient Account Number: [id number] Date of Birth/Sex: Treating RN: January 10, 1951 (70 y.o. [name] Primary Care Taydon Nasworthy: [name] Other  Clinician: Referring Tsugio Elison: Treating Zafirah Vanzee/Extender: Lanier Clam in Treatment: 1 Multidisciplinary Care Plan reviewed with physician Active Inactive HBO Nursing Diagnoses: Anxiety related to feelings of confinement associated with the hyperbaric oxygen chamber Anxiety related to knowledge deficit of hyperbaric oxygen therapy and treatment procedures Discomfort related to temperature and humidity changes inside hyperbaric  chamber Potential for barotraumas to ears, sinuses, teeth, and lungs or cerebral gas embolism related to changes in atmospheric pressure inside hyperbaric oxygen chamber Potential for oxygen toxicity seizures related to delivery of 100% oxygen at an increased atmospheric pressure Potential for pulmonary oxygen toxicity related to delivery of 100% oxygen at an increased atmospheric pressure Goals: Barotrauma will be prevented during HBO2 Date Initiated: 01/22/2021 T arget Resolution Date: 03/21/2021 Goal Status: Active Patient and/or family will be able to state/discuss factors appropriate to the management of their disease process during treatment Date Initiated: 01/22/2021 T arget Resolution Date: 03/21/2021 Goal Status: Active Patient will tolerate the hyperbaric oxygen therapy treatment Date Initiated: 01/22/2021 T arget Resolution Date: 03/21/2021 Goal Status: Active Patient will tolerate the internal climate of the chamber Date Initiated: 01/22/2021 T arget Resolution Date: 03/21/2021 Goal Status: Active Patient/caregiver will verbalize understanding of HBO goals, rationale, procedures and potential hazards Date Initiated: 01/22/2021 T arget Resolution Date: 03/21/2021 Goal Status: Active Signs and symptoms of pulmonary oxygen toxicity will be recognized and promptly addressed Date Initiated: 01/22/2021 T arget Resolution Date: 03/21/2021 Goal Status: Active Signs and symptoms of seizure will be recognized and promptly addressed ; seizing patients will suffer no harm Date Initiated: 01/22/2021 T arget Resolution Date: 03/21/2021 Goal Status: Active Interventions: Administer a five (5) minute air break for patient if signs and symptoms of seizure appear and notify the hyperbaric physician Administer decongestants, per physician orders, prior to HBO2 Administer the correct therapeutic gas delivery based on the patients needs and limitations, per physician order Assess and provide for patients  comfort related to the hyperbaric environment and equalization of middle ear Assess for signs and symptoms related to adverse events, including but not limited to confinement anxiety, pneumothorax, oxygen toxicity and baurotrauma Assess patient for any history of confinement anxiety Assess patient's knowledge and expectations regarding hyperbaric medicine and provide education related to the hyperbaric environment, goals of treatment and prevention of adverse events Implement protocols to decrease risk of pneumothorax in high risk patients Notes: Nutrition Nursing Diagnoses: Impaired glucose control: actual or potential Potential for alteratiion in Nutrition/Potential for imbalanced nutrition Goals: Patient/caregiver agrees to and verbalizes understanding of need to use nutritional supplements and/or vitamins as prescribed Date Initiated: 01/22/2021 Target Resolution Date: 02/21/2021 Goal Status: Active Patient/caregiver will maintain therapeutic glucose control Date Initiated: 01/22/2021 Target Resolution Date: 02/21/2021 Goal Status: Active Interventions: Assess HgA1c results as ordered upon admission and as needed Assess patient nutrition upon admission and as needed per policy Provide education on elevated blood sugars and impact on wound healing Provide education on nutrition Treatment Activities: Education provided on Nutrition : 01/22/2021 Notes: Wound/Skin Impairment Nursing Diagnoses: Impaired tissue integrity Knowledge deficit related to ulceration/compromised skin integrity Goals: Patient/caregiver will verbalize understanding of skin care regimen Date Initiated: 01/22/2021 Target Resolution Date: 02/21/2021 Goal Status: Active Interventions: Assess patient/caregiver ability to obtain necessary supplies Assess patient/caregiver ability to perform ulcer/skin care regimen upon admission and as needed Assess ulceration(s) every visit Provide education on ulcer and skin  care Notes: Electronic Signature(s) Signed: 02/03/2021 4:44:07 PM By: Levan Hurst RN, BSN Entered By: Levan Hurst on 01/29/2021 09:15:01 -------------------------------------------------------------------------------- Pain Assessment Details Patient  Name: Date of Service: [name] [name], [name] [name] 01/29/2021 8:30 A M Medical Record Number: [id number] Patient Account Number: [id number] Date of Birth/Sex: Treating RN: 27-Jul-1950 (70 y.o. [name] Primary Care Josephanthony Tindel: [name] Other Clinician: Referring Sami Froh: Treating Francely Craw/Extender: Lanier Clam in Treatment: 1 Active Problems Location of Pain Severity and Description of Pain Patient Has Paino No Patient Has Paino No Site Locations Pain Management and Medication Current Pain Management: Electronic Signature(s) Signed: 01/29/2021 8:56:27 AM By: Sandre Kitty Signed: 02/03/2021 4:44:07 PM By: Levan Hurst RN, BSN Entered By: Sandre Kitty on 01/29/2021 08:37:00 -------------------------------------------------------------------------------- Patient/Caregiver Education Details Patient Name: Date of Service: [name] [name] 7/20/2022andnbsp8:30 [place] Record Number: [id number] Patient Account Number: [id number] Date of Birth/Gender: Treating RN: [birth year] (70 y.o. [name] Primary Care Physician: [name] Other Clinician: Referring Physician: Treating Physician/Extender: Lanier Clam in Treatment: 1 Education Assessment Education Provided To: Patient Education Topics Provided Wound/Skin Impairment: Methods: Explain/Verbal Responses: State content correctly Electronic Signature(s) Signed: 02/03/2021 4:44:07 PM By: Levan Hurst RN, BSN Entered By: Levan Hurst on 01/29/2021 09:17:46 -------------------------------------------------------------------------------- Wound Assessment Details Patient Name: Date of  Service: [name] [name] 01/29/2021 8:30 A M Medical Record Number: [id number] Patient Account Number: [id number] Date of Birth/Sex: Treating RN: [birth year] (70 y.o. [name] Primary Care Valerian Jewel: Other Clinician: [name] Referring Annick Dimaio: Treating Deosha Werden/Extender: Lanier Clam in Treatment: 1 Wound Status Wound Number: 1 Primary Diabetic Wound/Ulcer of the Lower Extremity Etiology: Wound Location: Right, Dorsal T Great oe Wound Status: Open Wounding Event: Gradually Appeared Comorbid Hypertension, Type II Diabetes, Gout, Osteomyelitis, Date Acquired: 12/11/2020 History: Neuropathy Weeks Of Treatment: 1 Clustered Wound: No Photos Photo Uploaded By: Donavan Burnet on 01/30/2021 14:31:27 Wound Measurements Length: (cm) 0.2 Width: (cm) 0.2 Depth: (cm) 0.2 Area: (cm) 0.031 Volume: (cm) 0.006 % Reduction in Area: -287.5% % Reduction in Volume: -50% Epithelialization: None Tunneling: No Undermining: No Wound Description Classification: Grade 3 Wound Margin: Distinct, outline attached Exudate Amount: Medium Exudate Type: Serosanguineous Exudate Color: red, brown Foul Odor After Cleansing: No Wound Bed Granulation Amount: Large (67-100%) Exposed Structure Granulation Quality: Red, Pink Fascia Exposed: No Necrotic Amount: None Present (0%) Fat Layer (Subcutaneous Tissue) Exposed: No Tendon Exposed: No Muscle Exposed: No Joint Exposed: No Bone Exposed: No Treatment Notes Wound #1 (Toe Great) Wound Laterality: Dorsal, Right Cleanser Soap and Water Discharge Instruction: May shower and wash wound with dial antibacterial soap and water prior to dressing change. Peri-Wound Care Topical Primary Dressing Iodoform packing strip 1/4 (in) Discharge Instruction: Lightly pack as instructed Secondary Dressing Woven Gauze Sponges 2x2 in Discharge Instruction: Apply over primary dressing as directed. Secured With Conforming  Stretch Gauze Bandage, Sterile 2x75 (in/in) Discharge Instruction: Secure with stretch gauze as directed. Paper Tape, 1x10 (in/yd) Discharge Instruction: Secure dressing with tape as directed. Compression Wrap Compression Stockings Add-Ons Electronic Signature(s) Signed: 01/30/2021 7:48:44 PM By: Rhae Hammock RN Signed: 02/03/2021 4:44:07 PM By: Levan Hurst RN, BSN Previous Signature: 01/29/2021 8:56:27 AM Version By: Sandre Kitty Entered By: Rhae Hammock on 01/29/2021 08:56:44 -------------------------------------------------------------------------------- Wound Assessment Details Patient Name: Date of Service: [name] Hawthorne B. 01/29/2021 8:30 A M Medical Record Number: [id number] Patient Account Number: [id number] Date of Birth/Sex: Treating RN: 1951/01/01 (70 y.o. [name] Primary Care Maximina Pirozzi: [name] Other Clinician: Referring Juanette Urizar: Treating [name]/Extender: Lanier Clam in Treatment: 1 Wound Status Wound Number: 2 Primary Diabetic Wound/Ulcer of the Lower Extremity Etiology: Wound Location:  Right T Great oe Wound Status: Open Wounding Event: Gradually Appeared Comorbid Hypertension, Type II Diabetes, Gout, Osteomyelitis, Date Acquired: 12/11/2020 History: Neuropathy Weeks Of Treatment: 1 Clustered Wound: No Photos Photo Uploaded By: Donavan Burnet on 01/30/2021 14:30:18 Wound Measurements Length: (cm) 0.3 Width: (cm) 0.2 Depth: (cm) 0.3 Area: (cm) 0.047 Volume: (cm) 0.014 % Reduction in Area: -51.6% % Reduction in Volume: 36.4% Epithelialization: None Tunneling: No Undermining: No Wound Description Classification: Grade 3 Wound Margin: Distinct, outline attached Exudate Amount: Medium Exudate Type: Serosanguineous Exudate Color: red, brown Foul Odor After Cleansing: No Slough/Fibrino No Wound Bed Granulation Amount: Large (67-100%) Exposed Structure Granulation Quality: Red, Pink Fascia  Exposed: No Necrotic Amount: None Present (0%) Fat Layer (Subcutaneous Tissue) Exposed: No Tendon Exposed: No Muscle Exposed: No Joint Exposed: No Bone Exposed: No Electronic Signature(s) Signed: 01/30/2021 7:48:44 PM By: Rhae Hammock RN Signed: 02/03/2021 4:44:07 PM By: Levan Hurst RN, BSN Previous Signature: 01/29/2021 8:56:27 AM Version By: Sandre Kitty Entered By: Rhae Hammock on 01/29/2021 08:57:01 -------------------------------------------------------------------------------- Wound Assessment Details Patient Name: Date of Service: [name] Hawthorne B. 01/29/2021 8:30 A M Medical Record Number: [id number] Patient Account Number: [id number] Date of Birth/Sex: Treating RN: 06/06/1951 (70 y.o. [name] Primary Care Wagner Tanzi: [name] Other Clinician: Referring Dalen Henn[place]: Treating Bitania Shankland/Extender: Lanier Clam in Treatment: 1 Wound Status Wound Number: 3 Primary Diabetic Wound/Ulcer of the Lower Extremity Etiology: Wound Location: Right, Plantar T Great oe Wound Status: Open Wounding Event: Gradually Appeared Comorbid Hypertension, Type II Diabetes, Gout, Osteomyelitis, Date Acquired: 12/11/2020 History: Neuropathy Weeks Of Treatment: 1 Clustered Wound: No Photos Photo Uploaded By: Donavan Burnet on 01/30/2021 14:26:38 Wound Measurements Length: (cm) 0.3 Width: (cm) 0.3 Depth: (cm) 0.4 Area: (cm) 0.071 Volume: (cm) 0.028 % Reduction in Area: -129% % Reduction in Volume: -115.4% Epithelialization: None Tunneling: No Undermining: No Wound Description Classification: Grade 3 Wound Margin: Distinct, outline attached Exudate Amount: Medium Exudate Type: Serosanguineous Exudate Color: red, brown Foul Odor After Cleansing: No Slough/Fibrino No Wound Bed Granulation Amount: Large (67-100%) Exposed Structure Granulation Quality: Red, Pink Fascia Exposed: No Necrotic Amount: None Present (0%) Fat Layer  (Subcutaneous Tissue) Exposed: No Tendon Exposed: No Muscle Exposed: No Joint Exposed: No Bone Exposed: No Treatment Notes Wound #3 (Toe Great) Wound Laterality: Plantar, Right Cleanser Soap and Water Discharge Instruction: May shower and wash wound with dial antibacterial soap and water prior to dressing change. Peri-Wound Care Topical Primary Dressing Iodoform packing strip 1/4 (in) Discharge Instruction: Lightly pack as instructed Secondary Dressing Woven Gauze Sponges 2x2 in Discharge Instruction: Apply over primary dressing as directed. Secured With Conforming Stretch Gauze Bandage, Sterile 2x75 (in/in) Discharge Instruction: Secure with stretch gauze as directed. Paper Tape, 1x10 (in/yd) Discharge Instruction: Secure dressing with tape as directed. Compression Wrap Compression Stockings Add-Ons Electronic Signature(s) Signed: 01/30/2021 5:06:24 PM By: Donavan Burnet EMT Signed: 02/03/2021 4:44:07 PM By: Levan Hurst RN, BSN Previous Signature: 01/29/2021 8:56:27 AM Version By: Sandre Kitty Entered By: Donavan Burnet on 01/30/2021 14:12:46 -------------------------------------------------------------------------------- Wound Assessment Details Patient Name: Date of Service: [name] Hawthorne B. 01/29/2021 8:30 A M Medical Record Number: [id number] Patient Account Number: [id number] Date of Birth/Sex: Treating RN: 03-Sep-1950 (70 y.o. [name] Primary Care Tenessa Marsee: [name] Other Clinician: Referring Jakira Mcfadden: Treating Lian Tanori/Extender: Lanier Clam in Treatment: 1 Wound Status Wound Number: 4 Primary Diabetic Wound/Ulcer of the Lower Extremity Etiology: Wound Location: Right T Third oe Wound Status: Open Wounding Event: Gradually Appeared Comorbid Hypertension, Type II Diabetes, Gout,  Osteomyelitis, Date Acquired: 12/11/2020 History: Neuropathy Weeks Of Treatment: 1 Clustered Wound: No Photos Photo Uploaded By:  Donavan Burnet on 01/30/2021 14:07:27 Wound Measurements Length: (cm) 0.2 Width: (cm) 0.2 Depth: (cm) 0.1 Area: (cm) 0.031 Volume: (cm) 0.003 % Reduction in Area: 0% % Reduction in Volume: 0% Epithelialization: None Tunneling: No Undermining: No Wound Description Classification: Grade 2 Wound Margin: Distinct, outline attached Exudate Amount: Medium Exudate Type: Serosanguineous Exudate Color: red, brown Foul Odor After Cleansing: No Slough/Fibrino Yes Wound Bed Granulation Amount: None Present (0%) Exposed Structure Necrotic Amount: Large (67-100%) Fascia Exposed: No Necrotic Quality: Eschar, Adherent Slough Fat Layer (Subcutaneous Tissue) Exposed: No Tendon Exposed: No Muscle Exposed: No Joint Exposed: No Bone Exposed: No Treatment Notes Wound #4 (Toe Third) Wound Laterality: Right Cleanser Soap and Water Discharge Instruction: May shower and wash wound with dial antibacterial soap and water prior to dressing change. Peri-Wound Care Topical Primary Dressing KerraCel Ag Gelling Fiber Dressing, 2x2 in (silver alginate) Discharge Instruction: Apply silver alginate to wound bed as instructed Iodoform packing strip 1/4 (in) Discharge Instruction: Lightly pack as instructed Secondary Dressing Woven Gauze Sponges 2x2 in Discharge Instruction: Apply over primary dressing as directed. Secured With Conforming Stretch Gauze Bandage, Sterile 2x75 (in/in) Discharge Instruction: Secure with stretch gauze as directed. Paper Tape, 1x10 (in/yd) Discharge Instruction: Secure dressing with tape as directed. Compression Wrap Compression Stockings Add-Ons Electronic Signature(s) Signed: 01/30/2021 7:48:44 PM By: Rhae Hammock RN Signed: 02/03/2021 4:44:07 PM By: Levan Hurst RN, BSN Previous Signature: 01/29/2021 8:56:27 AM Version By: Sandre Kitty Entered By: Rhae Hammock on 01/29/2021  08:58:33 -------------------------------------------------------------------------------- Vitals Details Patient Name: Date of Service: [name] Hawthorne B. 01/29/2021 8:30 A M Medical Record Number: [id number] Patient Account Number: [id number] Date of Birth/Sex: Treating RN: Jul 21, 1950 (70 y.o. [name] Primary Care Yaasir Menken: [name] Other Clinician: Referring Daizee Firmin: Treating Quantez Schnyder/Extender: Lanier Clam in Treatment: 1 Vital Signs Time Taken: 08:36 Temperature (F): 98.0 Height (in): 73 Pulse (bpm): 73 Weight (lbs): 222 Respiratory Rate (breaths/min): 18 Body Mass Index (BMI): 29.3 Blood Pressure (mmHg): 148/75 Reference Range: 80 - 120 mg / dl Electronic Signature(s) Signed: 01/29/2021 8:56:27 AM By: Sandre Kitty Entered By: Sandre Kitty on 01/29/2021 08:36:54

## 2021-02-03 NOTE — Progress Notes (Signed)
ZIQUAN, MUSCH ([id number]) Visit Report for 02/03/2021 SuperBill Details Patient Name: Date of Service: [name] [name], [name] [name] 02/03/2021 Medical Record Number: [id number] Patient Account Number: [id number] Date of Birth/Sex: Treating RN: 05-16-1951 (70 y.o. [name] Primary Care Provider: [name] Other Clinician: Donavan Burnet Referring Provider: Treating Provider/Extender: Lanier Clam in Treatment: 1 Diagnosis Coding ICD-10 Codes Code Description [phone] Type 2 diabetes mellitus with foot ulcer L97.518 Non-pressure chronic ulcer of other part of right foot with other specified severity L97.511 Non-pressure chronic ulcer of other part of right foot limited to breakdown of skin M86.371 Chronic multifocal osteomyelitis, right ankle and foot E11.621 Type 2 diabetes mellitus with foot ulcer Facility Procedures CPT4 Code Description Modifier Quantity [id number] G0277-(Facility Use Only) HBOT full body chamber, 17mn , 4 ICD-10 Diagnosis Description E11.621 Type 2 diabetes mellitus with foot ulcer L97.518 Non-pressure chronic ulcer of other part of right foot with other specified severity L97.511 Non-pressure chronic ulcer of other part of right foot limited to breakdown of skin M86.371 Chronic multifocal osteomyelitis, right ankle and foot Physician Procedures Quantity CPT4 Code Description Modifier [id number] PHYS HYPERBARIC OXYGEN THERAPY 1 ICD-10 Diagnosis Description E11.621 Type 2 diabetes mellitus with foot ulcer L97.518 Non-pressure chronic ulcer of other part of right foot with other specified severity L97.511 Non-pressure chronic ulcer of other part of right foot limited to breakdown of skin M86.371 Chronic multifocal osteomyelitis, right ankle and foot Electronic Signature(s) Signed: 02/03/2021 11:58:56 AM By: SDonavan BurnetEMT Signed: 02/03/2021 12:43:15 PM By: RLinton HamMD Entered By: SDonavan Burneton  02/03/2021 11:58:55

## 2021-02-04 ENCOUNTER — Encounter (HOSPITAL_BASED_OUTPATIENT_CLINIC_OR_DEPARTMENT_OTHER): Payer: Medicare HMO | Admitting: Internal Medicine

## 2021-02-04 DIAGNOSIS — M86371 Chronic multifocal osteomyelitis, right ankle and foot: Secondary | ICD-10-CM | POA: Diagnosis not present

## 2021-02-04 DIAGNOSIS — L97511 Non-pressure chronic ulcer of other part of right foot limited to breakdown of skin: Secondary | ICD-10-CM | POA: Diagnosis not present

## 2021-02-04 DIAGNOSIS — E11621 Type 2 diabetes mellitus with foot ulcer: Secondary | ICD-10-CM | POA: Diagnosis not present

## 2021-02-04 LAB — GLUCOSE, CAPILLARY
Glucose-Capillary: 233 mg/dL — ABNORMAL HIGH (ref 70–99)
Glucose-Capillary: 191 mg/dL — ABNORMAL HIGH (ref 70–99)

## 2021-02-05 ENCOUNTER — Encounter (HOSPITAL_BASED_OUTPATIENT_CLINIC_OR_DEPARTMENT_OTHER): Payer: Medicare HMO | Admitting: Physician Assistant

## 2021-02-05 ENCOUNTER — Other Ambulatory Visit: Payer: Self-pay

## 2021-02-05 DIAGNOSIS — M86371 Chronic multifocal osteomyelitis, right ankle and foot: Secondary | ICD-10-CM | POA: Diagnosis not present

## 2021-02-05 DIAGNOSIS — I25119 Atherosclerotic heart disease of native coronary artery with unspecified angina pectoris: Secondary | ICD-10-CM | POA: Diagnosis not present

## 2021-02-05 DIAGNOSIS — L97511 Non-pressure chronic ulcer of other part of right foot limited to breakdown of skin: Secondary | ICD-10-CM | POA: Diagnosis not present

## 2021-02-05 DIAGNOSIS — M869 Osteomyelitis, unspecified: Secondary | ICD-10-CM | POA: Diagnosis not present

## 2021-02-05 DIAGNOSIS — E119 Type 2 diabetes mellitus without complications: Secondary | ICD-10-CM | POA: Diagnosis not present

## 2021-02-05 DIAGNOSIS — I10 Essential (primary) hypertension: Secondary | ICD-10-CM | POA: Diagnosis not present

## 2021-02-05 DIAGNOSIS — E11621 Type 2 diabetes mellitus with foot ulcer: Secondary | ICD-10-CM | POA: Diagnosis not present

## 2021-02-05 LAB — GLUCOSE, CAPILLARY
Glucose-Capillary: 214 mg/dL — ABNORMAL HIGH (ref 70–99)
Glucose-Capillary: 152 mg/dL — ABNORMAL HIGH (ref 70–99)

## 2021-02-05 NOTE — Progress Notes (Signed)
DUC, REISCHMAN ([id number]) Visit Report for 02/05/2021 Problem List Details Patient Name: Date of Service: [name] [name], [name] [name] 02/05/2021 8:00 A M Medical Record Number: [id number] Patient Account Number: [id number] Date of Birth/Sex: Treating RN: 1950-12-25 (70 y.o. [name] Primary Care Provider: [name] Other Clinician: Donavan Burnet Referring Provider: Treating Provider/Extender: Tally Joe in Treatment: 2 Active Problems ICD-10 Encounter Code Description Active Date MDM Diagnosis E11.621 Type 2 diabetes mellitus with foot ulcer 01/22/2021 No Yes L97.518 Non-pressure chronic ulcer of other part of right foot with other specified 01/22/2021 No Yes severity L97.511 Non-pressure chronic ulcer of other part of right foot limited to breakdown of 01/22/2021 No Yes skin M86.371 Chronic multifocal osteomyelitis, right ankle and foot 01/22/2021 No Yes E11.621 Type 2 diabetes mellitus with foot ulcer 01/22/2021 No Yes Inactive Problems Resolved Problems Electronic Signature(s) Signed: 02/05/2021 4:28:53 PM By: Worthy Keeler PA-C Entered By: Worthy Keeler on 02/05/2021 16:28:53 -------------------------------------------------------------------------------- SuperBill Details Patient Name: Date of Service: [name] 02/05/2021 Medical Record Number: [id number] Patient Account Number: [id number] Date of Birth/Sex: Treating RN: 10/23/1950 (70 y.o. [name] Primary Care Provider: [name] Other Clinician: Donavan Burnet Referring Provider: Treating Provider/Extender: Tally Joe in Treatment: 2 Diagnosis Coding ICD-10 Codes Code Description [phone] Type 2 diabetes mellitus with foot ulcer L97.518 Non-pressure chronic ulcer of other part of right foot with other specified severity L97.511 Non-pressure chronic ulcer of other part of right foot limited to breakdown of skin M86.371  Chronic multifocal osteomyelitis, right ankle and foot E11.621 Type 2 diabetes mellitus with foot ulcer Facility Procedures CPT4 Code: [id number] Description: G0277-(Facility Use Only) HBOT full body chamber, 17mn , ICD-10 Diagnosis Description E11.621 Type 2 diabetes mellitus with foot ulcer L97.518 Non-pressure chronic ulcer of other part of right foot with other specified severity L97.511  Non-pressure chronic ulcer of other part of right foot limited to breakdown of skin M86.371 Chronic multifocal osteomyelitis, right ankle and foot Modifier: Quantity: 4 Physician Procedures : CPT4 Code Description Modifier [id number] PHYS HYPERBARIC OXYGEN THERAPY ICD-10 Diagnosis Description E11.621 Type 2 diabetes mellitus with foot ulcer L97.518 Non-pressure chronic ulcer of other part of right foot with other specified severity  L97.511 Non-pressure chronic ulcer of other part of right foot limited to breakdown of skin M86.371 Chronic multifocal osteomyelitis, right ankle and foot Quantity: 1 Electronic Signature(s) Signed: 02/05/2021 4:28:48 PM By: SWorthy KeelerPA-C Previous Signature: 02/05/2021 12:19:24 PM Version By: SDonavan BurnetEMT Entered By: SWorthy Keeleron 02/05/2021 [id number]

## 2021-02-05 NOTE — Progress Notes (Addendum)
BONIFACIO, WOLNER ([id number]) Visit Report for 02/03/2021 HBO Details Patient Name: Date of Service: CAILIN, TOOR 02/03/2021 8:00 A M Medical Record Number: [id number] Patient Account Number: [id number] Date of Birth/Sex: Treating RN: [birth year] (70 y.o. [name] Primary Care Nikolaj Geraghty: [name] Other Clinician: Donavan Burnet Referring Odilon Cass: Treating Gregor Dershem/Extender: Lanier Clam in Treatment: 1 HBO Treatment Course Details Treatment Course Number: 1 Ordering Chavonne Sforza: Bernerd Pho Treatments Ordered: otal 40 HBO Treatment Start Date: 01/27/2021 HBO Indication: Diabetic Ulcer(s) of the Lower Extremity HBO Treatment Details Treatment Number: 3 Patient Type: Outpatient Chamber Type: Monoplace Chamber Serial #: [id number] Treatment Protocol: 2.5 ATA with 90 minutes oxygen, with two 5 minute air breaks Treatment Details Compression Rate Down: 1.5 psi / minute De-Compression Rate Up: 2.0 psi / minute A breaks and breathing ir Compress Tx Pressure periods Decompress Decompress Begins Reached (leave unused spaces Begins Ends blank) Chamber Pressure (ATA 1 2.5 2.5 2.5 2.5 2.5 - - 2.5 1 ) Clock Time (24 hr) 08:56 09:12 09:42 09:47 10:17 10:22 - - 10:52 11:03 Treatment Length: 127 (minutes) Treatment Segments: 4 Vital Signs Capillary Blood Glucose Reference Range: 80 - 120 mg / dl HBO Diabetic Blood Glucose Intervention Range: <131 mg/dl or >249 mg/dl Type: Time Vitals Blood Pulse: Respiratory Temperature: Capillary Blood Glucose Pulse Action Taken: Pressure: Rate: Glucose (mg/dl): Meter #: Oximetry (%) Taken: Pre 08:09 118/67 68 18 98.4 111 8 oz Glucerna Shake given Pre 08:42 165 Cleared for HBO Treatment Post 11:07 134/48 50 18 97.7 118 discharged patient. HR<60, asymptomatic Treatment Response Treatment Toleration: Well Treatment Completion Status: Treatment Completed without Adverse Event Additional Procedure  Documentation Tissue Sevierity: Limited to breakdown of skin Electronic Signature(s) Signed: 02/05/2021 12:19:24 PM By: Donavan Burnet EMT Signed: 02/05/2021 4:50:28 PM By: Linton Ham MD Previous Signature: 02/03/2021 11:58:34 AM Version By: Donavan Burnet EMT Previous Signature: 02/03/2021 12:43:15 PM Version By: Linton Ham MD Entered By: Donavan Burnet on 02/05/2021 11:00:53 -------------------------------------------------------------------------------- HBO Safety Checklist Details Patient Name: Date of Service: [name] 02/03/2021 8:00 A M Medical Record Number: [id number] Patient Account Number: [id number] Date of Birth/Sex: Treating RN: 24-May-1951 (70 y.o. [name] Primary Care Dwanda Tufano: [name] Other Clinician: Donavan Burnet Referring Leor Whyte: Treating Yides Saidi/Extender: Lanier Clam in Treatment: 1 HBO Safety Checklist Items Safety Checklist Consent Form Signed Patient voided / foley secured and emptied When did you last eato 0700 snacks and meal substitute shake Last dose of injectable or oral agent 0630 Metformin Ostomy pouch emptied and vented if applicable NA All implantable devices assessed, documented and approved NA Intravenous access site secured and place Bandnet Valuables secured Linens and cotton and cotton/polyester blend (less than 51% polyester) Personal oil-based products / skin lotions / body lotions removed Wigs or hairpieces removed NA Smoking or tobacco materials removed NA Books / newspapers / magazines / loose paper removed Cologne, aftershave, perfume and deodorant removed Jewelry removed (may wrap wedding band) Make-up removed NA Hair care products removed Battery operated devices (external) removed Heating patches and chemical warmers removed Titanium eyewear removed Nail polish cured greater than 10 hours NA Casting material cured greater than 10 hours NA Hearing  aids removed NA Loose dentures or partials removed NA Prosthetics have been removed NA Patient demonstrates correct use of air break device (if applicable) Patient concerns have been addressed Patient grounding bracelet on and cord attached to chamber Specifics for Inpatients (complete in addition to above) Medication sheet sent with patient NA  Intravenous medications needed or due during therapy sent with patient NA Drainage tubes (e.g. nasogastric tube or chest tube secured and vented) NA Endotracheal or Tracheotomy tube secured NA Cuff deflated of air and inflated with saline NA Airway suctioned NA Electronic Signature(s) Signed: 02/05/2021 8:53:55 AM By: Donavan Burnet EMT Entered By: Donavan Burnet on 02/03/2021 09:18:25

## 2021-02-05 NOTE — Progress Notes (Signed)
RUARI, RODINO ([id number]) Visit Report for 02/04/2021 SuperBill Details Patient Name: Date of Service: [name] [name], [name] [name] 02/04/2021 Medical Record Number: [id number] Patient Account Number: [id number] Date of Birth/Sex: Treating RN: 06-Dec-1950 (70 y.o. [name], [name] Primary Care Provider: [name] Other Clinician: Donavan Burnet Referring Provider: Treating Provider/Extender: Lanier Clam in Treatment: 1 Diagnosis Coding ICD-10 Codes Code Description [phone] Type 2 diabetes mellitus with foot ulcer L97.518 Non-pressure chronic ulcer of other part of right foot with other specified severity L97.511 Non-pressure chronic ulcer of other part of right foot limited to breakdown of skin M86.371 Chronic multifocal osteomyelitis, right ankle and foot E11.621 Type 2 diabetes mellitus with foot ulcer Facility Procedures CPT4 Code Description Modifier Quantity [id number] G0277-(Facility Use Only) HBOT full body chamber, 8mn , 4 ICD-10 Diagnosis Description E11.621 Type 2 diabetes mellitus with foot ulcer L97.518 Non-pressure chronic ulcer of other part of right foot with other specified severity L97.511 Non-pressure chronic ulcer of other part of right foot limited to breakdown of skin M86.371 Chronic multifocal osteomyelitis, right ankle and foot Physician Procedures Quantity CPT4 Code Description Modifier [id number] PHYS HYPERBARIC OXYGEN THERAPY 1 ICD-10 Diagnosis Description E11.621 Type 2 diabetes mellitus with foot ulcer L97.518 Non-pressure chronic ulcer of other part of right foot with other specified severity L97.511 Non-pressure chronic ulcer of other part of right foot limited to breakdown of skin M86.371 Chronic multifocal osteomyelitis, right ankle and foot Electronic Signature(s) Signed: 02/04/2021 5:43:33 PM By: RLinton HamMD Signed: 02/05/2021 8:53:55 AM By: SDonavan BurnetEMT Entered By: SDonavan Burneton  02/04/2021 11:13:16

## 2021-02-05 NOTE — Progress Notes (Signed)
[name] [name] ([id number]) Visit Report for 02/04/2021 HBO Details Patient Name: Date of Service: [name] [name], [name] [name] 02/04/2021 8:00 A M Medical Record Number: [id number] Patient Account Number: [id number] Date of Birth/Sex: Treating RN: 02-Jan-1951 (70 y.o. [name] [name], [name] [name] Primary Care [name] [name]: [name] [name] Other Clinician: Donavan [name] Referring Layloni Fahrner: Treating [name] [name]/Extender: [name] [name] in Treatment: 1 HBO Treatment Course Details Treatment Course Number: 1 Ordering [name] [name]: [name] [name] Treatments Ordered: otal 40 HBO Treatment Start Date: 01/27/2021 HBO Indication: Diabetic Ulcer(s) of the Lower Extremity HBO Treatment Details Treatment Number: 4 Patient Type: Outpatient Chamber Type: Monoplace Chamber Serial #: [id number] Treatment Protocol: 2.5 ATA with 90 minutes oxygen, with two 5 minute air breaks Treatment Details Compression Rate Down: 2.0 psi / minute De-Compression Rate Up: 2.0 psi / minute A breaks and breathing ir Compress Tx Pressure periods Decompress Decompress Begins Reached (leave unused spaces Begins Ends blank) Chamber Pressure (ATA 1 2.5 2.5 2.5 2.5 2.5 - - 2.5 1 ) Clock Time (24 hr) 08:03 08:15 08:45 08:50 09:20 09:25 - - 09:55 10:06 Treatment Length: 123 (minutes) Treatment Segments: 4 Vital Signs Capillary Blood Glucose Reference Range: 80 - 120 mg / dl HBO Diabetic Blood Glucose Intervention Range: <131 mg/dl or >249 mg/dl Time Vitals Blood Respiratory Capillary Blood Glucose Pulse Action Type: Pulse: Temperature: Taken: Pressure: Rate: Glucose (mg/dl): Meter #: Oximetry (%) Taken: Pre 07:58 128/62 53 18 98.2 233 Post 10:10 135/59 48 16 97.6 191 Treatment Response Treatment Toleration: Well Treatment Completion Status: Treatment Completed without Adverse Event Additional Procedure Documentation Tissue Sevierity: Limited to breakdown of skin Roselynn Whitacre Notes No concerns with treatment  given Physician HBO Attestation: I certify that I supervised this HBO treatment in accordance with Medicare guidelines. A trained emergency response team is readily available per Yes hospital policies and procedures. Continue HBOT as ordered. Yes Electronic Signature(s) Signed: 02/04/2021 5:43:33 PM By: Linton Ham MD Entered By: Linton Ham on 02/04/2021 17:40:23 -------------------------------------------------------------------------------- HBO Safety Checklist Details Patient Name: Date of Service: [name] [name] B. 02/04/2021 8:00 A M Medical Record Number: [id number] Patient Account Number: [id number] Date of Birth/Sex: Treating RN: April 27, 1951 (70 y.o. [name] [name], [name] [name] Primary Care Lucero Ide: [name] [name] Other Clinician: Donavan [name] Referring Damarian Priola: Treating Gittel Mccamish/Extender: [name] [name] in Treatment: 1 HBO Safety Checklist Items Safety Checklist Consent Form Signed Patient voided / foley secured and emptied When did you last eato 0700 Last dose of injectable or oral agent Metformin Yesterday Ostomy pouch emptied and vented if applicable NA All implantable devices assessed, documented and approved NA Intravenous access site secured and place Band net Valuables secured Linens and cotton and cotton/polyester blend (less than 51% polyester) Personal oil-based products / skin lotions / body lotions removed Wigs or hairpieces removed NA Smoking or tobacco materials removed NA Books / newspapers / magazines / loose paper removed Cologne, aftershave, perfume and deodorant removed Jewelry removed (may wrap wedding band) Make-up removed NA Hair care products removed NA Battery operated devices (external) removed Heating patches and chemical warmers removed Titanium eyewear removed Nail polish cured greater than 10 hours NA Casting material cured greater than 10 hours NA Hearing aids removed NA Loose dentures or  partials removed NA Prosthetics have been removed NA Patient demonstrates correct use of air break device (if applicable) Patient concerns have been addressed Patient grounding bracelet on and cord attached to chamber Specifics for Inpatients (complete in addition to above) Medication sheet sent with patient NA Intravenous medications needed or due  during therapy sent with patient NA Drainage tubes (e.g. nasogastric tube or chest tube secured and vented) NA Endotracheal or Tracheotomy tube secured NA Cuff deflated of air and inflated with saline NA Airway suctioned NA Electronic Signature(s) Signed: 02/05/2021 8:53:55 AM By: [name] [name] EMT Entered By: [name] [name] on 02/04/2021 11:06:39

## 2021-02-05 NOTE — Progress Notes (Signed)
[name] [name], [name] [name] ([id number]) Visit Report for 02/05/2021 HBO Details Patient Name: Date of Service: [name] [name], [name] [name] 02/05/2021 8:00 A M Medical Record Number: [id number] Patient Account Number: [id number] Date of Birth/Sex: Treating RN: January 24, 1951 (70 y.o. [name] [name] Primary Care Pollyann Roa: [name] Other Clinician: Donavan Burnet Referring Jaisa Defino: Treating Syrianna Schillaci/Extender: Tally Joe in Treatment: 2 HBO Treatment Course Details Treatment Course Number: 1 Ordering Persais Ethridge: Bernerd Pho Treatments Ordered: otal 40 HBO Treatment Start Date: 01/27/2021 HBO Indication: Diabetic Ulcer(s) of the Lower Extremity HBO Treatment Details Treatment Number: 5 Patient Type: Outpatient Chamber Type: Monoplace Chamber Serial #: [id number] Treatment Protocol: 2.5 ATA with 90 minutes oxygen, with two 5 minute air breaks Treatment Details Compression Rate Down: 2.0 psi / minute De-Compression Rate Up: 2.0 psi / minute A breaks and breathing ir Compress Tx Pressure periods Decompress Decompress Begins Reached (leave unused spaces Begins Ends blank) Chamber Pressure (ATA 1 2.5 2.5 2.5 2.5 2.5 - - 2.5 1 ) Clock Time (24 hr) 08:11 08:22 08:52 08:57 09:27 09:32 - - 10:02 10:14 Treatment Length: 123 (minutes) Treatment Segments: 4 Vital Signs Capillary Blood Glucose Reference Range: 80 - 120 mg / dl HBO Diabetic Blood Glucose Intervention Range: <131 mg/dl or >249 mg/dl Type: Time Vitals Blood Pulse: Respiratory Temperature: Capillary Blood Glucose Pulse Action Taken: Pressure: Rate: Glucose (mg/dl): Meter #: Oximetry (%) Taken: Pre 07:53 116/67 54 18 98.1 214 Post 10:20 141/64 50 16 98 152 Patient states he feels good (BP <60bpm) Treatment Response Treatment Toleration: Well Treatment Completion Status: Treatment Completed without Adverse Event Additional Procedure Documentation Tissue Sevierity: Limited to breakdown of skin Physician HBO  Attestation: I certify that I supervised this HBO treatment in accordance with Medicare guidelines. A trained emergency response team is readily available per Yes hospital policies and procedures. Continue HBOT as ordered. Yes Electronic Signature(s) Signed: 02/05/2021 4:28:43 PM By: Worthy Keeler PA-C Previous Signature: 02/05/2021 12:22:56 PM Version By: Donavan Burnet EMT Previous Signature: 02/05/2021 12:16:48 PM Version By: Donavan Burnet EMT Entered By: Worthy Keeler on 02/05/2021 16:28:42 -------------------------------------------------------------------------------- HBO Safety Checklist Details Patient Name: Date of Service: [name] Hawthorne B. 02/05/2021 8:00 A M Medical Record Number: [id number] Patient Account Number: [id number] Date of Birth/Sex: Treating RN: 27-Nov-1950 (70 y.o. [name] [name] Primary Care Jaydien Panepinto: [name] Other Clinician: Donavan Burnet Referring Yazmyn Valbuena: Treating Amity Roes/Extender: Tally Joe in Treatment: 2 HBO Safety Checklist Items Safety Checklist Consent Form Signed Patient voided / foley secured and emptied When did you last eato 0715 Last dose of injectable or oral agent Yesterday post HBO Ostomy pouch emptied and vented if applicable NA All implantable devices assessed, documented and approved NA Intravenous access site secured and place band net Valuables secured Linens and cotton and cotton/polyester blend (less than 51% polyester) Personal oil-based products / skin lotions / body lotions removed Wigs or hairpieces removed NA Smoking or tobacco materials removed NA Books / newspapers / magazines / loose paper removed Cologne, aftershave, perfume and deodorant removed Jewelry removed (may wrap wedding band) Make-up removed NA Hair care products removed NA Battery operated devices (external) removed Heating patches and chemical warmers removed Titanium eyewear removed Nail polish  cured greater than 10 hours NA Casting material cured greater than 10 hours NA Hearing aids removed NA Loose dentures or partials removed NA Prosthetics have been removed NA Patient demonstrates correct use of air break device (if applicable) Patient concerns have been addressed Patient grounding bracelet  on and cord attached to chamber Specifics for Inpatients (complete in addition to above) Medication sheet sent with patient NA Intravenous medications needed or due during therapy sent with patient NA Drainage tubes (e.g. nasogastric tube or chest tube secured and vented) NA Endotracheal or Tracheotomy tube secured NA Cuff deflated of air and inflated with saline NA Airway suctioned NA Electronic Signature(s) Signed: 02/05/2021 12:19:24 PM By: Donavan Burnet EMT Entered By: Donavan Burnet on 02/05/2021 09:08:56

## 2021-02-05 NOTE — Progress Notes (Signed)
VIRAAT, SOUDER ([id number]) Visit Report for 02/04/2021 Arrival Information Details Patient Name: Date of Service: [name] [name], [name] [name] 02/04/2021 8:00 A M Medical Record Number: [id number] Patient Account Number: [id number] Date of Birth/Sex: Treating RN: Oct 07, 1950 (70 y.o. [name], [name] Primary Care [name] [name]: [name] Other Clinician: Donavan Burnet Referring Batool Majid: Treating Joelee Snoke/Extender: Lanier Clam in Treatment: 1 Visit Information History Since Last Visit All ordered tests and consults were completed: Yes Patient Arrived: Ambulatory Added or deleted any medications: No Arrival Time: 07:40 Any new allergies or adverse reactions: No Accompanied By: Self Had a fall or experienced change in No Transfer Assistance: None activities of daily living that may affect Patient Identification Verified: Yes risk of falls: Secondary Verification Process Completed: Yes Signs or symptoms of abuse/neglect since last visito No Patient Requires Transmission-Based Precautions: No Hospitalized since last visit: No Patient Has Alerts: Yes Implantable device outside of the clinic excluding No Patient Alerts: R ABI non compressible cellular tissue based products placed in the center L ABI non compressible since last visit: Pain Present Now: No Electronic Signature(s) Signed: 02/05/2021 8:53:55 AM By: Donavan Burnet EMT Entered By: Donavan Burnet on 02/04/2021 11:01:20 -------------------------------------------------------------------------------- Encounter Discharge Information Details Patient Name: Date of Service: [name] Hawthorne B. 02/04/2021 8:00 A M Medical Record Number: [id number] Patient Account Number: [id number] Date of Birth/Sex: Treating RN: Nov 26, 1950 (70 y.o. [name], [name] Primary Care [name] [name]: [name] Other Clinician: Donavan Burnet Referring Matraca Hunkins: Treating Meldon Hanzlik/Extender: Lanier Clam in Treatment: 1 Encounter Discharge Information Items Discharge Condition: Stable Ambulatory Status: Ambulatory Discharge Destination: Other (Note Required) Transportation: Private Auto Accompanied By: Self Schedule Follow-up Appointment: No Clinical Summary of Care: Notes Patient goes to work after treatment. Electronic Signature(s) Signed: 02/05/2021 8:53:55 AM By: Donavan Burnet EMT Entered By: Donavan Burnet on 02/04/2021 11:14:53 -------------------------------------------------------------------------------- Vitals Details Patient Name: Date of Service: [name] Hawthorne B. 02/04/2021 8:00 A M Medical Record Number: [id number] Patient Account Number: [id number] Date of Birth/Sex: Treating RN: 09-18-1950 (70 y.o. [name], [name] Primary Care Cutler Sunday: [name] Other Clinician: Donavan Burnet Referring Laniah Grimm: Treating Annmargaret Decaprio/Extender: Lanier Clam in Treatment: 1 Vital Signs Time Taken: 07:58 Temperature (F): 98.2 Height (in): 73 Pulse (bpm): 53 Weight (lbs): 222 Respiratory Rate (breaths/min): 18 Body Mass Index (BMI): 29.3 Blood Pressure (mmHg): 128/62 Capillary Blood Glucose (mg/dl): 233 Reference Range: 80 - 120 mg / dl Electronic Signature(s) Signed: 02/05/2021 8:53:55 AM By: Donavan Burnet EMT Entered By: Donavan Burnet on 02/04/2021 11:03:32

## 2021-02-05 NOTE — Progress Notes (Signed)
MEHTAAB, KRIM ([id number]) Visit Report for 02/03/2021 Arrival Information Details Patient Name: Date of Service: [name] [name], [name] [name] 02/03/2021 8:00 A M Medical Record Number: [id number] Patient Account Number: [id number] Date of Birth/Sex: Treating RN: 1950/08/31 (70 y.o. [name] Primary Care Trino Higinbotham: [name] Other Clinician: Donavan Burnet Referring Kobie Matkins: Treating Lehman Whiteley/Extender: Lanier Clam in Treatment: 1 Visit Information History Since Last Visit All ordered tests and consults were completed: Yes Patient Arrived: Ambulatory Added or deleted any medications: No Arrival Time: 07:45 Any new allergies or adverse reactions: No Accompanied By: self Had a fall or experienced change in No Transfer Assistance: None activities of daily living that may affect Patient Identification Verified: Yes risk of falls: Secondary Verification Process Completed: Yes Signs or symptoms of abuse/neglect since last visito No Patient Requires Transmission-Based Precautions: No Hospitalized since last visit: No Patient Has Alerts: Yes Implantable device outside of the clinic excluding No Patient Alerts: R ABI non compressible cellular tissue based products placed in the center L ABI non compressible since last visit: Pain Present Now: No Electronic Signature(s) Signed: 02/05/2021 8:53:55 AM By: Donavan Burnet EMT Entered By: Donavan Burnet on 02/03/2021 09:14:24 -------------------------------------------------------------------------------- Encounter Discharge Information Details Patient Name: Date of Service: [name] Hawthorne B. 02/03/2021 8:00 A M Medical Record Number: [id number] Patient Account Number: [id number] Date of Birth/Sex: Treating RN: 1951/03/16 (70 y.o. [name] Primary Care Judyth Demarais: [name] Other Clinician: Donavan Burnet Referring Rasul Decola: Treating Tameka Hoiland/Extender: Lanier Clam in Treatment: 1 Encounter Discharge Information Items Discharge Condition: Stable Ambulatory Status: Ambulatory Discharge Destination: Other (Note Required) Transportation: Private Auto Accompanied By: self Schedule Follow-up Appointment: No Clinical Summary of Care: Notes Patient states that he goes to work after treatments. Electronic Signature(s) Signed: 02/03/2021 11:59:57 AM By: Donavan Burnet EMT Entered By: Donavan Burnet on 02/03/2021 11:59:57 -------------------------------------------------------------------------------- Vitals Details Patient Name: Date of Service: [name] Hawthorne B. 02/03/2021 8:00 A M Medical Record Number: [id number] Patient Account Number: [id number] Date of Birth/Sex: Treating RN: Oct 13, 1950 (70 y.o. [name] Primary Care Denay Pleitez: [name] Other Clinician: Donavan Burnet Referring Weber Monnier: Treating Lameisha Schuenemann/Extender: Lanier Clam in Treatment: 1 Vital Signs Time Taken: 08:09 Temperature (F): 98.4 Height (in): 73 Pulse (bpm): 68 Weight (lbs): 222 Respiratory Rate (breaths/min): 18 Body Mass Index (BMI): 29.3 Blood Pressure (mmHg): 163/76 Capillary Blood Glucose (mg/dl): 111 Reference Range: 80 - 120 mg / dl Electronic Signature(s) Signed: 02/05/2021 8:53:55 AM By: Donavan Burnet EMT Entered By: Donavan Burnet on 02/03/2021 09:15:52

## 2021-02-05 NOTE — Progress Notes (Addendum)
DEVINN, LOSCALZO ([id number]) Visit Report for 02/05/2021 Arrival Information Details Patient Name: Date of Service: [name] [name], [name] [name] 02/05/2021 8:00 A M Medical Record Number: [id number] Patient Account Number: [id number] Date of Birth/Sex: Treating RN: [birth year] (70 y.o. [name] Primary Care Caylin Raby: [name] Other Clinician: Donavan Burnet Referring Shantae Vantol: Treating Jacob Chamblee/Extender: Tally Joe in Treatment: 2 Visit Information History Since Last Visit All ordered tests and consults were completed: Yes Patient Arrived: Ambulatory Added or deleted any medications: No Arrival Time: 07:30 Any new allergies or adverse reactions: No Accompanied By: self Had a fall or experienced change in No Transfer Assistance: None activities of daily living that may affect Patient Identification Verified: Yes risk of falls: Secondary Verification Process Completed: Yes Signs or symptoms of abuse/neglect since last visito No Patient Requires Transmission-Based Precautions: No Hospitalized since last visit: No Patient Has Alerts: Yes Implantable device outside of the clinic excluding No Patient Alerts: R ABI non compressible cellular tissue based products placed in the center L ABI non compressible since last visit: Pain Present Now: No Electronic Signature(s) Signed: 02/05/2021 12:19:24 PM By: Donavan Burnet EMT Entered By: Donavan Burnet on 02/05/2021 09:05:09 -------------------------------------------------------------------------------- Encounter Discharge Information Details Patient Name: Date of Service: [name] Hawthorne B. 02/05/2021 8:00 A M Medical Record Number: [id number] Patient Account Number: [id number] Date of Birth/Sex: Treating RN: 21-Nov-1950 (70 y.o. [name] Primary Care Nihira Puello: [name] Other Clinician: Donavan Burnet Referring Yolunda Kloos: Treating Alastair Hennes/Extender: Tally Joe in Treatment: 2 Encounter Discharge Information Items Discharge Condition: Stable Ambulatory Status: Ambulatory Discharge Destination: Other (Note Required) Transportation: Private Auto Accompanied By: self Schedule Follow-up Appointment: No Clinical Summary of Care: Notes Patient goes to work after treatment. Electronic Signature(s) Signed: 02/05/2021 12:19:24 PM By: Donavan Burnet EMT Entered By: Donavan Burnet on 02/05/2021 10:48:49 -------------------------------------------------------------------------------- Vitals Details Patient Name: Date of Service: [name] Hawthorne B. 02/05/2021 8:00 A M Medical Record Number: [id number] Patient Account Number: [id number] Date of Birth/Sex: Treating RN: Jan 22, 1951 (70 y.o. [name] Primary Care Olie Dibert: [name] Other Clinician: Donavan Burnet Referring Paizley Ramella: Treating Tameisha Covell/Extender: Tally Joe in Treatment: 2 Vital Signs Time Taken: 07:53 Temperature (F): 98.1 Height (in): 73 Pulse (bpm): 54 Weight (lbs): 222 Respiratory Rate (breaths/min): 18 Body Mass Index (BMI): 29.3 Blood Pressure (mmHg): 116/67 Capillary Blood Glucose (mg/dl): 214 Reference Range: 80 - 120 mg / dl Electronic Signature(s) Signed: 02/05/2021 12:23:13 PM By: Donavan Burnet EMT Previous Signature: 02/05/2021 12:16:11 PM Version By: Donavan Burnet EMT Entered By: Donavan Burnet on 02/05/2021 12:23:13

## 2021-02-06 ENCOUNTER — Encounter (HOSPITAL_BASED_OUTPATIENT_CLINIC_OR_DEPARTMENT_OTHER): Payer: Medicare HMO | Admitting: Internal Medicine

## 2021-02-06 DIAGNOSIS — L97511 Non-pressure chronic ulcer of other part of right foot limited to breakdown of skin: Secondary | ICD-10-CM | POA: Diagnosis not present

## 2021-02-06 DIAGNOSIS — E11621 Type 2 diabetes mellitus with foot ulcer: Secondary | ICD-10-CM

## 2021-02-06 DIAGNOSIS — M86371 Chronic multifocal osteomyelitis, right ankle and foot: Secondary | ICD-10-CM

## 2021-02-06 DIAGNOSIS — M869 Osteomyelitis, unspecified: Secondary | ICD-10-CM | POA: Diagnosis not present

## 2021-02-06 DIAGNOSIS — L97518 Non-pressure chronic ulcer of other part of right foot with other specified severity: Secondary | ICD-10-CM | POA: Diagnosis not present

## 2021-02-06 LAB — GLUCOSE, CAPILLARY
Glucose-Capillary: 165 mg/dL — ABNORMAL HIGH (ref 70–99)
Glucose-Capillary: 155 mg/dL — ABNORMAL HIGH (ref 70–99)

## 2021-02-06 NOTE — Progress Notes (Signed)
[name] [name], [name] [name] ([id number]) Visit Report for 02/06/2021 SuperBill Details Patient Name: Date of Service: [name] [name], [name] [name] 02/06/2021 Medical Record Number: [id number] Patient Account Number: [id number] Date of Birth/Sex: Treating RN: September 23, 1950 (70 y.o. [name], [name] Primary Care Provider: [name] Other Clinician: Donavan Burnet Referring Provider: Treating Provider/Extender: Oren Section in Treatment: 2 Diagnosis Coding ICD-10 Codes Code Description [id number] Type 2 diabetes mellitus with foot ulcer L97.518 Non-pressure chronic ulcer of other part of right foot with other specified severity L97.511 Non-pressure chronic ulcer of other part of right foot limited to breakdown of skin M86.371 Chronic multifocal osteomyelitis, right ankle and foot E11.621 Type 2 diabetes mellitus with foot ulcer Facility Procedures CPT4 Code Description Modifier Quantity [id number]-(Facility Use Only) HBOT full body chamber, 6mn , 4 ICD-10 Diagnosis Description E11.621 Type 2 diabetes mellitus with foot ulcer L97.518 Non-pressure chronic ulcer of other part of right foot with other specified severity L97.511 Non-pressure chronic ulcer of other part of right foot limited to breakdown of skin M86.371 Chronic multifocal osteomyelitis, right ankle and foot Physician Procedures Quantity CPT4 Code Description Modifier [id number] PHYS HYPERBARIC OXYGEN THERAPY 1 ICD-10 Diagnosis Description E11.621 Type 2 diabetes mellitus with foot ulcer L97.518 Non-pressure chronic ulcer of other part of right foot with other specified severity L97.511 Non-pressure chronic ulcer of other part of right foot limited to breakdown of skin M86.371 Chronic multifocal osteomyelitis, right ankle and foot Electronic Signature(s) Signed: 02/06/2021 4:08:59 PM By: SDonavan BurnetEMT Signed: 02/06/2021 5:24:48 PM By: HKalman ShanDO Entered By: SDonavan Burneton  02/06/2021 10:48:45

## 2021-02-06 NOTE — Progress Notes (Signed)
MAGDALENO, KISTER ([id number]) Visit Report for 02/06/2021 HBO Details Patient Name: Date of Service: [name] [name], [name] [name] 02/06/2021 8:00 A M Medical Record Number: [id number] Patient Account Number: [id number] Date of Birth/Sex: Treating RN: [birth year] (70 y.o. [name] [name], [name] Primary Care Cecile Gillispie: [name] Other Clinician: Donavan Burnet Referring Dashawna Delbridge: Treating Vicci Reder/Extender: Oren Section in Treatment: 2 HBO Treatment Course Details Treatment Course Number: 1 Ordering Barbra Miner: Bernerd Pho Treatments Ordered: otal 40 HBO Treatment Start Date: 01/27/2021 HBO Indication: Diabetic Ulcer(s) of the Lower Extremity HBO Treatment Details Treatment Number: 6 Patient Type: Outpatient Chamber Type: Monoplace Chamber Serial #: [id number] Treatment Protocol: 2.5 ATA with 90 minutes oxygen, with two 5 minute air breaks Treatment Details Compression Rate Down: 2.0 psi / minute De-Compression Rate Up: 2.0 psi / minute A breaks and breathing ir Compress Tx Pressure periods Decompress Decompress Begins Reached (leave unused spaces Begins Ends blank) Chamber Pressure (ATA 1 2.5 2.5 2.5 2.5 2.5 - - 2.5 1 ) Clock Time (24 hr) 08:04 08:15 08:45 08:50 09:20 09:25 - - 09:55 10:07 Treatment Length: 123 (minutes) Treatment Segments: 4 Vital Signs Capillary Blood Glucose Reference Range: 80 - 120 mg / dl HBO Diabetic Blood Glucose Intervention Range: <131 mg/dl or >249 mg/dl Type: Time Vitals Blood Pulse: Respiratory Capillary Blood Glucose Pulse Action Temperature: Taken: Pressure: Rate: Glucose (mg/dl): Meter #: Oximetry (%) Taken: Pre 07:47 121/66 60 18 97.8 165 Post 10:09 129/53 50 18 97.5 155 Patient states he feels good Treatment Response Treatment Toleration: Well Treatment Completion Status: Treatment Completed without Adverse Event Additional Procedure Documentation Tissue Sevierity: Limited to breakdown of skin Maila Dukes  Notes Patient's diastolic BP was less than 60 mmHg and his heart rate was less than 60 bpm. Patient states he feels great and is asymptomatic for poor perfusion. Physician HBO Attestation: I certify that I supervised this HBO treatment in accordance with Medicare guidelines. A trained emergency response team is readily available per Yes hospital policies and procedures. Continue HBOT as ordered. Yes Electronic Signature(s) Signed: 02/06/2021 5:24:48 PM By: Kalman Shan DO Previous Signature: 02/06/2021 4:08:59 PM Version By: Donavan Burnet EMT Entered By: Kalman Shan on 02/06/2021 17:13:48 -------------------------------------------------------------------------------- HBO Safety Checklist Details Patient Name: Date of Service: [name] Hawthorne B. 02/06/2021 8:00 A M Medical Record Number: [id number] Patient Account Number: [id number] Date of Birth/Sex: Treating RN: 1951/01/31 (70 y.o. [name] [name], [name] Primary Care Tieasha Larsen: [name] Other Clinician: Donavan Burnet Referring Siria Calandro: Treating Jermone Geister/Extender: Oren Section in Treatment: 2 HBO Safety Checklist Items Safety Checklist Consent Form Signed Patient voided / foley secured and emptied When did you last eato 0715 Last dose of injectable or oral agent Yesterday post HBO Ostomy pouch emptied and vented if applicable NA All implantable devices assessed, documented and approved NA Intravenous access site secured and place band net Valuables secured Linens and cotton and cotton/polyester blend (less than 51% polyester) Personal oil-based products / skin lotions / body lotions removed Wigs or hairpieces removed NA Smoking or tobacco materials removed NA Books / newspapers / magazines / loose paper removed Cologne, aftershave, perfume and deodorant removed Jewelry removed (may wrap wedding band) Make-up removed NA Hair care products removed NA Battery operated devices  (external) removed Heating patches and chemical warmers removed Titanium eyewear removed Nail polish cured greater than 10 hours NA Casting material cured greater than 10 hours NA Hearing aids removed NA Loose dentures or partials removed NA Prosthetics have been removed NA Patient demonstrates correct use  of air break device (if applicable) Patient concerns have been addressed Patient grounding bracelet on and cord attached to chamber Specifics for Inpatients (complete in addition to above) Medication sheet sent with patient NA Intravenous medications needed or due during therapy sent with patient NA Drainage tubes (e.g. nasogastric tube or chest tube secured and vented) NA Endotracheal or Tracheotomy tube secured NA Cuff deflated of air and inflated with saline NA Airway suctioned NA Electronic Signature(s) Signed: 02/06/2021 4:08:59 PM By: Donavan Burnet EMT Entered By: Donavan Burnet on 02/06/2021 08:41:14

## 2021-02-06 NOTE — Progress Notes (Signed)
[name] [name] ([id number]) Visit Report for 02/06/2021 Arrival Information Details Patient Name: Date of Service: [name] [name] 02/06/2021 8:00 A M Medical Record Number: [id number] Patient Account Number: [id number] Date of Birth/Sex: Treating RN: May 04, 1951 (70 y.o. [name] [name], [name] [name] Primary Care [name] [name]: [name] [name] Other Clinician: Donavan [name] Referring [name] [name]: Treating [name] [name]/Extender: [name] [name] in Treatment: 2 Visit Information History Since Last Visit All ordered tests and consults were completed: Yes Patient Arrived: Ambulatory Added or deleted any medications: No Arrival Time: 07:40 Any new allergies or adverse reactions: No Accompanied By: self Had a fall or experienced change in No Transfer Assistance: None activities of daily living that may affect Patient Identification Verified: Yes risk of falls: Secondary Verification Process Completed: Yes Signs or symptoms of abuse/neglect since last visito No Patient Requires Transmission-Based Precautions: No Hospitalized since last visit: No Patient Has Alerts: Yes Implantable device outside of the clinic excluding No Patient Alerts: R ABI non compressible cellular tissue based products placed in the center L ABI non compressible since last visit: Pain Present Now: No Electronic Signature(s) Signed: 02/06/2021 4:08:59 PM By: [name] [name] EMT Entered By: [name] [name] on 02/06/2021 08:38:46 -------------------------------------------------------------------------------- Encounter Discharge Information Details Patient Name: Date of Service: [name] Hawthorne B. 02/06/2021 8:00 A M Medical Record Number: [id number] Patient Account Number: [id number] Date of Birth/Sex: Treating RN: Jan 05, 1951 (70 y.o. [name] [name] Primary Care [name] [name]: [name] [name] Other Clinician: Donavan [name] Referring [name] [name]: Treating [name] [name]/Extender: [name] [name] in Treatment: 2 Encounter Discharge Information Items Discharge Condition: Stable Ambulatory Status: Ambulatory Discharge Destination: Other (Note Required) Transportation: Private Auto Accompanied By: self Schedule Follow-up Appointment: No Clinical Summary of Care: Notes Patient goes to work after treatment. Electronic Signature(s) Signed: 02/06/2021 4:08:59 PM By: [name] [name] EMT Entered By: [name] [name] on 02/06/2021 10:49:26 -------------------------------------------------------------------------------- Vitals Details Patient Name: Date of Service: [name] Hawthorne B. 02/06/2021 8:00 A M Medical Record Number: [id number] Patient Account Number: [id number] Date of Birth/Sex: Treating RN: 1951-01-31 (70 y.o. [name] [name], [name] [name] Primary Care [name] [name]: [name] [name] Other Clinician: Donavan [name] Referring [name] [name]: Treating [name] [name]/Extender: [name] [name] in Treatment: 2 Vital Signs Time Taken: 07:47 Temperature (F): 97.8 Height (in): 73 Pulse (bpm): 60 Weight (lbs): 222 Respiratory Rate (breaths/min): 18 Body Mass Index (BMI): 29.3 Blood Pressure (mmHg): 121/66 Capillary Blood Glucose (mg/dl): 165 Reference Range: 80 - 120 mg / dl Electronic Signature(s) Signed: 02/06/2021 4:08:59 PM By: [name] [name] EMT Entered By: [name] [name] on 02/06/2021 08:39:25

## 2021-02-07 ENCOUNTER — Ambulatory Visit: Payer: Medicare HMO | Admitting: Internal Medicine

## 2021-02-07 ENCOUNTER — Encounter (HOSPITAL_BASED_OUTPATIENT_CLINIC_OR_DEPARTMENT_OTHER): Payer: Medicare HMO | Attending: Internal Medicine | Admitting: Internal Medicine

## 2021-02-07 ENCOUNTER — Encounter: Payer: Self-pay | Admitting: Internal Medicine

## 2021-02-07 ENCOUNTER — Telehealth: Payer: Self-pay

## 2021-02-07 ENCOUNTER — Other Ambulatory Visit: Payer: Self-pay

## 2021-02-07 VITALS — BP 144/76 | HR 61 | Temp 97.9°F | Wt 226.8 lb

## 2021-02-07 DIAGNOSIS — M86371 Chronic multifocal osteomyelitis, right ankle and foot: Secondary | ICD-10-CM | POA: Insufficient documentation

## 2021-02-07 DIAGNOSIS — E11621 Type 2 diabetes mellitus with foot ulcer: Secondary | ICD-10-CM

## 2021-02-07 DIAGNOSIS — M86171 Other acute osteomyelitis, right ankle and foot: Secondary | ICD-10-CM | POA: Diagnosis not present

## 2021-02-07 DIAGNOSIS — Z452 Encounter for adjustment and management of vascular access device: Secondary | ICD-10-CM | POA: Diagnosis not present

## 2021-02-07 DIAGNOSIS — L97518 Non-pressure chronic ulcer of other part of right foot with other specified severity: Secondary | ICD-10-CM

## 2021-02-07 DIAGNOSIS — L97511 Non-pressure chronic ulcer of other part of right foot limited to breakdown of skin: Secondary | ICD-10-CM

## 2021-02-07 DIAGNOSIS — L97519 Non-pressure chronic ulcer of other part of right foot with unspecified severity: Secondary | ICD-10-CM | POA: Diagnosis not present

## 2021-02-07 DIAGNOSIS — E1169 Type 2 diabetes mellitus with other specified complication: Secondary | ICD-10-CM | POA: Insufficient documentation

## 2021-02-07 LAB — GLUCOSE, CAPILLARY
Glucose-Capillary: 142 mg/dL — ABNORMAL HIGH (ref 70–99)
Glucose-Capillary: 109 mg/dL — ABNORMAL HIGH (ref 70–99)

## 2021-02-07 NOTE — Progress Notes (Signed)
CAPRICE, DRAKOS ([id number]) Visit Report for 02/07/2021 HBO Details Patient Name: Date of Service: [name] [name], [name] [name] 02/07/2021 9:30 A M Medical Record Number: [id number] Patient Account Number: [id number] Date of Birth/Sex: Treating RN: 01/30/1951 (70 y.o. [name] [name] Primary Care Bryar Dahms: [name] Other Clinician: Donavan Burnet Referring Haitham Dolinsky: Treating Donathan Buller/Extender: Oren Section in Treatment: 2 HBO Treatment Course Details Treatment Course Number: 1 Ordering Yassmine Tamm: Bernerd Pho Treatments Ordered: otal 40 HBO Treatment Start Date: 01/27/2021 HBO Indication: Diabetic Ulcer(s) of the Lower Extremity HBO Treatment Details Treatment Number: 7 Patient Type: Outpatient Chamber Type: Monoplace Chamber Serial #: [id number] Treatment Protocol: 2.5 ATA with 90 minutes oxygen, with two 5 minute air breaks Treatment Details Compression Rate Down: 2.0 psi / minute De-Compression Rate Up: 2.0 psi / minute A breaks and breathing ir Compress Tx Pressure periods Decompress Decompress Begins Reached (leave unused spaces Begins Ends blank) Chamber Pressure (ATA 1 2.5 2.5 2.5 2.5 2.5 - - 2.5 1 ) Clock Time (24 hr) 09:41 09:53 10:23 10:28 10:58 11:05 - - 11:35 11:48 Treatment Length: 127 (minutes) Treatment Segments: 4 Vital Signs Capillary Blood Glucose Reference Range: 80 - 120 mg / dl HBO Diabetic Blood Glucose Intervention Range: <131 mg/dl or >249 mg/dl Type: Time Vitals Blood Pulse: Respiratory Temperature: Capillary Blood Glucose Pulse Action Taken: Pressure: Rate: Glucose (mg/dl): Meter #: Oximetry (%) Taken: Pre 09:19 138/68 57 18 97.8 142 Post 12:13 132/63 49 18 97.7 109 Patient discharged per [facility] protocol. Treatment Response Treatment Toleration: Well Treatment Completion Status: Treatment Completed without Adverse Event Additional Procedure Documentation Tissue Sevierity: Limited to breakdown of skin Physician  HBO Attestation: I certify that I supervised this HBO treatment in accordance with Medicare guidelines. A trained emergency response team is readily available per Yes hospital policies and procedures. Continue HBOT as ordered. Yes Electronic Signature(s) Signed: 02/07/2021 2:15:47 PM By: Kalman Shan DO Previous Signature: 02/07/2021 1:28:31 PM Version By: Donavan Burnet EMT Entered By: Kalman Shan on 02/07/2021 14:14:51 -------------------------------------------------------------------------------- HBO Safety Checklist Details Patient Name: Date of Service: [name] [name] 02/07/2021 9:30 A M Medical Record Number: [id number] Patient Account Number: [id number] Date of Birth/Sex: Treating RN: 08-14-1950 (70 y.o. [name] [name] Primary Care Shamaine Mulkern: [name] Other Clinician: Donavan Burnet Referring Jovaun Levene: Treating Olivya Sobol/Extender: Oren Section in Treatment: 2 HBO Safety Checklist Items Safety Checklist Consent Form Signed Patient voided / foley secured and emptied When did you last eato 0830 Last dose of injectable or oral agent metformin after yesterday's HBO Tx Ostomy pouch emptied and vented if applicable NA All implantable devices assessed, documented and approved NA Intravenous access site secured and place Bandnet Valuables secured Linens and cotton and cotton/polyester blend (less than 51% polyester) Personal oil-based products / skin lotions / body lotions removed Wigs or hairpieces removed NA Smoking or tobacco materials removed NA Books / newspapers / magazines / loose paper removed Cologne, aftershave, perfume and deodorant removed Jewelry removed (may wrap wedding band) Make-up removed NA Hair care products removed Battery operated devices (external) removed Heating patches and chemical warmers removed Titanium eyewear removed Nail polish cured greater than 10 hours NA Casting material cured  greater than 10 hours NA Hearing aids removed NA Loose dentures or partials removed NA Prosthetics have been removed NA Patient demonstrates correct use of air break device (if applicable) Patient concerns have been addressed Patient grounding bracelet on and cord attached to chamber Specifics for Inpatients (complete in addition to above) Medication sheet  sent with patient NA Intravenous medications needed or due during therapy sent with patient NA Drainage tubes (e.g. nasogastric tube or chest tube secured and vented) NA Endotracheal or Tracheotomy tube secured NA Cuff deflated of air and inflated with saline NA Airway suctioned NA Electronic Signature(s) Signed: 02/07/2021 3:36:55 PM By: Donavan Burnet EMT Entered By: Donavan Burnet on 02/07/2021 10:23:35

## 2021-02-07 NOTE — Assessment & Plan Note (Signed)
Wound looks good, healing well, no drainage, dry.   Will have him complete the 6 weeks through August 2nd and he can stop after that  rtc prn

## 2021-02-07 NOTE — Assessment & Plan Note (Signed)
No issues with the picc line, working well Will have home health pull this after his last dose August 2nd.

## 2021-02-07 NOTE — Progress Notes (Signed)
[name] [name], [name] [name] ([id number]) Visit Report for 02/07/2021 Arrival Information Details Patient Name: Date of Service: [name] [name], [name] [name] 02/07/2021 9:30 A M Medical Record Number: [id number] Patient Account Number: [id number] Date of Birth/Sex: Treating RN: 04-Dec-1950 (70 y.o. [name] Primary Care Shanayah Kaffenberger: [name] Other Clinician: Donavan Burnet Referring Paulanthony Gleaves: Treating Conard Alvira/Extender: Oren Section in Treatment: 2 Visit Information History Since Last Visit All ordered tests and consults were completed: Yes Patient Arrived: Ambulatory Added or deleted any medications: No Arrival Time: 09:15 Any new allergies or adverse reactions: No Accompanied By: spouse Had a fall or experienced change in No Transfer Assistance: None activities of daily living that may affect Patient Identification Verified: Yes risk of falls: Secondary Verification Process Completed: Yes Signs or symptoms of abuse/neglect since last visito No Patient Requires Transmission-Based Precautions: No Hospitalized since last visit: No Patient Has Alerts: Yes Implantable device outside of the clinic excluding No Patient Alerts: R ABI non compressible cellular tissue based products placed in the center L ABI non compressible since last visit: Pain Present Now: No Electronic Signature(s) Signed: 02/07/2021 3:36:55 PM By: Donavan Burnet EMT Entered By: Donavan Burnet on 02/07/2021 10:21:29 -------------------------------------------------------------------------------- Encounter Discharge Information Details Patient Name: Date of Service: [name] Hawthorne B. 02/07/2021 9:30 A M Medical Record Number: [id number] Patient Account Number: [id number] Date of Birth/Sex: Treating RN: 31-Dec-1950 (70 y.o. [name] Primary Care Carsyn Boster: [name] Other Clinician: Donavan Burnet Referring Berea Majkowski: Treating Chelcie Estorga/Extender: Oren Section in Treatment: 2 Encounter Discharge Information Items Discharge Condition: Stable Ambulatory Status: Ambulatory Discharge Destination: Other (Note Required) Transportation: Private Auto Accompanied By: self Schedule Follow-up Appointment: No Clinical Summary of Care: Electronic Signature(s) Signed: 02/07/2021 1:29:53 PM By: Donavan Burnet EMT Entered By: Donavan Burnet on 02/07/2021 13:29:53 -------------------------------------------------------------------------------- Vitals Details Patient Name: Date of Service: [name] Hawthorne B. 02/07/2021 9:30 A M Medical Record Number: [id number] Patient Account Number: [id number] Date of Birth/Sex: Treating RN: January 02, 1951 (70 y.o. [name] Primary Care Seaton Hofmann: [name] Other Clinician: Donavan Burnet Referring Kamirah Shugrue: Treating Niani Mourer/Extender: Oren Section in Treatment: 2 Vital Signs Time Taken: 09:19 Temperature (F): 97.8 Height (in): 73 Pulse (bpm): 57 Weight (lbs): 222 Respiratory Rate (breaths/min): 18 Body Mass Index (BMI): 29.3 Blood Pressure (mmHg): 138/68 Capillary Blood Glucose (mg/dl): 142 Reference Range: 80 - 120 mg / dl Electronic Signature(s) Signed: 02/07/2021 3:36:55 PM By: Donavan Burnet EMT Entered By: Donavan Burnet on 02/07/2021 10:22:04

## 2021-02-07 NOTE — Assessment & Plan Note (Signed)
He will continue with wound care with Dr. Julius Bowels.  Getting hyperbaric oxygen therapy.

## 2021-02-07 NOTE — Telephone Encounter (Signed)
Spoke to [place] at [facility] to inform her of verbal orders per [place] after 02/11/2021. Mary repeated verbal orders back and verbalized her understanding.     [place], CMA

## 2021-02-07 NOTE — Progress Notes (Signed)
   Subjective:    Patient ID: [name] [name], male    DOB: Nov 27, 1950, 70 y.o.   MRN: [id number]  HPI Here for follow up of osteomyelitis. I saw him 7/13 for a right great toe wound on the medial plantar side with an MRI c/w osteomyelitis.  He was hospitalized, debrided and started on Invanz and cipro and when I saw him, I had him continue with IV Invanz and stop the cipro due to redundancy.  Cultures noted in [place].  Healing well, no drainage, no new concerns.    Review of Systems  Constitutional:  Negative for chills and fever.  Gastrointestinal:  Negative for diarrhea and nausea.  Skin:  Negative for rash.      Objective:   Physical Exam Eyes:     General: No scleral icterus. Pulmonary:     Effort: Pulmonary effort is normal.  Neurological:     General: No focal deficit present.     Mental Status: He is alert.  Psychiatric:        Mood and Affect: Mood normal.    SH: no tobacco      Assessment & Plan:

## 2021-02-07 NOTE — Progress Notes (Signed)
WYLER, SOUTHWOOD ([id number]) Visit Report for 02/07/2021 SuperBill Details Patient Name: Date of Service: [name] [name], [name] [name] 02/07/2021 Medical Record Number: [id number] Patient Account Number: [id number] Date of Birth/Sex: Treating RN: December 28, 1950 (70 y.o. [name] Primary Care Provider: [name] Other Clinician: Donavan Burnet Referring Provider: Treating Provider/Extender: Oren Section in Treatment: 2 Diagnosis Coding ICD-10 Codes Code Description [phone] Type 2 diabetes mellitus with foot ulcer L97.518 Non-pressure chronic ulcer of other part of right foot with other specified severity L97.511 Non-pressure chronic ulcer of other part of right foot limited to breakdown of skin M86.371 Chronic multifocal osteomyelitis, right ankle and foot E11.621 Type 2 diabetes mellitus with foot ulcer Facility Procedures CPT4 Code Description Modifier Quantity [id number] G0277-(Facility Use Only) HBOT full body chamber, 68mn , 4 ICD-10 Diagnosis Description E11.621 Type 2 diabetes mellitus with foot ulcer L97.518 Non-pressure chronic ulcer of other part of right foot with other specified severity L97.511 Non-pressure chronic ulcer of other part of right foot limited to breakdown of skin M86.371 Chronic multifocal osteomyelitis, right ankle and foot Physician Procedures Quantity CPT4 Code Description Modifier [id number] PHYS HYPERBARIC OXYGEN THERAPY 1 ICD-10 Diagnosis Description E11.621 Type 2 diabetes mellitus with foot ulcer L97.518 Non-pressure chronic ulcer of other part of right foot with other specified severity L97.511 Non-pressure chronic ulcer of other part of right foot limited to breakdown of skin M86.371 Chronic multifocal osteomyelitis, right ankle and foot Electronic Signature(s) Signed: 02/07/2021 1:28:57 PM By: SDonavan BurnetEMT Signed: 02/07/2021 2:15:47 PM By: HKalman ShanDO Entered By: SDonavan Burneton  02/07/2021 13:28:56

## 2021-02-10 ENCOUNTER — Encounter (HOSPITAL_BASED_OUTPATIENT_CLINIC_OR_DEPARTMENT_OTHER): Payer: Medicare HMO | Attending: Internal Medicine | Admitting: Internal Medicine

## 2021-02-10 ENCOUNTER — Other Ambulatory Visit: Payer: Self-pay

## 2021-02-10 DIAGNOSIS — Z8589 Personal history of malignant neoplasm of other organs and systems: Secondary | ICD-10-CM | POA: Diagnosis not present

## 2021-02-10 DIAGNOSIS — I252 Old myocardial infarction: Secondary | ICD-10-CM | POA: Diagnosis not present

## 2021-02-10 DIAGNOSIS — E11621 Type 2 diabetes mellitus with foot ulcer: Secondary | ICD-10-CM | POA: Insufficient documentation

## 2021-02-10 DIAGNOSIS — E1122 Type 2 diabetes mellitus with diabetic chronic kidney disease: Secondary | ICD-10-CM | POA: Insufficient documentation

## 2021-02-10 DIAGNOSIS — L97511 Non-pressure chronic ulcer of other part of right foot limited to breakdown of skin: Secondary | ICD-10-CM | POA: Insufficient documentation

## 2021-02-10 DIAGNOSIS — G4733 Obstructive sleep apnea (adult) (pediatric): Secondary | ICD-10-CM | POA: Diagnosis not present

## 2021-02-10 DIAGNOSIS — N1831 Chronic kidney disease, stage 3a: Secondary | ICD-10-CM | POA: Diagnosis not present

## 2021-02-10 DIAGNOSIS — E1151 Type 2 diabetes mellitus with diabetic peripheral angiopathy without gangrene: Secondary | ICD-10-CM | POA: Insufficient documentation

## 2021-02-10 DIAGNOSIS — Z7984 Long term (current) use of oral hypoglycemic drugs: Secondary | ICD-10-CM | POA: Insufficient documentation

## 2021-02-10 DIAGNOSIS — I2510 Atherosclerotic heart disease of native coronary artery without angina pectoris: Secondary | ICD-10-CM | POA: Diagnosis not present

## 2021-02-10 DIAGNOSIS — M86371 Chronic multifocal osteomyelitis, right ankle and foot: Secondary | ICD-10-CM | POA: Insufficient documentation

## 2021-02-10 DIAGNOSIS — I129 Hypertensive chronic kidney disease with stage 1 through stage 4 chronic kidney disease, or unspecified chronic kidney disease: Secondary | ICD-10-CM | POA: Diagnosis not present

## 2021-02-10 DIAGNOSIS — E1169 Type 2 diabetes mellitus with other specified complication: Secondary | ICD-10-CM | POA: Insufficient documentation

## 2021-02-10 DIAGNOSIS — E1140 Type 2 diabetes mellitus with diabetic neuropathy, unspecified: Secondary | ICD-10-CM | POA: Insufficient documentation

## 2021-02-10 DIAGNOSIS — M869 Osteomyelitis, unspecified: Secondary | ICD-10-CM | POA: Diagnosis not present

## 2021-02-10 DIAGNOSIS — L97518 Non-pressure chronic ulcer of other part of right foot with other specified severity: Secondary | ICD-10-CM | POA: Insufficient documentation

## 2021-02-10 LAB — GLUCOSE, CAPILLARY
Glucose-Capillary: 256 mg/dL — ABNORMAL HIGH (ref 70–99)
Glucose-Capillary: 179 mg/dL — ABNORMAL HIGH (ref 70–99)

## 2021-02-10 NOTE — Progress Notes (Signed)
[place], [place] ([id number]) Visit Report for 02/10/2021 SuperBill Details Patient Name: Date of Service: [name] [name], [name] [name] 02/10/2021 Medical Record Number: [id number] Patient Account Number: [id number] Date of Birth/Sex: Treating RN: 1950/12/28 (70 y.o. [name] Primary Care Provider: [name] Other Clinician: Donavan Burnet Referring Provider: Treating Provider/Extender: Oren Section in Treatment: 2 Diagnosis Coding ICD-10 Codes Code Description [phone] Type 2 diabetes mellitus with foot ulcer L97.518 Non-pressure chronic ulcer of other part of right foot with other specified severity L97.511 Non-pressure chronic ulcer of other part of right foot limited to breakdown of skin M86.371 Chronic multifocal osteomyelitis, right ankle and foot E11.621 Type 2 diabetes mellitus with foot ulcer Facility Procedures CPT4 Code Description Modifier Quantity [id number] G0277-(Facility Use Only) HBOT full body chamber, 87mn , 4 ICD-10 Diagnosis Description E11.621 Type 2 diabetes mellitus with foot ulcer L97.518 Non-pressure chronic ulcer of other part of right foot with other specified severity L97.511 Non-pressure chronic ulcer of other part of right foot limited to breakdown of skin M86.371 Chronic multifocal osteomyelitis, right ankle and foot Physician Procedures Quantity CPT4 Code Description Modifier [id number] PHYS HYPERBARIC OXYGEN THERAPY 1 ICD-10 Diagnosis Description E11.621 Type 2 diabetes mellitus with foot ulcer L97.518 Non-pressure chronic ulcer of other part of right foot with other specified severity L97.511 Non-pressure chronic ulcer of other part of right foot limited to breakdown of skin M86.371 Chronic multifocal osteomyelitis, right ankle and foot Electronic Signature(s) Signed: 02/10/2021 3:40:19 PM By: SDonavan BurnetEMT Signed: 02/10/2021 5:42:03 PM By: HKalman ShanDO Entered By: SDonavan Burneton  02/10/2021 10:37:17

## 2021-02-10 NOTE — Progress Notes (Signed)
[name] [name], [name] [name] ([id number]) Visit Report for 02/10/2021 HBO Details Patient Name: Date of Service: [name] [name] 02/10/2021 8:00 A M Medical Record Number: [id number] Patient Account Number: [id number] Date of Birth/Sex: Treating RN: [birth year] (70 y.o. [name] Primary Care [name] [name]: [name] Other Clinician: Donavan Burnet Referring [name] [name]: Treating [name] [name] Section in Treatment: 2 HBO Treatment Course Details Treatment Course Number: 1 Ordering Manjot Hinks: Bernerd Pho Treatments Ordered: otal 40 HBO Treatment Start Date: 01/27/2021 HBO Indication: Diabetic Ulcer(s) of the Lower Extremity HBO Treatment Details Treatment Number: 8 Patient Type: Outpatient Chamber Type: Monoplace Chamber Serial #: [id number] Treatment Protocol: 2.5 ATA with 90 minutes oxygen, with two 5 minute air breaks Treatment Details Compression Rate Down: 2.0 psi / minute De-Compression Rate Up: 2.0 psi / minute A breaks and breathing ir Compress Tx Pressure periods Decompress Decompress Begins Reached (leave unused spaces Begins Ends blank) Chamber Pressure (ATA 1 2.5 2.5 2.5 2.5 2.5 - - 2.5 1 ) Clock Time (24 hr) 08:18 08:30 09:00 09:05 09:35 09:40 - - 10:10 10:22 Treatment Length: 124 (minutes) Treatment Segments: 4 Vital Signs Capillary Blood Glucose Reference Range: 80 - 120 mg / dl HBO Diabetic Blood Glucose Intervention Range: <131 mg/dl or >249 mg/dl Type: Time Vitals Blood Pulse: Respiratory Temperature: Capillary Blood Glucose Pulse Action Taken: Pressure: Rate: Glucose (mg/dl): Meter #: Oximetry (%) Taken: Pre 07:51 118/44 61 18 97.8 256 Physician informed of Blood Glucose and BP Pre 07:55 114/58 BP taken manually Post 10:24 133/60 55 18 98 179 Patient Discharged Treatment Response Treatment Toleration: Well Treatment Completion Status: Treatment Completed without Adverse Event Additional Procedure Documentation Tissue  Sevierity: Limited to breakdown of skin [name] [name] Notes Patient tolerated treatment well with no concerns. Physician HBO Attestation: I certify that I supervised this HBO treatment in accordance with Medicare guidelines. A trained emergency response team is readily available per Yes hospital policies and procedures. Continue HBOT as ordered. Yes Electronic Signature(s) Signed: 02/10/2021 5:42:03 PM By: Kalman Shan DO Previous Signature: 02/10/2021 3:38:56 PM Version By: Donavan Burnet EMT Previous Signature: 02/10/2021 3:38:24 PM Version By: Donavan Burnet EMT Entered By: Kalman Shan on 02/10/2021 17:41:25 -------------------------------------------------------------------------------- HBO Safety Checklist Details Patient Name: Date of Service: [name] Hawthorne B. 02/10/2021 8:00 A M Medical Record Number: [id number] Patient Account Number: [id number] Date of Birth/Sex: Treating RN: 30-Aug-1950 (70 y.o. [name] Primary Care [name] [name]: [name] Other Clinician: Donavan Burnet Referring Maie Kesinger: Treating Artasia Thang/Extender: [name] Section in Treatment: 2 HBO Safety Checklist Items Safety Checklist Consent Form Signed Patient voided / foley secured and emptied When did you last eato 0700 Last dose of injectable or oral agent Metformin yesterday Ostomy pouch emptied and vented if applicable NA All implantable devices assessed, documented and approved NA Intravenous access site secured and place Bandnet Valuables secured Linens and cotton and cotton/polyester blend (less than 51% polyester) Personal oil-based products / skin lotions / body lotions removed Wigs or hairpieces removed NA Smoking or tobacco materials removed NA Books / newspapers / magazines / loose paper removed Cologne, aftershave, perfume and deodorant removed Jewelry removed (may wrap wedding band) Make-up removed NA Hair care products removed Battery operated  devices (external) removed Heating patches and chemical warmers removed Titanium eyewear removed Nail polish cured greater than 10 hours NA Casting material cured greater than 10 hours NA Hearing aids removed NA Loose dentures or partials removed NA Prosthetics have been removed NA Patient demonstrates correct use of air break device (  if applicable) Patient concerns have been addressed Patient grounding bracelet on and cord attached to chamber Specifics for Inpatients (complete in addition to above) Medication sheet sent with patient NA Intravenous medications needed or due during therapy sent with patient NA Drainage tubes (e.g. nasogastric tube or chest tube secured and vented) NA Endotracheal or Tracheotomy tube secured NA Cuff deflated of air and inflated with saline NA Airway suctioned NA Electronic Signature(s) Signed: 02/10/2021 3:40:19 PM By: Donavan Burnet EMT Entered By: Donavan Burnet on 02/10/2021 09:25:05

## 2021-02-10 NOTE — Progress Notes (Signed)
RASHAUD, FREDERIQUE ([id number]) Visit Report for 02/10/2021 Arrival Information Details Patient Name: Date of Service: TYRENCE, FANTASIA 02/10/2021 8:00 A M Medical Record Number: [id number] Patient Account Number: [id number] Date of Birth/Sex: Treating RN: 04/24/1951 (70 y.o. [name] Primary Care Aspynn Clover: [name] Other Clinician: Donavan Burnet Referring Gregory Dowe: Treating Kristoff Coonradt/Extender: Oren Section in Treatment: 2 Visit Information History Since Last Visit All ordered tests and consults were completed: Yes Patient Arrived: Ambulatory Added or deleted any medications: No Arrival Time: 07:30 Any new allergies or adverse reactions: No Accompanied By: sefl Had a fall or experienced change in No Transfer Assistance: None activities of daily living that may affect Patient Identification Verified: Yes risk of falls: Secondary Verification Process Completed: Yes Signs or symptoms of abuse/neglect since last visito No Patient Requires Transmission-Based Precautions: No Hospitalized since last visit: No Patient Has Alerts: Yes Implantable device outside of the clinic excluding No Patient Alerts: R ABI non compressible cellular tissue based products placed in the center L ABI non compressible since last visit: Pain Present Now: No Electronic Signature(s) Signed: 02/10/2021 3:40:19 PM By: Donavan Burnet EMT Entered By: Donavan Burnet on 02/10/2021 10:35:33 -------------------------------------------------------------------------------- Encounter Discharge Information Details Patient Name: Date of Service: [name] 02/10/2021 8:00 A M Medical Record Number: [id number] Patient Account Number: [id number] Date of Birth/Sex: Treating RN: May 04, 1951 (70 y.o. [name] Primary Care Roxann Vierra: [name] Other Clinician: Donavan Burnet Referring Meagen Limones: Treating Atreus Hasz/Extender: Oren Section in Treatment: 2 Encounter Discharge Information Items Discharge Condition: Stable Ambulatory Status: Ambulatory Discharge Destination: Other (Note Required) Transportation: Private Auto Accompanied By: self Schedule Follow-up Appointment: No Clinical Summary of Care: Notes Patient goes to work after treatment. Electronic Signature(s) Signed: 02/10/2021 3:40:19 PM By: Donavan Burnet EMT Entered By: Donavan Burnet on 02/10/2021 10:38:04 -------------------------------------------------------------------------------- Vitals Details Patient Name: Date of Service: [name] 02/10/2021 8:00 A M Medical Record Number: [id number] Patient Account Number: [id number] Date of Birth/Sex: Treating RN: August 27, 1950 (70 y.o. [name] Primary Care Otto Felkins: [name] Other Clinician: Donavan Burnet Referring Leylanie Woodmansee: Treating Akhil Piscopo/Extender: Oren Section in Treatment: 2 Vital Signs Time Taken: 07:51 Temperature (F): 97.8 Height (in): 73 Pulse (bpm): 61 Weight (lbs): 222 Respiratory Rate (breaths/min): 18 Body Mass Index (BMI): 29.3 Blood Pressure (mmHg): 118/44 Capillary Blood Glucose (mg/dl): 256 Reference Range: 80 - 120 mg / dl Electronic Signature(s) Signed: 02/10/2021 3:40:19 PM By: Donavan Burnet EMT Entered By: Donavan Burnet on 02/10/2021 10:35:42

## 2021-02-11 ENCOUNTER — Encounter (HOSPITAL_BASED_OUTPATIENT_CLINIC_OR_DEPARTMENT_OTHER): Payer: Medicare HMO | Admitting: Internal Medicine

## 2021-02-11 DIAGNOSIS — M86371 Chronic multifocal osteomyelitis, right ankle and foot: Secondary | ICD-10-CM | POA: Diagnosis not present

## 2021-02-11 DIAGNOSIS — I252 Old myocardial infarction: Secondary | ICD-10-CM | POA: Diagnosis not present

## 2021-02-11 DIAGNOSIS — E11621 Type 2 diabetes mellitus with foot ulcer: Secondary | ICD-10-CM | POA: Diagnosis not present

## 2021-02-11 DIAGNOSIS — M869 Osteomyelitis, unspecified: Secondary | ICD-10-CM | POA: Diagnosis not present

## 2021-02-11 DIAGNOSIS — G4733 Obstructive sleep apnea (adult) (pediatric): Secondary | ICD-10-CM | POA: Diagnosis not present

## 2021-02-11 DIAGNOSIS — L97518 Non-pressure chronic ulcer of other part of right foot with other specified severity: Secondary | ICD-10-CM | POA: Diagnosis not present

## 2021-02-11 DIAGNOSIS — E1151 Type 2 diabetes mellitus with diabetic peripheral angiopathy without gangrene: Secondary | ICD-10-CM | POA: Diagnosis not present

## 2021-02-11 DIAGNOSIS — L97511 Non-pressure chronic ulcer of other part of right foot limited to breakdown of skin: Secondary | ICD-10-CM | POA: Diagnosis not present

## 2021-02-11 DIAGNOSIS — E1169 Type 2 diabetes mellitus with other specified complication: Secondary | ICD-10-CM | POA: Diagnosis not present

## 2021-02-11 DIAGNOSIS — I129 Hypertensive chronic kidney disease with stage 1 through stage 4 chronic kidney disease, or unspecified chronic kidney disease: Secondary | ICD-10-CM | POA: Diagnosis not present

## 2021-02-11 DIAGNOSIS — N1831 Chronic kidney disease, stage 3a: Secondary | ICD-10-CM | POA: Diagnosis not present

## 2021-02-11 DIAGNOSIS — I2510 Atherosclerotic heart disease of native coronary artery without angina pectoris: Secondary | ICD-10-CM | POA: Diagnosis not present

## 2021-02-11 DIAGNOSIS — E1140 Type 2 diabetes mellitus with diabetic neuropathy, unspecified: Secondary | ICD-10-CM | POA: Diagnosis not present

## 2021-02-11 DIAGNOSIS — Z8589 Personal history of malignant neoplasm of other organs and systems: Secondary | ICD-10-CM | POA: Diagnosis not present

## 2021-02-11 DIAGNOSIS — E1122 Type 2 diabetes mellitus with diabetic chronic kidney disease: Secondary | ICD-10-CM | POA: Diagnosis not present

## 2021-02-11 DIAGNOSIS — Z7984 Long term (current) use of oral hypoglycemic drugs: Secondary | ICD-10-CM | POA: Diagnosis not present

## 2021-02-11 LAB — GLUCOSE, CAPILLARY
Glucose-Capillary: 224 mg/dL — ABNORMAL HIGH (ref 70–99)
Glucose-Capillary: 188 mg/dL — ABNORMAL HIGH (ref 70–99)

## 2021-02-11 NOTE — Progress Notes (Signed)
[name] [name], [name] [name] ([id number]) Visit Report for 02/11/2021 SuperBill Details Patient Name: Date of Service: [name] [name], [name] [name] 02/11/2021 Medical Record Number: [id number] Patient Account Number: [id number] Date of Birth/Sex: Treating RN: October 30, 1950 (70 y.o. [name], [name] Primary Care Provider: [name] Other Clinician: Donavan Burnet Referring Provider: Treating Provider/Extender: Oren Section in Treatment: 2 Diagnosis Coding ICD-10 Codes Code Description [phone] Type 2 diabetes mellitus with foot ulcer L97.518 Non-pressure chronic ulcer of other part of right foot with other specified severity L97.511 Non-pressure chronic ulcer of other part of right foot limited to breakdown of skin M86.371 Chronic multifocal osteomyelitis, right ankle and foot E11.621 Type 2 diabetes mellitus with foot ulcer Facility Procedures CPT4 Code Description Modifier Quantity [id number] G0277-(Facility Use Only) HBOT full body chamber, 30mn , 4 ICD-10 Diagnosis Description E11.621 Type 2 diabetes mellitus with foot ulcer L97.518 Non-pressure chronic ulcer of other part of right foot with other specified severity L97.511 Non-pressure chronic ulcer of other part of right foot limited to breakdown of skin M86.371 Chronic multifocal osteomyelitis, right ankle and foot Physician Procedures Quantity CPT4 Code Description Modifier [id number] PHYS HYPERBARIC OXYGEN THERAPY 1 ICD-10 Diagnosis Description E11.621 Type 2 diabetes mellitus with foot ulcer L97.518 Non-pressure chronic ulcer of other part of right foot with other specified severity L97.511 Non-pressure chronic ulcer of other part of right foot limited to breakdown of skin M86.371 Chronic multifocal osteomyelitis, right ankle and foot Electronic Signature(s) Signed: 02/11/2021 11:32:54 AM By: SDonavan BurnetEMT Signed: 02/11/2021 5:41:50 PM By: HKalman ShanDO Entered By: SDonavan Burneton  02/11/2021 10:42:34

## 2021-02-11 NOTE — Progress Notes (Signed)
YADIER, MARLAND ([id number]) Visit Report for 02/11/2021 HBO Details Patient Name: Date of Service: BOSIE, SUMMIT 02/11/2021 8:00 A M Medical Record Number: [id number] Patient Account Number: [id number] Date of Birth/Sex: Treating RN: 1951-01-05 (70 y.o. [name], [name] Primary Care Dorismar Chay: [name] Other Clinician: Donavan Burnet Referring Chea Malan: Treating Xitlalic Maslin/Extender: Oren Section in Treatment: 2 HBO Treatment Course Details Treatment Course Number: 1 Ordering Breya Cass: Bernerd Pho Treatments Ordered: otal 40 HBO Treatment Start Date: 01/27/2021 HBO Indication: Diabetic Ulcer(s) of the Lower Extremity HBO Treatment Details Treatment Number: 9 Patient Type: Outpatient Chamber Type: Monoplace Chamber Serial #: [id number] Treatment Protocol: 2.5 ATA with 90 minutes oxygen, with two 5 minute air breaks Treatment Details Compression Rate Down: 2.0 psi / minute De-Compression Rate Up: 2.0 psi / minute A breaks and breathing ir Compress Tx Pressure periods Decompress Decompress Begins Reached (leave unused spaces Begins Ends blank) Chamber Pressure (ATA 1 2.5 2.5 2.5 2.5 2.5 - - 2.5 1 ) Clock Time (24 hr) 08:10 08:22 08:52 08:57 09:27 09:32 - - 10:02 10:14 Treatment Length: 124 (minutes) Treatment Segments: 4 Vital Signs Capillary Blood Glucose Reference Range: 80 - 120 mg / dl HBO Diabetic Blood Glucose Intervention Range: <131 mg/dl or >249 mg/dl Time Vitals Blood Respiratory Capillary Blood Glucose Pulse Action Type: Pulse: Temperature: Taken: Pressure: Rate: Glucose (mg/dl): Meter #: Oximetry (%) Taken: Pre 08:04 138/60 60 18 97.8 224 99 Post 10:17 127/58 56 18 98.2 188 99 Treatment Response Treatment Toleration: Well Treatment Completion Status: Treatment Completed without Adverse Event Additional Procedure Documentation Tissue Sevierity: Limited to breakdown of skin Taren Toops Notes Patient tolerated  treatment with no concerns. Physician HBO Attestation: I certify that I supervised this HBO treatment in accordance with Medicare guidelines. A trained emergency response team is readily available per Yes hospital policies and procedures. Continue HBOT as ordered. Yes Electronic Signature(s) Signed: 02/11/2021 5:41:50 PM By: Kalman Shan DO Previous Signature: 02/11/2021 11:32:54 AM Version By: Donavan Burnet EMT Entered By: Kalman Shan on 02/11/2021 17:40:06 -------------------------------------------------------------------------------- HBO Safety Checklist Details Patient Name: Date of Service: [name] 02/11/2021 8:00 A M Medical Record Number: [id number] Patient Account Number: [id number] Date of Birth/Sex: Treating RN: 25-Jun-1951 (70 y.o. [name], [name] Primary Care Jamari Moten: [name] Other Clinician: Donavan Burnet Referring Rasheen Bells: Treating Cherisa Brucker/Extender: Oren Section in Treatment: 2 HBO Safety Checklist Items Safety Checklist Consent Form Signed Patient voided / foley secured and emptied When did you last eato 0700 Last dose of injectable or oral agent metformin after yesterday's HBO Tx Ostomy pouch emptied and vented if applicable NA All implantable devices assessed, documented and approved NA Intravenous access site secured and place Bandnet Valuables secured Linens and cotton and cotton/polyester blend (less than 51% polyester) Personal oil-based products / skin lotions / body lotions removed Wigs or hairpieces removed NA Smoking or tobacco materials removed NA Books / newspapers / magazines / loose paper removed Cologne, aftershave, perfume and deodorant removed Jewelry removed (may wrap wedding band) Make-up removed NA Hair care products removed Battery operated devices (external) removed Heating patches and chemical warmers removed Titanium eyewear removed Nail polish cured greater than 10  hours NA Casting material cured greater than 10 hours NA Hearing aids removed NA Loose dentures or partials removed NA Prosthetics have been removed NA Patient demonstrates correct use of air break device (if applicable) Patient concerns have been addressed Patient grounding bracelet on and cord attached to chamber Specifics for Inpatients (complete in  addition to above) Medication sheet sent with patient NA Intravenous medications needed or due during therapy sent with patient NA Drainage tubes (e.g. nasogastric tube or chest tube secured and vented) NA Endotracheal or Tracheotomy tube secured NA Cuff deflated of air and inflated with saline NA Airway suctioned NA Electronic Signature(s) Signed: 02/11/2021 11:32:54 AM By: Donavan Burnet EMT Entered By: Donavan Burnet on 02/11/2021 10:39:56

## 2021-02-11 NOTE — Progress Notes (Signed)
KORVIN, WOODCOCK ([id number]) Visit Report for 02/11/2021 Arrival Information Details Patient Name: Date of Service: [name] [name], [name] [name] 02/11/2021 8:00 A M Medical Record Number: [id number] Patient Account Number: [id number] Date of Birth/Sex: Treating RN: 12-31-1950 (70 y.o. [name], [name] Primary Care Markavious Micco: [name] Other Clinician: Donavan Burnet Referring Lexianna Weinrich: Treating Hurley Sobel/Extender: Oren Section in Treatment: 2 Visit Information History Since Last Visit All ordered tests and consults were completed: Yes Patient Arrived: Ambulatory Added or deleted any medications: No Arrival Time: 07:30 Any new allergies or adverse reactions: No Accompanied By: self Had a fall or experienced change in No Transfer Assistance: None activities of daily living that may affect Patient Identification Verified: Yes risk of falls: Secondary Verification Process Completed: Yes Signs or symptoms of abuse/neglect since last visito No Patient Requires Transmission-Based Precautions: No Hospitalized since last visit: No Patient Has Alerts: Yes Implantable device outside of the clinic excluding No Patient Alerts: R ABI non compressible cellular tissue based products placed in the center L ABI non compressible since last visit: Pain Present Now: No Electronic Signature(s) Signed: 02/11/2021 11:32:54 AM By: Donavan Burnet EMT Entered By: Donavan Burnet on 02/11/2021 10:37:42 -------------------------------------------------------------------------------- Encounter Discharge Information Details Patient Name: Date of Service: [name] Hawthorne B. 02/11/2021 8:00 A M Medical Record Number: [id number] Patient Account Number: [id number] Date of Birth/Sex: Treating RN: Dec 10, 1950 (70 y.o. [name], [name] Primary Care Roselani Grajeda: [name] Other Clinician: Donavan Burnet Referring Hattie Aguinaldo: Treating Cherry Wittwer/Extender: Oren Section in Treatment: 2 Encounter Discharge Information Items Discharge Condition: Stable Ambulatory Status: Ambulatory Discharge Destination: Other (Note Required) Transportation: Private Auto Accompanied By: self Schedule Follow-up Appointment: No Clinical Summary of Care: Notes Patient goes to work after treatment. Electronic Signature(s) Signed: 02/11/2021 11:32:54 AM By: Donavan Burnet EMT Entered By: Donavan Burnet on 02/11/2021 10:43:34 -------------------------------------------------------------------------------- Vitals Details Patient Name: Date of Service: [name] Hawthorne B. 02/11/2021 8:00 A M Medical Record Number: [id number] Patient Account Number: [id number] Date of Birth/Sex: Treating RN: [birth year] (70 y.o. [name], [name] Primary Care Kayce Betty: [name] Other Clinician: Donavan Burnet Referring Kennadi Albany: Treating Keivon Garden/Extender: Oren Section in Treatment: 2 Vital Signs Time Taken: 08:04 Temperature (F): 97.8 Height (in): 73 Pulse (bpm): 60 Weight (lbs): 222 Respiratory Rate (breaths/min): 18 Body Mass Index (BMI): 29.3 Blood Pressure (mmHg): 138/60 Capillary Blood Glucose (mg/dl): 224 Reference Range: 80 - 120 mg / dl Airway Pulse Oximetry (%): 99 Electronic Signature(s) Signed: 02/11/2021 11:32:54 AM By: Donavan Burnet EMT Entered By: Donavan Burnet on 02/11/2021 11:29:06

## 2021-02-12 ENCOUNTER — Other Ambulatory Visit: Payer: Self-pay

## 2021-02-12 ENCOUNTER — Encounter (HOSPITAL_BASED_OUTPATIENT_CLINIC_OR_DEPARTMENT_OTHER): Payer: Medicare HMO | Admitting: Physician Assistant

## 2021-02-12 DIAGNOSIS — E1169 Type 2 diabetes mellitus with other specified complication: Secondary | ICD-10-CM | POA: Diagnosis not present

## 2021-02-12 DIAGNOSIS — Z8589 Personal history of malignant neoplasm of other organs and systems: Secondary | ICD-10-CM | POA: Diagnosis not present

## 2021-02-12 DIAGNOSIS — M86371 Chronic multifocal osteomyelitis, right ankle and foot: Secondary | ICD-10-CM | POA: Diagnosis not present

## 2021-02-12 DIAGNOSIS — G4733 Obstructive sleep apnea (adult) (pediatric): Secondary | ICD-10-CM | POA: Diagnosis not present

## 2021-02-12 DIAGNOSIS — N1831 Chronic kidney disease, stage 3a: Secondary | ICD-10-CM | POA: Diagnosis not present

## 2021-02-12 DIAGNOSIS — L97511 Non-pressure chronic ulcer of other part of right foot limited to breakdown of skin: Secondary | ICD-10-CM | POA: Diagnosis not present

## 2021-02-12 DIAGNOSIS — I252 Old myocardial infarction: Secondary | ICD-10-CM | POA: Diagnosis not present

## 2021-02-12 DIAGNOSIS — L97518 Non-pressure chronic ulcer of other part of right foot with other specified severity: Secondary | ICD-10-CM | POA: Diagnosis not present

## 2021-02-12 DIAGNOSIS — I129 Hypertensive chronic kidney disease with stage 1 through stage 4 chronic kidney disease, or unspecified chronic kidney disease: Secondary | ICD-10-CM | POA: Diagnosis not present

## 2021-02-12 DIAGNOSIS — Z7984 Long term (current) use of oral hypoglycemic drugs: Secondary | ICD-10-CM | POA: Diagnosis not present

## 2021-02-12 DIAGNOSIS — E1151 Type 2 diabetes mellitus with diabetic peripheral angiopathy without gangrene: Secondary | ICD-10-CM | POA: Diagnosis not present

## 2021-02-12 DIAGNOSIS — I2510 Atherosclerotic heart disease of native coronary artery without angina pectoris: Secondary | ICD-10-CM | POA: Diagnosis not present

## 2021-02-12 DIAGNOSIS — E11621 Type 2 diabetes mellitus with foot ulcer: Secondary | ICD-10-CM | POA: Diagnosis not present

## 2021-02-12 DIAGNOSIS — E1122 Type 2 diabetes mellitus with diabetic chronic kidney disease: Secondary | ICD-10-CM | POA: Diagnosis not present

## 2021-02-12 DIAGNOSIS — E1140 Type 2 diabetes mellitus with diabetic neuropathy, unspecified: Secondary | ICD-10-CM | POA: Diagnosis not present

## 2021-02-12 LAB — GLUCOSE, CAPILLARY
Glucose-Capillary: 163 mg/dL — ABNORMAL HIGH (ref 70–99)
Glucose-Capillary: 162 mg/dL — ABNORMAL HIGH (ref 70–99)

## 2021-02-12 NOTE — Progress Notes (Signed)
KRIST, BUCHOLZ ([id number]) Visit Report for 02/12/2021 Problem List Details Patient Name: Date of Service: [name] [name], [name] [name] 02/12/2021 8:00 A M Medical Record Number: [id number] Patient Account Number: [id number] Date of Birth/Sex: Treating RN: 18-Mar-1951 (70 y.o. [name] Primary Care Provider: [name] Other Clinician: Donavan Burnet Referring Provider: Treating Provider/Extender: Tally Joe in Treatment: 3 Active Problems ICD-10 Encounter Code Description Active Date MDM Diagnosis E11.621 Type 2 diabetes mellitus with foot ulcer 01/22/2021 No Yes L97.518 Non-pressure chronic ulcer of other part of right foot with other specified 01/22/2021 No Yes severity L97.511 Non-pressure chronic ulcer of other part of right foot limited to breakdown of 01/22/2021 No Yes skin M86.371 Chronic multifocal osteomyelitis, right ankle and foot 01/22/2021 No Yes E11.621 Type 2 diabetes mellitus with foot ulcer 01/22/2021 No Yes Inactive Problems Resolved Problems Electronic Signature(s) Signed: 02/12/2021 5:19:56 PM By: Worthy Keeler PA-C Entered By: Worthy Keeler on 02/12/2021 17:19:56 -------------------------------------------------------------------------------- SuperBill Details Patient Name: Date of Service: [name] 02/12/2021 Medical Record Number: [id number] Patient Account Number: [id number] Date of Birth/Sex: Treating RN: [birth year] (70 y.o. [name] Primary Care Provider: [name] Other Clinician: Donavan Burnet Referring Provider: Treating Provider/Extender: Tally Joe in Treatment: 3 Diagnosis Coding ICD-10 Codes Code Description [phone] Type 2 diabetes mellitus with foot ulcer L97.518 Non-pressure chronic ulcer of other part of right foot with other specified severity L97.511 Non-pressure chronic ulcer of other part of right foot limited to breakdown of skin M86.371 Chronic  multifocal osteomyelitis, right ankle and foot E11.621 Type 2 diabetes mellitus with foot ulcer Facility Procedures CPT4 Code: [id number] Description: G0277-(Facility Use Only) HBOT full body chamber, 60mn , ICD-10 Diagnosis Description E11.621 Type 2 diabetes mellitus with foot ulcer L97.518 Non-pressure chronic ulcer of other part of right foot with other specified severity L97.511  Non-pressure chronic ulcer of other part of right foot limited to breakdown of skin M86.371 Chronic multifocal osteomyelitis, right ankle and foot Modifier: Quantity: 4 Physician Procedures : CPT4 Code Description Modifier [id number] PHYS HYPERBARIC OXYGEN THERAPY ICD-10 Diagnosis Description E11.621 Type 2 diabetes mellitus with foot ulcer L97.518 Non-pressure chronic ulcer of other part of right foot with other specified severity  L97.511 Non-pressure chronic ulcer of other part of right foot limited to breakdown of skin M86.371 Chronic multifocal osteomyelitis, right ankle and foot Quantity: 1 Electronic Signature(s) Signed: 02/12/2021 5:19:52 PM By: SWorthy KeelerPA-C Previous Signature: 02/12/2021 10:49:35 AM Version By: SDonavan BurnetEMT Entered By: SWorthy Keeleron 02/12/2021 17:19:51

## 2021-02-12 NOTE — Progress Notes (Signed)
GERALDO, QUEIROZ ([id number]) Visit Report for 02/12/2021 HBO Details Patient Name: Date of Service: [name] [name], [name] [name] 02/12/2021 8:00 A M Medical Record Number: [id number] Patient Account Number: [id number] Date of Birth/Sex: Treating RN: 03-Jul-1951 (70 y.o. [name] Primary Care Grover Robinson: [name] Other Clinician: Donavan Burnet Referring Takeshia Wenk: Treating Gearldene Fiorenza/Extender: Tally Joe in Treatment: 3 HBO Treatment Course Details Treatment Course Number: 1 Ordering Minha Fulco: Linton Ham T Treatments Ordered: otal 40 HBO Treatment Start Date: 01/27/2021 HBO Indication: Diabetic Ulcer(s) of the Lower Extremity HBO Treatment Details Treatment Number: 10 Patient Type: Outpatient Chamber Type: Monoplace Chamber Serial #: [id number] Treatment Protocol: 2.5 ATA with 90 minutes oxygen, with two 5 minute air breaks Treatment Details Compression Rate Down: 2.0 psi / minute De-Compression Rate Up: 2.5 psi / minute A breaks and breathing ir Compress Tx Pressure periods Decompress Decompress Begins Reached (leave unused spaces Begins Ends blank) Chamber Pressure (ATA 1 2.5 2.5 2.5 2.5 2.5 - - 2.5 1 ) Clock Time (24 hr) 08:09 08:22 08:52 08:57 09:27 09:32 - - 10:02 10:11 Treatment Length: 122 (minutes) Treatment Segments: 4 Vital Signs Capillary Blood Glucose Reference Range: 80 - 120 mg / dl HBO Diabetic Blood Glucose Intervention Range: <131 mg/dl or >249 mg/dl Time Vitals Blood Respiratory Capillary Blood Glucose Pulse Action Type: Pulse: Temperature: Taken: Pressure: Rate: Glucose (mg/dl): Meter #: Oximetry (%) Taken: Pre 07:48 136/58 59 18 97.7 163 99 Post 10:17 133/53 53 18 97.5 162 99 Treatment Response Treatment Toleration: Well Treatment Completion Status: Treatment Completed without Adverse Event Additional Procedure Documentation Tissue Sevierity: Limited to breakdown of skin Sarahbeth Cashin Notes Patient tolerated treatment  well today with no issues or concerns. Physician HBO Attestation: I certify that I supervised this HBO treatment in accordance with Medicare guidelines. A trained emergency response team is readily available per Yes hospital policies and procedures. Continue HBOT as ordered. Yes Electronic Signature(s) Signed: 02/12/2021 5:19:43 PM By: Worthy Keeler PA-C Previous Signature: 02/12/2021 11:05:23 AM Version By: Donavan Burnet EMT Entered By: Worthy Keeler on 02/12/2021 17:19:43 -------------------------------------------------------------------------------- HBO Safety Checklist Details Patient Name: Date of Service: [name] Hawthorne B. 02/12/2021 8:00 A M Medical Record Number: [id number] Patient Account Number: [id number] Date of Birth/Sex: Treating RN: 03/02/1951 (70 y.o. [name] Primary Care Stacey Maura: [name] Other Clinician: Donavan Burnet Referring Elenna Spratling: Treating Akasia Ahmad/Extender: Tally Joe in Treatment: 3 HBO Safety Checklist Items Safety Checklist Consent Form Signed Patient voided / foley secured and emptied When did you last eato 0600 Last dose of injectable or oral agent Yesterday after HBO Tx Ostomy pouch emptied and vented if applicable NA All implantable devices assessed, documented and approved NA Intravenous access site secured and place NA PICC line removed yesterday Valuables secured Linens and cotton and cotton/polyester blend (less than 51% polyester) Personal oil-based products / skin lotions / body lotions removed Wigs or hairpieces removed NA Smoking or tobacco materials removed NA Books / newspapers / magazines / loose paper removed Cologne, aftershave, perfume and deodorant removed Jewelry removed (may wrap wedding band) Make-up removed NA Hair care products removed Battery operated devices (external) removed Heating patches and chemical warmers removed Titanium eyewear removed Nail polish  cured greater than 10 hours NA Casting material cured greater than 10 hours NA Hearing aids removed NA Loose dentures or partials removed NA Prosthetics have been removed NA Patient demonstrates correct use of air break device (if applicable) Patient concerns have been addressed Patient grounding bracelet  on and cord attached to chamber Specifics for Inpatients (complete in addition to above) Medication sheet sent with patient NA Intravenous medications needed or due during therapy sent with patient NA Drainage tubes (e.g. nasogastric tube or chest tube secured and vented) NA Endotracheal or Tracheotomy tube secured NA Cuff deflated of air and inflated with saline NA Airway suctioned NA Electronic Signature(s) Signed: 02/12/2021 11:06:40 AM By: Donavan Burnet EMT Entered By: Donavan Burnet on 02/12/2021 09:35:56

## 2021-02-12 NOTE — Progress Notes (Signed)
[name] [name], [name] [name] ([id number]) Visit Report for 02/12/2021 Arrival Information Details Patient Name: Date of Service: [name] [name] 02/12/2021 8:00 A M Medical Record Number: [id number] Patient Account Number: [id number] Date of Birth/Sex: Treating RN: 07-18-1950 (70 y.o. [name] [name] Primary Care [name] [name]: [name] [name] Other Clinician: Donavan [name] Referring [name] [name]: Treating [name] [name]/Extender: [name] [name] in Treatment: 3 Visit Information History Since Last Visit All ordered tests and consults were completed: Yes Patient Arrived: Ambulatory Added or deleted any medications: No Arrival Time: 07:30 Any new allergies or adverse reactions: No Accompanied By: self Had a fall or experienced change in No Transfer Assistance: None activities of daily living that may affect Patient Identification Verified: Yes risk of falls: Secondary Verification Process Completed: Yes Signs or symptoms of abuse/neglect since last visito No Patient Requires Transmission-Based Precautions: No Hospitalized since last visit: No Patient Has Alerts: Yes Implantable device outside of the clinic excluding No Patient Alerts: R ABI non compressible cellular tissue based products placed in the center L ABI non compressible since last visit: Pain Present Now: No Electronic Signature(s) Signed: 02/12/2021 11:06:40 AM By: [name] [name] EMT Entered By: [name] [name] on 02/12/2021 09:21:33 -------------------------------------------------------------------------------- Encounter Discharge Information Details Patient Name: Date of Service: [name] Hawthorne B. 02/12/2021 8:00 A M Medical Record Number: [id number] Patient Account Number: [id number] Date of Birth/Sex: Treating RN: 11-17-1950 (70 y.o. [name] [name] Primary Care [name] [name]: [name] [name] Other Clinician: Donavan [name] Referring [name] [name]: Treating [name] [name]/Extender: [name] [name] in Treatment: 3 Encounter Discharge Information Items Discharge Condition: Stable Ambulatory Status: Ambulatory Discharge Destination: Other (Note Required) Transportation: Private Auto Accompanied By: self Schedule Follow-up Appointment: No Clinical Summary of Care: Notes Patient goes to work after treatment. Electronic Signature(s) Signed: 02/12/2021 10:50:27 AM By: [name] [name] EMT Entered By: [name] [name] on 02/12/2021 10:50:26 -------------------------------------------------------------------------------- Vitals Details Patient Name: Date of Service: [name] Hawthorne B. 02/12/2021 8:00 A M Medical Record Number: [id number] Patient Account Number: [id number] Date of Birth/Sex: Treating RN: [birth year] (70 y.o. [name] [name] Primary Care [name] [name]: [name] [name] Other Clinician: Donavan [name] Referring [name] [name]: Treating [name] [name]/Extender: [name] [name] in Treatment: 3 Vital Signs Time Taken: 07:48 Temperature (F): 97.7 Height (in): 73 Pulse (bpm): 59 Weight (lbs): 222 Respiratory Rate (breaths/min): 18 Body Mass Index (BMI): 29.3 Blood Pressure (mmHg): 136/58 Capillary Blood Glucose (mg/dl): 163 Reference Range: 80 - 120 mg / dl Airway Pulse Oximetry (%): 99 Electronic Signature(s) Signed: 02/12/2021 11:06:40 AM By: [name] [name] EMT Entered By: [name] [name] on 02/12/2021 09:23:39

## 2021-02-13 ENCOUNTER — Encounter (HOSPITAL_BASED_OUTPATIENT_CLINIC_OR_DEPARTMENT_OTHER): Payer: Medicare HMO | Admitting: Internal Medicine

## 2021-02-13 DIAGNOSIS — Z8589 Personal history of malignant neoplasm of other organs and systems: Secondary | ICD-10-CM | POA: Diagnosis not present

## 2021-02-13 DIAGNOSIS — E1122 Type 2 diabetes mellitus with diabetic chronic kidney disease: Secondary | ICD-10-CM | POA: Diagnosis not present

## 2021-02-13 DIAGNOSIS — E1169 Type 2 diabetes mellitus with other specified complication: Secondary | ICD-10-CM | POA: Diagnosis not present

## 2021-02-13 DIAGNOSIS — L97518 Non-pressure chronic ulcer of other part of right foot with other specified severity: Secondary | ICD-10-CM | POA: Diagnosis not present

## 2021-02-13 DIAGNOSIS — M86371 Chronic multifocal osteomyelitis, right ankle and foot: Secondary | ICD-10-CM | POA: Diagnosis not present

## 2021-02-13 DIAGNOSIS — G4733 Obstructive sleep apnea (adult) (pediatric): Secondary | ICD-10-CM | POA: Diagnosis not present

## 2021-02-13 DIAGNOSIS — L97511 Non-pressure chronic ulcer of other part of right foot limited to breakdown of skin: Secondary | ICD-10-CM | POA: Diagnosis not present

## 2021-02-13 DIAGNOSIS — E1151 Type 2 diabetes mellitus with diabetic peripheral angiopathy without gangrene: Secondary | ICD-10-CM | POA: Diagnosis not present

## 2021-02-13 DIAGNOSIS — I2510 Atherosclerotic heart disease of native coronary artery without angina pectoris: Secondary | ICD-10-CM | POA: Diagnosis not present

## 2021-02-13 DIAGNOSIS — I252 Old myocardial infarction: Secondary | ICD-10-CM | POA: Diagnosis not present

## 2021-02-13 DIAGNOSIS — Z7984 Long term (current) use of oral hypoglycemic drugs: Secondary | ICD-10-CM | POA: Diagnosis not present

## 2021-02-13 DIAGNOSIS — I129 Hypertensive chronic kidney disease with stage 1 through stage 4 chronic kidney disease, or unspecified chronic kidney disease: Secondary | ICD-10-CM | POA: Diagnosis not present

## 2021-02-13 DIAGNOSIS — E11621 Type 2 diabetes mellitus with foot ulcer: Secondary | ICD-10-CM | POA: Diagnosis not present

## 2021-02-13 DIAGNOSIS — N1831 Chronic kidney disease, stage 3a: Secondary | ICD-10-CM | POA: Diagnosis not present

## 2021-02-13 DIAGNOSIS — E1140 Type 2 diabetes mellitus with diabetic neuropathy, unspecified: Secondary | ICD-10-CM | POA: Diagnosis not present

## 2021-02-13 LAB — GLUCOSE, CAPILLARY
Glucose-Capillary: 210 mg/dL — ABNORMAL HIGH (ref 70–99)
Glucose-Capillary: 158 mg/dL — ABNORMAL HIGH (ref 70–99)

## 2021-02-14 ENCOUNTER — Encounter (HOSPITAL_BASED_OUTPATIENT_CLINIC_OR_DEPARTMENT_OTHER): Payer: Medicare HMO | Admitting: Internal Medicine

## 2021-02-14 ENCOUNTER — Other Ambulatory Visit: Payer: Self-pay

## 2021-02-14 DIAGNOSIS — Z7984 Long term (current) use of oral hypoglycemic drugs: Secondary | ICD-10-CM | POA: Diagnosis not present

## 2021-02-14 DIAGNOSIS — I252 Old myocardial infarction: Secondary | ICD-10-CM | POA: Diagnosis not present

## 2021-02-14 DIAGNOSIS — L97511 Non-pressure chronic ulcer of other part of right foot limited to breakdown of skin: Secondary | ICD-10-CM | POA: Diagnosis not present

## 2021-02-14 DIAGNOSIS — E11621 Type 2 diabetes mellitus with foot ulcer: Secondary | ICD-10-CM | POA: Diagnosis not present

## 2021-02-14 DIAGNOSIS — Z8589 Personal history of malignant neoplasm of other organs and systems: Secondary | ICD-10-CM | POA: Diagnosis not present

## 2021-02-14 DIAGNOSIS — I129 Hypertensive chronic kidney disease with stage 1 through stage 4 chronic kidney disease, or unspecified chronic kidney disease: Secondary | ICD-10-CM | POA: Diagnosis not present

## 2021-02-14 DIAGNOSIS — E1140 Type 2 diabetes mellitus with diabetic neuropathy, unspecified: Secondary | ICD-10-CM | POA: Diagnosis not present

## 2021-02-14 DIAGNOSIS — E1151 Type 2 diabetes mellitus with diabetic peripheral angiopathy without gangrene: Secondary | ICD-10-CM | POA: Diagnosis not present

## 2021-02-14 DIAGNOSIS — I2510 Atherosclerotic heart disease of native coronary artery without angina pectoris: Secondary | ICD-10-CM | POA: Diagnosis not present

## 2021-02-14 DIAGNOSIS — L97518 Non-pressure chronic ulcer of other part of right foot with other specified severity: Secondary | ICD-10-CM | POA: Diagnosis not present

## 2021-02-14 DIAGNOSIS — G4733 Obstructive sleep apnea (adult) (pediatric): Secondary | ICD-10-CM | POA: Diagnosis not present

## 2021-02-14 DIAGNOSIS — E1169 Type 2 diabetes mellitus with other specified complication: Secondary | ICD-10-CM | POA: Diagnosis not present

## 2021-02-14 DIAGNOSIS — M86371 Chronic multifocal osteomyelitis, right ankle and foot: Secondary | ICD-10-CM | POA: Diagnosis not present

## 2021-02-14 DIAGNOSIS — N1831 Chronic kidney disease, stage 3a: Secondary | ICD-10-CM | POA: Diagnosis not present

## 2021-02-14 DIAGNOSIS — E1122 Type 2 diabetes mellitus with diabetic chronic kidney disease: Secondary | ICD-10-CM | POA: Diagnosis not present

## 2021-02-14 LAB — GLUCOSE, CAPILLARY
Glucose-Capillary: 192 mg/dL — ABNORMAL HIGH (ref 70–99)
Glucose-Capillary: 182 mg/dL — ABNORMAL HIGH (ref 70–99)

## 2021-02-15 NOTE — Progress Notes (Signed)
QUAID, GOCHNAUER ([id number]) Visit Report for 02/14/2021 SuperBill Details Patient Name: Date of Service: [name] [name], [name] [name] 02/14/2021 Medical Record Number: [id number] Patient Account Number: [id number] Date of Birth/Sex: Treating RN: 25-Feb-1951 (70 y.o. [name] Primary Care Provider: [name] Other Clinician: Donavan Burnet Referring Provider: Treating Provider/Extender: Oren Section in Treatment: 3 Diagnosis Coding ICD-10 Codes Code Description [phone] Type 2 diabetes mellitus with foot ulcer L97.518 Non-pressure chronic ulcer of other part of right foot with other specified severity L97.511 Non-pressure chronic ulcer of other part of right foot limited to breakdown of skin M86.371 Chronic multifocal osteomyelitis, right ankle and foot E11.621 Type 2 diabetes mellitus with foot ulcer Facility Procedures CPT4 Code Description Modifier Quantity [id number] G0277-(Facility Use Only) HBOT full body chamber, 68mn , 4 ICD-10 Diagnosis Description E11.621 Type 2 diabetes mellitus with foot ulcer L97.518 Non-pressure chronic ulcer of other part of right foot with other specified severity L97.511 Non-pressure chronic ulcer of other part of right foot limited to breakdown of skin M86.371 Chronic multifocal osteomyelitis, right ankle and foot Physician Procedures Quantity CPT4 Code Description Modifier [id number] PHYS HYPERBARIC OXYGEN THERAPY 1 ICD-10 Diagnosis Description E11.621 Type 2 diabetes mellitus with foot ulcer L97.518 Non-pressure chronic ulcer of other part of right foot with other specified severity L97.511 Non-pressure chronic ulcer of other part of right foot limited to breakdown of skin M86.371 Chronic multifocal osteomyelitis, right ankle and foot Electronic Signature(s) Signed: 02/14/2021 1:27:39 PM By: SDonavan BurnetEMT Signed: 02/14/2021 11:55:14 PM By: HKalman ShanDO Entered By: SDonavan Burneton  02/14/2021 13:27:38

## 2021-02-17 ENCOUNTER — Other Ambulatory Visit: Payer: Self-pay

## 2021-02-17 ENCOUNTER — Encounter (HOSPITAL_BASED_OUTPATIENT_CLINIC_OR_DEPARTMENT_OTHER): Payer: Medicare HMO | Admitting: Physician Assistant

## 2021-02-17 DIAGNOSIS — Z8589 Personal history of malignant neoplasm of other organs and systems: Secondary | ICD-10-CM | POA: Diagnosis not present

## 2021-02-17 DIAGNOSIS — G4733 Obstructive sleep apnea (adult) (pediatric): Secondary | ICD-10-CM | POA: Diagnosis not present

## 2021-02-17 DIAGNOSIS — E11621 Type 2 diabetes mellitus with foot ulcer: Secondary | ICD-10-CM | POA: Diagnosis not present

## 2021-02-17 DIAGNOSIS — N1831 Chronic kidney disease, stage 3a: Secondary | ICD-10-CM | POA: Diagnosis not present

## 2021-02-17 DIAGNOSIS — M86371 Chronic multifocal osteomyelitis, right ankle and foot: Secondary | ICD-10-CM | POA: Diagnosis not present

## 2021-02-17 DIAGNOSIS — E1169 Type 2 diabetes mellitus with other specified complication: Secondary | ICD-10-CM | POA: Diagnosis not present

## 2021-02-17 DIAGNOSIS — I252 Old myocardial infarction: Secondary | ICD-10-CM | POA: Diagnosis not present

## 2021-02-17 DIAGNOSIS — I2510 Atherosclerotic heart disease of native coronary artery without angina pectoris: Secondary | ICD-10-CM | POA: Diagnosis not present

## 2021-02-17 DIAGNOSIS — L97518 Non-pressure chronic ulcer of other part of right foot with other specified severity: Secondary | ICD-10-CM | POA: Diagnosis not present

## 2021-02-17 DIAGNOSIS — L97511 Non-pressure chronic ulcer of other part of right foot limited to breakdown of skin: Secondary | ICD-10-CM | POA: Diagnosis not present

## 2021-02-17 DIAGNOSIS — I129 Hypertensive chronic kidney disease with stage 1 through stage 4 chronic kidney disease, or unspecified chronic kidney disease: Secondary | ICD-10-CM | POA: Diagnosis not present

## 2021-02-17 DIAGNOSIS — E1151 Type 2 diabetes mellitus with diabetic peripheral angiopathy without gangrene: Secondary | ICD-10-CM | POA: Diagnosis not present

## 2021-02-17 DIAGNOSIS — E1140 Type 2 diabetes mellitus with diabetic neuropathy, unspecified: Secondary | ICD-10-CM | POA: Diagnosis not present

## 2021-02-17 DIAGNOSIS — Z7984 Long term (current) use of oral hypoglycemic drugs: Secondary | ICD-10-CM | POA: Diagnosis not present

## 2021-02-17 DIAGNOSIS — E1122 Type 2 diabetes mellitus with diabetic chronic kidney disease: Secondary | ICD-10-CM | POA: Diagnosis not present

## 2021-02-17 LAB — GLUCOSE, CAPILLARY
Glucose-Capillary: 227 mg/dL — ABNORMAL HIGH (ref 70–99)
Glucose-Capillary: 194 mg/dL — ABNORMAL HIGH (ref 70–99)

## 2021-02-18 ENCOUNTER — Encounter (HOSPITAL_BASED_OUTPATIENT_CLINIC_OR_DEPARTMENT_OTHER): Payer: Medicare HMO | Admitting: Physician Assistant

## 2021-02-18 DIAGNOSIS — I129 Hypertensive chronic kidney disease with stage 1 through stage 4 chronic kidney disease, or unspecified chronic kidney disease: Secondary | ICD-10-CM | POA: Diagnosis not present

## 2021-02-18 DIAGNOSIS — I252 Old myocardial infarction: Secondary | ICD-10-CM | POA: Diagnosis not present

## 2021-02-18 DIAGNOSIS — G4733 Obstructive sleep apnea (adult) (pediatric): Secondary | ICD-10-CM | POA: Diagnosis not present

## 2021-02-18 DIAGNOSIS — E11621 Type 2 diabetes mellitus with foot ulcer: Secondary | ICD-10-CM | POA: Diagnosis not present

## 2021-02-18 DIAGNOSIS — N1831 Chronic kidney disease, stage 3a: Secondary | ICD-10-CM | POA: Diagnosis not present

## 2021-02-18 DIAGNOSIS — I2510 Atherosclerotic heart disease of native coronary artery without angina pectoris: Secondary | ICD-10-CM | POA: Diagnosis not present

## 2021-02-18 DIAGNOSIS — E1140 Type 2 diabetes mellitus with diabetic neuropathy, unspecified: Secondary | ICD-10-CM | POA: Diagnosis not present

## 2021-02-18 DIAGNOSIS — M86371 Chronic multifocal osteomyelitis, right ankle and foot: Secondary | ICD-10-CM | POA: Diagnosis not present

## 2021-02-18 DIAGNOSIS — E1151 Type 2 diabetes mellitus with diabetic peripheral angiopathy without gangrene: Secondary | ICD-10-CM | POA: Diagnosis not present

## 2021-02-18 DIAGNOSIS — Z7984 Long term (current) use of oral hypoglycemic drugs: Secondary | ICD-10-CM | POA: Diagnosis not present

## 2021-02-18 DIAGNOSIS — L97511 Non-pressure chronic ulcer of other part of right foot limited to breakdown of skin: Secondary | ICD-10-CM | POA: Diagnosis not present

## 2021-02-18 DIAGNOSIS — E1122 Type 2 diabetes mellitus with diabetic chronic kidney disease: Secondary | ICD-10-CM | POA: Diagnosis not present

## 2021-02-18 DIAGNOSIS — L97518 Non-pressure chronic ulcer of other part of right foot with other specified severity: Secondary | ICD-10-CM | POA: Diagnosis not present

## 2021-02-18 DIAGNOSIS — Z8589 Personal history of malignant neoplasm of other organs and systems: Secondary | ICD-10-CM | POA: Diagnosis not present

## 2021-02-18 DIAGNOSIS — E1169 Type 2 diabetes mellitus with other specified complication: Secondary | ICD-10-CM | POA: Diagnosis not present

## 2021-02-18 LAB — GLUCOSE, CAPILLARY
Glucose-Capillary: 261 mg/dL — ABNORMAL HIGH (ref 70–99)
Glucose-Capillary: 222 mg/dL — ABNORMAL HIGH (ref 70–99)

## 2021-02-18 NOTE — Progress Notes (Signed)
[name] [name], [name] [name] ([id number]) Visit Report for 02/18/2021 Arrival Information Details Patient Name: Date of Service: [name] [name], [name] [name] 02/18/2021 8:00 A M Medical Record Number: [id number] Patient Account Number: [id number] Date of Birth/Sex: Treating RN: [birth year] (70 y.o. [name], [name] Primary Care Kaydra Borgen: [name] Other Clinician: Donavan Burnet Referring Keria Widrig: Treating Glinda Natzke/Extender: Tally Joe in Treatment: 3 Visit Information History Since Last Visit All ordered tests and consults were completed: Yes Patient Arrived: Ambulatory Added or deleted any medications: No Arrival Time: 07:30 Any new allergies or adverse reactions: No Accompanied By: self Had a fall or experienced change in No Transfer Assistance: None activities of daily living that may affect Patient Identification Verified: Yes risk of falls: Secondary Verification Process Completed: Yes Signs or symptoms of abuse/neglect since last visito No Patient Requires Transmission-Based Precautions: No Hospitalized since last visit: No Patient Has Alerts: Yes Implantable device outside of the clinic excluding No Patient Alerts: R ABI non compressible cellular tissue based products placed in the center L ABI non compressible since last visit: Pain Present Now: No Electronic Signature(s) Signed: 02/18/2021 11:04:06 AM By: Donavan Burnet EMT Entered By: Donavan Burnet on 02/18/2021 08:37:28 -------------------------------------------------------------------------------- Vitals Details Patient Name: Date of Service: [name] Hawthorne B. 02/18/2021 8:00 A M Medical Record Number: [id number] Patient Account Number: [id number] Date of Birth/Sex: Treating RN: 02-09-1951 (70 y.o. [name], [name] Primary Care Marcelis Wissner: [name] Other Clinician: Donavan Burnet Referring Rifky Lapre: Treating Merridy Pascoe/Extender: Tally Joe in Treatment:  3 Vital Signs Time Taken: 07:45 Temperature (F): 98.3 Height (in): 73 Pulse (bpm): 58 Weight (lbs): 222 Respiratory Rate (breaths/min): 18 Body Mass Index (BMI): 29.3 Blood Pressure (mmHg): 137/63 Capillary Blood Glucose (mg/dl): 261 Reference Range: 80 - 120 mg / dl Airway Pulse Oximetry (%): 98 Electronic Signature(s) Signed: 02/18/2021 11:04:06 AM By: Donavan Burnet EMT Entered By: Donavan Burnet on 02/18/2021 08:39:03

## 2021-02-18 NOTE — Progress Notes (Signed)
BIRDIE, GROTHAUS ([id number]) Visit Report for 02/13/2021 HBO Details Patient Name: Date of Service: [name] [name], [name] [name] 02/13/2021 8:00 A M Medical Record Number: [id number] Patient Account Number: [id number] Date of Birth/Sex: Treating RN: 1950-12-28 (70 y.o. [name], [name] Primary Care Vinay Ertl: [name] Other Clinician: Donavan Burnet Referring Quamel Fitzmaurice: Treating Nykerria Macconnell/Extender: Oren Section in Treatment: 3 HBO Treatment Course Details Treatment Course Number: 1 Ordering Dartagnan Beavers: Bernerd Pho Treatments Ordered: otal 40 HBO Treatment Start Date: 01/27/2021 HBO Indication: Diabetic Ulcer(s) of the Lower Extremity HBO Treatment Details Treatment Number: 11 Patient Type: Outpatient Chamber Type: Monoplace Chamber Serial #: [id number] Treatment Protocol: 2.5 ATA with 90 minutes oxygen, with two 5 minute air breaks Treatment Details Compression Rate Down: 2.0 psi / minute De-Compression Rate Up: 2.5 psi / minute A breaks and breathing ir Compress Tx Pressure periods Decompress Decompress Begins Reached (leave unused spaces Begins Ends blank) Chamber Pressure (ATA 1 2.5 2.5 2.5 2.5 2.5 - - 2.5 1 ) Clock Time (24 hr) 08:19 08:30 09:00 09:05 09:35 09:40 - - 10:10 10:19 Treatment Length: 120 (minutes) Treatment Segments: 4 Vital Signs Capillary Blood Glucose Reference Range: 80 - 120 mg / dl HBO Diabetic Blood Glucose Intervention Range: <131 mg/dl or >249 mg/dl Time Vitals Blood Respiratory Capillary Blood Glucose Pulse Action Type: Pulse: Temperature: Taken: Pressure: Rate: Glucose (mg/dl): Meter #: Oximetry (%) Taken: Pre 08:05 120/63 60 20 97.9 210 99 Post 10:26 149/55 52 18 97.6 158 99 Treatment Response Treatment Toleration: Well Treatment Completion Status: Treatment Completed without Adverse Event Additional Procedure Documentation Tissue Sevierity: Limited to breakdown of skin Physician HBO Attestation: I certify that I  supervised this HBO treatment in accordance with Medicare guidelines. A trained emergency response team is readily available per Yes hospital policies and procedures. Continue HBOT as ordered. Yes Electronic Signature(s) Signed: 02/13/2021 5:31:46 PM By: Kalman Shan DO Entered By: Kalman Shan on 02/13/2021 17:26:42 -------------------------------------------------------------------------------- HBO Safety Checklist Details Patient Name: Date of Service: [name] Hawthorne B. 02/13/2021 8:00 A M Medical Record Number: [id number] Patient Account Number: [id number] Date of Birth/Sex: Treating RN: 07-11-1951 (70 y.o. [name], [name] Primary Care Lacy Taglieri: [name] Other Clinician: Donavan Burnet Referring Raney Antwine: Treating Jenel Gierke/Extender: Oren Section in Treatment: 3 HBO Safety Checklist Items Safety Checklist Consent Form Signed Patient voided / foley secured and emptied When did you last eato 0600 Last dose of injectable or oral agent metformin after yesterday's HBO Tx Ostomy pouch emptied and vented if applicable NA All implantable devices assessed, documented and approved NA Intravenous access site secured and place NA Valuables secured Linens and cotton and cotton/polyester blend (less than 51% polyester) Personal oil-based products / skin lotions / body lotions removed Wigs or hairpieces removed NA Smoking or tobacco materials removed NA Books / newspapers / magazines / loose paper removed Cologne, aftershave, perfume and deodorant removed Jewelry removed (may wrap wedding band) Make-up removed NA Hair care products removed Battery operated devices (external) removed Heating patches and chemical warmers removed Titanium eyewear removed Nail polish cured greater than 10 hours NA Casting material cured greater than 10 hours NA Hearing aids removed NA Loose dentures or partials removed NA Prosthetics have been  removed NA Patient demonstrates correct use of air break device (if applicable) Patient concerns have been addressed Patient grounding bracelet on and cord attached to chamber Specifics for Inpatients (complete in addition to above) Medication sheet sent with patient NA Intravenous medications needed or due during therapy sent with  patient NA Drainage tubes (e.g. nasogastric tube or chest tube secured and vented) NA Endotracheal or Tracheotomy tube secured NA Cuff deflated of air and inflated with saline NA Airway suctioned NA Electronic Signature(s) Signed: 02/18/2021 11:04:06 AM By: Donavan Burnet EMT Entered By: Donavan Burnet on 02/13/2021 09:40:52

## 2021-02-18 NOTE — Progress Notes (Signed)
RODNER, KUMMER ([id number]) Visit Report for 02/14/2021 Arrival Information Details Patient Name: Date of Service: [name] [name], [name] [name] 02/14/2021 8:00 A M Medical Record Number: [id number] Patient Account Number: [id number] Date of Birth/Sex: Treating RN: 1951-03-14 (70 y.o. [name] Primary Care Talajah Slimp: [name] Other Clinician: Donavan Burnet Referring [place] Brogden: Treating Breck Hollinger/Extender: Oren Section in Treatment: 3 Visit Information History Since Last Visit All ordered tests and consults were completed: Yes Patient Arrived: Ambulatory Added or deleted any medications: No Arrival Time: 07:40 Any new allergies or adverse reactions: No Accompanied By: self Had a fall or experienced change in No Transfer Assistance: None activities of daily living that may affect Patient Identification Verified: Yes risk of falls: Secondary Verification Process Completed: Yes Signs or symptoms of abuse/neglect since last visito No Patient Requires Transmission-Based Precautions: No Hospitalized since last visit: No Patient Has Alerts: Yes Implantable device outside of the clinic excluding No Patient Alerts: R ABI non compressible cellular tissue based products placed in the center L ABI non compressible since last visit: Pain Present Now: No Electronic Signature(s) Signed: 02/18/2021 11:04:06 AM By: Donavan Burnet EMT Entered By: Donavan Burnet on 02/14/2021 09:07:35 -------------------------------------------------------------------------------- Encounter Discharge Information Details Patient Name: Date of Service: [name] Hawthorne B. 02/14/2021 8:00 A M Medical Record Number: [id number] Patient Account Number: [id number] Date of Birth/Sex: Treating RN: [birth year] (70 y.o. [name] Primary Care Izaiha Lo: [name] Other Clinician: Donavan Burnet Referring Carrisa Keller: Treating Alayla Dethlefs/Extender: Oren Section in Treatment: 3 Encounter Discharge Information Items Discharge Condition: Stable Ambulatory Status: Ambulatory Discharge Destination: Other (Note Required) Transportation: Private Auto Accompanied By: self Schedule Follow-up Appointment: No Clinical Summary of Care: Notes Patient goes to work after treatment. Electronic Signature(s) Signed: 02/14/2021 1:28:26 PM By: Donavan Burnet EMT Entered By: Donavan Burnet on 02/14/2021 13:28:26 -------------------------------------------------------------------------------- Vitals Details Patient Name: Date of Service: [name] Hawthorne B. 02/14/2021 8:00 A M Medical Record Number: [id number] Patient Account Number: [id number] Date of Birth/Sex: Treating RN: July 25, 1950 (70 y.o. [name] Primary Care Emara Lichter: [name] Other Clinician: Donavan Burnet Referring Jonerik Sliker: Treating Ailed Defibaugh/Extender: Oren Section in Treatment: 3 Vital Signs Time Taken: 08:02 Temperature (F): 98.2 Height (in): 73 Pulse (bpm): 63 Weight (lbs): 222 Respiratory Rate (breaths/min): 18 Body Mass Index (BMI): 29.3 Blood Pressure (mmHg): 146/56 Capillary Blood Glucose (mg/dl): 192 Reference Range: 80 - 120 mg / dl Airway Pulse Oximetry (%): 99 Electronic Signature(s) Signed: 02/18/2021 11:04:06 AM By: Donavan Burnet EMT Entered By: Donavan Burnet on 02/14/2021 09:09:03

## 2021-02-18 NOTE — Progress Notes (Signed)
DESHAN, GAUDINO ([id number]) Visit Report for 02/17/2021 Problem List Details Patient Name: Date of Service: [name] [name], [name] [name] 02/17/2021 8:00 A M Medical Record Number: [id number] Patient Account Number: [id number] Date of Birth/Sex: Treating RN: December 12, 1950 (70 y.o. [name] Primary Care Provider: [name] Other Clinician: Donavan Burnet Referring Provider: Treating Provider/Extender: Tally Joe in Treatment: 3 Active Problems ICD-10 Encounter Code Description Active Date MDM Diagnosis E11.621 Type 2 diabetes mellitus with foot ulcer 01/22/2021 No Yes L97.518 Non-pressure chronic ulcer of other part of right foot with other specified 01/22/2021 No Yes severity L97.511 Non-pressure chronic ulcer of other part of right foot limited to breakdown of 01/22/2021 No Yes skin M86.371 Chronic multifocal osteomyelitis, right ankle and foot 01/22/2021 No Yes E11.621 Type 2 diabetes mellitus with foot ulcer 01/22/2021 No Yes Inactive Problems Resolved Problems Electronic Signature(s) Signed: 02/17/2021 5:57:49 PM By: Worthy Keeler PA-C Entered By: Worthy Keeler on 02/17/2021 17:57:49 -------------------------------------------------------------------------------- SuperBill Details Patient Name: Date of Service: [name] 02/17/2021 Medical Record Number: [id number] Patient Account Number: [id number] Date of Birth/Sex: Treating RN: Nov 23, 1950 (70 y.o. [name] Primary Care Provider: [name] Other Clinician: Donavan Burnet Referring Provider: Treating Provider/Extender: Tally Joe in Treatment: 3 Diagnosis Coding ICD-10 Codes Code Description [id number] Type 2 diabetes mellitus with foot ulcer L97.518 Non-pressure chronic ulcer of other part of right foot with other specified severity L97.511 Non-pressure chronic ulcer of other part of right foot limited to breakdown of skin M86.371 Chronic  multifocal osteomyelitis, right ankle and foot E11.621 Type 2 diabetes mellitus with foot ulcer Facility Procedures CPT4 Code: [id number] Description: G0277-(Facility Use Only) HBOT full body chamber, 97mn , ICD-10 Diagnosis Description E11.621 Type 2 diabetes mellitus with foot ulcer L97.518 Non-pressure chronic ulcer of other part of right foot with other specified severity L97.511  Non-pressure chronic ulcer of other part of right foot limited to breakdown of skin M86.371 Chronic multifocal osteomyelitis, right ankle and foot Modifier: Quantity: 4 Physician Procedures : CPT4 Code Description Modifier [id number] PHYS HYPERBARIC OXYGEN THERAPY ICD-10 Diagnosis Description E11.621 Type 2 diabetes mellitus with foot ulcer L97.518 Non-pressure chronic ulcer of other part of right foot with other specified severity  L97.511 Non-pressure chronic ulcer of other part of right foot limited to breakdown of skin M86.371 Chronic multifocal osteomyelitis, right ankle and foot Quantity: 1 Electronic Signature(s) Signed: 02/17/2021 5:57:46 PM By: SWorthy KeelerPA-C Entered By: SWorthy Keeleron 02/17/2021 17:57:45

## 2021-02-18 NOTE — Progress Notes (Signed)
[name], [name] ([id number]) Visit Report for 02/14/2021 HBO Details Patient Name: Date of Service: [name] [name], [name] [name] 02/14/2021 8:00 A M Medical Record Number: [id number] Patient Account Number: [id number] Date of Birth/Sex: Treating RN: February 22, 1951 (70 y.o. [name] Primary Care Adan Baehr: [name] Other Clinician: Donavan Burnet Referring Ellicia Alix: Treating Rinoa Garramone/Extender: Oren Section in Treatment: 3 HBO Treatment Course Details Treatment Course Number: 1 Ordering Melo Stauber: Bernerd Pho Treatments Ordered: otal 40 HBO Treatment Start Date: 01/27/2021 HBO Indication: Diabetic Ulcer(s) of the Lower Extremity HBO Treatment Details Treatment Number: 12 Patient Type: Outpatient Chamber Type: Monoplace Chamber Serial #: [id number] Treatment Protocol: 2.5 ATA with 90 minutes oxygen, with two 5 minute air breaks Treatment Details Compression Rate Down: 2.0 psi / minute De-Compression Rate Up: 2.5 psi / minute A breaks and breathing ir Compress Tx Pressure periods Decompress Decompress Begins Reached (leave unused spaces Begins Ends blank) Chamber Pressure (ATA 1 2.5 2.5 2.5 2.5 2.5 - - 2.5 1 ) Clock Time (24 hr) 08:11 08:24 08:54 08:59 09:29 09:34 - - 10:04 10:13 Treatment Length: 122 (minutes) Treatment Segments: 4 Vital Signs Capillary Blood Glucose Reference Range: 80 - 120 mg / dl HBO Diabetic Blood Glucose Intervention Range: <131 mg/dl or >249 mg/dl Time Vitals Blood Respiratory Capillary Blood Glucose Pulse Action Type: Pulse: Temperature: Taken: Pressure: Rate: Glucose (mg/dl): Meter #: Oximetry (%) Taken: Pre 08:02 146/56 63 18 98.2 192 99 Post 10:18 138/41 53 18 97.4 182 100 Treatment Response Treatment Toleration: Well Treatment Completion Status: Treatment Completed without Adverse Event Additional Procedure Documentation Tissue Sevierity: Limited to breakdown of skin Quamir Willemsen Notes Patient tolerated  treatment well today with no issues or concerns. Patient states that he takes his metformin prescription after hyperbaric treatments on treatment days. Physician HBO Attestation: I certify that I supervised this HBO treatment in accordance with Medicare guidelines. A trained emergency response team is readily available per Yes hospital policies and procedures. Continue HBOT as ordered. Yes Electronic Signature(s) Signed: 02/14/2021 11:53:54 PM By: Kalman Shan DO Previous Signature: 02/14/2021 1:35:35 PM Version By: Donavan Burnet EMT Previous Signature: 02/14/2021 1:29:31 PM Version By: Donavan Burnet EMT Previous Signature: 02/14/2021 1:27:19 PM Version By: Donavan Burnet EMT Entered By: Kalman Shan on 02/14/2021 23:53:54 -------------------------------------------------------------------------------- HBO Safety Checklist Details Patient Name: Date of Service: [name] Hawthorne B. 02/14/2021 8:00 A M Medical Record Number: [id number] Patient Account Number: [id number] Date of Birth/Sex: Treating RN: 03/10/1951 (70 y.o. [name] Primary Care Deshana Rominger: [name] Other Clinician: Donavan Burnet Referring Ronne Stefanski: Treating Jacari Iannello/Extender: Oren Section in Treatment: 3 HBO Safety Checklist Items Safety Checklist Consent Form Signed Patient voided / foley secured and emptied When did you last eato 0600 Last dose of injectable or oral agent Yesterday after HBO Tx Ostomy pouch emptied and vented if applicable NA All implantable devices assessed, documented and approved NA Intravenous access site secured and place NA Valuables secured Linens and cotton and cotton/polyester blend (less than 51% polyester) Personal oil-based products / skin lotions / body lotions removed Wigs or hairpieces removed NA Smoking or tobacco materials removed NA Books / newspapers / magazines / loose paper removed Cologne, aftershave, perfume and  deodorant removed Jewelry removed (may wrap wedding band) Make-up removed NA Hair care products removed Battery operated devices (external) removed Heating patches and chemical warmers removed Titanium eyewear removed Nail polish cured greater than 10 hours NA Casting material cured greater than 10 hours NA Hearing aids removed NA Loose dentures or  partials removed NA Prosthetics have been removed NA Patient demonstrates correct use of air break device (if applicable) Patient concerns have been addressed Patient grounding bracelet on and cord attached to chamber Specifics for Inpatients (complete in addition to above) Medication sheet sent with patient NA Intravenous medications needed or due during therapy sent with patient NA Drainage tubes (e.g. nasogastric tube or chest tube secured and vented) NA Endotracheal or Tracheotomy tube secured NA Cuff deflated of air and inflated with saline NA Airway suctioned NA Electronic Signature(s) Signed: 02/18/2021 11:04:06 AM By: Donavan Burnet EMT Entered By: Donavan Burnet on 02/14/2021 09:10:41

## 2021-02-18 NOTE — Progress Notes (Signed)
[name], [name] ([id number]) Visit Report for 02/17/2021 HBO Details Patient Name: Date of Service: [name] [name], [name] [name] 02/17/2021 8:00 A M Medical Record Number: [id number] Patient Account Number: [id number] Date of Birth/Sex: Treating RN: Jan 11, 1951 (70 y.o. [name] Primary Care Carrye Goller: [name] Other Clinician: Donavan Burnet Referring Quitman Norberto: Treating Hershel Corkery/Extender: Tally Joe in Treatment: 3 HBO Treatment Course Details Treatment Course Number: 1 Ordering Biff Rutigliano: Bernerd Pho Treatments Ordered: otal 40 HBO Treatment Start Date: 01/27/2021 HBO Indication: Diabetic Ulcer(s) of the Lower Extremity HBO Treatment Details Treatment Number: 13 Patient Type: Outpatient Chamber Type: Monoplace Chamber Serial #: [id number] Treatment Protocol: 2.5 ATA with 90 minutes oxygen, with two 5 minute air breaks Treatment Details Compression Rate Down: 2.0 psi / minute De-Compression Rate Up: 2.0 psi / minute A breaks and breathing ir Compress Tx Pressure periods Decompress Decompress Begins Reached (leave unused spaces Begins Ends blank) Chamber Pressure (ATA 1 2.5 2.5 2.5 2.5 2.5 - - 2.5 1 ) Clock Time (24 hr) 08:19 08:31 09:01 09:06 09:36 09:41 - - 10:11 10:23 Treatment Length: 124 (minutes) Treatment Segments: 4 Vital Signs Capillary Blood Glucose Reference Range: 80 - 120 mg / dl HBO Diabetic Blood Glucose Intervention Range: <131 mg/dl or >249 mg/dl Time Vitals Blood Respiratory Capillary Blood Glucose Pulse Action Type: Pulse: Temperature: Taken: Pressure: Rate: Glucose (mg/dl): Meter #: Oximetry (%) Taken: Pre 08:09 127/64 65 18 98.2 227 97 Post 10:30 130/59 18 18 97.8 194 98 Treatment Response Treatment Toleration: Well Treatment Completion Status: Treatment Completed without Adverse Event Additional Procedure Documentation Tissue Sevierity: Limited to breakdown of skin Demika Langenderfer Notes Patient tolerated treatment  well today with no issues or concerns. Physician HBO Attestation: I certify that I supervised this HBO treatment in accordance with Medicare guidelines. A trained emergency response team is readily available per Yes hospital policies and procedures. Continue HBOT as ordered. Yes Electronic Signature(s) Signed: 02/17/2021 5:57:42 PM By: Worthy Keeler PA-C Entered By: Worthy Keeler on 02/17/2021 17:57:42 -------------------------------------------------------------------------------- HBO Safety Checklist Details Patient Name: Date of Service: [name], [name] 02/17/2021 8:00 A M Medical Record Number: [id number] Patient Account Number: [id number] Date of Birth/Sex: Treating RN: 26-May-1951 (70 y.o. [name] Primary Care Micca Matura: [name] Other Clinician: Donavan Burnet Referring Luna Audia: Treating Trevaris Pennella/Extender: Tally Joe in Treatment: 3 HBO Safety Checklist Items Safety Checklist Consent Form Signed Patient voided / foley secured and emptied When did you last eato 0600 Last dose of injectable or oral agent Metformin yesterday Ostomy pouch emptied and vented if applicable NA All implantable devices assessed, documented and approved NA Intravenous access site secured and place NA Valuables secured Linens and cotton and cotton/polyester blend (less than 51% polyester) Personal oil-based products / skin lotions / body lotions removed Wigs or hairpieces removed NA Smoking or tobacco materials removed NA Books / newspapers / magazines / loose paper removed Cologne, aftershave, perfume and deodorant removed Jewelry removed (may wrap wedding band) Make-up removed NA Hair care products removed Battery operated devices (external) removed Heating patches and chemical warmers removed Titanium eyewear removed Nail polish cured greater than 10 hours NA Casting material cured greater than 10 hours NA Hearing aids  removed NA Loose dentures or partials removed NA Prosthetics have been removed NA Patient demonstrates correct use of air break device (if applicable) Patient concerns have been addressed Patient grounding bracelet on and cord attached to chamber Specifics for Inpatients (complete in addition to above) Medication sheet  sent with patient NA Intravenous medications needed or due during therapy sent with patient NA Drainage tubes (e.g. nasogastric tube or chest tube secured and vented) NA Endotracheal or Tracheotomy tube secured NA Cuff deflated of air and inflated with saline NA Airway suctioned NA Electronic Signature(s) Signed: 02/18/2021 11:04:06 AM By: Donavan Burnet EMT Entered By: Donavan Burnet on 02/17/2021 11:25:25

## 2021-02-18 NOTE — Progress Notes (Addendum)
SUJAL, ALDAMA ([id number]) Visit Report for 02/18/2021 HBO Details Patient Name: Date of Service: [name] [name], [name] [name] 02/18/2021 8:00 A M Medical Record Number: [id number] Patient Account Number: [id number] Date of Birth/Sex: Treating RN: 03-14-1951 (70 y.o. [name], [name] Primary Care Cambelle Suchecki: [name] Other Clinician: Donavan Burnet Referring Mekhia Brogan: Treating Griselle Rufer/Extender: Tally Joe in Treatment: 3 HBO Treatment Course Details Treatment Course Number: 1 Ordering Cevin Rubinstein: Bernerd Pho Treatments Ordered: otal 40 HBO Treatment Start Date: 01/27/2021 HBO Indication: Diabetic Ulcer(s) of the Lower Extremity HBO Treatment Details Treatment Number: 14 Patient Type: Outpatient Chamber Type: Monoplace Chamber Serial #: [id number] Treatment Protocol: 2.5 ATA with 90 minutes oxygen, with two 5 minute air breaks Treatment Details Compression Rate Down: 2.0 psi / minute De-Compression Rate Up: 2.5 psi / minute A breaks and breathing ir Compress Tx Pressure periods Decompress Decompress Begins Reached (leave unused spaces Begins Ends blank) Chamber Pressure (ATA 1 2.5 2.5 2.5 2.5 2.5 - - 2.5 1 ) Clock Time (24 hr) 08:08 08:20 08:50 08:55 09:25 09:30 - - 10:00 10:09 Treatment Length: 121 (minutes) Treatment Segments: 4 Vital Signs Capillary Blood Glucose Reference Range: 80 - 120 mg / dl HBO Diabetic Blood Glucose Intervention Range: <131 mg/dl or >249 mg/dl Time Vitals Blood Respiratory Capillary Blood Glucose Pulse Action Type: Pulse: Temperature: Taken: Pressure: Rate: Glucose (mg/dl): Meter #: Oximetry (%) Taken: Pre 07:45 137/63 58 18 98.3 261 98 Post 10:13 135/57 53 18 98.4 222 Treatment Response Treatment Toleration: Well Treatment Completion Status: Treatment Completed without Adverse Event Additional Procedure Documentation Tissue Sevierity: Limited to breakdown of skin Tyarra Nolton Notes Patient tolerated treatment  well today. Blood glucose was higher today initially. Patient has been taking metformin after HBO Treatment on days that he has treatment. Electronic Signature(s) Signed: 02/19/2021 2:51:29 PM By: Donavan Burnet EMT Signed: 02/20/2021 5:47:57 PM By: Worthy Keeler PA-C Entered By: Donavan Burnet on 02/18/2021 11:13:23 -------------------------------------------------------------------------------- HBO Safety Checklist Details Patient Name: Date of Service: [name] Hawthorne B. 02/18/2021 8:00 A M Medical Record Number: [id number] Patient Account Number: [id number] Date of Birth/Sex: Treating RN: 26-Aug-1950 (70 y.o. [name], [name] Primary Care Cartez Mogle: [name] Other Clinician: Donavan Burnet Referring Melannie Metzner: Treating Surya Schroeter/Extender: Tally Joe in Treatment: 3 HBO Safety Checklist Items Safety Checklist Consent Form Signed Patient voided / foley secured and emptied When did you last eato 0600 Last dose of injectable or oral agent Yesterday after HBO Tx Ostomy pouch emptied and vented if applicable NA All implantable devices assessed, documented and approved NA Intravenous access site secured and place NA Valuables secured Linens and cotton and cotton/polyester blend (less than 51% polyester) Personal oil-based products / skin lotions / body lotions removed Wigs or hairpieces removed NA Smoking or tobacco materials removed NA Books / newspapers / magazines / loose paper removed Cologne, aftershave, perfume and deodorant removed Jewelry removed (may wrap wedding band) Make-up removed NA Hair care products removed NA Battery operated devices (external) removed Heating patches and chemical warmers removed NA Titanium eyewear removed Nail polish cured greater than 10 hours NA Casting material cured greater than 10 hours NA Hearing aids removed NA Loose dentures or partials removed Prosthetics have been  removed NA Patient demonstrates correct use of air break device (if applicable) Patient concerns have been addressed Patient grounding bracelet on and cord attached to chamber Specifics for Inpatients (complete in addition to above) Medication sheet sent with patient NA Intravenous medications needed or due during therapy  sent with patient NA Drainage tubes (e.g. nasogastric tube or chest tube secured and vented) NA Endotracheal or Tracheotomy tube secured NA Cuff deflated of air and inflated with saline NA Airway suctioned NA Electronic Signature(s) Signed: 02/18/2021 11:04:06 AM By: Donavan Burnet EMT Entered By: Donavan Burnet on 02/18/2021 08:41:33

## 2021-02-18 NOTE — Progress Notes (Signed)
[name] [name], [name] [name] ([id number]) Visit Report for 02/17/2021 Arrival Information Details Patient Name: Date of Service: [name] [name], [name] [name] 02/17/2021 8:00 A M Medical Record Number: [id number] Patient Account Number: [id number] Date of Birth/Sex: Treating RN: 27-Jul-1950 (70 y.o. [name] Primary Care Juliany Daughety: [name] Other Clinician: Donavan Burnet Referring Jaana Brodt: Treating Garry Bochicchio/Extender: Tally Joe in Treatment: 3 Visit Information History Since Last Visit All ordered tests and consults were completed: Yes Patient Arrived: Ambulatory Added or deleted any medications: No Arrival Time: 07:50 Any new allergies or adverse reactions: No Accompanied By: self Had a fall or experienced change in No Transfer Assistance: None activities of daily living that may affect Patient Identification Verified: Yes risk of falls: Secondary Verification Process Completed: Yes Signs or symptoms of abuse/neglect since last visito No Patient Requires Transmission-Based Precautions: No Hospitalized since last visit: No Patient Has Alerts: Yes Implantable device outside of the clinic excluding No Patient Alerts: R ABI non compressible cellular tissue based products placed in the center L ABI non compressible since last visit: Pain Present Now: No Electronic Signature(s) Signed: 02/18/2021 11:04:06 AM By: Donavan Burnet EMT Entered By: Donavan Burnet on 02/17/2021 11:22:41 -------------------------------------------------------------------------------- Encounter Discharge Information Details Patient Name: Date of Service: [name] Hawthorne B. 02/17/2021 8:00 A M Medical Record Number: [id number] Patient Account Number: [id number] Date of Birth/Sex: Treating RN: December 22, 1950 (70 y.o. [name] Primary Care Danelia Snodgrass: [name] Other Clinician: Donavan Burnet Referring Austine Kelsay: Treating Ata Pecha/Extender: Tally Joe in Treatment: 3 Encounter Discharge Information Items Discharge Condition: Stable Ambulatory Status: Ambulatory Discharge Destination: Other (Note Required) Transportation: Private Auto Accompanied By: self Schedule Follow-up Appointment: No Clinical Summary of Care: Notes Patient goes to work after treatment. Electronic Signature(s) Signed: 02/18/2021 11:04:06 AM By: Donavan Burnet EMT Entered By: Donavan Burnet on 02/17/2021 11:29:14 -------------------------------------------------------------------------------- Vitals Details Patient Name: Date of Service: [name] Hawthorne B. 02/17/2021 8:00 A M Medical Record Number: [id number] Patient Account Number: [id number] Date of Birth/Sex: Treating RN: 09/18/1950 (70 y.o. [name] Primary Care Lelon Ikard: [name] Other Clinician: Donavan Burnet Referring Britanee Vanblarcom: Treating Laquinta Hazell/Extender: Tally Joe in Treatment: 3 Vital Signs Time Taken: 08:09 Temperature (F): 98.2 Height (in): 73 Pulse (bpm): 65 Weight (lbs): 222 Respiratory Rate (breaths/min): 18 Body Mass Index (BMI): 29.3 Blood Pressure (mmHg): 127/64 Capillary Blood Glucose (mg/dl): 227 Reference Range: 80 - 120 mg / dl Airway Pulse Oximetry (%): 97 Electronic Signature(s) Signed: 02/18/2021 11:04:06 AM By: Donavan Burnet EMT Entered By: Donavan Burnet on 02/17/2021 11:23:40

## 2021-02-18 NOTE — Progress Notes (Signed)
JOSEN, AMENTA ([id number]) Visit Report for 02/13/2021 SuperBill Details Patient Name: Date of Service: [name] [name], [name] [name] 02/13/2021 Medical Record Number: [id number] Patient Account Number: [id number] Date of Birth/Sex: Treating RN: 03/24/1951 (70 y.o. [name], [name] Primary Care Provider: [name] Other Clinician: Donavan Burnet Referring Provider: Treating Provider/Extender: Oren Section in Treatment: 3 Diagnosis Coding ICD-10 Codes Code Description [phone] Type 2 diabetes mellitus with foot ulcer L97.518 Non-pressure chronic ulcer of other part of right foot with other specified severity L97.511 Non-pressure chronic ulcer of other part of right foot limited to breakdown of skin M86.371 Chronic multifocal osteomyelitis, right ankle and foot E11.621 Type 2 diabetes mellitus with foot ulcer Facility Procedures CPT4 Code Description Modifier Quantity [id number]-(Facility Use Only) HBOT full body chamber, 44mn , 4 ICD-10 Diagnosis Description E11.621 Type 2 diabetes mellitus with foot ulcer L97.518 Non-pressure chronic ulcer of other part of right foot with other specified severity L97.511 Non-pressure chronic ulcer of other part of right foot limited to breakdown of skin M86.371 Chronic multifocal osteomyelitis, right ankle and foot Physician Procedures Quantity CPT4 Code Description Modifier [id number] PHYS HYPERBARIC OXYGEN THERAPY 1 ICD-10 Diagnosis Description E11.621 Type 2 diabetes mellitus with foot ulcer L97.518 Non-pressure chronic ulcer of other part of right foot with other specified severity L97.511 Non-pressure chronic ulcer of other part of right foot limited to breakdown of skin M86.371 Chronic multifocal osteomyelitis, right ankle and foot Electronic Signature(s) Signed: 02/13/2021 5:31:46 PM By: HKalman ShanDO Signed: 02/18/2021 11:04:06 AM By: SDonavan BurnetEMT Entered By: SDonavan Burneton  02/13/2021 11:00:05

## 2021-02-18 NOTE — Progress Notes (Signed)
JERMIE, SCHREIB ([id number]) Visit Report for 02/13/2021 Arrival Information Details Patient Name: Date of Service: HALSEY, CRUMEDY 02/13/2021 8:00 A M Medical Record Number: [id number] Patient Account Number: [id number] Date of Birth/Sex: Treating RN: [birth year] (70 y.o. [name], [name] Primary Care Tamarius Rosenfield: [name] Other Clinician: Donavan Burnet Referring Demetrious Rainford: Treating Lalla Laham/Extender: Oren Section in Treatment: 3 Visit Information History Since Last Visit All ordered tests and consults were completed: Yes Patient Arrived: Ambulatory Added or deleted any medications: No Arrival Time: 07:30 Any new allergies or adverse reactions: No Accompanied By: self Had a fall or experienced change in No Transfer Assistance: None activities of daily living that may affect Patient Identification Verified: Yes risk of falls: Secondary Verification Process Completed: Yes Signs or symptoms of abuse/neglect since last visito No Patient Requires Transmission-Based Precautions: No Hospitalized since last visit: No Patient Has Alerts: Yes Implantable device outside of the clinic excluding No Patient Alerts: R ABI non compressible cellular tissue based products placed in the center L ABI non compressible since last visit: Pain Present Now: No Electronic Signature(s) Signed: 02/18/2021 11:04:06 AM By: Donavan Burnet EMT Entered By: Donavan Burnet on 02/13/2021 09:34:57 -------------------------------------------------------------------------------- Encounter Discharge Information Details Patient Name: Date of Service: [name] 02/13/2021 8:00 A M Medical Record Number: [id number] Patient Account Number: [id number] Date of Birth/Sex: Treating RN: Feb 07, 1951 (70 y.o. Hessie Diener Primary Care Shadoe Bethel: [name] Other Clinician: Donavan Burnet Referring Brinae Woods: Treating Natalia Wittmeyer/Extender: Oren Section in Treatment: 3 Encounter Discharge Information Items Discharge Condition: Stable Ambulatory Status: Ambulatory Discharge Destination: Other (Note Required) Accompanied By: self Schedule Follow-up Appointment: No Clinical Summary of Care: Notes Patient goes to work after treatment Electronic Signature(s) Signed: 02/18/2021 11:04:06 AM By: Donavan Burnet EMT Entered By: Donavan Burnet on 02/13/2021 11:00:52 -------------------------------------------------------------------------------- [place] Details Patient Name: Date of Service: [name] 02/13/2021 8:00 A M Medical Record Number: [id number] Patient Account Number: [id number] Date of Birth/Sex: Treating RN: May 25, 1951 (70 y.o. [name], [name] Primary Care Carmencita Cusic: [name] Other Clinician: Donavan Burnet Referring Chue Berkovich: Treating Daliah Chaudoin/Extender: Oren Section in Treatment: 3 Vital Signs Time Taken: 08:05 Temperature (F): 97.9 Height (in): 73 Pulse (bpm): 60 Weight (lbs): 222 Respiratory Rate (breaths/min): 20 Body Mass Index (BMI): 29.3 Blood Pressure (mmHg): 120/63 Capillary Blood Glucose (mg/dl): 210 Reference Range: 80 - 120 mg / dl Electronic Signature(s) Signed: 02/18/2021 11:04:06 AM By: Donavan Burnet EMT Entered By: Donavan Burnet on 02/13/2021 09:36:09

## 2021-02-19 ENCOUNTER — Other Ambulatory Visit: Payer: Self-pay

## 2021-02-19 ENCOUNTER — Encounter (HOSPITAL_BASED_OUTPATIENT_CLINIC_OR_DEPARTMENT_OTHER): Payer: Medicare HMO | Admitting: Physician Assistant

## 2021-02-19 DIAGNOSIS — Z7984 Long term (current) use of oral hypoglycemic drugs: Secondary | ICD-10-CM | POA: Diagnosis not present

## 2021-02-19 DIAGNOSIS — Z8589 Personal history of malignant neoplasm of other organs and systems: Secondary | ICD-10-CM | POA: Diagnosis not present

## 2021-02-19 DIAGNOSIS — I129 Hypertensive chronic kidney disease with stage 1 through stage 4 chronic kidney disease, or unspecified chronic kidney disease: Secondary | ICD-10-CM | POA: Diagnosis not present

## 2021-02-19 DIAGNOSIS — E1140 Type 2 diabetes mellitus with diabetic neuropathy, unspecified: Secondary | ICD-10-CM | POA: Diagnosis not present

## 2021-02-19 DIAGNOSIS — L97518 Non-pressure chronic ulcer of other part of right foot with other specified severity: Secondary | ICD-10-CM | POA: Diagnosis not present

## 2021-02-19 DIAGNOSIS — I2510 Atherosclerotic heart disease of native coronary artery without angina pectoris: Secondary | ICD-10-CM | POA: Diagnosis not present

## 2021-02-19 DIAGNOSIS — E1122 Type 2 diabetes mellitus with diabetic chronic kidney disease: Secondary | ICD-10-CM | POA: Diagnosis not present

## 2021-02-19 DIAGNOSIS — E1169 Type 2 diabetes mellitus with other specified complication: Secondary | ICD-10-CM | POA: Diagnosis not present

## 2021-02-19 DIAGNOSIS — N1831 Chronic kidney disease, stage 3a: Secondary | ICD-10-CM | POA: Diagnosis not present

## 2021-02-19 DIAGNOSIS — I252 Old myocardial infarction: Secondary | ICD-10-CM | POA: Diagnosis not present

## 2021-02-19 DIAGNOSIS — L97511 Non-pressure chronic ulcer of other part of right foot limited to breakdown of skin: Secondary | ICD-10-CM | POA: Diagnosis not present

## 2021-02-19 DIAGNOSIS — E11621 Type 2 diabetes mellitus with foot ulcer: Secondary | ICD-10-CM | POA: Diagnosis not present

## 2021-02-19 DIAGNOSIS — E1151 Type 2 diabetes mellitus with diabetic peripheral angiopathy without gangrene: Secondary | ICD-10-CM | POA: Diagnosis not present

## 2021-02-19 DIAGNOSIS — M86371 Chronic multifocal osteomyelitis, right ankle and foot: Secondary | ICD-10-CM | POA: Diagnosis not present

## 2021-02-19 DIAGNOSIS — G4733 Obstructive sleep apnea (adult) (pediatric): Secondary | ICD-10-CM | POA: Diagnosis not present

## 2021-02-19 LAB — GLUCOSE, CAPILLARY
Glucose-Capillary: 262 mg/dL — ABNORMAL HIGH (ref 70–99)
Glucose-Capillary: 201 mg/dL — ABNORMAL HIGH (ref 70–99)

## 2021-02-19 NOTE — Progress Notes (Signed)
MARCIANO, MEGA ([id number]) Visit Report for 02/19/2021 Arrival Information Details Patient Name: Date of Service: CHAUN, ELLENBERG 02/19/2021 7:30 A M Medical Record Number: [id number] Patient Account Number: [id number] Date of Birth/Sex: Treating RN: Jul 22, 1950 (70 y.o. [name] Primary Care Rylinn Linzy: [name] Other Clinician: Donavan Burnet Referring Reid Regas: Treating Melody Cirrincione/Extender: Tally Joe in Treatment: 4 Visit Information History Since Last Visit All ordered tests and consults were completed: Yes Patient Arrived: Ambulatory Added or deleted any medications: No Arrival Time: 07:15 Any new allergies or adverse reactions: No Accompanied By: self Had a fall or experienced change in No Transfer Assistance: None activities of daily living that may affect Patient Identification Verified: Yes risk of falls: Secondary Verification Process Completed: Yes Signs or symptoms of abuse/neglect since last visito No Patient Requires Transmission-Based Precautions: No Hospitalized since last visit: No Patient Has Alerts: Yes Implantable device outside of the clinic excluding No Patient Alerts: R ABI non compressible cellular tissue based products placed in the center L ABI non compressible since last visit: Pain Present Now: No Electronic Signature(s) Signed: 02/19/2021 2:56:38 PM By: Donavan Burnet EMT Entered By: Donavan Burnet on 02/19/2021 10:02:35 -------------------------------------------------------------------------------- Encounter Discharge Information Details Patient Name: Date of Service: [name] 02/19/2021 7:30 A M Medical Record Number: [id number] Patient Account Number: [id number] Date of Birth/Sex: Treating RN: July 17, 1950 (70 y.o. [name] Primary Care Jaziel Bennett: [name] Other Clinician: Donavan Burnet Referring Norvell Ureste: Treating Telsa Dillavou/Extender: Tally Joe in Treatment: 4 Encounter Discharge Information Items Discharge Condition: Stable Ambulatory Status: Ambulatory Discharge Destination: Other (Note Required) Transportation: Private Auto Accompanied By: self Schedule Follow-up Appointment: No Clinical Summary of Care: Notes Patient goes to work after treatment Electronic Signature(s) Signed: 02/19/2021 2:56:38 PM By: Donavan Burnet EMT Entered By: Donavan Burnet on 02/19/2021 10:19:01 -------------------------------------------------------------------------------- [place] Details Patient Name: Date of Service: [name] 02/19/2021 7:30 A M Medical Record Number: [id number] Patient Account Number: [id number] Date of Birth/Sex: Treating RN: 07-Apr-1951 (70 y.o. [name] Primary Care Laketha Leopard: [name] Other Clinician: Donavan Burnet Referring Marialuisa Basara: Treating Nathaniel Yaden/Extender: Tally Joe in Treatment: 4 Vital Signs Time Taken: 07:30 Temperature (F): 97.8 Height (in): 73 Pulse (bpm): 60 Weight (lbs): 222 Respiratory Rate (breaths/min): 18 Body Mass Index (BMI): 29.3 Blood Pressure (mmHg): 115/61 Capillary Blood Glucose (mg/dl): 262 Reference Range: 80 - 120 mg / dl Airway Pulse Oximetry (%): 100 Electronic Signature(s) Signed: 02/19/2021 2:56:38 PM By: Donavan Burnet EMT Entered By: Donavan Burnet on 02/19/2021 10:14:15

## 2021-02-20 ENCOUNTER — Encounter (HOSPITAL_BASED_OUTPATIENT_CLINIC_OR_DEPARTMENT_OTHER): Payer: Medicare HMO | Admitting: Internal Medicine

## 2021-02-20 NOTE — Progress Notes (Signed)
KELSY, MURROW ([id number]) Visit Report for 02/19/2021 SuperBill Details Patient Name: Date of Service: [name] [name], [name] [name] 02/19/2021 Medical Record Number: [id number] Patient Account Number: [id number] Date of Birth/Sex: Treating RN: February 04, 1951 (70 y.o. [name] Primary Care Provider: [name] Other Clinician: Donavan Burnet Referring Provider: Treating Provider/Extender: Tally Joe in Treatment: 4 Diagnosis Coding ICD-10 Codes Code Description [phone] Type 2 diabetes mellitus with foot ulcer L97.518 Non-pressure chronic ulcer of other part of right foot with other specified severity L97.511 Non-pressure chronic ulcer of other part of right foot limited to breakdown of skin M86.371 Chronic multifocal osteomyelitis, right ankle and foot E11.621 Type 2 diabetes mellitus with foot ulcer Facility Procedures CPT4 Code Description Modifier Quantity [id number] G0277-(Facility Use Only) HBOT full body chamber, 64mn , 4 ICD-10 Diagnosis Description E11.621 Type 2 diabetes mellitus with foot ulcer L97.518 Non-pressure chronic ulcer of other part of right foot with other specified severity L97.511 Non-pressure chronic ulcer of other part of right foot limited to breakdown of skin M86.371 Chronic multifocal osteomyelitis, right ankle and foot Physician Procedures Quantity CPT4 Code Description Modifier [id number] PHYS HYPERBARIC OXYGEN THERAPY 1 ICD-10 Diagnosis Description E11.621 Type 2 diabetes mellitus with foot ulcer L97.518 Non-pressure chronic ulcer of other part of right foot with other specified severity L97.511 Non-pressure chronic ulcer of other part of right foot limited to breakdown of skin M86.371 Chronic multifocal osteomyelitis, right ankle and foot Electronic Signature(s) Signed: 02/19/2021 3:04:40 PM By: SDonavan BurnetEMT Signed: 02/20/2021 5:47:57 PM By: SWorthy KeelerPA-C Previous Signature: 02/19/2021 2:56:38  PM Version By: SDonavan BurnetEMT Entered By: SDonavan Burneton 02/19/2021 15:04:39

## 2021-02-20 NOTE — Progress Notes (Signed)
[name] [name], [name] [name] ([id number]) Visit Report for 02/18/2021 SuperBill Details Patient Name: Date of Service: [name] [name], [name] [name] 02/18/2021 Medical Record Number: [id number] Patient Account Number: [id number] Date of Birth/Sex: Treating RN: 1950/10/31 (70 y.o. [name], [name] Primary Care Provider: [name] Other Clinician: Donavan Burnet Referring Provider: Treating Provider/Extender: Tally Joe in Treatment: 3 Diagnosis Coding ICD-10 Codes Code Description [phone] Type 2 diabetes mellitus with foot ulcer L97.518 Non-pressure chronic ulcer of other part of right foot with other specified severity L97.511 Non-pressure chronic ulcer of other part of right foot limited to breakdown of skin M86.371 Chronic multifocal osteomyelitis, right ankle and foot E11.621 Type 2 diabetes mellitus with foot ulcer Facility Procedures CPT4 Code Description Modifier Quantity [id number] G0277-(Facility Use Only) HBOT full body chamber, 87mn , 4 ICD-10 Diagnosis Description E11.621 Type 2 diabetes mellitus with foot ulcer L97.518 Non-pressure chronic ulcer of other part of right foot with other specified severity L97.511 Non-pressure chronic ulcer of other part of right foot limited to breakdown of skin M86.371 Chronic multifocal osteomyelitis, right ankle and foot Physician Procedures Quantity CPT4 Code Description Modifier [id number] PHYS HYPERBARIC OXYGEN THERAPY 1 ICD-10 Diagnosis Description E11.621 Type 2 diabetes mellitus with foot ulcer L97.518 Non-pressure chronic ulcer of other part of right foot with other specified severity L97.511 Non-pressure chronic ulcer of other part of right foot limited to breakdown of skin M86.371 Chronic multifocal osteomyelitis, right ankle and foot Electronic Signature(s) Signed: 02/19/2021 2:51:29 PM By: SDonavan BurnetEMT Signed: 02/20/2021 5:47:57 PM By: SWorthy KeelerPA-C Entered By: SDonavan Burneton  02/18/2021 11:13:31

## 2021-02-20 NOTE — Progress Notes (Signed)
[name] [name] ([id number]) Visit Report for 02/19/2021 HBO Details Patient Name: Date of Service: [name] [name], [name] [name] 02/19/2021 7:30 A M Medical Record Number: [id number] Patient Account Number: [id number] Date of Birth/Sex: Treating RN: 1950-07-28 (70 y.o. [name] [name] Primary Care Amilah Greenspan: [name] Other Clinician: Donavan Burnet Referring Zury Fazzino: Treating Motty Borin/Extender: Tally Joe in Treatment: 4 HBO Treatment Course Details Treatment Course Number: 1 Ordering Jesica Goheen: Linton Ham T Treatments Ordered: otal 40 HBO Treatment Start Date: 01/27/2021 HBO Indication: Diabetic Ulcer(s) of the Lower Extremity HBO Treatment Details Treatment Number: 15 Patient Type: Outpatient Chamber Type: Monoplace Chamber Serial #: [id number] Treatment Protocol: 2.5 ATA with 90 minutes oxygen, with two 5 minute air breaks Treatment Details Compression Rate Down: 2.0 psi / minute De-Compression Rate Up: 2.5 psi / minute A breaks and breathing ir Compress Tx Pressure periods Decompress Decompress Begins Reached (leave unused spaces Begins Ends blank) Chamber Pressure (ATA 1 2.5 2.5 2.5 2.5 2.5 - - 2.5 1 ) Clock Time (24 hr) 07:37 07:49 08:19 08:24 08:54 08:59 - - 09:29 09:38 Treatment Length: 121 (minutes) Treatment Segments: 4 Vital Signs Capillary Blood Glucose Reference Range: 80 - 120 mg / dl HBO Diabetic Blood Glucose Intervention Range: <131 mg/dl or >249 mg/dl Type: Time Vitals Blood Respiratory Capillary Blood Glucose Pulse Action Pulse: Temperature: Taken: Pressure: Rate: Glucose (mg/dl): Meter #: Oximetry (%) Taken: Pre 07:30 115/61 60 18 97.8 262 100 Reported to [name] [name] Post 09:44 129/63 51 18 97.7 201 Treatment Response Treatment Toleration: Well Treatment Completion Status: Treatment Completed without Adverse Event Additional Procedure Documentation Tissue Sevierity: Limited to breakdown of skin Rabecka Brendel Notes Patient  tolerated treatment well today. Patient's blood glucose was above the recommended level. Patient states he ate a piece of cake for a birthday and does not take metformin until after HBO treatment. Glucose level reported to [name] [name] per protocol and patient was cleared for treatment. Electronic Signature(s) Signed: 02/19/2021 3:04:29 PM By: Donavan Burnet EMT Signed: 02/20/2021 5:47:57 PM By: Worthy Keeler PA-C Previous Signature: 02/19/2021 2:53:57 PM Version By: Donavan Burnet EMT Previous Signature: 02/19/2021 2:56:38 PM Version By: Donavan Burnet EMT Entered By: Donavan Burnet on 02/19/2021 15:04:28 -------------------------------------------------------------------------------- HBO Safety Checklist Details Patient Name: Date of Service: [name] [name] 02/19/2021 7:30 A M Medical Record Number: [id number] Patient Account Number: [id number] Date of Birth/Sex: Treating RN: 1950-09-30 (70 y.o. [name] [name] Primary Care Silvanna Ohmer: [name] Other Clinician: Donavan Burnet Referring [name] [name]: Treating [name] [name]/Extender: Tally Joe in Treatment: 4 HBO Safety Checklist Items Safety Checklist Consent Form Signed Patient voided / foley secured and emptied When did you last eato Breakfast Last dose of injectable or oral agent Yesterday after HBO Tx Ostomy pouch emptied and vented if applicable NA All implantable devices assessed, documented and approved NA Intravenous access site secured and place NA Valuables secured Linens and cotton and cotton/polyester blend (less than 51% polyester) Personal oil-based products / skin lotions / body lotions removed Wigs or hairpieces removed NA Smoking or tobacco materials removed NA Books / newspapers / magazines / loose paper removed Cologne, aftershave, perfume and deodorant removed Jewelry removed (may wrap wedding band) Make-up removed NA Hair care products removed NA Battery  operated devices (external) removed Heating patches and chemical warmers removed Titanium eyewear removed Nail polish cured greater than 10 hours NA Casting material cured greater than 10 hours NA Hearing aids removed NA Loose dentures or partials removed NA Prosthetics have been removed NA Patient  demonstrates correct use of air break device (if applicable) Patient concerns have been addressed Patient grounding bracelet on and cord attached to chamber Specifics for Inpatients (complete in addition to above) Medication sheet sent with patient NA Intravenous medications needed or due during therapy sent with patient NA Drainage tubes (e.g. nasogastric tube or chest tube secured and vented) NA Endotracheal or Tracheotomy tube secured NA Cuff deflated of air and inflated with saline NA Airway suctioned NA Electronic Signature(s) Signed: 02/19/2021 2:56:38 PM By: Donavan Burnet EMT Entered By: Donavan Burnet on 02/19/2021 10:15:44

## 2021-02-21 ENCOUNTER — Encounter (HOSPITAL_BASED_OUTPATIENT_CLINIC_OR_DEPARTMENT_OTHER): Payer: Medicare HMO | Admitting: Internal Medicine

## 2021-02-24 ENCOUNTER — Other Ambulatory Visit: Payer: Self-pay

## 2021-02-24 ENCOUNTER — Encounter (HOSPITAL_BASED_OUTPATIENT_CLINIC_OR_DEPARTMENT_OTHER): Payer: Medicare HMO | Admitting: Internal Medicine

## 2021-02-24 DIAGNOSIS — I129 Hypertensive chronic kidney disease with stage 1 through stage 4 chronic kidney disease, or unspecified chronic kidney disease: Secondary | ICD-10-CM | POA: Diagnosis not present

## 2021-02-24 DIAGNOSIS — E11621 Type 2 diabetes mellitus with foot ulcer: Secondary | ICD-10-CM | POA: Diagnosis not present

## 2021-02-24 DIAGNOSIS — L97511 Non-pressure chronic ulcer of other part of right foot limited to breakdown of skin: Secondary | ICD-10-CM

## 2021-02-24 DIAGNOSIS — N1831 Chronic kidney disease, stage 3a: Secondary | ICD-10-CM | POA: Diagnosis not present

## 2021-02-24 DIAGNOSIS — L97518 Non-pressure chronic ulcer of other part of right foot with other specified severity: Secondary | ICD-10-CM | POA: Diagnosis not present

## 2021-02-24 DIAGNOSIS — Z7984 Long term (current) use of oral hypoglycemic drugs: Secondary | ICD-10-CM | POA: Diagnosis not present

## 2021-02-24 DIAGNOSIS — I2510 Atherosclerotic heart disease of native coronary artery without angina pectoris: Secondary | ICD-10-CM | POA: Diagnosis not present

## 2021-02-24 DIAGNOSIS — E1140 Type 2 diabetes mellitus with diabetic neuropathy, unspecified: Secondary | ICD-10-CM | POA: Diagnosis not present

## 2021-02-24 DIAGNOSIS — I252 Old myocardial infarction: Secondary | ICD-10-CM | POA: Diagnosis not present

## 2021-02-24 DIAGNOSIS — Z8589 Personal history of malignant neoplasm of other organs and systems: Secondary | ICD-10-CM | POA: Diagnosis not present

## 2021-02-24 DIAGNOSIS — M86371 Chronic multifocal osteomyelitis, right ankle and foot: Secondary | ICD-10-CM | POA: Diagnosis not present

## 2021-02-24 DIAGNOSIS — E1169 Type 2 diabetes mellitus with other specified complication: Secondary | ICD-10-CM | POA: Diagnosis not present

## 2021-02-24 DIAGNOSIS — E1151 Type 2 diabetes mellitus with diabetic peripheral angiopathy without gangrene: Secondary | ICD-10-CM | POA: Diagnosis not present

## 2021-02-24 DIAGNOSIS — E1122 Type 2 diabetes mellitus with diabetic chronic kidney disease: Secondary | ICD-10-CM | POA: Diagnosis not present

## 2021-02-24 DIAGNOSIS — G4733 Obstructive sleep apnea (adult) (pediatric): Secondary | ICD-10-CM | POA: Diagnosis not present

## 2021-02-24 LAB — GLUCOSE, CAPILLARY
Glucose-Capillary: 240 mg/dL — ABNORMAL HIGH (ref 70–99)
Glucose-Capillary: 236 mg/dL — ABNORMAL HIGH (ref 70–99)

## 2021-02-24 NOTE — Progress Notes (Signed)
[name] [name] ([id number]) Visit Report for 02/24/2021 Arrival Information Details Patient Name: Date of Service: [name] [name], [name] [name] 02/24/2021 8:00 A M Medical Record Number: [id number] Patient Account Number: [id number] Date of Birth/Sex: Treating RN: [birth year] (70 y.o. [name] [name] Primary Care [name] [name]: [name] [name] Other Clinician: Donavan [name] Referring [name] [name]: Treating Donnovan Stamour/Extender: [name] [name]: 4 Visit Information History Since Last Visit All ordered tests and consults were completed: Yes Patient Arrived: Ambulatory Added or deleted any medications: No Arrival Time: 07:35 Any new allergies or adverse reactions: No Accompanied By: self Had a fall or experienced change in No Transfer Assistance: None activities of daily living that may affect Patient Identification Verified: Yes risk of falls: Secondary Verification Process Completed: Yes Signs or symptoms of abuse/neglect since last visito No Patient Requires Transmission-Based Precautions: No Hospitalized since last visit: No Patient Has Alerts: Yes Implantable device outside of the clinic excluding No Patient Alerts: R ABI non compressible cellular tissue based products placed in the center L ABI non compressible since last visit: Pain Present Now: No Electronic Signature(s) Signed: 02/24/2021 1:50:01 PM By: [name] [name] EMT Entered By: [name] [name] on 02/24/2021 11:11:45 -------------------------------------------------------------------------------- Encounter Discharge Information Details Patient Name: Date of Service: [name] Hawthorne B. 02/24/2021 8:00 A M Medical Record Number: [id number] Patient Account Number: [id number] Date of Birth/Sex: Treating RN: 11/16/1950 (70 y.o. [name] [name] Primary Care Skylyn Slezak: [name] [name] Other Clinician: Donavan [name] Referring [name] [name]: Treating [name] [name]/Extender: [name] [name]: 4 Encounter Discharge Information Items Discharge Condition: Stable Ambulatory Status: Ambulatory Discharge Destination: Other (Note Required) Transportation: Private Auto Accompanied By: self Schedule Follow-up Appointment: No Clinical Summary of Care: Notes Patient goes to work after [name]. Electronic Signature(s) Signed: 02/24/2021 1:50:01 PM By: [name] [name] EMT Entered By: [name] [name] on 02/24/2021 11:17:52 -------------------------------------------------------------------------------- Vitals Details Patient Name: Date of Service: [name] Hawthorne B. 02/24/2021 8:00 A M Medical Record Number: [id number] Patient Account Number: [id number] Date of Birth/Sex: Treating RN: [birth year] (70 y.o. [name] [name] Primary Care Zackarey Holleman: [name] [name] Other Clinician: Donavan [name] Referring [name] [name]: Treating [name] [name]/Extender: [name] [name]: 4 Vital Signs Time Taken: 07:42 Temperature (F): 98.4 Height (in): 73 Pulse (bpm): 58 Weight (lbs): 222 Respiratory Rate (breaths/min): 18 Body Mass Index (BMI): 29.3 Blood Pressure (mmHg): 152/66 Capillary Blood Glucose (mg/dl): 240 Reference Range: 80 - 120 mg / dl Airway Pulse Oximetry (%): 98 Electronic Signature(s) Signed: 02/24/2021 1:50:01 PM By: [name] [name] EMT Entered By: [name] [name] on 02/24/2021 11:18:08

## 2021-02-25 ENCOUNTER — Encounter (HOSPITAL_BASED_OUTPATIENT_CLINIC_OR_DEPARTMENT_OTHER): Payer: Medicare HMO | Admitting: Internal Medicine

## 2021-02-25 DIAGNOSIS — I129 Hypertensive chronic kidney disease with stage 1 through stage 4 chronic kidney disease, or unspecified chronic kidney disease: Secondary | ICD-10-CM | POA: Diagnosis not present

## 2021-02-25 DIAGNOSIS — Z7984 Long term (current) use of oral hypoglycemic drugs: Secondary | ICD-10-CM | POA: Diagnosis not present

## 2021-02-25 DIAGNOSIS — E1140 Type 2 diabetes mellitus with diabetic neuropathy, unspecified: Secondary | ICD-10-CM | POA: Diagnosis not present

## 2021-02-25 DIAGNOSIS — L97511 Non-pressure chronic ulcer of other part of right foot limited to breakdown of skin: Secondary | ICD-10-CM | POA: Diagnosis not present

## 2021-02-25 DIAGNOSIS — E11621 Type 2 diabetes mellitus with foot ulcer: Secondary | ICD-10-CM | POA: Diagnosis not present

## 2021-02-25 DIAGNOSIS — L97518 Non-pressure chronic ulcer of other part of right foot with other specified severity: Secondary | ICD-10-CM | POA: Diagnosis not present

## 2021-02-25 DIAGNOSIS — G4733 Obstructive sleep apnea (adult) (pediatric): Secondary | ICD-10-CM | POA: Diagnosis not present

## 2021-02-25 DIAGNOSIS — I2510 Atherosclerotic heart disease of native coronary artery without angina pectoris: Secondary | ICD-10-CM | POA: Diagnosis not present

## 2021-02-25 DIAGNOSIS — M86371 Chronic multifocal osteomyelitis, right ankle and foot: Secondary | ICD-10-CM | POA: Diagnosis not present

## 2021-02-25 DIAGNOSIS — Z8589 Personal history of malignant neoplasm of other organs and systems: Secondary | ICD-10-CM | POA: Diagnosis not present

## 2021-02-25 DIAGNOSIS — E1169 Type 2 diabetes mellitus with other specified complication: Secondary | ICD-10-CM | POA: Diagnosis not present

## 2021-02-25 DIAGNOSIS — I252 Old myocardial infarction: Secondary | ICD-10-CM | POA: Diagnosis not present

## 2021-02-25 DIAGNOSIS — N1831 Chronic kidney disease, stage 3a: Secondary | ICD-10-CM | POA: Diagnosis not present

## 2021-02-25 DIAGNOSIS — E1122 Type 2 diabetes mellitus with diabetic chronic kidney disease: Secondary | ICD-10-CM | POA: Diagnosis not present

## 2021-02-25 DIAGNOSIS — E1151 Type 2 diabetes mellitus with diabetic peripheral angiopathy without gangrene: Secondary | ICD-10-CM | POA: Diagnosis not present

## 2021-02-25 LAB — GLUCOSE, CAPILLARY
Glucose-Capillary: 276 mg/dL — ABNORMAL HIGH (ref 70–99)
Glucose-Capillary: 240 mg/dL — ABNORMAL HIGH (ref 70–99)

## 2021-02-25 NOTE — Progress Notes (Signed)
ESHWAR, [name] [name] ([id number]) Visit Report for 02/24/2021 SuperBill Details Patient Name: Date of Service: [name] [name], [name] [name] 02/24/2021 Medical Record Number: [id number] Patient Account Number: [id number] Date of Birth/Sex: Treating RN: Nov 29, 1950 (70 y.o. [name] Primary Care Provider: [name] Other Clinician: Donavan Burnet Referring Provider: Treating Provider/Extender: Oren Section in Treatment: 4 Diagnosis Coding ICD-10 Codes Code Description [id number] Type 2 diabetes mellitus with foot ulcer L97.518 Non-pressure chronic ulcer of other part of right foot with other specified severity L97.511 Non-pressure chronic ulcer of other part of right foot limited to breakdown of skin M86.371 Chronic multifocal osteomyelitis, right ankle and foot E11.621 Type 2 diabetes mellitus with foot ulcer Facility Procedures CPT4 Code Description Modifier Quantity [id number] G0277-(Facility Use Only) HBOT full body chamber, 39mn , 4 ICD-10 Diagnosis Description E11.621 Type 2 diabetes mellitus with foot ulcer L97.518 Non-pressure chronic ulcer of other part of right foot with other specified severity L97.511 Non-pressure chronic ulcer of other part of right foot limited to breakdown of skin M86.371 Chronic multifocal osteomyelitis, right ankle and foot Physician Procedures Quantity CPT4 Code Description Modifier [id number] PHYS HYPERBARIC OXYGEN THERAPY 1 ICD-10 Diagnosis Description E11.621 Type 2 diabetes mellitus with foot ulcer L97.518 Non-pressure chronic ulcer of other part of right foot with other specified severity L97.511 Non-pressure chronic ulcer of other part of right foot limited to breakdown of skin M86.371 Chronic multifocal osteomyelitis, right ankle and foot Electronic Signature(s) Signed: 02/24/2021 1:50:01 PM By: SDonavan BurnetEMT Signed: 02/25/2021 5:15:26 PM By: HKalman ShanDO Entered By: SDonavan Burneton  02/24/2021 11:17:11

## 2021-02-25 NOTE — Progress Notes (Signed)
YACQUB, CICOTTE ([id number]) Visit Report for 02/25/2021 SuperBill Details Patient Name: Date of Service: AHMADOU, TALBURT 02/25/2021 Medical Record Number: [id number] Patient Account Number: [id number] Date of Birth/Sex: Treating RN: 1951/02/23 (70 y.o. [name], [name] Primary Care Provider: [name] Other Clinician: Donavan Burnet Referring Provider: Treating Provider/Extender: Lanier Clam in Treatment: 4 Diagnosis Coding ICD-10 Codes Code Description [phone] Type 2 diabetes mellitus with foot ulcer L97.518 Non-pressure chronic ulcer of other part of right foot with other specified severity L97.511 Non-pressure chronic ulcer of other part of right foot limited to breakdown of skin M86.371 Chronic multifocal osteomyelitis, right ankle and foot E11.621 Type 2 diabetes mellitus with foot ulcer Facility Procedures CPT4 Code Description Modifier Quantity [id number] G0277-(Facility Use Only) HBOT full body chamber, 85mn , 4 ICD-10 Diagnosis Description E11.621 Type 2 diabetes mellitus with foot ulcer L97.518 Non-pressure chronic ulcer of other part of right foot with other specified severity L97.511 Non-pressure chronic ulcer of other part of right foot limited to breakdown of skin M86.371 Chronic multifocal osteomyelitis, right ankle and foot Physician Procedures Quantity CPT4 Code Description Modifier [id number] PHYS HYPERBARIC OXYGEN THERAPY 1 ICD-10 Diagnosis Description E11.621 Type 2 diabetes mellitus with foot ulcer L97.518 Non-pressure chronic ulcer of other part of right foot with other specified severity L97.511 Non-pressure chronic ulcer of other part of right foot limited to breakdown of skin M86.371 Chronic multifocal osteomyelitis, right ankle and foot Electronic Signature(s) Signed: 02/25/2021 4:53:39 PM By: RLinton HamMD Signed: 02/25/2021 5:02:04 PM By: SDonavan BurnetEMT Entered By: SDonavan Burneton  02/25/2021 10:56:14

## 2021-02-25 NOTE — Progress Notes (Signed)
[name] [name] ([id number]) Visit Report for 02/25/2021 HBO Details Patient Name: Date of Service: [name] [name] 02/25/2021 8:00 A M Medical Record Number: [id number] Patient Account Number: [id number] Date of Birth/Sex: Treating RN: Jan 15, 1951 (70 y.o. [name] [name], [place] Primary Care Taheerah Guldin: [name] Other Clinician: Donavan Burnet Referring Heli Dino: Treating Voncile Schwarz/Extender: Lanier Clam in Treatment: 4 HBO Treatment Course Details Treatment Course Number: 1 Ordering Ilona Colley: Bernerd Pho Treatments Ordered: otal 40 HBO Treatment Start Date: 01/27/2021 HBO Indication: Diabetic Ulcer(s) of the Lower Extremity HBO Treatment Details Treatment Number: 17 Patient Type: Outpatient Chamber Type: Monoplace Chamber Serial #: [id number] Treatment Protocol: 2.5 ATA with 90 minutes oxygen, with two 5 minute air breaks Treatment Details Compression Rate Down: 2.5 psi / minute De-Compression Rate Up: 2.5 psi / minute A breaks and breathing ir Compress Tx Pressure periods Decompress Decompress Begins Reached (leave unused spaces Begins Ends blank) Chamber Pressure (ATA 1 2.5 2.5 2.5 2.5 2.5 - - 2.5 1 ) Clock Time (24 hr) 08:14 08:23 08:54 08:59 09:29 09:34 - - 10:04 10:13 Treatment Length: 119 (minutes) Treatment Segments: 4 Vital Signs Capillary Blood Glucose Reference Range: 80 - 120 mg / dl HBO Diabetic Blood Glucose Intervention Range: <131 mg/dl or >249 mg/dl Type: Time Vitals Blood Pulse: Respiratory Temperature: Capillary Blood Glucose Pulse Action Taken: Pressure: Rate: Glucose (mg/dl): Meter #: Oximetry (%) Taken: Pre 08:15 140/57 56 18 97.9 276 98 Alerted physician glucose level per protocol. Post 10:14 135/51 59 18 97.8 240 100 Treatment Response Treatment Toleration: Well Treatment Completion Status: Treatment Completed without Adverse Event Additional Procedure Documentation Tissue Sevierity: Limited to breakdown of  skin Jerline Linzy Notes Patient tolerated treatment well today. Patient has tympanostomy tubes, chamber was compressed at 2.5 psi/min. Patient tolerated well. No problems with actual treatment. Patient expressed concern that we have not looked at his actual wound in 2 weeks. I verified this and will take steps to rectify this Physician HBO Attestation: I certify that I supervised this HBO treatment in accordance with Medicare guidelines. A trained emergency response team is readily available per Yes hospital policies and procedures. Continue HBOT as ordered. Yes Electronic Signature(s) Signed: 02/25/2021 4:53:39 PM By: Linton Ham MD Entered By: Linton Ham on 02/25/2021 16:48:36 -------------------------------------------------------------------------------- HBO Safety Checklist Details Patient Name: Date of Service: [name] Hawthorne B. 02/25/2021 8:00 A M Medical Record Number: [id number] Patient Account Number: [id number] Date of Birth/Sex: Treating RN: 06-30-1951 (70 y.o. [name] [name], [name] Primary Care [name] [name]: [name] Other Clinician: Donavan Burnet Referring [name] [name]: Treating [name] [name]/Extender: Lanier Clam in Treatment: 4 HBO Safety Checklist Items Safety Checklist Consent Form Signed Patient voided / foley secured and emptied When did you last eato 0700 Last dose of injectable or oral agent metformin after yesterday's HBO Tx Ostomy pouch emptied and vented if applicable NA All implantable devices assessed, documented and approved NA Intravenous access site secured and place NA Valuables secured Linens and cotton and cotton/polyester blend (less than 51% polyester) Personal oil-based products / skin lotions / body lotions removed Wigs or hairpieces removed NA Smoking or tobacco materials removed NA Books / newspapers / magazines / loose paper removed Cologne, aftershave, perfume and deodorant removed Jewelry removed (may  wrap wedding band) NA Make-up removed NA Hair care products removed NA Battery operated devices (external) removed Heating patches and chemical warmers removed Titanium eyewear removed Nail polish cured greater than 10 hours NA Casting material cured greater than 10 hours NA Hearing aids removed NA  Loose dentures or partials removed NA Prosthetics have been removed NA Patient demonstrates correct use of air break device (if applicable) Patient concerns have been addressed Patient grounding bracelet on and cord attached to chamber Specifics for Inpatients (complete in addition to above) Medication sheet sent with patient NA Intravenous medications needed or due during therapy sent with patient NA Drainage tubes (e.g. nasogastric tube or chest tube secured and vented) NA Endotracheal or Tracheotomy tube secured NA Cuff deflated of air and inflated with saline NA Airway suctioned NA Electronic Signature(s) Signed: 02/25/2021 5:02:04 PM By: Donavan Burnet EMT Entered By: Donavan Burnet on 02/25/2021 09:10:54

## 2021-02-25 NOTE — Progress Notes (Signed)
MCGARRETT, SONNER ([id number]) Visit Report for 02/24/2021 HBO Details Patient Name: Date of Service: [name] [name], [name] [name] 02/24/2021 8:00 A M Medical Record Number: [id number] Patient Account Number: [id number] Date of Birth/Sex: Treating RN: [birth year] (70 y.o. [name] Primary Care Auguste Tebbetts: [name] Other Clinician: Donavan Burnet Referring Wandalene Abrams: Treating Tyge Somers/Extender: Oren Section in Treatment: 4 HBO Treatment Course Details Treatment Course Number: 1 Ordering Pat Elicker: Bernerd Pho Treatments Ordered: otal 40 HBO Treatment Start Date: 01/27/2021 HBO Indication: Diabetic Ulcer(s) of the Lower Extremity HBO Treatment Details Treatment Number: 16 Patient Type: Outpatient Chamber Type: Monoplace Chamber Serial #: [id number] Treatment Protocol: 2.5 ATA with 90 minutes oxygen, with two 5 minute air breaks Treatment Details Compression Rate Down: 2.0 psi / minute De-Compression Rate Up: 2.5 psi / minute A breaks and breathing ir Compress Tx Pressure periods Decompress Decompress Begins Reached (leave unused spaces Begins Ends blank) Chamber Pressure (ATA 1 2.5 2.5 2.5 2.5 2.5 - - 2.5 1 ) Clock Time (24 hr) 08:11 08:23 08:52 08:57 09:27 09:32 - - 10:02 10:11 Treatment Length: 120 (minutes) Treatment Segments: 4 Vital Signs Capillary Blood Glucose Reference Range: 80 - 120 mg / dl HBO Diabetic Blood Glucose Intervention Range: <131 mg/dl or >249 mg/dl Time Vitals Blood Respiratory Capillary Blood Glucose Pulse Action Type: Pulse: Temperature: Taken: Pressure: Rate: Glucose (mg/dl): Meter #: Oximetry (%) Taken: Pre 07:42 152/66 58 18 98.4 240 98 Post 10:22 141/55 57 18 97.8 236 100 Treatment Response Treatment Toleration: Well Treatment Completion Status: Treatment Completed without Adverse Event Additional Procedure Documentation Tissue Sevierity: Limited to breakdown of skin Buena Boehm Notes Patient tolerated  treatment well today with no issues or concerns. Physician HBO Attestation: I certify that I supervised this HBO treatment in accordance with Medicare guidelines. A trained emergency response team is readily available per Yes hospital policies and procedures. Continue HBOT as ordered. Yes Electronic Signature(s) Signed: 02/25/2021 5:15:26 PM By: Kalman Shan DO Previous Signature: 02/24/2021 1:50:01 PM Version By: Donavan Burnet EMT Entered By: Kalman Shan on 02/25/2021 09:23:31 -------------------------------------------------------------------------------- HBO Safety Checklist Details Patient Name: Date of Service: [name] Hawthorne B. 02/24/2021 8:00 A M Medical Record Number: [id number] Patient Account Number: [id number] Date of Birth/Sex: Treating RN: 12-30-1950 (70 y.o. [name] Primary Care Pattiann Solanki: [name] Other Clinician: Donavan Burnet Referring Trish Mancinelli: Treating Audri Kozub/Extender: Oren Section in Treatment: 4 HBO Safety Checklist Items Safety Checklist Consent Form Signed Patient voided / foley secured and emptied When did you last eato 0700 Last dose of injectable or oral agent Metformin yesterday Ostomy pouch emptied and vented if applicable NA All implantable devices assessed, documented and approved NA Intravenous access site secured and place NA Valuables secured Linens and cotton and cotton/polyester blend (less than 51% polyester) Personal oil-based products / skin lotions / body lotions removed Wigs or hairpieces removed NA Smoking or tobacco materials removed NA Books / newspapers / magazines / loose paper removed Cologne, aftershave, perfume and deodorant removed Jewelry removed (may wrap wedding band) Make-up removed NA Hair care products removed NA Battery operated devices (external) removed Heating patches and chemical warmers removed Titanium eyewear removed Nail polish cured greater than  10 hours NA Casting material cured greater than 10 hours NA Hearing aids removed NA Loose dentures or partials removed NA Prosthetics have been removed NA Patient demonstrates correct use of air break device (if applicable) Patient concerns have been addressed Patient grounding bracelet on and cord attached to chamber Specifics for Inpatients (  complete in addition to above) Medication sheet sent with patient NA Intravenous medications needed or due during therapy sent with patient NA Drainage tubes (e.g. nasogastric tube or chest tube secured and vented) NA Endotracheal or Tracheotomy tube secured NA Cuff deflated of air and inflated with saline NA Airway suctioned NA Electronic Signature(s) Signed: 02/24/2021 1:50:01 PM By: Donavan Burnet EMT Entered By: Donavan Burnet on 02/24/2021 11:14:53

## 2021-02-25 NOTE — Progress Notes (Signed)
JAYDIS, WILLIAM ([id number]) Visit Report for 02/25/2021 Arrival Information Details Patient Name: Date of Service: TAIWO, GAGLIANO 02/25/2021 8:00 A M Medical Record Number: [id number] Patient Account Number: [id number] Date of Birth/Sex: Treating RN: 1951-03-31 (70 y.o. [name], [name] Primary Care Nyrie Sigal: [name] Other Clinician: Donavan Burnet Referring Kila Godina: Treating Antonietta Lansdowne/Extender: Lanier Clam in Treatment: 4 Visit Information History Since Last Visit All ordered tests and consults were completed: Yes Patient Arrived: Ambulatory Added or deleted any medications: No Arrival Time: 07:30 Any new allergies or adverse reactions: No Accompanied By: self Had a fall or experienced change in No Transfer Assistance: None activities of daily living that may affect Patient Identification Verified: Yes risk of falls: Secondary Verification Process Completed: Yes Signs or symptoms of abuse/neglect since last visito No Patient Requires Transmission-Based Precautions: No Hospitalized since last visit: No Patient Has Alerts: Yes Implantable device outside of the clinic excluding No Patient Alerts: R ABI non compressible cellular tissue based products placed in the center L ABI non compressible since last visit: Pain Present Now: No Electronic Signature(s) Signed: 02/25/2021 5:02:04 PM By: Donavan Burnet EMT Entered By: Donavan Burnet on 02/25/2021 09:08:44 -------------------------------------------------------------------------------- Encounter Discharge Information Details Patient Name: Date of Service: [name] 02/25/2021 8:00 A M Medical Record Number: [id number] Patient Account Number: [id number] Date of Birth/Sex: Treating RN: 1951-06-01 (70 y.o. Hessie Diener Primary Care Onnie Alatorre: [name] Other Clinician: Donavan Burnet Referring Chanele Douglas: Treating Tedric Leeth/Extender: Lanier Clam in Treatment: 4 Encounter Discharge Information Items Discharge Condition: Stable Ambulatory Status: Ambulatory Discharge Destination: Other (Note Required) Transportation: Private Auto Accompanied By: self Schedule Follow-up Appointment: No Clinical Summary of Care: Electronic Signature(s) Signed: 02/25/2021 5:02:04 PM By: Donavan Burnet EMT Entered By: Donavan Burnet on 02/25/2021 10:56:48 -------------------------------------------------------------------------------- Vitals Details Patient Name: Date of Service: [name] 02/25/2021 8:00 A M Medical Record Number: [id number] Patient Account Number: [id number] Date of Birth/Sex: Treating RN: 01-13-1951 (70 y.o. [name], [name] Primary Care Mayumi Summerson: [name] Other Clinician: Donavan Burnet Referring Juriel Cid: Treating Demeco Ducksworth/Extender: Lanier Clam in Treatment: 4 Vital Signs Time Taken: 08:15 Temperature (F): 97.9 Height (in): 73 Pulse (bpm): 56 Weight (lbs): 222 Respiratory Rate (breaths/min): 18 Body Mass Index (BMI): 29.3 Blood Pressure (mmHg): 140/57 Capillary Blood Glucose (mg/dl): 276 Reference Range: 80 - 120 mg / dl Airway Pulse Oximetry (%): 98 Electronic Signature(s) Signed: 02/25/2021 5:02:04 PM By: Donavan Burnet EMT Entered By: Donavan Burnet on 02/25/2021 [id number]

## 2021-02-26 ENCOUNTER — Other Ambulatory Visit: Payer: Self-pay

## 2021-02-26 ENCOUNTER — Encounter (HOSPITAL_BASED_OUTPATIENT_CLINIC_OR_DEPARTMENT_OTHER): Payer: Medicare HMO | Admitting: Physician Assistant

## 2021-02-26 DIAGNOSIS — G4733 Obstructive sleep apnea (adult) (pediatric): Secondary | ICD-10-CM | POA: Diagnosis not present

## 2021-02-26 DIAGNOSIS — I2510 Atherosclerotic heart disease of native coronary artery without angina pectoris: Secondary | ICD-10-CM | POA: Diagnosis not present

## 2021-02-26 DIAGNOSIS — L97511 Non-pressure chronic ulcer of other part of right foot limited to breakdown of skin: Secondary | ICD-10-CM | POA: Diagnosis not present

## 2021-02-26 DIAGNOSIS — E1151 Type 2 diabetes mellitus with diabetic peripheral angiopathy without gangrene: Secondary | ICD-10-CM | POA: Diagnosis not present

## 2021-02-26 DIAGNOSIS — I129 Hypertensive chronic kidney disease with stage 1 through stage 4 chronic kidney disease, or unspecified chronic kidney disease: Secondary | ICD-10-CM | POA: Diagnosis not present

## 2021-02-26 DIAGNOSIS — E1169 Type 2 diabetes mellitus with other specified complication: Secondary | ICD-10-CM | POA: Diagnosis not present

## 2021-02-26 DIAGNOSIS — Z7984 Long term (current) use of oral hypoglycemic drugs: Secondary | ICD-10-CM | POA: Diagnosis not present

## 2021-02-26 DIAGNOSIS — Z8589 Personal history of malignant neoplasm of other organs and systems: Secondary | ICD-10-CM | POA: Diagnosis not present

## 2021-02-26 DIAGNOSIS — E1122 Type 2 diabetes mellitus with diabetic chronic kidney disease: Secondary | ICD-10-CM | POA: Diagnosis not present

## 2021-02-26 DIAGNOSIS — N1831 Chronic kidney disease, stage 3a: Secondary | ICD-10-CM | POA: Diagnosis not present

## 2021-02-26 DIAGNOSIS — M86371 Chronic multifocal osteomyelitis, right ankle and foot: Secondary | ICD-10-CM | POA: Diagnosis not present

## 2021-02-26 DIAGNOSIS — E11621 Type 2 diabetes mellitus with foot ulcer: Secondary | ICD-10-CM | POA: Diagnosis not present

## 2021-02-26 DIAGNOSIS — L97518 Non-pressure chronic ulcer of other part of right foot with other specified severity: Secondary | ICD-10-CM | POA: Diagnosis not present

## 2021-02-26 DIAGNOSIS — I252 Old myocardial infarction: Secondary | ICD-10-CM | POA: Diagnosis not present

## 2021-02-26 DIAGNOSIS — E1140 Type 2 diabetes mellitus with diabetic neuropathy, unspecified: Secondary | ICD-10-CM | POA: Diagnosis not present

## 2021-02-26 LAB — GLUCOSE, CAPILLARY
Glucose-Capillary: 164 mg/dL — ABNORMAL HIGH (ref 70–99)
Glucose-Capillary: 144 mg/dL — ABNORMAL HIGH (ref 70–99)

## 2021-02-26 NOTE — Progress Notes (Signed)
[name] [name], [name] [name] ([id number]) Visit Report for 02/26/2021 Problem List Details Patient Name: Date of Service: [name] [name], [name] [name] 02/26/2021 8:00 A M Medical Record Number: [id number] Patient Account Number: [id number] Date of Birth/Sex: Treating RN: 1951/01/22 (70 y.o. M) Primary Care Provider: [name] Other Clinician: Donavan Burnet Referring Provider: Treating Provider/Extender: Tally Joe in Treatment: 5 Active Problems ICD-10 Encounter Code Description Active Date MDM Diagnosis E11.621 Type 2 diabetes mellitus with foot ulcer 01/22/2021 No Yes L97.518 Non-pressure chronic ulcer of other part of right foot with other specified 01/22/2021 No Yes severity L97.511 Non-pressure chronic ulcer of other part of right foot limited to breakdown of 01/22/2021 No Yes skin M86.371 Chronic multifocal osteomyelitis, right ankle and foot 01/22/2021 No Yes E11.621 Type 2 diabetes mellitus with foot ulcer 01/22/2021 No Yes Inactive Problems Resolved Problems Electronic Signature(s) Signed: 02/26/2021 4:50:11 PM By: Worthy Keeler PA-C Entered By: Worthy Keeler on 02/26/2021 16:50:11 -------------------------------------------------------------------------------- SuperBill Details Patient Name: Date of Service: [name] [name] 02/26/2021 Medical Record Number: [id number] Patient Account Number: [id number] Date of Birth/Sex: Treating RN: 30-Oct-1950 (70 y.o. M) Primary Care Provider: [name] Other Clinician: Donavan Burnet Referring Provider: Treating Provider/Extender: Tally Joe in Treatment: 5 Diagnosis Coding ICD-10 Codes Code Description [phone] Type 2 diabetes mellitus with foot ulcer L97.518 Non-pressure chronic ulcer of other part of right foot with other specified severity L97.511 Non-pressure chronic ulcer of other part of right foot limited to breakdown of skin M86.371 Chronic multifocal osteomyelitis,  right ankle and foot E11.621 Type 2 diabetes mellitus with foot ulcer Facility Procedures CPT4 Code: [id number] Description: G0277-(Facility Use Only) HBOT full body chamber, 56mn , ICD-10 Diagnosis Description E11.621 Type 2 diabetes mellitus with foot ulcer L97.518 Non-pressure chronic ulcer of other part of right foot with other specified severity L97.511  Non-pressure chronic ulcer of other part of right foot limited to breakdown of skin M86.371 Chronic multifocal osteomyelitis, right ankle and foot Modifier: Quantity: 4 Physician Procedures : CPT4 Code Description Modifier [id number] PHYS HYPERBARIC OXYGEN THERAPY ICD-10 Diagnosis Description E11.621 Type 2 diabetes mellitus with foot ulcer L97.518 Non-pressure chronic ulcer of other part of right foot with other specified severity  L97.511 Non-pressure chronic ulcer of other part of right foot limited to breakdown of skin M86.371 Chronic multifocal osteomyelitis, right ankle and foot Quantity: 1 Electronic Signature(s) Signed: 02/26/2021 4:50:08 PM By: SWorthy KeelerPA-C Entered By: SWorthy Keeleron 02/26/2021 16:50:07

## 2021-02-27 ENCOUNTER — Encounter (HOSPITAL_BASED_OUTPATIENT_CLINIC_OR_DEPARTMENT_OTHER): Payer: Medicare HMO | Admitting: Internal Medicine

## 2021-02-27 DIAGNOSIS — M86371 Chronic multifocal osteomyelitis, right ankle and foot: Secondary | ICD-10-CM | POA: Diagnosis not present

## 2021-02-27 DIAGNOSIS — N1831 Chronic kidney disease, stage 3a: Secondary | ICD-10-CM | POA: Diagnosis not present

## 2021-02-27 DIAGNOSIS — E1140 Type 2 diabetes mellitus with diabetic neuropathy, unspecified: Secondary | ICD-10-CM | POA: Diagnosis not present

## 2021-02-27 DIAGNOSIS — G4733 Obstructive sleep apnea (adult) (pediatric): Secondary | ICD-10-CM | POA: Diagnosis not present

## 2021-02-27 DIAGNOSIS — L97512 Non-pressure chronic ulcer of other part of right foot with fat layer exposed: Secondary | ICD-10-CM | POA: Diagnosis not present

## 2021-02-27 DIAGNOSIS — E1151 Type 2 diabetes mellitus with diabetic peripheral angiopathy without gangrene: Secondary | ICD-10-CM | POA: Diagnosis not present

## 2021-02-27 DIAGNOSIS — I129 Hypertensive chronic kidney disease with stage 1 through stage 4 chronic kidney disease, or unspecified chronic kidney disease: Secondary | ICD-10-CM | POA: Diagnosis not present

## 2021-02-27 DIAGNOSIS — Z8589 Personal history of malignant neoplasm of other organs and systems: Secondary | ICD-10-CM | POA: Diagnosis not present

## 2021-02-27 DIAGNOSIS — I2510 Atherosclerotic heart disease of native coronary artery without angina pectoris: Secondary | ICD-10-CM | POA: Diagnosis not present

## 2021-02-27 DIAGNOSIS — Z7984 Long term (current) use of oral hypoglycemic drugs: Secondary | ICD-10-CM | POA: Diagnosis not present

## 2021-02-27 DIAGNOSIS — L97511 Non-pressure chronic ulcer of other part of right foot limited to breakdown of skin: Secondary | ICD-10-CM | POA: Diagnosis not present

## 2021-02-27 DIAGNOSIS — E11621 Type 2 diabetes mellitus with foot ulcer: Secondary | ICD-10-CM | POA: Diagnosis not present

## 2021-02-27 DIAGNOSIS — L97518 Non-pressure chronic ulcer of other part of right foot with other specified severity: Secondary | ICD-10-CM | POA: Diagnosis not present

## 2021-02-27 DIAGNOSIS — E1169 Type 2 diabetes mellitus with other specified complication: Secondary | ICD-10-CM | POA: Diagnosis not present

## 2021-02-27 DIAGNOSIS — E1122 Type 2 diabetes mellitus with diabetic chronic kidney disease: Secondary | ICD-10-CM | POA: Diagnosis not present

## 2021-02-27 DIAGNOSIS — I252 Old myocardial infarction: Secondary | ICD-10-CM | POA: Diagnosis not present

## 2021-02-27 LAB — GLUCOSE, CAPILLARY
Glucose-Capillary: 188 mg/dL — ABNORMAL HIGH (ref 70–99)
Glucose-Capillary: 153 mg/dL — ABNORMAL HIGH (ref 70–99)

## 2021-02-27 NOTE — Progress Notes (Signed)
POWELL, GRELLE ([id number]) Visit Report for 02/27/2021 SuperBill Details Patient Name: Date of Service: [name], [name] 02/27/2021 Medical Record Number: [id number] Patient Account Number: [id number] Date of Birth/Sex: Treating RN: June 13, 1951 (70 y.o. [name] [name], [name] [name] Primary Care Provider: [name] Other Clinician: Donavan Burnet Referring Provider: Treating Provider/Extender: Lanier Clam in Treatment: 5 Diagnosis Coding ICD-10 Codes Code Description [phone] Type 2 diabetes mellitus with foot ulcer L97.518 Non-pressure chronic ulcer of other part of right foot with other specified severity L97.511 Non-pressure chronic ulcer of other part of right foot limited to breakdown of skin M86.371 Chronic multifocal osteomyelitis, right ankle and foot E11.621 Type 2 diabetes mellitus with foot ulcer Facility Procedures CPT4 Code Description Modifier Quantity [id number] G0277-(Facility Use Only) HBOT full body chamber, 27mn , 4 ICD-10 Diagnosis Description E11.621 Type 2 diabetes mellitus with foot ulcer L97.518 Non-pressure chronic ulcer of other part of right foot with other specified severity L97.511 Non-pressure chronic ulcer of other part of right foot limited to breakdown of skin M86.371 Chronic multifocal osteomyelitis, right ankle and foot Physician Procedures Quantity CPT4 Code Description Modifier [id number] PHYS HYPERBARIC OXYGEN THERAPY 1 ICD-10 Diagnosis Description E11.621 Type 2 diabetes mellitus with foot ulcer L97.518 Non-pressure chronic ulcer of other part of right foot with other specified severity L97.511 Non-pressure chronic ulcer of other part of right foot limited to breakdown of skin M86.371 Chronic multifocal osteomyelitis, right ankle and foot Electronic Signature(s) Signed: 02/27/2021 11:13:46 AM By: SDonavan BurnetEMT Signed: 02/27/2021 4:52:40 PM By: RLinton HamMD Entered By: SDonavan Burneton  02/27/2021 11:13:46

## 2021-02-27 NOTE — Progress Notes (Signed)
SARON, DOFFLEMYER ([id number]) Visit Report for 02/27/2021 Debridement Details Patient Name: Date of Service: [name] [name], [name] [name] 02/27/2021 10:30 A M Medical Record Number: [id number] Patient Account Number: [id number] Date of Birth/Sex: Treating RN: [birth year] (70 y.o. [name], [name] Primary Care Provider: [name] Other Clinician: Referring Provider: Treating Provider/Extender: Lanier Clam in Treatment: 5 Debridement Performed for Assessment: Wound #3 Right,Plantar T Great oe Performed By: Physician Ricard Dillon., MD Debridement Type: Debridement Severity of Tissue Pre Debridement: Fat layer exposed Level of Consciousness (Pre-procedure): Awake and Alert Pre-procedure Verification/Time Out Yes - 10:58 Taken: Start Time: 10:58 T Area Debrided (L x W): otal 0.5 (cm) x 0.5 (cm) = 0.25 (cm) Tissue and other material debrided: Non-Viable, Callus Level: Non-Viable Tissue Debridement Description: Selective/Open Wound Instrument: Curette Bleeding: Minimum Hemostasis Achieved: Pressure End Time: 11:00 Procedural Pain: 0 Post Procedural Pain: 0 Response to Treatment: Procedure was tolerated well Level of Consciousness (Post- Awake and Alert procedure): Post Debridement Measurements of Total Wound Length: (cm) 0.1 Width: (cm) 0.1 Depth: (cm) 0.1 Volume: (cm) 0.001 Character of Wound/Ulcer Post Debridement: Improved Severity of Tissue Post Debridement: Fat layer exposed Post Procedure Diagnosis Same as Pre-procedure Electronic Signature(s) Signed: 02/27/2021 4:52:40 PM By: Linton Ham MD Signed: 02/27/2021 6:11:30 PM By: Deon Pilling Entered By: Linton Ham on 02/27/2021 11:08:51 -------------------------------------------------------------------------------- HPI Details Patient Name: Date of Service: [name] Hawthorne B. 02/27/2021 10:30 A M Medical Record Number: [id number] Patient Account Number: [id number] Date of Birth/Sex:  Treating RN: July 08, 1951 (70 y.o. [name] [name] Primary Care Provider: [name] Other Clinician: Referring Provider: Treating Provider/Extender: Lanier Clam in Treatment: 5 History of Present Illness HPI Description: ADMISSION 01/22/2021 This is a 70 year old man with type 2 diabetes and diabetic neuropathy on metformin. He has been referred to our clinic from the wound care center in [facility] by Dr. Adelina Mings for consideration of hyperbaric oxygen therapy. Looking through [facility] health link the patient has been followed by Dr. Rocco Serene since the fall of last year at which time he had a Wagner grade 2 wound on the plantar aspect of the right great toe. This deteriorated according to the patient in June when the toe began rapidly more swollen. Ultimately he required admission to hospital from 01/01/2021 through 01/07/2021 at which time he had streaking erythema up his foot. He had an MRI done on 12/26/2020 which showed medial plantar ulceration of the great toe to bone with diffuse soft tissue swelling of the great toe with enhancement. No fluid collection or hematoma there was osteomyelitis of the first proximal and distal phalanx no abscess. He also had small erosions of the first metatarsal head and first TMT joint suspicious for gout. Cultures grew group B strep and Prevotella. He was treated with IV Invanz and p.o. ciprofloxacin. In fact he was just stopped of the ciprofloxacin by infectious disease today although he remains on IV Invanz until the beginning of August. He is tolerating this well. There is been marked improvement in his wounds and he was referred for consideration of additional hyperbaric oxygen therapy. Past medical history includes type 2 diabetes, stage IIIa chronic renal failure, peripheral vascular disease, obstructive sleep apnea, coronary artery disease status post MI and CABG next hypertension next hyperlipidemia next history  of Meuth mucinous CA of the appendix. Surgically removed in 2018 ABIs were done on 05/06/2020 which showed noncompressible waveforms ABIs were biphasic at the ankles bilaterally. TBI's were not done 01/29/2021; patient is here  for follow-up from his admission last week. In the interim he started HBO yesterday which he tolerated well. He is still on his IV antibiotics [Invanz]. We have been using endoform. He has wounds on the right great toe x3 and an abrasion injury on the dorsal right third toe 8/18; he has completed 18 hyperbaric treatments. This is out of 40. He has completed his Invanz as of about 2 weeks ago PICC line was removed. The only wound remaining on his right first toe was on the plantar aspect. The lateral and and dorsal wounds are closed Electronic Signature(s) Signed: 02/27/2021 4:52:40 PM By: Linton Ham MD Entered By: Linton Ham on 02/27/2021 11:09:35 -------------------------------------------------------------------------------- Physical Exam Details Patient Name: Date of Service: [name] Hawthorne B. 02/27/2021 10:30 A M Medical Record Number: [id number] Patient Account Number: [id number] Date of Birth/Sex: Treating RN: 01-15-1951 (70 y.o. [name] [name] Primary Care Provider: [name] Other Clinician: Referring Provider: Treating Provider/Extender: Lanier Clam in Treatment: 5 Constitutional Sitting or standing Blood Pressure is within target range for patient.. Pulse regular and within target range for patient.Marland Kitchen Respirations regular, non-labored and within target range.. Temperature is normal and within the target range for the patient.Marland Kitchen Appears in no distress. Notes Wound exam; the patient initially had wounds dorsally laterally and plantar on the right great toe. The only thing that remains is the plantar. The toe is much less swollen. Still some callus and debris on the wound surface on the plantar toe I removed with a #3 curette  there is a small healthy granulated area. The skin is healing here with very little fat under the skin. This is in the region of the inner phalangeal joint of the right great toe plantar aspect. Electronic Signature(s) Signed: 02/27/2021 4:52:40 PM By: Linton Ham MD Entered By: Linton Ham on 02/27/2021 11:10:44 -------------------------------------------------------------------------------- Physician Orders Details Patient Name: Date of Service: [name] Hawthorne B. 02/27/2021 10:30 A M Medical Record Number: [id number] Patient Account Number: [id number] Date of Birth/Sex: Treating RN: 1950-10-21 (70 y.o. [name] [name] Primary Care Provider: [name] Other Clinician: Referring Provider: Treating Provider/Extender: Lanier Clam in Treatment: 5 Verbal / Phone Orders: No Diagnosis Coding ICD-10 Coding Code Description E11.621 Type 2 diabetes mellitus with foot ulcer L97.518 Non-pressure chronic ulcer of other part of right foot with other specified severity L97.511 Non-pressure chronic ulcer of other part of right foot limited to breakdown of skin M86.371 Chronic multifocal osteomyelitis, right ankle and foot E11.621 Type 2 diabetes mellitus with foot ulcer Follow-up Appointments ppointment in 1 week. - with Dr. Dellia Nims Return A Bathing/ Shower/ Hygiene May shower and wash wound with soap and water. - prior to dressing change Off-Loading Open toe surgical shoe to: - right foot Hyperbaric Oxygen Therapy Evaluate for HBO Therapy Indication: - Wagner 3 diabetic ulcer right great toe If appropriate for treatment, begin HBOT per protocol: 2.5 ATA for 90 Minutes with 2 Five (5) Minute A Breaks ir Total Number of Treatments: - 40 One treatments per day (delivered Monday through Friday unless otherwise specified in Special Instructions below): Finger stick Blood Glucose Pre- and Post- HBOT Treatment. Follow Hyperbaric Oxygen Glycemia Protocol A  frin (Oxymetazoline HCL) 0.05% nasal spray - 1 spray in both nostrils daily as needed prior to HBO treatment for difficulty clearing ears Wound Treatment Wound #3 - T Great oe Wound Laterality: Plantar, Right Cleanser: Soap and Water 1 x Per Day/7 Days Discharge Instructions: May shower and wash wound with  dial antibacterial soap and water prior to dressing change. Prim Dressing: KerraCel Ag Gelling Fiber Dressing, 2x2 in (silver alginate) 1 x Per Day/7 Days ary Discharge Instructions: Apply silver alginate to wound bed as instructed Secondary Dressing: Woven Gauze Sponges 2x2 in 1 x Per Day/7 Days Discharge Instructions: Apply over primary dressing as directed. Secured With: Child psychotherapist, Sterile 2x75 (in/in) 1 x Per Day/7 Days Discharge Instructions: Secure with stretch gauze as directed. Secured With: Paper Tape, 1x10 (in/yd) 1 x Per Day/7 Days Discharge Instructions: Secure dressing with tape as directed. GLYCEMIA INTERVENTIONS PROTOCOL PRE-HBO GLYCEMIA INTERVENTIONS ACTION INTERVENTION Obtain pre-HBO capillary blood glucose (ensure 1 physician order is in chart). A. Notify HBO physician and await physician orders. 2 If result is 70 mg/dl or below: B. If the result meets the hospital definition of a critical result, follow hospital policy. A. Give patient an 8 ounce Glucerna Shake, an 8 ounce Ensure, or 8 ounces of a Glucerna/Ensure equivalent dietary supplement*. B. Wait 30 minutes. If result is 71 mg/dl to 130 mg/dl: C. Retest patients capillary blood glucose (CBG). D. If result greater than or equal to 110 mg/dl, proceed with HBO. If result less than 110 mg/dl, notify HBO physician and consider holding HBO. If result is 131 mg/dl to 249 mg/dl: A. Proceed with HBO. A. Notify HBO physician and await physician orders. B. It is recommended to hold HBO and do If result is 250 mg/dl or greater: blood/urine ketone testing. C. If the result meets the  hospital definition of a critical result, follow hospital policy. POST-HBO GLYCEMIA INTERVENTIONS ACTION INTERVENTION Obtain post HBO capillary blood glucose (ensure 1 physician order is in chart). A. Notify HBO physician and await physician orders. 2 If result is 70 mg/dl or below: B. If the result meets the hospital definition of a critical result, follow hospital policy. A. Give patient an 8 ounce Glucerna Shake, an 8 ounce Ensure, or 8 ounces of a Glucerna/Ensure equivalent dietary supplement*. B. Wait 15 minutes for symptoms of If result is 71 mg/dl to 100 mg/dl: hypoglycemia (i.e. nervousness, anxiety, sweating, chills, clamminess, irritability, confusion, tachycardia or dizziness). C. If patient asymptomatic, discharge patient. If patient symptomatic, repeat capillary blood glucose (CBG) and notify HBO physician. If result is 101 mg/dl to 249 mg/dl: A. Discharge patient. A. Notify HBO physician and await physician orders. B. It is recommended to do blood/urine ketone If result is 250 mg/dl or greater: testing. C. If the result meets the hospital definition of a critical result, follow hospital policy. *Juice or candies are NOT equivalent products. If patient refuses the Glucerna or Ensure, please consult the hospital dietitian for an appropriate substitute. Electronic Signature(s) Signed: 02/27/2021 4:52:40 PM By: Linton Ham MD Signed: 02/27/2021 6:20:17 PM By: Levan Hurst RN, BSN Entered By: Levan Hurst on 02/27/2021 11:02:59 -------------------------------------------------------------------------------- Problem List Details Patient Name: Date of Service: [name] Hawthorne B. 02/27/2021 10:30 A M Medical Record Number: [id number] Patient Account Number: [id number] Date of Birth/Sex: Treating RN: 08/26/1950 (70 y.o. [name] [name] Primary Care Provider: [name] Other Clinician: Referring Provider: Treating Provider/Extender: Lanier Clam in Treatment: 5 Active Problems ICD-10 Encounter Code Description Active Date MDM Diagnosis E11.621 Type 2 diabetes mellitus with foot ulcer 01/22/2021 No Yes L97.518 Non-pressure chronic ulcer of other part of right foot with other specified 01/22/2021 No Yes severity L97.511 Non-pressure chronic ulcer of other part of right foot limited to breakdown of 01/22/2021 No Yes skin M86.371 Chronic multifocal osteomyelitis, right  ankle and foot 01/22/2021 No Yes E11.621 Type 2 diabetes mellitus with foot ulcer 01/22/2021 No Yes Inactive Problems Resolved Problems Electronic Signature(s) Signed: 02/27/2021 4:52:40 PM By: Linton Ham MD Entered By: Linton Ham on 02/27/2021 11:08:19 -------------------------------------------------------------------------------- Progress Note Details Patient Name: Date of Service: [name] Hawthorne B. 02/27/2021 10:30 A M Medical Record Number: [id number] Patient Account Number: [id number] Date of Birth/Sex: Treating RN: 12/06/1950 (71 y.o. [name] [name] Primary Care Provider: [name] Other Clinician: Referring Provider: Treating Provider/Extender: Lanier Clam in Treatment: 5 Subjective History of Present Illness (HPI) ADMISSION 01/22/2021 This is a 70 year old man with type 2 diabetes and diabetic neuropathy on metformin. He has been referred to our clinic from the wound care center in [facility] by Dr. Adelina Mings for consideration of hyperbaric oxygen therapy. Looking through [facility] health link the patient has been followed by Dr. Rocco Serene since the fall of last year at which time he had a Wagner grade 2 wound on the plantar aspect of the right great toe. This deteriorated according to the patient in June when the toe began rapidly more swollen. Ultimately he required admission to hospital from 01/01/2021 through 01/07/2021 at which time he had streaking erythema up his foot. He  had an MRI done on 12/26/2020 which showed medial plantar ulceration of the great toe to bone with diffuse soft tissue swelling of the great toe with enhancement. No fluid collection or hematoma there was osteomyelitis of the first proximal and distal phalanx no abscess. He also had small erosions of the first metatarsal head and first TMT joint suspicious for gout. Cultures grew group B strep and Prevotella. He was treated with IV Invanz and p.o. ciprofloxacin. In fact he was just stopped of the ciprofloxacin by infectious disease today although he remains on IV Invanz until the beginning of August. He is tolerating this well. There is been marked improvement in his wounds and he was referred for consideration of additional hyperbaric oxygen therapy. 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 Surgically removed in 2018 ABIs were done on 05/06/2020 which showed noncompressible waveforms ABIs were biphasic at the ankles bilaterally. TBI's were not done 01/29/2021; patient is here for follow-up from his admission last week. In the interim he started HBO yesterday which he tolerated well. He is still on his IV antibiotics [Invanz]. We have been using endoform. He has wounds on the right great toe x3 and an abrasion injury on the dorsal right third toe 8/18; he has completed 18 hyperbaric treatments. This is out of 40. He has completed his Invanz as of about 2 weeks ago PICC line was removed. The only wound remaining on his right first toe was on the plantar aspect. The lateral and and dorsal wounds are closed Objective Constitutional Sitting or standing Blood Pressure is within target range for patient.. Pulse regular and within target range for patient.Marland Kitchen Respirations regular,  non-labored and within target range.. Temperature is normal and within the target range for the patient.Marland Kitchen Appears in no distress. Vitals Time Taken: 10:41 AM, Height: 73 in, Weight: 222 lbs, BMI: 29.3, Temperature: 97.8 F, Pulse: 49 bpm, Respiratory Rate: 18 breaths/min, Blood Pressure: 145/74 mmHg, Capillary Blood Glucose: 153 mg/dl. General Notes: Wound exam; the patient initially had wounds dorsally laterally and plantar on the right  great toe. The only thing that remains is the plantar. The toe is much less swollen. Still some callus and debris on the wound surface on the plantar toe I removed with a #3 curette there is a small healthy granulated area. The skin is healing here with very little fat under the skin. This is in the region of the inner phalangeal joint of the right great toe plantar aspect. Integumentary (Hair, Skin) Wound #1 status is Open. Original cause of wound was Gradually Appeared. The date acquired was: 12/11/2020. The wound has been in treatment 5 weeks. The wound is located on the Right,Dorsal T [place]. The wound measures 0cm length x 0cm width x 0cm depth; 0cm^2 area and 0cm^3 volume. There is no oe tunneling or undermining noted. There is a none present amount of drainage noted. The wound margin is distinct with the outline attached to the wound base. There is no granulation within the wound bed. There is no necrotic tissue within the wound bed. Wound #2 status is Open. Original cause of wound was Gradually Appeared. The date acquired was: 12/11/2020. The wound has been in treatment 5 weeks. The wound is located on the Right,Medial T Great. The wound measures 0cm length x 0cm width x 0cm depth; 0cm^2 area and 0cm^3 volume. There is no oe tunneling or undermining noted. There is a none present amount of drainage noted. The wound margin is distinct with the outline attached to the wound base. There is no granulation within the wound bed. There is no necrotic tissue within the  wound bed. Wound #3 status is Open. Original cause of wound was Gradually Appeared. The date acquired was: 12/11/2020. The wound has been in treatment 5 weeks. The wound is located on the [employer]. The wound measures 0.1cm length x 0.1cm width x 0.1cm depth; 0.008cm^2 area and 0.001cm^3 volume. oe There is no tunneling or undermining noted. There is a small amount of serosanguineous drainage noted. The wound margin is distinct with the outline attached to the wound base. There is large (67-100%) pink, pale granulation within the wound bed. There is no necrotic tissue within the wound bed. Wound #4 status is Open. Original cause of wound was Gradually Appeared. The date acquired was: 12/11/2020. The wound has been in treatment 5 weeks. The wound is located on the Right T Third. The wound measures 0cm length x 0cm width x 0cm depth; 0cm^2 area and 0cm^3 volume. There is no tunneling or oe undermining noted. There is a none present amount of drainage noted. The wound margin is distinct with the outline attached to the wound base. There is no granulation within the wound bed. There is no necrotic tissue within the wound bed. Assessment Active Problems ICD-10 Type 2 diabetes mellitus with foot ulcer Non-pressure chronic ulcer of other part of right foot with other specified severity Non-pressure chronic ulcer of other part of right foot limited to breakdown of skin Chronic multifocal osteomyelitis, right ankle and foot Type 2 diabetes mellitus with foot ulcer Procedures Wound #3 Pre-procedure diagnosis of Wound #3 is a Diabetic Wound/Ulcer of the Lower Extremity located on the Right,Plantar T Great .Severity of Tissue Pre oe Debridement is: Fat layer exposed. There was a Selective/Open Wound Non-Viable Tissue Debridement with a total area of 0.25 sq cm performed by Ricard Dillon., MD. With the following instrument(s): Curette to remove Non-Viable tissue/material. Material removed  includes Callus. No specimens were taken. A time out was conducted at 10:58, prior to the  start of the procedure. A Minimum amount of bleeding was controlled with Pressure. The procedure was tolerated well with a pain level of 0 throughout and a pain level of 0 following the procedure. Post Debridement Measurements: 0.1cm length x 0.1cm width x 0.1cm depth; 0.001cm^3 volume. Character of Wound/Ulcer Post Debridement is improved. Severity of Tissue Post Debridement is: Fat layer exposed. Post procedure Diagnosis Wound #3: Same as Pre-Procedure Plan Follow-up Appointments: Return Appointment in 1 week. - with Dr. Dellia Nims Bathing/ Shower/ Hygiene: May shower and wash wound with soap and water. - prior to dressing change Off-Loading: Open toe surgical shoe to: - right foot Hyperbaric Oxygen Therapy: Evaluate for HBO Therapy Indication: - Wagner 3 diabetic ulcer right great toe If appropriate for treatment, begin HBOT per protocol: 2.5 ATA for 90 Minutes with 2 Five (5) Minute Air Breaks T Number of Treatments: - 40 otal One treatments per day (delivered Monday through Friday unless otherwise specified in Special Instructions below): Finger stick Blood Glucose Pre- and Post- HBOT Treatment. Follow Hyperbaric Oxygen Glycemia Protocol Afrin (Oxymetazoline HCL) 0.05% nasal spray - 1 spray in both nostrils daily as needed prior to HBO treatment for difficulty clearing ears WOUND #3: - T Great Wound Laterality: Plantar, Right oe Cleanser: Soap and Water 1 x Per Day/7 Days Discharge Instructions: May shower and wash wound with dial antibacterial soap and water prior to dressing change. Prim Dressing: KerraCel Ag Gelling Fiber Dressing, 2x2 in (silver alginate) 1 x Per Day/7 Days ary Discharge Instructions: Apply silver alginate to wound bed as instructed Secondary Dressing: Woven Gauze Sponges 2x2 in 1 x Per Day/7 Days Discharge Instructions: Apply over primary dressing as directed. Secured  With: Child psychotherapist, Sterile 2x75 (in/in) 1 x Per Day/7 Days Discharge Instructions: Secure with stretch gauze as directed. Secured With: Paper T ape, 1x10 (in/yd) 1 x Per Day/7 Days Discharge Instructions: Secure dressing with tape as directed. 1. Continue with silver alginate 2. He is offloading this correctly with a forefoot off loader. 3. He is finished antibiotics however there is still open wound present. Furthermore the presence of osteomyelitis means that we should continue pleat the course of hyperbarics as prescribed. This will give [place] the best chance of bacterial eradication, bone repair. 4. We will review this next week although the remaining wound on the plantar aspect at the level of the interphalangeal joint on the right may be closed by that point Electronic Signature(s) Signed: 02/27/2021 4:52:40 PM By: Linton Ham MD Entered By: Linton Ham on 02/27/2021 11:17:06 -------------------------------------------------------------------------------- SuperBill Details Patient Name: Date of Service: [name] [name] 02/27/2021 Medical Record Number: [id number] Patient Account Number: [id number] Date of Birth/Sex: Treating RN: 1950-09-03 (70 y.o. [name], [name] Primary Care Provider: [name] Other Clinician: Referring Provider: Treating Provider/Extender: Lanier Clam in Treatment: 5 Diagnosis Coding ICD-10 Codes Code Description [id number] Type 2 diabetes mellitus with foot ulcer L97.518 Non-pressure chronic ulcer of other part of right foot with other specified severity L97.511 Non-pressure chronic ulcer of other part of right foot limited to breakdown of skin M86.371 Chronic multifocal osteomyelitis, right ankle and foot E11.621 Type 2 diabetes mellitus with foot ulcer Facility Procedures CPT4 Code: [id number] Description: [id number] - DEBRIDE WOUND 1ST 20 SQ CM OR < ICD-10 Diagnosis Description L97.518 Non-pressure  chronic ulcer of other part of right foot with other specified sever E11.621 Type 2 diabetes mellitus with foot ulcer Modifier: ity Quantity: 1 Physician Procedures : CPT4 Code Description Modifier [id number] -  WC PHYS DEBR WO ANESTH 20 SQ CM ICD-10 Diagnosis Description L97.518 Non-pressure chronic ulcer of other part of right foot with other specified severity E11.621 Type 2 diabetes mellitus with foot ulcer Quantity: 1 Electronic Signature(s) Signed: 02/27/2021 4:52:40 PM By: Linton Ham MD Entered By: Linton Ham on 02/27/2021 11:17:25

## 2021-02-28 ENCOUNTER — Encounter (HOSPITAL_BASED_OUTPATIENT_CLINIC_OR_DEPARTMENT_OTHER): Payer: Medicare HMO | Admitting: Internal Medicine

## 2021-02-28 ENCOUNTER — Other Ambulatory Visit: Payer: Self-pay

## 2021-02-28 DIAGNOSIS — I2510 Atherosclerotic heart disease of native coronary artery without angina pectoris: Secondary | ICD-10-CM | POA: Diagnosis not present

## 2021-02-28 DIAGNOSIS — Z8589 Personal history of malignant neoplasm of other organs and systems: Secondary | ICD-10-CM | POA: Diagnosis not present

## 2021-02-28 DIAGNOSIS — E1122 Type 2 diabetes mellitus with diabetic chronic kidney disease: Secondary | ICD-10-CM | POA: Diagnosis not present

## 2021-02-28 DIAGNOSIS — L97511 Non-pressure chronic ulcer of other part of right foot limited to breakdown of skin: Secondary | ICD-10-CM

## 2021-02-28 DIAGNOSIS — I252 Old myocardial infarction: Secondary | ICD-10-CM | POA: Diagnosis not present

## 2021-02-28 DIAGNOSIS — E1140 Type 2 diabetes mellitus with diabetic neuropathy, unspecified: Secondary | ICD-10-CM | POA: Diagnosis not present

## 2021-02-28 DIAGNOSIS — E11621 Type 2 diabetes mellitus with foot ulcer: Secondary | ICD-10-CM | POA: Diagnosis not present

## 2021-02-28 DIAGNOSIS — N1831 Chronic kidney disease, stage 3a: Secondary | ICD-10-CM | POA: Diagnosis not present

## 2021-02-28 DIAGNOSIS — L97518 Non-pressure chronic ulcer of other part of right foot with other specified severity: Secondary | ICD-10-CM

## 2021-02-28 DIAGNOSIS — E1169 Type 2 diabetes mellitus with other specified complication: Secondary | ICD-10-CM | POA: Diagnosis not present

## 2021-02-28 DIAGNOSIS — Z7984 Long term (current) use of oral hypoglycemic drugs: Secondary | ICD-10-CM | POA: Diagnosis not present

## 2021-02-28 DIAGNOSIS — M86371 Chronic multifocal osteomyelitis, right ankle and foot: Secondary | ICD-10-CM | POA: Diagnosis not present

## 2021-02-28 DIAGNOSIS — E1151 Type 2 diabetes mellitus with diabetic peripheral angiopathy without gangrene: Secondary | ICD-10-CM | POA: Diagnosis not present

## 2021-02-28 DIAGNOSIS — I129 Hypertensive chronic kidney disease with stage 1 through stage 4 chronic kidney disease, or unspecified chronic kidney disease: Secondary | ICD-10-CM | POA: Diagnosis not present

## 2021-02-28 DIAGNOSIS — G4733 Obstructive sleep apnea (adult) (pediatric): Secondary | ICD-10-CM | POA: Diagnosis not present

## 2021-02-28 LAB — GLUCOSE, CAPILLARY
Glucose-Capillary: 168 mg/dL — ABNORMAL HIGH (ref 70–99)
Glucose-Capillary: 122 mg/dL — ABNORMAL HIGH (ref 70–99)

## 2021-03-03 ENCOUNTER — Encounter (HOSPITAL_BASED_OUTPATIENT_CLINIC_OR_DEPARTMENT_OTHER): Payer: Medicare HMO | Admitting: Internal Medicine

## 2021-03-03 ENCOUNTER — Other Ambulatory Visit: Payer: Self-pay

## 2021-03-03 DIAGNOSIS — E11621 Type 2 diabetes mellitus with foot ulcer: Secondary | ICD-10-CM

## 2021-03-03 DIAGNOSIS — N1831 Chronic kidney disease, stage 3a: Secondary | ICD-10-CM | POA: Diagnosis not present

## 2021-03-03 DIAGNOSIS — I252 Old myocardial infarction: Secondary | ICD-10-CM | POA: Diagnosis not present

## 2021-03-03 DIAGNOSIS — L97511 Non-pressure chronic ulcer of other part of right foot limited to breakdown of skin: Secondary | ICD-10-CM

## 2021-03-03 DIAGNOSIS — E1151 Type 2 diabetes mellitus with diabetic peripheral angiopathy without gangrene: Secondary | ICD-10-CM | POA: Diagnosis not present

## 2021-03-03 DIAGNOSIS — Z7984 Long term (current) use of oral hypoglycemic drugs: Secondary | ICD-10-CM | POA: Diagnosis not present

## 2021-03-03 DIAGNOSIS — I129 Hypertensive chronic kidney disease with stage 1 through stage 4 chronic kidney disease, or unspecified chronic kidney disease: Secondary | ICD-10-CM | POA: Diagnosis not present

## 2021-03-03 DIAGNOSIS — G4733 Obstructive sleep apnea (adult) (pediatric): Secondary | ICD-10-CM | POA: Diagnosis not present

## 2021-03-03 DIAGNOSIS — L97518 Non-pressure chronic ulcer of other part of right foot with other specified severity: Secondary | ICD-10-CM

## 2021-03-03 DIAGNOSIS — E1169 Type 2 diabetes mellitus with other specified complication: Secondary | ICD-10-CM | POA: Diagnosis not present

## 2021-03-03 DIAGNOSIS — I2510 Atherosclerotic heart disease of native coronary artery without angina pectoris: Secondary | ICD-10-CM | POA: Diagnosis not present

## 2021-03-03 DIAGNOSIS — Z8589 Personal history of malignant neoplasm of other organs and systems: Secondary | ICD-10-CM | POA: Diagnosis not present

## 2021-03-03 DIAGNOSIS — M86371 Chronic multifocal osteomyelitis, right ankle and foot: Secondary | ICD-10-CM

## 2021-03-03 DIAGNOSIS — E1122 Type 2 diabetes mellitus with diabetic chronic kidney disease: Secondary | ICD-10-CM | POA: Diagnosis not present

## 2021-03-03 DIAGNOSIS — E1140 Type 2 diabetes mellitus with diabetic neuropathy, unspecified: Secondary | ICD-10-CM | POA: Diagnosis not present

## 2021-03-03 LAB — GLUCOSE, CAPILLARY
Glucose-Capillary: 174 mg/dL — ABNORMAL HIGH (ref 70–99)
Glucose-Capillary: 145 mg/dL — ABNORMAL HIGH (ref 70–99)

## 2021-03-03 NOTE — Progress Notes (Signed)
AMIR, VONBERGEN ([id number]) Visit Report for 02/28/2021 HBO Details Patient Name: Date of Service: [name] [name], [name] [name] 02/28/2021 8:00 A M Medical Record Number: [id number] Patient Account Number: [id number] Date of Birth/Sex: Treating RN: June 26, 1951 (70 y.o. M) Primary Care Gerber Penza: [name] Other Clinician: Donavan Burnet Referring Cleave Ternes: Treating Alisea Matte/Extender: Oren Section in Treatment: 5 HBO Treatment Course Details Treatment Course Number: 1 Ordering Demico Ploch: Bernerd Pho Treatments Ordered: otal 40 HBO Treatment Start Date: 01/27/2021 HBO Indication: Diabetic Ulcer(s) of the Lower Extremity HBO Treatment Details Treatment Number: 20 Patient Type: Outpatient Chamber Type: Monoplace Chamber Serial #: [id number] Treatment Protocol: 2.5 ATA with 90 minutes oxygen, with two 5 minute air breaks Treatment Details Compression Rate Down: 2.5 psi / minute De-Compression Rate Up: 2.5 psi / minute A breaks and breathing ir Compress Tx Pressure periods Decompress Decompress Begins Reached (leave unused spaces Begins Ends blank) Chamber Pressure (ATA 1 2.5 2.5 2.5 2.5 2.5 - - 2.5 1 ) Clock Time (24 hr) 08:15 08:27 08:57 09:02 09:32 09:37 - - 10:07 10:16 Treatment Length: 121 (minutes) Treatment Segments: 4 Vital Signs Capillary Blood Glucose Reference Range: 80 - 120 mg / dl HBO Diabetic Blood Glucose Intervention Range: <131 mg/dl or >249 mg/dl Type: Time Vitals Blood Pulse: Respiratory Capillary Blood Glucose Pulse Action Temperature: Taken: Pressure: Rate: Glucose (mg/dl): Meter #: Oximetry (%) Taken: Pre 07:58 141/60 58 18 97.7 168 99 Post 10:19 128/65 51 18 97.6 122 discharge per protocol Treatment Response Treatment Toleration: Well Treatment Completion Status: Treatment Completed without Adverse Event Additional Procedure Documentation Tissue Sevierity: Limited to breakdown of skin Physician HBO Attestation: I  certify that I supervised this HBO treatment in accordance with Medicare guidelines. A trained emergency response team is readily available per Yes hospital policies and procedures. Continue HBOT as ordered. Yes Electronic Signature(s) Signed: 02/28/2021 12:25:23 PM By: Kalman Shan DO Entered By: Kalman Shan on 02/28/2021 12:24:09 -------------------------------------------------------------------------------- HBO Safety Checklist Details Patient Name: Date of Service: [name] 02/28/2021 8:00 A M Medical Record Number: [id number] Patient Account Number: [id number] Date of Birth/Sex: Treating RN: 01-06-1951 (70 y.o. M) Primary Care Kayly Kriegel: [name] Other Clinician: Donavan Burnet Referring Pedram Goodchild: Treating Sanav Remer/Extender: Oren Section in Treatment: 5 HBO Safety Checklist Items Safety Checklist Consent Form Signed Patient voided / foley secured and emptied When did you last eato 0700 Last dose of injectable or oral agent metformin after yesterday's HBO Tx Ostomy pouch emptied and vented if applicable NA All implantable devices assessed, documented and approved NA Intravenous access site secured and place NA Valuables secured Linens and cotton and cotton/polyester blend (less than 51% polyester) Personal oil-based products / skin lotions / body lotions removed Wigs or hairpieces removed NA Smoking or tobacco materials removed NA Books / newspapers / magazines / loose paper removed Cologne, aftershave, perfume and deodorant removed Jewelry removed (may wrap wedding band) Make-up removed NA Hair care products removed NA Battery operated devices (external) removed Heating patches and chemical warmers removed Titanium eyewear removed Nail polish cured greater than 10 hours NA Casting material cured greater than 10 hours NA Hearing aids removed NA Loose dentures or partials removed NA Prosthetics have been  removed NA Patient demonstrates correct use of air break device (if applicable) Patient concerns have been addressed Patient grounding bracelet on and cord attached to chamber Specifics for Inpatients (complete in addition to above) Medication sheet sent with patient NA Intravenous medications needed or due during therapy sent with patient  NA Drainage tubes (e.g. nasogastric tube or chest tube secured and vented) NA Endotracheal or Tracheotomy tube secured NA Cuff deflated of air and inflated with saline NA Airway suctioned NA Electronic Signature(s) Signed: 03/03/2021 9:53:06 AM By: Donavan Burnet EMT Entered By: Donavan Burnet on 02/28/2021 08:13:47

## 2021-03-03 NOTE — Progress Notes (Signed)
[name] [name], [name] [name] ([id number]) Visit Report for 02/28/2021 SuperBill Details Patient Name: Date of Service: [name] [name], [name] [name] 02/28/2021 Medical Record Number: [id number] Patient Account Number: [id number] Date of Birth/Sex: Treating RN: 07-17-1950 (70 y.o. M) Primary Care Provider: [name] Other Clinician: Donavan Burnet Referring Provider: Treating Provider/Extender: Oren Section in Treatment: 5 Diagnosis Coding ICD-10 Codes Code Description [phone] Type 2 diabetes mellitus with foot ulcer L97.518 Non-pressure chronic ulcer of other part of right foot with other specified severity L97.511 Non-pressure chronic ulcer of other part of right foot limited to breakdown of skin M86.371 Chronic multifocal osteomyelitis, right ankle and foot E11.621 Type 2 diabetes mellitus with foot ulcer Facility Procedures CPT4 Code Description Modifier Quantity [id number]-(Facility Use Only) HBOT full body chamber, 25mn , 4 ICD-10 Diagnosis Description E11.621 Type 2 diabetes mellitus with foot ulcer L97.518 Non-pressure chronic ulcer of other part of right foot with other specified severity L97.511 Non-pressure chronic ulcer of other part of right foot limited to breakdown of skin M86.371 Chronic multifocal osteomyelitis, right ankle and foot Physician Procedures Quantity CPT4 Code Description Modifier [id number] PHYS HYPERBARIC OXYGEN THERAPY 1 ICD-10 Diagnosis Description E11.621 Type 2 diabetes mellitus with foot ulcer L97.518 Non-pressure chronic ulcer of other part of right foot with other specified severity L97.511 Non-pressure chronic ulcer of other part of right foot limited to breakdown of skin M86.371 Chronic multifocal osteomyelitis, right ankle and foot Electronic Signature(s) Signed: 02/28/2021 12:25:23 PM By: HKalman ShanDO Signed: 03/03/2021 9:53:06 AM By: SDonavan BurnetEMT Entered By: SDonavan Burneton 02/28/2021  [id number]

## 2021-03-03 NOTE — Progress Notes (Signed)
[name], [name] ([id number]) Visit Report for 03/03/2021 HBO Details Patient Name: Date of Service: [name] [name], [name] [name] 03/03/2021 8:00 A M Medical Record Number: [id number] Patient Account Number: [id number] Date of Birth/Sex: Treating RN: 07-Aug-1950 (70 y.o. [name] Primary Care Lynette Topete: [name] Other Clinician: Donavan Burnet Referring Zakirah Weingart: Treating Fontella Shan/Extender: Oren Section in Treatment: 5 HBO Treatment Course Details Treatment Course Number: 1 Ordering Jerimy Johanson: Bernerd Pho Treatments Ordered: otal 40 HBO Treatment Start Date: 01/27/2021 HBO Indication: Diabetic Ulcer(s) of the Lower Extremity HBO Treatment Details Treatment Number: 21 Patient Type: Outpatient Chamber Type: Monoplace Chamber Serial #: [id number] Treatment Protocol: 2.5 ATA with 90 minutes oxygen, with two 5 minute air breaks Treatment Details Compression Rate Down: 2.0 psi / minute De-Compression Rate Up: 2.0 psi / minute A breaks and breathing ir Compress Tx Pressure periods Decompress Decompress Begins Reached (leave unused spaces Begins Ends blank) Chamber Pressure (ATA 1 2.5 2.5 2.5 2.5 2.5 - - 2.5 1 ) Clock Time (24 hr) 08:12 08:25 08:55 09:00 09:30 09:35 - - 10:05 10:17 Treatment Length: 125 (minutes) Treatment Segments: 4 Vital Signs Capillary Blood Glucose Reference Range: 80 - 120 mg / dl HBO Diabetic Blood Glucose Intervention Range: <131 mg/dl or >249 mg/dl Time Vitals Blood Respiratory Capillary Blood Glucose Pulse Action Type: Pulse: Temperature: Taken: Pressure: Rate: Glucose (mg/dl): Meter #: Oximetry (%) Taken: Pre 07:53 129/82 58 18 98 174 Post 10:21 143/66 48 18 97.7 145 100 Treatment Response Treatment Toleration: Well Treatment Completion Status: Treatment Completed without Adverse Event Additional Procedure Documentation Tissue Sevierity: Limited to breakdown of skin Physician HBO Attestation: I certify that I  supervised this HBO treatment in accordance with Medicare guidelines. A trained emergency response team is readily available per Yes hospital policies and procedures. Continue HBOT as ordered. Yes Electronic Signature(s) Signed: 03/03/2021 4:32:40 PM By: Kalman Shan DO Previous Signature: 03/03/2021 1:38:27 PM Version By: Donavan Burnet EMT Entered By: Kalman Shan on 03/03/2021 16:32:40 -------------------------------------------------------------------------------- HBO Safety Checklist Details Patient Name: Date of Service: [name] [name] B. 03/03/2021 8:00 A M Medical Record Number: [id number] Patient Account Number: [id number] Date of Birth/Sex: Treating RN: August 02, 1950 (70 y.o. [name] Primary Care Aimar Borghi: [name] Other Clinician: Donavan Burnet Referring Ivey Nembhard: Treating Hulet Ehrmann/Extender: Oren Section in Treatment: 5 HBO Safety Checklist Items Safety Checklist Consent Form Signed Patient voided / foley secured and emptied When did you last eato 0700 Last dose of injectable or oral agent Metformin yesterday Ostomy pouch emptied and vented if applicable NA All implantable devices assessed, documented and approved NA Intravenous access site secured and place NA Valuables secured Linens and cotton and cotton/polyester blend (less than 51% polyester) Personal oil-based products / skin lotions / body lotions removed Wigs or hairpieces removed NA Smoking or tobacco materials removed NA Books / newspapers / magazines / loose paper removed Cologne, aftershave, perfume and deodorant removed Jewelry removed (may wrap wedding band) Make-up removed NA Hair care products removed Battery operated devices (external) removed Heating patches and chemical warmers removed Titanium eyewear removed Nail polish cured greater than 10 hours NA Casting material cured greater than 10 hours NA Hearing aids removed NA Loose  dentures or partials removed NA Prosthetics have been removed NA Patient demonstrates correct use of air break device (if applicable) Patient concerns have been addressed Patient grounding bracelet on and cord attached to chamber Specifics for Inpatients (complete in addition to above) Medication sheet sent with patient NA Intravenous medications needed  or due during therapy sent with patient NA Drainage tubes (e.g. nasogastric tube or chest tube secured and vented) NA Endotracheal or Tracheotomy tube secured NA Cuff deflated of air and inflated with saline NA Airway suctioned NA Electronic Signature(s) Signed: 03/03/2021 1:46:39 PM By: Donavan Burnet EMT Entered By: Donavan Burnet on 03/03/2021 08:23:31

## 2021-03-03 NOTE — Progress Notes (Signed)
AHKING, PRETZER ([id number]) Visit Report for 02/28/2021 Arrival Information Details Patient Name: Date of Service: [name] [name], [name] [name] 02/28/2021 8:00 A M Medical Record Number: [id number] Patient Account Number: [id number] Date of Birth/Sex: Treating RN: Nov 17, 1950 (70 y.o. M) Primary Care Vaughn Beaumier: [name] Other Clinician: Donavan Burnet Referring Benedicta Sultan: Treating Estela Vinal/Extender: Oren Section in Treatment: 5 Visit Information History Since Last Visit All ordered tests and consults were completed: Yes Patient Arrived: Ambulatory Added or deleted any medications: No Arrival Time: 07:35 Any new allergies or adverse reactions: No Accompanied By: self Had a fall or experienced change in No Transfer Assistance: None activities of daily living that may affect Patient Identification Verified: Yes risk of falls: Secondary Verification Process Completed: Yes Signs or symptoms of abuse/neglect since last visito No Patient Requires Transmission-Based Precautions: No Hospitalized since last visit: No Patient Has Alerts: Yes Implantable device outside of the clinic excluding No cellular tissue based products placed in the center since last visit: Pain Present Now: No Electronic Signature(s) Signed: 03/03/2021 9:53:06 AM By: Donavan Burnet EMT Entered By: Donavan Burnet on 02/28/2021 08:08:52 -------------------------------------------------------------------------------- Encounter Discharge Information Details Patient Name: Date of Service: [name] Hawthorne B. 02/28/2021 8:00 A M Medical Record Number: [id number] Patient Account Number: [id number] Date of Birth/Sex: Treating RN: Jan 12, 1951 (70 y.o. M) Primary Care Theron Cumbie: [name] Other Clinician: Donavan Burnet Referring Darean Rote: Treating Brandley Aldrete/Extender: Oren Section in Treatment: 5 Encounter Discharge Information Items Discharge Condition:  Stable Ambulatory Status: Ambulatory Discharge Destination: Other (Note Required) Transportation: Private Auto Schedule Follow-up Appointment: No Clinical Summary of Care: Notes Patient goes to work after HBO Tx Electronic Signature(s) Signed: 03/03/2021 9:53:06 AM By: Donavan Burnet EMT Entered By: Donavan Burnet on 02/28/2021 10:37:06 -------------------------------------------------------------------------------- Vitals Details Patient Name: Date of Service: [name] Hawthorne B. 02/28/2021 8:00 A M Medical Record Number: [id number] Patient Account Number: [id number] Date of Birth/Sex: Treating RN: 28-Nov-1950 (70 y.o. M) Primary Care Danamarie Minami: [name] Other Clinician: Donavan Burnet Referring Dmiya Malphrus: Treating Katrinna Travieso/Extender: Oren Section in Treatment: 5 Vital Signs Time Taken: 07:58 Temperature (F): 97.7 Height (in): 73 Pulse (bpm): 58 Weight (lbs): 222 Respiratory Rate (breaths/min): 18 Body Mass Index (BMI): 29.3 Blood Pressure (mmHg): 141/60 Reference Range: 80 - 120 mg / dl Airway Pulse Oximetry (%): 99 Electronic Signature(s) Signed: 03/03/2021 9:53:06 AM By: Donavan Burnet EMT Entered By: Donavan Burnet on 02/28/2021 08:10:18

## 2021-03-03 NOTE — Progress Notes (Signed)
[name], [name] ([id number]) Visit Report for 02/26/2021 HBO Details Patient Name: Date of Service: [name] [name], [name] [name] 02/26/2021 8:00 A M Medical Record Number: [id number] Patient Account Number: [id number] Date of Birth/Sex: Treating RN: 09-03-1950 (70 y.o. M) Primary Care Arvind Mexicano: [name] Other Clinician: Donavan Burnet Referring Xzaria Teo: Treating Najmo Pardue/Extender: Tally Joe in Treatment: 5 HBO Treatment Course Details Treatment Course Number: 1 Ordering Venecia Mehl: Bernerd Pho Treatments Ordered: otal 40 HBO Treatment Start Date: 01/27/2021 HBO Indication: Diabetic Ulcer(s) of the Lower Extremity HBO Treatment Details Treatment Number: 18 Patient Type: Outpatient Chamber Type: Monoplace Chamber Serial #: [id number] Treatment Protocol: 2.5 ATA with 90 minutes oxygen, with two 5 minute air breaks Treatment Details Compression Rate Down: 2.5 psi / minute De-Compression Rate Up: 2.5 psi / minute A breaks and breathing ir Compress Tx Pressure periods Decompress Decompress Begins Reached (leave unused spaces Begins Ends blank) Chamber Pressure (ATA 1 2.5 2.5 2.5 2.5 2.5 - - 2.5 1 ) Clock Time (24 hr) 08:04 08:15 08:46 08:51 09:21 09:26 - - 09:55 10:04 Treatment Length: 120 (minutes) Treatment Segments: 4 Vital Signs Capillary Blood Glucose Reference Range: 80 - 120 mg / dl HBO Diabetic Blood Glucose Intervention Range: <131 mg/dl or >249 mg/dl Time Vitals Blood Respiratory Capillary Blood Glucose Pulse Action Type: Pulse: Temperature: Taken: Pressure: Rate: Glucose (mg/dl): Meter #: Oximetry (%) Taken: Pre 07:46 124/59 59 18 98 164 95 Post 10:07 138/65 50 18 97.5 144 Treatment Response Treatment Toleration: Well Treatment Completion Status: Treatment Completed without Adverse Event Additional Procedure Documentation Tissue Sevierity: Limited to breakdown of skin Physician HBO Attestation: I certify that I supervised this  HBO treatment in accordance with Medicare guidelines. A trained emergency response team is readily available per Yes hospital policies and procedures. Continue HBOT as ordered. Yes Electronic Signature(s) Signed: 02/26/2021 4:50:03 PM By: Worthy Keeler PA-C Entered By: Worthy Keeler on 02/26/2021 16:50:02 -------------------------------------------------------------------------------- HBO Safety Checklist Details Patient Name: Date of Service: [name], [name] 02/26/2021 8:00 A M Medical Record Number: [id number] Patient Account Number: [id number] Date of Birth/Sex: Treating RN: [birth year] (70 y.o. M) Primary Care Ishaaq Penna: [name] Other Clinician: Donavan Burnet Referring Jag Lenz: Treating Darryel Diodato/Extender: Tally Joe in Treatment: 5 HBO Safety Checklist Items Safety Checklist Consent Form Signed Patient voided / foley secured and emptied When did you last eato 0700 Last dose of injectable or oral agent Yesterday after HBO Tx Ostomy pouch emptied and vented if applicable NA All implantable devices assessed, documented and approved NA Intravenous access site secured and place NA Valuables secured Linens and cotton and cotton/polyester blend (less than 51% polyester) Personal oil-based products / skin lotions / body lotions removed Wigs or hairpieces removed NA Smoking or tobacco materials removed NA Books / newspapers / magazines / loose paper removed Cologne, aftershave, perfume and deodorant removed Jewelry removed (may wrap wedding band) Make-up removed NA Hair care products removed NA Battery operated devices (external) removed Heating patches and chemical warmers removed Titanium eyewear removed Nail polish cured greater than 10 hours NA Casting material cured greater than 10 hours NA Hearing aids removed NA Loose dentures or partials removed NA Prosthetics have been removed NA Patient demonstrates correct use  of air break device (if applicable) Patient concerns have been addressed Patient grounding bracelet on and cord attached to chamber Specifics for Inpatients (complete in addition to above) Medication sheet sent with patient NA Intravenous medications needed or due during therapy sent with patient  NA Drainage tubes (e.g. nasogastric tube or chest tube secured and vented) NA Endotracheal or Tracheotomy tube secured NA Cuff deflated of air and inflated with saline NA Airway suctioned NA Electronic Signature(s) Signed: 03/03/2021 9:53:06 AM By: Donavan Burnet EMT Entered By: Donavan Burnet on 02/26/2021 08:26:09

## 2021-03-03 NOTE — Progress Notes (Signed)
[name] [name] ([id number]) Visit Report for 02/26/2021 Arrival Information Details Patient Name: Date of Service: [name] [name], [name] [name] 02/26/2021 8:00 A M Medical Record Number: [id number] Patient Account Number: [id number] Date of Birth/Sex: Treating RN: 02-11-1951 (70 y.o. M) Primary Care Sherrel Ploch: [name] Other Clinician: Donavan Burnet Referring Sahian Kerney: Treating Sahid Borba/Extender: Tally Joe in Treatment: 5 Visit Information History Since Last Visit All ordered tests and consults were completed: Yes Patient Arrived: Ambulatory Added or deleted any medications: No Arrival Time: 07:30 Any new allergies or adverse reactions: No Accompanied By: self Had a fall or experienced change in No Transfer Assistance: None activities of daily living that may affect Patient Identification Verified: Yes risk of falls: Secondary Verification Process Completed: Yes Signs or symptoms of abuse/neglect since last visito No Patient Requires Transmission-Based Precautions: No Hospitalized since last visit: No Patient Has Alerts: Yes Implantable device outside of the clinic excluding No Patient Alerts: R ABI non compressible cellular tissue based products placed in the center L ABI non compressible since last visit: Has Dressing in Place as Prescribed: Yes Has Footwear/Offloading in Place as Prescribed: Yes Left: Wedge Shoe Pain Present Now: No Electronic Signature(s) Signed: 03/03/2021 9:53:06 AM By: Donavan Burnet EMT Entered By: Donavan Burnet on 02/26/2021 08:29:49 -------------------------------------------------------------------------------- Encounter Discharge Information Details Patient Name: Date of Service: [name] Hawthorne B. 02/26/2021 8:00 A M Medical Record Number: [id number] Patient Account Number: [id number] Date of Birth/Sex: Treating RN: 1950/11/18 (70 y.o. M) Primary Care [name] Silvernail: [name] Other Clinician: Donavan Burnet Referring Brentyn Seehafer: Treating Anjana Cheek/Extender: Tally Joe in Treatment: 5 Encounter Discharge Information Items Discharge Condition: Stable Ambulatory Status: Ambulatory Discharge Destination: Other (Note Required) Transportation: Private Auto Accompanied By: self Schedule Follow-up Appointment: No Clinical Summary of Care: Notes Patient goes to work after treatment. Electronic Signature(s) Signed: 03/03/2021 9:53:06 AM By: Donavan Burnet EMT Entered By: Donavan Burnet on 02/26/2021 10:35:32 -------------------------------------------------------------------------------- Vitals Details Patient Name: Date of Service: [name] Hawthorne B. 02/26/2021 8:00 A M Medical Record Number: [id number] Patient Account Number: [id number] Date of Birth/Sex: Treating RN: Apr 01, 1951 (70 y.o. M) Primary Care Mica Ramdass: [name] Other Clinician: Donavan Burnet Referring [name] [name]: Treating Barbarita Hutmacher/Extender: Tally Joe in Treatment: 5 Vital Signs Time Taken: 07:46 Temperature (F): 98 Height (in): 73 Pulse (bpm): 59 Weight (lbs): 222 Respiratory Rate (breaths/min): 18 Body Mass Index (BMI): 29.3 Blood Pressure (mmHg): 124/59 Capillary Blood Glucose (mg/dl): 164 Reference Range: 80 - 120 mg / dl Airway Pulse Oximetry (%): 95 Electronic Signature(s) Signed: 03/03/2021 9:53:06 AM By: Donavan Burnet EMT Entered By: Donavan Burnet on 02/26/2021 08:24:57

## 2021-03-03 NOTE — Progress Notes (Signed)
[name] [name], [name] [name] ([id number]) Visit Report for 03/03/2021 Arrival Information Details Patient Name: Date of Service: [name] [name], [name] [name] 03/03/2021 8:00 A M Medical Record Number: [id number] Patient Account Number: [id number] Date of Birth/Sex: Treating RN: Oct 19, 1950 (70 y.o. [name] Primary Care Lisa-Marie Rueger: [name] Other Clinician: Donavan Burnet Referring Yemariam Magar: Treating Braxdon Gappa/Extender: Oren Section in Treatment: 5 Visit Information History Since Last Visit All ordered tests and consults were completed: Yes Patient Arrived: Ambulatory Added or deleted any medications: No Arrival Time: 07:35 Any new allergies or adverse reactions: No Accompanied By: self Had a fall or experienced change in No Transfer Assistance: None activities of daily living that may affect Patient Identification Verified: Yes risk of falls: Secondary Verification Process Completed: Yes Signs or symptoms of abuse/neglect since last visito No Patient Requires Transmission-Based Precautions: No Hospitalized since last visit: No Patient Has Alerts: Yes Pain Present Now: No Electronic Signature(s) Signed: 03/03/2021 1:46:39 PM By: Donavan Burnet EMT Entered By: Donavan Burnet on 03/03/2021 07:53:33 -------------------------------------------------------------------------------- Encounter Discharge Information Details Patient Name: Date of Service: [name] Hawthorne B. 03/03/2021 8:00 A M Medical Record Number: [id number] Patient Account Number: [id number] Date of Birth/Sex: Treating RN: Nov 30, 1950 (70 y.o. [name] Primary Care Leighana Neyman: [name] Other Clinician: Donavan Burnet Referring Hideo Googe: Treating Marty Sadlowski/Extender: Oren Section in Treatment: 5 Encounter Discharge Information Items Discharge Condition: Stable Ambulatory Status: Ambulatory Discharge Destination: Other (Note Required) Transportation:  Private Auto Accompanied By: self Schedule Follow-up Appointment: No Clinical Summary of Care: Notes patient goes to work after HBO Tx Electronic Signature(s) Signed: 03/03/2021 1:46:39 PM By: Donavan Burnet EMT Entered By: Donavan Burnet on 03/03/2021 10:29:12 -------------------------------------------------------------------------------- [place] Details Patient Name: Date of Service: [name] Hawthorne B. 03/03/2021 8:00 A M Medical Record Number: [id number] Patient Account Number: [id number] Date of Birth/Sex: Treating RN: [birth year] (70 y.o. [name] Primary Care Letizia Hook: [name] Other Clinician: Donavan Burnet Referring Natiya Seelinger: Treating Tashawn Laswell/Extender: Oren Section in Treatment: 5 Vital Signs Time Taken: 07:53 Temperature (F): 98.0 Height (in): 73 Pulse (bpm): 58 Weight (lbs): 222 Respiratory Rate (breaths/min): 18 Body Mass Index (BMI): 29.3 Blood Pressure (mmHg): 129/82 Capillary Blood Glucose (mg/dl): 174 Reference Range: 80 - 120 mg / dl Electronic Signature(s) Signed: 03/03/2021 1:46:39 PM By: Donavan Burnet EMT Entered By: Donavan Burnet on 03/03/2021 [id number]

## 2021-03-03 NOTE — Progress Notes (Signed)
WINTON, PASION ([id number]) Visit Report for 02/27/2021 HBO Details Patient Name: Date of Service: [name] [name], [name] [name] 02/27/2021 8:00 A M Medical Record Number: [id number] Patient Account Number: [id number] Date of Birth/Sex: Treating RN: 08/07/1950 (70 y.o. [name], [name] Primary Care Parul Porcelli: [name] Other Clinician: Donavan Burnet Referring Loomis Anacker: Treating Liev Brockbank/Extender: Lanier Clam in Treatment: 5 HBO Treatment Course Details Treatment Course Number: 1 Ordering Estle Huguley: Bernerd Pho Treatments Ordered: otal 40 HBO Treatment Start Date: 01/27/2021 HBO Indication: Diabetic Ulcer(s) of the Lower Extremity HBO Treatment Details Treatment Number: 19 Patient Type: Outpatient Chamber Type: Monoplace Chamber Serial #: [id number] Treatment Protocol: 2.5 ATA with 90 minutes oxygen, with two 5 minute air breaks Treatment Details Compression Rate Down: 2.5 psi / minute De-Compression Rate Up: A breaks and breathing ir Compress Tx Pressure periods Decompress Decompress Begins Reached (leave unused spaces Begins Ends blank) Chamber Pressure (ATA 1 2.5 2.5 2.5 2.5 2.5 - - 2.5 1 ) Clock Time (24 hr) 08:00 08:10 08:40 08:45 09:15 09:20 - - 09:50 10:00 Treatment Length: 120 (minutes) Treatment Segments: 4 Vital Signs Capillary Blood Glucose Reference Range: 80 - 120 mg / dl HBO Diabetic Blood Glucose Intervention Range: <131 mg/dl or >249 mg/dl Time Vitals Blood Respiratory Capillary Blood Glucose Pulse Action Type: Pulse: Temperature: Taken: Pressure: Rate: Glucose (mg/dl): Meter #: Oximetry (%) Taken: Pre 07:46 128/95 59 18 97.8 188 98 Post 10:04 145/74 49 18 97.8 153 Treatment Response Treatment Toleration: Well Treatment Completion Status: Treatment Completed without Adverse Event Additional Procedure Documentation Tissue Sevierity: Limited to breakdown of skin Traven Davids Notes No concerns with treatment given Physician HBO  Attestation: I certify that I supervised this HBO treatment in accordance with Medicare guidelines. A trained emergency response team is readily available per Yes hospital policies and procedures. Continue HBOT as ordered. Yes Electronic Signature(s) Signed: 02/27/2021 4:52:40 PM By: Linton Ham MD Previous Signature: 02/27/2021 11:13:26 AM Version By: Donavan Burnet EMT Entered By: Linton Ham on 02/27/2021 15:12:05 -------------------------------------------------------------------------------- HBO Safety Checklist Details Patient Name: Date of Service: [name] 02/27/2021 8:00 A M Medical Record Number: [id number] Patient Account Number: [id number] Date of Birth/Sex: Treating RN: 05-Oct-1950 (70 y.o. [name], [name] Primary Care Tadeo Besecker: [name] Other Clinician: Donavan Burnet Referring Oseas Detty: Treating Forbes Loll/Extender: Lanier Clam in Treatment: 5 HBO Safety Checklist Items Safety Checklist Consent Form Signed Patient voided / foley secured and emptied When did you last eato 0700 Last dose of injectable or oral agent metformin after yesterday's HBO Tx Ostomy pouch emptied and vented if applicable NA All implantable devices assessed, documented and approved NA Intravenous access site secured and place NA Valuables secured Linens and cotton and cotton/polyester blend (less than 51% polyester) Personal oil-based products / skin lotions / body lotions removed Wigs or hairpieces removed NA Smoking or tobacco materials removed NA Books / newspapers / magazines / loose paper removed Cologne, aftershave, perfume and deodorant removed Jewelry removed (may wrap wedding band) Make-up removed NA Hair care products removed NA Battery operated devices (external) removed Heating patches and chemical warmers removed Titanium eyewear removed Nail polish cured greater than 10 hours NA Casting material cured greater than  10 hours NA Hearing aids removed NA Loose dentures or partials removed NA Prosthetics have been removed NA Patient demonstrates correct use of air break device (if applicable) Patient concerns have been addressed Patient grounding bracelet on and cord attached to chamber Specifics for Inpatients (complete in addition to above) Medication sheet  sent with patient NA Intravenous medications needed or due during therapy sent with patient NA Drainage tubes (e.g. nasogastric tube or chest tube secured and vented) NA Endotracheal or Tracheotomy tube secured NA Cuff deflated of air and inflated with saline NA Airway suctioned NA Electronic Signature(s) Signed: 03/03/2021 9:53:06 AM By: Donavan Burnet EMT Entered By: Donavan Burnet on 02/27/2021 08:32:46

## 2021-03-03 NOTE — Progress Notes (Signed)
SYLVESTA, KOTTMAN ([id number]) Visit Report for 03/03/2021 [facility] Details Patient Name: Date of Service: [name] [name], [name] [name] 03/03/2021 Medical Record Number: [id number] Patient Account Number: [id number] Date of Birth/Sex: Treating RN: [birth year] (70 y.o. [name] Primary Care Provider: [name] Other Clinician: Donavan Burnet Referring Provider: Treating Provider/Extender: Oren Section in Treatment: 5 Diagnosis Coding ICD-10 Codes Code Description [phone] Type 2 diabetes mellitus with foot ulcer L97.518 Non-pressure chronic ulcer of other part of right foot with other specified severity L97.511 Non-pressure chronic ulcer of other part of right foot limited to breakdown of skin M86.371 Chronic multifocal osteomyelitis, right ankle and foot E11.621 Type 2 diabetes mellitus with foot ulcer Facility Procedures CPT4 Code Description Modifier Quantity [id number]-(Facility Use Only) HBOT full body chamber, 60mn , 4 ICD-10 Diagnosis Description E11.621 Type 2 diabetes mellitus with foot ulcer L97.518 Non-pressure chronic ulcer of other part of right foot with other specified severity L97.511 Non-pressure chronic ulcer of other part of right foot limited to breakdown of skin M86.371 Chronic multifocal osteomyelitis, right ankle and foot Physician Procedures Quantity CPT4 Code Description Modifier [id number] PHYS HYPERBARIC OXYGEN THERAPY 1 ICD-10 Diagnosis Description E11.621 Type 2 diabetes mellitus with foot ulcer L97.518 Non-pressure chronic ulcer of other part of right foot with other specified severity L97.511 Non-pressure chronic ulcer of other part of right foot limited to breakdown of skin M86.371 Chronic multifocal osteomyelitis, right ankle and foot Electronic Signature(s) Signed: 03/03/2021 1:46:39 PM By: SDonavan BurnetEMT Signed: 03/03/2021 4:34:27 PM By: HKalman ShanDO Entered By: SDonavan Burneton  03/03/2021 10:28:31

## 2021-03-03 NOTE — Progress Notes (Signed)
AAQIL, MASSUCCI ([id number]) Visit Report for 02/27/2021 Arrival Information Details Patient Name: Date of Service: [name] [name], [name] [name] 02/27/2021 8:00 A M Medical Record Number: [id number] Patient Account Number: [id number] Date of Birth/Sex: Treating RN: 1951/03/12 (70 y.o. [name], [name] Primary Care Anaysha Andre: [name] Other Clinician: Donavan Burnet Referring Taichi Repka: Treating Coen Miyasato/Extender: Lanier Clam in Treatment: 5 Visit Information History Since Last Visit All ordered tests and consults were completed: Yes Patient Arrived: Ambulatory Added or deleted any medications: No Arrival Time: 07:30 Any new allergies or adverse reactions: No Accompanied By: self Had a fall or experienced change in No Transfer Assistance: None activities of daily living that may affect Patient Identification Verified: Yes risk of falls: Secondary Verification Process Completed: Yes Signs or symptoms of abuse/neglect since last visito No Patient Requires Transmission-Based Precautions: No Hospitalized since last visit: No Patient Has Alerts: Yes Implantable device outside of the clinic excluding No Patient Alerts: R ABI non compressible cellular tissue based products placed in the center L ABI non compressible since last visit: Pain Present Now: No Electronic Signature(s) Signed: 03/03/2021 9:53:06 AM By: Donavan Burnet EMT Entered By: Donavan Burnet on 02/27/2021 08:27:30 -------------------------------------------------------------------------------- Encounter Discharge Information Details Patient Name: Date of Service: [name] Hawthorne B. 02/27/2021 8:00 A M Medical Record Number: [id number] Patient Account Number: [id number] Date of Birth/Sex: Treating RN: 08/04/1950 (70 y.o. Hessie Diener Primary Care Hannahmarie Asberry: [name] Other Clinician: Donavan Burnet Referring Katalyn Matin: Treating Dai Mcadams/Extender: Lanier Clam in Treatment: 5 Encounter Discharge Information Items Discharge Condition: Stable Ambulatory Status: Ambulatory Discharge Destination: Other (Note Required) Transportation: Other Accompanied By: self Schedule Follow-up Appointment: No Clinical Summary of Care: Notes Patient discharged from HBO to Wound Care Encounter in Clinic. Electronic Signature(s) Signed: 02/27/2021 11:19:10 AM By: Donavan Burnet EMT Entered By: Donavan Burnet on 02/27/2021 11:19:10 -------------------------------------------------------------------------------- Vitals Details Patient Name: Date of Service: [name] Hawthorne B. 02/27/2021 8:00 A M Medical Record Number: [id number] Patient Account Number: [id number] Date of Birth/Sex: Treating RN: 08/02/1950 (70 y.o. [name], [name] Primary Care Shamere Dilworth: [name] Other Clinician: Donavan Burnet Referring Jaylanie Boschee: Treating Aws Shere/Extender: Lanier Clam in Treatment: 5 Vital Signs Time Taken: 07:46 Temperature (F): 97.8 Height (in): 73 Pulse (bpm): 59 Weight (lbs): 222 Respiratory Rate (breaths/min): 18 Body Mass Index (BMI): 29.3 Blood Pressure (mmHg): 128/95 Capillary Blood Glucose (mg/dl): 188 Reference Range: 80 - 120 mg / dl Airway Pulse Oximetry (%): 98 Electronic Signature(s) Signed: 03/03/2021 9:53:06 AM By: Donavan Burnet EMT Entered By: Donavan Burnet on 02/27/2021 08:31:26

## 2021-03-04 ENCOUNTER — Encounter (HOSPITAL_BASED_OUTPATIENT_CLINIC_OR_DEPARTMENT_OTHER): Payer: Medicare HMO | Admitting: Internal Medicine

## 2021-03-04 DIAGNOSIS — L97511 Non-pressure chronic ulcer of other part of right foot limited to breakdown of skin: Secondary | ICD-10-CM | POA: Diagnosis not present

## 2021-03-04 DIAGNOSIS — E1169 Type 2 diabetes mellitus with other specified complication: Secondary | ICD-10-CM | POA: Diagnosis not present

## 2021-03-04 DIAGNOSIS — E1122 Type 2 diabetes mellitus with diabetic chronic kidney disease: Secondary | ICD-10-CM | POA: Diagnosis not present

## 2021-03-04 DIAGNOSIS — I129 Hypertensive chronic kidney disease with stage 1 through stage 4 chronic kidney disease, or unspecified chronic kidney disease: Secondary | ICD-10-CM | POA: Diagnosis not present

## 2021-03-04 DIAGNOSIS — Z8589 Personal history of malignant neoplasm of other organs and systems: Secondary | ICD-10-CM | POA: Diagnosis not present

## 2021-03-04 DIAGNOSIS — L97518 Non-pressure chronic ulcer of other part of right foot with other specified severity: Secondary | ICD-10-CM | POA: Diagnosis not present

## 2021-03-04 DIAGNOSIS — E1151 Type 2 diabetes mellitus with diabetic peripheral angiopathy without gangrene: Secondary | ICD-10-CM | POA: Diagnosis not present

## 2021-03-04 DIAGNOSIS — G4733 Obstructive sleep apnea (adult) (pediatric): Secondary | ICD-10-CM | POA: Diagnosis not present

## 2021-03-04 DIAGNOSIS — M86371 Chronic multifocal osteomyelitis, right ankle and foot: Secondary | ICD-10-CM | POA: Diagnosis not present

## 2021-03-04 DIAGNOSIS — Z7984 Long term (current) use of oral hypoglycemic drugs: Secondary | ICD-10-CM | POA: Diagnosis not present

## 2021-03-04 DIAGNOSIS — E1140 Type 2 diabetes mellitus with diabetic neuropathy, unspecified: Secondary | ICD-10-CM | POA: Diagnosis not present

## 2021-03-04 DIAGNOSIS — E11621 Type 2 diabetes mellitus with foot ulcer: Secondary | ICD-10-CM | POA: Diagnosis not present

## 2021-03-04 DIAGNOSIS — I252 Old myocardial infarction: Secondary | ICD-10-CM | POA: Diagnosis not present

## 2021-03-04 DIAGNOSIS — I2510 Atherosclerotic heart disease of native coronary artery without angina pectoris: Secondary | ICD-10-CM | POA: Diagnosis not present

## 2021-03-04 DIAGNOSIS — N1831 Chronic kidney disease, stage 3a: Secondary | ICD-10-CM | POA: Diagnosis not present

## 2021-03-04 LAB — GLUCOSE, CAPILLARY
Glucose-Capillary: 188 mg/dL — ABNORMAL HIGH (ref 70–99)
Glucose-Capillary: 147 mg/dL — ABNORMAL HIGH (ref 70–99)

## 2021-03-05 ENCOUNTER — Encounter (HOSPITAL_BASED_OUTPATIENT_CLINIC_OR_DEPARTMENT_OTHER): Payer: Medicare HMO | Admitting: Physician Assistant

## 2021-03-05 ENCOUNTER — Other Ambulatory Visit: Payer: Self-pay

## 2021-03-05 DIAGNOSIS — I252 Old myocardial infarction: Secondary | ICD-10-CM | POA: Diagnosis not present

## 2021-03-05 DIAGNOSIS — N1831 Chronic kidney disease, stage 3a: Secondary | ICD-10-CM | POA: Diagnosis not present

## 2021-03-05 DIAGNOSIS — L97518 Non-pressure chronic ulcer of other part of right foot with other specified severity: Secondary | ICD-10-CM | POA: Diagnosis not present

## 2021-03-05 DIAGNOSIS — Z8589 Personal history of malignant neoplasm of other organs and systems: Secondary | ICD-10-CM | POA: Diagnosis not present

## 2021-03-05 DIAGNOSIS — L97511 Non-pressure chronic ulcer of other part of right foot limited to breakdown of skin: Secondary | ICD-10-CM | POA: Diagnosis not present

## 2021-03-05 DIAGNOSIS — E1151 Type 2 diabetes mellitus with diabetic peripheral angiopathy without gangrene: Secondary | ICD-10-CM | POA: Diagnosis not present

## 2021-03-05 DIAGNOSIS — E11621 Type 2 diabetes mellitus with foot ulcer: Secondary | ICD-10-CM | POA: Diagnosis not present

## 2021-03-05 DIAGNOSIS — M86371 Chronic multifocal osteomyelitis, right ankle and foot: Secondary | ICD-10-CM | POA: Diagnosis not present

## 2021-03-05 DIAGNOSIS — G4733 Obstructive sleep apnea (adult) (pediatric): Secondary | ICD-10-CM | POA: Diagnosis not present

## 2021-03-05 DIAGNOSIS — E1169 Type 2 diabetes mellitus with other specified complication: Secondary | ICD-10-CM | POA: Diagnosis not present

## 2021-03-05 DIAGNOSIS — Z7984 Long term (current) use of oral hypoglycemic drugs: Secondary | ICD-10-CM | POA: Diagnosis not present

## 2021-03-05 DIAGNOSIS — I129 Hypertensive chronic kidney disease with stage 1 through stage 4 chronic kidney disease, or unspecified chronic kidney disease: Secondary | ICD-10-CM | POA: Diagnosis not present

## 2021-03-05 DIAGNOSIS — E1140 Type 2 diabetes mellitus with diabetic neuropathy, unspecified: Secondary | ICD-10-CM | POA: Diagnosis not present

## 2021-03-05 DIAGNOSIS — E1122 Type 2 diabetes mellitus with diabetic chronic kidney disease: Secondary | ICD-10-CM | POA: Diagnosis not present

## 2021-03-05 DIAGNOSIS — I2510 Atherosclerotic heart disease of native coronary artery without angina pectoris: Secondary | ICD-10-CM | POA: Diagnosis not present

## 2021-03-05 LAB — GLUCOSE, CAPILLARY
Glucose-Capillary: 168 mg/dL — ABNORMAL HIGH (ref 70–99)
Glucose-Capillary: 140 mg/dL — ABNORMAL HIGH (ref 70–99)

## 2021-03-05 NOTE — Progress Notes (Signed)
AMYR, SLUDER ([id number]) Visit Report for 02/27/2021 Arrival Information Details Patient Name: Date of Service: [name] [name], [name] [name] 02/27/2021 10:30 A M Medical Record Number: [id number] Patient Account Number: [id number] Date of Birth/Sex: Treating RN: 04/23/1951 (70 y.o. [name] Primary Care Rakeem Colley: [name] Other Clinician: Referring Iverson Sees: Treating Bohdi Leeds/Extender: Lanier Clam in Treatment: 5 Visit Information History Since Last Visit Added or deleted any medications: No Patient Arrived: Ambulatory Any new allergies or adverse reactions: No Arrival Time: 10:41 Had a fall or experienced change in No Accompanied By: alone activities of daily living that may affect Transfer Assistance: None risk of falls: Patient Identification Verified: Yes Signs or symptoms of abuse/neglect since last visito No Secondary Verification Process Completed: Yes Hospitalized since last visit: No Patient Requires Transmission-Based Precautions: No Implantable device outside of the clinic excluding No Patient Has Alerts: Yes cellular tissue based products placed in the center Patient Alerts: R ABI non compressible since last visit: L ABI non compressible Has Dressing in Place as Prescribed: Yes Has Footwear/Offloading in Place as Prescribed: Yes Right: Wedge Shoe Pain Present Now: No Electronic Signature(s) Signed: 02/27/2021 6:20:17 PM By: Levan Hurst RN, BSN Entered By: Levan Hurst on 02/27/2021 10:41:31 -------------------------------------------------------------------------------- Encounter Discharge Information Details Patient Name: Date of Service: [name] Hawthorne B. 02/27/2021 10:30 A M Medical Record Number: [id number] Patient Account Number: [id number] Date of Birth/Sex: Treating RN: 31-Aug-1950 (70 y.o. [name] Primary Care Furqan Gosselin: [name] Other Clinician: Referring Authur Cubit: Treating Lemon Whitacre/Extender:  Lanier Clam in Treatment: 5 Encounter Discharge Information Items Post Procedure Vitals Discharge Condition: Stable Temperature (F): 97.8 Ambulatory Status: Ambulatory Pulse (bpm): 49 Discharge Destination: Home Respiratory Rate (breaths/min): 18 Transportation: Private Auto Blood Pressure (mmHg): 145/75 Accompanied By: alone Schedule Follow-up Appointment: Yes Clinical Summary of Care: Patient Declined Electronic Signature(s) Signed: 02/27/2021 6:20:17 PM By: Levan Hurst RN, BSN Entered By: Levan Hurst on 02/27/2021 11:32:22 -------------------------------------------------------------------------------- Lower Extremity Assessment Details Patient Name: Date of Service: [name] Hawthorne B. 02/27/2021 10:30 A M Medical Record Number: [id number] Patient Account Number: [id number] Date of Birth/Sex: Treating RN: [birth year] (70 y.o. [name] Primary Care Cherysh Epperly: [name] Other Clinician: Referring Bianna Haran: Treating Loany Neuroth/Extender: Lanier Clam in Treatment: 5 Edema Assessment Assessed: Shirlyn Goltz: No] Patrice Paradise: No] Edema: [Left: Ye] [Right: s] Calf Left: Right: Point of Measurement: From Medial Instep 36.5 cm Ankle Left: Right: Point of Measurement: From Medial Instep 23.5 cm Vascular Assessment Pulses: Dorsalis Pedis Palpable: [Right:Yes] Electronic Signature(s) Signed: 02/27/2021 6:20:17 PM By: Levan Hurst RN, BSN Entered By: Levan Hurst on 02/27/2021 10:47:51 -------------------------------------------------------------------------------- Multi Wound Chart Details Patient Name: Date of Service: [name] Hawthorne B. 02/27/2021 10:30 A M Medical Record Number: [id number] Patient Account Number: [id number] Date of Birth/Sex: Treating RN: Dec 15, 1950 (70 y.o. [name], [name] Primary Care Tanishia Lemaster: [name] Other Clinician: Referring Roxann Vierra: Treating Surafel Hilleary/Extender: Lanier Clam in Treatment: 5 Vital Signs Height(in): 66 Capillary Blood Glucose(mg/dl): 153 Weight(lbs): 222 Pulse(bpm): 15 Body Mass Index(BMI): 29 Blood Pressure(mmHg): 145/74 Temperature(F): 97.8 Respiratory Rate(breaths/min): 18 Photos: [1:Right, Dorsal T Great oe] [2:Right, Medial T Great] [3:oe Right, Plantar T Great] Wound Location: [1:Gradually Appeared] [2:Gradually Appeared] [3:Gradually Appeared] Wounding Event: [1:Diabetic Wound/Ulcer of the Lower] [2:Diabetic Wound/Ulcer of the Lower] [3:Diabetic Wound/Ulcer of the Lower] Primary Etiology: [1:Extremity Hypertension, Type II Diabetes, Gout,] [2:Extremity Hypertension, Type II Diabetes, Gout,] [3:Extremity Hypertension, Type II Diabetes, Gout,] Comorbid History: [1:Osteomyelitis, Neuropathy 12/11/2020] [2:Osteomyelitis, Neuropathy 12/11/2020] [3:Osteomyelitis, Neuropathy 12/11/2020] Date  Acquired: [1:5] [2:5] [3:5] Weeks of Treatment: [1:Open] [2:Open] [3:Open] Wound Status: [1:0x0x0] [2:0x0x0] [3:0.1x0.1x0.1] Measurements L x W x D (cm) [1:0] [2:0] [3:0.008] A (cm) : rea [1:0] [2:0] [3:0.001] Volume (cm) : [1:100.00%] [2:100.00%] [3:74.20%] % Reduction in A [1:rea: 100.00%] [2:100.00%] [3:92.30%] % Reduction in Volume: [1:Grade 3] [2:Grade 3] [3:Grade 3] Classification: [1:None Present] [2:None Present] [3:Small] Exudate A mount: [1:N/A] [2:N/A] [3:Serosanguineous] Exudate Type: [1:N/A] [2:N/A] [3:red, brown] Exudate Color: [1:Distinct, outline attached] [2:Distinct, outline attached] [3:Distinct, outline attached] Wound Margin: [1:None Present (0%)] [2:None Present (0%)] [3:Large (67-100%)] Granulation A mount: [1:N/A] [2:N/A] [3:Pink, Pale] Granulation Quality: [1:None Present (0%)] [2:None Present (0%)] [3:None Present (0%)] Necrotic A mount: [1:Fascia: No] [2:Fascia: No] [3:Fascia: No] Exposed Structures: [1:Fat Layer (Subcutaneous Tissue): No Tendon: No Muscle: No Joint: No Bone: No Large (67-100%)] [2:Fat Layer  (Subcutaneous Tissue): No Tendon: No Muscle: No Joint: No Bone: No Large (67-100%)] [3:Fat Layer (Subcutaneous Tissue): No Tendon: No Muscle: No  Joint: No Bone: No Large (67-100%)] Epithelialization: [1:N/A] [2:N/A] [3:Debridement - Selective/Open Wound] Debridement: Pre-procedure Verification/Time Out N/A [2:N/A] [3:10:58] Taken: [1:N/A] [2:N/A] [3:Callus] Tissue Debrided: [1:N/A] [2:N/A] [3:Non-Viable Tissue] Level: [1:N/A] [2:N/A] [3:0.25] Debridement A (sq cm): [1:rea N/A] [2:N/A] [3:Curette] Instrument: [1:N/A] [2:N/A] [3:Minimum] Bleeding: [1:N/A] [2:N/A] [3:Pressure] Hemostasis A chieved: [1:N/A] [2:N/A] [3:0] Procedural Pain: [1:N/A] [2:N/A] [3:0] Post Procedural Pain: [1:N/A] [2:N/A] [3:Procedure was tolerated well] Debridement Treatment Response: [1:N/A] [2:N/A] [3:0.1x0.1x0.1] Post Debridement Measurements L x W x D (cm) [1:N/A] [2:N/A] [3:0.001] Post Debridement Volume: (cm) [1:N/A] [2:N/A] [3:Debridement] Wound Number: 4 N/A N/A Photos: N/A N/A Right T Third oe N/A N/A Wound Location: Gradually Appeared N/A N/A Wounding Event: Diabetic Wound/Ulcer of the Lower N/A N/A Primary Etiology: Extremity Hypertension, Type II Diabetes, Gout, N/A N/A Comorbid History: Osteomyelitis, Neuropathy 12/11/2020 N/A N/A Date Acquired: 5 N/A N/A Weeks of Treatment: Open N/A N/A Wound Status: 0x0x0 N/A N/A Measurements L x W x D (cm) 0 N/A N/A A (cm) : rea 0 N/A N/A Volume (cm) : 100.00% N/A N/A % Reduction in A rea: 100.00% N/A N/A % Reduction in Volume: Grade 2 N/A N/A Classification: None Present N/A N/A Exudate A mount: N/A N/A N/A Exudate Type: N/A N/A N/A Exudate Color: Distinct, outline attached N/A N/A Wound Margin: None Present (0%) N/A N/A Granulation A mount: N/A N/A N/A Granulation Quality: None Present (0%) N/A N/A Necrotic A mount: Fascia: No N/A N/A Exposed Structures: Fat Layer (Subcutaneous Tissue): No Tendon: No Muscle: No Joint:  No Bone: No Large (67-100%) N/A N/A Epithelialization: N/A N/A N/A Debridement: N/A N/A N/A Tissue Debrided: N/A N/A N/A Level: N/A N/A N/A Debridement A (sq cm): rea N/A N/A N/A Instrument: N/A N/A N/A Bleeding: N/A N/A N/A Hemostasis A chieved: N/A N/A N/A Procedural Pain: N/A N/A N/A Post Procedural Pain: N/A N/A N/A Debridement Treatment Response: N/A N/A N/A Post Debridement Measurements L x W x D (cm) N/A N/A N/A Post Debridement Volume: (cm) N/A N/A N/A Procedures Performed: Treatment Notes Electronic Signature(s) Signed: 02/27/2021 4:52:40 PM By: Linton Ham MD Signed: 02/27/2021 6:11:30 PM By: Deon Pilling Entered By: Linton Ham on 02/27/2021 11:08:40 -------------------------------------------------------------------------------- Multi-Disciplinary Care Plan Details Patient Name: Date of Service: [name] Hawthorne B. 02/27/2021 10:30 A M Medical Record Number: [id number] Patient Account Number: [id number] Date of Birth/Sex: Treating RN: 03-21-1951 (70 y.o. [name] Primary Care Jaidan Stachnik: [name] Other Clinician: Referring Presley Gora: Treating Shilpa Bushee/Extender: Lanier Clam in Treatment: 5 Multidisciplinary Care Plan reviewed with physician Active Inactive HBO Nursing  Diagnoses: Anxiety related to feelings of confinement associated with the hyperbaric oxygen chamber Anxiety related to knowledge deficit of hyperbaric oxygen therapy and treatment procedures Discomfort related to temperature and humidity changes inside hyperbaric chamber Potential for barotraumas to ears, sinuses, teeth, and lungs or cerebral gas embolism related to changes in atmospheric pressure inside hyperbaric oxygen chamber Potential for oxygen toxicity seizures related to delivery of 100% oxygen at an increased atmospheric pressure Potential for pulmonary oxygen toxicity related to delivery of 100% oxygen at an increased atmospheric  pressure Goals: Barotrauma will be prevented during HBO2 Date Initiated: 01/22/2021 T arget Resolution Date: 03/21/2021 Goal Status: Active Patient and/or family will be able to state/discuss factors appropriate to the management of their disease process during treatment Date Initiated: 01/22/2021 T arget Resolution Date: 03/21/2021 Goal Status: Active Patient will tolerate the hyperbaric oxygen therapy treatment Date Initiated: 01/22/2021 T arget Resolution Date: 03/21/2021 Goal Status: Active Patient will tolerate the internal climate of the chamber Date Initiated: 01/22/2021 T arget Resolution Date: 03/21/2021 Goal Status: Active Patient/caregiver will verbalize understanding of HBO goals, rationale, procedures and potential hazards Date Initiated: 01/22/2021 T arget Resolution Date: 03/21/2021 Goal Status: Active Signs and symptoms of pulmonary oxygen toxicity will be recognized and promptly addressed Date Initiated: 01/22/2021 T arget Resolution Date: 03/21/2021 Goal Status: Active Signs and symptoms of seizure will be recognized and promptly addressed ; seizing patients will suffer no harm Date Initiated: 01/22/2021 Target Resolution Date: 03/21/2021 Goal Status: Active Interventions: Administer a five (5) minute air break for patient if signs and symptoms of seizure appear and notify the hyperbaric physician Administer decongestants, per physician orders, prior to HBO2 Administer the correct therapeutic gas delivery based on the patients needs and limitations, per physician order Assess and provide for patients comfort related to the hyperbaric environment and equalization of middle ear Assess for signs and symptoms related to adverse events, including but not limited to confinement anxiety, pneumothorax, oxygen toxicity and baurotrauma Assess patient for any history of confinement anxiety Assess patient's knowledge and expectations regarding hyperbaric medicine and provide education related  to the hyperbaric environment, goals of treatment and prevention of adverse events Implement protocols to decrease risk of pneumothorax in high risk patients Notes: Nutrition Nursing Diagnoses: Impaired glucose control: actual or potential Potential for alteratiion in Nutrition/Potential for imbalanced nutrition Goals: Patient/caregiver agrees to and verbalizes understanding of need to use nutritional supplements and/or vitamins as prescribed Date Initiated: 01/22/2021 Date Inactivated: 02/27/2021 Target Resolution Date: 02/21/2021 Goal Status: Met Patient/caregiver will maintain therapeutic glucose control Date Initiated: 01/22/2021 Target Resolution Date: 03/28/2021 Goal Status: Active Interventions: Assess HgA1c results as ordered upon admission and as needed Assess patient nutrition upon admission and as needed per policy Provide education on elevated blood sugars and impact on wound healing Provide education on nutrition Treatment Activities: Education provided on Nutrition : 01/22/2021 Notes: Wound/Skin Impairment Nursing Diagnoses: Impaired tissue integrity Knowledge deficit related to ulceration/compromised skin integrity Goals: Patient/caregiver will verbalize understanding of skin care regimen Date Initiated: 01/22/2021 Target Resolution Date: 03/28/2021 Goal Status: Active Interventions: Assess patient/caregiver ability to obtain necessary supplies Assess patient/caregiver ability to perform ulcer/skin care regimen upon admission and as needed Assess ulceration(s) every visit Provide education on ulcer and skin care Notes: Electronic Signature(s) Signed: 02/27/2021 6:20:17 PM By: Levan Hurst RN, BSN Entered By: Levan Hurst on 02/27/2021 10:58:39 -------------------------------------------------------------------------------- Pain Assessment Details Patient Name: Date of Service: [name] Hawthorne B. 02/27/2021 10:30 A M Medical Record Number: [id number] Patient  Account Number: [id number] Date  of Birth/Sex: Treating RN: 12/06/1950 (70 y.o. [name] Primary Care Diedre Maclellan: [name] Other Clinician: Referring Yarethzi Branan: Treating Makailyn Mccormick/Extender: Lanier Clam in Treatment: 5 Active Problems Location of Pain Severity and Description of Pain Patient Has Paino No Site Locations Pain Management and Medication Current Pain Management: Electronic Signature(s) Signed: 02/27/2021 6:20:17 PM By: Levan Hurst RN, BSN Entered By: Levan Hurst on 02/27/2021 10:42:10 -------------------------------------------------------------------------------- Patient/Caregiver Education Details Patient Name: Date of Service: [name] [name] 8/18/2022andnbsp10:30 A M Medical Record Number: [id number] Patient Account Number: [id number] Date of Birth/Gender: Treating RN: [birth year] (70 y.o. [name] Primary Care Physician: [name] Other Clinician: Referring Physician: Treating Physician/Extender: Lanier Clam in Treatment: 5 Education Assessment Education Provided To: Patient Education Topics Provided Wound/Skin Impairment: Methods: Explain/Verbal Responses: State content correctly Electronic Signature(s) Signed: 02/27/2021 6:20:17 PM By: Levan Hurst RN, BSN Entered By: Levan Hurst on 02/27/2021 10:58:53 -------------------------------------------------------------------------------- Wound Assessment Details Patient Name: Date of Service: [name] Hawthorne B. 02/27/2021 10:30 A M Medical Record Number: [id number] Patient Account Number: [id number] Date of Birth/Sex: Treating RN: [birth year] (70 y.o. [name] Primary Care Aidel Davisson: [name] Other Clinician: Referring Daaiyah Baumert: Treating Kagan Mutchler/Extender: Lanier Clam in Treatment: 5 Wound Status Wound Number: 1 Primary Diabetic Wound/Ulcer of the Lower Extremity Etiology: Wound  Location: Right, Dorsal T Great oe Wound Status: Open Wounding Event: Gradually Appeared Comorbid Hypertension, Type II Diabetes, Gout, Osteomyelitis, Date Acquired: 12/11/2020 History: Neuropathy Weeks Of Treatment: 5 Clustered Wound: No Photos Wound Measurements Length: (cm) Width: (cm) Depth: (cm) Area: (cm) Volume: (cm) 0 % Reduction in Area: 100% 0 % Reduction in Volume: 100% 0 Epithelialization: Large (67-100%) 0 Tunneling: No 0 Undermining: No Wound Description Classification: Grade 3 Wound Margin: Distinct, outline attached Exudate Amount: None Present Foul Odor After Cleansing: No Slough/Fibrino No Wound Bed Granulation Amount: None Present (0%) Exposed Structure Necrotic Amount: None Present (0%) Fascia Exposed: No Fat Layer (Subcutaneous Tissue) Exposed: No Tendon Exposed: No Muscle Exposed: No Joint Exposed: No Bone Exposed: No Electronic Signature(s) Signed: 02/27/2021 6:20:17 PM By: Levan Hurst RN, BSN Signed: 03/05/2021 9:12:52 AM By: Sandre Kitty Entered By: Sandre Kitty on 02/27/2021 10:51:58 -------------------------------------------------------------------------------- Wound Assessment Details Patient Name: Date of Service: [name] Hawthorne B. 02/27/2021 10:30 A M Medical Record Number: [id number] Patient Account Number: [id number] Date of Birth/Sex: Treating RN: Jun 17, 1951 (70 y.o. [name] Primary Care Sally Reimers: [name] Other Clinician: Referring Dymond Spreen: Treating Ravinder Hofland/Extender: Lanier Clam in Treatment: 5 Wound Status Wound Number: 2 Primary Diabetic Wound/Ulcer of the Lower Extremity Etiology: Wound Location: Right, Medial T Great oe Wound Status: Open Wounding Event: Gradually Appeared Comorbid Hypertension, Type II Diabetes, Gout, Osteomyelitis, Date Acquired: 12/11/2020 History: Neuropathy Weeks Of Treatment: 5 Clustered Wound: No Photos Wound Measurements Length:  (cm) Width: (cm) Depth: (cm) Area: (cm) Volume: (cm) 0 % Reduction in Area: 100% 0 % Reduction in Volume: 100% 0 Epithelialization: Large (67-100%) 0 Tunneling: No 0 Undermining: No Wound Description Classification: Grade 3 Wound Margin: Distinct, outline attached Exudate Amount: None Present Foul Odor After Cleansing: No Slough/Fibrino No Wound Bed Granulation Amount: None Present (0%) Exposed Structure Necrotic Amount: None Present (0%) Fascia Exposed: No Fat Layer (Subcutaneous Tissue) Exposed: No Tendon Exposed: No Muscle Exposed: No Joint Exposed: No Bone Exposed: No Electronic Signature(s) Signed: 02/27/2021 6:20:17 PM By: Levan Hurst RN, BSN Signed: 03/05/2021 9:12:52 AM By: Sandre Kitty Entered By: Sandre Kitty on 02/27/2021 10:52:33 -------------------------------------------------------------------------------- Wound Assessment Details Patient  Name: Date of Service: [name] [name], [name] [name] 02/27/2021 10:30 A M Medical Record Number: [id number] Patient Account Number: [id number] Date of Birth/Sex: Treating RN: 09/06/1950 (70 y.o. [name] Primary Care Amiley Shishido: [name] Other Clinician: Referring Lamount Bankson: Treating Fredderick Swanger/Extender: Lanier Clam in Treatment: 5 Wound Status Wound Number: 3 Primary Diabetic Wound/Ulcer of the Lower Extremity Etiology: Wound Location: Right, Plantar T Great oe Wound Status: Open Wounding Event: Gradually Appeared Comorbid Hypertension, Type II Diabetes, Gout, Osteomyelitis, Date Acquired: 12/11/2020 History: Neuropathy Weeks Of Treatment: 5 Clustered Wound: No Photos Wound Measurements Length: (cm) 0.1 Width: (cm) 0.1 Depth: (cm) 0.1 Area: (cm) 0.008 Volume: (cm) 0.001 % Reduction in Area: 74.2% % Reduction in Volume: 92.3% Epithelialization: Large (67-100%) Tunneling: No Undermining: No Wound Description Classification: Grade 3 Wound Margin: Distinct, outline  attached Exudate Amount: Small Exudate Type: Serosanguineous Exudate Color: red, brown Foul Odor After Cleansing: No Slough/Fibrino No Wound Bed Granulation Amount: Large (67-100%) Exposed Structure Granulation Quality: Pink, Pale Fascia Exposed: No Necrotic Amount: None Present (0%) Fat Layer (Subcutaneous Tissue) Exposed: No Tendon Exposed: No Muscle Exposed: No Joint Exposed: No Bone Exposed: No Treatment Notes Wound #3 (Toe Great) Wound Laterality: Plantar, Right Cleanser Soap and Water Discharge Instruction: May shower and wash wound with dial antibacterial soap and water prior to dressing change. Peri-Wound Care Topical Primary Dressing KerraCel Ag Gelling Fiber Dressing, 2x2 in (silver alginate) Discharge Instruction: Apply silver alginate to wound bed as instructed Secondary Dressing Woven Gauze Sponges 2x2 in Discharge Instruction: Apply over primary dressing as directed. Secured With Conforming Stretch Gauze Bandage, Sterile 2x75 (in/in) Discharge Instruction: Secure with stretch gauze as directed. Paper Tape, 1x10 (in/yd) Discharge Instruction: Secure dressing with tape as directed. Compression Wrap Compression Stockings Add-Ons Electronic Signature(s) Signed: 02/27/2021 6:20:17 PM By: Levan Hurst RN, BSN Signed: 03/05/2021 9:12:52 AM By: Sandre Kitty Entered By: Sandre Kitty on 02/27/2021 10:53:04 -------------------------------------------------------------------------------- Wound Assessment Details Patient Name: Date of Service: [name] Hawthorne B. 02/27/2021 10:30 A M Medical Record Number: [id number] Patient Account Number: [id number] Date of Birth/Sex: Treating RN: 01/01/1951 (70 y.o. [name] Primary Care Ranon Coven: [name] Other Clinician: Referring Ronita Hargreaves: Treating Jerelene Salaam/Extender: Lanier Clam in Treatment: 5 Wound Status Wound Number: 4 Primary Diabetic Wound/Ulcer of the Lower  Extremity Etiology: Wound Location: Right T Third oe Wound Status: Open Wounding Event: Gradually Appeared Comorbid Hypertension, Type II Diabetes, Gout, Osteomyelitis, Date Acquired: 12/11/2020 History: Neuropathy Weeks Of Treatment: 5 Clustered Wound: No Photos Wound Measurements Length: (cm) Width: (cm) Depth: (cm) Area: (cm) Volume: (cm) 0 % Reduction in Area: 100% 0 % Reduction in Volume: 100% 0 Epithelialization: Large (67-100%) 0 Tunneling: No 0 Undermining: No Wound Description Classification: Grade 2 Wound Margin: Distinct, outline attached Exudate Amount: None Present Foul Odor After Cleansing: No Slough/Fibrino No Wound Bed Granulation Amount: None Present (0%) Exposed Structure Necrotic Amount: None Present (0%) Fascia Exposed: No Fat Layer (Subcutaneous Tissue) Exposed: No Tendon Exposed: No Muscle Exposed: No Joint Exposed: No Bone Exposed: No Electronic Signature(s) Signed: 02/27/2021 6:20:17 PM By: Levan Hurst RN, BSN Signed: 03/05/2021 9:12:52 AM By: Sandre Kitty Entered By: Sandre Kitty on 02/27/2021 10:53:33 -------------------------------------------------------------------------------- [place] Details Patient Name: Date of Service: [name] Hawthorne B. 02/27/2021 10:30 A M Medical Record Number: [id number] Patient Account Number: [id number] Date of Birth/Sex: Treating RN: 1951-04-14 (70 y.o. [name] Primary Care Alinna Siple: [name] Other Clinician: Referring Gabrian Hoque: Treating Suki Crockett/Extender: Lanier Clam in Treatment: 5 Vital Signs Time  Taken: 10:41 Temperature (F): 97.8 Height (in): 73 Pulse (bpm): 49 Weight (lbs): 222 Respiratory Rate (breaths/min): 18 Body Mass Index (BMI): 29.3 Blood Pressure (mmHg): 145/74 Capillary Blood Glucose (mg/dl): 153 Reference Range: 80 - 120 mg / dl Electronic Signature(s) Signed: 02/27/2021 6:20:17 PM By: Levan Hurst RN, BSN Entered By: Levan Hurst on 02/27/2021 10:42:05

## 2021-03-06 ENCOUNTER — Encounter (HOSPITAL_BASED_OUTPATIENT_CLINIC_OR_DEPARTMENT_OTHER): Payer: Medicare HMO | Admitting: Internal Medicine

## 2021-03-06 DIAGNOSIS — E1122 Type 2 diabetes mellitus with diabetic chronic kidney disease: Secondary | ICD-10-CM | POA: Diagnosis not present

## 2021-03-06 DIAGNOSIS — M86371 Chronic multifocal osteomyelitis, right ankle and foot: Secondary | ICD-10-CM | POA: Diagnosis not present

## 2021-03-06 DIAGNOSIS — L97518 Non-pressure chronic ulcer of other part of right foot with other specified severity: Secondary | ICD-10-CM | POA: Diagnosis not present

## 2021-03-06 DIAGNOSIS — Z8589 Personal history of malignant neoplasm of other organs and systems: Secondary | ICD-10-CM | POA: Diagnosis not present

## 2021-03-06 DIAGNOSIS — I129 Hypertensive chronic kidney disease with stage 1 through stage 4 chronic kidney disease, or unspecified chronic kidney disease: Secondary | ICD-10-CM | POA: Diagnosis not present

## 2021-03-06 DIAGNOSIS — L97511 Non-pressure chronic ulcer of other part of right foot limited to breakdown of skin: Secondary | ICD-10-CM | POA: Diagnosis not present

## 2021-03-06 DIAGNOSIS — E1140 Type 2 diabetes mellitus with diabetic neuropathy, unspecified: Secondary | ICD-10-CM | POA: Diagnosis not present

## 2021-03-06 DIAGNOSIS — Z7984 Long term (current) use of oral hypoglycemic drugs: Secondary | ICD-10-CM | POA: Diagnosis not present

## 2021-03-06 DIAGNOSIS — N1831 Chronic kidney disease, stage 3a: Secondary | ICD-10-CM | POA: Diagnosis not present

## 2021-03-06 DIAGNOSIS — E11621 Type 2 diabetes mellitus with foot ulcer: Secondary | ICD-10-CM | POA: Diagnosis not present

## 2021-03-06 DIAGNOSIS — E1151 Type 2 diabetes mellitus with diabetic peripheral angiopathy without gangrene: Secondary | ICD-10-CM | POA: Diagnosis not present

## 2021-03-06 DIAGNOSIS — G4733 Obstructive sleep apnea (adult) (pediatric): Secondary | ICD-10-CM | POA: Diagnosis not present

## 2021-03-06 DIAGNOSIS — I2510 Atherosclerotic heart disease of native coronary artery without angina pectoris: Secondary | ICD-10-CM | POA: Diagnosis not present

## 2021-03-06 DIAGNOSIS — E1169 Type 2 diabetes mellitus with other specified complication: Secondary | ICD-10-CM | POA: Diagnosis not present

## 2021-03-06 DIAGNOSIS — I252 Old myocardial infarction: Secondary | ICD-10-CM | POA: Diagnosis not present

## 2021-03-06 LAB — GLUCOSE, CAPILLARY
Glucose-Capillary: 172 mg/dL — ABNORMAL HIGH (ref 70–99)
Glucose-Capillary: 132 mg/dL — ABNORMAL HIGH (ref 70–99)

## 2021-03-06 NOTE — Progress Notes (Signed)
[name] [name], [name] [name] ([id number]) Visit Report for 03/06/2021 Arrival Information Details Patient Name: Date of Service: [name] [name], [name] [name] 03/06/2021 8:00 A M Medical Record Number: [id number] Patient Account Number: [id number] Date of Birth/Sex: Treating RN: 09/17/1950 (70 y.o. [name] Primary Care Cataleah Stites: [name] Other Clinician: Donavan Burnet Referring Adhya Cocco: Treating Rheagan Nayak/Extender: Lanier Clam in Treatment: 6 Visit Information History Since Last Visit All ordered tests and consults were completed: Yes Patient Arrived: Ambulatory Added or deleted any medications: No Arrival Time: 07:40 Any new allergies or adverse reactions: No Accompanied By: self Had a fall or experienced change in No Transfer Assistance: None activities of daily living that may affect Patient Identification Verified: Yes risk of falls: Secondary Verification Process Completed: Yes Signs or symptoms of abuse/neglect since last visito No Patient Requires Transmission-Based Precautions: No Hospitalized since last visit: No Patient Has Alerts: Yes Implantable device outside of the clinic excluding No cellular tissue based products placed in the center since last visit: Pain Present Now: No Electronic Signature(s) Signed: 03/06/2021 5:02:32 PM By: Donavan Burnet EMT Entered By: Donavan Burnet on 03/06/2021 08:00:01 -------------------------------------------------------------------------------- Encounter Discharge Information Details Patient Name: Date of Service: [name] Hawthorne B. 03/06/2021 8:00 A M Medical Record Number: [id number] Patient Account Number: [id number] Date of Birth/Sex: Treating RN: 06/01/1951 (70 y.o. [name] Primary Care Kevork Joyce: [name] Other Clinician: Donavan Burnet Referring Justa Hatchell: Treating Wenonah Milo/Extender: Lanier Clam in Treatment: 6 Encounter Discharge Information  Items Discharge Condition: Stable Ambulatory Status: Ambulatory Discharge Destination: Other (Note Required) Transportation: Other Accompanied By: self Schedule Follow-up Appointment: No Clinical Summary of Care: Notes patient has wound care visit after HBO Treatment Electronic Signature(s) Signed: 03/06/2021 5:02:32 PM By: Donavan Burnet EMT Entered By: Donavan Burnet on 03/06/2021 11:52:09 -------------------------------------------------------------------------------- Vitals Details Patient Name: Date of Service: [name] Hawthorne B. 03/06/2021 8:00 A M Medical Record Number: [id number] Patient Account Number: [id number] Date of Birth/Sex: Treating RN: 08/09/1950 (70 y.o. [name] Primary Care Mickayla Trouten: [name] Other Clinician: Donavan Burnet Referring Tynetta Bachmann: Treating Cordelia Bessinger/Extender: Lanier Clam in Treatment: 6 Vital Signs Time Taken: 07:59 Temperature (F): 98.2 Height (in): 73 Pulse (bpm): 65 Weight (lbs): 222 Respiratory Rate (breaths/min): 18 Body Mass Index (BMI): 29.3 Blood Pressure (mmHg): 130/65 Capillary Blood Glucose (mg/dl): 172 Reference Range: 80 - 120 mg / dl Electronic Signature(s) Signed: 03/06/2021 5:02:32 PM By: Donavan Burnet EMT Entered By: Donavan Burnet on 03/06/2021 08:01:01

## 2021-03-06 NOTE — Progress Notes (Signed)
CLEAVEN, REUTHER ([id number]) Visit Report for 03/04/2021 HBO Details Patient Name: Date of Service: [name] [name], [name] [name] 03/04/2021 8:00 A M Medical Record Number: [id number] Patient Account Number: [id number] Date of Birth/Sex: Treating RN: [birth year] (70 y.o. [name], [name] Primary Care Nickalous Stingley: [name] Other Clinician: Donavan Burnet Referring Sebrina Kessner: Treating Amarya Kuehl/Extender: Lanier Clam in Treatment: 5 HBO Treatment Course Details Treatment Course Number: 1 Ordering Micaila Ziemba: Bernerd Pho Treatments Ordered: otal 40 HBO Treatment Start Date: 01/27/2021 HBO Indication: Diabetic Ulcer(s) of the Lower Extremity HBO Treatment Details Treatment Number: 22 Patient Type: Outpatient Chamber Type: Monoplace Chamber Serial #: [id number] Treatment Protocol: 2.5 ATA with 90 minutes oxygen, with two 5 minute air breaks Treatment Details Compression Rate Down: 2.0 psi / minute De-Compression Rate Up: 2.5 psi / minute A breaks and breathing ir Compress Tx Pressure periods Decompress Decompress Begins Reached (leave unused spaces Begins Ends blank) Chamber Pressure (ATA 1 2.5 2.5 2.5 2.5 2.5 - - 2.5 1 ) Clock Time (24 hr) 08:08 08:20 08:50 08:55 09:25 09:30 - - 10:00 10:09 Treatment Length: 121 (minutes) Treatment Segments: 4 Vital Signs Capillary Blood Glucose Reference Range: 80 - 120 mg / dl HBO Diabetic Blood Glucose Intervention Range: <131 mg/dl or >249 mg/dl Time Vitals Blood Respiratory Capillary Blood Glucose Pulse Action Type: Pulse: Temperature: Taken: Pressure: Rate: Glucose (mg/dl): Meter #: Oximetry (%) Taken: Pre 08:00 159/80 57 18 97.9 188 Post 10:15 138/65 53 18 97.8 147 Treatment Response Treatment Toleration: Well Treatment Completion Status: Treatment Completed without Adverse Event Additional Procedure Documentation Tissue Sevierity: Limited to breakdown of skin Leaann Nevils Notes no concerns with rx  given Physician HBO Attestation: I certify that I supervised this HBO treatment in accordance with Medicare guidelines. A trained emergency response team is readily available per Yes hospital policies and procedures. Continue HBOT as ordered. Yes Electronic Signature(s) Signed: 03/05/2021 8:02:04 AM By: Linton Ham MD Entered By: Linton Ham on 03/04/2021 19:30:36 -------------------------------------------------------------------------------- HBO Safety Checklist Details Patient Name: Date of Service: [name] Hawthorne B. 03/04/2021 8:00 A M Medical Record Number: [id number] Patient Account Number: [id number] Date of Birth/Sex: Treating RN: June 23, 1951 (70 y.o. [name], [name] Primary Care Sharod Petsch: [name] Other Clinician: Donavan Burnet Referring Aengus Sauceda: Treating Jisele Price/Extender: Lanier Clam in Treatment: 5 HBO Safety Checklist Items Safety Checklist Consent Form Signed Patient voided / foley secured and emptied When did you last eato 0715 Last dose of injectable or oral agent metformin after yesterday's HBO Tx Ostomy pouch emptied and vented if applicable NA All implantable devices assessed, documented and approved NA Intravenous access site secured and place NA Valuables secured Linens and cotton and cotton/polyester blend (less than 51% polyester) Personal oil-based products / skin lotions / body lotions removed Wigs or hairpieces removed NA Smoking or tobacco materials removed NA Books / newspapers / magazines / loose paper removed Cologne, aftershave, perfume and deodorant removed Jewelry removed (may wrap wedding band) NA Make-up removed NA Hair care products removed NA Battery operated devices (external) removed Heating patches and chemical warmers removed Titanium eyewear removed Nail polish cured greater than 10 hours NA Casting material cured greater than 10 hours NA Hearing aids removed NA Loose  dentures or partials removed NA Prosthetics have been removed NA Patient demonstrates correct use of air break device (if applicable) Patient concerns have been addressed Patient grounding bracelet on and cord attached to chamber Specifics for Inpatients (complete in addition to above) Medication sheet sent with patient NA Intravenous medications  needed or due during therapy sent with patient NA Drainage tubes (e.g. nasogastric tube or chest tube secured and vented) NA Endotracheal or Tracheotomy tube secured NA Cuff deflated of air and inflated with saline NA Airway suctioned NA Electronic Signature(s) Signed: 03/06/2021 9:17:36 AM By: Donavan Burnet EMT Entered By: Donavan Burnet on 03/04/2021 08:07:46

## 2021-03-06 NOTE — Progress Notes (Signed)
[name], [name] ([id number]) Visit Report for 03/05/2021 Arrival Information Details Patient Name: Date of Service: [name] [name], [name] [name] 03/05/2021 8:00 A M Medical Record Number: [id number] Patient Account Number: [id number] Date of Birth/Sex: Treating RN: 1950-09-11 (70 y.o. [name] [name], Vaughan Basta Primary Care Oris Staffieri: [name] Other Clinician: Donavan Burnet Referring Fradel Baldonado: Treating Dewaine Morocho/Extender: Tally Joe in Treatment: 6 Visit Information History Since Last Visit All ordered tests and consults were completed: Yes Patient Arrived: Ambulatory Added or deleted any medications: No Arrival Time: 07:40 Any new allergies or adverse reactions: No Accompanied By: self Had a fall or experienced change in No Transfer Assistance: None activities of daily living that may affect Patient Identification Verified: Yes risk of falls: Secondary Verification Process Completed: Yes Signs or symptoms of abuse/neglect since last visito No Patient Requires Transmission-Based Precautions: No Hospitalized since last visit: No Patient Has Alerts: Yes Implantable device outside of the clinic excluding No cellular tissue based products placed in the center since last visit: Pain Present Now: No Electronic Signature(s) Signed: 03/06/2021 9:17:36 AM By: Donavan Burnet EMT Entered By: Donavan Burnet on 03/05/2021 08:00:39 -------------------------------------------------------------------------------- Encounter Discharge Information Details Patient Name: Date of Service: [name] Hawthorne B. 03/05/2021 8:00 A M Medical Record Number: [id number] Patient Account Number: [id number] Date of Birth/Sex: Treating RN: [birth year] (70 y.o. [name] [name] Primary Care Gioia Ranes: [name] Other Clinician: Donavan Burnet Referring Fountain Derusha: Treating Marlina Cataldi/Extender: Tally Joe in Treatment: 6 Encounter Discharge Information  Items Discharge Condition: Stable Ambulatory Status: Ambulatory Discharge Destination: Other (Note Required) Transportation: Private Auto Accompanied By: self Schedule Follow-up Appointment: No Clinical Summary of Care: Notes Patient goes to work after treatment Electronic Signature(s) Signed: 03/06/2021 9:17:36 AM By: Donavan Burnet EMT Entered By: Donavan Burnet on 03/05/2021 11:15:37 -------------------------------------------------------------------------------- [place] Details Patient Name: Date of Service: [name] Hawthorne B. 03/05/2021 8:00 A M Medical Record Number: [id number] Patient Account Number: [id number] Date of Birth/Sex: Treating RN: 05-28-1951 (70 y.o. [name] [name] Primary Care Miasia Crabtree: [name] Other Clinician: Donavan Burnet Referring Fredrika Canby: Treating Nymir Ringler/Extender: Tally Joe in Treatment: 6 Vital Signs Time Taken: 08:07 Temperature (F): 98.1 Height (in): 73 Pulse (bpm): 59 Weight (lbs): 222 Respiratory Rate (breaths/min): 18 Body Mass Index (BMI): 29.3 Blood Pressure (mmHg): 132/65 Capillary Blood Glucose (mg/dl): 168 Reference Range: 80 - 120 mg / dl Electronic Signature(s) Signed: 03/06/2021 10:34:15 AM By: Donavan Burnet EMT Entered By: Donavan Burnet on 03/05/2021 [id number]

## 2021-03-06 NOTE — Progress Notes (Signed)
[name], [name] ([id number]) Visit Report for 03/05/2021 HBO Details Patient Name: Date of Service: [name], [name] 03/05/2021 8:00 A M Medical Record Number: [id number] Patient Account Number: [id number] Date of Birth/Sex: Treating RN: 22-Sep-1950 (70 y.o. [name] [name] Primary Care [name] [name]: [name] [name] Other Clinician: Donavan [name] Referring [name] [name]: Treating [name] [name]/Extender: [name] [name] in Treatment: 6 HBO Treatment Course Details Treatment Course Number: 1 Ordering [name] [name]: [name] [name] T Treatments Ordered: otal 40 HBO Treatment Start Date: 01/27/2021 HBO Indication: Diabetic Ulcer(s) of the Lower Extremity HBO Treatment Details Treatment Number: 23 Patient Type: Outpatient Chamber Type: Monoplace Chamber Serial #: [id number] Treatment Protocol: 2.5 ATA with 90 minutes oxygen, with two 5 minute air breaks Treatment Details Compression Rate Down: 2.0 psi / minute De-Compression Rate Up: 2.0 psi / minute A breaks and breathing ir Compress Tx Pressure periods Decompress Decompress Begins Reached (leave unused spaces Begins Ends blank) Chamber Pressure (ATA 1 2.5 2.5 2.5 2.5 2.5 - - 2.5 1 ) Clock Time (24 hr) 08:13 08:25 08:55 09:00 09:30 09:40 - - 10:10 10:22 Treatment Length: 129 (minutes) Treatment Segments: 4 Vital Signs Capillary Blood Glucose Reference Range: 80 - 120 mg / dl HBO Diabetic Blood Glucose Intervention Range: <131 mg/dl or >249 mg/dl Time Vitals Blood Respiratory Capillary Blood Glucose Pulse Action Type: Pulse: Temperature: Taken: Pressure: Rate: Glucose (mg/dl): Meter #: Oximetry (%) Taken: Pre 08:07 132/65 59 18 98.1 168 Post 10:32 139/69 51 18 97.8 140 Treatment Response Treatment Toleration: Well Treatment Completion Status: Treatment Completed without Adverse Event Additional Procedure Documentation Tissue Sevierity: Limited to breakdown of skin Electronic Signature(s) Signed: 03/05/2021  6:37:42 PM By: Worthy Keeler PA-C Signed: 03/06/2021 9:17:36 AM By: [name] [name] EMT Entered By: [name] [name] on 03/05/2021 11:14:25 -------------------------------------------------------------------------------- HBO Safety Checklist Details Patient Name: Date of Service: [name] Hawthorne B. 03/05/2021 8:00 A M Medical Record Number: [id number] Patient Account Number: [id number] Date of Birth/Sex: Treating RN: 11-21-1950 (71 y.o. [name] [name] Primary Care [name] [name]: [name] [name] Other Clinician: Donavan [name] Referring [name] [name]: Treating [name] [name]/Extender: [name] [name] in Treatment: 6 HBO Safety Checklist Items Safety Checklist Consent Form Signed Patient voided / foley secured and emptied When did you last eato 0715 Last dose of injectable or oral agent Yesterday after HBO Tx Ostomy pouch emptied and vented if applicable NA All implantable devices assessed, documented and approved NA Intravenous access site secured and place NA Valuables secured Linens and cotton and cotton/polyester blend (less than 51% polyester) Personal oil-based products / skin lotions / body lotions removed Wigs or hairpieces removed NA Smoking or tobacco materials removed NA Books / newspapers / magazines / loose paper removed Cologne, aftershave, perfume and deodorant removed Jewelry removed (may wrap wedding band) Make-up removed NA Hair care products removed NA Battery operated devices (external) removed Heating patches and chemical warmers removed Titanium eyewear removed Nail polish cured greater than 10 hours NA Casting material cured greater than 10 hours NA Hearing aids removed NA Loose dentures or partials removed NA Prosthetics have been removed NA Patient demonstrates correct use of air break device (if applicable) Patient concerns have been addressed Patient grounding bracelet on and cord attached to chamber Specifics for  Inpatients (complete in addition to above) Medication sheet sent with patient NA Intravenous medications needed or due during therapy sent with patient NA Drainage tubes (e.g. nasogastric tube or chest tube secured and vented) NA Endotracheal or Tracheotomy tube secured NA Cuff deflated of air and inflated  with saline NA Airway suctioned NA Electronic Signature(s) Signed: 03/06/2021 9:17:36 AM By: [name] [name] EMT Entered By: [name] [name] on 03/05/2021 08:12:12

## 2021-03-06 NOTE — Progress Notes (Signed)
IKECHUKWU, ZAHRA ([id number]) Visit Report for 03/04/2021 [place] Details Patient Name: Date of Service: [name] [name], [name] [name] 03/04/2021 Medical Record Number: [id number] Patient Account Number: [id number] Date of Birth/Sex: Treating RN: 01-Aug-1950 (70 y.o. [name], [name] Primary Care Provider: [name] Other Clinician: Donavan Burnet Referring Provider: Treating Provider/Extender: Lanier Clam in Treatment: 5 Diagnosis Coding ICD-10 Codes Code Description [phone] Type 2 diabetes mellitus with foot ulcer L97.518 Non-pressure chronic ulcer of other part of right foot with other specified severity L97.511 Non-pressure chronic ulcer of other part of right foot limited to breakdown of skin M86.371 Chronic multifocal osteomyelitis, right ankle and foot E11.621 Type 2 diabetes mellitus with foot ulcer Facility Procedures CPT4 Code Description Modifier Quantity [id number]-(Facility Use Only) HBOT full body chamber, 27mn , 4 ICD-10 Diagnosis Description E11.621 Type 2 diabetes mellitus with foot ulcer L97.518 Non-pressure chronic ulcer of other part of right foot with other specified severity L97.511 Non-pressure chronic ulcer of other part of right foot limited to breakdown of skin M86.371 Chronic multifocal osteomyelitis, right ankle and foot Physician Procedures Quantity CPT4 Code Description Modifier [id number] PHYS HYPERBARIC OXYGEN THERAPY 1 ICD-10 Diagnosis Description E11.621 Type 2 diabetes mellitus with foot ulcer L97.518 Non-pressure chronic ulcer of other part of right foot with other specified severity L97.511 Non-pressure chronic ulcer of other part of right foot limited to breakdown of skin M86.371 Chronic multifocal osteomyelitis, right ankle and foot Electronic Signature(s) Signed: 03/05/2021 8:02:04 AM By: RLinton HamMD Signed: 03/06/2021 9:17:36 AM By: SDonavan BurnetEMT Entered By: SDonavan Burneton  03/04/2021 10:29:07

## 2021-03-06 NOTE — Progress Notes (Signed)
[name] [name] ([id number]) Visit Report for 03/04/2021 Arrival Information Details Patient Name: Date of Service: [name] [name] 03/04/2021 8:00 A M Medical Record Number: [id number] Patient Account Number: [id number] Date of Birth/Sex: Treating RN: [birth year] (69 y.o. [name], [name] Primary Care [name] [name]: [name] Other Clinician: Donavan Burnet Referring Mattia Liford: Treating Kalyssa Anker/Extender: Lanier Clam in Treatment: 5 Visit Information History Since Last Visit All ordered tests and consults were completed: Yes Patient Arrived: Ambulatory Added or deleted any medications: No Arrival Time: 07:45 Any new allergies or adverse reactions: No Accompanied By: self Had a fall or experienced change in No Transfer Assistance: None activities of daily living that may affect Patient Identification Verified: Yes risk of falls: Secondary Verification Process Completed: Yes Signs or symptoms of abuse/neglect since last visito No Patient Requires Transmission-Based Precautions: No Hospitalized since last visit: No Patient Has Alerts: Yes Implantable device outside of the clinic excluding No cellular tissue based products placed in the center since last visit: Pain Present Now: No Electronic Signature(s) Signed: 03/06/2021 9:17:36 AM By: Donavan Burnet EMT Entered By: Donavan Burnet on 03/04/2021 08:00:00 -------------------------------------------------------------------------------- Encounter Discharge Information Details Patient Name: Date of Service: [name] Hawthorne B. 03/04/2021 8:00 A M Medical Record Number: [id number] Patient Account Number: [id number] Date of Birth/Sex: Treating RN: 1951/06/12 (70 y.o. [name], [name] Primary Care [name] [name]: [name] Other Clinician: Donavan Burnet Referring Fahim Kats: Treating Bethan Adamek/Extender: Lanier Clam in Treatment: 5 Encounter Discharge Information  Items Discharge Condition: Stable Ambulatory Status: Ambulatory Discharge Destination: Other (Note Required) Transportation: Private Auto Accompanied By: self Schedule Follow-up Appointment: No Clinical Summary of Care: Notes patient goes to work post treatment. Electronic Signature(s) Signed: 03/06/2021 9:17:36 AM By: Donavan Burnet EMT Entered By: Donavan Burnet on 03/04/2021 10:49:21 -------------------------------------------------------------------------------- Vitals Details Patient Name: Date of Service: [name] Hawthorne B. 03/04/2021 8:00 A M Medical Record Number: [id number] Patient Account Number: [id number] Date of Birth/Sex: Treating RN: 1951/01/20 (70 y.o. [name], [name] Primary Care [name] [name]: [name] Other Clinician: Donavan Burnet Referring Emmajane Altamura: Treating Ebonye Reade/Extender: Lanier Clam in Treatment: 5 Vital Signs Time Taken: 08:00 Temperature (F): 97.9 Height (in): 73 Pulse (bpm): 57 Weight (lbs): 222 Respiratory Rate (breaths/min): 18 Body Mass Index (BMI): 29.3 Blood Pressure (mmHg): 159/80 Capillary Blood Glucose (mg/dl): 188 Reference Range: 80 - 120 mg / dl Electronic Signature(s) Signed: 03/06/2021 9:17:36 AM By: Donavan Burnet EMT Entered By: Donavan Burnet on 03/04/2021 08:06:37

## 2021-03-06 NOTE — Progress Notes (Addendum)
[name], [name] ([id number]) Visit Report for 03/06/2021 Arrival Information Details Patient Name: Date of Service: [name] [name], [name] [name] 03/06/2021 10:30 A M Medical Record Number: [id number] Patient Account Number: [id number] Date of Birth/Sex: Treating RN: 1951-04-29 (70 y.o. [name] Primary Care Verdun Rackley: [name] Other Clinician: Referring Oryan Winterton: Treating Omah Dewalt/Extender: Lanier Clam in Treatment: 6 Visit Information History Since Last Visit Added or deleted any medications: No Patient Arrived: Ambulatory Any new allergies or adverse reactions: No Arrival Time: 10:25 Had a fall or experienced change in No Accompanied By: self activities of daily living that may affect Transfer Assistance: None risk of falls: Patient Identification Verified: Yes Signs or symptoms of abuse/neglect since last visito No Secondary Verification Process Completed: Yes Hospitalized since last visit: No Patient Requires Transmission-Based Precautions: No Implantable device outside of the clinic excluding No Patient Has Alerts: Yes cellular tissue based products placed in the center since last visit: Has Dressing in Place as Prescribed: Yes Pain Present Now: No Electronic Signature(s) Signed: 03/10/2021 3:13:54 PM By: Sandre Kitty Entered By: Sandre Kitty on 03/06/2021 10:25:22 -------------------------------------------------------------------------------- Clinic Level of Care Assessment Details Patient Name: Date of Service: [name] [name], [name] [name] 03/06/2021 10:30 A M Medical Record Number: [id number] Patient Account Number: [id number] Date of Birth/Sex: Treating RN: 04-08-1951 (70 y.o. [name] Primary Care Cai Anfinson: [name] Other Clinician: Referring Kearston Putman: Treating Lyndy Russman/Extender: Lanier Clam in Treatment: 6 Clinic Level of Care Assessment Items TOOL 4 Quantity Score '[]'  - 0 Use when only an  EandM is performed on FOLLOW-UP visit ASSESSMENTS - Nursing Assessment / Reassessment X- 1 10 Reassessment of Co-morbidities (includes updates in patient status) X- 1 5 Reassessment of Adherence to Treatment Plan ASSESSMENTS - Wound and Skin A ssessment / Reassessment X - Simple Wound Assessment / Reassessment - one wound 1 5 '[]'  - 0 Complex Wound Assessment / Reassessment - multiple wounds '[]'  - 0 Dermatologic / Skin Assessment (not related to wound area) ASSESSMENTS - Focused Assessment '[]'  - 0 Circumferential Edema Measurements - multi extremities '[]'  - 0 Nutritional Assessment / Counseling / Intervention X- 1 5 Lower Extremity Assessment (monofilament, tuning fork, pulses) '[]'  - 0 Peripheral Arterial Disease Assessment (using hand held doppler) ASSESSMENTS - Ostomy and/or Continence Assessment and Care '[]'  - 0 Incontinence Assessment and Management '[]'  - 0 Ostomy Care Assessment and Management (repouching, etc.) PROCESS - Coordination of Care X - Simple Patient / Family Education for ongoing care 1 15 '[]'  - 0 Complex (extensive) Patient / Family Education for ongoing care X- 1 10 Staff obtains Programmer, systems, Records, T Results / Process Orders est '[]'  - 0 Staff telephones HHA, Nursing Homes / Clarify orders / etc '[]'  - 0 Routine Transfer to another Facility (non-emergent condition) '[]'  - 0 Routine Hospital Admission (non-emergent condition) '[]'  - 0 New Admissions / Biomedical engineer / Ordering NPWT Apligraf, etc. , '[]'  - 0 Emergency Hospital Admission (emergent condition) X- 1 10 Simple Discharge Coordination '[]'  - 0 Complex (extensive) Discharge Coordination PROCESS - Special Needs '[]'  - 0 Pediatric / Minor Patient Management '[]'  - 0 Isolation Patient Management '[]'  - 0 Hearing / Language / Visual special needs '[]'  - 0 Assessment of Community assistance (transportation, D/C planning, etc.) '[]'  - 0 Additional assistance / Altered mentation '[]'  - 0 Support Surface(s)  Assessment (bed, cushion, seat, etc.) INTERVENTIONS - Wound Cleansing / Measurement X - Simple Wound Cleansing - one wound 1 5 '[]'  - 0 Complex Wound Cleansing - multiple wounds  X- 1 5 Wound Imaging (photographs - any number of wounds) '[]'  - 0 Wound Tracing (instead of photographs) X- 1 5 Simple Wound Measurement - one wound '[]'  - 0 Complex Wound Measurement - multiple wounds INTERVENTIONS - Wound Dressings X - Small Wound Dressing one or multiple wounds 1 10 '[]'  - 0 Medium Wound Dressing one or multiple wounds '[]'  - 0 Large Wound Dressing one or multiple wounds '[]'  - 0 Application of Medications - topical '[]'  - 0 Application of Medications - injection INTERVENTIONS - Miscellaneous '[]'  - 0 External ear exam '[]'  - 0 Specimen Collection (cultures, biopsies, blood, body fluids, etc.) '[]'  - 0 Specimen(s) / Culture(s) sent or taken to Lab for analysis '[]'  - 0 Patient Transfer (multiple staff / Civil Service fast streamer / Similar devices) '[]'  - 0 Simple Staple / Suture removal (25 or less) '[]'  - 0 Complex Staple / Suture removal (26 or more) '[]'  - 0 Hypo / Hyperglycemic Management (close monitor of Blood Glucose) '[]'  - 0 Ankle / Brachial Index (ABI) - do not check if billed separately X- 1 5 Vital Signs Has the patient been seen at the hospital within the last three years: Yes Total Score: 90 Level Of Care: New/Established - Level 3 Electronic Signature(s) Signed: 03/06/2021 5:19:35 PM By: Baruch Gouty RN, BSN Entered By: Baruch Gouty on 03/06/2021 11:13:46 -------------------------------------------------------------------------------- Encounter Discharge Information Details Patient Name: Date of Service: [name] Hawthorne B. 03/06/2021 10:30 A M Medical Record Number: [id number] Patient Account Number: [id number] Date of Birth/Sex: Treating RN: Aug 07, 1950 (70 y.o. [name] Primary Care Raima Geathers: [name] Other Clinician: Referring Lasheika Ortloff: Treating Terasa Orsini/Extender: Lanier Clam in Treatment: 6 Encounter Discharge Information Items Discharge Condition: Stable Ambulatory Status: Ambulatory Discharge Destination: Home Transportation: Private Auto Accompanied By: self Schedule Follow-up Appointment: Yes Clinical Summary of Care: Patient Declined Electronic Signature(s) Signed: 03/06/2021 5:19:35 PM By: Baruch Gouty RN, BSN Entered By: Baruch Gouty on 03/06/2021 11:15:05 -------------------------------------------------------------------------------- Lower Extremity Assessment Details Patient Name: Date of Service: [name] [name], [name] [name] 03/06/2021 10:30 A M Medical Record Number: [id number] Patient Account Number: [id number] Date of Birth/Sex: Treating RN: 20-Mar-1951 (70 y.o. [name] Primary Care Tabria Steines: [name] Other Clinician: Referring Kamoni Depree: Treating Enrrique Mierzwa/Extender: Lanier Clam in Treatment: 6 Edema Assessment Assessed: Shirlyn Goltz: No] Patrice Paradise: Yes] Edema: [Left: N] [Right: o] Calf Left: Right: Point of Measurement: From Medial Instep 35 cm Ankle Left: Right: Point of Measurement: From Medial Instep 23 cm Vascular Assessment Pulses: Dorsalis Pedis Palpable: [Right:Yes] Electronic Signature(s) Signed: 03/06/2021 5:23:24 PM By: Lorrin Jackson Entered By: Lorrin Jackson on 03/06/2021 10:31:07 -------------------------------------------------------------------------------- Multi Wound Chart Details Patient Name: Date of Service: [name] Hawthorne B. 03/06/2021 10:30 A M Medical Record Number: [id number] Patient Account Number: [id number] Date of Birth/Sex: Treating RN: [birth year] (70 y.o. [name] Primary Care Briani Maul: [name] Other Clinician: Referring Bhumi Godbey: Treating Yanin Muhlestein/Extender: Lanier Clam in Treatment: 6 Vital Signs Height(in): 78 Capillary Blood Glucose(mg/dl): 132 Weight(lbs): 222 Pulse(bpm): 50 Body Mass  Index(BMI): 29 Blood Pressure(mmHg): 125/64 Temperature(F): 97.9 Respiratory Rate(breaths/min): 18 Photos: [N/A:N/A] Right, Plantar T Great oe N/A N/A Wound Location: Gradually Appeared N/A N/A Wounding Event: Diabetic Wound/Ulcer of the Lower N/A N/A Primary Etiology: Extremity Hypertension, Type II Diabetes, Gout, N/A N/A Comorbid History: Osteomyelitis, Neuropathy 12/11/2020 N/A N/A Date Acquired: 6 N/A N/A Weeks of Treatment: Open N/A N/A Wound Status: 0x0x0 N/A N/A Measurements L x W x D (cm) 0 N/A N/A A (cm) : rea 0  N/A N/A Volume (cm) : 100.00% N/A N/A % Reduction in Area: 100.00% N/A N/A % Reduction in Volume: Grade 3 N/A N/A Classification: Treatment Notes Electronic Signature(s) Signed: 03/06/2021 5:23:24 PM By: Lorrin Jackson Signed: 03/07/2021 7:36:59 AM By: Linton Ham MD Entered By: Linton Ham on 03/06/2021 11:11:46 -------------------------------------------------------------------------------- [place] Details Patient Name: Date of Service: [name] Hawthorne B. 03/06/2021 10:30 A M Medical Record Number: [id number] Patient Account Number: [id number] Date of Birth/Sex: Treating RN: January 29, 1951 (70 y.o. [name] Primary Care Andromeda Poppen: [name] Other Clinician: Referring Cortnie Ringel: Treating Tyonna Talerico/Extender: Lanier Clam in Treatment: 6 Multidisciplinary Care Plan reviewed with physician Active Inactive HBO Nursing Diagnoses: Anxiety related to feelings of confinement associated with the hyperbaric oxygen chamber Anxiety related to knowledge deficit of hyperbaric oxygen therapy and treatment procedures Discomfort related to temperature and humidity changes inside hyperbaric chamber Potential for barotraumas to ears, sinuses, teeth, and lungs or cerebral gas embolism related to changes in atmospheric pressure inside hyperbaric oxygen chamber Potential for oxygen toxicity seizures  related to delivery of 100% oxygen at an increased atmospheric pressure Potential for pulmonary oxygen toxicity related to delivery of 100% oxygen at an increased atmospheric pressure Goals: Barotrauma will be prevented during HBO2 Date Initiated: 01/22/2021 T arget Resolution Date: 03/21/2021 Goal Status: Active Patient and/or family will be able to state/discuss factors appropriate to the management of their disease process during treatment Date Initiated: 01/22/2021 T arget Resolution Date: 03/21/2021 Goal Status: Active Patient will tolerate the hyperbaric oxygen therapy treatment Date Initiated: 01/22/2021 T arget Resolution Date: 03/21/2021 Goal Status: Active Patient will tolerate the internal climate of the chamber Date Initiated: 01/22/2021 T arget Resolution Date: 03/21/2021 Goal Status: Active Patient/caregiver will verbalize understanding of HBO goals, rationale, procedures and potential hazards Date Initiated: 01/22/2021 T arget Resolution Date: 03/21/2021 Goal Status: Active Signs and symptoms of pulmonary oxygen toxicity will be recognized and promptly addressed Date Initiated: 01/22/2021 T arget Resolution Date: 03/21/2021 Goal Status: Active Signs and symptoms of seizure will be recognized and promptly addressed ; seizing patients will suffer no harm Date Initiated: 01/22/2021 T arget Resolution Date: 03/21/2021 Goal Status: Active Interventions: Administer a five (5) minute air break for patient if signs and symptoms of seizure appear and notify the hyperbaric physician Administer decongestants, per physician orders, prior to HBO2 Administer the correct therapeutic gas delivery based on the patients needs and limitations, per physician order Assess and provide for patients comfort related to the hyperbaric environment and equalization of middle ear Assess for signs and symptoms related to adverse events, including but not limited to confinement anxiety, pneumothorax, oxygen toxicity  and baurotrauma Assess patient for any history of confinement anxiety Assess patient's knowledge and expectations regarding hyperbaric medicine and provide education related to the hyperbaric environment, goals of treatment and prevention of adverse events Implement protocols to decrease risk of pneumothorax in high risk patients Notes: Nutrition Nursing Diagnoses: Impaired glucose control: actual or potential Potential for alteratiion in Nutrition/Potential for imbalanced nutrition Goals: Patient/caregiver agrees to and verbalizes understanding of need to use nutritional supplements and/or vitamins as prescribed Date Initiated: 01/22/2021 Date Inactivated: 02/27/2021 Target Resolution Date: 02/21/2021 Goal Status: Met Patient/caregiver will maintain therapeutic glucose control Date Initiated: 01/22/2021 Target Resolution Date: 03/28/2021 Goal Status: Active Interventions: Assess HgA1c results as ordered upon admission and as needed Assess patient nutrition upon admission and as needed per policy Provide education on elevated blood sugars and impact on wound healing Provide education on nutrition Treatment Activities: Education  provided on Nutrition : 01/22/2021 Notes: Wound/Skin Impairment Nursing Diagnoses: Impaired tissue integrity Knowledge deficit related to ulceration/compromised skin integrity Goals: Patient/caregiver will verbalize understanding of skin care regimen Date Initiated: 01/22/2021 Target Resolution Date: 03/28/2021 Goal Status: Active Interventions: Assess patient/caregiver ability to obtain necessary supplies Assess patient/caregiver ability to perform ulcer/skin care regimen upon admission and as needed Assess ulceration(s) every visit Provide education on ulcer and skin care Notes: Electronic Signature(s) Signed: 03/06/2021 5:19:35 PM By: Baruch Gouty RN, BSN Signed: 03/06/2021 5:23:24 PM By: Lorrin Jackson Previous Signature: 03/06/2021 10:01:22 AM  Version By: Lorrin Jackson Entered By: Baruch Gouty on 03/06/2021 11:11:22 -------------------------------------------------------------------------------- Pain Assessment Details Patient Name: Date of Service: [name] Hawthorne B. 03/06/2021 10:30 A M Medical Record Number: [id number] Patient Account Number: [id number] Date of Birth/Sex: Treating RN: [birth year] (70 y.o. [name] Primary Care Alexiss Iturralde: [name] Other Clinician: Referring Armetta Henri: Treating Lai Hendriks/Extender: Lanier Clam in Treatment: 6 Active Problems Location of Pain Severity and Description of Pain Patient Has Paino No Site Locations Pain Management and Medication Current Pain Management: Electronic Signature(s) Signed: 03/06/2021 5:23:24 PM By: Lorrin Jackson Signed: 03/10/2021 3:13:54 PM By: Sandre Kitty Entered By: Sandre Kitty on 03/06/2021 10:25:51 -------------------------------------------------------------------------------- Patient/Caregiver Education Details Patient Name: Date of Service: [name] [name] 8/25/2022andnbsp10:30 A M Medical Record Number: [id number] Patient Account Number: [id number] Date of Birth/Gender: Treating RN: 1950/12/22 (70 y.o. [name] Primary Care Physician: [name] Other Clinician: Referring Physician: Treating Physician/Extender: Lanier Clam in Treatment: 6 Education Assessment Education Provided To: Patient Education Topics Provided Elevated Blood Sugar/ Impact on Healing: Methods: Explain/Verbal Responses: State content correctly Hyperbaric Oxygenation: Methods: Explain/Verbal Responses: State content correctly Wound/Skin Impairment: Methods: Demonstration, Explain/Verbal, Printed Responses: State content correctly Electronic Signature(s) Signed: 03/06/2021 5:23:24 PM By: Lorrin Jackson Entered By: Lorrin Jackson on 03/06/2021  10:01:49 -------------------------------------------------------------------------------- Wound Assessment Details Patient Name: Date of Service: [name] Hawthorne B. 03/06/2021 10:30 A M Medical Record Number: [id number] Patient Account Number: [id number] Date of Birth/Sex: Treating RN: Jan 28, 1951 (70 y.o. [name] Primary Care Mckinnley Smithey: [name] Other Clinician: Referring Paddy Walthall: Treating Denman Pichardo/Extender: Lanier Clam in Treatment: 6 Wound Status Wound Number: 3 Primary Diabetic Wound/Ulcer of the Lower Extremity Etiology: Wound Location: Right, Plantar T Great oe Wound Status: Open Wounding Event: Gradually Appeared Comorbid Hypertension, Type II Diabetes, Gout, Osteomyelitis, Date Acquired: 12/11/2020 History: Neuropathy Weeks Of Treatment: 6 Clustered Wound: No Photos Wound Measurements Length: (cm) Width: (cm) Depth: (cm) Area: (cm) Volume: (cm) 0 % Reduction in Area: 100% 0 % Reduction in Volume: 100% 0 0 0 Wound Description Classification: Grade 3 Treatment Notes Wound #3 (Toe Great) Wound Laterality: Plantar, Right Cleanser Peri-Wound Care Topical Primary Dressing Secondary Dressing Secured With Compression Wrap Compression Stockings Add-Ons Electronic Signature(s) Signed: 03/06/2021 5:23:24 PM By: Lorrin Jackson Entered By: Lorrin Jackson on 03/06/2021 10:31:18 -------------------------------------------------------------------------------- Vitals Details Patient Name: Date of Service: [name] Hawthorne B. 03/06/2021 10:30 A M Medical Record Number: [id number] Patient Account Number: [id number] Date of Birth/Sex: Treating RN: 12-13-1950 (70 y.o. [name] Primary Care Kaliyah Gladman: [name] Other Clinician: Donavan Burnet Referring Tyvon Eggenberger: Treating Bishoy Cupp/Extender: Lanier Clam in Treatment: 6 Vital Signs Time Taken: 10:17 Temperature (F): 97.9 Height (in):  73 Pulse (bpm): 52 Weight (lbs): 222 Respiratory Rate (breaths/min): 18 Body Mass Index (BMI): 29.3 Blood Pressure (mmHg): 125/64 Capillary Blood Glucose (mg/dl): 132 Reference Range: 80 - 120 mg / dl Electronic Signature(s) Signed: 03/10/2021 3:13:54 PM By:  Dawkins, Destiny Entered By: Sandre Kitty on 03/06/2021 10:25:45

## 2021-03-06 NOTE — Progress Notes (Signed)
SHENAN, TIPPLE ([id number]) Visit Report for 03/05/2021 [place] Details Patient Name: Date of Service: [name] [name], [name] [name] 03/05/2021 Medical Record Number: [id number] Patient Account Number: [id number] Date of Birth/Sex: Treating RN: [birth year] (70 y.o. [name] Primary Care Provider: [name] Other Clinician: Donavan Burnet Referring Provider: Treating Provider/Extender: Tally Joe in Treatment: 6 Diagnosis Coding ICD-10 Codes Code Description [phone] Type 2 diabetes mellitus with foot ulcer L97.518 Non-pressure chronic ulcer of other part of right foot with other specified severity L97.511 Non-pressure chronic ulcer of other part of right foot limited to breakdown of skin M86.371 Chronic multifocal osteomyelitis, right ankle and foot E11.621 Type 2 diabetes mellitus with foot ulcer Facility Procedures CPT4 Code Description Modifier Quantity [id number]-(Facility Use Only) HBOT full body chamber, 108mn , 4 ICD-10 Diagnosis Description E11.621 Type 2 diabetes mellitus with foot ulcer L97.518 Non-pressure chronic ulcer of other part of right foot with other specified severity L97.511 Non-pressure chronic ulcer of other part of right foot limited to breakdown of skin M86.371 Chronic multifocal osteomyelitis, right ankle and foot Physician Procedures Quantity CPT4 Code Description Modifier [id number] PHYS HYPERBARIC OXYGEN THERAPY 1 ICD-10 Diagnosis Description E11.621 Type 2 diabetes mellitus with foot ulcer L97.518 Non-pressure chronic ulcer of other part of right foot with other specified severity L97.511 Non-pressure chronic ulcer of other part of right foot limited to breakdown of skin M86.371 Chronic multifocal osteomyelitis, right ankle and foot Electronic Signature(s) Signed: 03/05/2021 6:37:42 PM By: SWorthy KeelerPA-C Signed: 03/06/2021 10:34:15 AM By: SDonavan BurnetEMT Entered By: SDonavan Burneton  03/05/2021 11:14:47

## 2021-03-07 ENCOUNTER — Encounter (HOSPITAL_BASED_OUTPATIENT_CLINIC_OR_DEPARTMENT_OTHER): Payer: Medicare HMO | Admitting: Internal Medicine

## 2021-03-07 ENCOUNTER — Other Ambulatory Visit: Payer: Self-pay

## 2021-03-07 DIAGNOSIS — E1140 Type 2 diabetes mellitus with diabetic neuropathy, unspecified: Secondary | ICD-10-CM | POA: Diagnosis not present

## 2021-03-07 DIAGNOSIS — L97511 Non-pressure chronic ulcer of other part of right foot limited to breakdown of skin: Secondary | ICD-10-CM

## 2021-03-07 DIAGNOSIS — M86371 Chronic multifocal osteomyelitis, right ankle and foot: Secondary | ICD-10-CM

## 2021-03-07 DIAGNOSIS — G4733 Obstructive sleep apnea (adult) (pediatric): Secondary | ICD-10-CM | POA: Diagnosis not present

## 2021-03-07 DIAGNOSIS — L97518 Non-pressure chronic ulcer of other part of right foot with other specified severity: Secondary | ICD-10-CM | POA: Diagnosis not present

## 2021-03-07 DIAGNOSIS — E1151 Type 2 diabetes mellitus with diabetic peripheral angiopathy without gangrene: Secondary | ICD-10-CM | POA: Diagnosis not present

## 2021-03-07 DIAGNOSIS — E1169 Type 2 diabetes mellitus with other specified complication: Secondary | ICD-10-CM | POA: Diagnosis not present

## 2021-03-07 DIAGNOSIS — Z8589 Personal history of malignant neoplasm of other organs and systems: Secondary | ICD-10-CM | POA: Diagnosis not present

## 2021-03-07 DIAGNOSIS — E1122 Type 2 diabetes mellitus with diabetic chronic kidney disease: Secondary | ICD-10-CM | POA: Diagnosis not present

## 2021-03-07 DIAGNOSIS — I129 Hypertensive chronic kidney disease with stage 1 through stage 4 chronic kidney disease, or unspecified chronic kidney disease: Secondary | ICD-10-CM | POA: Diagnosis not present

## 2021-03-07 DIAGNOSIS — N1831 Chronic kidney disease, stage 3a: Secondary | ICD-10-CM | POA: Diagnosis not present

## 2021-03-07 DIAGNOSIS — E11621 Type 2 diabetes mellitus with foot ulcer: Secondary | ICD-10-CM | POA: Diagnosis not present

## 2021-03-07 DIAGNOSIS — I252 Old myocardial infarction: Secondary | ICD-10-CM | POA: Diagnosis not present

## 2021-03-07 DIAGNOSIS — I2510 Atherosclerotic heart disease of native coronary artery without angina pectoris: Secondary | ICD-10-CM | POA: Diagnosis not present

## 2021-03-07 DIAGNOSIS — Z7984 Long term (current) use of oral hypoglycemic drugs: Secondary | ICD-10-CM | POA: Diagnosis not present

## 2021-03-07 LAB — GLUCOSE, CAPILLARY
Glucose-Capillary: 144 mg/dL — ABNORMAL HIGH (ref 70–99)
Glucose-Capillary: 116 mg/dL — ABNORMAL HIGH (ref 70–99)

## 2021-03-07 NOTE — Progress Notes (Signed)
[name] [name], [name] [name] ([id number]) Visit Report for 03/06/2021 HPI Details Patient Name: Date of Service: [name] [name], [name] [name] 03/06/2021 10:30 A M Medical Record Number: [id number] Patient Account Number: [id number] Date of Birth/Sex: Treating RN: 05-May-1951 (70 y.o. [name] Primary Care Provider: [name] Other Clinician: Referring Provider: Treating Provider/Extender: Lanier Clam in Treatment: 6 History of Present Illness HPI Description: ADMISSION 01/22/2021 This is a 70 year old man with type 2 diabetes and diabetic neuropathy on metformin. He has been referred to our clinic from the wound care center in [facility] by Dr. Adelina Mings for consideration of hyperbaric oxygen therapy. Looking through [facility] health link the patient has been followed by Dr. Rocco Serene since the fall of last year at which time he had a Wagner grade 2 wound on the plantar aspect of the right great toe. This deteriorated according to the patient in June when the toe began rapidly more swollen. Ultimately he required admission to hospital from 01/01/2021 through 01/07/2021 at which time he had streaking erythema up his foot. He had an MRI done on 12/26/2020 which showed medial plantar ulceration of the great toe to bone with diffuse soft tissue swelling of the great toe with enhancement. No fluid collection or hematoma there was osteomyelitis of the first proximal and distal phalanx no abscess. He also had small erosions of the first metatarsal head and first TMT joint suspicious for gout. Cultures grew group B strep and Prevotella. He was treated with IV Invanz and p.o. ciprofloxacin. In fact he was just stopped of the ciprofloxacin by infectious disease today although he remains on IV Invanz until the beginning of August. He is tolerating this well. There is been marked improvement in his wounds and he was referred for consideration of additional hyperbaric  oxygen therapy. 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 Surgically removed in 2018 ABIs were done on 05/06/2020 which showed noncompressible waveforms ABIs were biphasic at the ankles bilaterally. TBI's were not done 01/29/2021; patient is here for follow-up from his admission last week. In the interim he started HBO yesterday which he tolerated well. He is still on his IV antibiotics [Invanz]. We have been using endoform. He has wounds on the right great toe x3 and an abrasion injury on the dorsal right third toe 8/18; he has completed 18 hyperbaric treatments. This is out of 40. He has completed his Invanz as of about 2 weeks ago PICC line was removed. The only wound remaining on his right first toe was on the plantar aspect. The lateral and and dorsal wounds are closed 8/26; patient has completed 24 treatments. Wound at 0.1 x 0.1 x 0.1. Only remaining area on the plantar aspect of the right great toe. Much improved Electronic Signature(s) Signed: 03/07/2021 7:36:59 AM By: Linton Ham MD Entered By: Linton Ham on 03/06/2021 11:12:47 -------------------------------------------------------------------------------- Physical Exam Details Patient Name: Date of Service: [name] Hawthorne B. 03/06/2021 10:30 A M Medical Record Number: [id number] Patient Account Number: [id number] Date of Birth/Sex: Treating RN: October 08, 1950 (70 y.o. [name] Primary Care Provider: [name] Other Clinician: Referring Provider: Treating Provider/Extender: Lanier Clam in Treatment: 6 Constitutional Sitting or standing Blood Pressure is within target range for patient.. Pulse regular and within target range for patient.Marland Kitchen Respirations regular,  non-labored and  within target range.. Temperature is normal and within the target range for the patient.Marland Kitchen Appears in no distress. Notes Wound exam; everything is almost closed down. Still some debris on the surface of the plantar right first toe but this is much better than last time. No evidence of infection Electronic Signature(s) Signed: 03/07/2021 7:36:59 AM By: Linton Ham MD Entered By: Linton Ham on 03/06/2021 11:15:02 -------------------------------------------------------------------------------- Physician Orders Details Patient Name: Date of Service: [name] Hawthorne B. 03/06/2021 10:30 A M Medical Record Number: [id number] Patient Account Number: [id number] Date of Birth/Sex: Treating RN: [birth year] (70 y.o. [name] Primary Care Provider: [name] Other Clinician: Referring Provider: Treating Provider/Extender: Lanier Clam in Treatment: 6 Verbal / Phone Orders: No Diagnosis Coding ICD-10 Coding Code Description E11.621 Type 2 diabetes mellitus with foot ulcer L97.518 Non-pressure chronic ulcer of other part of right foot with other specified severity L97.511 Non-pressure chronic ulcer of other part of right foot limited to breakdown of skin M86.371 Chronic multifocal osteomyelitis, right ankle and foot E11.621 Type 2 diabetes mellitus with foot ulcer Follow-up Appointments ppointment in 1 week. - with Dr. Dellia Nims Return A Bathing/ Shower/ Hygiene May shower and wash wound with soap and water. - prior to dressing change Off-Loading Open toe surgical shoe to: - right foot Hyperbaric Oxygen Therapy Evaluate for HBO Therapy Indication: - Wagner 3 diabetic ulcer right great toe If appropriate for treatment, begin HBOT per protocol: 2.5 ATA for 90 Minutes with 2 Five (5) Minute A Breaks ir Total Number of Treatments: - 40 One treatments per day (delivered Monday through Friday unless otherwise specified in Special  Instructions below): Finger stick Blood Glucose Pre- and Post- HBOT Treatment. Follow Hyperbaric Oxygen Glycemia Protocol A frin (Oxymetazoline HCL) 0.05% nasal spray - 1 spray in both nostrils daily as needed prior to HBO treatment for difficulty clearing ears Wound Treatment Wound #3 - T Great oe Wound Laterality: Plantar, Right Cleanser: Soap and Water 1 x Per Day/7 Days Discharge Instructions: May shower and wash wound with dial antibacterial soap and water prior to dressing change. Secondary Dressing: Optifoam Non-Adhesive Dressing, 4x4 in 1 x Per Day/7 Days Discharge Instructions: cut to make foam donut to offload Secured With: Conforming Stretch Gauze Bandage, Sterile 2x75 (in/in) 1 x Per Day/7 Days Discharge Instructions: Secure with stretch gauze as directed. GLYCEMIA INTERVENTIONS PROTOCOL PRE-HBO GLYCEMIA INTERVENTIONS ACTION INTERVENTION Obtain pre-HBO capillary blood glucose (ensure 1 physician order is in chart). A. Notify HBO physician and await physician orders. 2 If result is 70 mg/dl or below: B. If the result meets the hospital definition of a critical result, follow hospital policy. A. Give patient an 8 ounce Glucerna Shake, an 8 ounce Ensure, or 8 ounces of a Glucerna/Ensure equivalent dietary supplement*. B. Wait 30 minutes. If result is 71 mg/dl to 130 mg/dl: C. Retest patients capillary blood glucose (CBG). D. If result greater than or equal to 110 mg/dl, proceed with HBO. If result less than 110 mg/dl, notify HBO physician and consider holding HBO. If result is 131 mg/dl to 249 mg/dl: A. Proceed with HBO. A. Notify HBO physician and await physician orders. B. It is recommended to hold HBO and do If result is 250 mg/dl or greater: blood/urine ketone testing. C. If the result meets the hospital definition of a critical result, follow hospital policy. POST-HBO GLYCEMIA INTERVENTIONS ACTION INTERVENTION Obtain post HBO capillary blood glucose  (ensure 1 physician order is in chart). A. Notify HBO physician and await physician orders.  2 If result is 70 mg/dl or below: B. If the result meets the hospital definition of a critical result, follow hospital policy. A. Give patient an 8 ounce Glucerna Shake, an 8 ounce Ensure, or 8 ounces of a Glucerna/Ensure equivalent dietary supplement*. B. Wait 15 minutes for symptoms of If result is 71 mg/dl to 100 mg/dl: hypoglycemia (i.e. nervousness, anxiety, sweating, chills, clamminess, irritability, confusion, tachycardia or dizziness). C. If patient asymptomatic, discharge patient. If patient symptomatic, repeat capillary blood glucose (CBG) and notify HBO physician. If result is 101 mg/dl to 249 mg/dl: A. Discharge patient. A. Notify HBO physician and await physician orders. B. It is recommended to do blood/urine ketone If result is 250 mg/dl or greater: testing. C. If the result meets the hospital definition of a critical result, follow hospital policy. *Juice or candies are NOT equivalent products. If patient refuses the Glucerna or Ensure, please consult the hospital dietitian for an appropriate substitute. Electronic Signature(s) Signed: 03/06/2021 5:19:35 PM By: Baruch Gouty RN, BSN Signed: 03/07/2021 7:36:59 AM By: Linton Ham MD Entered By: Baruch Gouty on 03/06/2021 11:08:00 -------------------------------------------------------------------------------- Problem List Details Patient Name: Date of Service: [name] Hawthorne B. 03/06/2021 10:30 A M Medical Record Number: [id number] Patient Account Number: [id number] Date of Birth/Sex: Treating RN: 1950-09-11 (70 y.o. [name] Primary Care Provider: [name] Other Clinician: Referring Provider: Treating Provider/Extender: Lanier Clam in Treatment: 6 Active Problems ICD-10 Encounter Code Description Active Date MDM Diagnosis E11.621 Type 2 diabetes mellitus with  foot ulcer 01/22/2021 No Yes L97.518 Non-pressure chronic ulcer of other part of right foot with other specified 01/22/2021 No Yes severity L97.511 Non-pressure chronic ulcer of other part of right foot limited to breakdown of 01/22/2021 No Yes skin M86.371 Chronic multifocal osteomyelitis, right ankle and foot 01/22/2021 No Yes E11.621 Type 2 diabetes mellitus with foot ulcer 01/22/2021 No Yes Inactive Problems Resolved Problems Electronic Signature(s) Signed: 03/07/2021 7:36:59 AM By: Linton Ham MD Previous Signature: 03/06/2021 10:01:01 AM Version By: Lorrin Jackson Entered By: Linton Ham on 03/06/2021 11:11:22 -------------------------------------------------------------------------------- Progress Note Details Patient Name: Date of Service: [name] Hawthorne B. 03/06/2021 10:30 A M Medical Record Number: [id number] Patient Account Number: [id number] Date of Birth/Sex: Treating RN: 22-Feb-1951 (70 y.o. [name] Primary Care Provider: [name] Other Clinician: Referring Provider: Treating Provider/Extender: Lanier Clam in Treatment: 6 Subjective History of Present Illness (HPI) ADMISSION 01/22/2021 This is a 70 year old man with type 2 diabetes and diabetic neuropathy on metformin. He has been referred to our clinic from the wound care center in [facility] by Dr. Adelina Mings for consideration of hyperbaric oxygen therapy. Looking through [facility] health link the patient has been followed by Dr. Rocco Serene since the fall of last year at which time he had a Wagner grade 2 wound on the plantar aspect of the right great toe. This deteriorated according to the patient in June when the toe began rapidly more swollen. Ultimately he required admission to hospital from 01/01/2021 through 01/07/2021 at which time he had streaking erythema up his foot. He had an MRI done on 12/26/2020 which showed medial plantar ulceration of the great toe to bone  with diffuse soft tissue swelling of the great toe with enhancement. No fluid collection or hematoma there was osteomyelitis of the first proximal and distal phalanx no abscess. He also had small erosions of the first metatarsal head and first TMT joint suspicious for gout. Cultures grew group B strep and Prevotella. He  was treated with IV Invanz and p.o. ciprofloxacin. In fact he was just stopped of the ciprofloxacin by infectious disease today although he remains on IV Invanz until the beginning of August. He is tolerating this well. There is been marked improvement in his wounds and he was referred for consideration of additional hyperbaric oxygen therapy. 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 Surgically removed in 2018 ABIs were done on 05/06/2020 which showed noncompressible waveforms ABIs were biphasic at the ankles bilaterally. TBI's were not done 01/29/2021; patient is here for follow-up from his admission last week. In the interim he started HBO yesterday which he tolerated well. He is still on his IV antibiotics [Invanz]. We have been using endoform. He has wounds on the right great toe x3 and an abrasion injury on the dorsal right third toe 8/18; he has completed 18 hyperbaric treatments. This is out of 40. He has completed his Invanz as of about 2 weeks ago PICC line was removed. The only wound remaining on his right first toe was on the plantar aspect. The lateral and and dorsal wounds are closed 8/26; patient has completed 24 treatments. Wound at 0.1 x 0.1 x 0.1. Only remaining area on the plantar aspect of the right great toe. Much improved Objective Constitutional Sitting or standing Blood Pressure is within target range for patient.. Pulse regular and  within target range for patient.Marland Kitchen Respirations regular, non-labored and within target range.. Temperature is normal and within the target range for the patient.Marland Kitchen Appears in no distress. Vitals Time Taken: 10:17 AM, Height: 73 in, Weight: 222 lbs, BMI: 29.3, Temperature: 97.9 F, Pulse: 52 bpm, Respiratory Rate: 18 breaths/min, Blood Pressure: 125/64 mmHg, Capillary Blood Glucose: 132 mg/dl. General Notes: Wound exam; everything is almost closed down. Still some debris on the surface of the plantar right first toe but this is much better than last time. No evidence of infection Integumentary (Hair, Skin) Wound #3 status is Open. Original cause of wound was Gradually Appeared. The date acquired was: 12/11/2020. The wound has been in treatment 6 weeks. The wound is located on the [employer]. The wound measures 0cm length x 0cm width x 0cm depth; 0cm^2 area and 0cm^3 volume. oe Assessment Active Problems ICD-10 Type 2 diabetes mellitus with foot ulcer Non-pressure chronic ulcer of other part of right foot with other specified severity Non-pressure chronic ulcer of other part of right foot limited to breakdown of skin Chronic multifocal osteomyelitis, right ankle and foot Type 2 diabetes mellitus with foot ulcer Plan Follow-up Appointments: Return Appointment in 1 week. - with Dr. Dellia Nims Bathing/ Shower/ Hygiene: May shower and wash wound with soap and water. - prior to dressing change Off-Loading: Open toe surgical shoe to: - right foot Hyperbaric Oxygen Therapy: Evaluate for HBO Therapy Indication: - Wagner 3 diabetic ulcer right great toe If appropriate for treatment, begin HBOT per protocol: 2.5 ATA for 90 Minutes with 2 Five (5) Minute Air Breaks T Number of Treatments: - 40 otal One treatments per day (delivered Monday through Friday unless otherwise specified in Special Instructions below): Finger stick Blood Glucose Pre- and Post- HBOT Treatment. Follow Hyperbaric  Oxygen Glycemia Protocol Afrin (Oxymetazoline HCL) 0.05% nasal spray - 1 spray in both nostrils daily as needed prior to HBO treatment for difficulty clearing ears WOUND #3: -  T Great Wound Laterality: Plantar, Right oe Cleanser: Soap and Water 1 x Per Day/7 Days Discharge Instructions: May shower and wash wound with dial antibacterial soap and water prior to dressing change. Secondary Dressing: Optifoam Non-Adhesive Dressing, 4x4 in 1 x Per Day/7 Days Discharge Instructions: cut to make foam donut to offload Secured With: Conforming Stretch Gauze Bandage, Sterile 2x75 (in/in) 1 x Per Day/7 Days Discharge Instructions: Secure with stretch gauze as directed. 1. The patient was seen for wound care evaluation in conjunction with his HBO treatment today which he tolerated well 2. Marked improvement in the condition of the right first toe everything is almost totally closed down 3. He only has the pad the surface of the plantar aspect of the right great toe. No need for silver alginate at this point 4. Gauze wrap to protect it. 5. I have asked him to bring in the shoe he is going to wear. He apparently has diabetic inserts Electronic Signature(s) Signed: 03/07/2021 7:36:59 AM By: Linton Ham MD Entered By: Linton Ham on 03/06/2021 11:15:59 -------------------------------------------------------------------------------- SuperBill Details Patient Name: Date of Service: [name] [name] 03/06/2021 Medical Record Number: [id number] Patient Account Number: [id number] Date of Birth/Sex: Treating RN: 02-May-1951 (71 y.o. [name] Primary Care Provider: [name] Other Clinician: Referring Provider: Treating Provider/Extender: Lanier Clam in Treatment: 6 Diagnosis Coding ICD-10 Codes Code Description [phone] Type 2 diabetes mellitus with foot ulcer L97.518 Non-pressure chronic ulcer of other part of right foot with other specified severity L97.511  Non-pressure chronic ulcer of other part of right foot limited to breakdown of skin M86.371 Chronic multifocal osteomyelitis, right ankle and foot E11.621 Type 2 diabetes mellitus with foot ulcer Facility Procedures CPT4 Code: [id number] Description: 99213 - WOUND CARE VISIT-LEV 3 EST PT Modifier: Quantity: 1 Electronic Signature(s) Signed: 03/07/2021 7:36:59 AM By: Linton Ham MD Entered By: Linton Ham on 03/06/2021 11:16:10

## 2021-03-07 NOTE — Progress Notes (Signed)
BURNEST, BRESS ([id number]) Visit Report for 03/06/2021 SuperBill Details Patient Name: Date of Service: [name] [name], [name] [name] 03/06/2021 Medical Record Number: [id number] Patient Account Number: [id number] Date of Birth/Sex: Treating RN: 30-Dec-1950 (70 y.o. [name] Primary Care Provider: [name] Other Clinician: Donavan Burnet Referring Provider: Treating Provider/Extender: Lanier Clam in Treatment: 6 Diagnosis Coding ICD-10 Codes Code Description [phone] Type 2 diabetes mellitus with foot ulcer L97.518 Non-pressure chronic ulcer of other part of right foot with other specified severity L97.511 Non-pressure chronic ulcer of other part of right foot limited to breakdown of skin M86.371 Chronic multifocal osteomyelitis, right ankle and foot E11.621 Type 2 diabetes mellitus with foot ulcer Facility Procedures CPT4 Code Description Modifier Quantity [id number]-(Facility Use Only) HBOT full body chamber, 73mn , 4 ICD-10 Diagnosis Description E11.621 Type 2 diabetes mellitus with foot ulcer L97.511 Non-pressure chronic ulcer of other part of right foot limited to breakdown of skin L97.518 Non-pressure chronic ulcer of other part of right foot with other specified severity M86.371 Chronic multifocal osteomyelitis, right ankle and foot Physician Procedures Quantity CPT4 Code Description Modifier [id number] PHYS HYPERBARIC OXYGEN THERAPY 1 ICD-10 Diagnosis Description E11.621 Type 2 diabetes mellitus with foot ulcer L97.518 Non-pressure chronic ulcer of other part of right foot with other specified severity L97.511 Non-pressure chronic ulcer of other part of right foot limited to breakdown of skin M86.371 Chronic multifocal osteomyelitis, right ankle and foot Electronic Signature(s) Signed: 03/06/2021 5:02:32 PM By: SDonavan BurnetEMT Signed: 03/07/2021 7:36:59 AM By: RLinton HamMD Entered By: SDonavan Burneton  03/06/2021 11:51:23

## 2021-03-07 NOTE — Progress Notes (Signed)
[name] [name], [name] [name] ([id number]) Visit Report for 03/07/2021 SuperBill Details Patient Name: Date of Service: [name] [name], [name] [name] 03/07/2021 Medical Record Number: [id number] Patient Account Number: [id number] Date of Birth/Sex: Treating RN: [birth year] (70 y.o. [name] Primary Care Provider: [name] Other Clinician: Donavan Burnet Referring Provider: Treating Provider/Extender: Oren Section in Treatment: 6 Diagnosis Coding ICD-10 Codes Code Description [phone] Type 2 diabetes mellitus with foot ulcer L97.518 Non-pressure chronic ulcer of other part of right foot with other specified severity L97.511 Non-pressure chronic ulcer of other part of right foot limited to breakdown of skin M86.371 Chronic multifocal osteomyelitis, right ankle and foot E11.621 Type 2 diabetes mellitus with foot ulcer Facility Procedures CPT4 Code Description Modifier Quantity [id number] G0277-(Facility Use Only) HBOT full body chamber, 108mn , 4 ICD-10 Diagnosis Description E11.621 Type 2 diabetes mellitus with foot ulcer L97.518 Non-pressure chronic ulcer of other part of right foot with other specified severity L97.511 Non-pressure chronic ulcer of other part of right foot limited to breakdown of skin M86.371 Chronic multifocal osteomyelitis, right ankle and foot Physician Procedures Quantity CPT4 Code Description Modifier [id number] PHYS HYPERBARIC OXYGEN THERAPY 1 ICD-10 Diagnosis Description E11.621 Type 2 diabetes mellitus with foot ulcer L97.518 Non-pressure chronic ulcer of other part of right foot with other specified severity L97.511 Non-pressure chronic ulcer of other part of right foot limited to breakdown of skin M86.371 Chronic multifocal osteomyelitis, right ankle and foot Electronic Signature(s) Signed: 03/07/2021 12:03:59 PM By: SDonavan BurnetEMT Signed: 03/07/2021 12:12:33 PM By: HKalman ShanDO Entered By: SDonavan Burneton  03/07/2021 11:09:19

## 2021-03-07 NOTE — Progress Notes (Signed)
[name], [name] ([id number]) Visit Report for 03/07/2021 HBO Details Patient Name: Date of Service: [name], [name] 03/07/2021 8:00 A M Medical Record Number: [id number] Patient Account Number: [id number] Date of Birth/Sex: Treating RN: 1951-06-05 (70 y.o. [name] [name] Primary Care [name] [name]: [name] [name] Other Clinician: Donavan [name] Referring [name] [name]: Treating [name] [name]/Extender: [name] [name] in Treatment: 6 HBO Treatment Course Details Treatment Course Number: 1 Ordering [name] [name]: [name] [name] T Treatments Ordered: otal 40 HBO Treatment Start Date: 01/27/2021 HBO Indication: Diabetic Ulcer(s) of the Lower Extremity HBO Treatment Details Treatment Number: 25 Patient Type: Outpatient Chamber Type: Monoplace Chamber Serial #: [id number] Treatment Protocol: 2.5 ATA with 90 minutes oxygen, with two 5 minute air breaks Treatment Details Compression Rate Down: 2.0 psi / minute De-Compression Rate Up: 2.5 psi / minute A breaks and breathing ir Compress Tx Pressure periods Decompress Decompress Begins Reached (leave unused spaces Begins Ends blank) Chamber Pressure (ATA 1 2.5 2.5 2.5 2.5 2.5 - - 2.5 1 ) Clock Time (24 hr) 08:24 08:36 09:06 09:11 09:41 09:46 - - 10:16 10:25 Treatment Length: 121 (minutes) Treatment Segments: 4 Vital Signs Capillary Blood Glucose Reference Range: 80 - 120 mg / dl HBO Diabetic Blood Glucose Intervention Range: <131 mg/dl or >249 mg/dl Type: Time Vitals Blood Pulse: Respiratory Capillary Blood Glucose Pulse Action Temperature: Taken: Pressure: Rate: Glucose (mg/dl): Meter #: Oximetry (%) Taken: Pre 08:05 124/54 53 18 97.7 144 Post 10:30 137/60 47 18 97.6 116 discharge per protocol Treatment Response Treatment Toleration: Well Treatment Completion Status: Treatment Completed without Adverse Event Additional Procedure Documentation Tissue Sevierity: Limited to breakdown of skin Physician HBO  Attestation: I certify that I supervised this HBO treatment in accordance with Medicare guidelines. A trained emergency response team is readily available per Yes hospital policies and procedures. Continue HBOT as ordered. Yes Electronic Signature(s) Signed: 03/07/2021 12:12:33 PM By: [name] Shan DO Entered By: [name] [name] on 03/07/2021 12:00:17 -------------------------------------------------------------------------------- HBO Safety Checklist Details Patient Name: Date of Service: [name], [name] 03/07/2021 8:00 A M Medical Record Number: [id number] Patient Account Number: [id number] Date of Birth/Sex: Treating RN: [birth year] (70 y.o. [name] [name] Primary Care [name] [name]: [name] [name] Other Clinician: Donavan [name] Referring [name] [name]: Treating [name] [name]/Extender: [name] [name] in Treatment: 6 HBO Safety Checklist Items Safety Checklist Consent Form Signed Patient voided / foley secured and emptied When did you last eato 0700 Last dose of injectable or oral agent metformin after yesterday's HBO Tx Ostomy pouch emptied and vented if applicable NA All implantable devices assessed, documented and approved NA Intravenous access site secured and place NA Valuables secured Linens and cotton and cotton/polyester blend (less than 51% polyester) Personal oil-based products / skin lotions / body lotions removed Wigs or hairpieces removed NA Smoking or tobacco materials removed NA Books / newspapers / magazines / loose paper removed Cologne, aftershave, perfume and deodorant removed Jewelry removed ([name] wrap wedding band) Make-up removed NA Hair care products removed NA Battery operated devices (external) removed Heating patches and chemical warmers removed Titanium eyewear removed Nail polish cured greater than 10 hours NA Casting material cured greater than 10 hours NA Hearing aids removed NA Loose dentures or partials  removed NA Prosthetics have been removed NA Patient demonstrates correct use of air break device (if applicable) Patient concerns have been addressed Patient grounding bracelet on and cord attached to chamber Specifics for Inpatients (complete in addition to above) Medication sheet sent with patient NA Intravenous medications needed or due during therapy  sent with patient NA Drainage tubes (e.g. nasogastric tube or chest tube secured and vented) NA Endotracheal or Tracheotomy tube secured NA Cuff deflated of air and inflated with saline NA Airway suctioned NA Electronic Signature(s) Signed: 03/07/2021 12:03:59 PM By: [name] [name] EMT Entered By: [name] [name] on 03/07/2021 08:16:41

## 2021-03-07 NOTE — Progress Notes (Signed)
[name] [name], [name] [name] ([id number]) Visit Report for 03/06/2021 HBO Details Patient Name: Date of Service: [name] [name], [name] [name] 03/06/2021 8:00 A M Medical Record Number: [id number] Patient Account Number: [id number] Date of Birth/Sex: Treating RN: May 11, 1951 (70 y.o. [name] Primary Care Acie Custis: [name] Other Clinician: Donavan Burnet Referring Bronwen Pendergraft: Treating Leomia Blake/Extender: Lanier Clam in Treatment: 6 HBO Treatment Course Details Treatment Course Number: 1 Ordering Symphany Fleissner: Bernerd Pho Treatments Ordered: otal 40 HBO Treatment Start Date: 01/27/2021 HBO Indication: Diabetic Ulcer(s) of the Lower Extremity HBO Treatment Details Treatment Number: 24 Patient Type: Outpatient Chamber Type: Monoplace Chamber Serial #: [id number] Treatment Protocol: 2.5 ATA with 90 minutes oxygen, with two 5 minute air breaks Treatment Details Compression Rate Down: 2.0 psi / minute De-Compression Rate Up: 2.5 psi / minute A breaks and breathing ir Compress Tx Pressure periods Decompress Decompress Begins Reached (leave unused spaces Begins Ends blank) Chamber Pressure (ATA 1 2.5 2.5 2.5 2.5 2.5 - - 2.5 1 ) Clock Time (24 hr) 08:06 08:18 08:48 08:53 09:23 09:28 - - 09:58 10:07 Treatment Length: 121 (minutes) Treatment Segments: 4 Vital Signs Capillary Blood Glucose Reference Range: 80 - 120 mg / dl HBO Diabetic Blood Glucose Intervention Range: <131 mg/dl or >249 mg/dl Time Vitals Blood Respiratory Capillary Blood Glucose Pulse Action Type: Pulse: Temperature: Taken: Pressure: Rate: Glucose (mg/dl): Meter #: Oximetry (%) Taken: Pre 07:59 130/65 65 18 98.2 172 Post 10:17 125/64 52 18 97.9 132 Treatment Response Treatment Toleration: Well Treatment Completion Status: Treatment Completed without Adverse Event Additional Procedure Documentation Tissue Sevierity: Limited to breakdown of skin Dimple Bastyr Notes No concerns with treatment  given. Patient was also seen for wound care review Physician HBO Attestation: I certify that I supervised this HBO treatment in accordance with Medicare guidelines. A trained emergency response team is readily available per Yes hospital policies and procedures. Continue HBOT as ordered. Yes Electronic Signature(s) Signed: 03/07/2021 7:36:59 AM By: Linton Ham MD Entered By: Linton Ham on 03/06/2021 16:17:01 -------------------------------------------------------------------------------- HBO Safety Checklist Details Patient Name: Date of Service: [name] Hawthorne B. 03/06/2021 8:00 A M Medical Record Number: [id number] Patient Account Number: [id number] Date of Birth/Sex: Treating RN: 07-Aug-1950 (70 y.o. [name] Primary Care Iyahna Obriant: [name] Other Clinician: Donavan Burnet Referring Sallie Staron: Treating Astha Probasco/Extender: Lanier Clam in Treatment: 6 HBO Safety Checklist Items Safety Checklist Consent Form Signed Patient voided / foley secured and emptied When did you last eato 0700 Last dose of injectable or oral agent Yesterday after HBO Tx Ostomy pouch emptied and vented if applicable NA All implantable devices assessed, documented and approved NA Intravenous access site secured and place NA Valuables secured Linens and cotton and cotton/polyester blend (less than 51% polyester) Personal oil-based products / skin lotions / body lotions removed Wigs or hairpieces removed NA Smoking or tobacco materials removed NA Books / newspapers / magazines / loose paper removed Cologne, aftershave, perfume and deodorant removed Jewelry removed (may wrap wedding band) Make-up removed NA Hair care products removed NA Battery operated devices (external) removed Heating patches and chemical warmers removed Titanium eyewear removed Nail polish cured greater than 10 hours NA Casting material cured greater than 10 hours NA Hearing  aids removed NA Loose dentures or partials removed NA Prosthetics have been removed NA Patient demonstrates correct use of air break device (if applicable) Patient concerns have been addressed Patient grounding bracelet on and cord attached to chamber Specifics for Inpatients (complete in addition to above) Medication sheet  sent with patient NA Intravenous medications needed or due during therapy sent with patient NA Drainage tubes (e.g. nasogastric tube or chest tube secured and vented) NA Endotracheal or Tracheotomy tube secured NA Cuff deflated of air and inflated with saline NA Airway suctioned NA Electronic Signature(s) Signed: 03/06/2021 5:02:32 PM By: Donavan Burnet EMT Entered By: Donavan Burnet on 03/06/2021 08:04:57

## 2021-03-07 NOTE — Progress Notes (Signed)
[name] [name], [name] [name] ([id number]) Visit Report for 03/07/2021 Arrival Information Details Patient Name: Date of Service: [name] [name], [name] [name] 03/07/2021 8:00 A M Medical Record Number: [id number] Patient Account Number: [id number] Date of Birth/Sex: Treating RN: 05-May-1951 (70 y.o. [name] [name], [name] [name] Primary Care [name] [name]: [name] [name] Other Clinician: Donavan [name] Referring [name] [name]: Treating [name] [name]/Extender: [name] [name] Section in Treatment: 6 Visit Information History Since Last Visit All ordered tests and consults were completed: Yes Patient Arrived: Ambulatory Added or deleted any medications: No Arrival Time: 08:12 Any new allergies or adverse reactions: No Accompanied By: self Had a fall or experienced change in No Transfer Assistance: None activities of daily living that may affect Patient Identification Verified: Yes risk of falls: Secondary Verification Process Completed: Yes Signs or symptoms of abuse/neglect since last visito No Patient Requires Transmission-Based Precautions: No Hospitalized since last visit: No Patient Has Alerts: Yes Implantable device outside of the clinic excluding No cellular tissue based products placed in the center since last visit: Pain Present Now: No Electronic Signature(s) Signed: 03/07/2021 12:03:59 PM By: [name] [name] EMT Entered By: [name] [name] on 03/07/2021 08:14:21 -------------------------------------------------------------------------------- Encounter Discharge Information Details Patient Name: Date of Service: [name] Hawthorne B. 03/07/2021 8:00 A M Medical Record Number: [id number] Patient Account Number: [id number] Date of Birth/Sex: Treating RN: 04/20/1951 (70 y.o. [name] [name] Primary Care Jantz Main: [name] [name] Other Clinician: Donavan [name] Referring [name] [name]: Treating [name] [name]/Extender: [name] [name] Section in Treatment: 6 Encounter Discharge Information  Items Discharge Condition: Stable Ambulatory Status: Ambulatory Discharge Destination: Other (Note Required) Transportation: Private Auto Schedule Follow-up Appointment: No Clinical Summary of Care: Notes patient goes to work after treatments. Electronic Signature(s) Signed: 03/07/2021 12:03:59 PM By: [name] [name] EMT Entered By: [name] [name] on 03/07/2021 11:10:20 -------------------------------------------------------------------------------- Vitals Details Patient Name: Date of Service: [name] Hawthorne B. 03/07/2021 8:00 A M Medical Record Number: [id number] Patient Account Number: [id number] Date of Birth/Sex: Treating RN: 10-07-1950 (70 y.o. [name] [name] Primary Care Masoud Nyce: [name] [name] Other Clinician: Donavan [name] Referring Zyion Doxtater: Treating Matalie Romberger/Extender: [name] [name] Section in Treatment: 6 Vital Signs Time Taken: 08:05 Temperature (F): 97.7 Height (in): 73 Pulse (bpm): 53 Weight (lbs): 222 Respiratory Rate (breaths/min): 18 Body Mass Index (BMI): 29.3 Blood Pressure (mmHg): 124/54 Capillary Blood Glucose (mg/dl): 144 Reference Range: 80 - 120 mg / dl Electronic Signature(s) Signed: 03/07/2021 12:03:59 PM By: [name] [name] EMT Entered By: [name] [name] on 03/07/2021 08:15:22

## 2021-03-10 ENCOUNTER — Other Ambulatory Visit: Payer: Self-pay

## 2021-03-10 ENCOUNTER — Encounter (HOSPITAL_BASED_OUTPATIENT_CLINIC_OR_DEPARTMENT_OTHER): Payer: Medicare HMO | Admitting: Internal Medicine

## 2021-03-10 DIAGNOSIS — I129 Hypertensive chronic kidney disease with stage 1 through stage 4 chronic kidney disease, or unspecified chronic kidney disease: Secondary | ICD-10-CM | POA: Diagnosis not present

## 2021-03-10 DIAGNOSIS — I2510 Atherosclerotic heart disease of native coronary artery without angina pectoris: Secondary | ICD-10-CM | POA: Diagnosis not present

## 2021-03-10 DIAGNOSIS — E1151 Type 2 diabetes mellitus with diabetic peripheral angiopathy without gangrene: Secondary | ICD-10-CM | POA: Diagnosis not present

## 2021-03-10 DIAGNOSIS — N1831 Chronic kidney disease, stage 3a: Secondary | ICD-10-CM | POA: Diagnosis not present

## 2021-03-10 DIAGNOSIS — I252 Old myocardial infarction: Secondary | ICD-10-CM | POA: Diagnosis not present

## 2021-03-10 DIAGNOSIS — L97518 Non-pressure chronic ulcer of other part of right foot with other specified severity: Secondary | ICD-10-CM | POA: Diagnosis not present

## 2021-03-10 DIAGNOSIS — E1169 Type 2 diabetes mellitus with other specified complication: Secondary | ICD-10-CM | POA: Diagnosis not present

## 2021-03-10 DIAGNOSIS — Z8589 Personal history of malignant neoplasm of other organs and systems: Secondary | ICD-10-CM | POA: Diagnosis not present

## 2021-03-10 DIAGNOSIS — E1140 Type 2 diabetes mellitus with diabetic neuropathy, unspecified: Secondary | ICD-10-CM | POA: Diagnosis not present

## 2021-03-10 DIAGNOSIS — M86371 Chronic multifocal osteomyelitis, right ankle and foot: Secondary | ICD-10-CM | POA: Diagnosis not present

## 2021-03-10 DIAGNOSIS — L97511 Non-pressure chronic ulcer of other part of right foot limited to breakdown of skin: Secondary | ICD-10-CM

## 2021-03-10 DIAGNOSIS — E11621 Type 2 diabetes mellitus with foot ulcer: Secondary | ICD-10-CM

## 2021-03-10 DIAGNOSIS — E1122 Type 2 diabetes mellitus with diabetic chronic kidney disease: Secondary | ICD-10-CM | POA: Diagnosis not present

## 2021-03-10 DIAGNOSIS — G4733 Obstructive sleep apnea (adult) (pediatric): Secondary | ICD-10-CM | POA: Diagnosis not present

## 2021-03-10 DIAGNOSIS — Z7984 Long term (current) use of oral hypoglycemic drugs: Secondary | ICD-10-CM | POA: Diagnosis not present

## 2021-03-10 LAB — GLUCOSE, CAPILLARY
Glucose-Capillary: 178 mg/dL — ABNORMAL HIGH (ref 70–99)
Glucose-Capillary: 119 mg/dL — ABNORMAL HIGH (ref 70–99)

## 2021-03-11 ENCOUNTER — Encounter (HOSPITAL_BASED_OUTPATIENT_CLINIC_OR_DEPARTMENT_OTHER): Payer: Medicare HMO | Admitting: Internal Medicine

## 2021-03-11 DIAGNOSIS — E1140 Type 2 diabetes mellitus with diabetic neuropathy, unspecified: Secondary | ICD-10-CM | POA: Diagnosis not present

## 2021-03-11 DIAGNOSIS — I129 Hypertensive chronic kidney disease with stage 1 through stage 4 chronic kidney disease, or unspecified chronic kidney disease: Secondary | ICD-10-CM | POA: Diagnosis not present

## 2021-03-11 DIAGNOSIS — L97511 Non-pressure chronic ulcer of other part of right foot limited to breakdown of skin: Secondary | ICD-10-CM | POA: Diagnosis not present

## 2021-03-11 DIAGNOSIS — E11621 Type 2 diabetes mellitus with foot ulcer: Secondary | ICD-10-CM | POA: Diagnosis not present

## 2021-03-11 DIAGNOSIS — M86371 Chronic multifocal osteomyelitis, right ankle and foot: Secondary | ICD-10-CM | POA: Diagnosis not present

## 2021-03-11 DIAGNOSIS — Z8589 Personal history of malignant neoplasm of other organs and systems: Secondary | ICD-10-CM | POA: Diagnosis not present

## 2021-03-11 DIAGNOSIS — E1122 Type 2 diabetes mellitus with diabetic chronic kidney disease: Secondary | ICD-10-CM | POA: Diagnosis not present

## 2021-03-11 DIAGNOSIS — L97518 Non-pressure chronic ulcer of other part of right foot with other specified severity: Secondary | ICD-10-CM | POA: Diagnosis not present

## 2021-03-11 DIAGNOSIS — E1169 Type 2 diabetes mellitus with other specified complication: Secondary | ICD-10-CM | POA: Diagnosis not present

## 2021-03-11 DIAGNOSIS — I252 Old myocardial infarction: Secondary | ICD-10-CM | POA: Diagnosis not present

## 2021-03-11 DIAGNOSIS — E1151 Type 2 diabetes mellitus with diabetic peripheral angiopathy without gangrene: Secondary | ICD-10-CM | POA: Diagnosis not present

## 2021-03-11 DIAGNOSIS — Z7984 Long term (current) use of oral hypoglycemic drugs: Secondary | ICD-10-CM | POA: Diagnosis not present

## 2021-03-11 DIAGNOSIS — G4733 Obstructive sleep apnea (adult) (pediatric): Secondary | ICD-10-CM | POA: Diagnosis not present

## 2021-03-11 DIAGNOSIS — I2510 Atherosclerotic heart disease of native coronary artery without angina pectoris: Secondary | ICD-10-CM | POA: Diagnosis not present

## 2021-03-11 DIAGNOSIS — N1831 Chronic kidney disease, stage 3a: Secondary | ICD-10-CM | POA: Diagnosis not present

## 2021-03-11 LAB — GLUCOSE, CAPILLARY
Glucose-Capillary: 153 mg/dL — ABNORMAL HIGH (ref 70–99)
Glucose-Capillary: 128 mg/dL — ABNORMAL HIGH (ref 70–99)

## 2021-03-11 NOTE — Progress Notes (Signed)
SHAFI, MORGANTE ([id number]) Visit Report for 03/11/2021 Arrival Information Details Patient Name: Date of Service: [name] [name], [name] [name] 03/11/2021 8:00 A M Medical Record Number: [id number] Patient Account Number: [id number] Date of Birth/Sex: Treating RN: 1951/05/16 (70 y.o. [name] [name] Primary Care [name] [name]: [name] [name] Other Clinician: Donavan [name] Referring [name] [name]: Treating [name] [name]/Extender: [name] [name] in Treatment: 6 Visit Information History Since Last Visit All ordered tests and consults were completed: Yes Patient Arrived: Ambulatory Added or deleted any medications: No Arrival Time: 07:30 Any new allergies or adverse reactions: No Accompanied By: self Had a fall or experienced change in No Transfer Assistance: None activities of daily living that may affect Patient Identification Verified: Yes risk of falls: Secondary Verification Process Completed: Yes Signs or symptoms of abuse/neglect since last visito No Patient Requires Transmission-Based Precautions: No Hospitalized since last visit: No Patient Has Alerts: Yes Implantable device outside of the clinic excluding No cellular tissue based products placed in the center since last visit: Pain Present Now: No Electronic Signature(s) Signed: 03/11/2021 1:07:07 PM By: [name] [name] EMT Entered By: [name] [name] on 03/11/2021 08:02:24 -------------------------------------------------------------------------------- Encounter Discharge Information Details Patient Name: Date of Service: [name] Hawthorne B. 03/11/2021 8:00 A M Medical Record Number: [id number] Patient Account Number: [id number] Date of Birth/Sex: Treating RN: 10-22-1950 (70 y.o. [name] [name] Primary Care [name] [name]: [name] [name] Other Clinician: Donavan [name] Referring [name] [name]: Treating [name] [name]/Extender: [name] [name] in Treatment: 6 Encounter Discharge Information  Items Discharge Condition: Stable Ambulatory Status: Ambulatory Discharge Destination: Home Transportation: Private Auto Accompanied By: self Schedule Follow-up Appointment: No Clinical Summary of Care: Electronic Signature(s) Signed: 03/11/2021 1:07:07 PM By: [name] [name] EMT Entered By: [name] [name] on 03/11/2021 10:51:31 -------------------------------------------------------------------------------- Vitals Details Patient Name: Date of Service: [name] Hawthorne B. 03/11/2021 8:00 A M Medical Record Number: [id number] Patient Account Number: [id number] Date of Birth/Sex: Treating RN: March 06, 1951 (70 y.o. [name] [name] Primary Care [name] [name]: [name] [name] Other Clinician: Donavan [name] Referring [name] [name]: Treating [name] [name]/Extender: [name] [name] in Treatment: 6 Vital Signs Time Taken: 08:02 Temperature (F): 98.3 Height (in): 73 Pulse (bpm): 53 Weight (lbs): 222 Respiratory Rate (breaths/min): 18 Body Mass Index (BMI): 29.3 Blood Pressure (mmHg): 131/66 Capillary Blood Glucose (mg/dl): 153 Reference Range: 80 - 120 mg / dl Electronic Signature(s) Signed: 03/11/2021 1:07:07 PM By: [name] [name] EMT Entered By: [name] [name] on 03/11/2021 08:03:36

## 2021-03-11 NOTE — Progress Notes (Addendum)
RAWN, LEVERETTE ([id number]) Visit Report for 03/10/2021 HBO Details Patient Name: Date of Service: [name] [name], [name] [name] 03/10/2021 8:00 A M Medical Record Number: [id number] Patient Account Number: [id number] Date of Birth/Sex: Treating RN: [birth year] (70 y.o. [name] [name] Primary Care Deoni Cosey: [name] Other Clinician: Donavan Burnet Referring Labrittany Wechter: Treating Tao Satz/Extender: Oren Section in Treatment: 6 HBO Treatment Course Details Treatment Course Number: 1 Ordering Bueford Arp: Bernerd Pho Treatments Ordered: otal 40 HBO Treatment Start Date: 01/27/2021 HBO Indication: Diabetic Ulcer(s) of the Lower Extremity HBO Treatment Details Treatment Number: 26 Patient Type: Outpatient Chamber Type: Monoplace Chamber Serial #: [id number] Treatment Protocol: 2.5 ATA with 90 minutes oxygen, with two 5 minute air breaks Treatment Details Compression Rate Down: 2.5 psi / minute De-Compression Rate Up: 2.5 psi / minute A breaks and breathing ir Compress Tx Pressure periods Decompress Decompress Begins Reached (leave unused spaces Begins Ends blank) Chamber Pressure (ATA 1 2.5 2.5 2.5 2.5 2.5 - - 2.5 1 ) Clock Time (24 hr) 08:27 08:37 08:57 09:02 09:32 09:37 - - 10:07 10:16 Treatment Length: 109 (minutes) Treatment Segments: 4 Vital Signs Capillary Blood Glucose Reference Range: 80 - 120 mg / dl HBO Diabetic Blood Glucose Intervention Range: <131 mg/dl or >249 mg/dl Type: Time Vitals Blood Pulse: Respiratory Capillary Blood Glucose Pulse Action Temperature: Taken: Pressure: Rate: Glucose (mg/dl): Meter #: Oximetry (%) Taken: Pre 07:50 127/64 56 18 98.1 178 Post 10:33 143/71 49 18 97.8 119 discharge per protocol Treatment Response Treatment Toleration: Well Treatment Completion Status: Treatment Completed without Adverse Event Additional Procedure Documentation Tissue Sevierity: Limited to breakdown of skin Physician HBO  Attestation: I certify that I supervised this HBO treatment in accordance with Medicare guidelines. A trained emergency response team is readily available per Yes hospital policies and procedures. Continue HBOT as ordered. Yes Electronic Signature(s) Signed: 03/11/2021 1:06:22 PM By: Kalman Shan DO Signed: 03/11/2021 1:07:07 PM By: Donavan Burnet EMT Previous Signature: 03/10/2021 4:52:04 PM Version By: Kalman Shan DO Entered By: Donavan Burnet on 03/11/2021 12:49:19 -------------------------------------------------------------------------------- HBO Safety Checklist Details Patient Name: Date of Service: [name] Hawthorne B. 03/10/2021 8:00 A M Medical Record Number: [id number] Patient Account Number: [id number] Date of Birth/Sex: Treating RN: 1950/08/01 (70 y.o. [name] [name] Primary Care Peyson Delao: [name] Other Clinician: Donavan Burnet Referring Leena Tiede: Treating Graham Doukas/Extender: Oren Section in Treatment: 6 HBO Safety Checklist Items Safety Checklist Consent Form Signed Patient voided / foley secured and emptied When did you last eato 0700 Last dose of injectable or oral agent Metformin yesterday Ostomy pouch emptied and vented if applicable NA All implantable devices assessed, documented and approved NA Intravenous access site secured and place NA Valuables secured Linens and cotton and cotton/polyester blend (less than 51% polyester) Personal oil-based products / skin lotions / body lotions removed Wigs or hairpieces removed Smoking or tobacco materials removed NA Books / newspapers / magazines / loose paper removed Cologne, aftershave, perfume and deodorant removed Jewelry removed (may wrap wedding band) Make-up removed NA Hair care products removed NA Battery operated devices (external) removed Heating patches and chemical warmers removed Titanium eyewear removed Nail polish cured greater than 10  hours NA Casting material cured greater than 10 hours NA Hearing aids removed NA Loose dentures or partials removed NA Prosthetics have been removed NA Patient demonstrates correct use of air break device (if applicable) Patient concerns have been addressed Patient grounding bracelet on and cord attached to chamber Specifics for Inpatients (complete in addition to  above) Medication sheet sent with patient NA Intravenous medications needed or due during therapy sent with patient NA Drainage tubes (e.g. nasogastric tube or chest tube secured and vented) NA Endotracheal or Tracheotomy tube secured NA Cuff deflated of air and inflated with saline NA Airway suctioned NA Electronic Signature(s) Signed: 03/11/2021 12:46:31 PM By: Donavan Burnet EMT Entered By: Donavan Burnet on 03/10/2021 08:02:31

## 2021-03-11 NOTE — Progress Notes (Signed)
[name] [name], [name] [name] ([id number]) Visit Report for 03/10/2021 Arrival Information Details Patient Name: Date of Service: [name] [name], [name] [name] 03/10/2021 8:00 A M Medical Record Number: [id number] Patient Account Number: [id number] Date of Birth/Sex: Treating RN: 05/18/1951 (70 y.o. [name] Primary Care Ivory Bail: [name] Other Clinician: Donavan Burnet Referring Nyelah Emmerich: Treating Courtnei Ruddell/Extender: Oren Section in Treatment: 6 Visit Information History Since Last Visit All ordered tests and consults were completed: Yes Patient Arrived: Ambulatory Added or deleted any medications: No Arrival Time: 07:30 Any new allergies or adverse reactions: No Accompanied By: self Had a fall or experienced change in No Transfer Assistance: None activities of daily living that may affect Patient Identification Verified: Yes risk of falls: Secondary Verification Process Completed: Yes Signs or symptoms of abuse/neglect since last visito No Patient Requires Transmission-Based Precautions: No Hospitalized since last visit: No Patient Has Alerts: Yes Implantable device outside of the clinic excluding No cellular tissue based products placed in the center since last visit: Pain Present Now: No Electronic Signature(s) Signed: 03/11/2021 12:46:31 PM By: Donavan Burnet EMT Entered By: Donavan Burnet on 03/10/2021 08:00:35 -------------------------------------------------------------------------------- Encounter Discharge Information Details Patient Name: Date of Service: [name] Hawthorne B. 03/10/2021 8:00 A M Medical Record Number: [id number] Patient Account Number: [id number] Date of Birth/Sex: Treating RN: [birth year] (70 y.o. [name] Primary Care Korene Dula: [name] Other Clinician: Donavan Burnet Referring Kumari Sculley: Treating Carriann Hesse/Extender: Oren Section in Treatment: 6 Encounter Discharge Information  Items Discharge Condition: Stable Ambulatory Status: Ambulatory Discharge Destination: Other (Note Required) Transportation: Private Auto Accompanied By: self Schedule Follow-up Appointment: Yes Clinical Summary of Care: Notes Patient goes to work after treatments Electronic Signature(s) Signed: 03/11/2021 1:07:07 PM By: Donavan Burnet EMT Entered By: Donavan Burnet on 03/11/2021 12:48:46 -------------------------------------------------------------------------------- [place] Details Patient Name: Date of Service: [name] Hawthorne B. 03/10/2021 8:00 A M Medical Record Number: [id number] Patient Account Number: [id number] Date of Birth/Sex: Treating RN: [birth year] (70 y.o. [name] Primary Care Harleen Fineberg: [name] Other Clinician: Donavan Burnet Referring Alzora Ha: Treating Orpheus Hayhurst/Extender: Oren Section in Treatment: 6 Vital Signs Time Taken: 07:50 Temperature (F): 98.1 Height (in): 73 Pulse (bpm): 56 Weight (lbs): 222 Respiratory Rate (breaths/min): 18 Body Mass Index (BMI): 29.3 Blood Pressure (mmHg): 127/64 Capillary Blood Glucose (mg/dl): 178 Reference Range: 80 - 120 mg / dl Electronic Signature(s) Signed: 03/11/2021 12:46:31 PM By: Donavan Burnet EMT Entered By: Donavan Burnet on 03/10/2021 08:01:17

## 2021-03-11 NOTE — Progress Notes (Signed)
LASEAN, BOWLEN ([id number]) Visit Report for 03/10/2021 SuperBill Details Patient Name: Date of Service: [name] [name], [name] [name] 03/10/2021 Medical Record Number: [id number] Patient Account Number: [id number] Date of Birth/Sex: Treating RN: 08-17-1950 (70 y.o. [name] [name] Primary Care Provider: [name] Other Clinician: Donavan Burnet Referring Provider: Treating Provider/Extender: Oren Section in Treatment: 6 Diagnosis Coding ICD-10 Codes Code Description [phone] Type 2 diabetes mellitus with foot ulcer L97.518 Non-pressure chronic ulcer of other part of right foot with other specified severity L97.511 Non-pressure chronic ulcer of other part of right foot limited to breakdown of skin M86.371 Chronic multifocal osteomyelitis, right ankle and foot E11.621 Type 2 diabetes mellitus with foot ulcer Facility Procedures CPT4 Code Description Modifier Quantity [id number]-(Facility Use Only) HBOT full body chamber, 42mn , 4 ICD-10 Diagnosis Description E11.621 Type 2 diabetes mellitus with foot ulcer L97.518 Non-pressure chronic ulcer of other part of right foot with other specified severity L97.511 Non-pressure chronic ulcer of other part of right foot limited to breakdown of skin M86.371 Chronic multifocal osteomyelitis, right ankle and foot Physician Procedures Quantity CPT4 Code Description Modifier [id number] PHYS HYPERBARIC OXYGEN THERAPY 1 ICD-10 Diagnosis Description E11.621 Type 2 diabetes mellitus with foot ulcer L97.518 Non-pressure chronic ulcer of other part of right foot with other specified severity L97.511 Non-pressure chronic ulcer of other part of right foot limited to breakdown of skin M86.371 Chronic multifocal osteomyelitis, right ankle and foot Electronic Signature(s) Signed: 03/10/2021 4:52:04 PM By: HKalman ShanDO Signed: 03/11/2021 12:46:31 PM By: SDonavan BurnetEMT Entered By: SDonavan Burneton  03/10/2021 11:08:39

## 2021-03-12 ENCOUNTER — Other Ambulatory Visit: Payer: Self-pay

## 2021-03-12 ENCOUNTER — Encounter (HOSPITAL_BASED_OUTPATIENT_CLINIC_OR_DEPARTMENT_OTHER): Payer: Medicare HMO | Admitting: Physician Assistant

## 2021-03-12 DIAGNOSIS — L97518 Non-pressure chronic ulcer of other part of right foot with other specified severity: Secondary | ICD-10-CM | POA: Diagnosis not present

## 2021-03-12 DIAGNOSIS — L97511 Non-pressure chronic ulcer of other part of right foot limited to breakdown of skin: Secondary | ICD-10-CM | POA: Diagnosis not present

## 2021-03-12 DIAGNOSIS — N1831 Chronic kidney disease, stage 3a: Secondary | ICD-10-CM | POA: Diagnosis not present

## 2021-03-12 DIAGNOSIS — E1140 Type 2 diabetes mellitus with diabetic neuropathy, unspecified: Secondary | ICD-10-CM | POA: Diagnosis not present

## 2021-03-12 DIAGNOSIS — Z8589 Personal history of malignant neoplasm of other organs and systems: Secondary | ICD-10-CM | POA: Diagnosis not present

## 2021-03-12 DIAGNOSIS — I129 Hypertensive chronic kidney disease with stage 1 through stage 4 chronic kidney disease, or unspecified chronic kidney disease: Secondary | ICD-10-CM | POA: Diagnosis not present

## 2021-03-12 DIAGNOSIS — Z7984 Long term (current) use of oral hypoglycemic drugs: Secondary | ICD-10-CM | POA: Diagnosis not present

## 2021-03-12 DIAGNOSIS — E11621 Type 2 diabetes mellitus with foot ulcer: Secondary | ICD-10-CM | POA: Diagnosis not present

## 2021-03-12 DIAGNOSIS — E1151 Type 2 diabetes mellitus with diabetic peripheral angiopathy without gangrene: Secondary | ICD-10-CM | POA: Diagnosis not present

## 2021-03-12 DIAGNOSIS — I252 Old myocardial infarction: Secondary | ICD-10-CM | POA: Diagnosis not present

## 2021-03-12 DIAGNOSIS — G4733 Obstructive sleep apnea (adult) (pediatric): Secondary | ICD-10-CM | POA: Diagnosis not present

## 2021-03-12 DIAGNOSIS — I2510 Atherosclerotic heart disease of native coronary artery without angina pectoris: Secondary | ICD-10-CM | POA: Diagnosis not present

## 2021-03-12 DIAGNOSIS — E1169 Type 2 diabetes mellitus with other specified complication: Secondary | ICD-10-CM | POA: Diagnosis not present

## 2021-03-12 DIAGNOSIS — M86371 Chronic multifocal osteomyelitis, right ankle and foot: Secondary | ICD-10-CM | POA: Diagnosis not present

## 2021-03-12 DIAGNOSIS — E1122 Type 2 diabetes mellitus with diabetic chronic kidney disease: Secondary | ICD-10-CM | POA: Diagnosis not present

## 2021-03-12 LAB — GLUCOSE, CAPILLARY
Glucose-Capillary: 141 mg/dL — ABNORMAL HIGH (ref 70–99)
Glucose-Capillary: 113 mg/dL — ABNORMAL HIGH (ref 70–99)

## 2021-03-12 NOTE — Progress Notes (Addendum)
BRETT, ILLES ([id number]) Visit Report for 03/12/2021 Arrival Information Details Patient Name: Date of Service: [name] [name], [name] [name] 03/12/2021 8:00 A M Medical Record Number: [id number] Patient Account Number: [id number] Date of Birth/Sex: Treating RN: 1951/03/31 (70 y.o. Ulyses Amor, Vaughan Basta Primary Care Chanee Henrickson: [name] Other Clinician: Donavan Burnet Referring Sonji Starkes: Treating Britiny Defrain/Extender: Tally Joe in Treatment: 7 Visit Information History Since Last Visit All ordered tests and consults were completed: Yes Patient Arrived: Ambulatory Added or deleted any medications: No Arrival Time: 07:30 Any new allergies or adverse reactions: No Accompanied By: self Had a fall or experienced change in No Transfer Assistance: None activities of daily living that may affect Patient Identification Verified: Yes risk of falls: Secondary Verification Process Completed: Yes Signs or symptoms of abuse/neglect since last visito No Patient Requires Transmission-Based Precautions: No Hospitalized since last visit: No Patient Has Alerts: Yes Implantable device outside of the clinic excluding No cellular tissue based products placed in the center since last visit: Pain Present Now: No Electronic Signature(s) Signed: 03/14/2021 4:26:36 PM By: Donavan Burnet EMT Previous Signature: 03/12/2021 8:22:23 AM Version By: Donavan Burnet EMT Entered By: Donavan Burnet on 03/12/2021 10:34:41 -------------------------------------------------------------------------------- Encounter Discharge Information Details Patient Name: Date of Service: [name] Hawthorne B. 03/12/2021 8:00 A M Medical Record Number: [id number] Patient Account Number: [id number] Date of Birth/Sex: Treating RN: [birth year] (70 y.o. [name] Primary Care Viviane Semidey: [name] Other Clinician: Donavan Burnet Referring Petra Sargeant: Treating Mery Guadalupe/Extender: Tally Joe in Treatment: 7 Encounter Discharge Information Items Discharge Condition: Stable Ambulatory Status: Ambulatory Discharge Destination: Other (Note Required) Transportation: Private Auto Accompanied By: self Schedule Follow-up Appointment: No Clinical Summary of Care: Notes Patient goes to work after treatment. Electronic Signature(s) Signed: 03/14/2021 4:26:36 PM By: Donavan Burnet EMT Entered By: Donavan Burnet on 03/12/2021 10:31:12 -------------------------------------------------------------------------------- Vitals Details Patient Name: Date of Service: [name] Hawthorne B. 03/12/2021 8:00 A M Medical Record Number: [id number] Patient Account Number: [id number] Date of Birth/Sex: Treating RN: 06-03-1951 (70 y.o. [name] Primary Care Izell Labat: [name] Other Clinician: Donavan Burnet Referring Robson Trickey: Treating Jessiah Wojnar/Extender: Tally Joe in Treatment: 7 Vital Signs Time Taken: 08:00 Temperature (F): 98.0 Height (in): 73 Pulse (bpm): 56 Weight (lbs): 222 Respiratory Rate (breaths/min): 18 Body Mass Index (BMI): 29.3 Blood Pressure (mmHg): 120/66 Capillary Blood Glucose (mg/dl): 141 Reference Range: 80 - 120 mg / dl Electronic Signature(s) Signed: 03/14/2021 4:18:55 PM By: Donavan Burnet EMT Previous Signature: 03/12/2021 8:22:23 AM Version By: Donavan Burnet EMT Entered By: Donavan Burnet on 03/12/2021 10:34:51

## 2021-03-12 NOTE — Progress Notes (Signed)
AERIC, WERLING ([id number]) Visit Report for 03/11/2021 HBO Details Patient Name: Date of Service: [name] [name], [name] [name] 03/11/2021 8:00 A M Medical Record Number: [id number] Patient Account Number: [id number] Date of Birth/Sex: Treating RN: 27-Oct-1950 (70 y.o. [name] [name], [name] [name] Primary Care [name] [name]: [name] [name] Other Clinician: Donavan Burnet Referring Merrit Friesen: Treating Charrisse Masley/Extender: Lanier Clam in Treatment: 6 HBO Treatment Course Details Treatment Course Number: 1 Ordering Pinchas Reither: Bernerd Pho Treatments Ordered: otal 40 HBO Treatment Start Date: 01/27/2021 HBO Indication: Diabetic Ulcer(s) of the Lower Extremity HBO Treatment Details Treatment Number: 27 Patient Type: Outpatient Chamber Type: Monoplace Chamber Serial #: [id number] Treatment Protocol: 2.5 ATA with 90 minutes oxygen, with two 5 minute air breaks Treatment Details Compression Rate Down: 2.5 psi / minute De-Compression Rate Up: 2.5 psi / minute A breaks and breathing ir Compress Tx Pressure periods Decompress Decompress Begins Reached (leave unused spaces Begins Ends blank) Chamber Pressure (ATA 1 2.5 2.5 2.5 2.5 2.5 - - 2.5 1 ) Clock Time (24 hr) 08:07 08:16 08:46 08:51 09:21 09:26 - - 09:56 10:05 Treatment Length: 118 (minutes) Treatment Segments: 4 Vital Signs Capillary Blood Glucose Reference Range: 80 - 120 mg / dl HBO Diabetic Blood Glucose Intervention Range: <131 mg/dl or >249 mg/dl Type: Time Vitals Blood Pulse: Respiratory Capillary Blood Glucose Pulse Action Temperature: Taken: Pressure: Rate: Glucose (mg/dl): Meter #: Oximetry (%) Taken: Pre 08:02 131/66 53 18 98.3 153 Post 10:22 152/71 53 18 97.9 128 discharge per protocol Treatment Response Treatment Toleration: Well Treatment Completion Status: Treatment Completed without Adverse Event Additional Procedure Documentation Tissue Sevierity: Limited to breakdown of skin Raaga Maeder Notes No  concerns with treatment given Physician HBO Attestation: I certify that I supervised this HBO treatment in accordance with Medicare guidelines. A trained emergency response team is readily available per Yes hospital policies and procedures. Continue HBOT as ordered. Yes Electronic Signature(s) Signed: 03/12/2021 7:53:49 AM By: Linton Ham MD Previous Signature: 03/11/2021 1:07:07 PM Version By: Donavan Burnet EMT Entered By: Linton Ham on 03/11/2021 14:55:15 -------------------------------------------------------------------------------- HBO Safety Checklist Details Patient Name: Date of Service: [name] Hawthorne B. 03/11/2021 8:00 A M Medical Record Number: [id number] Patient Account Number: [id number] Date of Birth/Sex: Treating RN: Nov 14, 1950 (70 y.o. [name] [name], [name] Primary Care Lolamae Voisin: [name] [name] Other Clinician: Donavan Burnet Referring Olanda Boughner: Treating Sekou Zuckerman/Extender: Lanier Clam in Treatment: 6 HBO Safety Checklist Items Safety Checklist Consent Form Signed Patient voided / foley secured and emptied When did you last eato 0700 Last dose of injectable or oral agent metformin after treatment yesterday Ostomy pouch emptied and vented if applicable NA All implantable devices assessed, documented and approved NA Intravenous access site secured and place NA Valuables secured Linens and cotton and cotton/polyester blend (less than 51% polyester) Personal oil-based products / skin lotions / body lotions removed Wigs or hairpieces removed NA Smoking or tobacco materials removed NA Books / newspapers / magazines / loose paper removed Cologne, aftershave, perfume and deodorant removed Jewelry removed (may wrap wedding band) Make-up removed NA Hair care products removed NA Battery operated devices (external) removed Heating patches and chemical warmers removed Titanium eyewear removed Nail polish cured greater than 10  hours NA Casting material cured greater than 10 hours NA Hearing aids removed NA Loose dentures or partials removed NA Prosthetics have been removed NA Patient demonstrates correct use of air break device (if applicable) Patient concerns have been addressed Patient grounding bracelet on and cord attached to chamber Specifics for Inpatients (complete in  addition to above) Medication sheet sent with patient NA Intravenous medications needed or due during therapy sent with patient NA Drainage tubes (e.g. nasogastric tube or chest tube secured and vented) NA Endotracheal or Tracheotomy tube secured NA Cuff deflated of air and inflated with saline NA Airway suctioned NA Electronic Signature(s) Signed: 03/11/2021 1:07:07 PM By: Donavan Burnet EMT Entered By: Donavan Burnet on 03/11/2021 08:06:55

## 2021-03-12 NOTE — Progress Notes (Signed)
[name] [name], [name] [name] ([id number]) Visit Report for 03/11/2021 SuperBill Details Patient Name: Date of Service: [name] [name], [name] [name] 03/11/2021 Medical Record Number: [id number] Patient Account Number: [id number] Date of Birth/Sex: Treating RN: 1950-09-02 (70 y.o. [name], [name] Primary Care Provider: [name] Other Clinician: Donavan Burnet Referring Provider: Treating Provider/Extender: Lanier Clam in Treatment: 6 Diagnosis Coding ICD-10 Codes Code Description [phone] Type 2 diabetes mellitus with foot ulcer L97.518 Non-pressure chronic ulcer of other part of right foot with other specified severity L97.511 Non-pressure chronic ulcer of other part of right foot limited to breakdown of skin M86.371 Chronic multifocal osteomyelitis, right ankle and foot E11.621 Type 2 diabetes mellitus with foot ulcer Facility Procedures CPT4 Code Description Modifier Quantity [id number]-(Facility Use Only) HBOT full body chamber, 71mn , 4 ICD-10 Diagnosis Description E11.621 Type 2 diabetes mellitus with foot ulcer L97.518 Non-pressure chronic ulcer of other part of right foot with other specified severity L97.511 Non-pressure chronic ulcer of other part of right foot limited to breakdown of skin M86.371 Chronic multifocal osteomyelitis, right ankle and foot Physician Procedures Quantity CPT4 Code Description Modifier [id number] PHYS HYPERBARIC OXYGEN THERAPY 1 ICD-10 Diagnosis Description E11.621 Type 2 diabetes mellitus with foot ulcer L97.518 Non-pressure chronic ulcer of other part of right foot with other specified severity L97.511 Non-pressure chronic ulcer of other part of right foot limited to breakdown of skin M86.371 Chronic multifocal osteomyelitis, right ankle and foot Electronic Signature(s) Signed: 03/11/2021 1:07:07 PM By: SDonavan BurnetEMT Signed: 03/12/2021 7:53:49 AM By: RLinton HamMD Entered By: SDonavan Burneton  03/11/2021 10:51:04

## 2021-03-13 ENCOUNTER — Encounter (HOSPITAL_BASED_OUTPATIENT_CLINIC_OR_DEPARTMENT_OTHER): Payer: Medicare HMO | Admitting: Internal Medicine

## 2021-03-13 ENCOUNTER — Encounter (HOSPITAL_BASED_OUTPATIENT_CLINIC_OR_DEPARTMENT_OTHER): Payer: Medicare HMO | Attending: Internal Medicine | Admitting: Internal Medicine

## 2021-03-13 DIAGNOSIS — G4733 Obstructive sleep apnea (adult) (pediatric): Secondary | ICD-10-CM | POA: Diagnosis not present

## 2021-03-13 DIAGNOSIS — E1142 Type 2 diabetes mellitus with diabetic polyneuropathy: Secondary | ICD-10-CM | POA: Diagnosis not present

## 2021-03-13 DIAGNOSIS — E785 Hyperlipidemia, unspecified: Secondary | ICD-10-CM | POA: Insufficient documentation

## 2021-03-13 DIAGNOSIS — L97511 Non-pressure chronic ulcer of other part of right foot limited to breakdown of skin: Secondary | ICD-10-CM | POA: Insufficient documentation

## 2021-03-13 DIAGNOSIS — I2510 Atherosclerotic heart disease of native coronary artery without angina pectoris: Secondary | ICD-10-CM | POA: Insufficient documentation

## 2021-03-13 DIAGNOSIS — E1151 Type 2 diabetes mellitus with diabetic peripheral angiopathy without gangrene: Secondary | ICD-10-CM | POA: Diagnosis not present

## 2021-03-13 DIAGNOSIS — I10 Essential (primary) hypertension: Secondary | ICD-10-CM | POA: Insufficient documentation

## 2021-03-13 DIAGNOSIS — E11621 Type 2 diabetes mellitus with foot ulcer: Secondary | ICD-10-CM | POA: Insufficient documentation

## 2021-03-13 DIAGNOSIS — L97518 Non-pressure chronic ulcer of other part of right foot with other specified severity: Secondary | ICD-10-CM | POA: Diagnosis not present

## 2021-03-13 DIAGNOSIS — Z951 Presence of aortocoronary bypass graft: Secondary | ICD-10-CM | POA: Insufficient documentation

## 2021-03-13 DIAGNOSIS — E1122 Type 2 diabetes mellitus with diabetic chronic kidney disease: Secondary | ICD-10-CM | POA: Insufficient documentation

## 2021-03-13 DIAGNOSIS — M86371 Chronic multifocal osteomyelitis, right ankle and foot: Secondary | ICD-10-CM | POA: Insufficient documentation

## 2021-03-13 DIAGNOSIS — E1169 Type 2 diabetes mellitus with other specified complication: Secondary | ICD-10-CM | POA: Insufficient documentation

## 2021-03-13 DIAGNOSIS — I252 Old myocardial infarction: Secondary | ICD-10-CM | POA: Diagnosis not present

## 2021-03-13 DIAGNOSIS — L97512 Non-pressure chronic ulcer of other part of right foot with fat layer exposed: Secondary | ICD-10-CM | POA: Diagnosis not present

## 2021-03-13 LAB — GLUCOSE, CAPILLARY
Glucose-Capillary: 140 mg/dL — ABNORMAL HIGH (ref 70–99)
Glucose-Capillary: 118 mg/dL — ABNORMAL HIGH (ref 70–99)

## 2021-03-13 NOTE — Progress Notes (Signed)
[name] [name], [name] [name] ([id number]) Visit Report for 03/13/2021 HPI Details Patient Name: Date of Service: [name] [name], [name] [name] 03/13/2021 10:30 A M Medical Record Number: [id number] Patient Account Number: [id number] Date of Birth/Sex: Treating RN: 12/20/1950 (70 y.o. Hessie Diener Primary Care Provider: [name] Other Clinician: Referring Provider: Treating Provider/Extender: Lanier Clam in Treatment: 7 History of Present Illness HPI Description: ADMISSION 01/22/2021 This is a 70 year old man with type 2 diabetes and diabetic neuropathy on metformin. He has been referred to our clinic from the wound care center in [facility] by Dr. Adelina Mings for consideration of hyperbaric oxygen therapy. Looking through [facility] health link the patient has been followed by Dr. Rocco Serene since the fall of last year at which time he had a Wagner grade 2 wound on the plantar aspect of the right great toe. This deteriorated according to the patient in June when the toe began rapidly more swollen. Ultimately he required admission to hospital from 01/01/2021 through 01/07/2021 at which time he had streaking erythema up his foot. He had an MRI done on 12/26/2020 which showed medial plantar ulceration of the great toe to bone with diffuse soft tissue swelling of the great toe with enhancement. No fluid collection or hematoma there was osteomyelitis of the first proximal and distal phalanx no abscess. He also had small erosions of the first metatarsal head and first TMT joint suspicious for gout. Cultures grew group B strep and Prevotella. He was treated with IV Invanz and p.o. ciprofloxacin. In fact he was just stopped of the ciprofloxacin by infectious disease today although he remains on IV Invanz until the beginning of August. He is tolerating this well. There is been marked improvement in his wounds and he was referred for consideration of additional hyperbaric  oxygen therapy. 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 Surgically removed in 2018 ABIs were done on 05/06/2020 which showed noncompressible waveforms ABIs were biphasic at the ankles bilaterally. TBI's were not done 01/29/2021; patient is here for follow-up from his admission last week. In the interim he started HBO yesterday which he tolerated well. He is still on his IV antibiotics [Invanz]. We have been using endoform. He has wounds on the right great toe x3 and an abrasion injury on the dorsal right third toe 8/18; he has completed 18 hyperbaric treatments. This is out of 40. He has completed his Invanz as of about 2 weeks ago PICC line was removed. The only wound remaining on his right first toe was on the plantar aspect. The lateral and and dorsal wounds are closed 8/26; patient has completed 24 treatments. Wound at 0.1 x 0.1 x 0.1. Only remaining area on the plantar aspect of the right great toe. Much improved 9/1; the patient's involved right great toe wound essentially almost closed. I given him permission to graduate back into his usual footwear. He will have to continue to pad this area both with an insole in his shoe and perhaps an adherent foam dressing on the plantar toe on the right. Electronic Signature(s) Signed: 03/13/2021 5:31:22 PM By: Linton Ham MD Entered By: Linton Ham on 03/13/2021 11:31:53 -------------------------------------------------------------------------------- Physical Exam Details Patient Name: Date of Service: [name] Hawthorne B. 03/13/2021 10:30 A M Medical Record Number: [id number] Patient Account Number: [id number] Date of Birth/Sex: Treating  RN: 13-Jul-1951 (70 y.o. Hessie Diener Primary Care Provider: [name]  Other Clinician: Referring Provider: Treating Provider/Extender: Lanier Clam in Treatment: 7 Constitutional Sitting or standing Blood Pressure is within target range for patient.. Slightly bradycardic. Respirations regular, non-labored and within target range.. Temperature is normal and within the target range for the patient.Marland Kitchen Appears in no distress. Notes Wound exam; the multiple open areas that were on his left great toe with coexistent osteomyelitis of essentially all close down still some eschar on the plantar surface there is no erythema. No palpable abnormality of the interphalangeal joint on the right no effusion in that area. Electronic Signature(s) Signed: 03/13/2021 5:31:22 PM By: Linton Ham MD Entered By: Linton Ham on 03/13/2021 11:37:27 -------------------------------------------------------------------------------- Physician Orders Details Patient Name: Date of Service: [name] Hawthorne B. 03/13/2021 10:30 A M Medical Record Number: [id number] Patient Account Number: [id number] Date of Birth/Sex: Treating RN: [birth year] (70 y.o. [name], [name] Primary Care Provider: [name] Other Clinician: Referring Provider: Treating Provider/Extender: Lanier Clam in Treatment: 7 Verbal / Phone Orders: No Diagnosis Coding ICD-10 Coding Code Description E11.621 Type 2 diabetes mellitus with foot ulcer L97.518 Non-pressure chronic ulcer of other part of right foot with other specified severity L97.511 Non-pressure chronic ulcer of other part of right foot limited to breakdown of skin M86.371 Chronic multifocal osteomyelitis, right ankle and foot E11.621 Type 2 diabetes mellitus with foot ulcer Follow-up Appointments ppointment in 1 week. - with Dr. Dellia Nims Return A Bathing/ Shower/ Hygiene May shower and wash wound with soap and water. - prior to dressing change Off-Loading Open toe surgical shoe to: - right  foot Hyperbaric Oxygen Therapy Evaluate for HBO Therapy Indication: - Wagner 3 diabetic ulcer right great toe If appropriate for treatment, begin HBOT per protocol: 2.5 ATA for 90 Minutes with 2 Five (5) Minute A Breaks ir Total Number of Treatments: - 40 One treatments per day (delivered Monday through Friday unless otherwise specified in Special Instructions below): Finger stick Blood Glucose Pre- and Post- HBOT Treatment. Follow Hyperbaric Oxygen Glycemia Protocol A frin (Oxymetazoline HCL) 0.05% nasal spray - 1 spray in both nostrils daily as needed prior to HBO treatment for difficulty clearing ears Wound Treatment Wound #3 - T Great oe Wound Laterality: Plantar, Right Cleanser: Soap and Water 1 x Per Day/7 Days Discharge Instructions: May shower and wash wound with dial antibacterial soap and water prior to dressing change. Secondary Dressing: Optifoam Non-Adhesive Dressing, 4x4 in 1 x Per Day/7 Days Discharge Instructions: cut to make foam donut to offload Secured With: Conforming Stretch Gauze Bandage, Sterile 2x75 (in/in) 1 x Per Day/7 Days Discharge Instructions: Secure with stretch gauze as directed. GLYCEMIA INTERVENTIONS PROTOCOL PRE-HBO GLYCEMIA INTERVENTIONS ACTION INTERVENTION Obtain pre-HBO capillary blood glucose (ensure 1 physician order is in chart). A. Notify HBO physician and await physician orders. 2 If result is 70 mg/dl or below: B. If the result meets the hospital definition of a critical result, follow hospital policy. A. Give patient an 8 ounce Glucerna Shake, an 8 ounce Ensure, or 8 ounces of a Glucerna/Ensure equivalent dietary supplement*. B. Wait 30 minutes. If result is 71 mg/dl to 130 mg/dl: C. Retest patients capillary blood glucose (CBG). D. If result greater than or equal to 110 mg/dl, proceed with HBO. If result less than 110 mg/dl, notify HBO physician and consider holding HBO. If result is 131 mg/dl to 249 mg/dl: A. Proceed with  HBO. A. Notify HBO physician and await physician  orders. B. It is recommended to hold HBO and do If result is 250 mg/dl or greater: blood/urine ketone testing. C. If the result meets the hospital definition of a critical result, follow hospital policy. POST-HBO GLYCEMIA INTERVENTIONS ACTION INTERVENTION Obtain post HBO capillary blood glucose (ensure 1 physician order is in chart). A. Notify HBO physician and await physician orders. 2 If result is 70 mg/dl or below: B. If the result meets the hospital definition of a critical result, follow hospital policy. A. Give patient an 8 ounce Glucerna Shake, an 8 ounce Ensure, or 8 ounces of a Glucerna/Ensure equivalent dietary supplement*. B. Wait 15 minutes for symptoms of If result is 71 mg/dl to 100 mg/dl: hypoglycemia (i.e. nervousness, anxiety, sweating, chills, clamminess, irritability, confusion, tachycardia or dizziness). C. If patient asymptomatic, discharge patient. If patient symptomatic, repeat capillary blood glucose (CBG) and notify HBO physician. If result is 101 mg/dl to 249 mg/dl: A. Discharge patient. A. Notify HBO physician and await physician orders. B. It is recommended to do blood/urine ketone If result is 250 mg/dl or greater: testing. C. If the result meets the hospital definition of a critical result, follow hospital policy. *Juice or candies are NOT equivalent products. If patient refuses the Glucerna or Ensure, please consult the hospital dietitian for an appropriate substitute. Electronic Signature(s) Signed: 03/13/2021 5:31:22 PM By: Linton Ham MD Signed: 03/13/2021 5:36:38 PM By: Deon Pilling Entered By: Deon Pilling on 03/13/2021 10:37:44 -------------------------------------------------------------------------------- Problem List Details Patient Name: Date of Service: [name] Hawthorne B. 03/13/2021 10:30 A M Medical Record Number: [id number] Patient Account Number: [id number] Date of  Birth/Sex: Treating RN: 12/29/1950 (70 y.o. Hessie Diener Primary Care Provider: [name] Other Clinician: Referring Provider: Treating Provider/Extender: Lanier Clam in Treatment: 7 Active Problems ICD-10 Encounter Code Description Active Date MDM Diagnosis E11.621 Type 2 diabetes mellitus with foot ulcer 01/22/2021 No Yes L97.518 Non-pressure chronic ulcer of other part of right foot with other specified 01/22/2021 No Yes severity L97.511 Non-pressure chronic ulcer of other part of right foot limited to breakdown of 01/22/2021 No Yes skin M86.371 Chronic multifocal osteomyelitis, right ankle and foot 01/22/2021 No Yes E11.621 Type 2 diabetes mellitus with foot ulcer 01/22/2021 No Yes Inactive Problems Resolved Problems Electronic Signature(s) Signed: 03/13/2021 5:31:22 PM By: Linton Ham MD Entered By: Linton Ham on 03/13/2021 11:29:04 -------------------------------------------------------------------------------- Progress Note Details Patient Name: Date of Service: [name] Hawthorne B. 03/13/2021 10:30 A M Medical Record Number: [id number] Patient Account Number: [id number] Date of Birth/Sex: Treating RN: 10/27/1950 (70 y.o. Hessie Diener Primary Care Provider: [name] Other Clinician: Referring Provider: Treating Provider/Extender: Lanier Clam in Treatment: 7 Subjective History of Present Illness (HPI) ADMISSION 01/22/2021 This is a 70 year old man with type 2 diabetes and diabetic neuropathy on metformin. He has been referred to our clinic from the wound care center in [facility] by Dr. Adelina Mings for consideration of hyperbaric oxygen therapy. Looking through [facility] health link the patient has been followed by Dr. Rocco Serene since the fall of last year at which time he had a Wagner grade 2 wound on the plantar aspect of the right great toe. This deteriorated according to the patient in June  when the toe began rapidly more swollen. Ultimately he required admission to hospital from 01/01/2021 through 01/07/2021 at which time he had streaking erythema up his foot. He had an MRI done on 12/26/2020 which showed medial plantar ulceration of the great toe to bone with diffuse soft  tissue swelling of the great toe with enhancement. No fluid collection or hematoma there was osteomyelitis of the first proximal and distal phalanx no abscess. He also had small erosions of the first metatarsal head and first TMT joint suspicious for gout. Cultures grew group B strep and Prevotella. He was treated with IV Invanz and p.o. ciprofloxacin. In fact he was just stopped of the ciprofloxacin by infectious disease today although he remains on IV Invanz until the beginning of August. He is tolerating this well. There is been marked improvement in his wounds and he was referred for consideration of additional hyperbaric oxygen therapy. 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 Surgically removed in 2018 ABIs were done on 05/06/2020 which showed noncompressible waveforms ABIs were biphasic at the ankles bilaterally. TBI's were not done 01/29/2021; patient is here for follow-up from his admission last week. In the interim he started HBO yesterday which he tolerated well. He is still on his IV antibiotics [Invanz]. We have been using endoform. He has wounds on the right great toe x3 and an abrasion injury on the dorsal right third toe 8/18; he has completed 18 hyperbaric treatments. This is out of 40. He has completed his Invanz as of about 2 weeks ago PICC line was removed. The only wound remaining on his right first toe was on the plantar aspect. The lateral and and dorsal wounds are  closed 8/26; patient has completed 24 treatments. Wound at 0.1 x 0.1 x 0.1. Only remaining area on the plantar aspect of the right great toe. Much improved 9/1; the patient's involved right great toe wound essentially almost closed. I given him permission to graduate back into his usual footwear. He will have to continue to pad this area both with an insole in his shoe and perhaps an adherent foam dressing on the plantar toe on the right. Objective Constitutional Sitting or standing Blood Pressure is within target range for patient.. Slightly bradycardic. Respirations regular, non-labored and within target range.. Temperature is normal and within the target range for the patient.Marland Kitchen Appears in no distress. Vitals Time Taken: 10:31 AM, Height: 73 in, Weight: 222 lbs, BMI: 29.3, Temperature: 98.0 F, Pulse: 56 bpm, Respiratory Rate: 18 breaths/min, Blood Pressure: 120/66 mmHg, Capillary Blood Glucose: 141 mg/dl. General Notes: Wound exam; the multiple open areas that were on his left great toe with coexistent osteomyelitis of essentially all close down still some eschar on the plantar surface there is no erythema. No palpable abnormality of the interphalangeal joint on the right no effusion in that area. Integumentary (Hair, Skin) Wound #3 status is Open. Original cause of wound was Gradually Appeared. The date acquired was: 12/11/2020. The wound has been in treatment 7 weeks. The wound is located on the [employer]. The wound measures 0cm length x 0cm width x 0cm depth; 0cm^2 area and 0cm^3 volume. There is no oe tunneling or undermining noted. There is a none present amount of drainage noted. The wound margin is distinct with the outline attached to the wound base. There is no granulation within the wound bed. There is no necrotic tissue within the wound bed. Assessment Active Problems ICD-10 Type 2 diabetes mellitus with foot ulcer Non-pressure chronic ulcer of other part of right foot  with other specified severity Non-pressure chronic ulcer of other part  of right foot limited to breakdown of skin Chronic multifocal osteomyelitis, right ankle and foot Type 2 diabetes mellitus with foot ulcer Plan Follow-up Appointments: Return Appointment in 1 week. - with Dr. Dellia Nims Bathing/ Shower/ Hygiene: May shower and wash wound with soap and water. - prior to dressing change Off-Loading: Open toe surgical shoe to: - right foot Hyperbaric Oxygen Therapy: Evaluate for HBO Therapy Indication: - Wagner 3 diabetic ulcer right great toe If appropriate for treatment, begin HBOT per protocol: 2.5 ATA for 90 Minutes with 2 Five (5) Minute Air Breaks T Number of Treatments: - 40 otal One treatments per day (delivered Monday through Friday unless otherwise specified in Special Instructions below): Finger stick Blood Glucose Pre- and Post- HBOT Treatment. Follow Hyperbaric Oxygen Glycemia Protocol Afrin (Oxymetazoline HCL) 0.05% nasal spray - 1 spray in both nostrils daily as needed prior to HBO treatment for difficulty clearing ears WOUND #3: - T Great Wound Laterality: Plantar, Right oe Cleanser: Soap and Water 1 x Per Day/7 Days Discharge Instructions: May shower and wash wound with dial antibacterial soap and water prior to dressing change. Secondary Dressing: Optifoam Non-Adhesive Dressing, 4x4 in 1 x Per Day/7 Days Discharge Instructions: cut to make foam donut to offload Secured With: Conforming Stretch Gauze Bandage, Sterile 2x75 (in/in) 1 x Per Day/7 Days Discharge Instructions: Secure with stretch gauze as directed. 1. We have had good resolution of the draining sinuses which were this patient's original wound 2. I would continue further hyperbarics nevertheless since the goal here is to give [place] the best chance of eradicating the underlying bone infection. He still has 12 more dives. 3. I have given him permission to graduate into normal footwear with insoles and padding on  the plantar left great toe Electronic Signature(s) Signed: 03/13/2021 5:31:22 PM By: Linton Ham MD Entered By: Linton Ham on 03/13/2021 11:38:35 -------------------------------------------------------------------------------- SuperBill Details Patient Name: Date of Service: [name] [name] 03/13/2021 Medical Record Number: [id number] Patient Account Number: [id number] Date of Birth/Sex: Treating RN: 06-26-1951 (70 y.o. [name], [name] Primary Care Provider: [name] Other Clinician: Referring Provider: Treating Provider/Extender: Lanier Clam in Treatment: 7 Diagnosis Coding ICD-10 Codes Code Description [phone] Type 2 diabetes mellitus with foot ulcer L97.518 Non-pressure chronic ulcer of other part of right foot with other specified severity L97.511 Non-pressure chronic ulcer of other part of right foot limited to breakdown of skin M86.371 Chronic multifocal osteomyelitis, right ankle and foot E11.621 Type 2 diabetes mellitus with foot ulcer Facility Procedures CPT4 Code: [id number] Description: 99213 - WOUND CARE VISIT-LEV 3 EST PT Modifier: Quantity: 1 Physician Procedures : CPT4 Code Description Modifier [id number] - WC PHYS LEVEL 3 - EST PT ICD-10 Diagnosis Description L97.518 Non-pressure chronic ulcer of other part of right foot with other specified severity M86.371 Chronic multifocal osteomyelitis, right ankle and  foot Quantity: 1 Electronic Signature(s) Signed: 03/13/2021 5:31:22 PM By: Linton Ham MD Entered By: Linton Ham on 03/13/2021 11:38:55

## 2021-03-14 ENCOUNTER — Encounter (HOSPITAL_BASED_OUTPATIENT_CLINIC_OR_DEPARTMENT_OTHER): Payer: Medicare HMO | Admitting: Internal Medicine

## 2021-03-14 ENCOUNTER — Other Ambulatory Visit: Payer: Self-pay

## 2021-03-14 DIAGNOSIS — E11621 Type 2 diabetes mellitus with foot ulcer: Secondary | ICD-10-CM | POA: Diagnosis not present

## 2021-03-14 DIAGNOSIS — G4733 Obstructive sleep apnea (adult) (pediatric): Secondary | ICD-10-CM | POA: Diagnosis not present

## 2021-03-14 DIAGNOSIS — I2510 Atherosclerotic heart disease of native coronary artery without angina pectoris: Secondary | ICD-10-CM | POA: Diagnosis not present

## 2021-03-14 DIAGNOSIS — L97511 Non-pressure chronic ulcer of other part of right foot limited to breakdown of skin: Secondary | ICD-10-CM

## 2021-03-14 DIAGNOSIS — M86371 Chronic multifocal osteomyelitis, right ankle and foot: Secondary | ICD-10-CM

## 2021-03-14 DIAGNOSIS — I252 Old myocardial infarction: Secondary | ICD-10-CM | POA: Diagnosis not present

## 2021-03-14 DIAGNOSIS — L97518 Non-pressure chronic ulcer of other part of right foot with other specified severity: Secondary | ICD-10-CM

## 2021-03-14 DIAGNOSIS — E1142 Type 2 diabetes mellitus with diabetic polyneuropathy: Secondary | ICD-10-CM | POA: Diagnosis not present

## 2021-03-14 DIAGNOSIS — E1122 Type 2 diabetes mellitus with diabetic chronic kidney disease: Secondary | ICD-10-CM | POA: Diagnosis not present

## 2021-03-14 DIAGNOSIS — E1151 Type 2 diabetes mellitus with diabetic peripheral angiopathy without gangrene: Secondary | ICD-10-CM | POA: Diagnosis not present

## 2021-03-14 LAB — GLUCOSE, CAPILLARY
Glucose-Capillary: 149 mg/dL — ABNORMAL HIGH (ref 70–99)
Glucose-Capillary: 121 mg/dL — ABNORMAL HIGH (ref 70–99)

## 2021-03-14 NOTE — Progress Notes (Signed)
[name] [name] ([id number]) Visit Report for 03/13/2021 Arrival Information Details Patient Name: Date of Service: [name] [name] 03/13/2021 8:00 A M Medical Record Number: [id number] Patient Account Number: [id number] Date of Birth/Sex: Treating RN: 01/25/1951 (70 y.o. [name] [name], [name] Primary Care Dwaine Pringle: [name] Other Clinician: Donavan Burnet Referring Thao Vanover: Treating Jayr Lupercio/Extender: Lanier Clam in Treatment: 7 Visit Information History Since Last Visit All ordered tests and consults were completed: Yes Patient Arrived: Ambulatory Added or deleted any medications: No Arrival Time: 07:40 Any new allergies or adverse reactions: No Accompanied By: self Had a fall or experienced change in No Transfer Assistance: None activities of daily living that may affect Patient Identification Verified: Yes risk of falls: Secondary Verification Process Completed: Yes Signs or symptoms of abuse/neglect since last visito No Patient Requires Transmission-Based Precautions: No Hospitalized since last visit: No Patient Has Alerts: Yes Implantable device outside of the clinic excluding No cellular tissue based products placed in the center since last visit: Pain Present Now: No Electronic Signature(s) Signed: 03/14/2021 4:26:36 PM By: Donavan Burnet EMT Entered By: Donavan Burnet on 03/13/2021 10:41:35 -------------------------------------------------------------------------------- Encounter Discharge Information Details Patient Name: Date of Service: [name] Hawthorne B. 03/13/2021 8:00 A M Medical Record Number: [id number] Patient Account Number: [id number] Date of Birth/Sex: Treating RN: January 11, 1951 (70 y.o. [name] [name] Primary Care Shamanda Len: [name] Other Clinician: Donavan Burnet Referring Simrat Kendrick: Treating Yissel Habermehl/Extender: Lanier Clam in Treatment: 7 Encounter Discharge Information  Items Discharge Condition: Stable Ambulatory Status: Ambulatory Discharge Destination: Other (Note Required) Transportation: Other Accompanied By: self Schedule Follow-up Appointment: No Clinical Summary of Care: Notes Wound Care Encounter after HBO visit. Electronic Signature(s) Signed: 03/14/2021 4:26:36 PM By: Donavan Burnet EMT Entered By: Donavan Burnet on 03/13/2021 10:48:12 -------------------------------------------------------------------------------- Vitals Details Patient Name: Date of Service: [name] Hawthorne B. 03/13/2021 8:00 A M Medical Record Number: [id number] Patient Account Number: [id number] Date of Birth/Sex: Treating RN: 25-Mar-1951 (70 y.o. [name] [name], [name] Primary Care Lakely Elmendorf: [name] Other Clinician: Donavan Burnet Referring Bethanie Bloxom: Treating Kajal Scalici/Extender: Lanier Clam in Treatment: 7 Vital Signs Time Taken: 08:10 Temperature (F): 97.8 Height (in): 73 Pulse (bpm): 52 Weight (lbs): 222 Respiratory Rate (breaths/min): 18 Body Mass Index (BMI): 29.3 Blood Pressure (mmHg): 151/62 Capillary Blood Glucose (mg/dl): 140 Reference Range: 80 - 120 mg / dl Airway Pulse Oximetry (%): 99 Electronic Signature(s) Signed: 03/14/2021 4:26:36 PM By: Donavan Burnet EMT Entered By: Donavan Burnet on 03/13/2021 10:42:17

## 2021-03-14 NOTE — Progress Notes (Addendum)
[name] [name], [name] [name] ([id number]) Visit Report for 03/14/2021 Arrival Information Details Patient Name: Date of Service: [name] [name], [name] [name] 03/14/2021 8:00 A M Medical Record Number: [id number] Patient Account Number: [id number] Date of Birth/Sex: Treating RN: [birth year] (70 y.o. [name] [name], Vaughan Basta Primary Care Brentlee Delage: [name] Other Clinician: Donavan Burnet Referring Tondra Reierson: Treating Talor Desrosiers/Extender: Oren Section in Treatment: 7 Visit Information History Since Last Visit All ordered tests and consults were completed: Yes Patient Arrived: Ambulatory Added or deleted any medications: No Arrival Time: 07:30 Any new allergies or adverse reactions: No Accompanied By: self Had a fall or experienced change in No Transfer Assistance: None activities of daily living that may affect Patient Identification Verified: Yes risk of falls: Secondary Verification Process Completed: Yes Signs or symptoms of abuse/neglect since last visito No Patient Requires Transmission-Based Precautions: No Hospitalized since last visit: No Patient Has Alerts: Yes Implantable device outside of the clinic excluding No cellular tissue based products placed in the center since last visit: Pain Present Now: No Electronic Signature(s) Signed: 03/14/2021 4:26:36 PM By: Donavan Burnet EMT Entered By: Donavan Burnet on 03/14/2021 08:06:06 -------------------------------------------------------------------------------- Encounter Discharge Information Details Patient Name: Date of Service: [name] Hawthorne B. 03/14/2021 8:00 A M Medical Record Number: [id number] Patient Account Number: [id number] Date of Birth/Sex: Treating RN: 19-Jul-1950 (70 y.o. [name] [name] Primary Care Adriana Quinby: [name] Other Clinician: Donavan Burnet Referring Khaden Gater: Treating Mcdonald Reiling/Extender: Oren Section in Treatment: 7 Encounter Discharge Information  Items Discharge Condition: Stable Ambulatory Status: Ambulatory Discharge Destination: Other (Note Required) Transportation: Private Auto Accompanied By: self Schedule Follow-up Appointment: No Clinical Summary of Care: Notes Patient goes to work after treatment Electronic Signature(s) Signed: 04/09/2021 3:50:33 PM By: Donavan Burnet EMT Previous Signature: 03/14/2021 4:26:36 PM Version By: Donavan Burnet EMT Entered By: Donavan Burnet on 03/21/2021 12:27:00 -------------------------------------------------------------------------------- Redan Details Patient Name: Date of Service: [name] Hawthorne B. 03/14/2021 8:00 A M Medical Record Number: [id number] Patient Account Number: [id number] Date of Birth/Sex: Treating RN: February 09, 1951 (70 y.o. [name] [name] Primary Care Anjanette Gilkey: [name] Other Clinician: Donavan Burnet Referring Luverna Degenhart: Treating Uzoma Vivona/Extender: Oren Section in Treatment: 7 Vital Signs Time Taken: 08:03 Temperature (F): 97.8 Height (in): 73 Pulse (bpm): 49 Weight (lbs): 222 Respiratory Rate (breaths/min): 18 Body Mass Index (BMI): 29.3 Blood Pressure (mmHg): 122/67 Capillary Blood Glucose (mg/dl): 149 Reference Range: 80 - 120 mg / dl Electronic Signature(s) Signed: 03/14/2021 4:26:36 PM By: Donavan Burnet EMT Entered By: Donavan Burnet on 03/14/2021 [id number]

## 2021-03-14 NOTE — Progress Notes (Signed)
[name], [name] ([id number]) Visit Report for 03/13/2021 SuperBill Details Patient Name: Date of Service: [name] [name], [name] [name] 03/13/2021 Medical Record Number: [id number] Patient Account Number: [id number] Date of Birth/Sex: Treating RN: 1951-04-11 (70 y.o. [name], [name] Primary Care Provider: [name] Other Clinician: Donavan Burnet Referring Provider: Treating Provider/Extender: Lanier Clam in Treatment: 7 Diagnosis Coding ICD-10 Codes Code Description [phone] Type 2 diabetes mellitus with foot ulcer L97.518 Non-pressure chronic ulcer of other part of right foot with other specified severity L97.511 Non-pressure chronic ulcer of other part of right foot limited to breakdown of skin M86.371 Chronic multifocal osteomyelitis, right ankle and foot E11.621 Type 2 diabetes mellitus with foot ulcer Facility Procedures CPT4 Code Description Modifier Quantity [id number] G0277-(Facility Use Only) HBOT full body chamber, 104mn , 4 ICD-10 Diagnosis Description E11.621 Type 2 diabetes mellitus with foot ulcer L97.518 Non-pressure chronic ulcer of other part of right foot with other specified severity L97.511 Non-pressure chronic ulcer of other part of right foot limited to breakdown of skin M86.371 Chronic multifocal osteomyelitis, right ankle and foot Physician Procedures Quantity CPT4 Code Description Modifier [id number] PHYS HYPERBARIC OXYGEN THERAPY 1 ICD-10 Diagnosis Description E11.621 Type 2 diabetes mellitus with foot ulcer L97.518 Non-pressure chronic ulcer of other part of right foot with other specified severity L97.511 Non-pressure chronic ulcer of other part of right foot limited to breakdown of skin M86.371 Chronic multifocal osteomyelitis, right ankle and foot Electronic Signature(s) Signed: 03/13/2021 5:31:22 PM By: RLinton HamMD Signed: 03/14/2021 4:26:36 PM By: SDonavan BurnetEMT Entered By: SDonavan Burneton 03/13/2021  10:46:13

## 2021-03-14 NOTE — Progress Notes (Signed)
JERRE, MORENZ ([id number]) Visit Report for 03/12/2021 SuperBill Details Patient Name: Date of Service: [name] [name], [name] [name] 03/12/2021 Medical Record Number: [id number] Patient Account Number: [id number] Date of Birth/Sex: Treating RN: 04-Aug-1950 (70 y.o. [name] [name] Primary Care Provider: [name] Other Clinician: Donavan Burnet Referring Provider: Treating Provider/Extender: Tally Joe in Treatment: 7 Diagnosis Coding ICD-10 Codes Code Description [phone] Type 2 diabetes mellitus with foot ulcer L97.518 Non-pressure chronic ulcer of other part of right foot with other specified severity L97.511 Non-pressure chronic ulcer of other part of right foot limited to breakdown of skin M86.371 Chronic multifocal osteomyelitis, right ankle and foot E11.621 Type 2 diabetes mellitus with foot ulcer Facility Procedures CPT4 Code Description Modifier Quantity [id number] G0277-(Facility Use Only) HBOT full body chamber, 37mn , 4 ICD-10 Diagnosis Description E11.621 Type 2 diabetes mellitus with foot ulcer L97.518 Non-pressure chronic ulcer of other part of right foot with other specified severity L97.511 Non-pressure chronic ulcer of other part of right foot limited to breakdown of skin M86.371 Chronic multifocal osteomyelitis, right ankle and foot Physician Procedures Quantity CPT4 Code Description Modifier [id number] PHYS HYPERBARIC OXYGEN THERAPY 1 ICD-10 Diagnosis Description E11.621 Type 2 diabetes mellitus with foot ulcer L97.518 Non-pressure chronic ulcer of other part of right foot with other specified severity L97.511 Non-pressure chronic ulcer of other part of right foot limited to breakdown of skin M86.371 Chronic multifocal osteomyelitis, right ankle and foot Electronic Signature(s) Signed: 03/12/2021 5:21:16 PM By: SWorthy KeelerPA-C Signed: 03/14/2021 4:18:55 PM By: SDonavan BurnetEMT Entered By: SDonavan Burneton  03/12/2021 10:30:27

## 2021-03-14 NOTE — Progress Notes (Signed)
[name] [name] ([id number]) Visit Report for 03/13/2021 HBO Details Patient Name: Date of Service: [name] [name], [name] [name] 03/13/2021 8:00 A M Medical Record Number: [id number] Patient Account Number: [id number] Date of Birth/Sex: Treating RN: 01-18-1951 (70 y.o. [name] [name], [name] Primary Care Lysha Schrade: [name] Other Clinician: Donavan Burnet Referring Srihith Aquilino: Treating Tyeasha Ebbs/Extender: Lanier Clam in Treatment: 7 HBO Treatment Course Details Treatment Course Number: 1 Ordering Trayvion Embleton: Linton Ham T Treatments Ordered: otal 40 HBO Treatment Start Date: 01/27/2021 HBO Indication: Diabetic Ulcer(s) of the Lower Extremity HBO Treatment Details Treatment Number: 29 Patient Type: Outpatient Chamber Type: Monoplace Chamber Serial #: [id number] Treatment Protocol: 2.5 ATA with 90 minutes oxygen, with two 5 minute air breaks Treatment Details Compression Rate Down: 2.5 psi / minute De-Compression Rate Up: 2.5 psi / minute A breaks and breathing ir Compress Tx Pressure periods Decompress Decompress Begins Reached (leave unused spaces Begins Ends blank) Chamber Pressure (ATA 1 2.5 2.5 2.5 2.5 2.5 - - 2.5 1 ) Clock Time (24 hr) 08:16 08:25 08:55 09:00 09:30 09:35 - - 10:05 10:14 Treatment Length: 118 (minutes) Treatment Segments: 4 Vital Signs Capillary Blood Glucose Reference Range: 80 - 120 mg / dl HBO Diabetic Blood Glucose Intervention Range: <131 mg/dl or >249 mg/dl Type: Time Vitals Blood Pulse: Respiratory Capillary Blood Glucose Pulse Action Temperature: Taken: Pressure: Rate: Glucose (mg/dl): Meter #: Oximetry (%) Taken: Pre 08:10 151/62 52 18 97.8 140 99 Post 10:17 150/59 49 18 97.6 118 discharge per protocol Treatment Response Treatment Toleration: Well Treatment Completion Status: Treatment Completed without Adverse Event Additional Procedure Documentation Tissue Sevierity: Limited to breakdown of skin Khristen Cheyney Notes No  concerns for treatment given. Patient was also seen for wound care evaluation Physician HBO Attestation: I certify that I supervised this HBO treatment in accordance with Medicare guidelines. A trained emergency response team is readily available per Yes hospital policies and procedures. Continue HBOT as ordered. Yes Electronic Signature(s) Signed: 03/13/2021 5:31:22 PM By: Linton Ham MD Entered By: Linton Ham on 03/13/2021 17:05:54 -------------------------------------------------------------------------------- HBO Safety Checklist Details Patient Name: Date of Service: [name] Hawthorne B. 03/13/2021 8:00 A M Medical Record Number: [id number] Patient Account Number: [id number] Date of Birth/Sex: Treating RN: 1951/06/19 (70 y.o. [name] [name], [name] Primary Care Kimmberly Wisser: [name] Other Clinician: Donavan Burnet Referring [place] Kappes: Treating Tony Friscia/Extender: Lanier Clam in Treatment: 7 HBO Safety Checklist Items Safety Checklist Consent Form Signed Patient voided / foley secured and emptied When did you last eato 0700 Last dose of injectable or oral agent metformin after yesterday's HBO Tx Ostomy pouch emptied and vented if applicable NA All implantable devices assessed, documented and approved NA Intravenous access site secured and place NA Valuables secured Linens and cotton and cotton/polyester blend (less than 51% polyester) Personal oil-based products / skin lotions / body lotions removed Wigs or hairpieces removed NA Smoking or tobacco materials removed NA Books / newspapers / magazines / loose paper removed Cologne, aftershave, perfume and deodorant removed Jewelry removed (may wrap wedding band) Make-up removed NA Hair care products removed NA Battery operated devices (external) removed Heating patches and chemical warmers removed Titanium eyewear removed Nail polish cured greater than 10 hours NA Casting material cured  greater than 10 hours NA Hearing aids removed NA Loose dentures or partials removed NA Prosthetics have been removed NA Patient demonstrates correct use of air break device (if applicable) Patient concerns have been addressed Patient grounding bracelet on and cord attached to chamber Specifics for Inpatients (complete in  addition to above) Medication sheet sent with patient NA Intravenous medications needed or due during therapy sent with patient NA Drainage tubes (e.g. nasogastric tube or chest tube secured and vented) NA Endotracheal or Tracheotomy tube secured NA Cuff deflated of air and inflated with saline NA Airway suctioned NA Notes Paper version used today at approximately 0815 Electronic Signature(s) Signed: 03/14/2021 4:26:36 PM By: Donavan Burnet EMT Entered By: Donavan Burnet on 03/13/2021 10:45:44

## 2021-03-18 ENCOUNTER — Encounter (HOSPITAL_BASED_OUTPATIENT_CLINIC_OR_DEPARTMENT_OTHER): Payer: Medicare HMO | Admitting: Internal Medicine

## 2021-03-18 ENCOUNTER — Other Ambulatory Visit: Payer: Self-pay

## 2021-03-18 DIAGNOSIS — I252 Old myocardial infarction: Secondary | ICD-10-CM | POA: Diagnosis not present

## 2021-03-18 DIAGNOSIS — M86371 Chronic multifocal osteomyelitis, right ankle and foot: Secondary | ICD-10-CM | POA: Diagnosis not present

## 2021-03-18 DIAGNOSIS — E11621 Type 2 diabetes mellitus with foot ulcer: Secondary | ICD-10-CM | POA: Diagnosis not present

## 2021-03-18 DIAGNOSIS — E1122 Type 2 diabetes mellitus with diabetic chronic kidney disease: Secondary | ICD-10-CM | POA: Diagnosis not present

## 2021-03-18 DIAGNOSIS — L97518 Non-pressure chronic ulcer of other part of right foot with other specified severity: Secondary | ICD-10-CM | POA: Diagnosis not present

## 2021-03-18 DIAGNOSIS — L97511 Non-pressure chronic ulcer of other part of right foot limited to breakdown of skin: Secondary | ICD-10-CM | POA: Diagnosis not present

## 2021-03-18 DIAGNOSIS — G4733 Obstructive sleep apnea (adult) (pediatric): Secondary | ICD-10-CM | POA: Diagnosis not present

## 2021-03-18 DIAGNOSIS — E1142 Type 2 diabetes mellitus with diabetic polyneuropathy: Secondary | ICD-10-CM | POA: Diagnosis not present

## 2021-03-18 DIAGNOSIS — I2510 Atherosclerotic heart disease of native coronary artery without angina pectoris: Secondary | ICD-10-CM | POA: Diagnosis not present

## 2021-03-18 DIAGNOSIS — E1151 Type 2 diabetes mellitus with diabetic peripheral angiopathy without gangrene: Secondary | ICD-10-CM | POA: Diagnosis not present

## 2021-03-18 LAB — GLUCOSE, CAPILLARY
Glucose-Capillary: 145 mg/dL — ABNORMAL HIGH (ref 70–99)
Glucose-Capillary: 111 mg/dL — ABNORMAL HIGH (ref 70–99)

## 2021-03-18 NOTE — Progress Notes (Signed)
[name] [name] ([id number]) Visit Report for 03/13/2021 Arrival Information Details Patient Name: Date of Service: [name] [name], [name] [name] 03/13/2021 10:30 A M Medical Record Number: [id number] Patient Account Number: [id number] Date of Birth/Sex: Treating RN: 1950/08/25 (70 y.o. Hessie Diener Primary Care Yetta Marceaux: [name] Other Clinician: Referring Malique Driskill: Treating Naeema Patlan/Extender: Lanier Clam in Treatment: 7 Visit Information History Since Last Visit Added or deleted any medications: No Patient Arrived: Ambulatory Any new allergies or adverse reactions: No Arrival Time: 10:31 Had a fall or experienced change in No Accompanied By: self activities of daily living that may affect Transfer Assistance: None risk of falls: Patient Identification Verified: Yes Signs or symptoms of abuse/neglect since last visito No Secondary Verification Process Completed: Yes Hospitalized since last visit: No Patient Requires Transmission-Based Precautions: No Implantable device outside of the clinic excluding No Patient Has Alerts: Yes cellular tissue based products placed in the center since last visit: Has Dressing in Place as Prescribed: Yes Pain Present Now: No Electronic Signature(s) Signed: 03/18/2021 7:50:07 AM By: Sandre Kitty Entered By: Sandre Kitty on 03/13/2021 10:31:32 -------------------------------------------------------------------------------- Clinic Level of Care Assessment Details Patient Name: Date of Service: [name] [name] 03/13/2021 10:30 A M Medical Record Number: [id number] Patient Account Number: [id number] Date of Birth/Sex: Treating RN: 1950-08-12 (70 y.o. [name], [name] Primary Care Rasul Decola: [name] Other Clinician: Referring Rozanna Cormany: Treating Rithwik Schmieg/Extender: Lanier Clam in Treatment: 7 Clinic Level of Care Assessment Items TOOL 4 Quantity Score X- 1 0 Use when only an EandM is  performed on FOLLOW-UP visit ASSESSMENTS - Nursing Assessment / Reassessment X- 1 10 Reassessment of Co-morbidities (includes updates in patient status) X- 1 5 Reassessment of Adherence to Treatment Plan ASSESSMENTS - Wound and Skin A ssessment / Reassessment X - Simple Wound Assessment / Reassessment - one wound 1 5 '[]'  - 0 Complex Wound Assessment / Reassessment - multiple wounds X- 1 10 Dermatologic / Skin Assessment (not related to wound area) ASSESSMENTS - Focused Assessment X- 1 5 Circumferential Edema Measurements - multi extremities X- 1 10 Nutritional Assessment / Counseling / Intervention '[]'  - 0 Lower Extremity Assessment (monofilament, tuning fork, pulses) '[]'  - 0 Peripheral Arterial Disease Assessment (using hand held doppler) ASSESSMENTS - Ostomy and/or Continence Assessment and Care '[]'  - 0 Incontinence Assessment and Management '[]'  - 0 Ostomy Care Assessment and Management (repouching, etc.) PROCESS - Coordination of Care X - Simple Patient / Family Education for ongoing care 1 15 '[]'  - 0 Complex (extensive) Patient / Family Education for ongoing care X- 1 10 Staff obtains Consents, Records, T Results / Process Orders est '[]'  - 0 Staff telephones HHA, Nursing Homes / Clarify orders / etc '[]'  - 0 Routine Transfer to another Facility (non-emergent condition) '[]'  - 0 Routine Hospital Admission (non-emergent condition) '[]'  - 0 New Admissions / Biomedical engineer / Ordering NPWT Apligraf, etc. , '[]'  - 0 Emergency Hospital Admission (emergent condition) X- 1 10 Simple Discharge Coordination '[]'  - 0 Complex (extensive) Discharge Coordination PROCESS - Special Needs '[]'  - 0 Pediatric / Minor Patient Management '[]'  - 0 Isolation Patient Management '[]'  - 0 Hearing / Language / Visual special needs '[]'  - 0 Assessment of Community assistance (transportation, D/C planning, etc.) '[]'  - 0 Additional assistance / Altered mentation '[]'  - 0 Support Surface(s) Assessment  (bed, cushion, seat, etc.) INTERVENTIONS - Wound Cleansing / Measurement '[]'  - 0 Simple Wound Cleansing - one wound '[]'  - 0 Complex Wound Cleansing - multiple wounds X-  1 5 Wound Imaging (photographs - any number of wounds) '[]'  - 0 Wound Tracing (instead of photographs) X- 1 5 Simple Wound Measurement - one wound '[]'  - 0 Complex Wound Measurement - multiple wounds INTERVENTIONS - Wound Dressings X - Small Wound Dressing one or multiple wounds 1 10 '[]'  - 0 Medium Wound Dressing one or multiple wounds '[]'  - 0 Large Wound Dressing one or multiple wounds '[]'  - 0 Application of Medications - topical '[]'  - 0 Application of Medications - injection INTERVENTIONS - Miscellaneous '[]'  - 0 External ear exam '[]'  - 0 Specimen Collection (cultures, biopsies, blood, body fluids, etc.) '[]'  - 0 Specimen(s) / Culture(s) sent or taken to Lab for analysis '[]'  - 0 Patient Transfer (multiple staff / Civil Service fast streamer / Similar devices) '[]'  - 0 Simple Staple / Suture removal (25 or less) '[]'  - 0 Complex Staple / Suture removal (26 or more) '[]'  - 0 Hypo / Hyperglycemic Management (close monitor of Blood Glucose) '[]'  - 0 Ankle / Brachial Index (ABI) - do not check if billed separately X- 1 5 Vital Signs Has the patient been seen at the hospital within the last three years: Yes Total Score: 105 Level Of Care: New/Established - Level 3 Electronic Signature(s) Signed: 03/13/2021 5:36:38 PM By: Deon Pilling Entered By: Deon Pilling on 03/13/2021 10:41:44 -------------------------------------------------------------------------------- Encounter Discharge Information Details Patient Name: Date of Service: [name] Hawthorne B. 03/13/2021 10:30 A M Medical Record Number: [id number] Patient Account Number: [id number] Date of Birth/Sex: Treating RN: 1950-08-24 (70 y.o. Hessie Diener Primary Care Shaneese Tait: [name] Other Clinician: Referring Bailee Metter: Treating Purcell Jungbluth/Extender: Lanier Clam in Treatment: 7 Encounter Discharge Information Items Discharge Condition: Stable Ambulatory Status: Ambulatory Discharge Destination: Home Transportation: Private Auto Accompanied By: self Schedule Follow-up Appointment: Yes Clinical Summary of Care: Electronic Signature(s) Signed: 03/13/2021 5:36:38 PM By: Deon Pilling Entered By: Deon Pilling on 03/13/2021 10:45:26 -------------------------------------------------------------------------------- Lower Extremity Assessment Details Patient Name: Date of Service: [name] [name], [name] [name] 03/13/2021 10:30 A M Medical Record Number: [id number] Patient Account Number: [id number] Date of Birth/Sex: Treating RN: Jan 20, 1951 (70 y.o. Hessie Diener Primary Care Brodee Mauritz: [name] Other Clinician: Referring Gurbani Figge: Treating Shandra Szymborski/Extender: Lanier Clam in Treatment: 7 Edema Assessment Assessed: Shirlyn Goltz: No] Patrice Paradise: Yes] Edema: [Left: N] [Right: o] Calf Left: Right: Point of Measurement: From Medial Instep 35 cm Ankle Left: Right: Point of Measurement: From Medial Instep 24.5 cm Vascular Assessment Pulses: Dorsalis Pedis Palpable: [Right:Yes] Electronic Signature(s) Signed: 03/13/2021 5:36:38 PM By: Deon Pilling Entered By: Deon Pilling on 03/13/2021 10:36:27 -------------------------------------------------------------------------------- Multi Wound Chart Details Patient Name: Date of Service: [name] Hawthorne B. 03/13/2021 10:30 A M Medical Record Number: [id number] Patient Account Number: [id number] Date of Birth/Sex: Treating RN: Dec 10, 1950 (70 y.o. Hessie Diener Primary Care Artemisa Sladek: [name] Other Clinician: Referring Joua Bake: Treating Philbert Ocallaghan/Extender: Lanier Clam in Treatment: 7 Vital Signs Height(in): 65 Capillary Blood Glucose(mg/dl): 141 Weight(lbs): 222 Pulse(bpm): 45 Body Mass Index(BMI): 29 Blood Pressure(mmHg):  120/66 Temperature(F): 98.0 Respiratory Rate(breaths/min): 18 Photos: [N/A:N/A] Right, Plantar T Great oe N/A N/A Wound Location: Gradually Appeared N/A N/A Wounding Event: Diabetic Wound/Ulcer of the Lower N/A N/A Primary Etiology: Extremity Hypertension, Type II Diabetes, Gout, N/A N/A Comorbid History: Osteomyelitis, Neuropathy 12/11/2020 N/A N/A Date Acquired: 7 N/A N/A Weeks of Treatment: Open N/A N/A Wound Status: 0x0x0 N/A N/A Measurements L x W x D (cm) 0 N/A N/A A (cm) : rea 0 N/A N/A Volume (cm) : 100.00% N/A  N/A % Reduction in A rea: 100.00% N/A N/A % Reduction in Volume: Grade 3 N/A N/A Classification: None Present N/A N/A Exudate A mount: Distinct, outline attached N/A N/A Wound Margin: None Present (0%) N/A N/A Granulation A mount: None Present (0%) N/A N/A Necrotic A mount: Fascia: No N/A N/A Exposed Structures: Fat Layer (Subcutaneous Tissue): No Tendon: No Muscle: No Joint: No Bone: No Large (67-100%) N/A N/A Epithelialization: Treatment Notes Wound #3 (Toe Great) Wound Laterality: Plantar, Right Cleanser Soap and Water Discharge Instruction: May shower and wash wound with dial antibacterial soap and water prior to dressing change. Peri-Wound Care Topical Primary Dressing Secondary Dressing Optifoam Non-Adhesive Dressing, 4x4 in Discharge Instruction: cut to make foam donut to offload Secured With Conforming Stretch Gauze Bandage, Sterile 2x75 (in/in) Discharge Instruction: Secure with stretch gauze as directed. Compression Wrap Compression Stockings Add-Ons Electronic Signature(s) Signed: 03/13/2021 5:31:22 PM By: Linton Ham MD Signed: 03/13/2021 5:36:38 PM By: Deon Pilling Entered By: Linton Ham on 03/13/2021 11:29:13 -------------------------------------------------------------------------------- Multi-Disciplinary Care Plan Details Patient Name: Date of Service: [name] Hawthorne B. 03/13/2021 10:30 A M Medical  Record Number: [id number] Patient Account Number: [id number] Date of Birth/Sex: Treating RN: 1950/11/06 (70 y.o. [name], [name] Primary Care Mishawn Hemann: [name] Other Clinician: Referring Andrya Roppolo: Treating Yedidya Duddy/Extender: Lanier Clam in Treatment: 7 Multidisciplinary Care Plan reviewed with physician Active Inactive HBO Nursing Diagnoses: Anxiety related to feelings of confinement associated with the hyperbaric oxygen chamber Anxiety related to knowledge deficit of hyperbaric oxygen therapy and treatment procedures Discomfort related to temperature and humidity changes inside hyperbaric chamber Potential for barotraumas to ears, sinuses, teeth, and lungs or cerebral gas embolism related to changes in atmospheric pressure inside hyperbaric oxygen chamber Potential for oxygen toxicity seizures related to delivery of 100% oxygen at an increased atmospheric pressure Potential for pulmonary oxygen toxicity related to delivery of 100% oxygen at an increased atmospheric pressure Goals: Barotrauma will be prevented during HBO2 Date Initiated: 01/22/2021 Target Resolution Date: 03/21/2021 Goal Status: Active Patient and/or family will be able to state/discuss factors appropriate to the management of their disease process during treatment Date Initiated: 01/22/2021 Date Inactivated: 03/13/2021 Target Resolution Date: 03/21/2021 Goal Status: Met Patient will tolerate the hyperbaric oxygen therapy treatment Date Initiated: 01/22/2021 Target Resolution Date: 03/21/2021 Goal Status: Active Patient will tolerate the internal climate of the chamber Date Initiated: 01/22/2021 Target Resolution Date: 03/21/2021 Goal Status: Active Patient/caregiver will verbalize understanding of HBO goals, rationale, procedures and potential hazards Date Initiated: 01/22/2021 Date Inactivated: 03/13/2021 Target Resolution Date: 03/21/2021 Goal Status: Met Signs and symptoms of pulmonary  oxygen toxicity will be recognized and promptly addressed Date Initiated: 01/22/2021 Target Resolution Date: 03/21/2021 Goal Status: Active Signs and symptoms of seizure will be recognized and promptly addressed ; seizing patients will suffer no harm Date Initiated: 01/22/2021 Target Resolution Date: 03/21/2021 Goal Status: Active Interventions: Administer a five (5) minute air break for patient if signs and symptoms of seizure appear and notify the hyperbaric physician Administer decongestants, per physician orders, prior to HBO2 Administer the correct therapeutic gas delivery based on the patients needs and limitations, per physician order Assess and provide for patients comfort related to the hyperbaric environment and equalization of middle ear Assess for signs and symptoms related to adverse events, including but not limited to confinement anxiety, pneumothorax, oxygen toxicity and baurotrauma Assess patient for any history of confinement anxiety Assess patient's knowledge and expectations regarding hyperbaric medicine and provide education related to the hyperbaric environment, goals of treatment  and prevention of adverse events Implement protocols to decrease risk of pneumothorax in high risk patients Notes: Nutrition Nursing Diagnoses: Impaired glucose control: actual or potential Potential for alteratiion in Nutrition/Potential for imbalanced nutrition Goals: Patient/caregiver agrees to and verbalizes understanding of need to use nutritional supplements and/or vitamins as prescribed Date Initiated: 01/22/2021 Date Inactivated: 02/27/2021 Target Resolution Date: 02/21/2021 Goal Status: Met Patient/caregiver will maintain therapeutic glucose control Date Initiated: 01/22/2021 Target Resolution Date: 03/28/2021 Goal Status: Active Interventions: Assess HgA1c results as ordered upon admission and as needed Assess patient nutrition upon admission and as needed per policy Provide  education on elevated blood sugars and impact on wound healing Provide education on nutrition Treatment Activities: Education provided on Nutrition : 01/22/2021 Notes: Wound/Skin Impairment Nursing Diagnoses: Impaired tissue integrity Knowledge deficit related to ulceration/compromised skin integrity Goals: Patient/caregiver will verbalize understanding of skin care regimen Date Initiated: 01/22/2021 Target Resolution Date: 03/28/2021 Goal Status: Active Interventions: Assess patient/caregiver ability to obtain necessary supplies Assess patient/caregiver ability to perform ulcer/skin care regimen upon admission and as needed Assess ulceration(s) every visit Provide education on ulcer and skin care Notes: Electronic Signature(s) Signed: 03/13/2021 5:36:38 PM By: Deon Pilling Entered By: Deon Pilling on 03/13/2021 10:37:14 -------------------------------------------------------------------------------- Pain Assessment Details Patient Name: Date of Service: [name] [name], [name] [name] 03/13/2021 10:30 A M Medical Record Number: [id number] Patient Account Number: [id number] Date of Birth/Sex: Treating RN: March 01, 1951 (70 y.o. Hessie Diener Primary Care Jhaniya Briski: [name] Other Clinician: Referring Merelyn Klump: Treating Burk Hoctor/Extender: Lanier Clam in Treatment: 7 Active Problems Location of Pain Severity and Description of Pain Patient Has Paino No Site Locations Pain Management and Medication Current Pain Management: Electronic Signature(s) Signed: 03/13/2021 5:36:38 PM By: Deon Pilling Signed: 03/18/2021 7:50:07 AM By: Sandre Kitty Entered By: Sandre Kitty on 03/13/2021 10:31:55 -------------------------------------------------------------------------------- Patient/Caregiver Education Details Patient Name: Date of Service: [name] [name] 9/1/2022andnbsp10:30 A M Medical Record Number: [id number] Patient Account Number: [id number] Date of  Birth/Gender: Treating RN: 02-08-1951 (69 y.o. Hessie Diener Primary Care Physician: [name] Other Clinician: Referring Physician: Treating Physician/Extender: Lanier Clam in Treatment: 7 Education Assessment Education Provided To: Patient Education Topics Provided Elevated Blood Sugar/ Impact on Healing: Handouts: Elevated Blood Sugars: How Do They Affect Wound Healing Methods: Explain/Verbal Responses: Reinforcements needed Electronic Signature(s) Signed: 03/13/2021 5:36:38 PM By: Deon Pilling Entered By: Deon Pilling on 03/13/2021 10:37:25 -------------------------------------------------------------------------------- Wound Assessment Details Patient Name: Date of Service: [name] [name], [name] [name] 03/13/2021 10:30 A M Medical Record Number: [id number] Patient Account Number: [id number] Date of Birth/Sex: Treating RN: [birth year] (70 y.o. [name], [name] Primary Care Cristo Ausburn: [name] Other Clinician: Referring Yoshimi Sarr: Treating Maritta Kief/Extender: Lanier Clam in Treatment: 7 Wound Status Wound Number: 3 Primary Diabetic Wound/Ulcer of the Lower Extremity Etiology: Wound Location: Right, Plantar T Great oe Wound Status: Open Wounding Event: Gradually Appeared Comorbid Hypertension, Type II Diabetes, Gout, Osteomyelitis, Date Acquired: 12/11/2020 History: Neuropathy Weeks Of Treatment: 7 Clustered Wound: No Photos Wound Measurements Length: (cm) Width: (cm) Depth: (cm) Area: (cm) Volume: (cm) 0 % Reduction in Area: 100% 0 % Reduction in Volume: 100% 0 Epithelialization: Large (67-100%) 0 Tunneling: No 0 Undermining: No Wound Description Classification: Grade 3 Wound Margin: Distinct, outline attached Exudate Amount: None Present Wound Bed Granulation Amount: None Present (0%) Exposed Structure Necrotic Amount: None Present (0%) Fascia Exposed: No Fat Layer (Subcutaneous Tissue) Exposed:  No Tendon Exposed: No Muscle Exposed: No Joint Exposed: No Bone Exposed: No Treatment Notes Wound #3 (  Toe Great) Wound Laterality: Plantar, Right Cleanser Soap and Water Discharge Instruction: May shower and wash wound with dial antibacterial soap and water prior to dressing change. Peri-Wound Care Topical Primary Dressing Secondary Dressing Optifoam Non-Adhesive Dressing, 4x4 in Discharge Instruction: cut to make foam donut to offload Secured With Conforming Stretch Gauze Bandage, Sterile 2x75 (in/in) Discharge Instruction: Secure with stretch gauze as directed. Compression Wrap Compression Stockings Add-Ons Electronic Signature(s) Signed: 03/13/2021 5:36:38 PM By: Deon Pilling Entered By: Deon Pilling on 03/13/2021 10:36:44 -------------------------------------------------------------------------------- Vitals Details Patient Name: Date of Service: [name] Hawthorne B. 03/13/2021 10:30 A M Medical Record Number: [id number] Patient Account Number: [id number] Date of Birth/Sex: Treating RN: Jan 29, 1951 (70 y.o. [name], [name] Primary Care Thales Knipple: [name] Other Clinician: Referring Birt Reinoso: Treating Shanon Seawright/Extender: Lanier Clam in Treatment: 7 Vital Signs Time Taken: 10:31 Temperature (F): 98.0 Height (in): 73 Pulse (bpm): 56 Weight (lbs): 222 Respiratory Rate (breaths/min): 18 Body Mass Index (BMI): 29.3 Blood Pressure (mmHg): 120/66 Capillary Blood Glucose (mg/dl): 141 Reference Range: 80 - 120 mg / dl Electronic Signature(s) Signed: 03/18/2021 7:50:07 AM By: Sandre Kitty Entered By: Sandre Kitty on 03/13/2021 10:31:50

## 2021-03-18 NOTE — Progress Notes (Signed)
[name] [name], [name] [name] ([id number]) Visit Report for 03/14/2021 SuperBill Details Patient Name: Date of Service: [name], [name] 03/14/2021 Medical Record Number: [id number] Patient Account Number: [id number] Date of Birth/Sex: Treating RN: [birth year] (70 y.o. [name] Primary Care Provider: [name] Other Clinician: Donavan Burnet Referring Provider: Treating Provider/Extender: Oren Section in Treatment: 7 Diagnosis Coding ICD-10 Codes Code Description [id number] Type 2 diabetes mellitus with foot ulcer L97.518 Non-pressure chronic ulcer of other part of right foot with other specified severity L97.511 Non-pressure chronic ulcer of other part of right foot limited to breakdown of skin M86.371 Chronic multifocal osteomyelitis, right ankle and foot E11.621 Type 2 diabetes mellitus with foot ulcer Facility Procedures CPT4 Code Description Modifier Quantity [id number]-(Facility Use Only) HBOT full body chamber, 75mn , 4 ICD-10 Diagnosis Description E11.621 Type 2 diabetes mellitus with foot ulcer L97.518 Non-pressure chronic ulcer of other part of right foot with other specified severity L97.511 Non-pressure chronic ulcer of other part of right foot limited to breakdown of skin M86.371 Chronic multifocal osteomyelitis, right ankle and foot Physician Procedures Quantity CPT4 Code Description Modifier [id number] PHYS HYPERBARIC OXYGEN THERAPY 1 ICD-10 Diagnosis Description E11.621 Type 2 diabetes mellitus with foot ulcer L97.518 Non-pressure chronic ulcer of other part of right foot with other specified severity L97.511 Non-pressure chronic ulcer of other part of right foot limited to breakdown of skin M86.371 Chronic multifocal osteomyelitis, right ankle and foot Electronic Signature(s) Signed: 03/14/2021 4:26:36 PM By: SDonavan BurnetEMT Signed: 03/18/2021 8:43:02 AM By: HKalman ShanDO Entered By: SDonavan Burneton  03/14/2021 10:29:32

## 2021-03-18 NOTE — Progress Notes (Signed)
[name], [name] ([id number]) Visit Report for 03/14/2021 HBO Details Patient Name: Date of Service: [name] [name], [name] [name] 03/14/2021 8:00 A M Medical Record Number: [id number] Patient Account Number: [id number] Date of Birth/Sex: Treating RN: 02-Mar-1951 (70 y.o. [name] [name] Primary Care [name] [name]: [name] [name] Other Clinician: Donavan [name] Referring [name] [name]: Treating [name] [name]/Extender: [name] [name] Section in Treatment: 7 HBO Treatment Course Details Treatment Course Number: 1 Ordering [name] [name]: [name] [name] T Treatments Ordered: otal 40 HBO Treatment Start Date: 01/27/2021 HBO Indication: Diabetic Ulcer(s) of the Lower Extremity HBO Treatment Details Treatment Number: 30 Patient Type: Outpatient Chamber Type: Monoplace Chamber Serial #: [id number] Treatment Protocol: 2.5 ATA with 90 minutes oxygen, with two 5 minute air breaks Treatment Details Compression Rate Down: 2.0 psi / minute De-Compression Rate Up: 2.5 psi / minute A breaks and breathing ir Compress Tx Pressure periods Decompress Decompress Begins Reached (leave unused spaces Begins Ends blank) Chamber Pressure (ATA 1 2.5 2.5 2.5 2.5 2.5 - - 2.5 1 ) Clock Time (24 hr) 08:15 08:26 08:56 09:01 09:31 09:36 - - 10:06 10:15 Treatment Length: 120 (minutes) Treatment Segments: 4 Vital Signs Capillary Blood Glucose Reference Range: 80 - 120 mg / dl HBO Diabetic Blood Glucose Intervention Range: <131 mg/dl or >249 mg/dl Type: Time Vitals Blood Pulse: Respiratory Capillary Blood Glucose Pulse Action Temperature: Taken: Pressure: Rate: Glucose (mg/dl): Meter #: Oximetry (%) Taken: Pre 08:03 122/67 49 18 97.8 149 Post 10:18 126/65 48 18 97.4 121 discharge per protocol Treatment Response Treatment Toleration: Well Treatment Completion Status: Treatment Completed without Adverse Event Additional Procedure Documentation Tissue Sevierity: Limited to breakdown of skin Physician HBO  Attestation: I certify that I supervised this HBO treatment in accordance with Medicare guidelines. A trained emergency response team is readily available per Yes hospital policies and procedures. Continue HBOT as ordered. Yes Electronic Signature(s) Signed: 03/18/2021 8:43:02 AM By: [name] [name] Previous Signature: 03/14/2021 4:27:40 PM Version By: [name] [name] EMT Entered By: [name] [name] on 03/18/2021 08:41:07 -------------------------------------------------------------------------------- HBO Safety Checklist Details Patient Name: Date of Service: [name] Hawthorne B. 03/14/2021 8:00 A M Medical Record Number: [id number] Patient Account Number: [id number] Date of Birth/Sex: Treating RN: 08/14/1950 (70 y.o. [name] [name] Primary Care [name] [name]: [name] [name] Other Clinician: Donavan [name] Referring [name] [name]: Treating [name] [name]/Extender: [name] [name] Section in Treatment: 7 HBO Safety Checklist Items Safety Checklist Consent Form Signed Patient voided / foley secured and emptied When did you last eato 0715 Last dose of injectable or oral agent Yesterday after HBO Tx Ostomy pouch emptied and vented if applicable NA All implantable devices assessed, documented and approved NA Intravenous access site secured and place NA Valuables secured Linens and cotton and cotton/polyester blend (less than 51% polyester) Personal oil-based products / skin lotions / body lotions removed Wigs or hairpieces removed NA Smoking or tobacco materials removed NA Books / newspapers / magazines / loose paper removed Cologne, aftershave, perfume and deodorant removed Jewelry removed (may wrap wedding band) Make-up removed NA Hair care products removed NA Battery operated devices (external) removed Heating patches and chemical warmers removed Titanium eyewear removed Nail polish cured greater than 10 hours NA Casting material cured greater than 10  hours NA Hearing aids removed NA Loose dentures or partials removed NA Prosthetics have been removed NA Patient demonstrates correct use of air break device (if applicable) Patient concerns have been addressed Patient grounding bracelet on and cord attached to chamber Specifics for Inpatients (complete in addition to above) Medication sheet sent with  patient NA Intravenous medications needed or due during therapy sent with patient NA Drainage tubes (e.g. nasogastric tube or chest tube secured and vented) NA Endotracheal or Tracheotomy tube secured NA Cuff deflated of air and inflated with saline NA Airway suctioned NA Electronic Signature(s) Signed: 03/14/2021 4:26:36 PM By: [name] [name] EMT Entered By: [name] [name] on 03/14/2021 08:11:29

## 2021-03-19 ENCOUNTER — Encounter (HOSPITAL_BASED_OUTPATIENT_CLINIC_OR_DEPARTMENT_OTHER): Payer: Medicare HMO | Admitting: Physician Assistant

## 2021-03-19 DIAGNOSIS — G4733 Obstructive sleep apnea (adult) (pediatric): Secondary | ICD-10-CM | POA: Diagnosis not present

## 2021-03-19 DIAGNOSIS — E1151 Type 2 diabetes mellitus with diabetic peripheral angiopathy without gangrene: Secondary | ICD-10-CM | POA: Diagnosis not present

## 2021-03-19 DIAGNOSIS — I2510 Atherosclerotic heart disease of native coronary artery without angina pectoris: Secondary | ICD-10-CM | POA: Diagnosis not present

## 2021-03-19 DIAGNOSIS — L97511 Non-pressure chronic ulcer of other part of right foot limited to breakdown of skin: Secondary | ICD-10-CM | POA: Diagnosis not present

## 2021-03-19 DIAGNOSIS — E1122 Type 2 diabetes mellitus with diabetic chronic kidney disease: Secondary | ICD-10-CM | POA: Diagnosis not present

## 2021-03-19 DIAGNOSIS — E1142 Type 2 diabetes mellitus with diabetic polyneuropathy: Secondary | ICD-10-CM | POA: Diagnosis not present

## 2021-03-19 DIAGNOSIS — I252 Old myocardial infarction: Secondary | ICD-10-CM | POA: Diagnosis not present

## 2021-03-19 DIAGNOSIS — L97518 Non-pressure chronic ulcer of other part of right foot with other specified severity: Secondary | ICD-10-CM | POA: Diagnosis not present

## 2021-03-19 DIAGNOSIS — M86371 Chronic multifocal osteomyelitis, right ankle and foot: Secondary | ICD-10-CM | POA: Diagnosis not present

## 2021-03-19 DIAGNOSIS — E11621 Type 2 diabetes mellitus with foot ulcer: Secondary | ICD-10-CM | POA: Diagnosis not present

## 2021-03-19 LAB — GLUCOSE, CAPILLARY
Glucose-Capillary: 143 mg/dL — ABNORMAL HIGH (ref 70–99)
Glucose-Capillary: 139 mg/dL — ABNORMAL HIGH (ref 70–99)

## 2021-03-19 NOTE — Progress Notes (Signed)
[name] [name], [name] [name] ([id number]) Visit Report for 03/19/2021 HBO Details Patient Name: Date of Service: [name] [name], [name] [name] 03/19/2021 8:00 A M Medical Record Number: [id number] Patient Account Number: [id number] Date of Birth/Sex: Treating RN: Dec 29, 1950 (70 y.o. [name] Primary Care Philisha Weinel: [name] Other Clinician: Donavan Burnet Referring Erline Siddoway: Treating Albertus Chiarelli/Extender: Tally Joe in Treatment: 8 HBO Treatment Course Details Treatment Course Number: 1 Ordering Kharizma Lesnick: Linton Ham T Treatments Ordered: otal 40 HBO Treatment Start Date: 01/27/2021 HBO Indication: Diabetic Ulcer(s) of the Lower Extremity HBO Treatment Details Treatment Number: 32 Patient Type: Outpatient Chamber Type: Monoplace Chamber Serial #: [id number] Treatment Protocol: 2.5 ATA with 90 minutes oxygen, with two 5 minute air breaks Treatment Details Compression Rate Down: 2.5 psi / minute De-Compression Rate Up: 2.5 psi / minute A breaks and breathing ir Compress Tx Pressure periods Decompress Decompress Begins Reached (leave unused spaces Begins Ends blank) Chamber Pressure (ATA 1 2.5 2.5 2.5 2.5 2.5 - - 2.5 1 ) Clock Time (24 hr) 08:08 08:18 08:48 08:53 09:23 09:28 - - 09:58 10:07 Treatment Length: 119 (minutes) Treatment Segments: 4 Vital Signs Capillary Blood Glucose Reference Range: 80 - 120 mg / dl HBO Diabetic Blood Glucose Intervention Range: <131 mg/dl or >249 mg/dl Time Vitals Blood Respiratory Capillary Blood Glucose Pulse Action Type: Pulse: Temperature: Taken: Pressure: Rate: Glucose (mg/dl): Meter #: Oximetry (%) Taken: Pre 07:57 125/56 53 18 98.4 143 Post 10:14 127/52 51 18 97.9 139 Treatment Response Treatment Toleration: Well Treatment Completion Status: Treatment Completed without Adverse Event Additional Procedure Documentation Tissue Sevierity: Limited to breakdown of skin Electronic Signature(s) Signed: 03/19/2021 5:49:01  PM By: Levan Hurst RN, BSN Signed: 03/19/2021 6:00:14 PM By: Worthy Keeler PA-C Previous Signature: 03/19/2021 1:32:45 PM Version By: Donavan Burnet EMT Entered By: Levan Hurst on 03/19/2021 15:38:03 -------------------------------------------------------------------------------- HBO Safety Checklist Details Patient Name: Date of Service: [name] Hawthorne B. 03/19/2021 8:00 A M Medical Record Number: [id number] Patient Account Number: [id number] Date of Birth/Sex: Treating RN: 11/02/1950 (70 y.o. [name] Primary Care Analiese Krupka: [name] Other Clinician: Donavan Burnet Referring Alazae Crymes: Treating Hamlet Lasecki/Extender: Tally Joe in Treatment: 8 HBO Safety Checklist Items Safety Checklist Consent Form Signed Patient voided / foley secured and emptied When did you last eato 0700 Last dose of injectable or oral agent metformin after yesterday's HBO Tx Ostomy pouch emptied and vented if applicable NA All implantable devices assessed, documented and approved NA Intravenous access site secured and place NA Valuables secured Linens and cotton and cotton/polyester blend (less than 51% polyester) Personal oil-based products / skin lotions / body lotions removed Wigs or hairpieces removed Smoking or tobacco materials removed Books / newspapers / magazines / loose paper removed Cologne, aftershave, perfume and deodorant removed Jewelry removed (may wrap wedding band) Make-up removed NA Hair care products removed NA Battery operated devices (external) removed Heating patches and chemical warmers removed Titanium eyewear removed Nail polish cured greater than 10 hours NA Casting material cured greater than 10 hours NA Hearing aids removed NA Loose dentures or partials removed NA Prosthetics have been removed NA Patient demonstrates correct use of air break device (if applicable) Patient concerns have been addressed Patient  grounding bracelet on and cord attached to chamber Specifics for Inpatients (complete in addition to above) Medication sheet sent with patient NA Intravenous medications needed or due during therapy sent with patient NA Drainage tubes (e.g. nasogastric tube or chest tube secured and vented) NA Endotracheal or  Tracheotomy tube secured NA Cuff deflated of air and inflated with saline NA Airway suctioned NA Notes Paper version used at time of treatment start. Electronic Signature(s) Signed: 03/19/2021 5:49:01 PM By: Levan Hurst RN, BSN Previous Signature: 03/19/2021 1:32:45 PM Version By: Donavan Burnet EMT Entered By: Levan Hurst on 03/19/2021 15:38:17

## 2021-03-19 NOTE — Progress Notes (Addendum)
[name] [name] ([id number]) Visit Report for 03/19/2021 Arrival Information Details Patient Name: Date of Service: [name] [name], [name] [name] 03/19/2021 8:00 A M Medical Record Number: [id number] Patient Account Number: [id number] Date of Birth/Sex: Treating RN: 11/07/1950 (70 y.o. Ulyses Amor, Vaughan Basta Primary Care Lamontae Ricardo: [name] Other Clinician: Donavan Burnet Referring Shylynn Bruning: Treating Avelyn Touch/Extender: Tally Joe in Treatment: 8 Visit Information History Since Last Visit All ordered tests and consults were completed: Yes Patient Arrived: Ambulatory Added or deleted any medications: No Arrival Time: 07:30 Any new allergies or adverse reactions: No Accompanied By: self Had a fall or experienced change in No Transfer Assistance: None activities of daily living that may affect Patient Identification Verified: Yes risk of falls: Secondary Verification Process Completed: Yes Signs or symptoms of abuse/neglect since last visito No Patient Requires Transmission-Based Precautions: No Hospitalized since last visit: No Patient Has Alerts: Yes Implantable device outside of the clinic excluding No cellular tissue based products placed in the center since last visit: Pain Present Now: No Electronic Signature(s) Signed: 03/19/2021 5:49:01 PM By: Levan Hurst RN, BSN Previous Signature: 03/19/2021 1:32:45 PM Version By: Donavan Burnet EMT Entered By: Levan Hurst on 03/19/2021 15:40:53 -------------------------------------------------------------------------------- Encounter Discharge Information Details Patient Name: Date of Service: [name] Hawthorne B. 03/19/2021 8:00 A M Medical Record Number: [id number] Patient Account Number: [id number] Date of Birth/Sex: Treating RN: 05-11-1951 (70 y.o. [name] [name] Primary Care Senita Corredor: [name] Other Clinician: Donavan Burnet Referring Lenoir Facchini: Treating Yovan Leeman/Extender: Tally Joe in Treatment: 8 Encounter Discharge Information Items Discharge Condition: Stable Ambulatory Status: Ambulatory Discharge Destination: Other (Note Required) Transportation: Private Auto Accompanied By: self Schedule Follow-up Appointment: No Clinical Summary of Care: Notes Patient goes to work after treatment. Electronic Signature(s) Signed: 03/21/2021 12:35:13 PM By: Donavan Burnet EMT Entered By: Donavan Burnet on 03/21/2021 12:28:05 -------------------------------------------------------------------------------- Vitals Details Patient Name: Date of Service: [name] Hawthorne B. 03/19/2021 8:00 A M Medical Record Number: [id number] Patient Account Number: [id number] Date of Birth/Sex: Treating RN: [birth year] (70 y.o. [name] [name] Primary Care Joliana Claflin: [name] Other Clinician: Donavan Burnet Referring Shasta Chinn: Treating Gaile Allmon/Extender: Tally Joe in Treatment: 8 Vital Signs Time Taken: 07:57 Temperature (F): 98.4 Height (in): 73 Pulse (bpm): 53 Weight (lbs): 222 Respiratory Rate (breaths/min): 18 Body Mass Index (BMI): 29.3 Blood Pressure (mmHg): 125/56 Capillary Blood Glucose (mg/dl): 143 Reference Range: 80 - 120 mg / dl Electronic Signature(s) Signed: 03/19/2021 5:49:01 PM By: Levan Hurst RN, BSN Previous Signature: 03/19/2021 1:32:45 PM Version By: Donavan Burnet EMT Entered By: Levan Hurst on 03/19/2021 15:41:02

## 2021-03-19 NOTE — Progress Notes (Signed)
[name] [name], [name] [name] ([id number]) Visit Report for 03/18/2021 HBO Details Patient Name: Date of Service: JENE, GUTHERIE 03/18/2021 8:00 A M Medical Record Number: [id number] Patient Account Number: [id number] Date of Birth/Sex: Treating RN: 1951-01-30 (70 y.o. [name], Touchet Primary Care Ion Gonnella: [name] Other Clinician: Donavan Burnet Referring Stephone Gum: Treating Jerusalem Wert/Extender: Lanier Clam in Treatment: 7 HBO Treatment Course Details Treatment Course Number: 1 Ordering Felicity Penix: Linton Ham T Treatments Ordered: otal 40 HBO Treatment Start Date: 01/27/2021 HBO Indication: Diabetic Ulcer(s) of the Lower Extremity HBO Treatment Details Treatment Number: 31 Patient Type: Outpatient Chamber Type: Monoplace Chamber Serial #: [id number] Treatment Protocol: 2.5 ATA with 90 minutes oxygen, with two 5 minute air breaks Treatment Details Compression Rate Down: 2.5 psi / minute De-Compression Rate Up: 2.5 psi / minute A breaks and breathing ir Compress Tx Pressure periods Decompress Decompress Begins Reached (leave unused spaces Begins Ends blank) Chamber Pressure (ATA 1 2.5 2.5 2.5 2.5 2.5 - - 2.5 1 ) Clock Time (24 hr) 08:02 08:11 08:41 08:46 09:16 09:21 - - 09:51 10:00 Treatment Length: 118 (minutes) Treatment Segments: 4 Vital Signs Capillary Blood Glucose Reference Range: 80 - 120 mg / dl HBO Diabetic Blood Glucose Intervention Range: <131 mg/dl or >249 mg/dl Type: Time Vitals Blood Pulse: Respiratory Capillary Blood Glucose Pulse Action Temperature: Taken: Pressure: Rate: Glucose (mg/dl): Meter #: Oximetry (%) Taken: Pre 07:55 114/64 59 18 98 145 Post 10:05 122/64 52 18 97.7 111 discharge per protocol Treatment Response Treatment Toleration: Well Treatment Completion Status: Treatment Completed without Adverse Event Additional Procedure Documentation Tissue Sevierity: Limited to breakdown of skin Taia Bramlett Notes No  concerns with treatment given Physician HBO Attestation: I certify that I supervised this HBO treatment in accordance with Medicare guidelines. A trained emergency response team is readily available per Yes hospital policies and procedures. Continue HBOT as ordered. Yes Electronic Signature(s) Signed: 03/18/2021 5:13:37 PM By: Linton Ham MD Entered By: Linton Ham on 03/18/2021 17:11:57 -------------------------------------------------------------------------------- HBO Safety Checklist Details Patient Name: Date of Service: [name] Hawthorne B. 03/18/2021 8:00 A M Medical Record Number: [id number] Patient Account Number: [id number] Date of Birth/Sex: Treating RN: Jul 06, 1951 (70 y.o. [name], [name] Primary Care Katelyn Kohlmeyer: [name] Other Clinician: Donavan Burnet Referring Marabella Popiel: Treating Alyxis Grippi/Extender: Lanier Clam in Treatment: 7 HBO Safety Checklist Items Safety Checklist Consent Form Signed Patient voided / foley secured and emptied When did you last eato 0700 Last dose of injectable or oral agent Metformin yesterday Ostomy pouch emptied and vented if applicable NA All implantable devices assessed, documented and approved NA Intravenous access site secured and place NA Valuables secured Linens and cotton and cotton/polyester blend (less than 51% polyester) Personal oil-based products / skin lotions / body lotions removed Wigs or hairpieces removed NA Smoking or tobacco materials removed NA Books / newspapers / magazines / loose paper removed Cologne, aftershave, perfume and deodorant removed Jewelry removed (may wrap wedding band) Make-up removed NA Hair care products removed Battery operated devices (external) removed Heating patches and chemical warmers removed Titanium eyewear removed Nail polish cured greater than 10 hours NA Casting material cured greater than 10 hours NA Hearing aids removed NA Loose  dentures or partials removed NA Prosthetics have been removed NA Patient demonstrates correct use of air break device (if applicable) Patient concerns have been addressed Patient grounding bracelet on and cord attached to chamber Specifics for Inpatients (complete in addition to above) Medication sheet sent with patient NA Intravenous medications needed or  due during therapy sent with patient NA Drainage tubes (e.g. nasogastric tube or chest tube secured and vented) NA Endotracheal or Tracheotomy tube secured NA Cuff deflated of air and inflated with saline NA Airway suctioned NA Notes Paper version used today for data entry afterwards. Electronic Signature(s) Signed: 03/19/2021 1:32:45 PM By: Donavan Burnet EMT Entered By: Donavan Burnet on 03/18/2021 10:26:18

## 2021-03-19 NOTE — Progress Notes (Addendum)
ISMAR, PUZON ([name] [name]) Visit Report for 03/18/2021 Arrival Information Details Patient Name: Date of Service: [name] [name], [name] [name] 03/18/2021 8:00 A M Medical Record Number: [name] [name] Patient Account Number: [id number] Date of Birth/Sex: Treating RN: May 12, 1951 (70 y.o. [name] [name] Primary Care [name] [name]: [name] [name] Other Clinician: Donavan [name] Referring [name] [name]: Treating [name] [name]/Extender: [name] [name] in Treatment: 7 Visit Information History Since Last Visit All ordered tests and consults were completed: Yes Patient Arrived: Ambulatory Added or deleted any medications: No Arrival Time: 07:35 Any new allergies or adverse reactions: No Accompanied By: self Had a fall or experienced change in No Transfer Assistance: None activities of daily living that may affect Patient Identification Verified: Yes risk of falls: Secondary Verification Process Completed: Yes Signs or symptoms of abuse/neglect since last visito No Patient Requires Transmission-Based Precautions: No Hospitalized since last visit: No Patient Has Alerts: Yes Implantable device outside of the clinic excluding No cellular tissue based products placed in the center since last visit: Pain Present Now: No Electronic Signature(s) Signed: 03/19/2021 1:32:45 PM By: [name] [name] EMT Entered By: [name] [name] on 03/18/2021 08:27:34 -------------------------------------------------------------------------------- Encounter Discharge Information Details Patient Name: Date of Service: [name] Hawthorne B. 03/18/2021 8:00 A M Medical Record Number: [name] [name] Patient Account Number: [id number] Date of Birth/Sex: Treating RN: [birth year] (70 y.o. [name] [name] Primary Care Julann Mcgilvray: [name] [name] Other Clinician: Donavan [name] Referring Breniyah Romm: Treating Nalla Purdy/Extender: [name] [name] in Treatment: 7 Encounter Discharge Information  Items Discharge Condition: Stable Ambulatory Status: Ambulatory Discharge Destination: Other (Note Required) Transportation: Private Auto Accompanied By: self Schedule Follow-up Appointment: No Clinical Summary of Care: Notes Patient goes to work after treatment Electronic Signature(s) Signed: 03/21/2021 12:35:13 PM By: [name] [name] EMT Previous Signature: 03/19/2021 1:32:45 PM Version By: [name] [name] EMT Entered By: [name] [name] on 03/21/2021 12:27:27 -------------------------------------------------------------------------------- Vitals Details Patient Name: Date of Service: [name] Hawthorne B. 03/18/2021 8:00 A M Medical Record Number: [name] [name] Patient Account Number: [id number] Date of Birth/Sex: Treating RN: 03/02/1951 (70 y.o. [name] [name] Primary Care Sharley Keeler: [name] [name] Other Clinician: Donavan [name] Referring Hadia Minier: Treating Janelly Switalski/Extender: [name] [name] in Treatment: 7 Vital Signs Time Taken: 07:55 Temperature (F): 98 Height (in): 73 Pulse (bpm): 59 Weight (lbs): 222 Respiratory Rate (breaths/min): 18 Body Mass Index (BMI): 29.3 Blood Pressure (mmHg): 114/64 Capillary Blood Glucose (mg/dl): 145 Reference Range: 80 - 120 mg / dl Electronic Signature(s) Signed: 03/19/2021 1:32:45 PM By: [name] [name] EMT Entered By: [name] [name] on 03/18/2021 [id number]

## 2021-03-19 NOTE — Progress Notes (Signed)
RAGEN, MACARTHUR ([id number]) Visit Report for 03/18/2021 SuperBill Details Patient Name: Date of Service: [name] [name], [name] [name] 03/18/2021 Medical Record Number: [id number] Patient Account Number: [id number] Date of Birth/Sex: Treating RN: [birth year] (70 y.o. [name], [name] Primary Care Provider: [name] Other Clinician: Donavan Burnet Referring Provider: Treating Provider/Extender: Lanier Clam in Treatment: 7 Diagnosis Coding ICD-10 Codes Code Description [phone] Type 2 diabetes mellitus with foot ulcer L97.518 Non-pressure chronic ulcer of other part of right foot with other specified severity L97.511 Non-pressure chronic ulcer of other part of right foot limited to breakdown of skin M86.371 Chronic multifocal osteomyelitis, right ankle and foot E11.621 Type 2 diabetes mellitus with foot ulcer Facility Procedures CPT4 Code Description Modifier Quantity [id number]-(Facility Use Only) HBOT full body chamber, 105mn , 4 ICD-10 Diagnosis Description E11.621 Type 2 diabetes mellitus with foot ulcer L97.518 Non-pressure chronic ulcer of other part of right foot with other specified severity L97.511 Non-pressure chronic ulcer of other part of right foot limited to breakdown of skin M86.371 Chronic multifocal osteomyelitis, right ankle and foot Physician Procedures Quantity CPT4 Code Description Modifier [id number] PHYS HYPERBARIC OXYGEN THERAPY 1 ICD-10 Diagnosis Description E11.621 Type 2 diabetes mellitus with foot ulcer L97.518 Non-pressure chronic ulcer of other part of right foot with other specified severity L97.511 Non-pressure chronic ulcer of other part of right foot limited to breakdown of skin M86.371 Chronic multifocal osteomyelitis, right ankle and foot Electronic Signature(s) Signed: 03/18/2021 5:13:37 PM By: RLinton HamMD Signed: 03/19/2021 1:32:45 PM By: SDonavan BurnetEMT Entered By: SDonavan Burneton  03/18/2021 10:25:07

## 2021-03-20 ENCOUNTER — Encounter (HOSPITAL_BASED_OUTPATIENT_CLINIC_OR_DEPARTMENT_OTHER): Payer: Medicare HMO | Admitting: Internal Medicine

## 2021-03-20 ENCOUNTER — Other Ambulatory Visit: Payer: Self-pay

## 2021-03-20 DIAGNOSIS — L97518 Non-pressure chronic ulcer of other part of right foot with other specified severity: Secondary | ICD-10-CM | POA: Diagnosis not present

## 2021-03-20 DIAGNOSIS — L97511 Non-pressure chronic ulcer of other part of right foot limited to breakdown of skin: Secondary | ICD-10-CM | POA: Diagnosis not present

## 2021-03-20 DIAGNOSIS — E1151 Type 2 diabetes mellitus with diabetic peripheral angiopathy without gangrene: Secondary | ICD-10-CM | POA: Diagnosis not present

## 2021-03-20 DIAGNOSIS — E1122 Type 2 diabetes mellitus with diabetic chronic kidney disease: Secondary | ICD-10-CM | POA: Diagnosis not present

## 2021-03-20 DIAGNOSIS — G4733 Obstructive sleep apnea (adult) (pediatric): Secondary | ICD-10-CM | POA: Diagnosis not present

## 2021-03-20 DIAGNOSIS — M86371 Chronic multifocal osteomyelitis, right ankle and foot: Secondary | ICD-10-CM | POA: Diagnosis not present

## 2021-03-20 DIAGNOSIS — E1142 Type 2 diabetes mellitus with diabetic polyneuropathy: Secondary | ICD-10-CM | POA: Diagnosis not present

## 2021-03-20 DIAGNOSIS — I252 Old myocardial infarction: Secondary | ICD-10-CM | POA: Diagnosis not present

## 2021-03-20 DIAGNOSIS — E11621 Type 2 diabetes mellitus with foot ulcer: Secondary | ICD-10-CM | POA: Diagnosis not present

## 2021-03-20 DIAGNOSIS — I2510 Atherosclerotic heart disease of native coronary artery without angina pectoris: Secondary | ICD-10-CM | POA: Diagnosis not present

## 2021-03-20 LAB — GLUCOSE, CAPILLARY
Glucose-Capillary: 143 mg/dL — ABNORMAL HIGH (ref 70–99)
Glucose-Capillary: 119 mg/dL — ABNORMAL HIGH (ref 70–99)

## 2021-03-20 NOTE — Progress Notes (Signed)
AVYAY, NAKAYAMA ([id number]) Visit Report for 03/20/2021 HBO Details Patient Name: Date of Service: [name] [name], [name] [name] 03/20/2021 8:00 A M Medical Record Number: [id number] Patient Account Number: [id number] Date of Birth/Sex: Treating RN: 12/24/1950 (70 y.o. [name], [name] Primary Care Maxten Shuler: [name] Other Clinician: Donavan Burnet Referring Rowen Wilmer: Treating Rayon Mcchristian/Extender: Lanier Clam in Treatment: 8 HBO Treatment Course Details Treatment Course Number: 1 Ordering Ruqayyah Lute: Linton Ham T Treatments Ordered: otal 40 HBO Treatment Start Date: 01/27/2021 HBO Indication: Diabetic Ulcer(s) of the Lower Extremity HBO Treatment Details Treatment Number: 33 Patient Type: Outpatient Chamber Type: Monoplace Chamber Serial #: [id number] Treatment Protocol: 2.5 ATA with 90 minutes oxygen, with two 5 minute air breaks Treatment Details Compression Rate Down: 2.5 psi / minute De-Compression Rate Up: 2.5 psi / minute A breaks and breathing ir Compress Tx Pressure periods Decompress Decompress Begins Reached (leave unused spaces Begins Ends blank) Chamber Pressure (ATA 1 2.5 2.5 2.5 2.5 2.5 - - 2.5 1 ) Clock Time (24 hr) 08:24 08:34 09:04 09:09 09:39 09:44 - - 10:14 10:23 Treatment Length: 119 (minutes) Treatment Segments: 4 Vital Signs Capillary Blood Glucose Reference Range: 80 - 120 mg / dl HBO Diabetic Blood Glucose Intervention Range: <131 mg/dl or >249 mg/dl Type: Time Vitals Blood Pulse: Respiratory Capillary Blood Glucose Pulse Action Temperature: Taken: Pressure: Rate: Glucose (mg/dl): Meter #: Oximetry (%) Taken: Pre 08:16 131/59 52 18 97.8 143 Post 10:25 127/51 51 18 97.4 119 discharge per protocol Treatment Response Treatment Toleration: Well Treatment Completion Status: Treatment Completed without Adverse Event Additional Procedure Documentation Tissue Sevierity: Limited to breakdown of skin Samarra Ridgely Notes No concerns  with treatment given. Patient was also seen for wound care evaluation Physician HBO Attestation: I certify that I supervised this HBO treatment in accordance with Medicare guidelines. A trained emergency response team is readily available per Yes hospital policies and procedures. Continue HBOT as ordered. Yes Electronic Signature(s) Signed: 03/20/2021 12:53:09 PM By: Donavan Burnet EMT Signed: 03/20/2021 4:26:03 PM By: Linton Ham MD Entered By: Donavan Burnet on 03/20/2021 12:53:09 -------------------------------------------------------------------------------- HBO Safety Checklist Details Patient Name: Date of Service: [name] Hawthorne B. 03/20/2021 8:00 A M Medical Record Number: [id number] Patient Account Number: [id number] Date of Birth/Sex: Treating RN: 1951/02/17 (70 y.o. [name], [name] Primary Care Akeema Broder: [name] Other Clinician: Donavan Burnet Referring Ryota Treece: Treating Pastor Sgro/Extender: Lanier Clam in Treatment: 8 HBO Safety Checklist Items Safety Checklist Consent Form Signed Patient voided / foley secured and emptied When did you last eato 0700 Last dose of injectable or oral agent Yesterday after HBO Tx Ostomy pouch emptied and vented if applicable NA All implantable devices assessed, documented and approved NA Intravenous access site secured and place NA Valuables secured Linens and cotton and cotton/polyester blend (less than 51% polyester) Personal oil-based products / skin lotions / body lotions removed Wigs or hairpieces removed NA Smoking or tobacco materials removed NA Books / newspapers / magazines / loose paper removed Cologne, aftershave, perfume and deodorant removed Jewelry removed (may wrap wedding band) Make-up removed NA Hair care products removed NA Battery operated devices (external) removed Heating patches and chemical warmers removed Titanium eyewear removed Nail polish cured greater than  10 hours NA Casting material cured greater than 10 hours NA Hearing aids removed NA Loose dentures or partials removed NA Prosthetics have been removed NA Patient demonstrates correct use of air break device (if applicable) Patient concerns have been addressed Patient grounding bracelet on and cord attached to  chamber Specifics for Inpatients (complete in addition to above) Medication sheet sent with patient NA Intravenous medications needed or due during therapy sent with patient NA Drainage tubes (e.g. nasogastric tube or chest tube secured and vented) NA Endotracheal or Tracheotomy tube secured NA Cuff deflated of air and inflated with saline NA Airway suctioned NA Electronic Signature(s) Signed: 03/20/2021 12:54:38 PM By: Donavan Burnet EMT Entered By: Donavan Burnet on 03/20/2021 [id number]

## 2021-03-20 NOTE — Progress Notes (Addendum)
[name] [name], [name] [name] ([id number]) Visit Report for 03/20/2021 Arrival Information [name] Patient Name: Date of Service: [name] [name], [name] [name] 03/20/2021 7:30 A M Medical Record Number: [id number] Patient Account Number: [id number] Date of Birth/Sex: Treating RN: 12/25/1950 (70 y.o. [name] [name] Primary Care [name] [name]: [name] [name] Other Clinician: Referring [name] [name]: Treating [name] [name]/Extender: [name] [name] in Treatment: 8 Visit Information History Since Last Visit Added or deleted any medications: No Patient Arrived: Ambulatory Any new allergies or adverse reactions: No Arrival Time: 07:49 Had a fall or experienced change in No Accompanied By: self activities of daily living that may affect Transfer Assistance: None risk of falls: Patient Identification Verified: Yes Signs or symptoms of abuse/neglect since last visito No Secondary Verification Process Completed: Yes Hospitalized since last visit: No Patient Requires Transmission-Based Precautions: No Implantable device outside of the clinic excluding No Patient Has Alerts: Yes cellular tissue based products placed in the center since last visit: Has Dressing in Place as Prescribed: Yes Pain Present Now: No Electronic Signature(s) Signed: 03/20/2021 5:40:25 PM By: [name] Pilling RN, BSN Entered By: [name] [name] on 03/20/2021 07:49:30 -------------------------------------------------------------------------------- Clinic Level of Care Assessment [name] Patient Name: Date of Service: [name] [name], [name] [name] 03/20/2021 7:30 A M Medical Record Number: [id number] Patient Account Number: [id number] Date of Birth/Sex: Treating RN: [birth year] (69 y.o. [name] [name] Primary Care [name] [name]: [name] [name] Other Clinician: Referring [name] [name]: Treating [name] [name]/Extender: [name] [name] in Treatment: 8 Clinic Level of Care Assessment Items TOOL 4 Quantity Score X- 1 0 Use when only an EandM is  performed on FOLLOW-UP visit ASSESSMENTS - Nursing Assessment / Reassessment X- 1 10 Reassessment of Co-morbidities (includes updates in patient status) X- 1 5 Reassessment of Adherence to Treatment Plan ASSESSMENTS - Wound and Skin A ssessment / Reassessment X - Simple Wound Assessment / Reassessment - one wound 1 5 '[]'$  - 0 Complex Wound Assessment / Reassessment - multiple wounds X- 1 10 Dermatologic / Skin Assessment (not related to wound area) ASSESSMENTS - Focused Assessment X- 1 5 Circumferential Edema Measurements - multi extremities X- 1 10 Nutritional Assessment / Counseling / Intervention '[]'$  - 0 Lower Extremity Assessment (monofilament, tuning fork, pulses) '[]'$  - 0 Peripheral Arterial Disease Assessment (using hand held doppler) ASSESSMENTS - Ostomy and/or Continence Assessment and Care '[]'$  - 0 Incontinence Assessment and Management '[]'$  - 0 Ostomy Care Assessment and Management (repouching, etc.) PROCESS - Coordination of Care X - Simple Patient / Family Education for ongoing care 1 15 '[]'$  - 0 Complex (extensive) Patient / Family Education for ongoing care X- 1 10 Staff obtains Consents, Records, T Results / Process Orders est '[]'$  - 0 Staff telephones HHA, Nursing Homes / Clarify orders / etc '[]'$  - 0 Routine Transfer to another Facility (non-emergent condition) '[]'$  - 0 Routine Hospital Admission (non-emergent condition) '[]'$  - 0 New Admissions / Biomedical engineer / Ordering NPWT Apligraf, etc. , '[]'$  - 0 Emergency Hospital Admission (emergent condition) X- 1 10 Simple Discharge Coordination '[]'$  - 0 Complex (extensive) Discharge Coordination PROCESS - Special Needs '[]'$  - 0 Pediatric / Minor Patient Management '[]'$  - 0 Isolation Patient Management '[]'$  - 0 Hearing / Language / Visual special needs '[]'$  - 0 Assessment of Community assistance (transportation, D/C planning, etc.) '[]'$  - 0 Additional assistance / Altered mentation '[]'$  - 0 Support Surface(s) Assessment  (bed, cushion, seat, etc.) INTERVENTIONS - Wound Cleansing / Measurement X - Simple Wound Cleansing - one wound 1 5 '[]'$  - 0 Complex Wound Cleansing -  multiple wounds X- 1 5 Wound Imaging (photographs - any number of wounds) '[]'$  - 0 Wound Tracing (instead of photographs) X- 1 5 Simple Wound Measurement - one wound '[]'$  - 0 Complex Wound Measurement - multiple wounds INTERVENTIONS - Wound Dressings X - Small Wound Dressing one or multiple wounds 1 10 '[]'$  - 0 Medium Wound Dressing one or multiple wounds '[]'$  - 0 Large Wound Dressing one or multiple wounds '[]'$  - 0 Application of Medications - topical '[]'$  - 0 Application of Medications - injection INTERVENTIONS - Miscellaneous '[]'$  - 0 External ear exam '[]'$  - 0 Specimen Collection (cultures, biopsies, blood, body fluids, etc.) '[]'$  - 0 Specimen(s) / Culture(s) sent or taken to Lab for analysis '[]'$  - 0 Patient Transfer (multiple staff / Civil Service fast streamer / Similar devices) '[]'$  - 0 Simple Staple / Suture removal (25 or less) '[]'$  - 0 Complex Staple / Suture removal (26 or more) '[]'$  - 0 Hypo / Hyperglycemic Management (close monitor of Blood Glucose) '[]'$  - 0 Ankle / Brachial Index (ABI) - do not check if billed separately X- 1 5 Vital Signs Has the patient been seen at the hospital within the last three years: Yes Total Score: 110 Level Of Care: New/Established - Level 3 Electronic Signature(s) Signed: 03/20/2021 5:40:25 PM By: [name] Pilling RN, BSN Entered By: [name] [name] on 03/20/2021 07:56:44 -------------------------------------------------------------------------------- Encounter Discharge Information [name] Patient Name: Date of Service: [name] Hawthorne B. 03/20/2021 7:30 A M Medical Record Number: [id number] Patient Account Number: [id number] Date of Birth/Sex: Treating RN: 03-Apr-1951 (70 y.o. [name] [name] Primary Care [name] [name]: [name] [name] Other Clinician: Referring [name] [name]: Treating [name] [name]/Extender: [name] [name] in Treatment: 8 Encounter Discharge Information Items Discharge Condition: Stable Ambulatory Status: Ambulatory Discharge Destination: Other (Note Required) Telephoned: No Orders Sent: No Transportation: Private Auto Accompanied By: self Schedule Follow-up Appointment: Yes Clinical Summary of Care: Notes HBO after [place]. Electronic Signature(s) Signed: 03/20/2021 5:40:25 PM By: [name] Pilling RN, BSN Entered By: [name] [name] on 03/20/2021 08:05:38 -------------------------------------------------------------------------------- Lower Extremity Assessment [name] Patient Name: Date of Service: [name] [name], [name] Kanaris B. 03/20/2021 7:30 A M Medical Record Number: [id number] Patient Account Number: [id number] Date of Birth/Sex: Treating RN: 1951/05/14 (70 y.o. [name] [name] Primary Care Arwilda [place]: [name] [name] Other Clinician: Referring Justa Hatchell: Treating Vennesa Bastedo/Extender: [name] [name] in Treatment: 8 Edema Assessment Assessed: Shirlyn Goltz: No] Patrice Paradise: Yes] Edema: [Left: N] [Right: o] Calf Left: Right: Point of Measurement: From Medial Instep 36 cm Ankle Left: Right: Point of Measurement: From Medial Instep 25 cm Vascular Assessment Pulses: Dorsalis Pedis Palpable: [Right:Yes] Electronic Signature(s) Signed: 03/20/2021 5:40:25 PM By: [name] Pilling RN, BSN Entered By: [name] [name] on 03/20/2021 07:52:03 -------------------------------------------------------------------------------- Multi Wound Chart [name] Patient Name: Date of Service: [name] Hawthorne B. 03/20/2021 7:30 A M Medical Record Number: [id number] Patient Account Number: [id number] Date of Birth/Sex: Treating RN: 1950/11/26 (70 y.o. [name] [name] Primary Care Raegyn Renda: [name] [name] Other Clinician: Referring Makailyn Mccormick: Treating Deanthony Maull/Extender: [name] [name] in Treatment: 8 Vital Signs Height(in): 73 Pulse(bpm): 18 Weight(lbs): 222 Blood  Pressure(mmHg): 133/67 Body Mass Index(BMI): 29 Temperature(F): 97.9 Respiratory Rate(breaths/min): 20 Photos: [3:No Photos Right, Plantar T Great oe] [N/A:N/A N/A] Wound Location: [3:Gradually Appeared] [N/A:N/A] Wounding Event: [3:Diabetic Wound/Ulcer of the Lower] [N/A:N/A] Primary Etiology: [3:Extremity Hypertension, Type II Diabetes, Gout,] [N/A:N/A] Comorbid History: [3:Osteomyelitis, Neuropathy 12/11/2020] [N/A:N/A] Date Acquired: [3:8] [N/A:N/A] Weeks of Treatment: [3:Open] [N/A:N/A] Wound Status: [3:0x0x0] [N/A:N/A] Measurements L x W x D (cm) [3:0] [N/A:N/A] A (cm) :  rea [3:0] [N/A:N/A] Volume (cm) : [3:100.00%] [N/A:N/A] % Reduction in A rea: [3:100.00%] [N/A:N/A] % Reduction in Volume: [3:Grade 3] [N/A:N/A] Classification: [3:None Present] [N/A:N/A] Exudate A mount: [3:Distinct, outline attached] [N/A:N/A] Wound Margin: [3:None Present (0%)] [N/A:N/A] Granulation A mount: [3:None Present (0%)] [N/A:N/A] Necrotic A mount: [3:Fascia: No] [N/A:N/A] Exposed Structures: [3:Fat Layer (Subcutaneous Tissue): No Tendon: No Muscle: No Joint: No Bone: No Large (67-100%)] [N/A:N/A] Treatment Notes Wound #3 (Toe Great) Wound Laterality: Plantar, Right Cleanser Soap and Water Discharge Instruction: May shower and wash wound with dial antibacterial soap and water prior to dressing change. Peri-Wound Care Topical Primary Dressing Secondary Dressing Optifoam Non-Adhesive Dressing, 4x4 in Discharge Instruction: cut to make foam donut to offload Secured With Conforming Stretch Gauze Bandage, Sterile 2x75 (in/in) Discharge Instruction: Secure with stretch gauze as directed. Compression Wrap Compression Stockings Add-Ons Electronic Signature(s) Signed: 03/20/2021 4:26:03 PM By: Linton Ham MD Signed: 03/20/2021 5:40:25 PM By: [name] Pilling RN, BSN Entered By: Linton Ham on 03/20/2021  08:51:55 -------------------------------------------------------------------------------- Multi-Disciplinary Care Plan [name] Patient Name: Date of Service: [name] [name], [name] [name] 03/20/2021 7:30 A M Medical Record Number: [id number] Patient Account Number: [id number] Date of Birth/Sex: Treating RN: July 04, 1951 (70 y.o. [name] [name] Primary Care Porsha Skilton: [name] [name] Other Clinician: Referring Deklen Popelka: Treating Burnard Enis/Extender: [name] [name] in Treatment: 8 Multidisciplinary Care Plan reviewed with physician Active Inactive Electronic Signature(s) Signed: 05/08/2021 5:36:07 PM By: [name] Pilling RN, BSN Previous Signature: 03/20/2021 5:40:25 PM Version By: [name] Pilling RN, BSN Entered By: [name] [name] on 05/08/2021 17:36:06 -------------------------------------------------------------------------------- Pain Assessment [name] Patient Name: Date of Service: [name] Hawthorne B. 03/20/2021 7:30 A M Medical Record Number: [id number] Patient Account Number: [id number] Date of Birth/Sex: Treating RN: 1951/01/16 (70 y.o. [name] [name] Primary Care Jadin Kagel: [name] [name] Other Clinician: Referring Cadi Rhinehart: Treating Lawan Nanez/Extender: [name] [name] in Treatment: 8 Active Problems Location of Pain Severity and Description of Pain Patient Has Paino No Site Locations Rate the pain. Rate the pain. Current Pain Level: 0 Pain Management and Medication Current Pain Management: Medication: No Cold Application: No Rest: No Massage: No Activity: No T.E.N.S.: No Heat Application: No Leg drop or elevation: No Is the Current Pain Management Adequate: Adequate How does your wound impact your activities of daily livingo Sleep: No Bathing: No Appetite: No Relationship With Others: No Bladder Continence: No Emotions: No Bowel Continence: No Work: No Toileting: No Drive: No Dressing: No Hobbies: No Metallurgist) Signed: 03/20/2021 5:40:25 PM By: [name] Pilling RN, BSN Entered By: [name] [name] on 03/20/2021 07:50:52 -------------------------------------------------------------------------------- Patient/Caregiver Education [name] Patient Name: Date of Service: [name] [name] 9/8/2022andnbsp7:30 A M Medical Record Number: [id number] Patient Account Number: [id number] Date of Birth/Gender: Treating RN: 04-18-1951 (70 y.o. [name] [name] Primary Care Physician: [name] [name] Other Clinician: Referring Physician: Treating Physician/Extender: [name] [name] in Treatment: 8 Education Assessment Education Provided To: Patient Education Topics Provided Wound/Skin Impairment: Handouts: Skin Care Do's and Dont's Methods: Explain/Verbal Responses: Reinforcements needed Electronic Signature(s) Signed: 03/20/2021 5:40:25 PM By: [name] Pilling RN, BSN Entered By: [name] [name] on 03/20/2021 07:55:44 -------------------------------------------------------------------------------- Wound Assessment [name] Patient Name: Date of Service: [name] Hawthorne B. 03/20/2021 7:30 A M Medical Record Number: [id number] Patient Account Number: [id number] Date of Birth/Sex: Treating RN: January 17, 1951 (70 y.o. [name] [name] Primary Care Pressley Barsky: [name] [name] Other Clinician: Referring Vilma Will: Treating Antia Rahal/Extender: [name] [name] in Treatment: 8 Wound Status Wound Number: 3 Primary Diabetic Wound/Ulcer of the Lower Extremity Etiology: Wound  Location: Right, Plantar T Great oe Wound Status: Open Wounding Event: Gradually Appeared Comorbid Hypertension, Type II Diabetes, Gout, Osteomyelitis, Date Acquired: 12/11/2020 History: Neuropathy Weeks Of Treatment: 8 Clustered Wound: No Wound Measurements Length: (cm) Width: (cm) Depth: (cm) Area: (cm) Volume: (cm) 0 % Reduction in Area: 100% 0 % Reduction in Volume: 100% 0  Epithelialization: Large (67-100%) 0 Tunneling: No 0 Undermining: No Wound Description Classification: Grade 3 Wound Margin: Distinct, outline attached Exudate Amount: None Present Wound Bed Granulation Amount: None Present (0%) Exposed Structure Necrotic Amount: None Present (0%) Fascia Exposed: No Fat Layer (Subcutaneous Tissue) Exposed: No Tendon Exposed: No Muscle Exposed: No Joint Exposed: No Bone Exposed: No Electronic Signature(s) Signed: 03/20/2021 5:40:25 PM By: [name] Pilling RN, BSN Entered By: [name] [name] on 03/20/2021 07:52:57 -------------------------------------------------------------------------------- [name] [name] Patient Name: Date of Service: [name] Hawthorne B. 03/20/2021 7:30 A M Medical Record Number: [id number] Patient Account Number: [id number] Date of Birth/Sex: Treating RN: 1950-09-23 (70 y.o. [name] [name] Primary Care Zeeva Courser: [name] [name] Other Clinician: Referring Raynelle Fujikawa: Treating Chijioke Lasser/Extender: [name] [name] in Treatment: 8 Vital Signs Time Taken: 07:50 Temperature (F): 97.9 Height (in): 73 Pulse (bpm): 58 Weight (lbs): 222 Respiratory Rate (breaths/min): 20 Body Mass Index (BMI): 29.3 Blood Pressure (mmHg): 133/67 Reference Range: 80 - 120 mg / dl Electronic Signature(s) Signed: 03/20/2021 5:40:25 PM By: [name] Pilling RN, BSN Entered By: [name] [name] on 03/20/2021 07:50:25

## 2021-03-20 NOTE — Progress Notes (Addendum)
DARROL, BABIN ([id number]) Visit Report for 03/20/2021 Arrival Information Details Patient Name: Date of Service: KONA, BARTOK 03/20/2021 8:00 A M Medical Record Number: [id number] Patient Account Number: [id number] Date of Birth/Sex: Treating RN: Apr 05, 1951 (70 y.o. [name], [name] Primary Care Neil Errickson: [name] Other Clinician: Donavan Burnet Referring Danaysha Kirn: Treating Raquelle Pietro/Extender: Lanier Clam in Treatment: 8 Visit Information History Since Last Visit All ordered tests and consults were completed: Yes Patient Arrived: Ambulatory Added or deleted any medications: No Arrival Time: 08:00 Any new allergies or adverse reactions: No Accompanied By: self Had a fall or experienced change in No Transfer Assistance: None activities of daily living that may affect Patient Identification Verified: Yes risk of falls: Secondary Verification Process Completed: Yes Signs or symptoms of abuse/neglect since last visito No Patient Requires Transmission-Based Precautions: No Hospitalized since last visit: No Patient Has Alerts: Yes Implantable device outside of the clinic excluding No cellular tissue based products placed in the center since last visit: Pain Present Now: No Electronic Signature(s) Signed: 03/20/2021 12:54:38 PM By: Donavan Burnet EMT Entered By: Donavan Burnet on 03/20/2021 08:16:30 -------------------------------------------------------------------------------- Encounter Discharge Information Details Patient Name: Date of Service: [name] 03/20/2021 8:00 A M Medical Record Number: [id number] Patient Account Number: [id number] Date of Birth/Sex: Treating RN: Sep 22, 1950 (70 y.o. Hessie Diener Primary Care Jaila Schellhorn: [name] Other Clinician: Donavan Burnet Referring Harley Fitzwater: Treating Hattie Pine/Extender: Lanier Clam in Treatment: 8 Encounter Discharge Information  Items Discharge Condition: Stable Ambulatory Status: Ambulatory Discharge Destination: Other (Note Required) Transportation: Private Auto Accompanied By: self Schedule Follow-up Appointment: No Clinical Summary of Care: Notes Patient goes to work after treatment Electronic Signature(s) Signed: 03/21/2021 12:35:13 PM By: Donavan Burnet EMT Previous Signature: 03/20/2021 12:54:38 PM Version By: Donavan Burnet EMT Entered By: Donavan Burnet on 03/21/2021 12:28:49 -------------------------------------------------------------------------------- [place] Details Patient Name: Date of Service: [name] 03/20/2021 8:00 A M Medical Record Number: [id number] Patient Account Number: [id number] Date of Birth/Sex: Treating RN: March 13, 1951 (70 y.o. [name], [name] Primary Care Merrianne Mccumbers: [name] Other Clinician: Donavan Burnet Referring Cedar Roseman: Treating Gerardo Caiazzo/Extender: Lanier Clam in Treatment: 8 Vital Signs Time Taken: 08:16 Temperature (F): 97.8 Height (in): 73 Pulse (bpm): 52 Weight (lbs): 222 Respiratory Rate (breaths/min): 18 Body Mass Index (BMI): 29.3 Blood Pressure (mmHg): 131/59 Capillary Blood Glucose (mg/dl): 143 Reference Range: 80 - 120 mg / dl Electronic Signature(s) Signed: 03/20/2021 12:54:38 PM By: Donavan Burnet EMT Entered By: Donavan Burnet on 03/20/2021 08:17:58

## 2021-03-20 NOTE — Progress Notes (Signed)
DELMON, KONDO ([id number]) Visit Report for 03/19/2021 SuperBill Details Patient Name: Date of Service: [name] [name], [name] [name] 03/19/2021 Medical Record Number: [id number] Patient Account Number: [id number] Date of Birth/Sex: Treating RN: July 06, 1951 (70 y.o. [name] Primary Care Provider: [name] Other Clinician: Donavan Burnet Referring Provider: Treating Provider/Extender: Tally Joe in Treatment: 8 Diagnosis Coding ICD-10 Codes Code Description [phone] Type 2 diabetes mellitus with foot ulcer L97.518 Non-pressure chronic ulcer of other part of right foot with other specified severity L97.511 Non-pressure chronic ulcer of other part of right foot limited to breakdown of skin M86.371 Chronic multifocal osteomyelitis, right ankle and foot E11.621 Type 2 diabetes mellitus with foot ulcer Facility Procedures CPT4 Code Description Modifier Quantity [id number]-(Facility Use Only) HBOT full body chamber, 57mn , 4 ICD-10 Diagnosis Description E11.621 Type 2 diabetes mellitus with foot ulcer L97.518 Non-pressure chronic ulcer of other part of right foot with other specified severity L97.511 Non-pressure chronic ulcer of other part of right foot limited to breakdown of skin M86.371 Chronic multifocal osteomyelitis, right ankle and foot Physician Procedures Quantity CPT4 Code Description Modifier [id number] PHYS HYPERBARIC OXYGEN THERAPY 1 ICD-10 Diagnosis Description E11.621 Type 2 diabetes mellitus with foot ulcer L97.518 Non-pressure chronic ulcer of other part of right foot with other specified severity L97.511 Non-pressure chronic ulcer of other part of right foot limited to breakdown of skin M86.371 Chronic multifocal osteomyelitis, right ankle and foot Electronic Signature(s) Signed: 03/19/2021 6:00:14 PM By: SWorthy KeelerPA-C Signed: 03/20/2021 4:26:03 PM By: RLinton HamMD Previous Signature: 03/19/2021 1:32:45 PM  Version By: SDonavan BurnetEMT Entered By: RLinton Hamon 03/19/2021 15:40:05

## 2021-03-20 NOTE — Progress Notes (Signed)
[name] [name], [name] [name] ([id number]) Visit Report for 03/20/2021 HPI Details Patient Name: Date of Service: ROMEN, [name] 03/20/2021 7:30 A M Medical Record Number: [id number] Patient Account Number: [id number] Date of Birth/Sex: Treating RN: April 21, 1951 (70 y.o. [name] [name] Primary Care Provider: [name] Other Clinician: Referring Provider: Treating Provider/Extender: Lanier Clam in Treatment: 8 History of Present Illness HPI Description: ADMISSION 01/22/2021 This is a 70 year old man with type 2 diabetes and diabetic neuropathy on metformin. He has been referred to our clinic from the wound care center in [facility] by Dr. Adelina Mings for consideration of hyperbaric oxygen therapy. Looking through [facility] health link the patient has been followed by Dr. Rocco Serene since the fall of last year at which time he had a Wagner grade 2 wound on the plantar aspect of the right great toe. This deteriorated according to the patient in June when the toe began rapidly more swollen. Ultimately he required admission to hospital from 01/01/2021 through 01/07/2021 at which time he had streaking erythema up his foot. He had an MRI done on 12/26/2020 which showed medial plantar ulceration of the great toe to bone with diffuse soft tissue swelling of the great toe with enhancement. No fluid collection or hematoma there was osteomyelitis of the first proximal and distal phalanx no abscess. He also had small erosions of the first metatarsal head and first TMT joint suspicious for gout. Cultures grew group B strep and Prevotella. He was treated with IV Invanz and p.o. ciprofloxacin. In fact he was just stopped of the ciprofloxacin by infectious disease today although he remains on IV Invanz until the beginning of August. He is tolerating this well. There is been marked improvement in his wounds and he was referred for consideration of additional hyperbaric  oxygen therapy. 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 Surgically removed in 2018 ABIs were done on 05/06/2020 which showed noncompressible waveforms ABIs were biphasic at the ankles bilaterally. TBI's were not done 01/29/2021; patient is here for follow-up from his admission last week. In the interim he started HBO yesterday which he tolerated well. He is still on his IV antibiotics [Invanz]. We have been using endoform. He has wounds on the right great toe x3 and an abrasion injury on the dorsal right third toe 8/18; he has completed 18 hyperbaric treatments. This is out of 40. He has completed his Invanz as of about 2 weeks ago PICC line was removed. The only wound remaining on his right first toe was on the plantar aspect. The lateral and and dorsal wounds are closed 8/26; patient has completed 24 treatments. Wound at 0.1 x 0.1 x 0.1. Only remaining area on the plantar aspect of the right great toe. Much improved 9/1; the patient's involved right great toe wound essentially almost closed. I given him permission to graduate back into his usual footwear. He will have to continue to pad this area both with an insole in his shoe and perhaps an adherent foam dressing on the plantar toe on the right. 9/8; patient seen in conjunction with HBO. He has completed his antibiotics for underlying osteomyelitis of the right great toe. Wound areas are closed swelling and erythema in the toe are resolved. He is completing his HBO which will be up next week on Friday. I  again went over with him the possibility of infection recurrence up to perhaps 10 to 12 months from now. He understands this. Electronic Signature(s) Signed: 03/20/2021 4:26:03 PM By: Linton Ham MD Entered By:  Linton Ham on 03/20/2021 08:53:14 -------------------------------------------------------------------------------- Physical Exam Details Patient Name: Date of Service: [name] Hawthorne B. 03/20/2021 7:30 A M Medical Record Number: [id number] Patient Account Number: [id number] Date of Birth/Sex: Treating RN: 1950-12-01 (70 y.o. [name] [name] Primary Care Provider: [name] Other Clinician: Referring Provider: Treating Provider/Extender: Lanier Clam in Treatment: 8 Constitutional Sitting or standing Blood Pressure is within target range for patient.. Pulse regular and within target range for patient.Marland Kitchen Respirations regular, non-labored and within target range.. Temperature is normal and within the target range for the patient.Marland Kitchen Appears in no distress. Notes Wound exam; there is nothing open on the left great toe. Some dry eschar over one of the sinus tracts on the plantar aspect however this does not require debridement. There is no erythema no purulence everything looks stable Electronic Signature(s) Signed: 03/20/2021 4:26:03 PM By: Linton Ham MD Entered By: Linton Ham on 03/20/2021 08:54:57 -------------------------------------------------------------------------------- Physician Orders Details Patient Name: Date of Service: [name] Hawthorne B. 03/20/2021 7:30 A M Medical Record Number: [id number] Patient Account Number: [id number] Date of Birth/Sex: Treating RN: [birth year] (70 y.o. [name], [name] Primary Care Provider: [name] Other Clinician: Referring Provider: Treating Provider/Extender: Lanier Clam in Treatment: 8 Verbal / Phone Orders: No Diagnosis Coding ICD-10 Coding Code Description E11.621 Type 2 diabetes mellitus with foot ulcer L97.518 Non-pressure chronic ulcer of other part of right foot with other specified severity L97.511 Non-pressure chronic ulcer of other part of right foot limited to  breakdown of skin M86.371 Chronic multifocal osteomyelitis, right ankle and foot E11.621 Type 2 diabetes mellitus with foot ulcer Follow-up Appointments Other: - Complete Hyberbarics last day Friday 03/28/2021. pad right great toe several weeks. monitor closely. purchase insoles for shoes. Electronic Signature(s) Signed: 03/20/2021 4:26:03 PM By: Linton Ham MD Signed: 03/20/2021 5:40:25 PM By: Deon Pilling RN, BSN Entered By: Deon Pilling on 03/20/2021 08:09:01 -------------------------------------------------------------------------------- Problem List Details Patient Name: Date of Service: [name] Hawthorne B. 03/20/2021 7:30 A M Medical Record Number: [id number] Patient Account Number: [id number] Date of Birth/Sex: Treating RN: April 13, 1951 (70 y.o. [name] [name] Primary Care Provider: [name] Other Clinician: Referring Provider: Treating Provider/Extender: Lanier Clam in Treatment: 8 Active Problems ICD-10 Encounter Code Description Active Date MDM Diagnosis E11.621 Type 2 diabetes mellitus with foot ulcer 01/22/2021 No Yes L97.518 Non-pressure chronic ulcer of other part of right foot with other specified 01/22/2021 No Yes severity L97.511 Non-pressure chronic ulcer of other part of right foot limited to breakdown of 01/22/2021 No Yes skin M86.371 Chronic multifocal osteomyelitis, right ankle and foot 01/22/2021 No Yes E11.621 Type 2 diabetes mellitus with foot ulcer 01/22/2021 No Yes Inactive Problems Resolved Problems Electronic Signature(s) Signed: 03/20/2021 4:26:03 PM By: Linton Ham MD Entered By: Linton Ham on 03/20/2021 08:51:48 -------------------------------------------------------------------------------- Progress Note Details Patient Name: Date of Service: [name] Hawthorne B. 03/20/2021 7:30 A M Medical Record Number: [id number] Patient Account Number: [id number] Date of Birth/Sex: Treating RN: 24-May-1951 (70 y.o. [name] [name] Primary Care Provider: [name] Other Clinician: Referring Provider: Treating Provider/Extender: Lanier Clam in Treatment: 8 Subjective History of Present Illness (HPI) ADMISSION 01/22/2021 This is a 70 year old man with type 2 diabetes and diabetic neuropathy on metformin. He has been referred to our  clinic from the wound care center in [facility] by Dr. Adelina Mings for consideration of hyperbaric oxygen therapy. Looking through [facility] health link the patient has been followed by Dr. Rocco Serene since the fall of last year at which time he had a Wagner grade 2 wound on the plantar aspect of the right great toe. This deteriorated according to the patient in June when the toe began rapidly more swollen. Ultimately he required admission to hospital from 01/01/2021 through 01/07/2021 at which time he had streaking erythema up his foot. He had an MRI done on 12/26/2020 which showed medial plantar ulceration of the great toe to bone with diffuse soft tissue swelling of the great toe with enhancement. No fluid collection or hematoma there was osteomyelitis of the first proximal and distal phalanx no abscess. He also had small erosions of the first metatarsal head and first TMT joint suspicious for gout. Cultures grew group B strep and Prevotella. He was treated with IV Invanz and p.o. ciprofloxacin. In fact he was just stopped of the ciprofloxacin by infectious disease today although he remains on IV Invanz until the beginning of August. He is tolerating this well. There is been marked improvement in his wounds and he was referred for consideration of additional hyperbaric oxygen therapy. 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  Surgically removed in 2018 ABIs were done on 05/06/2020 which showed noncompressible waveforms ABIs were biphasic at the ankles bilaterally. TBI's were not done 01/29/2021; patient is here for follow-up from his admission last week. In the interim he started HBO yesterday which he tolerated well. He is still on his IV antibiotics [Invanz]. We have been using endoform. He has wounds on the right great toe x3 and an abrasion injury on the dorsal right third toe 8/18; he has completed 18 hyperbaric treatments. This is out of 40. He has completed his Invanz as of about 2 weeks ago PICC line was removed. The only wound remaining on his right first toe was on the plantar aspect. The lateral and and dorsal wounds are closed 8/26; patient has completed 24 treatments. Wound at 0.1 x 0.1 x 0.1. Only remaining area on the plantar aspect of the right great toe. Much improved 9/1; the patient's involved right great toe wound essentially almost closed. I given him permission to graduate back into his usual footwear. He will have to continue to pad this area both with an insole in his shoe and perhaps an adherent foam dressing on the plantar toe on the right. 9/8; patient seen in conjunction with HBO. He has completed his antibiotics for underlying osteomyelitis of the right great toe. Wound areas are closed swelling and erythema in the toe are resolved. He is completing his HBO which will be up next week on Friday. I again went over with him the possibility of infection recurrence up to perhaps 10 to 12 months from now. He understands this. Objective Constitutional Sitting or standing Blood Pressure is within target range for patient.. Pulse regular and within target range for patient.Marland Kitchen Respirations regular, non-labored and within target range.. Temperature is normal and within the target range for the patient.Marland Kitchen Appears in no distress. Vitals Time Taken: 7:50 AM, Height: 73 in, Weight: 222 lbs, BMI: 29.3,  Temperature: 97.9 F, Pulse: 58 bpm, Respiratory Rate: 20 breaths/min,  Blood Pressure: 133/67 mmHg. General Notes: Wound exam; there is nothing open on the left great toe. Some dry eschar over one of the sinus tracts on the plantar aspect however this does not require debridement. There is no erythema no purulence everything looks stable Integumentary (Hair, Skin) Wound #3 status is Open. Original cause of wound was Gradually Appeared. The date acquired was: 12/11/2020. The wound has been in treatment 8 weeks. The wound is located on the [employer]. The wound measures 0cm length x 0cm width x 0cm depth; 0cm^2 area and 0cm^3 volume. There is no oe tunneling or undermining noted. There is a none present amount of drainage noted. The wound margin is distinct with the outline attached to the wound base. There is no granulation within the wound bed. There is no necrotic tissue within the wound bed. Assessment Active Problems ICD-10 Type 2 diabetes mellitus with foot ulcer Non-pressure chronic ulcer of other part of right foot with other specified severity Non-pressure chronic ulcer of other part of right foot limited to breakdown of skin Chronic multifocal osteomyelitis, right ankle and foot Type 2 diabetes mellitus with foot ulcer Plan Follow-up Appointments: Other: - Complete Hyberbarics last day Friday 03/28/2021. pad right great toe several weeks. monitor closely. purchase insoles for shoes. 1. The patient will complete hyperbarics on 03/28/2021. As far as I can tell the underlying osteomyelitis has been successfully treated with hyperbarics in combination with antibiotic therapy that was prescribed by infectious disease. 2. I have asked him to continue to offload this area Electronic Signature(s) Signed: 03/20/2021 4:26:03 PM By: Linton Ham MD Entered By: Linton Ham on 03/20/2021  08:56:37 -------------------------------------------------------------------------------- SuperBill Details Patient Name: Date of Service: [name] 03/20/2021 Medical Record Number: [id number] Patient Account Number: [id number] Date of Birth/Sex: Treating RN: 10-30-1950 (70 y.o. [name], [name] Primary Care Provider: [name] Other Clinician: Referring Provider: Treating Provider/Extender: Lanier Clam in Treatment: 8 Diagnosis Coding ICD-10 Codes Code Description [phone] Type 2 diabetes mellitus with foot ulcer L97.518 Non-pressure chronic ulcer of other part of right foot with other specified severity L97.511 Non-pressure chronic ulcer of other part of right foot limited to breakdown of skin M86.371 Chronic multifocal osteomyelitis, right ankle and foot E11.621 Type 2 diabetes mellitus with foot ulcer Facility Procedures CPT4 Code: [id number] Description: [id number] - WOUND CARE VISIT-LEV 3 EST PT Modifier: Quantity: 1 Electronic Signature(s) Signed: 03/20/2021 4:26:03 PM By: Linton Ham MD Entered By: Linton Ham on 03/20/2021 08:56:54

## 2021-03-20 NOTE — Progress Notes (Signed)
JAYSE, OBST ([id number]) Visit Report for 03/20/2021 SuperBill Details Patient Name: Date of Service: [name] [name], [name] [name] 03/20/2021 Medical Record Number: [id number] Patient Account Number: [id number] Date of Birth/Sex: Treating RN: February 13, 1951 (70 y.o. [name], [name] Primary Care Provider: [name] Other Clinician: Donavan Burnet Referring Provider: Treating Provider/Extender: Lanier Clam in Treatment: 8 Diagnosis Coding ICD-10 Codes Code Description [phone] Type 2 diabetes mellitus with foot ulcer L97.518 Non-pressure chronic ulcer of other part of right foot with other specified severity L97.511 Non-pressure chronic ulcer of other part of right foot limited to breakdown of skin M86.371 Chronic multifocal osteomyelitis, right ankle and foot E11.621 Type 2 diabetes mellitus with foot ulcer Facility Procedures CPT4 Code Description Modifier Quantity [id number] G0277-(Facility Use Only) HBOT full body chamber, 92mn , 4 ICD-10 Diagnosis Description E11.621 Type 2 diabetes mellitus with foot ulcer L97.518 Non-pressure chronic ulcer of other part of right foot with other specified severity L97.511 Non-pressure chronic ulcer of other part of right foot limited to breakdown of skin M86.371 Chronic multifocal osteomyelitis, right ankle and foot Physician Procedures Quantity CPT4 Code Description Modifier [id number] PHYS HYPERBARIC OXYGEN THERAPY 1 ICD-10 Diagnosis Description E11.621 Type 2 diabetes mellitus with foot ulcer L97.518 Non-pressure chronic ulcer of other part of right foot with other specified severity L97.511 Non-pressure chronic ulcer of other part of right foot limited to breakdown of skin M86.371 Chronic multifocal osteomyelitis, right ankle and foot Electronic Signature(s) Signed: 03/20/2021 12:54:38 PM By: SDonavan BurnetEMT Signed: 03/20/2021 4:26:03 PM By: RLinton HamMD Entered By: SDonavan Burneton 03/20/2021  11:09:09

## 2021-03-21 ENCOUNTER — Encounter (HOSPITAL_BASED_OUTPATIENT_CLINIC_OR_DEPARTMENT_OTHER): Payer: Medicare HMO | Admitting: Internal Medicine

## 2021-03-21 DIAGNOSIS — M86371 Chronic multifocal osteomyelitis, right ankle and foot: Secondary | ICD-10-CM

## 2021-03-21 DIAGNOSIS — E1142 Type 2 diabetes mellitus with diabetic polyneuropathy: Secondary | ICD-10-CM | POA: Diagnosis not present

## 2021-03-21 DIAGNOSIS — I252 Old myocardial infarction: Secondary | ICD-10-CM | POA: Diagnosis not present

## 2021-03-21 DIAGNOSIS — I2510 Atherosclerotic heart disease of native coronary artery without angina pectoris: Secondary | ICD-10-CM | POA: Diagnosis not present

## 2021-03-21 DIAGNOSIS — E11621 Type 2 diabetes mellitus with foot ulcer: Secondary | ICD-10-CM

## 2021-03-21 DIAGNOSIS — L97511 Non-pressure chronic ulcer of other part of right foot limited to breakdown of skin: Secondary | ICD-10-CM

## 2021-03-21 DIAGNOSIS — G4733 Obstructive sleep apnea (adult) (pediatric): Secondary | ICD-10-CM | POA: Diagnosis not present

## 2021-03-21 DIAGNOSIS — E1122 Type 2 diabetes mellitus with diabetic chronic kidney disease: Secondary | ICD-10-CM | POA: Diagnosis not present

## 2021-03-21 DIAGNOSIS — E1151 Type 2 diabetes mellitus with diabetic peripheral angiopathy without gangrene: Secondary | ICD-10-CM | POA: Diagnosis not present

## 2021-03-21 DIAGNOSIS — L97518 Non-pressure chronic ulcer of other part of right foot with other specified severity: Secondary | ICD-10-CM

## 2021-03-21 LAB — GLUCOSE, CAPILLARY
Glucose-Capillary: 127 mg/dL — ABNORMAL HIGH (ref 70–99)
Glucose-Capillary: 138 mg/dL — ABNORMAL HIGH (ref 70–99)
Glucose-Capillary: 101 mg/dL — ABNORMAL HIGH (ref 70–99)

## 2021-03-21 NOTE — Progress Notes (Addendum)
KOHLER, AGERTON ([id number]) Visit Report for 03/21/2021 Arrival Information Details Patient Name: Date of Service: [name] [name], [name] [name] 03/21/2021 8:00 A M Medical Record Number: [id number] Patient Account Number: [id number] Date of Birth/Sex: Treating RN: [birth year] (70 y.o. [name] [name], [name] [name] Primary Care Jonesha Tsuchiya: [name] Other Clinician: Donavan Burnet Referring Furqan Gosselin: Treating Marquice Uddin/Extender: Oren Section in Treatment: 8 Visit Information History Since Last Visit All ordered tests and consults were completed: Yes Patient Arrived: Ambulatory Added or deleted any medications: No Arrival Time: 07:30 Any new allergies or adverse reactions: No Accompanied By: self Had a fall or experienced change in No Transfer Assistance: None activities of daily living that may affect Patient Identification Verified: Yes risk of falls: Secondary Verification Process Completed: Yes Signs or symptoms of abuse/neglect since last visito No Patient Requires Transmission-Based Precautions: No Hospitalized since last visit: No Patient Has Alerts: Yes Implantable device outside of the clinic excluding No cellular tissue based products placed in the center since last visit: Pain Present Now: No Electronic Signature(s) Signed: 03/21/2021 8:08:58 AM By: Donavan Burnet EMT Entered By: Donavan Burnet on 03/21/2021 07:57:13 -------------------------------------------------------------------------------- Encounter Discharge Information Details Patient Name: Date of Service: [name] Hawthorne B. 03/21/2021 8:00 A M Medical Record Number: [id number] Patient Account Number: [id number] Date of Birth/Sex: Treating RN: May 09, 1951 (70 y.o. [name] [name] Primary Care Shaquitta Burbridge: [name] Other Clinician: Donavan Burnet Referring Madalynne Gutmann: Treating Atiana Levier/Extender: Oren Section in Treatment: 8 Encounter Discharge Information  Items Discharge Condition: Stable Ambulatory Status: Ambulatory Discharge Destination: Other (Note Required) Transportation: Private Auto Accompanied By: self Schedule Follow-up Appointment: No Clinical Summary of Care: Notes Patient goes to work after treatment. Electronic Signature(s) Signed: 03/21/2021 12:35:13 PM By: Donavan Burnet EMT Entered By: Donavan Burnet on 03/21/2021 12:25:35 -------------------------------------------------------------------------------- Vitals Details Patient Name: Date of Service: [name] Hawthorne B. 03/21/2021 8:00 A M Medical Record Number: [id number] Patient Account Number: [id number] Date of Birth/Sex: Treating RN: [birth year] (70 y.o. [name] [name] Primary Care Jayonna Meyering: [name] Other Clinician: Donavan Burnet Referring Makyah Lavigne: Treating Stokes Rattigan/Extender: Oren Section in Treatment: 8 Vital Signs Time Taken: 08:00 Temperature (F): 97.5 Height (in): 73 Pulse (bpm): 50 Weight (lbs): 222 Respiratory Rate (breaths/min): 18 Body Mass Index (BMI): 29.3 Blood Pressure (mmHg): 128/60 Capillary Blood Glucose (mg/dl): 127 Reference Range: 80 - 120 mg / dl Electronic Signature(s) Signed: 03/21/2021 8:08:58 AM By: Donavan Burnet EMT Entered By: Donavan Burnet on 03/21/2021 08:04:54

## 2021-03-21 NOTE — Progress Notes (Signed)
[name] [name], [name] [name] ([id number]) Visit Report for 03/21/2021 SuperBill Details Patient Name: Date of Service: [name] [name], [name] [name] 03/21/2021 Medical Record Number: [id number] Patient Account Number: [id number] Date of Birth/Sex: Treating RN: 1950-07-23 (70 y.o. [name] Primary Care Provider: [name] Other Clinician: Donavan Burnet Referring Provider: Treating Provider/Extender: Oren Section in Treatment: 8 Diagnosis Coding ICD-10 Codes Code Description [phone] Type 2 diabetes mellitus with foot ulcer L97.518 Non-pressure chronic ulcer of other part of right foot with other specified severity L97.511 Non-pressure chronic ulcer of other part of right foot limited to breakdown of skin M86.371 Chronic multifocal osteomyelitis, right ankle and foot E11.621 Type 2 diabetes mellitus with foot ulcer Facility Procedures CPT4 Code Description Modifier Quantity [id number] G0277-(Facility Use Only) HBOT full body chamber, 74mn , 4 ICD-10 Diagnosis Description E11.621 Type 2 diabetes mellitus with foot ulcer L97.518 Non-pressure chronic ulcer of other part of right foot with other specified severity L97.511 Non-pressure chronic ulcer of other part of right foot limited to breakdown of skin M86.371 Chronic multifocal osteomyelitis, right ankle and foot Physician Procedures Quantity CPT4 Code Description Modifier [id number] PHYS HYPERBARIC OXYGEN THERAPY 1 ICD-10 Diagnosis Description E11.621 Type 2 diabetes mellitus with foot ulcer L97.518 Non-pressure chronic ulcer of other part of right foot with other specified severity L97.511 Non-pressure chronic ulcer of other part of right foot limited to breakdown of skin M86.371 Chronic multifocal osteomyelitis, right ankle and foot Electronic Signature(s) Signed: 03/21/2021 12:35:13 PM By: SDonavan BurnetEMT Signed: 03/21/2021 12:48:17 PM By: HKalman ShanDO Entered By: SDonavan Burneton  03/21/2021 12:25:02

## 2021-03-21 NOTE — Progress Notes (Signed)
[name] [name], [name] [name] ([id number]) Visit Report for 03/21/2021 HBO Details Patient Name: Date of Service: [name] [name] 03/21/2021 8:00 A M Medical Record Number: [id number] Patient Account Number: [id number] Date of Birth/Sex: Treating RN: 1951-03-08 (70 y.o. [name] [name] Primary Care [name] [name]: [name] Other Clinician: Donavan [name] Referring [name] [name]: Treating [name] [name]/Extender: [name] [name] in Treatment: 8 HBO Treatment Course Details Treatment Course Number: 1 Ordering [name] [name]: [name] [name] T Treatments Ordered: otal 40 HBO Treatment Start Date: 01/27/2021 HBO Indication: Diabetic Ulcer(s) of the Lower Extremity HBO Treatment Details Treatment Number: 34 Patient Type: Outpatient Chamber Type: Monoplace Chamber Serial #: [id number] Treatment Protocol: 2.5 ATA with 90 minutes oxygen, with two 5 minute air breaks Treatment Details Compression Rate Down: 2.5 psi / minute De-Compression Rate Up: 2.5 psi / minute A breaks and breathing ir Compress Tx Pressure periods Decompress Decompress Begins Reached (leave unused spaces Begins Ends blank) Chamber Pressure (ATA 1 2.5 2.5 2.5 2.5 2.5 - - 2.5 1 ) Clock Time (24 hr) 08:25 08:36 09:06 09:11 09:41 09:46 - - 10:16 10:24 Treatment Length: 119 (minutes) Treatment Segments: 4 Vital Signs Capillary Blood Glucose Reference Range: 80 - 120 mg / dl HBO Diabetic Blood Glucose Intervention Range: <131 mg/dl or >249 mg/dl Type: Time Vitals Blood Pulse: Respiratory Temperature: Capillary Blood Glucose Pulse Action Taken: Pressure: Rate: Glucose (mg/dl): Meter #: Oximetry (%) Taken: Pre 08:00 128/60 50 18 97.5 127 8 oz Glucerna Shake given Pre 08:23 138 Post 10:25 127/51 77 18 98 101 Discharge per [facility] protocol Treatment Response Treatment Toleration: Well Treatment Completion Status: Treatment Completed without Adverse Event Electronic Signature(s) Signed: 03/21/2021 12:35:13 PM By:  [name] [name] EMT Signed: 03/21/2021 12:48:17 PM By: [name] Shan DO Entered By: [name] [name] on 03/21/2021 12:24:41 -------------------------------------------------------------------------------- HBO Safety Checklist Details Patient Name: Date of Service: [name] Hawthorne B. 03/21/2021 8:00 A M Medical Record Number: [id number] Patient Account Number: [id number] Date of Birth/Sex: Treating RN: 06-20-1951 (70 y.o. [name] [name] Primary Care [name] [name]: [name] Other Clinician: Donavan [name] Referring [name] [name]: Treating [name] [name]/Extender: [name] [name] in Treatment: 8 HBO Safety Checklist Items Safety Checklist Consent Form Signed Patient voided / foley secured and emptied When did you last eato 0700 Last dose of injectable or oral agent metformin after yesterday's HBO Tx Ostomy pouch emptied and vented if applicable NA All implantable devices assessed, documented and approved NA Intravenous access site secured and place NA Valuables secured Linens and cotton and cotton/polyester blend (less than 51% polyester) Personal oil-based products / skin lotions / body lotions removed Wigs or hairpieces removed NA Smoking or tobacco materials removed NA Books / newspapers / magazines / loose paper removed Cologne, aftershave, perfume and deodorant removed Jewelry removed (may wrap wedding band) Make-up removed NA Hair care products removed NA Battery operated devices (external) removed Heating patches and chemical warmers removed Titanium eyewear removed Nail polish cured greater than 10 hours NA Casting material cured greater than 10 hours NA Hearing aids removed NA Loose dentures or partials removed NA Prosthetics have been removed NA Patient demonstrates correct use of air break device (if applicable) Patient concerns have been addressed Patient grounding bracelet on and cord attached to chamber Specifics for Inpatients  (complete in addition to above) Medication sheet sent with patient NA Intravenous medications needed or due during therapy sent with patient NA Drainage tubes (e.g. nasogastric tube or chest tube secured and vented) NA Endotracheal or Tracheotomy tube secured NA Cuff deflated of air and inflated  with saline NA Airway suctioned NA Electronic Signature(s) Signed: 03/21/2021 8:08:58 AM By: [name] [name] EMT Entered By: [name] [name] on 03/21/2021 08:05:58

## 2021-03-22 DIAGNOSIS — R69 Illness, unspecified: Secondary | ICD-10-CM | POA: Diagnosis not present

## 2021-03-22 DIAGNOSIS — Z008 Encounter for other general examination: Secondary | ICD-10-CM | POA: Diagnosis not present

## 2021-03-22 DIAGNOSIS — E785 Hyperlipidemia, unspecified: Secondary | ICD-10-CM | POA: Diagnosis not present

## 2021-03-22 DIAGNOSIS — G4733 Obstructive sleep apnea (adult) (pediatric): Secondary | ICD-10-CM | POA: Diagnosis not present

## 2021-03-22 DIAGNOSIS — E11621 Type 2 diabetes mellitus with foot ulcer: Secondary | ICD-10-CM | POA: Diagnosis not present

## 2021-03-22 DIAGNOSIS — L97501 Non-pressure chronic ulcer of other part of unspecified foot limited to breakdown of skin: Secondary | ICD-10-CM | POA: Diagnosis not present

## 2021-03-22 DIAGNOSIS — E1151 Type 2 diabetes mellitus with diabetic peripheral angiopathy without gangrene: Secondary | ICD-10-CM | POA: Diagnosis not present

## 2021-03-22 DIAGNOSIS — E1142 Type 2 diabetes mellitus with diabetic polyneuropathy: Secondary | ICD-10-CM | POA: Diagnosis not present

## 2021-03-22 DIAGNOSIS — E1143 Type 2 diabetes mellitus with diabetic autonomic (poly)neuropathy: Secondary | ICD-10-CM | POA: Diagnosis not present

## 2021-03-22 DIAGNOSIS — E1165 Type 2 diabetes mellitus with hyperglycemia: Secondary | ICD-10-CM | POA: Diagnosis not present

## 2021-03-24 ENCOUNTER — Encounter (HOSPITAL_BASED_OUTPATIENT_CLINIC_OR_DEPARTMENT_OTHER): Payer: Medicare HMO | Admitting: Internal Medicine

## 2021-03-24 ENCOUNTER — Other Ambulatory Visit: Payer: Self-pay

## 2021-03-24 DIAGNOSIS — G4733 Obstructive sleep apnea (adult) (pediatric): Secondary | ICD-10-CM | POA: Diagnosis not present

## 2021-03-24 DIAGNOSIS — E1151 Type 2 diabetes mellitus with diabetic peripheral angiopathy without gangrene: Secondary | ICD-10-CM | POA: Diagnosis not present

## 2021-03-24 DIAGNOSIS — E11621 Type 2 diabetes mellitus with foot ulcer: Secondary | ICD-10-CM

## 2021-03-24 DIAGNOSIS — L97511 Non-pressure chronic ulcer of other part of right foot limited to breakdown of skin: Secondary | ICD-10-CM | POA: Diagnosis not present

## 2021-03-24 DIAGNOSIS — E1142 Type 2 diabetes mellitus with diabetic polyneuropathy: Secondary | ICD-10-CM | POA: Diagnosis not present

## 2021-03-24 DIAGNOSIS — L97518 Non-pressure chronic ulcer of other part of right foot with other specified severity: Secondary | ICD-10-CM

## 2021-03-24 DIAGNOSIS — E1122 Type 2 diabetes mellitus with diabetic chronic kidney disease: Secondary | ICD-10-CM | POA: Diagnosis not present

## 2021-03-24 DIAGNOSIS — I2510 Atherosclerotic heart disease of native coronary artery without angina pectoris: Secondary | ICD-10-CM | POA: Diagnosis not present

## 2021-03-24 DIAGNOSIS — M86371 Chronic multifocal osteomyelitis, right ankle and foot: Secondary | ICD-10-CM | POA: Diagnosis not present

## 2021-03-24 DIAGNOSIS — I252 Old myocardial infarction: Secondary | ICD-10-CM | POA: Diagnosis not present

## 2021-03-24 LAB — GLUCOSE, CAPILLARY
Glucose-Capillary: 136 mg/dL — ABNORMAL HIGH (ref 70–99)
Glucose-Capillary: 104 mg/dL — ABNORMAL HIGH (ref 70–99)

## 2021-03-25 ENCOUNTER — Encounter (HOSPITAL_BASED_OUTPATIENT_CLINIC_OR_DEPARTMENT_OTHER): Payer: Medicare HMO | Admitting: Internal Medicine

## 2021-03-25 DIAGNOSIS — I252 Old myocardial infarction: Secondary | ICD-10-CM | POA: Diagnosis not present

## 2021-03-25 DIAGNOSIS — L97519 Non-pressure chronic ulcer of other part of right foot with unspecified severity: Secondary | ICD-10-CM | POA: Diagnosis not present

## 2021-03-25 DIAGNOSIS — M86371 Chronic multifocal osteomyelitis, right ankle and foot: Secondary | ICD-10-CM | POA: Diagnosis not present

## 2021-03-25 DIAGNOSIS — E1151 Type 2 diabetes mellitus with diabetic peripheral angiopathy without gangrene: Secondary | ICD-10-CM | POA: Diagnosis not present

## 2021-03-25 DIAGNOSIS — E1142 Type 2 diabetes mellitus with diabetic polyneuropathy: Secondary | ICD-10-CM | POA: Diagnosis not present

## 2021-03-25 DIAGNOSIS — E1122 Type 2 diabetes mellitus with diabetic chronic kidney disease: Secondary | ICD-10-CM | POA: Diagnosis not present

## 2021-03-25 DIAGNOSIS — E11621 Type 2 diabetes mellitus with foot ulcer: Secondary | ICD-10-CM | POA: Diagnosis not present

## 2021-03-25 DIAGNOSIS — G4733 Obstructive sleep apnea (adult) (pediatric): Secondary | ICD-10-CM | POA: Diagnosis not present

## 2021-03-25 DIAGNOSIS — L97511 Non-pressure chronic ulcer of other part of right foot limited to breakdown of skin: Secondary | ICD-10-CM | POA: Diagnosis not present

## 2021-03-25 DIAGNOSIS — I2510 Atherosclerotic heart disease of native coronary artery without angina pectoris: Secondary | ICD-10-CM | POA: Diagnosis not present

## 2021-03-25 DIAGNOSIS — L97518 Non-pressure chronic ulcer of other part of right foot with other specified severity: Secondary | ICD-10-CM | POA: Diagnosis not present

## 2021-03-25 LAB — GLUCOSE, CAPILLARY
Glucose-Capillary: 117 mg/dL — ABNORMAL HIGH (ref 70–99)
Glucose-Capillary: 121 mg/dL — ABNORMAL HIGH (ref 70–99)
Glucose-Capillary: 107 mg/dL — ABNORMAL HIGH (ref 70–99)

## 2021-03-26 ENCOUNTER — Encounter (HOSPITAL_BASED_OUTPATIENT_CLINIC_OR_DEPARTMENT_OTHER): Payer: Medicare HMO | Admitting: Physician Assistant

## 2021-03-26 ENCOUNTER — Other Ambulatory Visit: Payer: Self-pay

## 2021-03-26 DIAGNOSIS — L97511 Non-pressure chronic ulcer of other part of right foot limited to breakdown of skin: Secondary | ICD-10-CM | POA: Diagnosis not present

## 2021-03-26 DIAGNOSIS — G4733 Obstructive sleep apnea (adult) (pediatric): Secondary | ICD-10-CM | POA: Diagnosis not present

## 2021-03-26 DIAGNOSIS — I252 Old myocardial infarction: Secondary | ICD-10-CM | POA: Diagnosis not present

## 2021-03-26 DIAGNOSIS — E1122 Type 2 diabetes mellitus with diabetic chronic kidney disease: Secondary | ICD-10-CM | POA: Diagnosis not present

## 2021-03-26 DIAGNOSIS — M86371 Chronic multifocal osteomyelitis, right ankle and foot: Secondary | ICD-10-CM | POA: Diagnosis not present

## 2021-03-26 DIAGNOSIS — E11621 Type 2 diabetes mellitus with foot ulcer: Secondary | ICD-10-CM | POA: Diagnosis not present

## 2021-03-26 DIAGNOSIS — L97518 Non-pressure chronic ulcer of other part of right foot with other specified severity: Secondary | ICD-10-CM | POA: Diagnosis not present

## 2021-03-26 DIAGNOSIS — E1151 Type 2 diabetes mellitus with diabetic peripheral angiopathy without gangrene: Secondary | ICD-10-CM | POA: Diagnosis not present

## 2021-03-26 DIAGNOSIS — I2510 Atherosclerotic heart disease of native coronary artery without angina pectoris: Secondary | ICD-10-CM | POA: Diagnosis not present

## 2021-03-26 DIAGNOSIS — E1142 Type 2 diabetes mellitus with diabetic polyneuropathy: Secondary | ICD-10-CM | POA: Diagnosis not present

## 2021-03-26 LAB — GLUCOSE, CAPILLARY
Glucose-Capillary: 146 mg/dL — ABNORMAL HIGH (ref 70–99)
Glucose-Capillary: 108 mg/dL — ABNORMAL HIGH (ref 70–99)

## 2021-03-26 NOTE — Progress Notes (Signed)
HARLOW, MCDERMOTT ([id number]) Visit Report for 03/24/2021 SuperBill Details Patient Name: Date of Service: [name] [name], [name] [name] 03/24/2021 Medical Record Number: [id number] Patient Account Number: [id number] Date of Birth/Sex: Treating RN: April 17, 1951 (70 y.o. [name] Primary Care Provider: [name] Other Clinician: Donavan Burnet Referring Provider: Treating Provider/Extender: Oren Section in Treatment: 8 Diagnosis Coding ICD-10 Codes Code Description [phone] Type 2 diabetes mellitus with foot ulcer L97.518 Non-pressure chronic ulcer of other part of right foot with other specified severity L97.511 Non-pressure chronic ulcer of other part of right foot limited to breakdown of skin M86.371 Chronic multifocal osteomyelitis, right ankle and foot E11.621 Type 2 diabetes mellitus with foot ulcer Facility Procedures CPT4 Code Description Modifier Quantity [id number]-(Facility Use Only) HBOT full body chamber, 94mn , 4 ICD-10 Diagnosis Description E11.621 Type 2 diabetes mellitus with foot ulcer L97.518 Non-pressure chronic ulcer of other part of right foot with other specified severity L97.511 Non-pressure chronic ulcer of other part of right foot limited to breakdown of skin M86.371 Chronic multifocal osteomyelitis, right ankle and foot Physician Procedures Quantity CPT4 Code Description Modifier [id number] PHYS HYPERBARIC OXYGEN THERAPY 1 ICD-10 Diagnosis Description E11.621 Type 2 diabetes mellitus with foot ulcer L97.518 Non-pressure chronic ulcer of other part of right foot with other specified severity L97.511 Non-pressure chronic ulcer of other part of right foot limited to breakdown of skin M86.371 Chronic multifocal osteomyelitis, right ankle and foot Electronic Signature(s) Signed: 03/24/2021 4:47:36 PM By: HKalman ShanDO Signed: 03/26/2021 6:17:22 PM By: SDonavan BurnetEMT Entered By: SDonavan Burneton  03/24/2021 14:18:19

## 2021-03-26 NOTE — Progress Notes (Signed)
BAKARI, VERNET ([id number]) Visit Report for 03/26/2021 Arrival Information Details Patient Name: Date of Service: [name] [name], [name] [name] 03/26/2021 8:00 A M Medical Record Number: [id number] Patient Account Number: [id number] Date of Birth/Sex: Treating RN: 1951-06-13 (70 y.o. Ulyses Amor, Vaughan Basta Primary Care Bao Bazen: [name] Other Clinician: Donavan Burnet Referring Keshawna Dix: Treating Rochanda Harpham/Extender: Tally Joe in Treatment: 9 Visit Information History Since Last Visit All ordered tests and consults were completed: Yes Patient Arrived: Ambulatory Added or deleted any medications: No Arrival Time: 07:30 Any new allergies or adverse reactions: No Accompanied By: self Had a fall or experienced change in No Transfer Assistance: None activities of daily living that may affect Patient Identification Verified: Yes risk of falls: Secondary Verification Process Completed: Yes Signs or symptoms of abuse/neglect since last visito No Patient Requires Transmission-Based Precautions: No Hospitalized since last visit: No Patient Has Alerts: Yes Implantable device outside of the clinic excluding No cellular tissue based products placed in the center since last visit: Pain Present Now: No Electronic Signature(s) Signed: 03/26/2021 6:23:42 PM By: Donavan Burnet EMT Entered By: Donavan Burnet on 03/26/2021 08:45:45 -------------------------------------------------------------------------------- Encounter Discharge Information Details Patient Name: Date of Service: [name] Hawthorne B. 03/26/2021 8:00 A M Medical Record Number: [id number] Patient Account Number: [id number] Date of Birth/Sex: Treating RN: 10/10/1950 (70 y.o. [name] Primary Care Wayburn Shaler: [name] Other Clinician: Donavan Burnet Referring Kierstynn Babich: Treating Audie Wieser/Extender: Tally Joe in Treatment: 9 Encounter Discharge Information  Items Discharge Condition: Stable Ambulatory Status: Ambulatory Discharge Destination: Other (Note Required) Transportation: Private Auto Accompanied By: self Schedule Follow-up Appointment: No Clinical Summary of Care: Notes Patient goes to work after treatment. Electronic Signature(s) Signed: 03/26/2021 6:23:42 PM By: Donavan Burnet EMT Entered By: Donavan Burnet on 03/26/2021 10:51:59 -------------------------------------------------------------------------------- Vitals Details Patient Name: Date of Service: [name] Hawthorne B. 03/26/2021 8:00 A M Medical Record Number: [id number] Patient Account Number: [id number] Date of Birth/Sex: Treating RN: 05-Jan-1951 (70 y.o. [name] Primary Care Jadore Mcguffin: [name] Other Clinician: Donavan Burnet Referring Monty Mccarrell: Treating Malyna Budney/Extender: Tally Joe in Treatment: 9 Vital Signs Time Taken: 07:57 Temperature (F): 98.2 Height (in): 73 Pulse (bpm): 57 Weight (lbs): 222 Respiratory Rate (breaths/min): 16 Body Mass Index (BMI): 29.3 Blood Pressure (mmHg): 141/76 Capillary Blood Glucose (mg/dl): 146 Reference Range: 80 - 120 mg / dl Electronic Signature(s) Signed: 03/26/2021 6:23:42 PM By: Donavan Burnet EMT Entered By: Donavan Burnet on 03/26/2021 08:51:32

## 2021-03-26 NOTE — Progress Notes (Signed)
[name], [name] ([id number]) Visit Report for 03/26/2021 HBO Details Patient Name: Date of Service: [name] [name], [name] [name] 03/26/2021 8:00 A M Medical Record Number: [id number] Patient Account Number: [id number] Date of Birth/Sex: Treating RN: 02-27-1951 (70 y.o. [name] [name] Primary Care Marcayla Budge: [name] Other Clinician: Donavan Burnet Referring Shateria Paternostro: Treating Kyen Taite/Extender: Tally Joe in Treatment: 9 HBO Treatment Course Details Treatment Course Number: 1 Ordering Haily Caley: Linton Ham T Treatments Ordered: otal 40 HBO Treatment Start Date: 01/27/2021 HBO Indication: Diabetic Ulcer(s) of the Lower Extremity HBO Treatment Details Treatment Number: 36 Patient Type: Outpatient Chamber Type: Monoplace Chamber Serial #: [id number] Treatment Protocol: 2.5 ATA with 90 minutes oxygen, with two 5 minute air breaks Treatment Details Compression Rate Down: 2.5 psi / minute De-Compression Rate Up: 2.5 psi / minute A breaks and breathing ir Compress Tx Pressure periods Decompress Decompress Begins Reached (leave unused spaces Begins Ends blank) Chamber Pressure (ATA 1 2.5 2.5 2.5 2.5 2.5 - - 2.5 1 ) Clock Time (24 hr) 08:10 08:21 08:51 08:56 09:26 09:31 - - 10:01 10:10 Treatment Length: 120 (minutes) Treatment Segments: 4 Vital Signs Capillary Blood Glucose Reference Range: 80 - 120 mg / dl HBO Diabetic Blood Glucose Intervention Range: <131 mg/dl or >249 mg/dl Type: Time Vitals Blood Pulse: Respiratory Capillary Blood Glucose Pulse Action Temperature: Taken: Pressure: Rate: Glucose (mg/dl): Meter #: Oximetry (%) Taken: Pre 07:57 141/76 57 16 98.2 146 Post 10:11 146/68 48 16 97.7 108 discharge per protocol Treatment Response Treatment Toleration: Well Treatment Completion Status: Treatment Completed without Adverse Event Electronic Signature(s) Signed: 03/26/2021 5:28:57 PM By: Worthy Keeler PA-C Signed: 03/26/2021 6:23:42 PM  By: Donavan Burnet EMT Entered By: Donavan Burnet on 03/26/2021 10:50:51 -------------------------------------------------------------------------------- HBO Safety Checklist Details Patient Name: Date of Service: [name] [name]. 03/26/2021 8:00 A M Medical Record Number: [id number] Patient Account Number: [id number] Date of Birth/Sex: Treating RN: 1951-03-21 (70 y.o. [name] [name] Primary Care Aavya Shafer: [name] Other Clinician: Donavan Burnet Referring Izamar Linden: Treating Micharl Helmes/Extender: Tally Joe in Treatment: 9 HBO Safety Checklist Items Safety Checklist Consent Form Signed Patient voided / foley secured and emptied When did you last eato 0700 Last dose of injectable or oral agent n/a Ostomy pouch emptied and vented if applicable NA All implantable devices assessed, documented and approved NA Intravenous access site secured and place NA Valuables secured Linens and cotton and cotton/polyester blend (less than 51% polyester) Personal oil-based products / skin lotions / body lotions removed Wigs or hairpieces removed NA Smoking or tobacco materials removed NA Books / newspapers / magazines / loose paper removed Cologne, aftershave, perfume and deodorant removed Jewelry removed (may wrap wedding band) Make-up removed NA Hair care products removed NA Battery operated devices (external) removed Heating patches and chemical warmers removed Titanium eyewear removed Nail polish cured greater than 10 hours NA Casting material cured greater than 10 hours NA Hearing aids removed NA Loose dentures or partials removed NA Prosthetics have been removed NA Patient demonstrates correct use of air break device (if applicable) Patient concerns have been addressed Patient grounding bracelet on and cord attached to chamber Specifics for Inpatients (complete in addition to above) Medication sheet sent with  patient NA Intravenous medications needed or due during therapy sent with patient NA Drainage tubes (e.g. nasogastric tube or chest tube secured and vented) NA Endotracheal or Tracheotomy tube secured NA Cuff deflated of air and inflated with saline NA Airway suctioned NA Notes Paper version used  prior to treatment start. Data entered afterward. Electronic Signature(s) Signed: 03/26/2021 6:23:42 PM By: Donavan Burnet EMT Entered By: Donavan Burnet on 03/26/2021 08:53:17

## 2021-03-26 NOTE — Progress Notes (Signed)
ARTIN, BOUCH ([id number]) Visit Report for 03/24/2021 Arrival Information Details Patient Name: Date of Service: [name] [name], [name] [name] 03/24/2021 8:00 A M Medical Record Number: [id number] Patient Account Number: [id number] Date of Birth/Sex: Treating RN: Aug 03, 1950 (70 y.o. [name] Primary Care Ha Placeres: [name] Other Clinician: Donavan Burnet Referring Pearley Millington: Treating Dyanara Cozza/Extender: Oren Section in Treatment: 8 Visit Information History Since Last Visit All ordered tests and consults were completed: Yes Patient Arrived: Ambulatory Added or deleted any medications: No Arrival Time: 07:35 Any new allergies or adverse reactions: No Accompanied By: self Had a fall or experienced change in No Transfer Assistance: None activities of daily living that may affect Patient Identification Verified: Yes risk of falls: Secondary Verification Process Completed: Yes Signs or symptoms of abuse/neglect since last visito No Patient Requires Transmission-Based Precautions: No Hospitalized since last visit: No Patient Has Alerts: Yes Implantable device outside of the clinic excluding No cellular tissue based products placed in the center since last visit: Pain Present Now: No Electronic Signature(s) Signed: 03/26/2021 6:17:22 PM By: Donavan Burnet EMT Entered By: Donavan Burnet on 03/24/2021 08:48:57 -------------------------------------------------------------------------------- Encounter Discharge Information Details Patient Name: Date of Service: [name] Hawthorne B. 03/24/2021 8:00 A M Medical Record Number: [id number] Patient Account Number: [id number] Date of Birth/Sex: Treating RN: December 14, 1950 (70 y.o. [name] Primary Care Joneisha Miles: [name] Other Clinician: Donavan Burnet Referring Kyion Gautier: Treating Jaheem Hedgepath/Extender: Oren Section in Treatment: 8 Encounter Discharge Information  Items Discharge Condition: Stable Ambulatory Status: Ambulatory Discharge Destination: Other (Note Required) Transportation: Private Auto Accompanied By: self Schedule Follow-up Appointment: No Clinical Summary of Care: Notes Patient goes to work after treatment. Electronic Signature(s) Signed: 03/26/2021 6:17:22 PM By: Donavan Burnet EMT Entered By: Donavan Burnet on 03/24/2021 14:18:55 -------------------------------------------------------------------------------- Vitals Details Patient Name: Date of Service: [name] Hawthorne B. 03/24/2021 8:00 A M Medical Record Number: [id number] Patient Account Number: [id number] Date of Birth/Sex: Treating RN: July 13, 1951 (70 y.o. [name] Primary Care Yolani Vo: [name] Other Clinician: Donavan Burnet Referring Tevis Conger: Treating Tenille Morrill/Extender: Oren Section in Treatment: 8 Vital Signs Time Taken: 08:02 Temperature (F): 98.1 Height (in): 73 Pulse (bpm): 57 Weight (lbs): 222 Respiratory Rate (breaths/min): 18 Body Mass Index (BMI): 29.3 Blood Pressure (mmHg): 110/57 Capillary Blood Glucose (mg/dl): 136 Reference Range: 80 - 120 mg / dl Electronic Signature(s) Signed: 03/26/2021 6:17:22 PM By: Donavan Burnet EMT Entered By: Donavan Burnet on 03/24/2021 08:50:50

## 2021-03-26 NOTE — Progress Notes (Signed)
IZIAHA, MAHOOD ([id number]) Visit Report for 03/26/2021 SuperBill Details Patient Name: Date of Service: [name] [name], [name] [name] 03/26/2021 Medical Record Number: [id number] Patient Account Number: [id number] Date of Birth/Sex: Treating RN: 1950-12-14 (70 y.o. [name] Primary Care Provider: [name] Other Clinician: Donavan Burnet Referring Provider: Treating Provider/Extender: Tally Joe in Treatment: 9 Diagnosis Coding ICD-10 Codes Code Description [phone] Type 2 diabetes mellitus with foot ulcer L97.518 Non-pressure chronic ulcer of other part of right foot with other specified severity L97.511 Non-pressure chronic ulcer of other part of right foot limited to breakdown of skin M86.371 Chronic multifocal osteomyelitis, right ankle and foot E11.621 Type 2 diabetes mellitus with foot ulcer Facility Procedures CPT4 Code Description Modifier Quantity [id number] G0277-(Facility Use Only) HBOT full body chamber, 63mn , 4 ICD-10 Diagnosis Description E11.621 Type 2 diabetes mellitus with foot ulcer L97.518 Non-pressure chronic ulcer of other part of right foot with other specified severity L97.511 Non-pressure chronic ulcer of other part of right foot limited to breakdown of skin M86.371 Chronic multifocal osteomyelitis, right ankle and foot Physician Procedures Quantity CPT4 Code Description Modifier [id number] PHYS HYPERBARIC OXYGEN THERAPY 1 ICD-10 Diagnosis Description E11.621 Type 2 diabetes mellitus with foot ulcer L97.518 Non-pressure chronic ulcer of other part of right foot with other specified severity L97.511 Non-pressure chronic ulcer of other part of right foot limited to breakdown of skin M86.371 Chronic multifocal osteomyelitis, right ankle and foot Electronic Signature(s) Signed: 03/26/2021 5:28:57 PM By: SWorthy KeelerPA-C Signed: 03/26/2021 6:23:42 PM By: SDonavan BurnetEMT Entered By: SDonavan Burneton  03/26/2021 10:51:10

## 2021-03-26 NOTE — Progress Notes (Signed)
KEYNON, THUL ([id number]) Visit Report for 03/24/2021 HBO Details Patient Name: Date of Service: [name] [name], [name] [name] 03/24/2021 8:00 A M Medical Record Number: [id number] Patient Account Number: [id number] Date of Birth/Sex: Treating RN: 03-Oct-1950 (70 y.o. [name] Primary Care Shanelle Clontz: [name] Other Clinician: Donavan Burnet Referring Leonda Cristo: Treating Dajsha Massaro/Extender: Oren Section in Treatment: 8 HBO Treatment Course Details Treatment Course Number: 1 Ordering Shawnta Zimbelman: Bernerd Pho Treatments Ordered: otal 40 HBO Treatment Start Date: 01/27/2021 HBO Indication: Diabetic Ulcer(s) of the Lower Extremity HBO Treatment Details Treatment Number: 35 Patient Type: Outpatient Chamber Type: Monoplace Chamber Serial #: [id number] Treatment Protocol: 2.5 ATA with 90 minutes oxygen, with two 5 minute air breaks Treatment Details Compression Rate Down: 2.5 psi / minute De-Compression Rate Up: 2.5 psi / minute A breaks and breathing ir Compress Tx Pressure periods Decompress Decompress Begins Reached (leave unused spaces Begins Ends blank) Chamber Pressure (ATA 1 2.5 2.5 2.5 2.5 2.5 - - 2.5 1 ) Clock Time (24 hr) 08:20 08:31 09:01 09:06 09:36 09:41 - - 10:11 10:20 Treatment Length: 120 (minutes) Treatment Segments: 4 Vital Signs Capillary Blood Glucose Reference Range: 80 - 120 mg / dl HBO Diabetic Blood Glucose Intervention Range: <131 mg/dl or >249 mg/dl Type: Time Vitals Blood Pulse: Respiratory Capillary Blood Glucose Pulse Action Temperature: Taken: Pressure: Rate: Glucose (mg/dl): Meter #: Oximetry (%) Taken: Pre 08:02 110/57 57 18 98.1 136 Post 10:35 133/73 50 16 97.7 104 discharge per protocol Treatment Response Treatment Toleration: Well Treatment Completion Status: Treatment Completed without Adverse Event Physician HBO Attestation: I certify that I supervised this HBO treatment in accordance with  Medicare guidelines. A trained emergency response team is readily available per Yes hospital policies and procedures. Continue HBOT as ordered. Yes Electronic Signature(s) Signed: 03/24/2021 4:47:36 PM By: Kalman Shan DO Entered By: Kalman Shan on 03/24/2021 16:44:03 -------------------------------------------------------------------------------- HBO Safety Checklist Details Patient Name: Date of Service: [name] [name], [name] [name] 03/24/2021 8:00 A M Medical Record Number: [id number] Patient Account Number: [id number] Date of Birth/Sex: Treating RN: 1950/10/04 (70 y.o. [name] Primary Care Linsy Ehresman: [name] Other Clinician: Donavan Burnet Referring Bethanne Mule: Treating Jeremaih Klima/Extender: Oren Section in Treatment: 8 HBO Safety Checklist Items Safety Checklist Consent Form Signed Patient voided / foley secured and emptied When did you last eato 0700 Last dose of injectable or oral agent Metformin yesterday Ostomy pouch emptied and vented if applicable NA All implantable devices assessed, documented and approved NA Intravenous access site secured and place NA Valuables secured Linens and cotton and cotton/polyester blend (less than 51% polyester) Personal oil-based products / skin lotions / body lotions removed Wigs or hairpieces removed NA Smoking or tobacco materials removed NA Books / newspapers / magazines / loose paper removed Cologne, aftershave, perfume and deodorant removed Jewelry removed (may wrap wedding band) Make-up removed NA Hair care products removed Battery operated devices (external) removed Heating patches and chemical warmers removed Titanium eyewear removed Nail polish cured greater than 10 hours NA Casting material cured greater than 10 hours NA Hearing aids removed NA Loose dentures or partials removed NA Prosthetics have been removed NA Patient demonstrates correct use of air break device (if  applicable) Patient concerns have been addressed Patient grounding bracelet on and cord attached to chamber Specifics for Inpatients (complete in addition to above) Medication sheet sent with patient NA Intravenous medications needed or due during therapy sent with patient NA Drainage tubes (e.g. nasogastric tube or chest tube secured and  vented) NA Endotracheal or Tracheotomy tube secured NA Cuff deflated of air and inflated with saline NA Airway suctioned NA Notes Paper version used prior to treatment. Data entered afterward. Electronic Signature(s) Signed: 03/26/2021 6:17:22 PM By: Donavan Burnet EMT Entered By: Donavan Burnet on 03/24/2021 14:49:42

## 2021-03-27 ENCOUNTER — Encounter (HOSPITAL_BASED_OUTPATIENT_CLINIC_OR_DEPARTMENT_OTHER): Payer: Medicare HMO | Admitting: Internal Medicine

## 2021-03-27 DIAGNOSIS — E11621 Type 2 diabetes mellitus with foot ulcer: Secondary | ICD-10-CM | POA: Diagnosis not present

## 2021-03-27 DIAGNOSIS — E1142 Type 2 diabetes mellitus with diabetic polyneuropathy: Secondary | ICD-10-CM | POA: Diagnosis not present

## 2021-03-27 DIAGNOSIS — E1122 Type 2 diabetes mellitus with diabetic chronic kidney disease: Secondary | ICD-10-CM | POA: Diagnosis not present

## 2021-03-27 DIAGNOSIS — I252 Old myocardial infarction: Secondary | ICD-10-CM | POA: Diagnosis not present

## 2021-03-27 DIAGNOSIS — M86371 Chronic multifocal osteomyelitis, right ankle and foot: Secondary | ICD-10-CM | POA: Diagnosis not present

## 2021-03-27 DIAGNOSIS — E1151 Type 2 diabetes mellitus with diabetic peripheral angiopathy without gangrene: Secondary | ICD-10-CM | POA: Diagnosis not present

## 2021-03-27 DIAGNOSIS — I2510 Atherosclerotic heart disease of native coronary artery without angina pectoris: Secondary | ICD-10-CM | POA: Diagnosis not present

## 2021-03-27 DIAGNOSIS — L97511 Non-pressure chronic ulcer of other part of right foot limited to breakdown of skin: Secondary | ICD-10-CM | POA: Diagnosis not present

## 2021-03-27 DIAGNOSIS — G4733 Obstructive sleep apnea (adult) (pediatric): Secondary | ICD-10-CM | POA: Diagnosis not present

## 2021-03-27 DIAGNOSIS — L97518 Non-pressure chronic ulcer of other part of right foot with other specified severity: Secondary | ICD-10-CM | POA: Diagnosis not present

## 2021-03-27 LAB — GLUCOSE, CAPILLARY
Glucose-Capillary: 136 mg/dL — ABNORMAL HIGH (ref 70–99)
Glucose-Capillary: 102 mg/dL — ABNORMAL HIGH (ref 70–99)

## 2021-03-27 NOTE — Progress Notes (Signed)
JAVONE, PISTONE ([id number]) Visit Report for 03/25/2021 Arrival Information Details Patient Name: Date of Service: [name] [name], [name] [name] 03/25/2021 8:00 A M Medical Record Number: [id number] Patient Account Number: [id number] Date of Birth/Sex: Treating RN: 21-Apr-1951 (70 y.o. [name], [name] Primary Care Deyana Wnuk: [name] Other Clinician: Donavan Burnet Referring Woodrow Dulski: Treating Bobbyjoe Pabst/Extender: Lanier Clam in Treatment: 8 Visit Information History Since Last Visit All ordered tests and consults were completed: Yes Patient Arrived: Ambulatory Added or deleted any medications: No Arrival Time: 07:30 Any new allergies or adverse reactions: No Accompanied By: self Had a fall or experienced change in No Transfer Assistance: None activities of daily living that may affect Patient Identification Verified: Yes risk of falls: Secondary Verification Process Completed: Yes Signs or symptoms of abuse/neglect since last visito No Patient Requires Transmission-Based Precautions: No Hospitalized since last visit: No Patient Has Alerts: Yes Implantable device outside of the clinic excluding No cellular tissue based products placed in the center since last visit: Pain Present Now: No Electronic Signature(s) Signed: 03/27/2021 6:51:22 PM By: Donavan Burnet EMT Entered By: Donavan Burnet on 03/25/2021 11:42:30 -------------------------------------------------------------------------------- Encounter Discharge Information Details Patient Name: Date of Service: [name] Hawthorne B. 03/25/2021 8:00 A M Medical Record Number: [id number] Patient Account Number: [id number] Date of Birth/Sex: Treating RN: 1950/10/13 (70 y.o. [name], [name] Primary Care Sonika Levins: [name] Other Clinician: Donavan Burnet Referring James Lafalce: Treating Jessy Calixte/Extender: Lanier Clam in Treatment: 8 Encounter Discharge Information  Items Discharge Condition: Stable Ambulatory Status: Ambulatory Discharge Destination: Other (Note Required) Transportation: Private Auto Accompanied By: self Schedule Follow-up Appointment: No Clinical Summary of Care: Electronic Signature(s) Signed: 03/27/2021 6:51:22 PM By: Donavan Burnet EMT Entered By: Donavan Burnet on 03/25/2021 11:58:55 -------------------------------------------------------------------------------- Vitals Details Patient Name: Date of Service: [name] Hawthorne B. 03/25/2021 8:00 A M Medical Record Number: [id number] Patient Account Number: [id number] Date of Birth/Sex: Treating RN: 22-Aug-1950 (70 y.o. [name], [name] Primary Care Amelio Brosky: [name] Other Clinician: Donavan Burnet Referring Eldon Zietlow: Treating Jawana Reagor/Extender: Lanier Clam in Treatment: 8 Vital Signs Time Taken: 07:55 Temperature (F): 97.4 Height (in): 73 Pulse (bpm): 66 Weight (lbs): 222 Respiratory Rate (breaths/min): 18 Body Mass Index (BMI): 29.3 Blood Pressure (mmHg): 134/71 Capillary Blood Glucose (mg/dl): 121 Reference Range: 80 - 120 mg / dl Electronic Signature(s) Signed: 03/27/2021 6:51:22 PM By: Donavan Burnet EMT Entered By: Donavan Burnet on 03/25/2021 11:43:51

## 2021-03-27 NOTE — Progress Notes (Signed)
[name] [name] ([id number]) Visit Report for 03/27/2021 Arrival Information Details Patient Name: Date of Service: [name] [name] 03/27/2021 8:00 A M Medical Record Number: [id number] Patient Account Number: [id number] Date of Birth/Sex: Treating RN: 06-Apr-1951 (70 y.o. [name] [name], [name] Primary Care Mayzie Caughlin: [name] Other Clinician: Donavan Burnet Referring Carina Chaplin: Treating Naiah Donahoe/Extender: Lanier Clam in Treatment: 9 Visit Information History Since Last Visit All ordered tests and consults were completed: Yes Patient Arrived: Ambulatory Added or deleted any medications: No Arrival Time: 07:57 Any new allergies or adverse reactions: No Accompanied By: self Had a fall or experienced change in No Transfer Assistance: None activities of daily living that may affect Patient Identification Verified: Yes risk of falls: Secondary Verification Process Completed: Yes Signs or symptoms of abuse/neglect since last visito No Patient Requires Transmission-Based Precautions: No Hospitalized since last visit: No Patient Has Alerts: Yes Implantable device outside of the clinic excluding No cellular tissue based products placed in the center since last visit: Pain Present Now: No Electronic Signature(s) Signed: 03/27/2021 6:51:22 PM By: Donavan Burnet EMT Entered By: Donavan Burnet on 03/27/2021 11:42:00 -------------------------------------------------------------------------------- Vitals Details Patient Name: Date of Service: [name] Hawthorne B. 03/27/2021 8:00 A M Medical Record Number: [id number] Patient Account Number: [id number] Date of Birth/Sex: Treating RN: [birth year] (70 y.o. [name] [name], [name] Primary Care Jashae Wiggs: [name] Other Clinician: Donavan Burnet Referring Mishayla Sliwinski: Treating Jenavie Stanczak/Extender: Lanier Clam in Treatment: 9 Vital Signs Time Taken: 08:05 Temperature (F): 98.1 Height  (in): 73 Pulse (bpm): 55 Weight (lbs): 222 Respiratory Rate (breaths/min): 18 Body Mass Index (BMI): 29.3 Blood Pressure (mmHg): 149/76 Capillary Blood Glucose (mg/dl): 136 Reference Range: 80 - 120 mg / dl Electronic Signature(s) Signed: 03/27/2021 6:51:22 PM By: Donavan Burnet EMT Entered By: Donavan Burnet on 03/27/2021 11:49:56

## 2021-03-27 NOTE — Progress Notes (Signed)
GARREY, SIGLIN ([id number]) Visit Report for 03/27/2021 SuperBill Details Patient Name: Date of Service: [name] [name], [name] [name] 03/27/2021 Medical Record Number: [id number] Patient Account Number: [id number] Date of Birth/Sex: Treating RN: [birth year] (70 y.o. [name] [name], [name] Primary Care Provider: [name] Other Clinician: Donavan Burnet Referring Provider: Treating Provider/Extender: Lanier Clam in Treatment: 9 Diagnosis Coding ICD-10 Codes Code Description [phone] Type 2 diabetes mellitus with foot ulcer L97.518 Non-pressure chronic ulcer of other part of right foot with other specified severity L97.511 Non-pressure chronic ulcer of other part of right foot limited to breakdown of skin M86.371 Chronic multifocal osteomyelitis, right ankle and foot E11.621 Type 2 diabetes mellitus with foot ulcer Facility Procedures CPT4 Code Description Modifier Quantity [id number]-(Facility Use Only) HBOT full body chamber, 50mn , 4 ICD-10 Diagnosis Description E11.621 Type 2 diabetes mellitus with foot ulcer L97.518 Non-pressure chronic ulcer of other part of right foot with other specified severity L97.511 Non-pressure chronic ulcer of other part of right foot limited to breakdown of skin M86.371 Chronic multifocal osteomyelitis, right ankle and foot Physician Procedures Quantity CPT4 Code Description Modifier [id number] PHYS HYPERBARIC OXYGEN THERAPY 1 ICD-10 Diagnosis Description E11.621 Type 2 diabetes mellitus with foot ulcer L97.518 Non-pressure chronic ulcer of other part of right foot with other specified severity L97.511 Non-pressure chronic ulcer of other part of right foot limited to breakdown of skin M86.371 Chronic multifocal osteomyelitis, right ankle and foot Electronic Signature(s) Signed: 03/27/2021 4:57:28 PM By: RLinton HamMD Signed: 03/27/2021 6:51:22 PM By: SDonavan BurnetEMT Entered By: SDonavan Burneton  03/27/2021 11:53:26

## 2021-03-27 NOTE — Progress Notes (Signed)
NEIZAN, HEINO ([id number]) Visit Report for 03/25/2021 HBO Details Patient Name: Date of Service: [name] [name], [name] [name] 03/25/2021 8:00 A M Medical Record Number: [id number] Patient Account Number: [id number] Date of Birth/Sex: Treating RN: December 25, 1950 (70 y.o. [name] [name], [name] [name] Primary Care [name] [name]: [name] [name] Other Clinician: Donavan [name] Referring Law Corsino: Treating [name] [name]/Extender: [name] [name] in Treatment: 8 HBO Treatment Course Details Treatment Course Number: 1 Ordering [name] [name]: [name] [name] Treatments Ordered: otal 40 HBO Treatment Start Date: 01/27/2021 HBO Indication: Diabetic Ulcer(s) of the Lower Extremity HBO Treatment Details Treatment Number: Treatment Not Started: Patient Choice Patient Type: Outpatient Chamber Type: Monoplace Chamber Serial #: [id number] Treatment Protocol: 2.5 ATA with 90 minutes oxygen, with two 5 minute air breaks Treatment Details A breaks and ir breathing Compress Tx Pressure Decompress Decompress periods Begins Reached Begins Ends (leave unused spaces blank) Chamber Pressure (ATA 1 2.5 2.5 2.5 2.5 2.5 - - 2.5 1 ) Clock Time (24 hr) - - - - - - --- - Treatment Length: (minutes) Treatment Segments: Vital Signs Capillary Blood Glucose Reference Range: 80 - 120 mg / dl HBO Diabetic Blood Glucose Intervention Range: <131 mg/dl or >249 mg/dl Type: Time Vitals Blood Pulse: Respiratory Temperature: Capillary Blood Glucose Pulse Action Taken: Pressure: Rate: Glucose (mg/dl): Meter #: Oximetry (%) Taken: Pre 07:55 134/71 66 18 97.4 121 8 oz Glucerna Shake given Pre 08:25 117 blood glucose decreased given 8 oz Glucerna Pre 09:00 111 blood glucose decreased wait to test, pt choice Treatment Response Treatment Completion Status: Treatment Not Started Reason: Patient Choice Brena Windsor Notes Patient did not dive today because of marginally low blood sugars Electronic Signature(s) Signed: 03/26/2021  3:40:47 PM By: [name] Ham MD Entered By: [name] [name] on 03/25/2021 16:50:54 -------------------------------------------------------------------------------- HBO Safety Checklist Details Patient Name: Date of Service: [name] Hawthorne B. 03/25/2021 8:00 A M Medical Record Number: [id number] Patient Account Number: [id number] Date of Birth/Sex: Treating RN: [name] (70 y.o. [name] [name], [name] [name] Primary Care Astra Gregg: Other Clinician: Frederick [name] Referring [name] [name]: Treating [name] [name]/Extender: [name] [name] in Treatment: 8 HBO Safety Checklist Items Safety Checklist Consent Form Signed Patient voided / foley secured and emptied When did you last eato 0700 Last dose of injectable or oral agent 0700 metformin Ostomy pouch emptied and vented if applicable NA All implantable devices assessed, documented and approved NA Intravenous access site secured and place NA Valuables secured Linens and cotton and cotton/polyester blend (less than 51% polyester) Personal oil-based products / skin lotions / body lotions removed Wigs or hairpieces removed NA Smoking or tobacco materials removed Books / newspapers / magazines / loose paper removed Cologne, aftershave, perfume and deodorant removed Jewelry removed (may wrap wedding band) NA Make-up removed NA Hair care products removed NA Battery operated devices (external) removed Heating patches and chemical warmers removed Titanium eyewear removed Nail polish cured greater than 10 hours NA Casting material cured greater than 10 hours NA Hearing aids removed NA Loose dentures or partials removed NA Prosthetics have been removed NA Patient demonstrates correct use of air break device (if applicable) Patient concerns have been addressed Patient grounding bracelet on and cord attached to chamber Specifics for Inpatients (complete in addition to above) Medication sheet sent with  patient NA Intravenous medications needed or due during therapy sent with patient NA Drainage tubes (e.g. nasogastric tube or chest tube secured and vented) NA Endotracheal or Tracheotomy tube secured NA Cuff deflated of air and inflated with saline NA Airway suctioned NA Electronic  Signature(s) Signed: 03/27/2021 6:51:22 PM By: [name] [name] EMT Entered By: [name] [name] on 03/25/2021 11:45:30

## 2021-03-27 NOTE — Progress Notes (Signed)
[name] [name], [name] [name] ([id number]) Visit Report for 03/27/2021 HBO Details Patient Name: Date of Service: [name] [name], [name] [name] 03/27/2021 8:00 A M Medical Record Number: [id number] Patient Account Number: [id number] Date of Birth/Sex: Treating RN: [birth year] (70 y.o. [name] [name], [name] Primary Care Atiya Yera: [name] Other Clinician: Donavan Burnet Referring Arali Somera: Treating Lielle Vandervort/Extender: Lanier Clam in Treatment: 9 HBO Treatment Course Details Treatment Course Number: 1 Ordering Blen Ransome: Linton Ham T Treatments Ordered: otal 40 HBO Treatment Start Date: 01/27/2021 HBO Indication: Diabetic Ulcer(s) of the Lower Extremity HBO Treatment Details Treatment Number: 37 Patient Type: Outpatient Chamber Type: Monoplace Chamber Serial #: [id number] Treatment Protocol: 2.5 ATA with 90 minutes oxygen, with two 5 minute air breaks Treatment Details Compression Rate Down: 2.5 psi / minute De-Compression Rate Up: 2.5 psi / minute A breaks and breathing ir Compress Tx Pressure periods Decompress Decompress Begins Reached (leave unused spaces Begins Ends blank) Chamber Pressure (ATA 1 2.5 2.5 2.5 2.5 2.5 - - 2.5 1 ) Clock Time (24 hr) 08:09 08:20 08:50 08:55 09:25 09:30 - - 10:00 10:09 Treatment Length: 120 (minutes) Treatment Segments: 4 Vital Signs Capillary Blood Glucose Reference Range: 80 - 120 mg / dl HBO Diabetic Blood Glucose Intervention Range: <131 mg/dl or >249 mg/dl Time Vitals Blood Respiratory Capillary Blood Glucose Pulse Action Type: Pulse: Temperature: Taken: Pressure: Rate: Glucose (mg/dl): Meter #: Oximetry (%) Taken: Pre 08:05 149/76 55 18 98.1 136 Post 10:18 156/55 49 16 97.7 102 Treatment Response Treatment Toleration: Well Treatment Completion Status: Treatment Completed without Adverse Event Additional Procedure Documentation Tissue Sevierity: Necrosis of bone Hayde Kilgour Notes No concerns with treatment given Physician  HBO Attestation: I certify that I supervised this HBO treatment in accordance with Medicare guidelines. A trained emergency response team is readily available per Yes hospital policies and procedures. Continue HBOT as ordered. Yes Electronic Signature(s) Signed: 03/27/2021 4:57:28 PM By: Linton Ham MD Entered By: Linton Ham on 03/27/2021 14:43:17 -------------------------------------------------------------------------------- HBO Safety Checklist Details Patient Name: Date of Service: [name] Hawthorne B. 03/27/2021 8:00 A M Medical Record Number: [id number] Patient Account Number: [id number] Date of Birth/Sex: Treating RN: [birth year] (70 y.o. [name] [name], [name] Primary Care Rukaya Kleinschmidt: [name] Other Clinician: Donavan Burnet Referring Laurren Lepkowski: Treating Burnie Hank/Extender: Lanier Clam in Treatment: 9 HBO Safety Checklist Items Safety Checklist Consent Form Signed Patient voided / foley secured and emptied When did you last eato 0630 Last dose of injectable or oral agent metformin half dose 0630 Ostomy pouch emptied and vented if applicable NA All implantable devices assessed, documented and approved NA Intravenous access site secured and place NA Valuables secured Linens and cotton and cotton/polyester blend (less than 51% polyester) Personal oil-based products / skin lotions / body lotions removed Wigs or hairpieces removed NA Smoking or tobacco materials removed NA Books / newspapers / magazines / loose paper removed Cologne, aftershave, perfume and deodorant removed Jewelry removed (may wrap wedding band) Make-up removed NA Hair care products removed NA Battery operated devices (external) removed Heating patches and chemical warmers removed Titanium eyewear removed Nail polish cured greater than 10 hours NA Casting material cured greater than 10 hours NA Hearing aids removed NA Loose dentures or partials  removed NA Prosthetics have been removed NA Patient demonstrates correct use of air break device (if applicable) Patient concerns have been addressed Patient grounding bracelet on and cord attached to chamber Specifics for Inpatients (complete in addition to above) Medication sheet sent with patient NA Intravenous medications needed or due during  therapy sent with patient NA Drainage tubes (e.g. nasogastric tube or chest tube secured and vented) NA Endotracheal or Tracheotomy tube secured NA Cuff deflated of air and inflated with saline NA Airway suctioned NA Notes Paper version of HBO Safety Checklist Items was used prior to treatment start. Data were entered later. Electronic Signature(s) Signed: 03/27/2021 6:51:22 PM By: Donavan Burnet EMT Entered By: Donavan Burnet on 03/27/2021 11:54:18

## 2021-03-27 NOTE — Progress Notes (Signed)
BENICIO, LARRANAGA ([id number]) Visit Report for 03/25/2021 SuperBill Details Patient Name: Date of Service: CHADLEY, RICARTE 03/25/2021 Medical Record Number: [id number] Patient Account Number: [id number] Date of Birth/Sex: Treating RN: [birth year] (70 y.o. [name], [name] Primary Care Provider: [name] Other Clinician: Donavan Burnet Referring Provider: Treating Provider/Extender: Lanier Clam in Treatment: 8 Diagnosis Coding ICD-10 Codes Code Description [phone] Type 2 diabetes mellitus with foot ulcer L97.518 Non-pressure chronic ulcer of other part of right foot with other specified severity L97.511 Non-pressure chronic ulcer of other part of right foot limited to breakdown of skin M86.371 Chronic multifocal osteomyelitis, right ankle and foot E11.621 Type 2 diabetes mellitus with foot ulcer Facility Procedures CPT4 Code Description Modifier Quantity [id number] - WOUND CARE VISIT-LEV 2 EST PT 1 Electronic Signature(s) Signed: 03/26/2021 3:40:47 PM By: Linton Ham MD Signed: 03/27/2021 6:51:22 PM By: Donavan Burnet EMT Entered By: Donavan Burnet on 03/25/2021 11:49:56

## 2021-03-28 ENCOUNTER — Encounter (HOSPITAL_BASED_OUTPATIENT_CLINIC_OR_DEPARTMENT_OTHER): Payer: Medicare HMO | Admitting: Internal Medicine

## 2021-03-29 DIAGNOSIS — M109 Gout, unspecified: Secondary | ICD-10-CM | POA: Diagnosis not present

## 2021-03-29 DIAGNOSIS — M25532 Pain in left wrist: Secondary | ICD-10-CM | POA: Diagnosis not present

## 2021-03-29 DIAGNOSIS — Z6829 Body mass index (BMI) 29.0-29.9, adult: Secondary | ICD-10-CM | POA: Diagnosis not present

## 2021-03-31 ENCOUNTER — Encounter (HOSPITAL_BASED_OUTPATIENT_CLINIC_OR_DEPARTMENT_OTHER): Payer: Medicare HMO | Admitting: Internal Medicine

## 2021-03-31 ENCOUNTER — Other Ambulatory Visit: Payer: Self-pay

## 2021-03-31 DIAGNOSIS — L97511 Non-pressure chronic ulcer of other part of right foot limited to breakdown of skin: Secondary | ICD-10-CM

## 2021-03-31 DIAGNOSIS — E1142 Type 2 diabetes mellitus with diabetic polyneuropathy: Secondary | ICD-10-CM | POA: Diagnosis not present

## 2021-03-31 DIAGNOSIS — G4733 Obstructive sleep apnea (adult) (pediatric): Secondary | ICD-10-CM | POA: Diagnosis not present

## 2021-03-31 DIAGNOSIS — E11621 Type 2 diabetes mellitus with foot ulcer: Secondary | ICD-10-CM

## 2021-03-31 DIAGNOSIS — E1151 Type 2 diabetes mellitus with diabetic peripheral angiopathy without gangrene: Secondary | ICD-10-CM | POA: Diagnosis not present

## 2021-03-31 DIAGNOSIS — L97518 Non-pressure chronic ulcer of other part of right foot with other specified severity: Secondary | ICD-10-CM

## 2021-03-31 DIAGNOSIS — I2510 Atherosclerotic heart disease of native coronary artery without angina pectoris: Secondary | ICD-10-CM | POA: Diagnosis not present

## 2021-03-31 DIAGNOSIS — E1122 Type 2 diabetes mellitus with diabetic chronic kidney disease: Secondary | ICD-10-CM | POA: Diagnosis not present

## 2021-03-31 DIAGNOSIS — M86371 Chronic multifocal osteomyelitis, right ankle and foot: Secondary | ICD-10-CM

## 2021-03-31 DIAGNOSIS — I252 Old myocardial infarction: Secondary | ICD-10-CM | POA: Diagnosis not present

## 2021-03-31 LAB — GLUCOSE, CAPILLARY
Glucose-Capillary: 137 mg/dL — ABNORMAL HIGH (ref 70–99)
Glucose-Capillary: 110 mg/dL — ABNORMAL HIGH (ref 70–99)

## 2021-03-31 NOTE — Progress Notes (Signed)
LARRON, ARMOR ([id number]) Visit Report for 03/31/2021 SuperBill Details Patient Name: Date of Service: RYSZARD, SOCARRAS 03/31/2021 Medical Record Number: [id number] Patient Account Number: [id number] Date of Birth/Sex: Treating RN: [birth year] (70 y.o. [name] Primary Care Provider: [name] Other Clinician: Donavan Burnet Referring Provider: Treating Provider/Extender: Oren Section in Treatment: 9 Diagnosis Coding ICD-10 Codes Code Description [phone] Type 2 diabetes mellitus with foot ulcer L97.518 Non-pressure chronic ulcer of other part of right foot with other specified severity L97.511 Non-pressure chronic ulcer of other part of right foot limited to breakdown of skin M86.371 Chronic multifocal osteomyelitis, right ankle and foot E11.621 Type 2 diabetes mellitus with foot ulcer Facility Procedures CPT4 Code Description Modifier Quantity [id number] G0277-(Facility Use Only) HBOT full body chamber, 37min , 4 ICD-10 Diagnosis Description E11.621 Type 2 diabetes mellitus with foot ulcer L97.518 Non-pressure chronic ulcer of other part of right foot with other specified severity L97.511 Non-pressure chronic ulcer of other part of right foot limited to breakdown of skin M86.371 Chronic multifocal osteomyelitis, right ankle and foot Physician Procedures Quantity CPT4 Code Description Modifier [id number] - WC PHYS HYPERBARIC OXYGEN THERAPY 1 ICD-10 Diagnosis Description E11.621 Type 2 diabetes mellitus with foot ulcer L97.518 Non-pressure chronic ulcer of other part of right foot with other specified severity L97.511 Non-pressure chronic ulcer of other part of right foot limited to breakdown of skin M86.371 Chronic multifocal osteomyelitis, right ankle and foot Electronic Signature(s) Signed: 03/31/2021 1:16:20 PM By: Donavan Burnet EMT Signed: 03/31/2021 5:10:17 PM By: Kalman Shan DO Entered By: Donavan Burnet on  03/31/2021 11:01:39

## 2021-03-31 NOTE — Progress Notes (Signed)
LAMIER, RUMMEL ([id number]) Visit Report for 03/31/2021 HBO Details Patient Name: Date of Service: JEROME, BARRETT 03/31/2021 8:00 A M Medical Record Number: [id number] Patient Account Number: [id number] Date of Birth/Sex: Treating RN: [birth year] (70 y.o. [name] Primary Care Gaddiel Cullens: [name] Other Clinician: Donavan Burnet Referring Kerrington Sova: Treating Shantell Belongia/Extender: Oren Section in Treatment: 9 HBO Treatment Course Details Treatment Course Number: 1 Ordering Jizel Cheeks: Linton Ham T Treatments Ordered: otal 40 HBO Treatment Start Date: 01/27/2021 HBO Indication: Diabetic Ulcer(s) of the Lower Extremity HBO Treatment Details Treatment Number: 38 Patient Type: Outpatient Chamber Type: Monoplace Chamber Serial #: [id number] Treatment Protocol: 2.5 ATA with 90 minutes oxygen, with two 5 minute air breaks Treatment Details Compression Rate Down: 2.5 psi / minute De-Compression Rate Up: 2.5 psi / minute A breaks and breathing ir Compress Tx Pressure periods Decompress Decompress Begins Reached (leave unused spaces Begins Ends blank) Chamber Pressure (ATA 1 2.5 2.5 2.5 2.5 2.5 - - 2.5 1 ) Clock Time (24 hr) 08:26 08:35 09:05 09:10 09:40 09:45 - - 10:15 10:24 Treatment Length: 118 (minutes) Treatment Segments: 4 Vital Signs Capillary Blood Glucose Reference Range: 80 - 120 mg / dl HBO Diabetic Blood Glucose Intervention Range: <131 mg/dl or >249 mg/dl Type: Time Vitals Blood Pulse: Respiratory Capillary Blood Glucose Pulse Action Temperature: Taken: Pressure: Rate: Glucose (mg/dl): Meter #: Oximetry (%) Taken: Pre 08:12 130/69 50 14 97.8 137 Post 10:26 155/68 45 16 97.8 110 discharge per protocol Treatment Response Treatment Toleration: Well Treatment Completion Status: Treatment Completed without Adverse Event Physician HBO Attestation: I certify that I supervised this HBO treatment in accordance with  Medicare guidelines. A trained emergency response team is readily available per Yes hospital policies and procedures. Continue HBOT as ordered. Yes Electronic Signature(s) Signed: 03/31/2021 5:10:17 PM By: Kalman Shan DO Previous Signature: 03/31/2021 1:16:20 PM Version By: Donavan Burnet EMT Entered By: Kalman Shan on 03/31/2021 17:08:40 -------------------------------------------------------------------------------- HBO Safety Checklist Details Patient Name: Date of Service: [name] 03/31/2021 8:00 A M Medical Record Number: [id number] Patient Account Number: [id number] Date of Birth/Sex: Treating RN: 11-11-1950 (70 y.o. [name] Primary Care Yuleidy Rappleye: [name] Other Clinician: Donavan Burnet Referring Demar Shad: Treating Kyung Muto/Extender: Oren Section in Treatment: 9 HBO Safety Checklist Items Safety Checklist Consent Form Signed Patient voided / foley secured and emptied When did you last eato 0700 Last dose of injectable or oral agent half dose metformin 0700 Ostomy pouch emptied and vented if applicable NA All implantable devices assessed, documented and approved NA Intravenous access site secured and place NA Valuables secured Linens and cotton and cotton/polyester blend (less than 51% polyester) Personal oil-based products / skin lotions / body lotions removed Wigs or hairpieces removed NA Smoking or tobacco materials removed NA Books / newspapers / magazines / loose paper removed Cologne, aftershave, perfume and deodorant removed Jewelry removed (may wrap wedding band) NA Make-up removed NA Hair care products removed NA Battery operated devices (external) removed Heating patches and chemical warmers removed Titanium eyewear removed Nail polish cured greater than 10 hours NA Casting material cured greater than 10 hours NA Hearing aids removed NA Loose dentures or partials  removed NA Prosthetics have been removed NA Patient demonstrates correct use of air break device (if applicable) Patient concerns have been addressed Patient grounding bracelet on and cord attached to chamber Specifics for Inpatients (complete in addition to above) Medication sheet sent with patient NA Intravenous medications needed or due during therapy  sent with patient NA Drainage tubes (e.g. nasogastric tube or chest tube secured and vented) NA Endotracheal or Tracheotomy tube secured NA Cuff deflated of air and inflated with saline NA Airway suctioned NA Notes Paper version used prior to treatment start. Data entered thereafter. Electronic Signature(s) Signed: 03/31/2021 1:16:20 PM By: Donavan Burnet EMT Entered By: Donavan Burnet on 03/31/2021 10:57:22

## 2021-03-31 NOTE — Progress Notes (Signed)
FREE, DIOP ([id number]) Visit Report for 03/31/2021 Arrival Information Details Patient Name: Date of Service: [name] [name], [name] [name] 03/31/2021 8:00 A M Medical Record Number: [id number] Patient Account Number: [id number] Date of Birth/Sex: Treating RN: 07/19/1950 (70 y.o. [name] Primary Care Carmel Garfield: [name] Other Clinician: Donavan Burnet Referring Janelle Culton: Treating Shirla Hodgkiss/Extender: Oren Section in Treatment: 9 Visit Information History Since Last Visit All ordered tests and consults were completed: Yes Patient Arrived: Ambulatory Added or deleted any medications: No Arrival Time: 07:43 Any new allergies or adverse reactions: No Accompanied By: self Had a fall or experienced change in No Transfer Assistance: None activities of daily living that may affect Patient Identification Verified: Yes risk of falls: Secondary Verification Process Completed: Yes Signs or symptoms of abuse/neglect since last visito No Patient Requires Transmission-Based Precautions: No Hospitalized since last visit: No Patient Has Alerts: Yes Implantable device outside of the clinic excluding No cellular tissue based products placed in the center since last visit: Pain Present Now: No Electronic Signature(s) Signed: 03/31/2021 1:16:20 PM By: Donavan Burnet EMT Entered By: Donavan Burnet on 03/31/2021 09:01:00 -------------------------------------------------------------------------------- Encounter Discharge Information Details Patient Name: Date of Service: [name] Hawthorne B. 03/31/2021 8:00 A M Medical Record Number: [id number] Patient Account Number: [id number] Date of Birth/Sex: Treating RN: 04/30/1951 (70 y.o. [name] Primary Care Hudsyn Barich: [name] Other Clinician: Donavan Burnet Referring Lauro Manlove: Treating Albertine Lafoy/Extender: Oren Section in Treatment: 9 Encounter Discharge Information  Items Discharge Condition: Stable Ambulatory Status: Ambulatory Discharge Destination: Other (Note Required) Transportation: Private Auto Accompanied By: self Schedule Follow-up Appointment: No Clinical Summary of Care: Notes Patient goes to work after treatment. Electronic Signature(s) Signed: 03/31/2021 1:16:20 PM By: Donavan Burnet EMT Entered By: Donavan Burnet on 03/31/2021 11:02:11 -------------------------------------------------------------------------------- Vitals Details Patient Name: Date of Service: [name] Hawthorne B. 03/31/2021 8:00 A M Medical Record Number: [id number] Patient Account Number: [id number] Date of Birth/Sex: Treating RN: October 13, 1950 (70 y.o. [name] Primary Care Antonisha Waskey: [name] Other Clinician: Donavan Burnet Referring Vannesa Abair: Treating Ova Gillentine/Extender: Oren Section in Treatment: 9 Vital Signs Time Taken: 08:12 Temperature (F): 97.8 Height (in): 73 Pulse (bpm): 50 Weight (lbs): 222 Respiratory Rate (breaths/min): 14 Body Mass Index (BMI): 29.3 Blood Pressure (mmHg): 130/69 Capillary Blood Glucose (mg/dl): 137 Reference Range: 80 - 120 mg / dl Electronic Signature(s) Signed: 03/31/2021 1:16:20 PM By: Donavan Burnet EMT Entered By: Donavan Burnet on 03/31/2021 09:02:15

## 2021-04-01 ENCOUNTER — Encounter (HOSPITAL_BASED_OUTPATIENT_CLINIC_OR_DEPARTMENT_OTHER): Payer: Medicare HMO | Admitting: Internal Medicine

## 2021-04-01 DIAGNOSIS — I2510 Atherosclerotic heart disease of native coronary artery without angina pectoris: Secondary | ICD-10-CM | POA: Diagnosis not present

## 2021-04-01 DIAGNOSIS — E1142 Type 2 diabetes mellitus with diabetic polyneuropathy: Secondary | ICD-10-CM | POA: Diagnosis not present

## 2021-04-01 DIAGNOSIS — L97511 Non-pressure chronic ulcer of other part of right foot limited to breakdown of skin: Secondary | ICD-10-CM | POA: Diagnosis not present

## 2021-04-01 DIAGNOSIS — E1151 Type 2 diabetes mellitus with diabetic peripheral angiopathy without gangrene: Secondary | ICD-10-CM | POA: Diagnosis not present

## 2021-04-01 DIAGNOSIS — G4733 Obstructive sleep apnea (adult) (pediatric): Secondary | ICD-10-CM | POA: Diagnosis not present

## 2021-04-01 DIAGNOSIS — M86371 Chronic multifocal osteomyelitis, right ankle and foot: Secondary | ICD-10-CM | POA: Diagnosis not present

## 2021-04-01 DIAGNOSIS — I252 Old myocardial infarction: Secondary | ICD-10-CM | POA: Diagnosis not present

## 2021-04-01 DIAGNOSIS — L97514 Non-pressure chronic ulcer of other part of right foot with necrosis of bone: Secondary | ICD-10-CM | POA: Diagnosis not present

## 2021-04-01 DIAGNOSIS — E1122 Type 2 diabetes mellitus with diabetic chronic kidney disease: Secondary | ICD-10-CM | POA: Diagnosis not present

## 2021-04-01 DIAGNOSIS — L97518 Non-pressure chronic ulcer of other part of right foot with other specified severity: Secondary | ICD-10-CM | POA: Diagnosis not present

## 2021-04-01 DIAGNOSIS — E11621 Type 2 diabetes mellitus with foot ulcer: Secondary | ICD-10-CM | POA: Diagnosis not present

## 2021-04-01 LAB — GLUCOSE, CAPILLARY
Glucose-Capillary: 118 mg/dL — ABNORMAL HIGH (ref 70–99)
Glucose-Capillary: 125 mg/dL — ABNORMAL HIGH (ref 70–99)
Glucose-Capillary: 117 mg/dL — ABNORMAL HIGH (ref 70–99)

## 2021-04-01 NOTE — Progress Notes (Signed)
[name], [name] ([id number]) Visit Report for 04/01/2021 HBO Details Patient Name: Date of Service: [name] [name], [name] [name] 04/01/2021 8:00 A M Medical Record Number: [id number] Patient Account Number: [id number] Date of Birth/Sex: Treating RN: 05-Oct-1950 (70 y.o. [name] [name], [place] Primary Care Lorita Forinash: [name] Other Clinician: Donavan Burnet Referring Juwaun Inskeep: Treating Aleana Fifita/Extender: Lanier Clam in Treatment: 9 HBO Treatment Course Details Treatment Course Number: 1 Ordering Tobechukwu Emmick: Linton Ham T Treatments Ordered: otal 40 HBO Treatment Start Date: 01/27/2021 HBO Indication: Diabetic Ulcer(s) of the Lower Extremity HBO Treatment Details Treatment Number: 39 Patient Type: Outpatient Chamber Type: Monoplace Chamber Serial #: [id number] Treatment Protocol: 2.5 ATA with 90 minutes oxygen, with two 5 minute air breaks Treatment Details Compression Rate Down: 2.5 psi / minute De-Compression Rate Up: 2.5 psi / minute A breaks and breathing ir Compress Tx Pressure periods Decompress Decompress Begins Reached (leave unused spaces Begins Ends blank) Chamber Pressure (ATA 1 2.5 2.5 2.5 2.5 2.5 - - 2.5 1 ) Clock Time (24 hr) 08:30 08:39 09:09 09:14 09:44 09:49 - - 10:19 10:28 Treatment Length: 118 (minutes) Treatment Segments: 4 Vital Signs Capillary Blood Glucose Reference Range: 80 - 120 mg / dl HBO Diabetic Blood Glucose Intervention Range: <131 mg/dl or >249 mg/dl Type: Time Vitals Blood Pulse: Respiratory Temperature: Capillary Blood Glucose Pulse Action Taken: Pressure: Rate: Glucose (mg/dl): Meter #: Oximetry (%) Taken: Pre 07:57 152/69 53 16 97.8 125 Pre 08:23 118 > 110 mg/dl, proceed with HBO per protocol Post 10:31 144/68 47 16 97.6 117 Discharge per [facility] protocol Treatment Response Treatment Toleration: Well Treatment Completion Status: Treatment Completed without Adverse Event Additional Procedure Documentation Tissue  Sevierity: Necrosis of bone Megahn Killings Notes No concerns with treatment given Physician HBO Attestation: I certify that I supervised this HBO treatment in accordance with Medicare guidelines. A trained emergency response team is readily available per Yes hospital policies and procedures. Continue HBOT as ordered. Yes Electronic Signature(s) Signed: 04/01/2021 4:30:53 PM By: Linton Ham MD Entered By: Linton Ham on 04/01/2021 13:29:42 -------------------------------------------------------------------------------- HBO Safety Checklist Details Patient Name: Date of Service: [name] Hawthorne B. 04/01/2021 8:00 A M Medical Record Number: [id number] Patient Account Number: [id number] Date of Birth/Sex: Treating RN: 11-Feb-1951 (70 y.o. [name] [name], [name] Primary Care Alania Overholt: [name] Other Clinician: Donavan Burnet Referring Damian Buckles: Treating Lance Huaracha/Extender: Lanier Clam in Treatment: 9 HBO Safety Checklist Items Safety Checklist Consent Form Signed Patient voided / foley secured and emptied When did you last eato 0700 Last dose of injectable or oral agent half dose metformin 0700 Ostomy pouch emptied and vented if applicable NA All implantable devices assessed, documented and approved NA Intravenous access site secured and place NA Valuables secured Linens and cotton and cotton/polyester blend (less than 51% polyester) Personal oil-based products / skin lotions / body lotions removed Wigs or hairpieces removed NA Smoking or tobacco materials removed NA Books / newspapers / magazines / loose paper removed Cologne, aftershave, perfume and deodorant removed Jewelry removed (may wrap wedding band) Make-up removed NA Hair care products removed NA Battery operated devices (external) removed Heating patches and chemical warmers removed Titanium eyewear removed Nail polish cured greater than 10 hours NA Casting material cured  greater than 10 hours NA Hearing aids removed NA Loose dentures or partials removed NA Prosthetics have been removed NA Patient demonstrates correct use of air break device (if applicable) Patient concerns have been addressed Patient grounding bracelet on and cord attached to chamber Specifics for Inpatients (complete in  addition to above) Medication sheet sent with patient NA Intravenous medications needed or due during therapy sent with patient NA Drainage tubes (e.g. nasogastric tube or chest tube secured and vented) NA Endotracheal or Tracheotomy tube secured NA Cuff deflated of air and inflated with saline NA Airway suctioned NA Notes Paper version used prior to start of treatment. Data entered afterward. Electronic Signature(s) Signed: 04/01/2021 2:00:59 PM By: Donavan Burnet EMT Entered By: Donavan Burnet on 04/01/2021 11:27:20

## 2021-04-01 NOTE — Progress Notes (Signed)
[name] [name], [name] [name] ([id number]) Visit Report for 04/01/2021 SuperBill Details Patient Name: Date of Service: [name] [name], [name] [name] 04/01/2021 Medical Record Number: [id number] Patient Account Number: [id number] Date of Birth/Sex: Treating RN: 11-08-1950 (70 y.o. [name], [name] Primary Care Provider: [name] Other Clinician: Donavan Burnet Referring Provider: Treating Provider/Extender: Lanier Clam in Treatment: 9 Diagnosis Coding ICD-10 Codes Code Description [phone] Type 2 diabetes mellitus with foot ulcer L97.518 Non-pressure chronic ulcer of other part of right foot with other specified severity L97.511 Non-pressure chronic ulcer of other part of right foot limited to breakdown of skin M86.371 Chronic multifocal osteomyelitis, right ankle and foot E11.621 Type 2 diabetes mellitus with foot ulcer Facility Procedures CPT4 Code Description Modifier Quantity [id number] G0277-(Facility Use Only) HBOT full body chamber, 45min , 4 ICD-10 Diagnosis Description E11.621 Type 2 diabetes mellitus with foot ulcer L97.518 Non-pressure chronic ulcer of other part of right foot with other specified severity L97.511 Non-pressure chronic ulcer of other part of right foot limited to breakdown of skin M86.371 Chronic multifocal osteomyelitis, right ankle and foot Physician Procedures Quantity CPT4 Code Description Modifier [id number] - WC PHYS HYPERBARIC OXYGEN THERAPY 1 ICD-10 Diagnosis Description E11.621 Type 2 diabetes mellitus with foot ulcer L97.518 Non-pressure chronic ulcer of other part of right foot with other specified severity L97.511 Non-pressure chronic ulcer of other part of right foot limited to breakdown of skin M86.371 Chronic multifocal osteomyelitis, right ankle and foot Electronic Signature(s) Signed: 04/01/2021 2:00:59 PM By: Donavan Burnet EMT Signed: 04/01/2021 4:30:53 PM By: Linton Ham MD Entered By: Donavan Burnet on  04/01/2021 11:33:36

## 2021-04-01 NOTE — Progress Notes (Signed)
[name] [name] ([id number]) Visit Report for 04/01/2021 Arrival Information Details Patient Name: Date of Service: [name] [name] 04/01/2021 8:00 A M Medical Record Number: [id number] Patient Account Number: [id number] Date of Birth/Sex: Treating RN: 1951-02-06 (70 y.o. [name], [name] Primary Care [name] [name]: [name] Other Clinician: Donavan Burnet Referring [name]: Treating [name] Visit Information History Since Last Visit All ordered tests and consults were completed: Yes Patient Arrived: Ambulatory Added or deleted any medications: No Arrival Time: 07:50 Any new allergies or adverse reactions: No Accompanied By: self Had a fall or experienced change in No Transfer Assistance: None activities of daily living that may affect Patient Identification Verified: Yes risk of falls: Secondary Verification Process Completed: Yes Signs or symptoms of abuse/neglect since last visito No Patient Requires Transmission-Based Precautions: No Hospitalized since last visit: No Patient Has Alerts: Yes Implantable device outside of the clinic excluding No cellular tissue based products placed in the center since last visit: Pain Present Now: No Electronic Signature(s) Signed: 04/01/2021 2:00:59 PM By: Donavan Burnet EMT Entered By: Donavan Burnet on 04/01/2021 08:40:42 -------------------------------------------------------------------------------- Encounter Discharge Information Details Patient Name: Date of Service: [name] Hawthorne B. 04/01/2021 8:00 A M Medical Record Number: [id number] Patient Account Number: [id number] Date of Birth/Sex: Treating RN: 1951/02/14 (70 y.o. [name], [name]: [name] Other Clinician: Donavan Burnet Referring Akylah Hascall: Treating Ellene Bloodsaw/Extender: Lanier Clam in Treatment: 9 Encounter Discharge Information  Items Discharge Condition: Stable Ambulatory Status: Ambulatory Discharge Destination: Other (Note Required) Transportation: Ambulance Accompanied By: self Schedule Follow-up Appointment: No Clinical Summary of Care: Notes Patient goes to work after treatment. Electronic Signature(s) Signed: 04/01/2021 2:00:59 PM By: Donavan Burnet EMT Entered By: Donavan Burnet on 04/01/2021 11:34:10 -------------------------------------------------------------------------------- Vitals Details Patient Name: Date of Service: [name] Hawthorne B. 04/01/2021 8:00 A M Medical Record Number: [id number] Patient Account Number: [id number] Date of Birth/Sex: Treating RN: 11-Mar-1951 (70 y.o. [name], [name] Primary Care [name]: [name] Other Clinician: Donavan Burnet Referring [name]: Treating Thelton Graca/Extender: Lanier Clam in Treatment: 9 Vital Signs Time Taken: 07:57 Temperature (F): 97.8 Height (in): 73 Pulse (bpm): 53 Weight (lbs): 222 Respiratory Rate (breaths/min): 16 Body Mass Index (BMI): 29.3 Blood Pressure (mmHg): 152/69 Capillary Blood Glucose (mg/dl): 125 Reference Range: 80 - 120 mg / dl Electronic Signature(s) Signed: 04/01/2021 2:00:59 PM By: Donavan Burnet EMT Entered By: Donavan Burnet on 04/01/2021 08:45:48

## 2021-04-02 ENCOUNTER — Encounter (HOSPITAL_BASED_OUTPATIENT_CLINIC_OR_DEPARTMENT_OTHER): Payer: Medicare HMO | Admitting: Physician Assistant

## 2021-04-02 ENCOUNTER — Other Ambulatory Visit: Payer: Self-pay

## 2021-04-02 DIAGNOSIS — I252 Old myocardial infarction: Secondary | ICD-10-CM | POA: Diagnosis not present

## 2021-04-02 DIAGNOSIS — M86371 Chronic multifocal osteomyelitis, right ankle and foot: Secondary | ICD-10-CM | POA: Diagnosis not present

## 2021-04-02 DIAGNOSIS — E11621 Type 2 diabetes mellitus with foot ulcer: Secondary | ICD-10-CM | POA: Diagnosis not present

## 2021-04-02 DIAGNOSIS — G4733 Obstructive sleep apnea (adult) (pediatric): Secondary | ICD-10-CM | POA: Diagnosis not present

## 2021-04-02 DIAGNOSIS — E1151 Type 2 diabetes mellitus with diabetic peripheral angiopathy without gangrene: Secondary | ICD-10-CM | POA: Diagnosis not present

## 2021-04-02 DIAGNOSIS — L97511 Non-pressure chronic ulcer of other part of right foot limited to breakdown of skin: Secondary | ICD-10-CM | POA: Diagnosis not present

## 2021-04-02 DIAGNOSIS — I2510 Atherosclerotic heart disease of native coronary artery without angina pectoris: Secondary | ICD-10-CM | POA: Diagnosis not present

## 2021-04-02 DIAGNOSIS — E1122 Type 2 diabetes mellitus with diabetic chronic kidney disease: Secondary | ICD-10-CM | POA: Diagnosis not present

## 2021-04-02 DIAGNOSIS — E1142 Type 2 diabetes mellitus with diabetic polyneuropathy: Secondary | ICD-10-CM | POA: Diagnosis not present

## 2021-04-02 DIAGNOSIS — L97518 Non-pressure chronic ulcer of other part of right foot with other specified severity: Secondary | ICD-10-CM | POA: Diagnosis not present

## 2021-04-02 LAB — GLUCOSE, CAPILLARY
Glucose-Capillary: 102 mg/dL — ABNORMAL HIGH (ref 70–99)
Glucose-Capillary: 110 mg/dL — ABNORMAL HIGH (ref 70–99)
Glucose-Capillary: 121 mg/dL — ABNORMAL HIGH (ref 70–99)

## 2021-04-03 NOTE — Progress Notes (Signed)
PRATHAM, CASSATT ([id number]) Visit Report for 04/02/2021 SuperBill Details Patient Name: Date of Service: [name] [name], [name] [name] 04/02/2021 Medical Record Number: [id number] Patient Account Number: [id number] Date of Birth/Sex: Treating RN: July 26, 1950 (70 y.o. [name] Primary Care Provider: [name] Other Clinician: Donavan Burnet Referring Provider: Treating Provider/Extender: Tally Joe in Treatment: 10 Diagnosis Coding ICD-10 Codes Code Description [phone] Type 2 diabetes mellitus with foot ulcer L97.518 Non-pressure chronic ulcer of other part of right foot with other specified severity L97.511 Non-pressure chronic ulcer of other part of right foot limited to breakdown of skin M86.371 Chronic multifocal osteomyelitis, right ankle and foot E11.621 Type 2 diabetes mellitus with foot ulcer Facility Procedures CPT4 Code Description Modifier Quantity [id number] G0277-(Facility Use Only) HBOT full body chamber, 21min , 4 ICD-10 Diagnosis Description E11.621 Type 2 diabetes mellitus with foot ulcer L97.518 Non-pressure chronic ulcer of other part of right foot with other specified severity L97.511 Non-pressure chronic ulcer of other part of right foot limited to breakdown of skin M86.371 Chronic multifocal osteomyelitis, right ankle and foot Physician Procedures Quantity CPT4 Code Description Modifier [id number] - WC PHYS HYPERBARIC OXYGEN THERAPY 1 ICD-10 Diagnosis Description E11.621 Type 2 diabetes mellitus with foot ulcer L97.518 Non-pressure chronic ulcer of other part of right foot with other specified severity L97.511 Non-pressure chronic ulcer of other part of right foot limited to breakdown of skin M86.371 Chronic multifocal osteomyelitis, right ankle and foot Electronic Signature(s) Signed: 04/03/2021 11:13:46 AM By: Donavan Burnet EMT Signed: 04/03/2021 2:21:00 PM By: Worthy Keeler PA-C Entered By: Donavan Burnet on  04/02/2021 10:53:17

## 2021-04-03 NOTE — Progress Notes (Signed)
[name], [name] ([id number]) Visit Report for 04/02/2021 HBO Details Patient Name: Date of Service: [name] [name], [place] 04/02/2021 8:00 A M Medical Record Number: [id number] Patient Account Number: [id number] Date of Birth/Sex: Treating RN: 07/13/1951 (70 y.o. [name] Primary Care Hadlea Furuya: [name] Other Clinician: Donavan Burnet Referring Layza Summa: Treating Bleu Minerd/Extender: Tally Joe in Treatment: 10 HBO Treatment Course Details Treatment Course Number: 1 Ordering Ladarrian Asencio: Linton Ham T Treatments Ordered: otal 40 HBO Treatment Start 01/27/2021 Date: HBO Indication: Diabetic Ulcer(s) of the Lower Extremity HBO Treatment End 04/02/2021 Date: HBO Discharge Treatment Series Complete; Improved Wound Outcome: Perfusion HBO Treatment Details Treatment Number: 40 Patient Type: Outpatient Chamber Type: Monoplace Chamber Serial #: [id number] Treatment Protocol: 2.5 ATA with 90 minutes oxygen, with two 5 minute air breaks Treatment Details Compression Rate Down: 2.5 psi / minute De-Compression Rate Up: 2.5 psi / minute A breaks and breathing ir Compress Tx Pressure periods Decompress Decompress Begins Reached (leave unused spaces Begins Ends blank) Chamber Pressure (ATA 1 2.5 2.5 2.5 2.5 2.5 - - 2.5 1 ) Clock Time (24 hr) 08:25 08:34 09:04 09:09 09:39 09:44 - - 10:14 10:24 Treatment Length: 119 (minutes) Treatment Segments: 4 Vital Signs Capillary Blood Glucose Reference Range: 80 - 120 mg / dl HBO Diabetic Blood Glucose Intervention Range: <131 mg/dl or >249 mg/dl Type: Time Vitals Blood Pulse: Respiratory Temperature: Capillary Blood Glucose Pulse Action Taken: Pressure: Rate: Glucose (mg/dl): Meter #: Oximetry (%) Taken: Pre 07:48 152/75 50 12 97.8 102 Post 10:27 144/74 48 14 97.6 121 Pre 08:19 110 > ,= 110 mg/dl, proceed with HBO per protocol Treatment Response Treatment Toleration: Well Treatment Completion Status:  Treatment Completed without Adverse Event Electronic Signature(s) Signed: 04/03/2021 11:13:46 AM By: Donavan Burnet EMT Signed: 04/03/2021 2:21:00 PM By: Worthy Keeler PA-C Entered By: Donavan Burnet on 04/02/2021 10:52:59 -------------------------------------------------------------------------------- HBO Safety Checklist Details Patient Name: Date of Service: [name] Hawthorne B. 04/02/2021 8:00 A M Medical Record Number: [id number] Patient Account Number: [id number] Date of Birth/Sex: Treating RN: 1950/11/26 (70 y.o. [name] Primary Care Lonita Debes: [name] Other Clinician: Donavan Burnet Referring Zillah Alexie: Treating Kenedy Haisley/Extender: Tally Joe in Treatment: 10 HBO Safety Checklist Items Safety Checklist Consent Form Signed Patient voided / foley secured and emptied When did you last eato 0700 Last dose of injectable or oral agent half dose metformin 0700 Ostomy pouch emptied and vented if applicable NA All implantable devices assessed, documented and approved NA Intravenous access site secured and place NA Valuables secured Linens and cotton and cotton/polyester blend (less than 51% polyester) Personal oil-based products / skin lotions / body lotions removed Wigs or hairpieces removed NA Smoking or tobacco materials removed NA Books / newspapers / magazines / loose paper removed Cologne, aftershave, perfume and deodorant removed Jewelry removed (may wrap wedding band) Make-up removed NA Hair care products removed NA Battery operated devices (external) removed Heating patches and chemical warmers removed Titanium eyewear removed Nail polish cured greater than 10 hours NA Casting material cured greater than 10 hours NA Hearing aids removed NA Loose dentures or partials removed NA Prosthetics have been removed NA Patient demonstrates correct use of air break device (if applicable) Patient concerns have been  addressed Patient grounding bracelet on and cord attached to chamber Specifics for Inpatients (complete in addition to above) Medication sheet sent with patient NA Intravenous medications needed or due during therapy sent with patient NA Drainage tubes (e.g. nasogastric tube or chest tube  secured and vented) NA Endotracheal or Tracheotomy tube secured NA Cuff deflated of air and inflated with saline NA Airway suctioned NA Notes Paper version of safety checklist was used prior to start of treatment. Data entered afterward. Electronic Signature(s) Signed: 04/03/2021 11:13:46 AM By: Donavan Burnet EMT Entered By: Donavan Burnet on 04/02/2021 08:57:30

## 2021-04-03 NOTE — Progress Notes (Signed)
GEOVANY, TRUDO ([id number]) Visit Report for 04/02/2021 Arrival Information Details Patient Name: Date of Service: [place], [place] 04/02/2021 8:00 A M Medical Record Number: [id number] Patient Account Number: [id number] Date of Birth/Sex: Treating RN: 08/19/1950 (70 y.o. Ulyses Amor, Vaughan Basta Primary Care Brandee Markin: [name] Other Clinician: Donavan Burnet Referring Zarai Orsborn: Treating Yamel Bale/Extender: Tally Joe in Treatment: 10 Visit Information History Since Last Visit All ordered tests and consults were completed: Yes Patient Arrived: Ambulatory Added or deleted any medications: No Arrival Time: 07:38 Any new allergies or adverse reactions: No Accompanied By: self Had a fall or experienced change in No Transfer Assistance: None activities of daily living that may affect Patient Identification Verified: Yes risk of falls: Secondary Verification Process Completed: Yes Signs or symptoms of abuse/neglect since last visito No Patient Requires Transmission-Based Precautions: No Hospitalized since last visit: No Patient Has Alerts: Yes Implantable device outside of the clinic excluding No cellular tissue based products placed in the center since last visit: Pain Present Now: No Electronic Signature(s) Signed: 04/03/2021 11:13:46 AM By: Donavan Burnet EMT Entered By: Donavan Burnet on 04/02/2021 08:55:29 -------------------------------------------------------------------------------- Encounter Discharge Information Details Patient Name: Date of Service: [name] 04/02/2021 8:00 A M Medical Record Number: [id number] Patient Account Number: [id number] Date of Birth/Sex: Treating RN: 10-17-1950 (70 y.o. [name] Primary Care Darlys Buis: [name] Other Clinician: Donavan Burnet Referring Byrdie Miyazaki: Treating Dillinger Aston/Extender: Tally Joe in Treatment: 10 Encounter Discharge Information  Items Discharge Condition: Stable Ambulatory Status: Ambulatory Discharge Destination: Other (Note Required) Transportation: Private Auto Accompanied By: self Schedule Follow-up Appointment: No Clinical Summary of Care: Notes Patient goes to work after treatment. Electronic Signature(s) Signed: 04/03/2021 11:13:46 AM By: Donavan Burnet EMT Entered By: Donavan Burnet on 04/02/2021 10:53:51 -------------------------------------------------------------------------------- Vitals Details Patient Name: Date of Service: [name] 04/02/2021 8:00 A M Medical Record Number: [id number] Patient Account Number: [id number] Date of Birth/Sex: Treating RN: 1951/03/12 (70 y.o. [name] Primary Care Clorine Swing: [name] Other Clinician: Donavan Burnet Referring Katana Berthold: Treating Jamir Rone/Extender: Tally Joe in Treatment: 10 Vital Signs Time Taken: 07:48 Temperature (F): 97.8 Height (in): 73 Pulse (bpm): 50 Weight (lbs): 222 Respiratory Rate (breaths/min): 12 Body Mass Index (BMI): 29.3 Blood Pressure (mmHg): 152/75 Capillary Blood Glucose (mg/dl): 102 Reference Range: 80 - 120 mg / dl Electronic Signature(s) Signed: 04/03/2021 11:13:46 AM By: Donavan Burnet EMT Entered By: Donavan Burnet on 04/02/2021 08:56:20

## 2021-04-04 DIAGNOSIS — L84 Corns and callosities: Secondary | ICD-10-CM | POA: Diagnosis not present

## 2021-04-04 DIAGNOSIS — L603 Nail dystrophy: Secondary | ICD-10-CM | POA: Diagnosis not present

## 2021-04-04 DIAGNOSIS — E1142 Type 2 diabetes mellitus with diabetic polyneuropathy: Secondary | ICD-10-CM | POA: Diagnosis not present

## 2021-04-08 DIAGNOSIS — H35033 Hypertensive retinopathy, bilateral: Secondary | ICD-10-CM | POA: Diagnosis not present

## 2021-04-11 DIAGNOSIS — I10 Essential (primary) hypertension: Secondary | ICD-10-CM | POA: Diagnosis not present

## 2021-04-11 DIAGNOSIS — I25119 Atherosclerotic heart disease of native coronary artery with unspecified angina pectoris: Secondary | ICD-10-CM | POA: Diagnosis not present

## 2021-04-11 DIAGNOSIS — E782 Mixed hyperlipidemia: Secondary | ICD-10-CM | POA: Diagnosis not present

## 2021-04-11 DIAGNOSIS — E119 Type 2 diabetes mellitus without complications: Secondary | ICD-10-CM | POA: Diagnosis not present

## 2021-04-13 ENCOUNTER — Other Ambulatory Visit: Payer: Self-pay | Admitting: "Endocrinology

## 2021-04-13 ENCOUNTER — Other Ambulatory Visit: Payer: Self-pay | Admitting: Nurse Practitioner

## 2021-04-13 DIAGNOSIS — N1831 Chronic kidney disease, stage 3a: Secondary | ICD-10-CM

## 2021-04-13 DIAGNOSIS — E1122 Type 2 diabetes mellitus with diabetic chronic kidney disease: Secondary | ICD-10-CM

## 2021-04-16 NOTE — Patient Instructions (Signed)

## 2021-04-17 ENCOUNTER — Encounter: Payer: Self-pay | Admitting: Nurse Practitioner

## 2021-04-17 ENCOUNTER — Ambulatory Visit (INDEPENDENT_AMBULATORY_CARE_PROVIDER_SITE_OTHER): Payer: Medicare HMO | Admitting: Nurse Practitioner

## 2021-04-17 ENCOUNTER — Other Ambulatory Visit: Payer: Self-pay

## 2021-04-17 VITALS — BP 131/68 | HR 54 | Ht 74.0 in | Wt 227.0 lb

## 2021-04-17 DIAGNOSIS — I10 Essential (primary) hypertension: Secondary | ICD-10-CM | POA: Diagnosis not present

## 2021-04-17 DIAGNOSIS — E1122 Type 2 diabetes mellitus with diabetic chronic kidney disease: Secondary | ICD-10-CM | POA: Diagnosis not present

## 2021-04-17 DIAGNOSIS — E782 Mixed hyperlipidemia: Secondary | ICD-10-CM | POA: Diagnosis not present

## 2021-04-17 DIAGNOSIS — N1831 Chronic kidney disease, stage 3a: Secondary | ICD-10-CM

## 2021-04-17 LAB — POCT GLYCOSYLATED HEMOGLOBIN (HGB A1C): HbA1c, POC (controlled diabetic range): 5.7 % (ref 0.0–7.0)

## 2021-04-17 NOTE — Progress Notes (Signed)
04/17/2021        Endocrinology follow-up note     Subjective:    Patient ID: [name] [name], male    DOB: Jun 05, 1951, PCP Lavella Lemons, PA   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-grade neoplasm)   Arthritis    Coronary artery disease    NSTEMI 102014; multivessel CAD s/p post CABG (LIMA-LAD, SVG-D1, SVG-D2, SVG-OM)   Essential hypertension    GERD (gastroesophageal reflux disease)    Gout    History of kidney stones    History of peptic ulcer disease    Hyperlipidemia    Left ureteral stone    Nodule of right lung    CT 04-04-2017  right lower lobe multiple nodules   OSA on CPAP    Renal cyst, right    CT 04-04-2017   Renal insufficiency    Type 2 diabetes mellitus ([place])    Wears glasses    Past Surgical History:  Procedure Laterality Date   CARDIOVASCULAR STRESS TEST  11-13-2016   dr Domenic Polite   Intermediate risk nuclear study w/ medium defect of moderate severity in the basal inferoseptal, basal inferior, mid inferoseptal and mid inferior location (findings consistant w/ prior myocardial infarction)/  mild enlarged LV cavity size;  nuclear stress EF 45%   COLONOSCOPY N/A 04/13/2017   Procedure: COLONOSCOPY;  Surgeon: Aviva Signs, MD;  [facility];  Service: Gastroenterology;  Laterality: N/A;   CORONARY ARTERY BYPASS GRAFT N/A 04/25/2013   Procedure: CORONARY ARTERY BYPASS GRAFTING (CABG);  Surgeon: [name], MD;  Location: [facility];  Service: Open Heart Surgery;  Laterality: N/A;  Coronary artery bypass graft times four, on pump, using left internal mammary artery and right greater saphenous vein via endovein harvest.   CYSTOSCOPY WITH RETROGRADE PYELOGRAM, URETEROSCOPY AND STENT PLACEMENT Left 04/08/2017   Procedure: CYSTOSCOPY WITH RETROGRADE PYELOGRAM, URETEROSCOPY,STENT PLACEMENT;  Surgeon: Lucas Mallow, MD;  Location: [facility];  Service: Urology;   Laterality: Left;   CYSTOSCOPY/URETEROSCOPY/HOLMIUM LASER/STENT PLACEMENT Left 05/17/2017   Procedure: CYSTOSCOPY / RETROGRADE PYELOGRAM / URETEROSCOPY / HOLMIUM LASER / STONE BASKETRY / STENT EXCHANGE;  Surgeon: Lucas Mallow, MD;  Location: [facility];  Service: Urology;  Laterality: Left;  ONLY NEEDS 45 MIN FOR PROCEDURE   ELBOW BURSA SURGERY Right 03-10-2017     at [facility]   and removal of spur   INTRAOPERATIVE TRANSESOPHAGEAL ECHOCARDIOGRAM N/A 04/25/2013   Procedure: INTRAOPERATIVE TRANSESOPHAGEAL ECHOCARDIOGRAM;  Surgeon: [name], MD;  Location: [facility];  Service: Open Heart Surgery;  Laterality: N/A;   KNEE ARTHROSCOPY Left 10/ 2017  approx.    dr Theda Sers   LAPAROSCOPIC RIGHT HEMI COLECTOMY Right 04/19/2017   Procedure: OPEN RIGHT HEMI COLECTOMY;  Surgeon: Virl Cagey, MD;  Location: [facility];  Service: General;  Laterality: Right;   LAPAROSCOPY N/A 04/19/2017   Procedure: LAPAROSCOPY DIAGNOSTIC;  Surgeon: Virl Cagey, MD;  Location: [facility];  Service: General;  Laterality: N/A;  patient knows to arrive at [place] N/A 04/20/2013   Procedure: [place];  Surgeon: [name], MD;  Location: [facility];  Service: Cardiovascular;  Laterality: N/A;   NASAL SEPTUM SURGERY  1990s approx   TONSILLECTOMY  39 (age 57)   TRANSTHORACIC ECHOCARDIOGRAM  11-17-2016   dr Domenic Polite  mild LVH,  ef 55-60%, LV wall motion & diastolic function indeterminant assessment,  images were inadequate/  mild LAE/  trivial Tr   TYMPANOSTOMY TUBE PLACEMENT     Social History   Socioeconomic History   Marital status: Married    Spouse name: Not on file   Number of children: Not on file   Years of education: Not on file   Highest education level: Not on file  Occupational History   Occupation: Drives truck    Employer: [employer]  Tobacco Use   Smoking status: Never    Smokeless tobacco: Never  Vaping Use   Vaping Use: Never used  Substance and Sexual Activity   Alcohol use: No    Alcohol/week: 0.0 standard drinks   Drug use: No   Sexual activity: Yes    Birth control/protection: None  Other Topics Concern   Not on file  Social History Narrative   Lives with wife, does not exercise but part of his job is strenuous. Works in a body-shop part-time, too.   2 of his mother's sisters and 1 brother died suddenly (family was told MI but no autopsy).    Social Determinants of Health   Financial Resource Strain: Not on file  Food Insecurity: Not on file  Transportation Needs: Not on file  Physical Activity: Not on file  Stress: Not on file  Social Connections: Not on file   Outpatient Encounter Medications as of 04/17/2021  Medication Sig   allopurinol (ZYLOPRIM) 300 MG tablet Take 300 mg by mouth daily.   Ascorbic Acid (VITAMIN C) 1000 MG tablet Take 1,000 mg by mouth daily.   aspirin EC 81 MG tablet Take 1 tablet (81 mg total) by mouth daily. Swallow whole.   Blood Glucose Monitoring Suppl (ONE TOUCH ULTRA 2) w/Device KIT USE TO TEST BLOOD ONCE A DAY   colchicine 0.6 MG tablet Take 0.6 mg by mouth daily.    escitalopram (LEXAPRO) 10 MG tablet Take 10 mg by mouth at bedtime.    glipiZIDE (GLUCOTROL XL) 5 MG 24 hr tablet TAKE 1 TABLET DAILY WITH   BREAKFAST   lisinopril (PRINIVIL,ZESTRIL) 10 MG tablet Take 5 mg by mouth every morning.    lovastatin (MEVACOR) 20 MG tablet TAKE 1 TABLET AT BEDTIME.   metFORMIN (GLUCOPHAGE) 500 MG tablet Take 1 tablet (500 mg total) by mouth daily.   metoprolol tartrate (LOPRESSOR) 25 MG tablet Take 0.5 tablets (12.5 mg total) by mouth 2 (two) times daily.   Multiple Vitamin (MULTIVITAMIN WITH MINERALS) TABS tablet Take 1 tablet by mouth daily.   Omega-3 Fatty Acids (FISH OIL) 1000 MG CPDR Take 1 capsule by mouth 2 (two) times a day.    omeprazole (PRILOSEC) 20 MG capsule Take 20 mg by mouth every morning.    ONETOUCH  ULTRA test strip USE ONCE DAILY   pyridoxine (B-6) 200 MG tablet Take 200 mg by mouth daily.   [DISCONTINUED] allopurinol (ZYLOPRIM) 100 MG tablet Take 100 mg by mouth daily. (Patient not taking: Reported on 04/17/2021)   [DISCONTINUED] ciprofloxacin (CIPRO) 250 MG tablet Take 500 mg by mouth 2 (two) times daily. (Patient not taking: Reported on 04/17/2021)   [DISCONTINUED] lisinopril (ZESTRIL) 5 MG tablet Take 5 mg by mouth daily. (Patient not taking: Reported on 04/17/2021)   [DISCONTINUED] lovastatin (MEVACOR) 20 MG tablet Take 1 tablet by mouth at bedtime. (Patient not taking: Reported on 04/17/2021)   No facility-administered encounter medications on file as of 04/17/2021.   ALLERGIES: No  Known Allergies VACCINATION STATUS: Immunization History  Administered Date(s) Administered   Influenza Split 02/10/2014   Influenza,inj,Quad PF,6+ Mos 04/20/2017    Diabetes He presents for his follow-up diabetic visit. He has type 2 diabetes mellitus. His disease course has been improving. There are no hypoglycemic associated symptoms. Pertinent negatives for hypoglycemia include no confusion, headaches, pallor or seizures. Associated symptoms include foot ulcerations. Pertinent negatives for diabetes include no chest pain, no fatigue, no polydipsia, no polyphagia, no polyuria and no weakness. There are no hypoglycemic complications. Symptoms are stable. Diabetic complications include heart disease and nephropathy. Risk factors for coronary artery disease include diabetes mellitus, dyslipidemia, hypertension, male sex and sedentary lifestyle. Current diabetic treatment includes oral agent (dual therapy). He is compliant with treatment most of the time. His weight is stable. He is following a generally healthy diet. When asked about meal planning, he reported none. He has not had a previous visit with a dietitian. He never participates in exercise. His home blood glucose trend is fluctuating minimally. His  breakfast blood glucose range is generally 90-110 mg/dl. His overall blood glucose range is 90-110 mg/dl. (He presents today with no meter or logs to review.  His POCT A1c today is 5.7%, improving from last visit of 6.4%.  He is still working with wound therapy for his diabetic foot ulcer- has been having treatments in the hyperbaric chamber which has helped save his foot.  He does report occasional symptoms of hypoglycemia.) An ACE inhibitor/angiotensin II receptor blocker is being taken. He does not see a podiatrist.Eye exam is current.  Hyperlipidemia This is a chronic problem. The current episode started more than 1 year ago. The problem is uncontrolled. Recent lipid tests were reviewed and are variable. Exacerbating diseases include chronic renal disease and diabetes. Factors aggravating his hyperlipidemia include beta blockers. Pertinent negatives include no chest pain, myalgias or shortness of breath. Current antihyperlipidemic treatment includes statins. The current treatment provides mild improvement of lipids. Compliance problems include adherence to diet and adherence to exercise.  Risk factors for coronary artery disease include diabetes mellitus, dyslipidemia, hypertension, male sex and a sedentary lifestyle.  Hypertension This is a chronic problem. The current episode started more than 1 year ago. The problem has been resolved since onset. The problem is controlled. Pertinent negatives include no chest pain, headaches, neck pain, palpitations or shortness of breath. There are no associated agents to hypertension. Risk factors for coronary artery disease include dyslipidemia, diabetes mellitus, male gender and sedentary lifestyle. Past treatments include beta blockers and ACE inhibitors. The current treatment provides mild improvement. There are no compliance problems.  Hypertensive end-organ damage includes kidney disease and CAD/MI. Identifiable causes of hypertension include chronic renal  disease.   Review of systems  Constitutional: + Minimally fluctuating body weight,  current Body mass index is 29.15 kg/m. , no fatigue, no subjective hyperthermia, no subjective hypothermia Eyes: no blurry vision, no xerophthalmia ENT: no sore throat, no nodules palpated in throat, no dysphagia/odynophagia, no hoarseness Cardiovascular: no chest pain, no shortness of breath, no palpitations, no leg swelling Respiratory: no cough, no shortness of breath Gastrointestinal: no nausea/vomiting/diarrhea Musculoskeletal: no muscle/joint aches Skin: no rashes, no hyperemia Neurological: no tremors, no numbness, no tingling, no dizziness Psychiatric: no depression, no anxiety    Objective:    BP 131/68   Pulse (!) 54   Ht $R'6\' 2"'qH$  (1.88 m)   Wt 227 lb (103 kg)   BMI 29.15 kg/m   Wt Readings from Last 3 Encounters:  04/17/21 227 lb (103 kg)  02/07/21 226 lb 12.8 oz (102.9 kg)  01/22/21 222 lb (100.7 kg)    BP Readings from Last 3 Encounters:  04/17/21 131/68  02/07/21 (!) 144/76  01/22/21 107/67     Physical Exam- Limited  Constitutional:  Body mass index is 29.15 kg/m. , not in acute distress, normal state of mind Eyes:  EOMI, no exophthalmos Neck: Supple Cardiovascular: RRR, no murmurs, rubs, or gallops, no edema Respiratory: Adequate breathing efforts, no crackles, rales, rhonchi, or wheezing Musculoskeletal: no gross deformities, strength intact in all four extremities, no gross restriction of joint movements Skin:  no rashes, no hyperemia Neurological: no tremor with outstretched hands   CMP     Component Value Date/Time   NA 142 11/20/2020 1017   K 4.5 11/20/2020 1017   CL 104 11/20/2020 1017   CO2 23 11/20/2020 1017   GLUCOSE 142 (H) 11/20/2020 1017   GLUCOSE 129 01/23/2020 1002   BUN 16 11/20/2020 1017   CREATININE 1.43 (H) 11/20/2020 1017   CREATININE 1.41 (H) 01/23/2020 1002   CALCIUM 9.6 11/20/2020 1017   PROT 6.6 11/20/2020 1017   ALBUMIN 4.4  11/20/2020 1017   AST 24 11/20/2020 1017   ALT 20 11/20/2020 1017   ALKPHOS 88 11/20/2020 1017   BILITOT 0.9 11/20/2020 1017   GFRNONAA 40 (L) 07/24/2020 0932   GFRNONAA 52 (L) 01/23/2020 1002   GFRAA 47 (L) 07/24/2020 0932   GFRAA 60 01/23/2020 1002     Diabetic Labs (most recent): Lab Results  Component Value Date   HGBA1C 5.7 04/17/2021   HGBA1C 6.4 (A) 12/16/2020   HGBA1C 7.4 (A) 07/31/2020     Lipid Panel ( most recent) Lipid Panel     Component Value Date/Time   CHOL 161 11/20/2020 1017   TRIG 191 (H) 11/20/2020 1017   HDL 43 11/20/2020 1017   CHOLHDL 3.7 11/20/2020 1017   CHOLHDL 4.0 09/04/2019 0916   VLDL 21 07/02/2016 0853   LDLCALC 86 11/20/2020 1017   LDLCALC 87 09/04/2019 0916     Assessment & Plan:   1) Type 2 diabetes mellitus with other circulatory complications , and acute on chronic renal insufficiency   His diabetes is complicated by coronary artery disease status post coronary artery bypass graft, acute on chronic renal insufficiency and patient remains at a high risk for more acute and chronic complications of diabetes which include CAD, CVA, CKD, retinopathy, and neuropathy. These are all discussed in detail with the patient.  He presents today with no meter or logs to review.  His POCT A1c today is 5.7%, improving from last visit of 6.4%.  He is still working with wound therapy for his diabetic foot ulcer- has been having treatments in the hyperbaric chamber which has helped save his foot.  He does report occasional symptoms of hypoglycemia.  - Recent labs reviewed.   - Nutritional counseling repeated at each appointment due to patients tendency to fall back in to old habits.  - The patient admits there is a room for improvement in their diet and drink choices. -  Suggestion is made for the patient to avoid simple carbohydrates from their diet including Cakes, Sweet Desserts / Pastries, Ice Cream, Soda (diet and regular), Sweet Tea, Candies,  Chips, Cookies, Sweet Pastries, Store Bought Juices, Alcohol in Excess of 1-2 drinks a day, Artificial Sweeteners, Coffee Creamer, and "Sugar-free" Products. This will help patient to have stable blood glucose profile and potentially avoid unintended weight gain.   -  I encouraged the patient to switch to unprocessed or minimally processed complex starch and increased protein intake (animal or plant source), fruits, and vegetables.   - Patient is advised to stick to a routine mealtimes to eat 3 meals a day and avoid unnecessary snacks (to snack only to correct hypoglycemia).  - I have approached patient with the following individualized plan to manage diabetes and patient agrees.  -Given his improved glycemic profile, and occasional hypoglycemia, we can further simplify his regimen by discontinuing his Glipizide for now.  He can continue his Metformin 500 mg po daily with breakfast.     -Due to monotherapy with Metformin alone, he can take a break from routine glucose monitoring.    2) BP/HTN:  His blood pressure is controlled to target.  He is advised to continue Lisinopril 5 mg po daily and Metoprolol 12.5 mg po BID.  3) Lipids/HPL:  His most recent lipid panel from 11/20/20 shows controlled LDL of 86 and elevated triglycerides of 191.  He is advised to continue Lovastatin 20 mg po daily at bedtime and fish oil supplement twice daily.  Encouraged him to avoid fried foods and butter.  4) Weight management:  His Body mass index is 29.15 kg/m.-a candidate for modest weight loss.  Detailed carbohydrates and exercise regimen was discussed with him.  5) Chronic Care/Health Maintenance: -Patient is on ACE and Statin medications and encouraged to continue to follow up with Ophthalmology, Podiatrist at least yearly or according to recommendations, and advised to  stay away from smoking. I have recommended yearly flu vaccine and pneumonia vaccination at least every 5 years; moderate intensity exercise  for up to 150 minutes weekly; and  sleep for at least 7 hours a day.   - I advised patient to maintain close follow up with Lavella Lemons, PA for primary care needs.       I spent 30 minutes in the care of the patient today including review of labs from [place], Lipids, Thyroid Function, Hematology (current and previous including abstractions from other facilities); face-to-face time discussing  his blood glucose readings/logs, discussing hypoglycemia and hyperglycemia episodes and symptoms, medications doses, his options of short and long term treatment based on the latest standards of care / guidelines;  discussion about incorporating lifestyle medicine;  and documenting the encounter.    Please refer to Patient Instructions for Blood Glucose Monitoring and Insulin/Medications Dosing Guide"  in media tab for additional information. Please  also refer to " Patient Self Inventory" in the Media  tab for reviewed elements of pertinent patient history.  [name] [name] participated in the discussions, expressed understanding, and voiced agreement with the above plans.  All questions were answered to his satisfaction. he is encouraged to contact clinic should he have any questions or concerns prior to his return visit.   Follow up plan: -Return in about 6 months (around 10/16/2021) for Diabetes F/U with A1c in office, Previsit labs, Bring meter and logs.    Rayetta Pigg, [facility] [address] [place], [place] [address] Phone: [phone] Fax: [phone]  04/17/2021, 8:58 AM

## 2021-05-15 DIAGNOSIS — E1142 Type 2 diabetes mellitus with diabetic polyneuropathy: Secondary | ICD-10-CM | POA: Diagnosis not present

## 2021-06-10 DIAGNOSIS — Z20828 Contact with and (suspected) exposure to other viral communicable diseases: Secondary | ICD-10-CM | POA: Diagnosis not present

## 2021-06-10 DIAGNOSIS — J209 Acute bronchitis, unspecified: Secondary | ICD-10-CM | POA: Diagnosis not present

## 2021-06-10 DIAGNOSIS — R059 Cough, unspecified: Secondary | ICD-10-CM | POA: Diagnosis not present

## 2021-06-11 DIAGNOSIS — I25119 Atherosclerotic heart disease of native coronary artery with unspecified angina pectoris: Secondary | ICD-10-CM | POA: Diagnosis not present

## 2021-06-11 DIAGNOSIS — E119 Type 2 diabetes mellitus without complications: Secondary | ICD-10-CM | POA: Diagnosis not present

## 2021-06-11 DIAGNOSIS — I10 Essential (primary) hypertension: Secondary | ICD-10-CM | POA: Diagnosis not present

## 2021-06-11 DIAGNOSIS — E782 Mixed hyperlipidemia: Secondary | ICD-10-CM | POA: Diagnosis not present

## 2021-06-13 DIAGNOSIS — L603 Nail dystrophy: Secondary | ICD-10-CM | POA: Diagnosis not present

## 2021-06-13 DIAGNOSIS — L84 Corns and callosities: Secondary | ICD-10-CM | POA: Diagnosis not present

## 2021-06-13 DIAGNOSIS — E1142 Type 2 diabetes mellitus with diabetic polyneuropathy: Secondary | ICD-10-CM | POA: Diagnosis not present

## 2021-06-30 ENCOUNTER — Other Ambulatory Visit: Payer: Self-pay | Admitting: "Endocrinology

## 2021-06-30 DIAGNOSIS — M7989 Other specified soft tissue disorders: Secondary | ICD-10-CM | POA: Diagnosis not present

## 2021-06-30 DIAGNOSIS — I25119 Atherosclerotic heart disease of native coronary artery with unspecified angina pectoris: Secondary | ICD-10-CM | POA: Diagnosis not present

## 2021-06-30 DIAGNOSIS — Z6828 Body mass index (BMI) 28.0-28.9, adult: Secondary | ICD-10-CM | POA: Diagnosis not present

## 2021-06-30 DIAGNOSIS — E1122 Type 2 diabetes mellitus with diabetic chronic kidney disease: Secondary | ICD-10-CM

## 2021-06-30 DIAGNOSIS — M79604 Pain in right leg: Secondary | ICD-10-CM | POA: Diagnosis not present

## 2021-06-30 DIAGNOSIS — E119 Type 2 diabetes mellitus without complications: Secondary | ICD-10-CM | POA: Diagnosis not present

## 2021-07-01 DIAGNOSIS — R2241 Localized swelling, mass and lump, right lower limb: Secondary | ICD-10-CM | POA: Diagnosis not present

## 2021-07-01 DIAGNOSIS — M79604 Pain in right leg: Secondary | ICD-10-CM | POA: Diagnosis not present

## 2021-07-01 DIAGNOSIS — R600 Localized edema: Secondary | ICD-10-CM | POA: Diagnosis not present

## 2021-07-13 ENCOUNTER — Other Ambulatory Visit: Payer: Self-pay | Admitting: Cardiology

## 2021-07-13 ENCOUNTER — Other Ambulatory Visit: Payer: Self-pay | Admitting: Nurse Practitioner

## 2021-07-13 DIAGNOSIS — E1122 Type 2 diabetes mellitus with diabetic chronic kidney disease: Secondary | ICD-10-CM

## 2021-07-23 DIAGNOSIS — B029 Zoster without complications: Secondary | ICD-10-CM | POA: Diagnosis not present

## 2021-07-23 DIAGNOSIS — Z6828 Body mass index (BMI) 28.0-28.9, adult: Secondary | ICD-10-CM | POA: Diagnosis not present

## 2021-08-21 DIAGNOSIS — L84 Corns and callosities: Secondary | ICD-10-CM | POA: Diagnosis not present

## 2021-08-21 DIAGNOSIS — E1142 Type 2 diabetes mellitus with diabetic polyneuropathy: Secondary | ICD-10-CM | POA: Diagnosis not present

## 2021-08-21 DIAGNOSIS — L603 Nail dystrophy: Secondary | ICD-10-CM | POA: Diagnosis not present

## 2021-08-21 DIAGNOSIS — M109 Gout, unspecified: Secondary | ICD-10-CM | POA: Diagnosis not present

## 2021-08-26 NOTE — Progress Notes (Signed)
Cardiology Office Note  Date: 08/27/2021   ID: [name] [name], DOB Oct 05, 1950, MRN [id number]  PCP:  [name] Lemons, PA  Cardiologist:  [name] Lesches, MD Electrophysiologist:  None   Chief Complaint  Patient presents with   Cardiac follow-up    History of Present Illness: [name] [name] is a 71 y.o. male last seen in February 2022.  He is here for a routine visit.  Reports no active angina symptoms, walking for exercise with NYHA class II dyspnea.  He reports no major change in stamina.  I personally reviewed his ECG today which shows sinus bradycardia with prolonged PR interval and nonspecific ST-T changes.  We reviewed his medications today, he reports compliance with therapy.  He is following with [name] [name] for management of type 2 diabetes mellitus, currently on Glucophage and Glucotrol XL.  Last hemoglobin A1c was 5.7%.  He will have follow-up lipids with his PCP later this year.  Last ischemic and structural cardiac testing was in 2018.  We discussed planning on repeat surveillance testing around the time of his next visit.  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-grade neoplasm)   Arthritis    Coronary artery disease    NSTEMI 947096; multivessel CAD s/p post CABG (LIMA-LAD, SVG-D1, SVG-D2, SVG-OM)   Essential hypertension    GERD (gastroesophageal reflux disease)    Gout    History of kidney stones    History of peptic ulcer disease    Hyperlipidemia    Left ureteral stone    Nodule of right lung    CT 04-04-2017  right lower lobe multiple nodules   OSA on CPAP    Renal cyst, right    CT 04-04-2017   Renal insufficiency    Type 2 diabetes mellitus ([employer])    Wears glasses     Past Surgical History:  Procedure Laterality Date   CARDIOVASCULAR STRESS TEST  11-13-2016   dr Domenic Polite   Intermediate risk nuclear study w/ medium defect of moderate severity in the basal inferoseptal, basal  inferior, mid inferoseptal and mid inferior location (findings consistant w/ prior myocardial infarction)/  mild enlarged LV cavity size;  nuclear stress EF 45%   COLONOSCOPY N/A 04/13/2017   Procedure: COLONOSCOPY;  Surgeon: Aviva Signs, MD;  [facility];  Service: Gastroenterology;  Laterality: N/A;   CORONARY ARTERY BYPASS GRAFT N/A 04/25/2013   Procedure: CORONARY ARTERY BYPASS GRAFTING (CABG);  Surgeon: [name], MD;  Location: [place];  Service: Open Heart Surgery;  Laterality: N/A;  Coronary artery bypass graft times four, on pump, using left internal mammary artery and right greater saphenous vein via endovein harvest.   CYSTOSCOPY WITH RETROGRADE PYELOGRAM, URETEROSCOPY AND STENT PLACEMENT Left 04/08/2017   Procedure: CYSTOSCOPY WITH RETROGRADE PYELOGRAM, URETEROSCOPY,STENT PLACEMENT;  Surgeon: Lucas Mallow, MD;  Location[facility];  Service: Urology;  Laterality: Left;   CYSTOSCOPY/URETEROSCOPY/HOLMIUM LASER/STENT PLACEMENT Left 05/17/2017   Procedure: CYSTOSCOPY / RETROGRADE PYELOGRAM / URETEROSCOPY / HOLMIUM LASER / STONE BASKETRY / STENT EXCHANGE;  Surgeon: Lucas Mallow, MD;  Location: [place];  Service: Urology;  Laterality: Left;  ONLY NEEDS 45 MIN FOR PROCEDURE   ELBOW BURSA SURGERY Right 03-10-2017     at [facility]   and removal of spur   INTRAOPERATIVE TRANSESOPHAGEAL ECHOCARDIOGRAM N/A 04/25/2013   Procedure: INTRAOPERATIVE TRANSESOPHAGEAL ECHOCARDIOGRAM;  Surgeon: [name],  MD;  [facility];  Service: Open Heart Surgery;  Laterality: N/A;   KNEE ARTHROSCOPY Left 10/ 2017  approx.    dr Theda Sers   LAPAROSCOPIC RIGHT HEMI COLECTOMY Right 04/19/2017   Procedure: OPEN RIGHT HEMI COLECTOMY;  Surgeon: Virl Cagey, MD;  Location: [facility];  Service: General;  Laterality: Right;   LAPAROSCOPY N/A 04/19/2017   Procedure: LAPAROSCOPY DIAGNOSTIC;  Surgeon: Virl Cagey, MD;  Location: [facility];  Service: General;   Laterality: N/A;  patient knows to arrive at [place] N/A 04/20/2013   Procedure: [place];  Surgeon: [name], MD;  Location: [facility];  Service: Cardiovascular;  Laterality: N/A;   NASAL SEPTUM SURGERY  1990s approx   TONSILLECTOMY  14 (age 10)   TRANSTHORACIC ECHOCARDIOGRAM  11-17-2016   dr Domenic Polite   mild LVH,  ef 55-60%, LV wall motion & diastolic function indeterminant assessment,  images were inadequate/  mild LAE/  trivial Tr   TYMPANOSTOMY TUBE PLACEMENT      Current Outpatient Medications  Medication Sig Dispense Refill   allopurinol (ZYLOPRIM) 300 MG tablet Take 300 mg by mouth daily.     Ascorbic Acid (VITAMIN C) 1000 MG tablet Take 1,000 mg by mouth daily.     aspirin EC 81 MG tablet Take 1 tablet (81 mg total) by mouth daily. Swallow whole. 90 tablet 3   Blood Glucose Monitoring Suppl (ONE TOUCH ULTRA 2) w/Device KIT USE TO TEST BLOOD ONCE A DAY 1 kit 0   escitalopram (LEXAPRO) 10 MG tablet Take 10 mg by mouth at bedtime.      glipiZIDE (GLUCOTROL XL) 5 MG 24 hr tablet TAKE 1 TABLET DAILY WITH   BREAKFAST 90 tablet 0   lisinopril (PRINIVIL,ZESTRIL) 10 MG tablet Take 5 mg by mouth every morning.      lovastatin (MEVACOR) 20 MG tablet TAKE 1 TABLET AT BEDTIME 90 tablet 3   metFORMIN (GLUCOPHAGE) 500 MG tablet Take 1 tablet (500 mg total) by mouth daily with breakfast. 90 tablet 1   metoprolol tartrate (LOPRESSOR) 25 MG tablet Take 0.5 tablets (12.5 mg total) by mouth 2 (two) times daily. 90 tablet 1   Multiple Vitamin (MULTIVITAMIN WITH MINERALS) TABS tablet Take 1 tablet by mouth daily.     Omega-3 Fatty Acids (FISH OIL) 1000 MG CPDR Take 1 capsule by mouth 2 (two) times a day.      omeprazole (PRILOSEC) 20 MG capsule Take 20 mg by mouth every morning.      ONETOUCH ULTRA test strip USE ONCE DAILY 100 strip 2   pyridoxine (B-6) 200 MG tablet Take 200 mg by mouth daily.      No current facility-administered medications for this visit.   Allergies:  Patient has no known allergies.   ROS: No palpitations or syncope.  Physical Exam: VS:  BP (!) 142/70    Pulse (!) 50    Ht '6\' 2"'  (1.88 m)    Wt 225 lb 12.8 oz (102.4 kg)    SpO2 94%    BMI 28.99 kg/m , BMI Body mass index is 28.99 kg/m.  Wt Readings from Last 3 Encounters:  08/27/21 225 lb 12.8 oz (102.4 kg)  04/17/21 227 lb (103 kg)  02/07/21 226 lb 12.8 oz (102.9 kg)    General: Patient appears comfortable at rest. HEENT: Conjunctiva and lids normal, wearing a mask. Neck: Supple, no elevated JVP or carotid bruits,  no thyromegaly. Lungs: Clear to auscultation, nonlabored breathing at rest. Cardiac: Distant, regular rate and rhythm, no S3 or significant systolic murmur, no pericardial rub. Extremities: No pitting edema.  ECG:  An ECG dated 08/27/2020 was personally reviewed today and demonstrated:  Sinus rhythm with prolonged PR interval, old inferior infarct pattern, low voltage in the precordial leads.  Recent Labwork: 11/20/2020: ALT 20; AST 24; BUN 16; Creatinine, Ser 1.43; Potassium 4.5; Sodium 142     Component Value Date/Time   CHOL 161 11/20/2020 1017   TRIG 191 (H) 11/20/2020 1017   HDL 43 11/20/2020 1017   CHOLHDL 3.7 11/20/2020 1017   CHOLHDL 4.0 09/04/2019 0916   VLDL 21 07/02/2016 0853   LDLCALC 86 11/20/2020 1017   LDLCALC 87 09/04/2019 0916    Other Studies Reviewed Today:  [facility] Myoview 11/13/2016: There was no ST segment deviation noted during stress. Defect 1: There is a medium defect of moderate severity present in the basal inferoseptal, basal inferior, mid inferoseptal and mid inferior location. Findings consistent with prior myocardial infarction. This is an intermediate risk study. Nuclear stress EF: 45%.   Echocardiogram 11/17/2016: Study Conclusions   - Left ventricle: The cavity size was normal. Wall thickness was   increased in a pattern of mild LVH. Systolic  function was normal.   The estimated ejection fraction was in the range of 55% to 60%. - Aortic valve: Mildly calcified annulus. Mildly thickened, mildly   calcified leaflets. Valve area (VTI): 3.22 cm^2. Valve area   (Vmax): 3.3 cm^2. - Left atrium: The atrium was mildly dilated. - Technically adequate study.  Assessment and Plan:  1.  Multivessel CAD status post CABG in 2014.  Last cardiac structural and ischemic evaluation was in 2018.  He remains angina free on medical therapy, ECG reviewed and stable.  We will plan on follow-up testing around the time of his next visit.  Continue aspirin, Mevacor, Lopressor, and lisinopril.  2.  Mixed hyperlipidemia, tolerating Mevacor.  He will have follow-up lab work with PCP later this year.  May need up titration of therapy based on updated lipid guidelines with goal LDL 55 or less.  3.  Type 2 diabetes mellitus, last hemoglobin A1c 5.7%.  He is following with Dr. Lum Babe.  Trying to lose weight.  He continues on Glucotrol XL and Glucophage.  SGLT2 inhibitor would be a consideration and might allow simplification of his current regimen.  Medication Adjustments/Labs and Tests Ordered: Current medicines are reviewed at length with the patient today.  Concerns regarding medicines are outlined above.   Tests Ordered: Orders Placed This Encounter  Procedures   EKG 12-Lead    Medication Changes: No orders of the defined types were placed in this encounter.   Disposition:  Follow up  1 year.  Signed, [name], MD, [facility] 08/27/2021 8:45 AM    [facility] [place], [place], [place] [address] Phone: [phone]; Fax: [phone]

## 2021-08-27 ENCOUNTER — Encounter: Payer: Self-pay | Admitting: Cardiology

## 2021-08-27 ENCOUNTER — Ambulatory Visit: Payer: Medicare HMO | Admitting: Cardiology

## 2021-08-27 VITALS — BP 142/70 | HR 50 | Ht 74.0 in | Wt 225.8 lb

## 2021-08-27 DIAGNOSIS — E119 Type 2 diabetes mellitus without complications: Secondary | ICD-10-CM | POA: Diagnosis not present

## 2021-08-27 DIAGNOSIS — E782 Mixed hyperlipidemia: Secondary | ICD-10-CM

## 2021-08-27 DIAGNOSIS — R001 Bradycardia, unspecified: Secondary | ICD-10-CM

## 2021-08-27 DIAGNOSIS — I25119 Atherosclerotic heart disease of native coronary artery with unspecified angina pectoris: Secondary | ICD-10-CM | POA: Diagnosis not present

## 2021-08-27 NOTE — Patient Instructions (Signed)

## 2021-09-17 ENCOUNTER — Other Ambulatory Visit: Payer: Self-pay | Admitting: Nurse Practitioner

## 2021-09-17 DIAGNOSIS — N1831 Chronic kidney disease, stage 3a: Secondary | ICD-10-CM

## 2021-09-17 DIAGNOSIS — E1122 Type 2 diabetes mellitus with diabetic chronic kidney disease: Secondary | ICD-10-CM

## 2021-10-13 DIAGNOSIS — E1122 Type 2 diabetes mellitus with diabetic chronic kidney disease: Secondary | ICD-10-CM | POA: Diagnosis not present

## 2021-10-13 DIAGNOSIS — N1831 Chronic kidney disease, stage 3a: Secondary | ICD-10-CM | POA: Diagnosis not present

## 2021-10-14 LAB — COMPREHENSIVE METABOLIC PANEL
ALT: 18 IU/L (ref 0–44)
AST: 21 IU/L (ref 0–40)
Albumin/Globulin Ratio: 1.9 (ref 1.2–2.2)
Albumin: 4 g/dL (ref 3.8–4.8)
Alkaline Phosphatase: 99 IU/L (ref 44–121)
BUN/Creatinine Ratio: 12 (ref 10–24)
BUN: 16 mg/dL (ref 8–27)
Bilirubin Total: 0.8 mg/dL (ref 0.0–1.2)
CO2: 22 mmol/L (ref 20–29)
Calcium: 9.6 mg/dL (ref 8.6–10.2)
Chloride: 107 mmol/L — ABNORMAL HIGH (ref 96–106)
Creatinine, Ser: 1.35 mg/dL — ABNORMAL HIGH (ref 0.76–1.27)
Globulin, Total: 2.1 g/dL (ref 1.5–4.5)
Glucose: 144 mg/dL — ABNORMAL HIGH (ref 70–99)
Potassium: 4.4 mmol/L (ref 3.5–5.2)
Sodium: 143 mmol/L (ref 134–144)
Total Protein: 6.1 g/dL (ref 6.0–8.5)
eGFR: 57 mL/min/{1.73_m2} — ABNORMAL LOW (ref >59)

## 2021-10-16 ENCOUNTER — Encounter: Payer: Self-pay | Admitting: Nurse Practitioner

## 2021-10-16 ENCOUNTER — Ambulatory Visit (INDEPENDENT_AMBULATORY_CARE_PROVIDER_SITE_OTHER): Payer: Medicare HMO | Admitting: Nurse Practitioner

## 2021-10-16 VITALS — BP 124/68 | HR 50 | Ht 74.0 in | Wt 216.0 lb

## 2021-10-16 DIAGNOSIS — N1831 Chronic kidney disease, stage 3a: Secondary | ICD-10-CM

## 2021-10-16 DIAGNOSIS — E119 Type 2 diabetes mellitus without complications: Secondary | ICD-10-CM | POA: Diagnosis not present

## 2021-10-16 DIAGNOSIS — I10 Essential (primary) hypertension: Secondary | ICD-10-CM | POA: Diagnosis not present

## 2021-10-16 DIAGNOSIS — E1122 Type 2 diabetes mellitus with diabetic chronic kidney disease: Secondary | ICD-10-CM | POA: Diagnosis not present

## 2021-10-16 DIAGNOSIS — E782 Mixed hyperlipidemia: Secondary | ICD-10-CM | POA: Diagnosis not present

## 2021-10-16 LAB — POCT GLYCOSYLATED HEMOGLOBIN (HGB A1C): HbA1c, POC (controlled diabetic range): 7.3 % — AB (ref 0.0–7.0)

## 2021-10-16 MED ORDER — ONETOUCH ULTRA VI STRP: ORAL_STRIP | 2 refills | Status: DC

## 2021-10-16 NOTE — Progress Notes (Signed)
?10/16/2021 ? ?      ?Endocrinology follow-up note ? ?  ? ?Subjective:  ? ? Patient ID: [name] [name], male    DOB: 05/18/1951, PCP Boyd, William S, PA ? ? ?Past Medical History:  ?Diagnosis Date  ? Anxiety   ? Appendiceal tumor   ? 2018 status post hemicolectomy (low-grade neoplasm)  ? Arthritis   ? Coronary artery disease   ? NSTEMI 102014; multivessel CAD s/p post CABG (LIMA-LAD, SVG-D1, SVG-D2, SVG-OM)  ? Essential hypertension   ? GERD (gastroesophageal reflux disease)   ? Gout   ? History of kidney stones   ? History of peptic ulcer disease   ? Hyperlipidemia   ? Left ureteral stone   ? Nodule of right lung   ? CT 04-04-2017  right lower lobe multiple nodules  ? OSA on CPAP   ? Renal cyst, right   ? CT 04-04-2017  ? Renal insufficiency   ? Type 2 diabetes mellitus ([facility])   ? Wears glasses   ? ?Past Surgical History:  ?Procedure Laterality Date  ? CARDIOVASCULAR STRESS TEST  11-13-2016   dr mcdowell  ? Intermediate risk nuclear study w/ medium defect of moderate severity in the basal inferoseptal, basal inferior, mid inferoseptal and mid inferior location (findings consistant w/ prior myocardial infarction)/  mild enlarged LV cavity size;  nuclear stress EF 45%  ? COLONOSCOPY N/A 04/13/2017  ? Procedure: COLONOSCOPY;  Surgeon: Jenkins, Mark, MD;  [facility];  Service: Gastroenterology;  Laterality: N/A;  ? CORONARY ARTERY BYPASS GRAFT N/A 04/25/2013  ? Procedure: CORONARY ARTERY BYPASS GRAFTING (CABG);  Surgeon: Bryan K Bartle, MD;  [facility];  Service: Open Heart Surgery;  Laterality: N/A;  Coronary artery bypass graft times four, on pump, using left internal mammary artery and right greater saphenous vein via endovein harvest.  ? CYSTOSCOPY WITH RETROGRADE PYELOGRAM, URETEROSCOPY AND STENT PLACEMENT Left 04/08/2017  ? Procedure: CYSTOSCOPY WITH RETROGRADE PYELOGRAM, URETEROSCOPY,STENT PLACEMENT;  Surgeon: Bell, Eugene D III, MD;  Location: [facility];  Service: Urology;   Laterality: Left;  ? CYSTOSCOPY/URETEROSCOPY/HOLMIUM LASER/STENT PLACEMENT Left 05/17/2017  ? Procedure: CYSTOSCOPY / RETROGRADE PYELOGRAM / URETEROSCOPY / HOLMIUM LASER / STONE BASKETRY / STENT EXCHANGE;  Surgeon: Bell, Eugene D III, MD;  Location: [facility];  Service: Urology;  Laterality: Left;  ONLY NEEDS 45 MIN FOR PROCEDURE  ? ELBOW BURSA SURGERY Right 03-10-2017     at [facility]  ? and removal of spur  ? INTRAOPERATIVE TRANSESOPHAGEAL ECHOCARDIOGRAM N/A 04/25/2013  ? Procedure: INTRAOPERATIVE TRANSESOPHAGEAL ECHOCARDIOGRAM;  Surgeon: Bryan K Bartle, MD;  [facility];  Service: Open Heart Surgery;  Laterality: N/A;  ? KNEE ARTHROSCOPY Left 10/ 2017  approx.    dr collins  ? LAPAROSCOPIC RIGHT HEMI COLECTOMY Right 04/19/2017  ? Procedure: OPEN RIGHT HEMI COLECTOMY;  Surgeon: Bridges, Lindsay C, MD;  [facility]: [facility];  Service: General;  Laterality: Right;  ? LAPAROSCOPY N/A 04/19/2017  ? Procedure: LAPAROSCOPY DIAGNOSTIC;  Surgeon: Bridges, Lindsay C, MD;  Location: [facility];  Service: General;  Laterality: N/A;  patient knows to arrive at 6:15  ? LEFT HEART CATHETERIZATION WITH CORONARY ANGIOGRAM N/A 04/20/2013  ? Procedure: LEFT HEART CATHETERIZATION WITH CORONARY ANGIOGRAM;  Surgeon: Michael D Cooper, MD;  [facility];  Service: Cardiovascular;  Laterality: N/A;  ? NASAL SEPTUM SURGERY  1990s approx  ? TONSILLECTOMY  1960 (age 8)  ? TRANSTHORACIC ECHOCARDIOGRAM  11-17-2016   dr mcdowell  ?   mild LVH,  ef 55-60%, LV wall motion & diastolic function indeterminant assessment,  images were inadequate/  mild LAE/  trivial Tr  ? TYMPANOSTOMY TUBE PLACEMENT    ? ?Social History  ? ?Socioeconomic History  ? Marital status: Married  ?  Spouse name: Not on file  ? Number of children: Not on file  ? Years of education: Not on file  ? Highest education level: Not on file  ?Occupational History  ? Occupation: Drives truck  ?  Employer: [employer]  ?Tobacco Use  ? Smoking status: Never  ?  Smokeless tobacco: Never  ?Vaping Use  ? Vaping Use: Never used  ?Substance and Sexual Activity  ? Alcohol use: No  ?  Alcohol/week: 0.0 standard drinks  ? Drug use: No  ? Sexual activity: Yes  ?  Birth control/protection: None  ?Other Topics Concern  ? Not on file  ?Social History Narrative  ? Lives with wife, does not exercise but part of his job is strenuous. Works in a body-shop part-time, too.  ? 2 of his mother's sisters and 1 brother died suddenly (family was told MI but no autopsy).   ? ?Social Determinants of Health  ? ?Financial Resource Strain: Not on file  ?Food Insecurity: Not on file  ?Transportation Needs: Not on file  ?Physical Activity: Not on file  ?Stress: Not on file  ?Social Connections: Not on file  ? ?Outpatient Encounter Medications as of 10/16/2021  ?Medication Sig  ? allopurinol (ZYLOPRIM) 300 MG tablet Take 300 mg by mouth daily.  ? Ascorbic Acid (VITAMIN C) 1000 MG tablet Take 1,000 mg by mouth daily.  ? aspirin EC 81 MG tablet Take 1 tablet (81 mg total) by mouth daily. Swallow whole.  ? Blood Glucose Monitoring Suppl (ONE TOUCH ULTRA 2) w/Device KIT USE TO TEST BLOOD ONCE A DAY  ? escitalopram (LEXAPRO) 10 MG tablet Take 10 mg by mouth at bedtime.   ? lisinopril (PRINIVIL,ZESTRIL) 10 MG tablet Take 5 mg by mouth every morning.   ? lovastatin (MEVACOR) 20 MG tablet TAKE 1 TABLET AT BEDTIME  ? metFORMIN (GLUCOPHAGE) 500 MG tablet TAKE 1 TABLET DAILY  ? metoprolol tartrate (LOPRESSOR) 25 MG tablet Take 0.5 tablets (12.5 mg total) by mouth 2 (two) times daily.  ? Multiple Vitamin (MULTIVITAMIN WITH MINERALS) TABS tablet Take 1 tablet by mouth daily.  ? Omega-3 Fatty Acids (FISH OIL) 1000 MG CPDR Take 1 capsule by mouth 2 (two) times a day.   ? omeprazole (PRILOSEC) 20 MG capsule Take 20 mg by mouth every morning.   ? pyridoxine (B-6) 200 MG tablet Take 200 mg by mouth daily.  ? valACYclovir (VALTREX) 1000 MG tablet Take 1,000 mg by mouth 3 (three) times daily.  ? [DISCONTINUED] ONETOUCH ULTRA  test strip USE ONCE DAILY  ? glucose blood (ONETOUCH ULTRA) test strip Use as instructed to monitor glucose once daily  ? [DISCONTINUED] glipiZIDE (GLUCOTROL XL) 5 MG 24 hr tablet TAKE 1 TABLET DAILY WITH   BREAKFAST (Patient not taking: Reported on 10/16/2021)  ? ?No facility-administered encounter medications on file as of 10/16/2021.  ? ?ALLERGIES: ?No Known Allergies ?VACCINATION STATUS: ?Immunization History  ?Administered Date(s) Administered  ? Influenza Split 02/10/2014  ? Influenza,inj,Quad PF,6+ Mos 04/20/2017  ? ? ?Diabetes ?He presents for his follow-up diabetic visit. He has type 2 diabetes mellitus. His disease course has been stable. There are no hypoglycemic associated symptoms. Pertinent negatives for hypoglycemia include no confusion, headaches, pallor or seizures. Associated  symptoms include foot ulcerations. Pertinent negatives for diabetes include no chest pain, no fatigue, no polydipsia, no polyphagia, no polyuria and no weakness. There are no hypoglycemic complications. Symptoms are resolved. Diabetic complications include heart disease and nephropathy. Risk factors for coronary artery disease include diabetes mellitus, dyslipidemia, hypertension, male sex and sedentary lifestyle. Current diabetic treatment includes oral agent (monotherapy). He is compliant with treatment all of the time. His weight is decreasing steadily. He is following a generally healthy diet. When asked about meal planning, he reported none. He has not had a previous visit with a dietitian. He never participates in exercise. (He presents today with no meter or logs, does not routinely monitor glucose due to monotherapy with Metformin only.  His POCT A1c today is 7.3%, increasing some from last visit of 5.7%.  He denies any hypoglycemia since the discontinuation of Glipizide.  He notes he was sick in between visits as well.  His foot ulcer has healed completely.  He is also working on diet and exercise, has been losing weight  and watching carb intake.) An ACE inhibitor/angiotensin II receptor blocker is being taken. He does not see a podiatrist.Eye exam is current.  ?Hyperlipidemia ?This is a chronic problem. The current episode

## 2021-10-16 NOTE — Patient Instructions (Signed)

## 2021-11-04 DIAGNOSIS — E1142 Type 2 diabetes mellitus with diabetic polyneuropathy: Secondary | ICD-10-CM | POA: Diagnosis not present

## 2021-11-04 DIAGNOSIS — L84 Corns and callosities: Secondary | ICD-10-CM | POA: Diagnosis not present

## 2021-11-04 DIAGNOSIS — L603 Nail dystrophy: Secondary | ICD-10-CM | POA: Diagnosis not present

## 2021-12-31 DIAGNOSIS — H35033 Hypertensive retinopathy, bilateral: Secondary | ICD-10-CM | POA: Diagnosis not present

## 2021-12-31 DIAGNOSIS — H02403 Unspecified ptosis of bilateral eyelids: Secondary | ICD-10-CM | POA: Diagnosis not present

## 2021-12-31 DIAGNOSIS — E119 Type 2 diabetes mellitus without complications: Secondary | ICD-10-CM | POA: Diagnosis not present

## 2021-12-31 DIAGNOSIS — I10 Essential (primary) hypertension: Secondary | ICD-10-CM | POA: Diagnosis not present

## 2021-12-31 DIAGNOSIS — H35032 Hypertensive retinopathy, left eye: Secondary | ICD-10-CM | POA: Diagnosis not present

## 2021-12-31 DIAGNOSIS — H3540 Unspecified peripheral retinal degeneration: Secondary | ICD-10-CM | POA: Diagnosis not present

## 2021-12-31 DIAGNOSIS — H35031 Hypertensive retinopathy, right eye: Secondary | ICD-10-CM | POA: Diagnosis not present

## 2022-01-15 DIAGNOSIS — E1142 Type 2 diabetes mellitus with diabetic polyneuropathy: Secondary | ICD-10-CM | POA: Diagnosis not present

## 2022-01-15 DIAGNOSIS — L603 Nail dystrophy: Secondary | ICD-10-CM | POA: Diagnosis not present

## 2022-01-15 DIAGNOSIS — L84 Corns and callosities: Secondary | ICD-10-CM | POA: Diagnosis not present

## 2022-02-16 ENCOUNTER — Encounter: Payer: Self-pay | Admitting: Nurse Practitioner

## 2022-02-16 ENCOUNTER — Ambulatory Visit: Payer: Medicare HMO | Admitting: Nurse Practitioner

## 2022-02-16 VITALS — BP 137/79 | HR 60 | Ht 74.0 in | Wt 225.0 lb

## 2022-02-16 DIAGNOSIS — E782 Mixed hyperlipidemia: Secondary | ICD-10-CM

## 2022-02-16 DIAGNOSIS — E1122 Type 2 diabetes mellitus with diabetic chronic kidney disease: Secondary | ICD-10-CM | POA: Diagnosis not present

## 2022-02-16 DIAGNOSIS — I10 Essential (primary) hypertension: Secondary | ICD-10-CM

## 2022-02-16 DIAGNOSIS — N1831 Chronic kidney disease, stage 3a: Secondary | ICD-10-CM | POA: Diagnosis not present

## 2022-02-16 LAB — POCT GLYCOSYLATED HEMOGLOBIN (HGB A1C): HbA1c POC (<> result, manual entry): 8.1 % (ref 4.0–5.6)

## 2022-02-16 LAB — POCT UA - MICROALBUMIN
Creatinine, POC: 300 mg/dL
Microalbumin Ur, POC: 80 mg/L

## 2022-02-16 MED ORDER — ONETOUCH ULTRA VI STRP: ORAL_STRIP | 2 refills | Status: DC

## 2022-02-16 MED ORDER — METFORMIN HCL 500 MG PO TABS: 500.0000 mg | ORAL_TABLET | Freq: Every day | ORAL | 1 refills | Status: DC

## 2022-02-16 NOTE — Progress Notes (Signed)
02/16/2022        Endocrinology follow-up note     Subjective:    Patient ID: [name] [name], male    DOB: 08-16-1950, PCP Lovey Newcomer, PA   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-grade neoplasm)   Arthritis    Coronary artery disease    NSTEMI 102014; multivessel CAD s/p post CABG (LIMA-LAD, SVG-D1, SVG-D2, SVG-OM)   Essential hypertension    GERD (gastroesophageal reflux disease)    Gout    History of kidney stones    History of peptic ulcer disease    Hyperlipidemia    Left ureteral stone    Nodule of right lung    CT 04-04-2017  right lower lobe multiple nodules   OSA on CPAP    Renal cyst, right    CT 04-04-2017   Renal insufficiency    Type 2 diabetes mellitus (HCC)    Wears glasses    Past Surgical History:  Procedure Laterality Date   CARDIOVASCULAR STRESS TEST  11-13-2016   dr Diona Browner   Intermediate risk nuclear study w/ medium defect of moderate severity in the basal inferoseptal, basal inferior, mid inferoseptal and mid inferior location (findings consistant w/ prior myocardial infarction)/  mild enlarged LV cavity size;  nuclear stress EF 45%   COLONOSCOPY N/A 04/13/2017   Procedure: COLONOSCOPY;  Surgeon: Franky Macho, MD;  [facility];  Service: Gastroenterology;  Laterality: N/A;   CORONARY ARTERY BYPASS GRAFT N/A 04/25/2013   Procedure: CORONARY ARTERY BYPASS GRAFTING (CABG);  Surgeon: Alleen Borne, MD;  Location: [facility] OR;  Service: Open Heart Surgery;  Laterality: N/A;  Coronary artery bypass graft times four, on pump, using left internal mammary artery and right greater saphenous vein via endovein harvest.   CYSTOSCOPY WITH RETROGRADE PYELOGRAM, URETEROSCOPY AND STENT PLACEMENT Left 04/08/2017   Procedure: CYSTOSCOPY WITH RETROGRADE PYELOGRAM, URETEROSCOPY,STENT PLACEMENT;  Surgeon: Crista Elliot, MD;  Location: [facility];  Service: Urology;   Laterality: Left;   CYSTOSCOPY/URETEROSCOPY/HOLMIUM LASER/STENT PLACEMENT Left 05/17/2017   Procedure: CYSTOSCOPY / RETROGRADE PYELOGRAM / URETEROSCOPY / HOLMIUM LASER / STONE BASKETRY / STENT EXCHANGE;  Surgeon: Crista Elliot, MD;  Location: [facility];  Service: Urology;  Laterality: Left;  ONLY NEEDS 45 MIN FOR PROCEDURE   ELBOW BURSA SURGERY Right 03-10-2017     at [facility]   and removal of spur   INTRAOPERATIVE TRANSESOPHAGEAL ECHOCARDIOGRAM N/A 04/25/2013   Procedure: INTRAOPERATIVE TRANSESOPHAGEAL ECHOCARDIOGRAM;  Surgeon: Alleen Borne, MD;  Location: [facility] OR;  Service: Open Heart Surgery;  Laterality: N/A;   KNEE ARTHROSCOPY Left 10/ 2017  approx.    dr Thomasena Edis   LAPAROSCOPIC RIGHT HEMI COLECTOMY Right 04/19/2017   Procedure: OPEN RIGHT HEMI COLECTOMY;  Surgeon: Lucretia Roers, MD;  Location: [facility];  Service: General;  Laterality: Right;   LAPAROSCOPY N/A 04/19/2017   Procedure: LAPAROSCOPY DIAGNOSTIC;  Surgeon: Lucretia Roers, MD;  Location: [facility];  Service: General;  Laterality: N/A;  patient knows to arrive at 6:15   LEFT HEART CATHETERIZATION WITH CORONARY ANGIOGRAM N/A 04/20/2013   Procedure: LEFT HEART CATHETERIZATION WITH CORONARY ANGIOGRAM;  Surgeon: Micheline Chapman, MD;  Location: [facility] CATH LAB;  Service: Cardiovascular;  Laterality: N/A;   NASAL SEPTUM SURGERY  1990s approx   TONSILLECTOMY  15 (age 40)   TRANSTHORACIC ECHOCARDIOGRAM  11-17-2016   dr Diona Browner  mild LVH,  ef 55-60%, LV wall motion & diastolic function indeterminant assessment,  images were inadequate/  mild LAE/  trivial Tr   TYMPANOSTOMY TUBE PLACEMENT     Social History   Socioeconomic History   Marital status: Married    Spouse name: Not on file   Number of children: Not on file   Years of education: Not on file   Highest education level: Not on file  Occupational History   Occupation: Drives truck    Employer: [employer]  Tobacco Use   Smoking status: Never    Smokeless tobacco: Never  Vaping Use   Vaping Use: Never used  Substance and Sexual Activity   Alcohol use: No    Alcohol/week: 0.0 standard drinks of alcohol   Drug use: No   Sexual activity: Yes    Birth control/protection: None  Other Topics Concern   Not on file  Social History Narrative   Lives with wife, does not exercise but part of his job is strenuous. Works in a body-shop part-time, too.   2 of his mother's sisters and 1 brother died suddenly (family was told MI but no autopsy).    Social Determinants of Health   Financial Resource Strain: Not on file  Food Insecurity: Not on file  Transportation Needs: Not on file  Physical Activity: Not on file  Stress: Not on file  Social Connections: Not on file   Outpatient Encounter Medications as of 02/16/2022  Medication Sig   allopurinol (ZYLOPRIM) 300 MG tablet Take 300 mg by mouth daily.   Ascorbic Acid (VITAMIN C) 1000 MG tablet Take 1,000 mg by mouth daily.   aspirin EC 81 MG tablet Take 1 tablet (81 mg total) by mouth daily. Swallow whole.   Blood Glucose Monitoring Suppl (ONE TOUCH ULTRA 2) w/Device KIT USE TO TEST BLOOD ONCE A DAY   escitalopram (LEXAPRO) 10 MG tablet Take 10 mg by mouth at bedtime.    glucose blood (ONETOUCH ULTRA) test strip Use as instructed to monitor glucose once daily   lisinopril (PRINIVIL,ZESTRIL) 10 MG tablet Take 5 mg by mouth every morning.    lovastatin (MEVACOR) 20 MG tablet TAKE 1 TABLET AT BEDTIME   metFORMIN (GLUCOPHAGE) 500 MG tablet Take 1 tablet (500 mg total) by mouth daily.   metoprolol tartrate (LOPRESSOR) 25 MG tablet Take 0.5 tablets (12.5 mg total) by mouth 2 (two) times daily.   Multiple Vitamin (MULTIVITAMIN WITH MINERALS) TABS tablet Take 1 tablet by mouth daily.   Omega-3 Fatty Acids (FISH OIL) 1000 MG CPDR Take 1 capsule by mouth 2 (two) times a day.    omeprazole (PRILOSEC) 20 MG capsule Take 20 mg by mouth every morning.    pyridoxine (B-6) 200 MG tablet Take 200 mg by  mouth daily.   valACYclovir (VALTREX) 1000 MG tablet Take 1,000 mg by mouth 3 (three) times daily.   [DISCONTINUED] glucose blood (ONETOUCH ULTRA) test strip Use as instructed to monitor glucose once daily   [DISCONTINUED] metFORMIN (GLUCOPHAGE) 500 MG tablet TAKE 1 TABLET DAILY   No facility-administered encounter medications on file as of 02/16/2022.   ALLERGIES: No Known Allergies VACCINATION STATUS: Immunization History  Administered Date(s) Administered   Influenza Split 02/10/2014   Influenza,inj,Quad PF,6+ Mos 04/20/2017    Diabetes He presents for his follow-up diabetic visit. He has type 2 diabetes mellitus. His disease course has been worsening. There are no hypoglycemic associated symptoms. Pertinent negatives for hypoglycemia include no confusion, headaches, pallor or seizures.  Pertinent negatives for diabetes include no chest pain, no fatigue, no foot ulcerations, no polydipsia, no polyphagia, no polyuria and no weakness. There are no hypoglycemic complications. Symptoms are resolved. Diabetic complications include heart disease and nephropathy. Risk factors for coronary artery disease include diabetes mellitus, dyslipidemia, hypertension, male sex and sedentary lifestyle. Current diabetic treatment includes oral agent (monotherapy). He is compliant with treatment all of the time. His weight is increasing steadily. He is following a generally healthy diet. When asked about meal planning, he reported none. He has not had a previous visit with a dietitian. He never participates in exercise. (He presents today with no meter or logs to review.  He was not asked to routinely monitor glucose due to monotherapy with Metformin alone.  His POCT A1c today is 8.1%, increasing from last visit of 7.3%.  He does note he went on vacation since last visit and did over eat and was also on oral prednisone for a while for gout flare.  ) An ACE inhibitor/angiotensin II receptor blocker is being taken. He  does not see a podiatrist.Eye exam is current.  Hyperlipidemia This is a chronic problem. The current episode started more than 1 year ago. The problem is uncontrolled. Recent lipid tests were reviewed and are variable. Exacerbating diseases include chronic renal disease and diabetes. Factors aggravating his hyperlipidemia include beta blockers. Pertinent negatives include no chest pain, myalgias or shortness of breath. Current antihyperlipidemic treatment includes statins. The current treatment provides mild improvement of lipids. Compliance problems include adherence to diet and adherence to exercise.  Risk factors for coronary artery disease include diabetes mellitus, dyslipidemia, hypertension, male sex and a sedentary lifestyle.  Hypertension This is a chronic problem. The current episode started more than 1 year ago. The problem has been resolved since onset. The problem is controlled. Pertinent negatives include no chest pain, headaches, neck pain, palpitations or shortness of breath. There are no associated agents to hypertension. Risk factors for coronary artery disease include dyslipidemia, diabetes mellitus, male gender and sedentary lifestyle. Past treatments include beta blockers and ACE inhibitors. The current treatment provides mild improvement. There are no compliance problems.  Hypertensive end-organ damage includes kidney disease and CAD/MI. Identifiable causes of hypertension include chronic renal disease.    Review of systems  Constitutional: + increasing body weight,  current Body mass index is 28.89 kg/m. , no fatigue, no subjective hyperthermia, no subjective hypothermia Eyes: no blurry vision, no xerophthalmia ENT: no sore throat, no nodules palpated in throat, no dysphagia/odynophagia, no hoarseness Cardiovascular: no chest pain, no shortness of breath, no palpitations, no leg swelling Respiratory: no cough, no shortness of breath Gastrointestinal: no  nausea/vomiting/diarrhea Musculoskeletal: no muscle/joint aches Skin: no rashes, no hyperemia Neurological: no tremors, + numbness/tingling to BLE, no dizziness Psychiatric: no depression, no anxiety    Objective:    BP 137/79   Pulse 60   Ht $R'6\' 2"'YN$  (1.88 m)   Wt 225 lb (102.1 kg)   BMI 28.89 kg/m   Wt Readings from Last 3 Encounters:  02/16/22 225 lb (102.1 kg)  10/16/21 216 lb (98 kg)  08/27/21 225 lb 12.8 oz (102.4 kg)    BP Readings from Last 3 Encounters:  02/16/22 137/79  10/16/21 124/68  08/27/21 (!) 142/70     Physical Exam- Limited  Constitutional:  Body mass index is 28.89 kg/m. , not in acute distress, normal state of mind Eyes:  EOMI, no exophthalmos Neck: Supple Cardiovascular: RRR, no murmurs, rubs, or gallops, no edema Respiratory:  Adequate breathing efforts, no crackles, rales, rhonchi, or wheezing Musculoskeletal: no gross deformities, strength intact in all four extremities, no gross restriction of joint movements Skin:  no rashes, no hyperemia Neurological: no tremor with outstretched hands  Diabetic Foot Exam - Simple   No data filed     CMP     Component Value Date/Time   NA 143 10/13/2021 0935   K 4.4 10/13/2021 0935   CL 107 (H) 10/13/2021 0935   CO2 22 10/13/2021 0935   GLUCOSE 144 (H) 10/13/2021 0935   GLUCOSE 129 01/23/2020 1002   BUN 16 10/13/2021 0935   CREATININE 1.35 (H) 10/13/2021 0935   CREATININE 1.41 (H) 01/23/2020 1002   CALCIUM 9.6 10/13/2021 0935   PROT 6.1 10/13/2021 0935   ALBUMIN 4.0 10/13/2021 0935   AST 21 10/13/2021 0935   ALT 18 10/13/2021 0935   ALKPHOS 99 10/13/2021 0935   BILITOT 0.8 10/13/2021 0935   GFRNONAA 40 (L) 07/24/2020 0932   GFRNONAA 52 (L) 01/23/2020 1002   GFRAA 47 (L) 07/24/2020 0932   GFRAA 60 01/23/2020 1002     Diabetic Labs (most recent): Lab Results  Component Value Date   HGBA1C 8.1 02/16/2022   HGBA1C 7.3 (A) 10/16/2021   HGBA1C 5.7 04/17/2021   MICROALBUR 80 02/16/2022    MICROALBUR 80 12/16/2020   MICROALBUR 7.3 09/09/2017     Lipid Panel ( most recent) Lipid Panel     Component Value Date/Time   CHOL 161 11/20/2020 1017   TRIG 191 (H) 11/20/2020 1017   HDL 43 11/20/2020 1017   CHOLHDL 3.7 11/20/2020 1017   CHOLHDL 4.0 09/04/2019 0916   VLDL 21 07/02/2016 0853   LDLCALC 86 11/20/2020 1017   LDLCALC 87 09/04/2019 0916     Assessment & Plan:   1) Type 2 diabetes mellitus with other circulatory complications , and acute on chronic renal insufficiency   His diabetes is complicated by coronary artery disease status post coronary artery bypass graft, acute on chronic renal insufficiency and patient remains at a high risk for more acute and chronic complications of diabetes which include CAD, CVA, CKD, retinopathy, and neuropathy. These are all discussed in detail with the patient.  He presents today with no meter or logs to review.  He was not asked to routinely monitor glucose due to monotherapy with Metformin alone.  His POCT A1c today is 8.1%, increasing from last visit of 7.3%.  He does note he went on vacation since last visit and did over eat and was also on oral prednisone for a while for gout flare.    - Recent labs reviewed.   - Nutritional counseling repeated at each appointment due to patients tendency to fall back in to old habits.  - The patient admits there is a room for improvement in their diet and drink choices. -  Suggestion is made for the patient to avoid simple carbohydrates from their diet including Cakes, Sweet Desserts / Pastries, Ice Cream, Soda (diet and regular), Sweet Tea, Candies, Chips, Cookies, Sweet Pastries, Store Bought Juices, Alcohol in Excess of 1-2 drinks a day, Artificial Sweeteners, Coffee Creamer, and "Sugar-free" Products. This will help patient to have stable blood glucose profile and potentially avoid unintended weight gain.   - I encouraged the patient to switch to unprocessed or minimally processed complex  starch and increased protein intake (animal or plant source), fruits, and vegetables.   - Patient is advised to stick to a routine mealtimes to eat 3 meals  a day and avoid unnecessary snacks (to snack only to correct hypoglycemia).  - I have approached patient with the following individualized plan to manage diabetes and patient agrees.  -He is advised to continue his Metformin 500 mg po daily with breakfast.   We discussed potentially adding back Glipizide but he wants to try being more strict with his diet first.  -I did encourage him to start monitoring glucose once daily to help him get back on track.   2) BP/HTN:  His blood pressure is controlled to target.  He is advised to continue Lisinopril 5 mg po daily and Metoprolol 12.5 mg po BID.  3) Lipids/HPL:  His most recent lipid panel from 11/20/20 shows controlled LDL of 86 and elevated triglycerides of 191.  He is advised to continue Lovastatin 20 mg po daily at bedtime and fish oil supplement twice daily.  Encouraged him to avoid fried foods and butter.  He has appt with his PCP coming up for a physical.  4) Weight management:  His Body mass index is 28.89 kg/m.-a candidate for modest weight loss.  Detailed carbohydrates and exercise regimen was discussed with him.  5) Chronic Care/Health Maintenance: -Patient is on ACE and Statin medications and encouraged to continue to follow up with Ophthalmology, Podiatrist at least yearly or according to recommendations, and advised to  stay away from smoking. I have recommended yearly flu vaccine and pneumonia vaccination at least every 5 years; moderate intensity exercise for up to 150 minutes weekly; and  sleep for at least 7 hours a day.   - I advised patient to maintain close follow up with Lavella Lemons, PA for primary care needs.     I spent 34 minutes in the care of the patient today including review of labs from [place], Lipids, Thyroid Function, Hematology (current and previous  including abstractions from other facilities); face-to-face time discussing  his blood glucose readings/logs, discussing hypoglycemia and hyperglycemia episodes and symptoms, medications doses, his options of short and long term treatment based on the latest standards of care / guidelines;  discussion about incorporating lifestyle medicine;  and documenting the encounter. Risk reduction counseling performed per USPSTF guidelines to reduce obesity and cardiovascular risk factors.     Please refer to Patient Instructions for Blood Glucose Monitoring and Insulin/Medications Dosing Guide"  in media tab for additional information. Please  also refer to " Patient Self Inventory" in the Media  tab for reviewed elements of pertinent patient history.  Donnald Garre participated in the discussions, expressed understanding, and voiced agreement with the above plans.  All questions were answered to his satisfaction. he is encouraged to contact clinic should he have any questions or concerns prior to his return visit.   Follow up plan: -Return in about 4 months (around 06/18/2022) for Diabetes F/U with A1c in office, No previsit labs, Bring meter and logs.    Rayetta Pigg, [facility] [address] [place], [place] [address] Phone: [phone] Fax: [phone]  02/16/2022, 8:57 AM

## 2022-02-18 ENCOUNTER — Telehealth: Payer: Self-pay | Admitting: Nurse Practitioner

## 2022-02-18 DIAGNOSIS — N1831 Chronic kidney disease, stage 3a: Secondary | ICD-10-CM

## 2022-02-18 MED ORDER — ONETOUCH ULTRA VI STRP: ORAL_STRIP | 2 refills | Status: DC

## 2022-02-18 MED ORDER — ONETOUCH ULTRASOFT LANCETS MISC: 12 refills | Status: AC

## 2022-02-18 MED ORDER — ONETOUCH ULTRA 2 W/DEVICE KIT
PACK | 0 refills | Status: AC
Start: 2022-02-18 — End: ?

## 2022-02-18 NOTE — Telephone Encounter (Signed)
I sent it in 

## 2022-02-18 NOTE — Telephone Encounter (Signed)
Patient's wife left a Vm stating that the meter and strips he has do not match and that is why he hardly checks his sugar. She is asking if you could send in One Touch Ultra 2 with the supplies- [facility] in [place]

## 2022-02-27 ENCOUNTER — Other Ambulatory Visit: Payer: Self-pay | Admitting: *Deleted

## 2022-02-27 NOTE — Patient Outreach (Signed)
  Care Coordination   02/27/2022 Name: [name] [name] MRN: [id number] DOB: [birth year]   Care Coordination Outreach Attempts:  An unsuccessful telephone outreach was attempted today to offer the patient information about available care coordination services as a benefit of their health plan.   Follow Up Plan:  Additional outreach attempts will be made to offer the patient care coordination information and services.   Encounter Outcome:  No Answer  Care Coordination Interventions Activated:  No   Care Coordination Interventions:  No, not indicated    Valente David, RN, MSN, [facility] Care Coordinator [phone]

## 2022-03-05 ENCOUNTER — Other Ambulatory Visit: Payer: Self-pay | Admitting: *Deleted

## 2022-03-05 ENCOUNTER — Encounter: Payer: Self-pay | Admitting: *Deleted

## 2022-03-05 NOTE — Patient Outreach (Signed)
  Care Coordination   Initial Visit Note   03/05/2022 Name: [name] [name] MRN: [id number] DOB: 07/21/1950  [name] [name] is a 71 y.o. year old male who sees Lavella Lemons, [place] for primary care. I spoke with  Donnald Garre by phone today  What matters to the patients health and wellness today?  A1C has increased to 8.1, working on improvement.  Wife and 2 daughters (one an Therapist, sports and the other a Health and PE teacher) are helping.  State AWV is next month.    Goals Addressed               This Visit's Progress     Decrease A1C (pt-stated)        Care Coordination Interventions: Provided education to patient about basic DM disease process Reviewed medications with patient and discussed importance of medication adherence Discussed plans with patient for ongoing care management follow up and provided patient with direct contact information for care management team Reviewed scheduled/upcoming provider appointments including: need to follow up with endocrinology for repeat A1C and PCP for AWV Assessed social determinant of health barriers        SDOH assessments and interventions completed:  Yes     Care Coordination Interventions Activated:  Yes  Care Coordination Interventions:  Yes, provided   Follow up plan: Follow up call scheduled for 10/18    Encounter Outcome:  Pt. Visit Completed   Valente David, RN, MSN, [place] Care Management Care Management Coordinator [phone]

## 2022-03-05 NOTE — Patient Instructions (Signed)
Visit Information  Thank you for taking time to visit with me today. Please don't hesitate to contact me if I can be of assistance to you before our next scheduled telephone appointment.  Following are the goals we discussed today:  Continue checking blood sugars.  Diet, and exercise are key!  Our next appointment is 10/18  Please call the Suicide and Crisis Lifeline: 988 call the [place] National Suicide Prevention Lifeline: 609-421-8517 or TTY: 540-303-4528 TTY (779) 405-4267) to talk to a trained counselor call 1-800-273-TALK (toll free, 24 hour hotline) call the [facility]: 308-181-9632 call 911 if you are experiencing a Mental Health or [place] or need someone to talk to.  Patient verbalizes understanding of instructions and care plan provided today and agrees to view in [place]. Active MyChart status and patient understanding of how to access instructions and care plan via MyChart confirmed with patient.     The patient has been provided with contact information for the care management team and has been advised to call with any health related questions or concerns.   Valente David, RN, MSN, [place] Care Management Care Management Coordinator [phone]

## 2022-03-05 NOTE — Patient Outreach (Signed)
  Care Coordination   03/05/2022 Name: [name] [name] MRN: [id number] DOB: 09-Apr-1951   Care Coordination Outreach Attempts:  A second unsuccessful outreach was attempted today to offer the patient with information about available care coordination services as a benefit of their health plan.     Follow Up Plan:  Additional outreach attempts will be made to offer the patient care coordination information and services.   Encounter Outcome:  No Answer  Care Coordination Interventions Activated:  No   Care Coordination Interventions:  No, not indicated    Valente David, RN, MSN, [facility] Care Management Care Management Coordinator [phone]

## 2022-04-06 DIAGNOSIS — H35033 Hypertensive retinopathy, bilateral: Secondary | ICD-10-CM | POA: Diagnosis not present

## 2022-04-06 DIAGNOSIS — H2513 Age-related nuclear cataract, bilateral: Secondary | ICD-10-CM | POA: Diagnosis not present

## 2022-04-06 DIAGNOSIS — H25013 Cortical age-related cataract, bilateral: Secondary | ICD-10-CM | POA: Diagnosis not present

## 2022-04-06 DIAGNOSIS — E119 Type 2 diabetes mellitus without complications: Secondary | ICD-10-CM | POA: Diagnosis not present

## 2022-04-06 DIAGNOSIS — H35363 Drusen (degenerative) of macula, bilateral: Secondary | ICD-10-CM | POA: Diagnosis not present

## 2022-04-06 DIAGNOSIS — H524 Presbyopia: Secondary | ICD-10-CM | POA: Diagnosis not present

## 2022-04-09 DIAGNOSIS — E782 Mixed hyperlipidemia: Secondary | ICD-10-CM | POA: Diagnosis not present

## 2022-04-09 DIAGNOSIS — I25119 Atherosclerotic heart disease of native coronary artery with unspecified angina pectoris: Secondary | ICD-10-CM | POA: Diagnosis not present

## 2022-04-09 DIAGNOSIS — E7800 Pure hypercholesterolemia, unspecified: Secondary | ICD-10-CM | POA: Diagnosis not present

## 2022-04-09 DIAGNOSIS — D126 Benign neoplasm of colon, unspecified: Secondary | ICD-10-CM | POA: Diagnosis not present

## 2022-04-09 DIAGNOSIS — N200 Calculus of kidney: Secondary | ICD-10-CM | POA: Diagnosis not present

## 2022-04-09 DIAGNOSIS — N400 Enlarged prostate without lower urinary tract symptoms: Secondary | ICD-10-CM | POA: Diagnosis not present

## 2022-04-09 DIAGNOSIS — Z951 Presence of aortocoronary bypass graft: Secondary | ICD-10-CM | POA: Diagnosis not present

## 2022-04-09 DIAGNOSIS — D373 Neoplasm of uncertain behavior of appendix: Secondary | ICD-10-CM | POA: Diagnosis not present

## 2022-04-09 DIAGNOSIS — Z0001 Encounter for general adult medical examination with abnormal findings: Secondary | ICD-10-CM | POA: Diagnosis not present

## 2022-04-09 DIAGNOSIS — E7849 Other hyperlipidemia: Secondary | ICD-10-CM | POA: Diagnosis not present

## 2022-04-09 DIAGNOSIS — I10 Essential (primary) hypertension: Secondary | ICD-10-CM | POA: Diagnosis not present

## 2022-04-09 DIAGNOSIS — M109 Gout, unspecified: Secondary | ICD-10-CM | POA: Diagnosis not present

## 2022-04-09 DIAGNOSIS — R69 Illness, unspecified: Secondary | ICD-10-CM | POA: Diagnosis not present

## 2022-04-09 DIAGNOSIS — G4733 Obstructive sleep apnea (adult) (pediatric): Secondary | ICD-10-CM | POA: Diagnosis not present

## 2022-04-09 DIAGNOSIS — Z23 Encounter for immunization: Secondary | ICD-10-CM | POA: Diagnosis not present

## 2022-04-09 DIAGNOSIS — E1169 Type 2 diabetes mellitus with other specified complication: Secondary | ICD-10-CM | POA: Diagnosis not present

## 2022-04-09 DIAGNOSIS — E1140 Type 2 diabetes mellitus with diabetic neuropathy, unspecified: Secondary | ICD-10-CM | POA: Diagnosis not present

## 2022-04-16 DIAGNOSIS — Z6829 Body mass index (BMI) 29.0-29.9, adult: Secondary | ICD-10-CM | POA: Diagnosis not present

## 2022-04-16 DIAGNOSIS — E1142 Type 2 diabetes mellitus with diabetic polyneuropathy: Secondary | ICD-10-CM | POA: Diagnosis not present

## 2022-04-16 DIAGNOSIS — E1169 Type 2 diabetes mellitus with other specified complication: Secondary | ICD-10-CM | POA: Diagnosis not present

## 2022-04-16 DIAGNOSIS — L84 Corns and callosities: Secondary | ICD-10-CM | POA: Diagnosis not present

## 2022-04-16 DIAGNOSIS — L603 Nail dystrophy: Secondary | ICD-10-CM | POA: Diagnosis not present

## 2022-04-16 DIAGNOSIS — I10 Essential (primary) hypertension: Secondary | ICD-10-CM | POA: Diagnosis not present

## 2022-04-16 DIAGNOSIS — M109 Gout, unspecified: Secondary | ICD-10-CM | POA: Diagnosis not present

## 2022-04-16 DIAGNOSIS — Z951 Presence of aortocoronary bypass graft: Secondary | ICD-10-CM | POA: Diagnosis not present

## 2022-04-29 ENCOUNTER — Encounter: Payer: Self-pay | Admitting: *Deleted

## 2022-04-29 ENCOUNTER — Ambulatory Visit: Payer: Self-pay | Admitting: *Deleted

## 2022-04-29 NOTE — Patient Outreach (Signed)
  Care Coordination   04/29/2022 Name: [name] [name] MRN: [id number] DOB: 11-25-1950   Care Coordination Outreach Attempts:  An unsuccessful telephone outreach was attempted for a scheduled appointment today.  Follow Up Plan:  Additional outreach attempts will be made to offer the patient care coordination information and services.   Encounter Outcome:  No Answer  Care Coordination Interventions Activated:  No   Care Coordination Interventions:  No, not indicated    Message sent to Scheduling Care Guide to outreach and reschedule telephone appointment with Valente David, RN Care Coordinator [phone])  Chong Sicilian, BSN, RN-BC RN Care Coordinator [place]: [phone] Main #: [phone]

## 2022-05-01 ENCOUNTER — Telehealth: Payer: Self-pay | Admitting: *Deleted

## 2022-05-01 NOTE — Chronic Care Management (AMB) (Signed)
  Care Coordination Note  05/01/2022 Name: [name] [name] MRN: [id number] DOB: [birth year]  [name] [name] is a 71 y.o. year old male who is a primary care patient of Lavella Lemons, [place] and is actively engaged with the care management team. I reached out to [name] [name] by phone today to assist with re-scheduling a follow up visit with the RN Case Manager  Follow up plan: Unsuccessful telephone outreach attempt made. A HIPAA compliant phone message was left for the patient providing contact information and requesting a return call.    [place]  Direct Dial: [phone]

## 2022-05-08 NOTE — Chronic Care Management (AMB) (Signed)
  Care Coordination Note  05/08/2022 Name: [name] [name] MRN: [id number] DOB: Aug 20, 1950  [name] [name] is a 71 y.o. year old male who is a primary care patient of Lavella Lemons, [place] and is actively engaged with the care management team. I reached out to Donnald Garre by phone today to assist with re-scheduling a follow up visit with the RN Case Manager  Follow up plan: Telephone appointment with care management team member scheduled for:05/11/22  [place]  Direct Dial: [phone]

## 2022-05-11 ENCOUNTER — Encounter: Payer: Self-pay | Admitting: *Deleted

## 2022-05-11 ENCOUNTER — Ambulatory Visit: Payer: Self-pay | Admitting: *Deleted

## 2022-05-11 NOTE — Patient Outreach (Signed)
  Care Coordination   Follow Up Visit Note   05/11/2022 Name: [name] [name] MRN: [id number] DOB: Nov 11, 1950  [name] [name] is a 71 y.o. year old male who sees Burdine, Virgina Evener, MD for primary care. I spoke with  Donnald Garre by phone today.  What matters to the patients health and wellness today? Insurance coverage of routine diabetic foot care and needs CPAP machine    Goals Addressed               This Visit's Progress     Patient Stated     Manage Diabetes & Prevent Complications (pt-stated)        Care Coordination Interventions: Reviewed medications with patient and discussed importance of medication adherence Counseled on importance of regular laboratory monitoring as prescribed Discussed plans with patient for ongoing care management follow up and provided patient with direct contact information for care management team Review of patient status, including review of consultants reports, relevant laboratory and other test results, and medications completed Assessed social determinant of health barriers Reviewed home blood sugar readings. Averaging 140-159 fasting.  Updated PCP in [place] Maintenance items Regular foot care with podiatrist in [place] every 10 weeks for foot care and nail trimming.  Insurance isn't covering the visit and it costs $60 Discussed that he also has calluses checked and filed down and not just a toenail trimming RNCC to check into coverage details Provided with [facility] contact number [phone] and encouraged to reach out as needed      Other     Manage Sleep Apnea        Care Coordination Interventions: Evaluation of current treatment plan related to sleep apnea and patient's adherence to plan as established by provider Patient was working with [facility] in [place] over a year ago about obtaining a new CPAP machine. That fell through the cracks.  Chart reviewed regarding most recent sleep study. Done at [facility] on 04/19/20.   Advised that study should be recent enough to qualify for Medicare coverage of CPAP PCP is managing sleep apnea. He does not see a neurologist or pulmonologist. Reached out to [facility] [phone] regarding CPAP order from last year Previous order was for a CPAP, Auto set for 8 to 14 Fax [phone] They are out of stock right now but should get more in next week. They're only able to get 5 per month.  Collaborated with Dr Burdine's office [phone] office regarding need for new order. Sleep study is valid.         SDOH assessments and interventions completed:  Yes  SDOH Interventions Today    Flowsheet Row Most Recent Value  SDOH Interventions   Housing Interventions Intervention Not Indicated  Transportation Interventions Intervention Not Indicated  Financial Strain Interventions Intervention Not Indicated        Care Coordination Interventions Activated:  Yes  Care Coordination Interventions:  Yes, provided   Follow up plan: Follow up call scheduled for 05/25/22    Encounter Outcome:  Pt. Visit Completed   Chong Sicilian, BSN, RN-BC RN Care Coordinator [place]: [phone] Main #: [phone]

## 2022-05-25 ENCOUNTER — Ambulatory Visit: Payer: Self-pay | Admitting: *Deleted

## 2022-05-25 NOTE — Patient Outreach (Signed)
  Care Coordination   Follow Up Visit Note   05/25/2022 Name: EMERSON SCHREIFELS MRN: [id number] DOB: 1950/12/05  MISTER KRAHENBUHL is a 71 y.o. year old male who sees Burdine, Virgina Evener, MD for primary care. I spoke with  Donnald Garre by phone today.  What matters to the patients health and wellness today?  Obtain CPAP and manage diabetic foot care    Goals Addressed               This Visit's Progress     Patient Stated     Manage Diabetes & Prevent Complications (pt-stated)        Care Coordination Interventions: Discussed plans with patient for ongoing care management follow up and provided patient with direct contact information for care management team Review of patient status, including review of consultants reports, relevant laboratory and other test results, and medications completed Assessed social determinant of health barriers Reviewed home blood sugar readings. Averaging 140-159 fasting.  Reviewed Health Maintenance items Regular foot care with podiatrist in [place] every 10 weeks for foot care and nail trimming.  Insurance isn't covering the visit and it costs $60 Discussed that he also has calluses checked and filed down and not just a toenail trimming RNCC researched Medicare coverage and advised patient that Medicare will cover the treatment of corns, calluses, and toenails once every 61 days in persons having certain systemic conditions, including diabetes with peripheral artery disease Advised patient to talk to podiatrist about coding the visit with this or a similar and appropriate diagnosis Provided with [facility] contact number [phone] and encouraged to reach out as needed      Other     Manage Sleep Apnea        Care Coordination Interventions: Evaluation of current treatment plan related to sleep apnea and patient's adherence to plan as established by provider Patient was working with [facility] in [place] over a year ago about obtaining a new  CPAP machine. That fell through the cracks.  Chart reviewed regarding most recent sleep study. Done at [facility] on 04/19/20.   Advised that study should be recent enough to qualify for Medicare coverage of CPAP PCP is managing sleep apnea. He does not see a neurologist or pulmonologist. Reached out to [facility] [phone] regarding CPAP order from last year Previous order was for a CPAP, Auto set for 8 to 14 Fax [phone] They're only able to stock 5 per month Discussed availability of CPAP machines with patient and need for new order from PCP.  Teague telephone note with details regarding CPAP order faxed securely through [facility] to Dr Burdine's office Encouraged patient to reach out to [facility] as needed        SDOH assessments and interventions completed:  No   Care Coordination Interventions Activated:  Yes  Care Coordination Interventions:  Yes, provided   Follow up plan: Follow up call scheduled for 06/24/22 with [facility]    Encounter Outcome:  Pt. Visit Completed   Chong Sicilian, BSN, RN-BC RN Care Coordinator [place]: [phone] Main #: [phone]

## 2022-06-08 IMAGING — CT CT ABD-PELV W/ CM
2 of 5 series · 15 of 46 positions shown, 17 images · IV contrast (Omnipaque or Isovue)
Comparison: CT chest abdomen and pelvis September 06, 2019.

CLINICAL DATA: History of mucinous neoplasm of the appendix status
post surgical resection CT abdomen pelvis September 06, 19

EXAM:
CT ABDOMEN AND PELVIS WITH CONTRAST
TECHNIQUE: Multidetector CT imaging of the abdomen and pelvis was performed
using the standard protocol following bolus administration of
intravenous contrast.
CONTRAST:  100mL OMNIPAQUE IOHEXOL 300 MG/ML  SOLN

[Series 2: axial st · axial · 0.79mm/px · z∈[+824,+1304]mm · 12 of 110 slices shown, 14 images]
[im 7/110  soft-tissue]
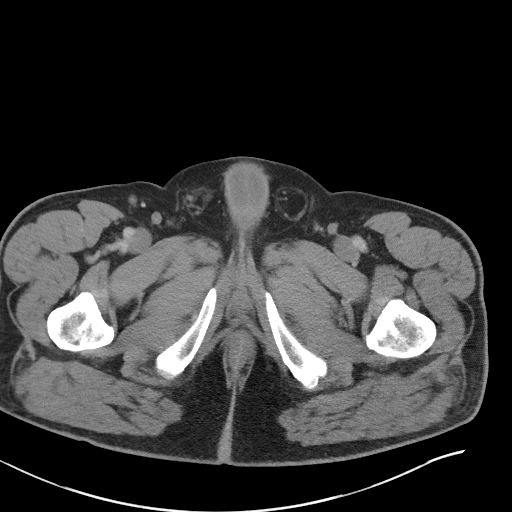
[im 7/110  bone]
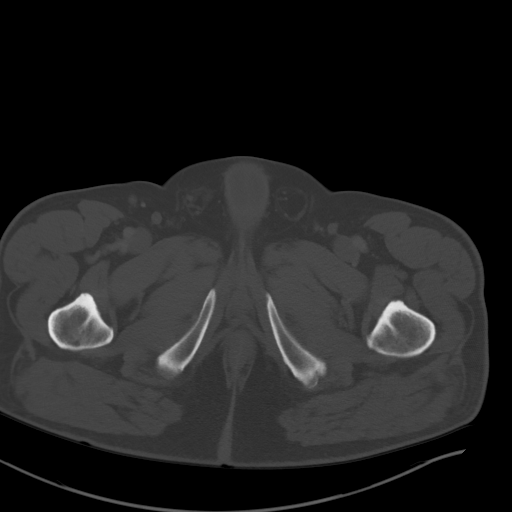
[im 20/110  soft-tissue]
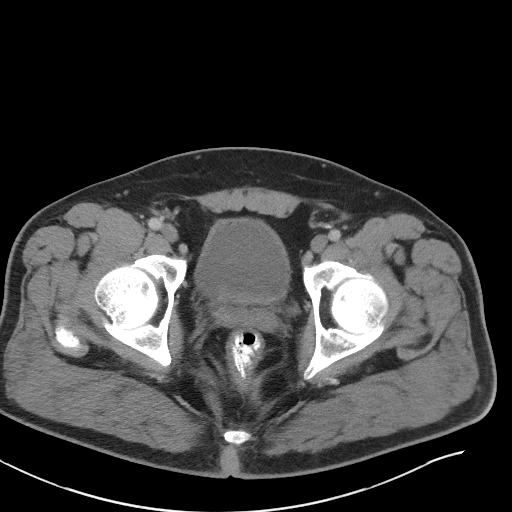
[im 26/110  soft-tissue]
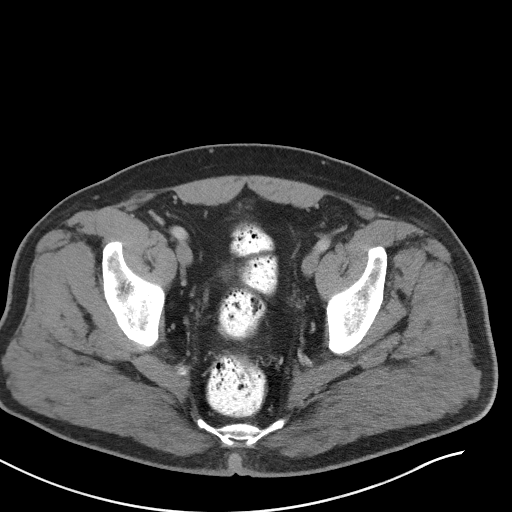
[im 33/110  soft-tissue]
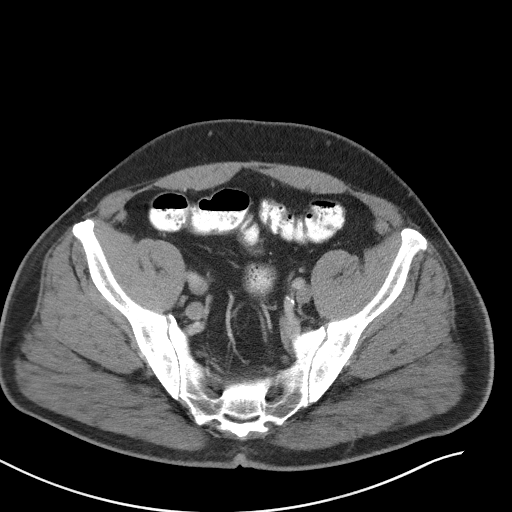
[im 45/110  soft-tissue]
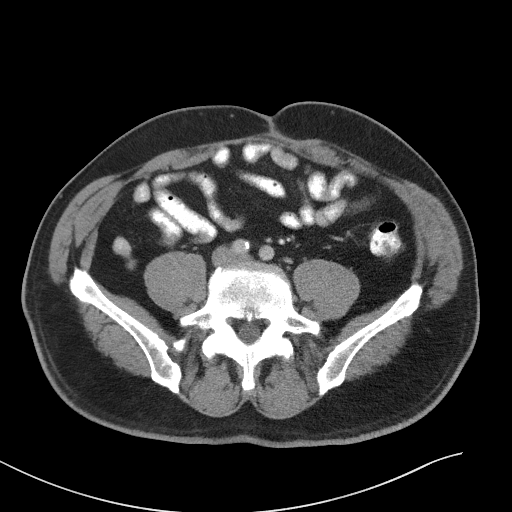
[im 52/110  soft-tissue]
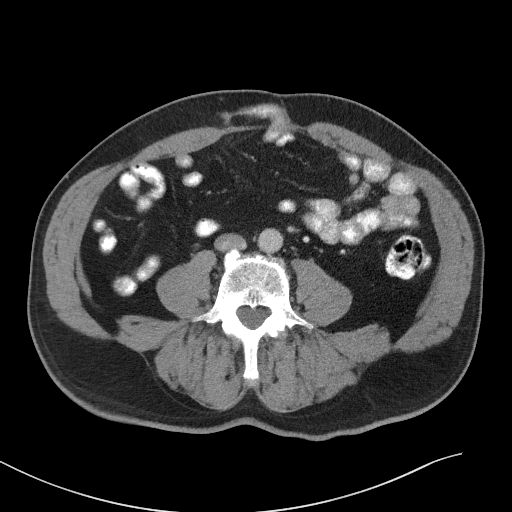
[im 58/110  soft-tissue]
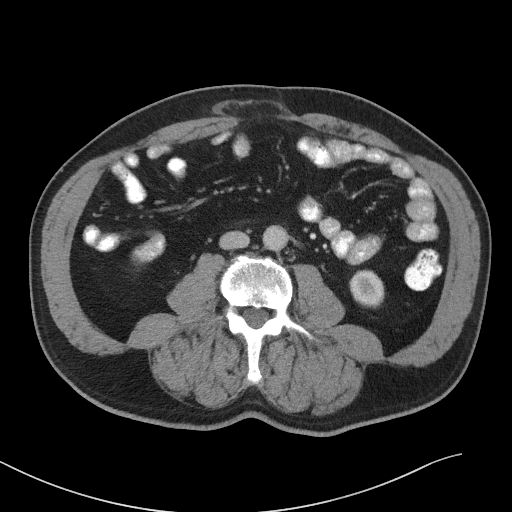
[im 71/110  soft-tissue]
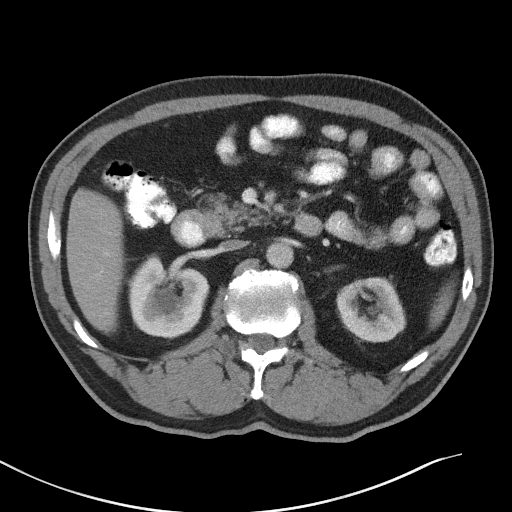
[im 77/110  soft-tissue]
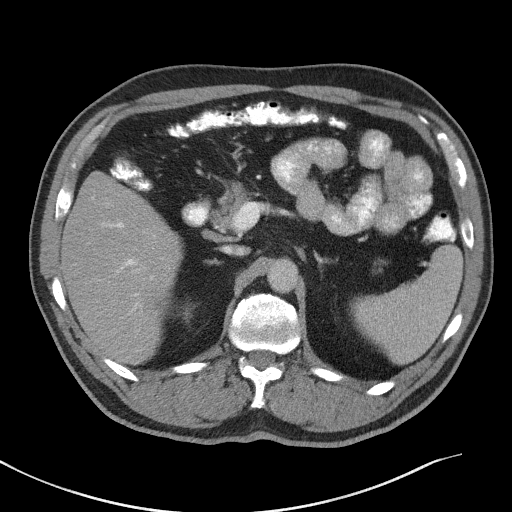
[im 77/110  bone]
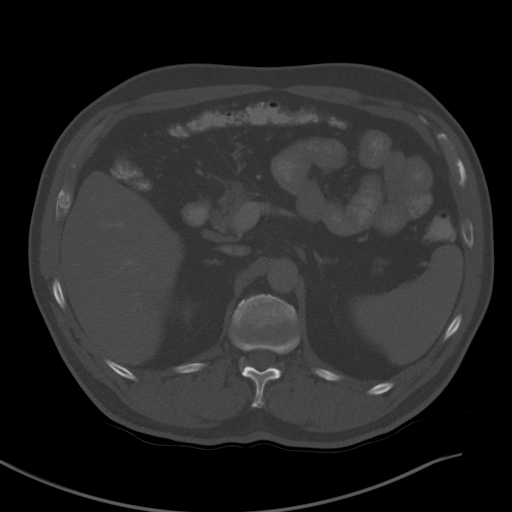
[im 84/110  soft-tissue]
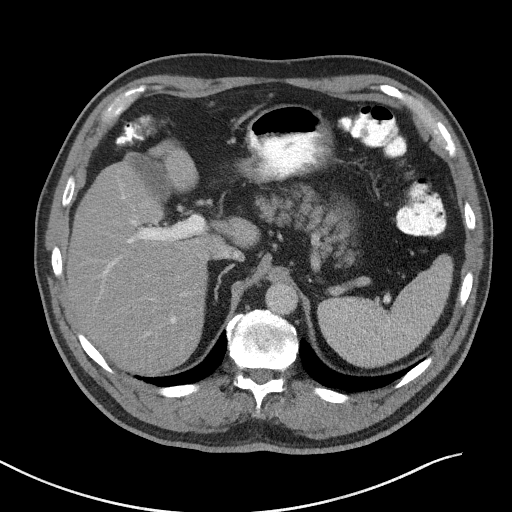
[im 97/110  soft-tissue]
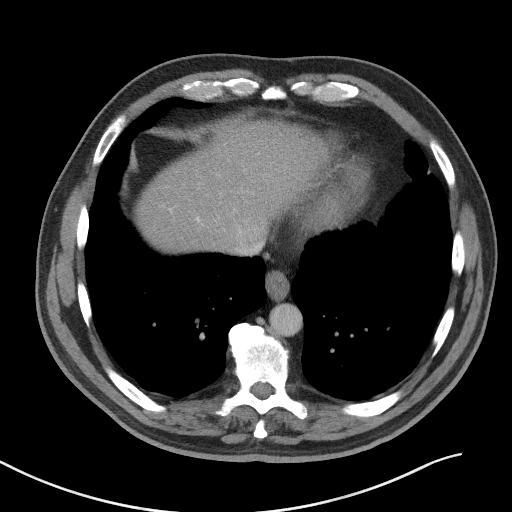
[im 103/110  soft-tissue]
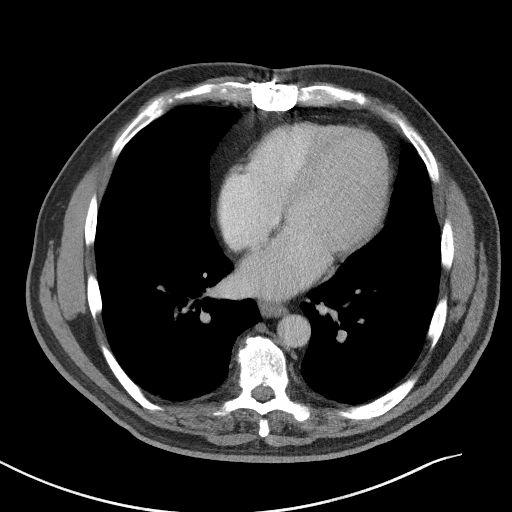

[Series 6: coronal st · coronal · 0.81mm/px · 3 of 117 slices shown]
[im 39/117  soft-tissue]
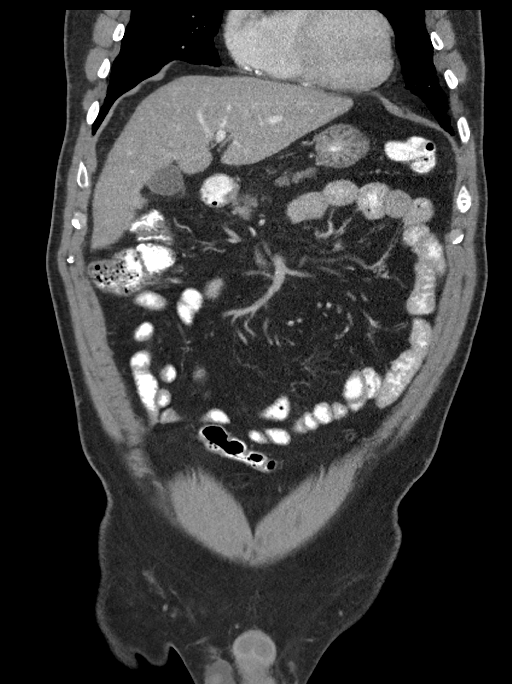
[im 52/117  soft-tissue]
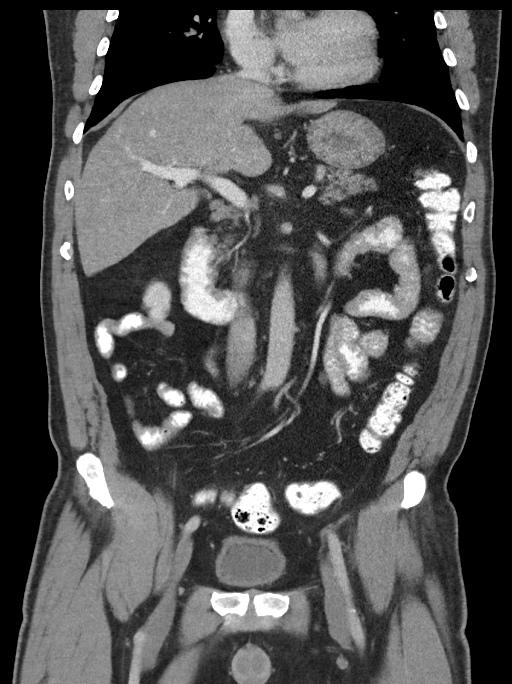
[im 65/117  soft-tissue]
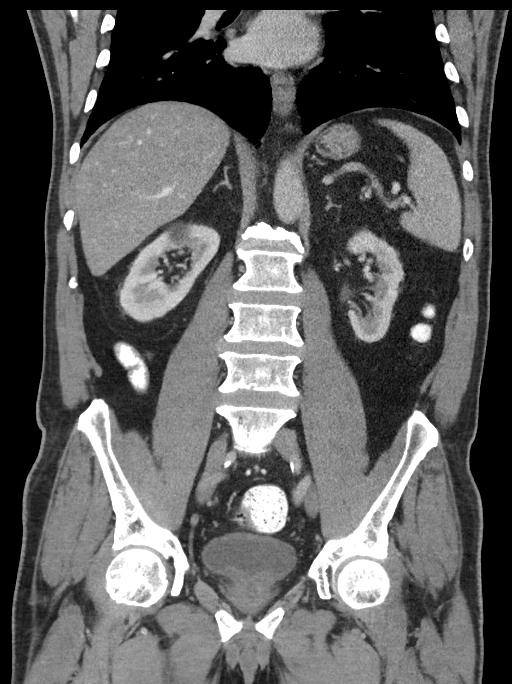

[15 of 46 positions shown; findings below may reference images not displayed]

FINDINGS: Lower chest: No significant change in size of the index and non
index pulmonary nodules. No new suspicious pulmonary nodules or mass
in the visualized lung fields. Index pulmonary nodules are as
follows:

-unchanged size of the 4 mm left lower lobe pulmonary nodule (series
5, image 18)

-unchanged size of the 3 mm right lower lobe pulmonary nodule
(series 5, image 16)

-unchanged size of the 10 mm nodule within the inferior aspect of
the right middle lobe (series 5, image 10).

No pneumothorax or pleural effusion.  Prior median sternotomy.

Hepatobiliary: No focal liver abnormality is seen. No gallstones,
gallbladder wall thickening, or biliary dilatation.

Pancreas: Unremarkable. No pancreatic ductal dilatation or
surrounding inflammatory changes.

Spleen: Normal in size without focal abnormality.

Adrenals/Urinary Tract: Adrenal glands are unremarkable. Kidneys are
normal, without renal calculi, focal suspicious lesion, or
hydronephrosis. Right renal cyst. Bladder is unremarkable.

Stomach/Bowel: Radiopaque enteric contrast visualized to the level
of the rectum. Stable postsurgical changes of hemicolectomy. Stomach
is within normal limits. No evidence of bowel wall thickening,
distention, or inflammatory changes.

Vascular/Lymphatic: Aortic atherosclerosis. No enlarged abdominal or
pelvic lymph nodes.

Reproductive: Prostate is unremarkable.

Other: No ascites.  Bilateral fat containing inguinal hernias.

Musculoskeletal: Multilevel degenerate change of the thoracolumbar
spine with flowing lower thoracic anterior osteophytes. No
suspicious lytic or blastic osseous lesions.
IMPRESSION: 1. No CT evidence of recurrent or metastatic disease within the
abdomen or pelvis.
2. Stable postsurgical changes of hemicolectomy.
3. Unchanged size of the index and non index pulmonary nodules
common visualized lungs. Which are favored to be benign given
long-term stability. Recommend attention on follow-up imaging.
4. Aortic atherosclerosis.

Aortic Atherosclerosis ([id number]).

## 2022-06-09 DIAGNOSIS — H2512 Age-related nuclear cataract, left eye: Secondary | ICD-10-CM | POA: Diagnosis not present

## 2022-06-09 DIAGNOSIS — H25812 Combined forms of age-related cataract, left eye: Secondary | ICD-10-CM | POA: Diagnosis not present

## 2022-06-18 ENCOUNTER — Ambulatory Visit: Payer: Medicare HMO | Admitting: Nurse Practitioner

## 2022-06-18 ENCOUNTER — Encounter: Payer: Self-pay | Admitting: Nurse Practitioner

## 2022-06-18 VITALS — BP 150/71 | HR 51 | Ht 74.0 in | Wt 223.4 lb

## 2022-06-18 DIAGNOSIS — I10 Essential (primary) hypertension: Secondary | ICD-10-CM | POA: Diagnosis not present

## 2022-06-18 DIAGNOSIS — N1831 Chronic kidney disease, stage 3a: Secondary | ICD-10-CM

## 2022-06-18 DIAGNOSIS — E1122 Type 2 diabetes mellitus with diabetic chronic kidney disease: Secondary | ICD-10-CM

## 2022-06-18 DIAGNOSIS — E782 Mixed hyperlipidemia: Secondary | ICD-10-CM

## 2022-06-18 LAB — POCT GLYCOSYLATED HEMOGLOBIN (HGB A1C): Hemoglobin A1C: 7.3 % — AB (ref 4.0–5.6)

## 2022-06-18 MED ORDER — METFORMIN HCL 500 MG PO TABS: 500.0000 mg | ORAL_TABLET | Freq: Every day | ORAL | 3 refills | Status: DC

## 2022-06-18 NOTE — Progress Notes (Signed)
06/18/2022        Endocrinology follow-up note     Subjective:    Patient ID: [name] [name], male    DOB: 07/11/1951, PCP Pleas Koch Virgina Evener, MD   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-grade neoplasm)   Arthritis    Coronary artery disease    NSTEMI 102014; multivessel CAD s/p post CABG (LIMA-LAD, SVG-D1, SVG-D2, SVG-OM)   Essential hypertension    GERD (gastroesophageal reflux disease)    Gout    History of kidney stones    History of peptic ulcer disease    Hyperlipidemia    Left ureteral stone    Nodule of right lung    CT 04-04-2017  right lower lobe multiple nodules   OSA on CPAP    Renal cyst, right    CT 04-04-2017   Renal insufficiency    Type 2 diabetes mellitus ([place])    Wears glasses    Past Surgical History:  Procedure Laterality Date   CARDIOVASCULAR STRESS TEST  11-13-2016   dr Domenic Polite   Intermediate risk nuclear study w/ medium defect of moderate severity in the basal inferoseptal, basal inferior, mid inferoseptal and mid inferior location (findings consistant w/ prior myocardial infarction)/  mild enlarged LV cavity size;  nuclear stress EF 45%   COLONOSCOPY N/A 04/13/2017   Procedure: COLONOSCOPY;  Surgeon: Aviva Signs, MD;  [facility];  Service: Gastroenterology;  Laterality: N/A;   CORONARY ARTERY BYPASS GRAFT N/A 04/25/2013   Procedure: CORONARY ARTERY BYPASS GRAFTING (CABG);  Surgeon: [name], MD;  Location: [place];  Service: Open Heart Surgery;  Laterality: N/A;  Coronary artery bypass graft times four, on pump, using left internal mammary artery and right greater saphenous vein via endovein harvest.   CYSTOSCOPY WITH RETROGRADE PYELOGRAM, URETEROSCOPY AND STENT PLACEMENT Left 04/08/2017   Procedure: CYSTOSCOPY WITH RETROGRADE PYELOGRAM, URETEROSCOPY,STENT PLACEMENT;  Surgeon: Lucas Mallow, MD;  Location: [facility];  Service: Urology;   Laterality: Left;   CYSTOSCOPY/URETEROSCOPY/HOLMIUM LASER/STENT PLACEMENT Left 05/17/2017   Procedure: CYSTOSCOPY / RETROGRADE PYELOGRAM / URETEROSCOPY / HOLMIUM LASER / STONE BASKETRY / STENT EXCHANGE;  Surgeon: Lucas Mallow, MD;  Location: [place];  Service: Urology;  Laterality: Left;  ONLY NEEDS 45 MIN FOR PROCEDURE   ELBOW BURSA SURGERY Right 03-10-2017     at [facility]   and removal of spur   INTRAOPERATIVE TRANSESOPHAGEAL ECHOCARDIOGRAM N/A 04/25/2013   Procedure: INTRAOPERATIVE TRANSESOPHAGEAL ECHOCARDIOGRAM;  Surgeon: [name], MD;  Location: [facility] OR;  Service: Open Heart Surgery;  Laterality: N/A;   KNEE ARTHROSCOPY Left 10/ 2017  approx.    dr Theda Sers   LAPAROSCOPIC RIGHT HEMI COLECTOMY Right 04/19/2017   Procedure: OPEN RIGHT HEMI COLECTOMY;  Surgeon: Virl Cagey, MD;  Location: [facility];  Service: General;  Laterality: Right;   LAPAROSCOPY N/A 04/19/2017   Procedure: LAPAROSCOPY DIAGNOSTIC;  Surgeon: Virl Cagey, MD;  Location: [facility];  Service: General;  Laterality: N/A;  patient knows to arrive at [place] N/A 04/20/2013   Procedure: [place];  Surgeon: [name], MD;  Location: [facility] CATH LAB;  Service: Cardiovascular;  Laterality: N/A;   NASAL SEPTUM SURGERY  1990s approx   TONSILLECTOMY  71 (age 22)   TRANSTHORACIC ECHOCARDIOGRAM  11-17-2016   dr Domenic Polite  mild LVH,  ef 55-60%, LV wall motion & diastolic function indeterminant assessment,  images were inadequate/  mild LAE/  trivial Tr   TYMPANOSTOMY TUBE PLACEMENT     Social History   Socioeconomic History   Marital status: Married    Spouse name: Not on file   Number of children: Not on file   Years of education: Not on file   Highest education level: Not on file  Occupational History   Occupation: Drives truck    Employer: [employer]  Tobacco Use   Smoking status: Never    Smokeless tobacco: Never  Vaping Use   Vaping Use: Never used  Substance and Sexual Activity   Alcohol use: No    Alcohol/week: 0.0 standard drinks of alcohol   Drug use: No   Sexual activity: Yes    Birth control/protection: None  Other Topics Concern   Not on file  Social History Narrative   Lives with wife, does not exercise but part of his job is strenuous. Works in a body-shop part-time, too.   2 of his mother's sisters and 1 brother died suddenly (family was told MI but no autopsy).    Social Determinants of Health   Financial Resource Strain: Low Risk  (05/11/2022)   Overall Financial Resource Strain (CARDIA)    Difficulty of Paying Living Expenses: Not hard at all  Food Insecurity: No Food Insecurity (03/05/2022)   Hunger Vital Sign    Worried About Running Out of Food in the Last Year: Never true    Ran Out of Food in the Last Year: Never true  Transportation Needs: No Transportation Needs (05/11/2022)   PRAPARE - Hydrologist (Medical): No    Lack of Transportation (Non-Medical): No  Physical Activity: Not on file  Stress: Not on file  Social Connections: Not on file   Outpatient Encounter Medications as of 06/18/2022  Medication Sig   allopurinol (ZYLOPRIM) 300 MG tablet Take 300 mg by mouth daily.   Ascorbic Acid (VITAMIN C) 1000 MG tablet Take 1,000 mg by mouth daily.   aspirin EC 81 MG tablet Take 1 tablet (81 mg total) by mouth daily. Swallow whole.   Blood Glucose Monitoring Suppl (ONE TOUCH ULTRA 2) w/Device KIT USE TO TEST BLOOD ONCE A DAY   escitalopram (LEXAPRO) 10 MG tablet Take 10 mg by mouth at bedtime.    glucose blood (ONETOUCH ULTRA) test strip Use as instructed to monitor glucose once daily   Lancets (ONETOUCH ULTRASOFT) lancets Use as instructed to monitor glucose once daily   lisinopril (PRINIVIL,ZESTRIL) 10 MG tablet Take 5 mg by mouth every morning.    lovastatin (MEVACOR) 20 MG tablet TAKE 1 TABLET AT BEDTIME    metoprolol tartrate (LOPRESSOR) 25 MG tablet Take 0.5 tablets (12.5 mg total) by mouth 2 (two) times daily.   Multiple Vitamin (MULTIVITAMIN WITH MINERALS) TABS tablet Take 1 tablet by mouth daily.   Omega-3 Fatty Acids (FISH OIL) 1000 MG CPDR Take 1 capsule by mouth 2 (two) times a day.    omeprazole (PRILOSEC) 20 MG capsule Take 20 mg by mouth every morning.    pyridoxine (B-6) 200 MG tablet Take 200 mg by mouth daily.   [DISCONTINUED] metFORMIN (GLUCOPHAGE) 500 MG tablet Take 1 tablet (500 mg total) by mouth daily.   metFORMIN (GLUCOPHAGE) 500 MG tablet Take 1 tablet (500 mg total) by mouth daily.   [DISCONTINUED] valACYclovir (VALTREX) 1000 MG tablet Take 1,000 mg by mouth 3 (  three) times daily.   No facility-administered encounter medications on file as of 06/18/2022.   ALLERGIES: No Known Allergies VACCINATION STATUS: Immunization History  Administered Date(s) Administered   Influenza Split 02/10/2014   Influenza,inj,Quad PF,6+ Mos 04/20/2017    Diabetes He presents for his follow-up diabetic visit. He has type 2 diabetes mellitus. His disease course has been improving. There are no hypoglycemic associated symptoms. Pertinent negatives for hypoglycemia include no confusion, headaches, pallor or seizures. Pertinent negatives for diabetes include no chest pain, no fatigue, no foot ulcerations, no polydipsia, no polyphagia, no polyuria and no weakness. There are no hypoglycemic complications. Symptoms are resolved. Diabetic complications include heart disease and nephropathy. Risk factors for coronary artery disease include diabetes mellitus, dyslipidemia, hypertension, male sex and sedentary lifestyle. Current diabetic treatment includes oral agent (monotherapy). He is compliant with treatment all of the time. His weight is fluctuating minimally. He is following a generally healthy diet. When asked about meal planning, he reported none. He has not had a previous visit with a dietitian. He  never participates in exercise. His home blood glucose trend is fluctuating minimally. His overall blood glucose range is 140-180 mg/dl. (He presents today with his logs, no meter, showing stable slightly above target fasting glycemic profile.  His POCT A1c today is 7.3%, improving from last visit of 8.1%.  He notes he has really worked hard on his diet, still eats what he wants but in smaller portions.  He denies any hypoglycemia.) An ACE inhibitor/angiotensin II receptor blocker is being taken. He does not see a podiatrist.Eye exam is current.  Hyperlipidemia This is a chronic problem. The current episode started more than 1 year ago. The problem is uncontrolled. Recent lipid tests were reviewed and are variable. Exacerbating diseases include chronic renal disease and diabetes. Factors aggravating his hyperlipidemia include beta blockers. Pertinent negatives include no chest pain, myalgias or shortness of breath. Current antihyperlipidemic treatment includes statins. The current treatment provides mild improvement of lipids. Compliance problems include adherence to diet and adherence to exercise.  Risk factors for coronary artery disease include diabetes mellitus, dyslipidemia, hypertension, male sex and a sedentary lifestyle.  Hypertension This is a chronic problem. The current episode started more than 1 year ago. The problem has been resolved since onset. The problem is controlled. Pertinent negatives include no chest pain, headaches, neck pain, palpitations or shortness of breath. There are no associated agents to hypertension. Risk factors for coronary artery disease include dyslipidemia, diabetes mellitus, male gender and sedentary lifestyle. Past treatments include beta blockers and ACE inhibitors. The current treatment provides mild improvement. There are no compliance problems.  Hypertensive end-organ damage includes kidney disease and CAD/MI. Identifiable causes of hypertension include chronic renal  disease.    Review of systems  Constitutional: + minimally fluctuating body weight,  current Body mass index is 28.68 kg/m. , no fatigue, no subjective hyperthermia, no subjective hypothermia Eyes: no blurry vision, no xerophthalmia ENT: no sore throat, no nodules palpated in throat, no dysphagia/odynophagia, no hoarseness Cardiovascular: no chest pain, no shortness of breath, no palpitations, no leg swelling Respiratory: no cough, no shortness of breath Gastrointestinal: no nausea/vomiting/diarrhea Musculoskeletal: no muscle/joint aches Skin: no rashes, no hyperemia Neurological: no tremors, + numbness/tingling to BLE, no dizziness Psychiatric: no depression, no anxiety    Objective:    BP (!) 150/71 (BP Location: Right Arm, Patient Position: Sitting, Cuff Size: Large)   Pulse (!) 51   Ht _0  (1.88 m)   Wt 223 lb 6.4  oz (101.3 kg)   BMI 28.68 kg/m   Wt Readings from Last 3 Encounters:  06/18/22 223 lb 6.4 oz (101.3 kg)  02/16/22 225 lb (102.1 kg)  10/16/21 216 lb (98 kg)    BP Readings from Last 3 Encounters:  06/18/22 (!) 150/71  02/16/22 137/79  10/16/21 124/68     Physical Exam- Limited  Constitutional:  Body mass index is 28.68 kg/m. , not in acute distress, normal state of mind Eyes:  EOMI, no exophthalmos Musculoskeletal: no gross deformities, strength intact in all four extremities, no gross restriction of joint movements Skin:  no rashes, no hyperemia Neurological: no tremor with outstretched hands   Diabetic Foot Exam - Simple   No data filed     CMP     Component Value Date/Time   NA 143 10/13/2021 0935   K 4.4 10/13/2021 0935   CL 107 (H) 10/13/2021 0935   CO2 22 10/13/2021 0935   GLUCOSE 144 (H) 10/13/2021 0935   GLUCOSE 129 01/23/2020 1002   BUN 16 10/13/2021 0935   CREATININE 1.35 (H) 10/13/2021 0935   CREATININE 1.41 (H) 01/23/2020 1002   CALCIUM 9.6 10/13/2021 0935   PROT 6.1 10/13/2021 0935   ALBUMIN 4.0 10/13/2021 0935   AST 21  10/13/2021 0935   ALT 18 10/13/2021 0935   ALKPHOS 99 10/13/2021 0935   BILITOT 0.8 10/13/2021 0935   GFRNONAA 40 (L) 07/24/2020 0932   GFRNONAA 52 (L) 01/23/2020 1002   GFRAA 47 (L) 07/24/2020 0932   GFRAA 60 01/23/2020 1002     Diabetic Labs (most recent): Lab Results  Component Value Date   HGBA1C 7.3 (A) 06/18/2022   HGBA1C 8.1 02/16/2022   HGBA1C 7.3 (A) 10/16/2021   MICROALBUR 80 02/16/2022   MICROALBUR 80 12/16/2020   MICROALBUR 7.3 09/09/2017     Lipid Panel ( most recent) Lipid Panel     Component Value Date/Time   CHOL 161 11/20/2020 1017   TRIG 191 (H) 11/20/2020 1017   HDL 43 11/20/2020 1017   CHOLHDL 3.7 11/20/2020 1017   CHOLHDL 4.0 09/04/2019 0916   VLDL 21 07/02/2016 0853   LDLCALC 86 11/20/2020 1017   LDLCALC 87 09/04/2019 0916     Assessment & Plan:   1) Type 2 diabetes mellitus with other circulatory complications , and acute on chronic renal insufficiency   His diabetes is complicated by coronary artery disease status post coronary artery bypass graft, acute on chronic renal insufficiency and patient remains at a high risk for more acute and chronic complications of diabetes which include CAD, CVA, CKD, retinopathy, and neuropathy. These are all discussed in detail with the patient.   He presents today with his logs, no meter, showing stable slightly above target fasting glycemic profile.  His POCT A1c today is 7.3%, improving from last visit of 8.1%.  He notes he has really worked hard on his diet, still eats what he wants but in smaller portions.  He denies any hypoglycemia.  - Recent labs reviewed.   - Nutritional counseling repeated at each appointment due to patients tendency to fall back in to old habits.  - The patient admits there is a room for improvement in their diet and drink choices. -  Suggestion is made for the patient to avoid simple carbohydrates from their diet including Cakes, Sweet Desserts / Pastries, Ice Cream, Soda (diet and  regular), Sweet Tea, Candies, Chips, Cookies, Sweet Pastries, Store Bought Juices, Alcohol in Excess of 1-2 drinks a day,  Artificial Sweeteners, Coffee Creamer, and "Sugar-free" Products. This will help patient to have stable blood glucose profile and potentially avoid unintended weight gain.   - I encouraged the patient to switch to unprocessed or minimally processed complex starch and increased protein intake (animal or plant source), fruits, and vegetables.   - Patient is advised to stick to a routine mealtimes to eat 3 meals a day and avoid unnecessary snacks (to snack only to correct hypoglycemia).  - I have approached patient with the following individualized plan to manage diabetes and patient agrees.  -He is advised to continue his Metformin 500 mg po daily with breakfast.   He does not need to restart Glipizide at this time.  -I did encourage him to continue monitoring glucose once daily to help keep him on track.   2) BP/HTN:  His blood pressure is NOT controlled to target but he had not long taken his medications prior to today's visit.  He is advised to continue Lisinopril 5 mg po daily and Metoprolol 12.5 mg po BID.  3) Lipids/HPL:  His most recent lipid panel from 11/20/20 shows controlled LDL of 86 and elevated triglycerides of 191.  He is advised to continue Lovastatin 20 mg po daily at bedtime and fish oil supplement twice daily.  Encouraged him to avoid fried foods and butter.  He has appt with his PCP coming up for a physical.  4) Weight management:  His Body mass index is 28.68 kg/m.-a candidate for modest weight loss.  Detailed carbohydrates and exercise regimen was discussed with him.  5) Chronic Care/Health Maintenance: -Patient is on ACE and Statin medications and encouraged to continue to follow up with Ophthalmology, Podiatrist at least yearly or according to recommendations, and advised to  stay away from smoking. I have recommended yearly flu vaccine and pneumonia  vaccination at least every 5 years; moderate intensity exercise for up to 150 minutes weekly; and  sleep for at least 7 hours a day.   - I advised patient to maintain close follow up with [place], Virgina Evener, MD for primary care needs.     I spent 39 minutes in the care of the patient today including review of labs from [place], Lipids, Thyroid Function, Hematology (current and previous including abstractions from other facilities); face-to-face time discussing  his blood glucose readings/logs, discussing hypoglycemia and hyperglycemia episodes and symptoms, medications doses, his options of short and long term treatment based on the latest standards of care / guidelines;  discussion about incorporating lifestyle medicine;  and documenting the encounter. Risk reduction counseling performed per USPSTF guidelines to reduce obesity and cardiovascular risk factors.     Please refer to Patient Instructions for Blood Glucose Monitoring and Insulin/Medications Dosing Guide"  in media tab for additional information. Please  also refer to " Patient Self Inventory" in the Media  tab for reviewed elements of pertinent patient history.  [name] [name] participated in the discussions, expressed understanding, and voiced agreement with the above plans.  All questions were answered to his satisfaction. he is encouraged to contact clinic should he have any questions or concerns prior to his return visit.   Follow up plan: -Return in about 4 months (around 10/18/2022) for Diabetes F/U with A1c in office, No previsit labs, Bring meter and logs.    Rayetta Pigg, [facility] [address] [place], [place] [address] Phone: [phone] Fax: [phone]  06/18/2022, 8:46 AM

## 2022-06-24 ENCOUNTER — Ambulatory Visit: Payer: Self-pay | Admitting: *Deleted

## 2022-06-24 NOTE — Patient Outreach (Signed)
  Care Coordination   06/24/2022 Name: [name] [name] MRN: [id number] DOB: May 24, 1951   Care Coordination Outreach Attempts:  An unsuccessful telephone outreach was attempted for a scheduled appointment today. Unsuccessful #2  Follow Up Plan:  Additional outreach attempts will be made to offer the patient care coordination information and services.   Encounter Outcome:  No Answer   Care Coordination Interventions:  No, not indicated    Chong Sicilian, BSN, RN-BC RN Care Coordinator [place]: [phone] Main #: [phone]

## 2022-06-26 DIAGNOSIS — K219 Gastro-esophageal reflux disease without esophagitis: Secondary | ICD-10-CM | POA: Diagnosis not present

## 2022-06-26 DIAGNOSIS — I252 Old myocardial infarction: Secondary | ICD-10-CM | POA: Diagnosis not present

## 2022-06-26 DIAGNOSIS — I25119 Atherosclerotic heart disease of native coronary artery with unspecified angina pectoris: Secondary | ICD-10-CM | POA: Diagnosis not present

## 2022-06-26 DIAGNOSIS — M109 Gout, unspecified: Secondary | ICD-10-CM | POA: Diagnosis not present

## 2022-06-26 DIAGNOSIS — Z7984 Long term (current) use of oral hypoglycemic drugs: Secondary | ICD-10-CM | POA: Diagnosis not present

## 2022-06-26 DIAGNOSIS — E1151 Type 2 diabetes mellitus with diabetic peripheral angiopathy without gangrene: Secondary | ICD-10-CM | POA: Diagnosis not present

## 2022-06-26 DIAGNOSIS — I10 Essential (primary) hypertension: Secondary | ICD-10-CM | POA: Diagnosis not present

## 2022-06-26 DIAGNOSIS — E785 Hyperlipidemia, unspecified: Secondary | ICD-10-CM | POA: Diagnosis not present

## 2022-06-26 DIAGNOSIS — N529 Male erectile dysfunction, unspecified: Secondary | ICD-10-CM | POA: Diagnosis not present

## 2022-06-26 DIAGNOSIS — R69 Illness, unspecified: Secondary | ICD-10-CM | POA: Diagnosis not present

## 2022-06-30 DIAGNOSIS — E1142 Type 2 diabetes mellitus with diabetic polyneuropathy: Secondary | ICD-10-CM | POA: Diagnosis not present

## 2022-06-30 DIAGNOSIS — L603 Nail dystrophy: Secondary | ICD-10-CM | POA: Diagnosis not present

## 2022-06-30 DIAGNOSIS — L84 Corns and callosities: Secondary | ICD-10-CM | POA: Diagnosis not present

## 2022-07-29 DIAGNOSIS — H35352 Cystoid macular degeneration, left eye: Secondary | ICD-10-CM | POA: Diagnosis not present

## 2022-07-29 DIAGNOSIS — H25811 Combined forms of age-related cataract, right eye: Secondary | ICD-10-CM | POA: Diagnosis not present

## 2022-07-31 ENCOUNTER — Other Ambulatory Visit: Payer: Self-pay | Admitting: Cardiology

## 2022-08-07 ENCOUNTER — Ambulatory Visit: Payer: Medicare HMO | Attending: Nurse Practitioner | Admitting: Nurse Practitioner

## 2022-08-07 ENCOUNTER — Encounter: Payer: Self-pay | Admitting: Nurse Practitioner

## 2022-08-07 ENCOUNTER — Encounter: Payer: Self-pay | Admitting: *Deleted

## 2022-08-07 VITALS — BP 126/60 | HR 66 | Ht 74.0 in | Wt 225.8 lb

## 2022-08-07 DIAGNOSIS — R5383 Other fatigue: Secondary | ICD-10-CM

## 2022-08-07 DIAGNOSIS — I2510 Atherosclerotic heart disease of native coronary artery without angina pectoris: Secondary | ICD-10-CM

## 2022-08-07 DIAGNOSIS — R0609 Other forms of dyspnea: Secondary | ICD-10-CM | POA: Diagnosis not present

## 2022-08-07 DIAGNOSIS — I491 Atrial premature depolarization: Secondary | ICD-10-CM

## 2022-08-07 DIAGNOSIS — E782 Mixed hyperlipidemia: Secondary | ICD-10-CM

## 2022-08-07 DIAGNOSIS — Z79899 Other long term (current) drug therapy: Secondary | ICD-10-CM | POA: Diagnosis not present

## 2022-08-07 DIAGNOSIS — I10 Essential (primary) hypertension: Secondary | ICD-10-CM

## 2022-08-07 DIAGNOSIS — G4733 Obstructive sleep apnea (adult) (pediatric): Secondary | ICD-10-CM | POA: Diagnosis not present

## 2022-08-07 DIAGNOSIS — R001 Bradycardia, unspecified: Secondary | ICD-10-CM | POA: Diagnosis not present

## 2022-08-07 DIAGNOSIS — R0602 Shortness of breath: Secondary | ICD-10-CM

## 2022-08-07 NOTE — Progress Notes (Unsigned)
Cardiology Office Note:    Date: 08/07/2022  ID:  [name] [name], DOB Dec 15, 1950, MRN [id number]  PCP:  Curlene Labrum, MD   [facility] Cardiologist:  [name], MD     Referring MD: Curlene Labrum, MD   CC: Here for follow-up  History of Present Illness:    [name] [name] is a 72 y.o. male with a hx of the following:  CAD, s/p CABG x 4 Mixed hyperlipidemia Hypertension Type 2 diabetes OSA on CPAP  Patient is a very pleasant 72 year old male with past medical history as mentioned above.  Last seen by Dr. Domenic Polite on August 27, 2021.  He was doing very well from a cardiac perspective at that time.  Was very active with walking.  Denied any chest pain.  Because he remained chest pain-free, Dr. Domenic Polite recommended planning on follow-up testing around time of next visit.  He recommended possibly starting SGLT2 inhibitor for management of his type 2 diabetes and to allow simplification of medication regimen.  No medication changes were made.  Was told to follow-up in 1 year.  Today he presents for 1 year follow-up.  He states he is doing well.  2 weeks ago he went for preoperative work for cataract surgery and heart rate was found to be 46 with a use of a pulse oximeter.  He has noticed some fatigue but denies any other cardiac concerns or complaints. Denies any chest pain, palpitations, syncope, presyncope, dizziness, orthopnea, PND, swelling or significant weight changes, acute bleeding, or claudication. Chronic, stable DOE. Compliant with his medications and tolerating well.  Denies any other questions or concerns today.  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-grade neoplasm)   Arthritis    Coronary artery disease    NSTEMI [id number]; multivessel CAD s/p post CABG (LIMA-LAD, SVG-D1, SVG-D2, SVG-OM)   Essential hypertension    GERD (gastroesophageal reflux disease)    Gout     History of kidney stones    History of peptic ulcer disease    Hyperlipidemia    Left ureteral stone    Nodule of right lung    CT 04-04-2017  right lower lobe multiple nodules   OSA on CPAP    Renal cyst, right    CT 04-04-2017   Renal insufficiency    Type 2 diabetes mellitus ([place])    Wears glasses     Past Surgical History:  Procedure Laterality Date   CARDIOVASCULAR STRESS TEST  11-13-2016   dr Domenic Polite   Intermediate risk nuclear study w/ medium defect of moderate severity in the basal inferoseptal, basal inferior, mid inferoseptal and mid inferior location (findings consistant w/ prior myocardial infarction)/  mild enlarged LV cavity size;  nuclear stress EF 45%   COLONOSCOPY N/A 04/13/2017   Procedure: COLONOSCOPY;  Surgeon: Aviva Signs, MD;  [facility];  Service: Gastroenterology;  Laterality: N/A;   CORONARY ARTERY BYPASS GRAFT N/A 04/25/2013   Procedure: CORONARY ARTERY BYPASS GRAFTING (CABG);  Surgeon: [name], MD;  Location: [place];  Service: Open Heart Surgery;  Laterality: N/A;  Coronary artery bypass graft times four, on pump, using left internal mammary artery and right greater saphenous vein via endovein harvest.   CYSTOSCOPY WITH RETROGRADE PYELOGRAM, URETEROSCOPY AND STENT PLACEMENT Left 04/08/2017   Procedure: CYSTOSCOPY WITH RETROGRADE PYELOGRAM, URETEROSCOPY,STENT PLACEMENT;  Surgeon: Lucas Mallow, MD;  Location: [facility];  Service: Urology;  Laterality: Left;   CYSTOSCOPY/URETEROSCOPY/HOLMIUM LASER/STENT PLACEMENT Left 05/17/2017   Procedure: CYSTOSCOPY / RETROGRADE PYELOGRAM / URETEROSCOPY / HOLMIUM LASER / STONE BASKETRY / STENT EXCHANGE;  Surgeon: Lucas Mallow, MD;  Location: [place];  Service: Urology;  Laterality: Left;  ONLY NEEDS 45 MIN FOR PROCEDURE   ELBOW BURSA SURGERY Right 03-10-2017     at [facility]   and removal of spur   INTRAOPERATIVE TRANSESOPHAGEAL ECHOCARDIOGRAM N/A 04/25/2013    Procedure: INTRAOPERATIVE TRANSESOPHAGEAL ECHOCARDIOGRAM;  Surgeon: [name], MD;  Location: [facility] OR;  Service: Open Heart Surgery;  Laterality: N/A;   KNEE ARTHROSCOPY Left 10/ 2017  approx.    dr Theda Sers   LAPAROSCOPIC RIGHT HEMI COLECTOMY Right 04/19/2017   Procedure: OPEN RIGHT HEMI COLECTOMY;  Surgeon: Virl Cagey, MD;  Location: [facility];  Service: General;  Laterality: Right;   LAPAROSCOPY N/A 04/19/2017   Procedure: LAPAROSCOPY DIAGNOSTIC;  Surgeon: Virl Cagey, MD;  Location: [facility];  Service: General;  Laterality: N/A;  patient knows to arrive at [place] N/A 04/20/2013   Procedure: [place];  Surgeon: [name], MD;  Location: [facility];  Service: Cardiovascular;  Laterality: N/A;   NASAL SEPTUM SURGERY  1990s approx   TONSILLECTOMY  18 (age 40)   TRANSTHORACIC ECHOCARDIOGRAM  11-17-2016   dr Domenic Polite   mild LVH,  ef 55-60%, LV wall motion & diastolic function indeterminant assessment,  images were inadequate/  mild LAE/  trivial Tr   TYMPANOSTOMY TUBE PLACEMENT      Current Medications: Current Meds  Medication Sig   allopurinol (ZYLOPRIM) 300 MG tablet Take 300 mg by mouth daily.   Ascorbic Acid (VITAMIN C) 1000 MG tablet Take 1,000 mg by mouth daily.   aspirin EC 81 MG tablet Take 1 tablet (81 mg total) by mouth daily. Swallow whole.   Blood Glucose Monitoring Suppl (ONE TOUCH ULTRA 2) w/Device KIT USE TO TEST BLOOD ONCE A DAY   escitalopram (LEXAPRO) 10 MG tablet Take 10 mg by mouth at bedtime.    glucose blood (ONETOUCH ULTRA) test strip Use as instructed to monitor glucose once daily   Lancets (ONETOUCH ULTRASOFT) lancets Use as instructed to monitor glucose once daily   lisinopril (PRINIVIL,ZESTRIL) 10 MG tablet Take 5 mg by mouth every morning.    lovastatin (MEVACOR) 20 MG tablet TAKE 1 TABLET AT BEDTIME   metFORMIN (GLUCOPHAGE) 500 MG tablet Take 1  tablet (500 mg total) by mouth daily.   metoprolol tartrate (LOPRESSOR) 25 MG tablet Take 0.5 tablets (12.5 mg total) by mouth 2 (two) times daily.   Multiple Vitamin (MULTIVITAMIN WITH MINERALS) TABS tablet Take 1 tablet by mouth daily.   Omega-3 Fatty Acids (FISH OIL) 1000 MG CPDR Take 1 capsule by mouth 2 (two) times a day.    omeprazole (PRILOSEC) 20 MG capsule Take 20 mg by mouth every morning.    pyridoxine (B-6) 200 MG tablet Take 200 mg by mouth daily.     Allergies:   Patient has no known allergies.   Social History   Socioeconomic History   Marital status: Married    Spouse name: Not on file   Number of children: Not on file   Years of education: Not on file   Highest education level: Not on file  Occupational History   Occupation: Drives truck    Employer: [employer]  Tobacco Use   Smoking status: Never   Smokeless tobacco: Never  Vaping Use   Vaping Use: Never used  Substance and Sexual Activity   Alcohol use: No    Alcohol/week: 0.0 standard drinks of alcohol   Drug use: No   Sexual activity: Yes    Birth control/protection: None  Other Topics Concern   Not on file  Social History Narrative   Lives with wife, does not exercise but part of his job is strenuous. Works in a body-shop part-time, too.   2 of his mother's sisters and 1 brother died suddenly (family was told MI but no autopsy).    Social Determinants of Health   Financial Resource Strain: Low Risk  (05/11/2022)   Overall Financial Resource Strain (CARDIA)    Difficulty of Paying Living Expenses: Not hard at all  Food Insecurity: No Food Insecurity (03/05/2022)   Hunger Vital Sign    Worried About Running Out of Food in the Last Year: Never true    Ran Out of Food in the Last Year: Never true  Transportation Needs: No Transportation Needs (05/11/2022)   PRAPARE - Hydrologist (Medical): No    Lack of Transportation (Non-Medical): No  Physical Activity: Not on  file  Stress: Not on file  Social Connections: Not on file     Family History: The patient's family history includes CAD in his father and mother; Colon cancer in his father; Lung cancer in his father.  ROS:   Review of Systems  Constitutional:  Positive for malaise/fatigue. Negative for chills, diaphoresis, fever and weight loss.  HENT: Negative.    Eyes: Negative.   Respiratory: Negative.    Cardiovascular: Negative.   Gastrointestinal: Negative.   Genitourinary: Negative.   Musculoskeletal: Negative.   Skin: Negative.   Neurological: Negative.   Endo/Heme/Allergies: Negative.   Psychiatric/Behavioral: Negative.      Please see the history of present illness.    All other systems reviewed and are negative.  EKGs/Labs/Other Studies Reviewed:    The following studies were reviewed today:   EKG:  EKG is ordered today.  The ekg ordered today demonstrates NSR, 66 bpm, first degree AV block, with PAC's, T wave abnormality (seen on previous EKG), no acute ischemic changes.    Echocardiogram on Nov 17, 2016: Study Conclusions   - Left ventricle: The cavity size was normal. Wall thickness was    increased in a pattern of mild LVH. Systolic function was normal.    The estimated ejection fraction was in the range of 55% to 60%.  - Aortic valve: Mildly calcified annulus. Mildly thickened, mildly    calcified leaflets. Valve area (VTI): 3.22 cm^2. Valve area    (Vmax): 3.3 cm^2.  - Left atrium: The atrium was mildly dilated.  - Technically adequate study.  Lexiscan on Nov 13, 2016: There was no ST segment deviation noted during stress. Defect 1: There is a medium defect of moderate severity present in the basal inferoseptal, basal inferior, mid inferoseptal and mid inferior location. Findings consistent with prior myocardial infarction. This is an intermediate risk study. Nuclear stress EF: 45%.  Recent Labs: 10/13/2021: ALT 18; BUN 16; Creatinine, Ser 1.35; Potassium 4.4;  Sodium 143  Recent Lipid Panel    Component Value Date/Time   CHOL 161 11/20/2020 1017   TRIG 191 (H) 11/20/2020 1017   HDL 43 11/20/2020 1017   CHOLHDL 3.7 11/20/2020 1017   CHOLHDL 4.0 09/04/2019 0916   VLDL  21 07/02/2016 0853   LDLCALC 86 11/20/2020 1017   LDLCALC 87 09/04/2019 0916    Physical Exam:    VS:  BP 126/60   Pulse 66   Ht '6\' 2"'$  (1.88 m)   Wt 225 lb 12.8 oz (102.4 kg)   SpO2 94%   BMI 28.99 kg/m     Wt Readings from Last 3 Encounters:  08/07/22 225 lb 12.8 oz (102.4 kg)  06/18/22 223 lb 6.4 oz (101.3 kg)  02/16/22 225 lb (102.1 kg)     GEN: Well nourished, well developed in no acute distress HEENT: Normal NECK: No JVD; No carotid bruits CARDIAC: S1/S2, RRR, no murmurs, rubs, gallops; 2+ pulses RESPIRATORY:  Clear to auscultation without rales, wheezing or rhonchi  ABDOMEN: Soft, non-tender, non-distended MUSCULOSKELETAL:  No edema; No deformity  SKIN: Warm and dry NEUROLOGIC:  Alert and oriented x 3 PSYCHIATRIC:  Normal affect   ASSESSMENT:    1. Coronary artery disease involving native heart without angina pectoris, unspecified vessel or lesion type   2. Mixed hyperlipidemia   3. Medication management   4. Essential hypertension, benign   5. OSA (obstructive sleep apnea)   6. Bradycardia   7. Premature atrial contractions   8. Fatigue, unspecified type   9. Dyspnea on exertion    PLAN:    In order of problems listed above:  CAD, s/p CABG x 4 No chest pain today, however admits to some recent fatigue and bradycardia. EKG today reveals NSR, 66 bpm, first degree AV block, with PAC's, T wave abnormality (seen on previous EKG), no acute ischemic changes.  Last ischemic testing was in 2018 and revealed possible inferior scar from prior infarct, although soft tissue attenuation also look to be contributing to this, no large ischemic territory is noted, EF calculated at 45%.  At last office visit, Dr. Domenic Polite recommended repeating ischemic evaluation.   I discussed risk and benefits as outlined below, and he is agreeable to proceed.  Continue aspirin, lisinopril, lovastatin, and Lopressor. Heart healthy diet and regular cardiovascular exercise encouraged. ED precautions discussed.   Shared Decision Making/Informed Consent The risks [chest pain, shortness of breath, cardiac arrhythmias, dizziness, blood pressure fluctuations, myocardial infarction, stroke/transient ischemic attack, nausea, vomiting, allergic reaction, radiation exposure, metallic taste sensation and life-threatening complications (estimated to be 1 in 10,000)], benefits (risk stratification, diagnosing coronary artery disease, treatment guidance) and alternatives of a nuclear stress test were discussed in detail with Mr. Morad and he agrees to proceed.    2. Mixed hyperlipidemia He is due for labs. Will arrange Lipid panel and CMET. Continue Lovastatin. Heart healthy diet and regular cardiovascular exercise encouraged.   3. Hypertension Blood pressure stable today.  BP well-controlled at home.  Continue lisinopril and Lopressor. Heart healthy diet and regular cardiovascular exercise encouraged.  Will obtain a CMET.  4. OSA, bradycardia, PAC's, fatigue and DOE Currently not using CPAP.  One-time episode of seeing heart rate at 46 for pre-op evaluation.  PAC's could have been contributing to this.  Does also note some fatigue and chronic, stable dyspnea on exertion.  Will update echocardiogram at this time in addition to arranging [place].  Obtain the following lab work: CBC, magnesium, and TSH.  Will refer to pulmonology to consider repeat testing for sleep study. Heart healthy diet and regular cardiovascular exercise encouraged.   5. Disposition: Follow-up with me in 6 to 8 weeks or sooner if anything changes.  Medication Adjustments/Labs and Tests Ordered: Current medicines are reviewed at length with  the patient today.  Concerns regarding medicines are outlined above.   Orders Placed This Encounter  Procedures   NM Myocar Multi W/Spect W/Wall Motion / EF   CBC   Comprehensive metabolic panel   Lipid panel   TSH   Magnesium   Ambulatory referral to Pulmonology   EKG 12-Lead   ECHOCARDIOGRAM COMPLETE   No orders of the defined types were placed in this encounter.   Patient Instructions  Medication Instructions:  Continue all other medications.     Labwork: CBC, CMET, FLP, TSH, Mg - orders given today   Testing/Procedures: Your physician has requested that you have a lexiscan myoview. For further information please visit [email or web address]. Please follow instruction sheet, as given. Your physician has requested that you have an echocardiogram. Echocardiography is a painless test that uses sound waves to create images of your heart. It provides your doctor with information about the size and shape of your heart and how well your heart's chambers and valves are working. This procedure takes approximately one hour. There are no restrictions for this procedure. Please do NOT wear cologne, perfume, aftershave, or lotions (deodorant is allowed). Please arrive 15 minutes prior to your appointment time.  Follow-Up: Office will contact with results via phone, letter or mychart.    6 - 8 weeks   Any Other Special Instructions Will Be Listed Below (If Applicable). You have been referred to Pulmonary   If you need a refill on your cardiac medications before your next appointment, please call your pharmacy.    Signed, Finis Bud, NP  08/09/2022 5:20 PM    [facility]

## 2022-08-07 NOTE — Patient Instructions (Addendum)
Medication Instructions:  Continue all other medications.     Labwork: CBC, CMET, FLP, TSH, Mg - orders given today   Testing/Procedures: Your physician has requested that you have a lexiscan myoview. For further information please visit [email or web address]. Please follow instruction sheet, as given. Your physician has requested that you have an echocardiogram. Echocardiography is a painless test that uses sound waves to create images of your heart. It provides your doctor with information about the size and shape of your heart and how well your heart's chambers and valves are working. This procedure takes approximately one hour. There are no restrictions for this procedure. Please do NOT wear cologne, perfume, aftershave, or lotions (deodorant is allowed). Please arrive 15 minutes prior to your appointment time.  Follow-Up: Office will contact with results via phone, letter or mychart.    6 - 8 weeks   Any Other Special Instructions Will Be Listed Below (If Applicable). You have been referred to Pulmonary   If you need a refill on your cardiac medications before your next appointment, please call your pharmacy.

## 2022-08-10 DIAGNOSIS — E782 Mixed hyperlipidemia: Secondary | ICD-10-CM | POA: Diagnosis not present

## 2022-08-10 DIAGNOSIS — Z79899 Other long term (current) drug therapy: Secondary | ICD-10-CM | POA: Diagnosis not present

## 2022-08-10 DIAGNOSIS — R5383 Other fatigue: Secondary | ICD-10-CM | POA: Diagnosis not present

## 2022-08-10 DIAGNOSIS — R0602 Shortness of breath: Secondary | ICD-10-CM | POA: Diagnosis not present

## 2022-08-10 DIAGNOSIS — R001 Bradycardia, unspecified: Secondary | ICD-10-CM | POA: Diagnosis not present

## 2022-08-11 ENCOUNTER — Telehealth: Payer: Self-pay | Admitting: *Deleted

## 2022-08-11 ENCOUNTER — Encounter: Payer: Self-pay | Admitting: *Deleted

## 2022-08-11 LAB — COMPREHENSIVE METABOLIC PANEL
AG Ratio: 1.6 (calc) (ref 1.0–2.5)
ALT: 18 U/L (ref 9–46)
AST: 20 U/L (ref 10–35)
Albumin: 4 g/dL (ref 3.6–5.1)
Alkaline phosphatase (APISO): 83 U/L (ref 35–144)
BUN: 17 mg/dL (ref 7–25)
CO2: 28 mmol/L (ref 20–32)
Calcium: 9.3 mg/dL (ref 8.6–10.3)
Chloride: 103 mmol/L (ref 98–110)
Creat: 1.35 mg/dL (ref 0.70–1.35)
Globulin: 2.5 g/dL (calc) (ref 1.9–3.7)
Glucose, Bld: 218 mg/dL — ABNORMAL HIGH (ref 65–99)
Potassium: 4 mmol/L (ref 3.5–5.3)
Sodium: 141 mmol/L (ref 135–146)
Total Bilirubin: 0.9 mg/dL (ref 0.2–1.2)
Total Protein: 6.5 g/dL (ref 6.1–8.1)

## 2022-08-11 LAB — TSH: TSH: 1.33 mIU/L (ref 0.40–4.50)

## 2022-08-11 LAB — CBC
HCT: 43.2 % (ref 38.5–50.0)
Hemoglobin: 14.6 g/dL (ref 13.2–17.1)
MCH: 31.9 pg (ref 27.0–33.0)
MCHC: 33.8 g/dL (ref 32.0–36.0)
MCV: 94.3 fL (ref 80.0–100.0)
MPV: 10.2 fL (ref 7.5–12.5)
Platelets: 160 10*3/uL (ref 140–400)
RBC: 4.58 10*6/uL (ref 4.20–5.80)
RDW: 13.7 % (ref 11.0–15.0)
WBC: 6.3 10*3/uL (ref 3.8–10.8)

## 2022-08-11 LAB — MAGNESIUM: Magnesium: 1.8 mg/dL (ref 1.5–2.5)

## 2022-08-11 LAB — LIPID PANEL
Cholesterol: 163 mg/dL (ref <200)
HDL: 47 mg/dL (ref >=40)
LDL Cholesterol (Calc): 84 mg/dL (calc)
Non-HDL Cholesterol (Calc): 116 mg/dL (calc) (ref <130)
Total CHOL/HDL Ratio: 3.5 (calc) (ref <5.0)
Triglycerides: 227 mg/dL — ABNORMAL HIGH (ref <150)

## 2022-08-11 NOTE — Telephone Encounter (Signed)
-----  Message from Finis Bud, NP sent at 08/11/2022  3:29 PM EST ----- Result note sent to patient in [place]. See Mychart comments.   Finis Bud, AGNP-C

## 2022-08-11 NOTE — Telephone Encounter (Signed)
Mediterranean diet info sent to patient via mychart.

## 2022-08-12 ENCOUNTER — Encounter (HOSPITAL_COMMUNITY): Payer: Self-pay

## 2022-08-12 ENCOUNTER — Ambulatory Visit (HOSPITAL_COMMUNITY)
Admission: RE | Admit: 2022-08-12 | Discharge: 2022-08-12 | Disposition: A | Discharge status: Home / Self Care | Payer: Medicare HMO | Source: Ambulatory Visit | Attending: Cardiology | Admitting: Cardiology

## 2022-08-12 ENCOUNTER — Ambulatory Visit (HOSPITAL_COMMUNITY)
Admission: RE | Admit: 2022-08-12 | Discharge: 2022-08-12 | Disposition: A | Discharge status: Home / Self Care | Payer: Medicare HMO | Source: Ambulatory Visit | Attending: Nurse Practitioner | Admitting: Nurse Practitioner

## 2022-08-12 DIAGNOSIS — R0609 Other forms of dyspnea: Secondary | ICD-10-CM | POA: Insufficient documentation

## 2022-08-12 LAB — NM MYOCAR MULTI W/SPECT W/WALL MOTION / EF
Base ST Depression (mm): 0 mm
LV dias vol: 185 mL (ref 62–150)
LV sys vol: 119 mL
Nuc Stress EF: 36 %
Peak HR: 63 {beats}/min
RATE: 0.5
Rest HR: 50 {beats}/min
Rest Nuclear Isotope Dose: 10.6 mCi
SDS: 5
SRS: 0
SSS: 5
ST Depression (mm): 0 mm
Stress Nuclear Isotope Dose: 31 mCi
TID: 1.19

## 2022-08-12 MED ORDER — REGADENOSON 0.4 MG/5ML IV SOLN
INTRAVENOUS | Status: AC
  Administered 2022-08-12: 0.4 mg via INTRAVENOUS
  Filled 2022-08-12: qty 5

## 2022-08-12 MED ORDER — TECHNETIUM TC 99M TETROFOSMIN IV KIT
10.0000 | PACK | Freq: Once | INTRAVENOUS | Status: AC | PRN
  Administered 2022-08-12: 10.6 via INTRAVENOUS

## 2022-08-12 MED ORDER — TECHNETIUM TC 99M TETROFOSMIN IV KIT
30.0000 | PACK | Freq: Once | INTRAVENOUS | Status: AC | PRN
  Administered 2022-08-12: 31 via INTRAVENOUS

## 2022-08-12 MED ORDER — SODIUM CHLORIDE FLUSH 0.9 % IV SOLN
INTRAVENOUS | Status: AC
  Administered 2022-08-12: 10 mL via INTRAVENOUS
  Filled 2022-08-12: qty 10

## 2022-08-14 DIAGNOSIS — H2511 Age-related nuclear cataract, right eye: Secondary | ICD-10-CM | POA: Diagnosis not present

## 2022-08-14 DIAGNOSIS — H25811 Combined forms of age-related cataract, right eye: Secondary | ICD-10-CM | POA: Diagnosis not present

## 2022-08-14 DIAGNOSIS — H269 Unspecified cataract: Secondary | ICD-10-CM | POA: Diagnosis not present

## 2022-08-24 ENCOUNTER — Other Ambulatory Visit: Payer: Self-pay | Admitting: Nurse Practitioner

## 2022-08-24 DIAGNOSIS — N1831 Chronic kidney disease, stage 3a: Secondary | ICD-10-CM

## 2022-08-25 ENCOUNTER — Ambulatory Visit: Payer: Medicare HMO | Attending: Nurse Practitioner

## 2022-08-25 DIAGNOSIS — R0609 Other forms of dyspnea: Secondary | ICD-10-CM | POA: Diagnosis not present

## 2022-08-25 DIAGNOSIS — R5383 Other fatigue: Secondary | ICD-10-CM | POA: Diagnosis not present

## 2022-08-25 LAB — ECHOCARDIOGRAM COMPLETE
AR max vel: 3.02 cm2
AV Peak grad: 5.4 mmHg
Ao pk vel: 1.17 m/s
Area-P 1/2: 2.99 cm2
Calc EF: 48.5 %
MV M vel: 4.13 m/s
MV Peak grad: 68.2 mmHg
S' Lateral: 3.75 cm
Single Plane A2C EF: 53.3 %
Single Plane A4C EF: 50.1 %

## 2022-09-01 ENCOUNTER — Encounter: Payer: Self-pay | Admitting: *Deleted

## 2022-09-02 DIAGNOSIS — H35352 Cystoid macular degeneration, left eye: Secondary | ICD-10-CM | POA: Diagnosis not present

## 2022-09-15 DIAGNOSIS — L84 Corns and callosities: Secondary | ICD-10-CM | POA: Diagnosis not present

## 2022-09-15 DIAGNOSIS — L603 Nail dystrophy: Secondary | ICD-10-CM | POA: Diagnosis not present

## 2022-09-15 DIAGNOSIS — E1142 Type 2 diabetes mellitus with diabetic polyneuropathy: Secondary | ICD-10-CM | POA: Diagnosis not present

## 2022-09-16 ENCOUNTER — Telehealth: Payer: Self-pay | Admitting: Nurse Practitioner

## 2022-09-16 ENCOUNTER — Telehealth: Payer: Self-pay

## 2022-09-16 NOTE — Telephone Encounter (Signed)
Pt states that his cardiologist is wanting to change from Lisinopril to Entresto. Pt  just wants to know what Whitney's thoughts are on this with his kidney function.

## 2022-09-16 NOTE — Telephone Encounter (Signed)
Pt left a msg for [place] to call him back. He did not leave any details. Called back. No answer. VM full

## 2022-09-17 NOTE — Telephone Encounter (Signed)
I think it is ok.  As long as we manage his health appropriately (diabetes, high blood pressure, cholesterol) we should be able to preserve his kidney function.  I recommend giving it a try.

## 2022-09-17 NOTE — Telephone Encounter (Signed)
Called pt. No answer. VM full

## 2022-09-17 NOTE — Telephone Encounter (Signed)
Pt.notified

## 2022-09-18 ENCOUNTER — Encounter: Payer: Self-pay | Admitting: Nurse Practitioner

## 2022-09-18 ENCOUNTER — Ambulatory Visit: Payer: Medicare HMO | Attending: Nurse Practitioner | Admitting: Nurse Practitioner

## 2022-09-18 VITALS — BP 148/84 | HR 58 | Ht 74.0 in | Wt 221.8 lb

## 2022-09-18 DIAGNOSIS — I10 Essential (primary) hypertension: Secondary | ICD-10-CM

## 2022-09-18 DIAGNOSIS — G4733 Obstructive sleep apnea (adult) (pediatric): Secondary | ICD-10-CM

## 2022-09-18 DIAGNOSIS — Z79899 Other long term (current) drug therapy: Secondary | ICD-10-CM | POA: Diagnosis not present

## 2022-09-18 DIAGNOSIS — I2510 Atherosclerotic heart disease of native coronary artery without angina pectoris: Secondary | ICD-10-CM | POA: Diagnosis not present

## 2022-09-18 DIAGNOSIS — E782 Mixed hyperlipidemia: Secondary | ICD-10-CM

## 2022-09-18 DIAGNOSIS — I5022 Chronic systolic (congestive) heart failure: Secondary | ICD-10-CM

## 2022-09-18 MED ORDER — EZETIMIBE 10 MG PO TABS: 10.0000 mg | ORAL_TABLET | Freq: Every day | ORAL | 1 refills | Status: DC

## 2022-09-18 NOTE — Patient Instructions (Addendum)
Medication Instructions:  Your physician has recommended you make the following change in your medication:  Start zetia 10 mg once a day Continue all other medications as directed   Labwork: BMET (to be done at [facility] labs in [place] 2 weeks after starting medication)  Testing/Procedures: none  Follow-Up:  Your physician recommends that you schedule a follow-up appointment in: 4-6 weeks  Any Other Special Instructions Will Be Listed Below (If Applicable).  If you need a refill on your cardiac medications before your next appointment, please call your pharmacy.

## 2022-09-18 NOTE — Progress Notes (Signed)
Cardiology Office Note:    Date: 09/18/2022  ID:  [name] [name], DOB 26-Apr-1951, MRN [id number]  PCP:  Curlene Labrum, MD   [place] [facility] Cardiologist:  [name], MD     Referring MD: Curlene Labrum, MD   CC: Here for follow-up  History of Present Illness:    [name] [name] is a 71 y.o. male with a hx of the following:  CAD, s/p CABG x 4 HFmrEF Mixed hyperlipidemia Hypertension Type 2 diabetes OSA on CPAP  Last seen by Dr. Domenic Polite on August 27, 2021.  He was doing very well from a cardiac perspective at that time. Dr. Domenic Polite recommended planning on follow-up testing around time of next visit.  He recommended possibly starting SGLT2 inhibitor for management of his type 2 diabetes and to allow simplification of medication regimen.    At last visit, noted fatigue and DOE -stable per his report.  Lexi scan revealed T wave inversions on EKG, small reversible perfusion defect was noted in mid inferolateral location consistent with ischemia, medium fixed perfusion defect was present in mid to basal inferior location consistent with infarction, EF was noted to be 36%, study was deemed intermediate risk.  Echocardiogram revealed mildly reduced EF at 45 to 50%, no significant valvular abnormalities.  Today he presents for follow-up.  He states he is doing well.  Denies any acute cardiac complaints or issues. Denies any chest pain, shortness of breath, palpitations, syncope, presyncope, dizziness, orthopnea, PND, swelling or significant weight changes, acute bleeding, or claudication.  Echocardiogram and Lexiscan results discussed with patient in office.  SH: Enjoys riding his motorcycle in his free time.   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-grade neoplasm)   Arthritis    Coronary artery disease    NSTEMI [id number]; multivessel CAD s/p post CABG (LIMA-LAD, SVG-D1, SVG-D2,  SVG-OM)   Essential hypertension    GERD (gastroesophageal reflux disease)    Gout    History of kidney stones    History of peptic ulcer disease    Hyperlipidemia    Left ureteral stone    Nodule of right lung    CT 04-04-2017  right lower lobe multiple nodules   OSA on CPAP    Renal cyst, right    CT 04-04-2017   Renal insufficiency    Type 2 diabetes mellitus ([place])    Wears glasses     Past Surgical History:  Procedure Laterality Date   CARDIOVASCULAR STRESS TEST  11-13-2016   dr Domenic Polite   Intermediate risk nuclear study w/ medium defect of moderate severity in the basal inferoseptal, basal inferior, mid inferoseptal and mid inferior location (findings consistant w/ prior myocardial infarction)/  mild enlarged LV cavity size;  nuclear stress EF 45%   COLONOSCOPY N/A 04/13/2017   Procedure: COLONOSCOPY;  Surgeon: Aviva Signs, MD;  [facility];  Service: Gastroenterology;  Laterality: N/A;   CORONARY ARTERY BYPASS GRAFT N/A 04/25/2013   Procedure: CORONARY ARTERY BYPASS GRAFTING (CABG);  Surgeon: [name], MD;  Location: [place];  Service: Open Heart Surgery;  Laterality: N/A;  Coronary artery bypass graft times four, on pump, using left internal mammary artery and right greater saphenous vein via endovein harvest.   CYSTOSCOPY WITH RETROGRADE PYELOGRAM, URETEROSCOPY AND STENT PLACEMENT Left 04/08/2017   Procedure: CYSTOSCOPY WITH RETROGRADE PYELOGRAM, URETEROSCOPY,STENT PLACEMENT;  Surgeon: Lucas Mallow, MD;  Location: [place];  Service: Urology;  Laterality: Left;   CYSTOSCOPY/URETEROSCOPY/HOLMIUM LASER/STENT PLACEMENT Left 05/17/2017   Procedure: CYSTOSCOPY / RETROGRADE PYELOGRAM / URETEROSCOPY / HOLMIUM LASER / STONE BASKETRY / STENT EXCHANGE;  Surgeon: Lucas Mallow, MD;  Location: [place];  Service: Urology;  Laterality: Left;  ONLY NEEDS 45 MIN FOR PROCEDURE   ELBOW BURSA SURGERY Right 03-10-2017     at [facility]    and removal of spur   INTRAOPERATIVE TRANSESOPHAGEAL ECHOCARDIOGRAM N/A 04/25/2013   Procedure: INTRAOPERATIVE TRANSESOPHAGEAL ECHOCARDIOGRAM;  Surgeon: [name], MD;  Location[facility];  Service: Open Heart Surgery;  Laterality: N/A;   KNEE ARTHROSCOPY Left 10/ 2017  approx.    dr Theda Sers   LAPAROSCOPIC RIGHT HEMI COLECTOMY Right 04/19/2017   Procedure: OPEN RIGHT HEMI COLECTOMY;  Surgeon: Virl Cagey, MD;  [facility];  Service: General;  Laterality: Right;   LAPAROSCOPY N/A 04/19/2017   Procedure: LAPAROSCOPY DIAGNOSTIC;  Surgeon: Virl Cagey, MD;  [facility];  Service: General;  Laterality: N/A;  patient knows to arrive at [place] N/A 04/20/2013   Procedure: [place];  Surgeon: [name], MD;  Location: [facility] CATH LAB;  Service: Cardiovascular;  Laterality: N/A;   NASAL SEPTUM SURGERY  1990s approx   TONSILLECTOMY  74 (age 10)   TRANSTHORACIC ECHOCARDIOGRAM  11-17-2016   dr Domenic Polite   mild LVH,  ef 55-60%, LV wall motion & diastolic function indeterminant assessment,  images were inadequate/  mild LAE/  trivial Tr   TYMPANOSTOMY TUBE PLACEMENT      Current Medications: Current Meds  Medication Sig   allopurinol (ZYLOPRIM) 300 MG tablet Take 300 mg by mouth daily.   Ascorbic Acid (VITAMIN C) 1000 MG tablet Take 1,000 mg by mouth daily.   aspirin EC 81 MG tablet Take 1 tablet (81 mg total) by mouth daily. Swallow whole.   escitalopram (LEXAPRO) 10 MG tablet Take 10 mg by mouth at bedtime.    ezetimibe (ZETIA) 10 MG tablet Take 1 tablet (10 mg total) by mouth daily.   lisinopril (PRINIVIL,ZESTRIL) 10 MG tablet Take 5 mg by mouth every morning.    lovastatin (MEVACOR) 20 MG tablet TAKE 1 TABLET AT BEDTIME   metFORMIN (GLUCOPHAGE) 500 MG tablet TAKE 1 TABLET (500 MG TOTAL) BY MOUTH DAILY.   metoprolol tartrate (LOPRESSOR) 25 MG tablet Take 0.5 tablets (12.5 mg  total) by mouth 2 (two) times daily.   Multiple Vitamin (MULTIVITAMIN WITH MINERALS) TABS tablet Take 1 tablet by mouth daily.   Omega-3 Fatty Acids (FISH OIL) 1000 MG CPDR Take 1 capsule by mouth 2 (two) times a day.    omeprazole (PRILOSEC) 20 MG capsule Take 20 mg by mouth every morning.    pyridoxine (B-6) 200 MG tablet Take 200 mg by mouth daily.     Allergies:   Patient has no known allergies.   Social History   Socioeconomic History   Marital status: Married    Spouse name: Not on file   Number of children: Not on file   Years of education: Not on file   Highest education level: Not on file  Occupational History   Occupation: Drives truck    Employer: [employer]  Tobacco Use   Smoking status: Never   Smokeless tobacco: Never  Vaping Use   Vaping Use: Never used  Substance and Sexual Activity   Alcohol use: No  Alcohol/week: 0.0 standard drinks of alcohol   Drug use: No   Sexual activity: Yes    Birth control/protection: None  Other Topics Concern   Not on file  Social History Narrative   Lives with wife, does not exercise but part of his job is strenuous. Works in a body-shop part-time, too.   2 of his mother's sisters and 1 brother died suddenly (family was told MI but no autopsy).    Social Determinants of Health   Financial Resource Strain: Low Risk  (05/11/2022)   Overall Financial Resource Strain (CARDIA)    Difficulty of Paying Living Expenses: Not hard at all  Food Insecurity: No Food Insecurity (03/05/2022)   Hunger Vital Sign    Worried About Running Out of Food in the Last Year: Never true    Ran Out of Food in the Last Year: Never true  Transportation Needs: No Transportation Needs (05/11/2022)   PRAPARE - Hydrologist (Medical): No    Lack of Transportation (Non-Medical): No  Physical Activity: Not on file  Stress: Not on file  Social Connections: Not on file     Family History: The patient's family history  includes CAD in his father and mother; Colon cancer in his father; Lung cancer in his father.  ROS:   Please see the history of present illness.    All other systems reviewed and are negative.  EKGs/Labs/Other Studies Reviewed:    The following studies were reviewed today:   EKG:  EKG is not ordered today.   Echocardiogram on August 25, 2022:  1. Left ventricular ejection fraction, by estimation, is 45 to 50%. The  left ventricle has mildly decreased function. The left ventricle has no  regional wall motion abnormalities. Left ventricular diastolic parameters  are indeterminate.   2. Right ventricular systolic function is mildly reduced. The right  ventricular size is normal.   3. Left atrial size was mildly dilated.   4. The mitral valve is normal in structure. Trivial mitral valve  regurgitation. No evidence of mitral stenosis.   5. The aortic valve is tricuspid. There is mild calcification of the  aortic valve. Aortic valve regurgitation is not visualized. No aortic  stenosis is present.   Comparison(s): No prior Echocardiogram.    Lexiscan on August 12, 2022:   Pharmacological stress ECG is negative for ischemia. Rest and stress ECG positive for diffuse T wave inversions, 1 mm. No chest pain during the test.   LV perfusion is abnormal. There is a small reversible perfusion defect present in the mid inferolateral location consistent with ischemia. There is a medium fixed perfusion defect present in the mid to basal inferior location consistent with infarction.   Nuclear stress EF: 36 %. The left ventricular ejection fraction is moderately decreased (30-44%). End diastolic cavity size is moderately enlarged. End systolic cavity size is moderately enlarged.   Findings are consistent with ischemia and infarction. The study is intermediate risk.  Echocardiogram on Nov 17, 2016: Study Conclusions   - Left ventricle: The cavity size was normal. Wall thickness was    increased  in a pattern of mild LVH. Systolic function was normal.    The estimated ejection fraction was in the range of 55% to 60%.  - Aortic valve: Mildly calcified annulus. Mildly thickened, mildly    calcified leaflets. Valve area (VTI): 3.22 cm^2. Valve area    (Vmax): 3.3 cm^2.  - Left atrium: The atrium was mildly dilated.  -  Technically adequate study.  Lexiscan on Nov 13, 2016: There was no ST segment deviation noted during stress. Defect 1: There is a medium defect of moderate severity present in the basal inferoseptal, basal inferior, mid inferoseptal and mid inferior location. Findings consistent with prior myocardial infarction. This is an intermediate risk study. Nuclear stress EF: 45%.  Recent Labs: 08/10/2022: ALT 18; BUN 17; Creat 1.35; Hemoglobin 14.6; Magnesium 1.8; Platelets 160; Potassium 4.0; Sodium 141; TSH 1.33  Recent Lipid Panel    Component Value Date/Time   CHOL 163 08/10/2022 1004   CHOL 161 11/20/2020 1017   TRIG 227 (H) 08/10/2022 1004   HDL 47 08/10/2022 1004   HDL 43 11/20/2020 1017   CHOLHDL 3.5 08/10/2022 1004   VLDL 21 07/02/2016 0853   LDLCALC 84 08/10/2022 1004    Physical Exam:    VS:  BP (!) 148/84   Pulse (!) 58   Ht '6\' 2"'$  (1.88 m)   Wt 221 lb 12.8 oz (100.6 kg)   SpO2 97%   BMI 28.48 kg/m     Wt Readings from Last 3 Encounters:  09/18/22 221 lb 12.8 oz (100.6 kg)  08/07/22 225 lb 12.8 oz (102.4 kg)  06/18/22 223 lb 6.4 oz (101.3 kg)     GEN: Well nourished, well developed in no acute distress HEENT: Normal NECK: No JVD; No carotid bruits CARDIAC: S1/S2, RRR, no murmurs, rubs, gallops; 2+ pulses RESPIRATORY:  Clear to auscultation without rales, wheezing or rhonchi  MUSCULOSKELETAL:  No edema; No deformity  SKIN: Warm and dry NEUROLOGIC:  Alert and oriented x 3 PSYCHIATRIC:  Normal affect   ASSESSMENT:    1. Coronary artery disease involving native heart without angina pectoris, unspecified vessel or lesion type   2. Heart  failure with mildly reduced ejection fraction (HFmrEF) ([place])   3. Medication management   4. Mixed hyperlipidemia   5. Essential hypertension, benign   6. OSA (obstructive sleep apnea)    PLAN:    In order of problems listed above:  CAD, s/p CABG x 4 No chest pain.  Recent NST was deemed intermediate risk, findings were consistent with ischemia and infarction.  Discussed results and possible cardiac catheterization with patient.  Pt stated last cardiac cath he had performed showed microvascular CAD, was not able to do intervention.  Recent TTE revealed mildly reduced EF.  Continue aspirin, lisinopril, lovastatin, and Lopressor. Discussed stopping lisinopril and starting Entresto, pt concerned about cost. Will initiate Zetia and pt is considering SGLT2 inhibitor-see below.  Heart healthy diet and regular cardiovascular exercise encouraged. ED precautions discussed.  Will route note to Dr. Domenic Polite to determine if cardiac catheterization needs to be considered at next follow-up visit.  At next office visit, we will will consider starting Imdur.  Addendum 09/19/2022: Dr. Domenic Polite agreed with medical management at this time as patient is not symptomatic and is stable.  2. HFmrEF Echocardiogram revealed EF mildly reduced at 45 to 50%.  Echocardiogram in 2018 revealed EF at 55 to 60%.  At previous visit, Dr. Domenic Polite recommended starting SGLT2 inhibitor, which I am in agreement with.  Continue aspirin, lisinopril, lovastatin, Lopressor, and planned to start SGLT2i, with repeat BMET in 2 weeks per protocol - however pt requests to discuss with his wife, pt will give [place] a call with his decision, and if agreeable to SGLT2i - will repeat BMET in 2 weeks per protocol.  Patient is concerned about price so we will start whichever one is cheapest for him  with his insurance.  If cost going to be an issue, plan to initiate Imdur as mentioned above. Low sodium diet, fluid restriction <2L, and daily weights encouraged.  Educated to contact our office for weight gain of 2 lbs overnight or 5 lbs in one week.   3. Mixed hyperlipidemia Last LDL was 116.  Continue Lovastatin.  Will initiate Zetia.  Heart healthy diet and regular cardiovascular exercise encouraged.  Plan to repeat FLP and LFT at next office visit.  4. Hypertension Blood pressure elevated today.  BP well-controlled at home.  Discussed to monitor BP at home at least 2 hours after medications and sitting for 5-10 minutes. Continue lisinopril and Lopressor. Heart healthy diet and regular cardiovascular exercise encouraged.    5. OSA Previously referred to pulmonology.  Sees Dr. Elsworth Soho.  Heart healthy diet and regular cardiovascular exercise encouraged.   6. Disposition: Follow-up with me or APP in 4-6 weeks or sooner if anything changes.  Medication Adjustments/Labs and Tests Ordered: Current medicines are reviewed at length with the patient today.  Concerns regarding medicines are outlined above.  Orders Placed This Encounter  Procedures   Basic metabolic panel   Meds ordered this encounter  Medications   ezetimibe (ZETIA) 10 MG tablet    Sig: Take 1 tablet (10 mg total) by mouth daily.    Dispense:  90 tablet    Refill:  1    09/18/22 New Start    Patient Instructions  Medication Instructions:  Your physician has recommended you make the following change in your medication:  Start zetia 10 mg once a day Continue all other medications as directed   Labwork: BMET (to be done at [facility] labs in [place] 2 weeks after starting medication)  Testing/Procedures: none  Follow-Up:  Your physician recommends that you schedule a follow-up appointment in: 4-6 weeks  Any Other Special Instructions Will Be Listed Below (If Applicable).  If you need a refill on your cardiac medications before your next appointment, please call your pharmacy.    SignedFinis Bud, NP  09/18/2022 11:59 AM    [place]

## 2022-09-29 DIAGNOSIS — G4733 Obstructive sleep apnea (adult) (pediatric): Secondary | ICD-10-CM | POA: Diagnosis not present

## 2022-09-29 DIAGNOSIS — E1169 Type 2 diabetes mellitus with other specified complication: Secondary | ICD-10-CM | POA: Diagnosis not present

## 2022-09-29 DIAGNOSIS — E782 Mixed hyperlipidemia: Secondary | ICD-10-CM | POA: Diagnosis not present

## 2022-09-29 DIAGNOSIS — E7849 Other hyperlipidemia: Secondary | ICD-10-CM | POA: Diagnosis not present

## 2022-09-29 DIAGNOSIS — I10 Essential (primary) hypertension: Secondary | ICD-10-CM | POA: Diagnosis not present

## 2022-09-30 DIAGNOSIS — H35351 Cystoid macular degeneration, right eye: Secondary | ICD-10-CM | POA: Diagnosis not present

## 2022-10-11 ENCOUNTER — Other Ambulatory Visit: Payer: Self-pay | Admitting: Cardiology

## 2022-10-12 ENCOUNTER — Encounter: Payer: Self-pay | Admitting: *Deleted

## 2022-10-15 ENCOUNTER — Ambulatory Visit (INDEPENDENT_AMBULATORY_CARE_PROVIDER_SITE_OTHER): Payer: Medicare HMO | Admitting: Pulmonary Disease

## 2022-10-15 ENCOUNTER — Encounter: Payer: Self-pay | Admitting: Pulmonary Disease

## 2022-10-15 VITALS — BP 138/88 | HR 64 | Ht 73.0 in | Wt 217.0 lb

## 2022-10-15 DIAGNOSIS — R918 Other nonspecific abnormal finding of lung field: Secondary | ICD-10-CM

## 2022-10-15 DIAGNOSIS — G4733 Obstructive sleep apnea (adult) (pediatric): Secondary | ICD-10-CM | POA: Diagnosis not present

## 2022-10-15 NOTE — Progress Notes (Signed)
Subjective:    Patient ID: [name] [name], male    DOB: [birth year], 72 y.o.   MRN: [id number]  HPI  72 year old male presents for evaluation of OSA. OSA was diagnosed few years ago and he was placed on CPAP with improvement in his daytime somnolence and fatigue.  He felt like he rested better.  He settled down with a fullface mask.  He admits that he would not use the machine every night but clearly had benefits when he would use it.  He had another study done in 2021 but was unable to get a replacement machine due to the backlog.  He is now interested in reevaluation  PMH :  CAD, s/p CABG x 4 -2014 HFrEF -EF 45-50% Hypertension Type 2 diabetes  He reports nonrefreshing sleep.  He is restless through the night and tosses and turns from side-to-side.  He generally does not sleep on his back.  Bedtime is around 10 PM, sleep latency about 30 minutes, he reports 2-3 nocturnal awakenings and is out of bed latest by 9:30 AM feeling rested with dryness of mouth but denies headaches. Epworth sleepiness score is 8.  Weight is mostly unchanged compared to 2021 There is no history suggestive of cataplexy, sleep paralysis or parasomnias   Significant tests/ events reviewed  NPSG 03/2020 - wt 220 lbs - TST 371 mins, AHI 15/hour, worse during supine sleep, low saturation 79%  CT chest with contrast 08/2019 stable lung nodules  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-grade neoplasm)   Arthritis    Coronary artery disease    NSTEMI [id number]; multivessel CAD s/p post CABG (LIMA-LAD, SVG-D1, SVG-D2, SVG-OM)   Essential hypertension    GERD (gastroesophageal reflux disease)    Gout    History of kidney stones    History of peptic ulcer disease    Hyperlipidemia    Left ureteral stone    Nodule of right lung    CT 04-04-2017  right lower lobe multiple nodules   OSA on CPAP    Renal cyst, right    CT 04-04-2017   Renal  insufficiency    Type 2 diabetes mellitus ([place])    Wears glasses    Past Surgical History:  Procedure Laterality Date   CARDIOVASCULAR STRESS TEST  11-13-2016   dr Domenic Polite   Intermediate risk nuclear study w/ medium defect of moderate severity in the basal inferoseptal, basal inferior, mid inferoseptal and mid inferior location (findings consistant w/ prior myocardial infarction)/  mild enlarged LV cavity size;  nuclear stress EF 45%   COLONOSCOPY N/A 04/13/2017   Procedure: COLONOSCOPY;  Surgeon: Aviva Signs, MD;  [facility];  Service: Gastroenterology;  Laterality: N/A;   CORONARY ARTERY BYPASS GRAFT N/A 04/25/2013   Procedure: CORONARY ARTERY BYPASS GRAFTING (CABG);  Surgeon: [name], MD;  Location: [place];  Service: Open Heart Surgery;  Laterality: N/A;  Coronary artery bypass graft times four, on pump, using left internal mammary artery and right greater saphenous vein via endovein harvest.   CYSTOSCOPY WITH RETROGRADE PYELOGRAM, URETEROSCOPY AND STENT PLACEMENT Left 04/08/2017   Procedure: CYSTOSCOPY WITH RETROGRADE PYELOGRAM, URETEROSCOPY,STENT PLACEMENT;  Surgeon: Lucas Mallow, MD;  [facility];  Service: Urology;  Laterality: Left;   CYSTOSCOPY/URETEROSCOPY/HOLMIUM LASER/STENT PLACEMENT Left 05/17/2017   Procedure: CYSTOSCOPY / RETROGRADE PYELOGRAM / URETEROSCOPY / HOLMIUM LASER / STONE BASKETRY / STENT EXCHANGE;  Surgeon: Lucas Mallow, MD;  Location: [facility];  Service: Urology;  Laterality: Left;  ONLY NEEDS 45 MIN FOR PROCEDURE   ELBOW BURSA SURGERY Right 03-10-2017     at [facility]   and removal of spur   INTRAOPERATIVE TRANSESOPHAGEAL ECHOCARDIOGRAM N/A 04/25/2013   Procedure: INTRAOPERATIVE TRANSESOPHAGEAL ECHOCARDIOGRAM;  Surgeon: [name], MD;  Location: [facility] OR;  Service: Open Heart Surgery;  Laterality: N/A;   KNEE ARTHROSCOPY Left 10/ 2017  approx.    dr Theda Sers   LAPAROSCOPIC RIGHT HEMI COLECTOMY  Right 04/19/2017   Procedure: OPEN RIGHT HEMI COLECTOMY;  Surgeon: Virl Cagey, MD;  [facility];  Service: General;  Laterality: Right;   LAPAROSCOPY N/A 04/19/2017   Procedure: LAPAROSCOPY DIAGNOSTIC;  Surgeon: Virl Cagey, MD;  Location[facility];  Service: General;  Laterality: N/A;  patient knows to arrive at [place] N/A 04/20/2013   Procedure: [place];  Surgeon: [name], MD;  Location: [facility] CATH LAB;  Service: Cardiovascular;  Laterality: N/A;   NASAL SEPTUM SURGERY  1990s approx   TONSILLECTOMY  74 (age 35)   TRANSTHORACIC ECHOCARDIOGRAM  11-17-2016   dr Domenic Polite   mild LVH,  ef 55-60%, LV wall motion & diastolic function indeterminant assessment,  images were inadequate/  mild LAE/  trivial Tr   TYMPANOSTOMY TUBE PLACEMENT      No Known Allergies   Social History   Socioeconomic History   Marital status: Married    Spouse name: Not on file   Number of children: Not on file   Years of education: Not on file   Highest education level: Not on file  Occupational History   Occupation: Drives truck    Employer: [employer]  Tobacco Use   Smoking status: Never   Smokeless tobacco: Never  Vaping Use   Vaping Use: Never used  Substance and Sexual Activity   Alcohol use: No    Alcohol/week: 0.0 standard drinks of alcohol   Drug use: No   Sexual activity: Yes    Birth control/protection: None  Other Topics Concern   Not on file  Social History Narrative   Lives with wife, does not exercise but part of his job is strenuous. Works in a body-shop part-time, too.   2 of his mother's sisters and 1 brother died suddenly (family was told MI but no autopsy).    Social Determinants of Health   Financial Resource Strain: Low Risk  (05/11/2022)   Overall Financial Resource Strain (CARDIA)    Difficulty of Paying Living Expenses: Not hard at all  Food  Insecurity: No Food Insecurity (03/05/2022)   Hunger Vital Sign    Worried About Running Out of Food in the Last Year: Never true    Ran Out of Food in the Last Year: Never true  Transportation Needs: No Transportation Needs (05/11/2022)   PRAPARE - Hydrologist (Medical): No    Lack of Transportation (Non-Medical): No  Physical Activity: Not on file  Stress: Not on file  Social Connections: Not on file  Intimate Partner Violence: Not on file    Family History  Problem Relation Age of Onset   CAD Mother    CAD Father    Lung cancer Father    Colon cancer Father      Review of Systems  Constitutional: negative for anorexia, fevers and sweats  Eyes:  negative for irritation, redness and visual disturbance  Ears, nose, mouth, throat, and face: negative for earaches, epistaxis, nasal congestion and sore throat  Respiratory: negative for cough, dyspnea on exertion, sputum and wheezing  Cardiovascular: negative for chest pain, dyspnea, lower extremity edema, orthopnea, palpitations and syncope  Gastrointestinal: negative for abdominal pain, constipation, diarrhea, melena, nausea and vomiting  Genitourinary:negative for dysuria, frequency and hematuria  Hematologic/lymphatic: negative for bleeding, easy bruising and lymphadenopathy  Musculoskeletal:negative for arthralgias, muscle weakness and stiff joints  Neurological: negative for coordination problems, gait problems, headaches and weakness  Endocrine: negative for diabetic symptoms including polydipsia, polyuria and weight loss     Objective:   Physical Exam  Gen. Pleasant, obese, in no distress, normal affect ENT - no pallor,icterus, no post nasal drip, class 2-3 airway Neck: No JVD, no thyromegaly, no carotid bruits Lungs: no use of accessory muscles, no dullness to percussion, decreased without rales or rhonchi  Cardiovascular: Rhythm regular, heart sounds  normal, no murmurs or gallops, no  peripheral edema Abdomen: soft and non-tender, no hepatosplenomegaly, BS normal. Musculoskeletal: No deformities, no cyanosis or clubbing Neuro:  alert, non focal, no tremors       Assessment & Plan:

## 2022-10-15 NOTE — Assessment & Plan Note (Signed)
He had moderate OSA on his sleep study in 2021.  We will reassess with a home test.  He clearly has positional OSA and this is worse in supine position.  Given his high risk cardiac profile we may have a lower threshold to treat even mild OSA. We will provide him with a replacement CPAP based on the results of his sleep test  Weight loss encouraged, compliance with goal of at least 4-6 hrs every night is the expectation. Advised against medications with sedative side effects Cautioned against driving when sleepy - understanding that sleepiness will vary on a day to day basis

## 2022-10-15 NOTE — Patient Instructions (Signed)
X Home sleep test 

## 2022-10-15 NOTE — Assessment & Plan Note (Signed)
Noted to be stable from 20 18-20 21 Not noted on chest x-ray in 2022

## 2022-10-16 ENCOUNTER — Ambulatory Visit: Payer: Medicare HMO | Admitting: Nurse Practitioner

## 2022-10-20 DIAGNOSIS — Z1389 Encounter for screening for other disorder: Secondary | ICD-10-CM | POA: Diagnosis not present

## 2022-10-20 DIAGNOSIS — I5020 Unspecified systolic (congestive) heart failure: Secondary | ICD-10-CM | POA: Diagnosis not present

## 2022-10-20 DIAGNOSIS — I10 Essential (primary) hypertension: Secondary | ICD-10-CM | POA: Diagnosis not present

## 2022-10-20 DIAGNOSIS — Z951 Presence of aortocoronary bypass graft: Secondary | ICD-10-CM | POA: Diagnosis not present

## 2022-10-20 DIAGNOSIS — Z1331 Encounter for screening for depression: Secondary | ICD-10-CM | POA: Diagnosis not present

## 2022-10-20 DIAGNOSIS — D126 Benign neoplasm of colon, unspecified: Secondary | ICD-10-CM | POA: Diagnosis not present

## 2022-10-20 DIAGNOSIS — I25119 Atherosclerotic heart disease of native coronary artery with unspecified angina pectoris: Secondary | ICD-10-CM | POA: Diagnosis not present

## 2022-10-20 DIAGNOSIS — E7849 Other hyperlipidemia: Secondary | ICD-10-CM | POA: Diagnosis not present

## 2022-10-20 DIAGNOSIS — M1A00X0 Idiopathic chronic gout, unspecified site, without tophus (tophi): Secondary | ICD-10-CM | POA: Diagnosis not present

## 2022-10-20 DIAGNOSIS — E1122 Type 2 diabetes mellitus with diabetic chronic kidney disease: Secondary | ICD-10-CM | POA: Diagnosis not present

## 2022-10-20 DIAGNOSIS — E1169 Type 2 diabetes mellitus with other specified complication: Secondary | ICD-10-CM | POA: Diagnosis not present

## 2022-10-20 DIAGNOSIS — E1140 Type 2 diabetes mellitus with diabetic neuropathy, unspecified: Secondary | ICD-10-CM | POA: Diagnosis not present

## 2022-10-21 ENCOUNTER — Ambulatory Visit: Payer: Medicare HMO | Admitting: Nurse Practitioner

## 2022-10-21 ENCOUNTER — Other Ambulatory Visit: Payer: Self-pay | Admitting: *Deleted

## 2022-10-21 ENCOUNTER — Encounter: Payer: Self-pay | Admitting: Nurse Practitioner

## 2022-10-21 VITALS — BP 138/79 | HR 60 | Ht 73.0 in | Wt 219.0 lb

## 2022-10-21 DIAGNOSIS — N1831 Chronic kidney disease, stage 3a: Secondary | ICD-10-CM | POA: Diagnosis not present

## 2022-10-21 DIAGNOSIS — E782 Mixed hyperlipidemia: Secondary | ICD-10-CM

## 2022-10-21 DIAGNOSIS — E1122 Type 2 diabetes mellitus with diabetic chronic kidney disease: Secondary | ICD-10-CM

## 2022-10-21 DIAGNOSIS — I10 Essential (primary) hypertension: Secondary | ICD-10-CM | POA: Diagnosis not present

## 2022-10-21 LAB — HEMOGLOBIN A1C: Hemoglobin A1C: 7.1

## 2022-10-21 NOTE — Progress Notes (Signed)
10/21/2022        Endocrinology follow-up note     Subjective:    Patient ID: [name] [name], male    DOB: [birth year], PCP Leandrew Koyanagi Ananias Pilgrim, MD   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-grade neoplasm)   Arthritis    Coronary artery disease    NSTEMI 102014; multivessel CAD s/p post CABG (LIMA-LAD, SVG-D1, SVG-D2, SVG-OM)   Essential hypertension    GERD (gastroesophageal reflux disease)    Gout    History of kidney stones    History of peptic ulcer disease    Hyperlipidemia    Left ureteral stone    Nodule of right lung    CT 04-04-2017  right lower lobe multiple nodules   OSA on CPAP    Renal cyst, right    CT 04-04-2017   Renal insufficiency    Type 2 diabetes mellitus    Wears glasses    Past Surgical History:  Procedure Laterality Date   CARDIOVASCULAR STRESS TEST  11-13-2016   dr Diona Browner   Intermediate risk nuclear study w/ medium defect of moderate severity in the basal inferoseptal, basal inferior, mid inferoseptal and mid inferior location (findings consistant w/ prior myocardial infarction)/  mild enlarged LV cavity size;  nuclear stress EF 45%   COLONOSCOPY N/A 04/13/2017   Procedure: COLONOSCOPY;  Surgeon: Franky Macho, MD;  Location: [facility];  Service: Gastroenterology;  Laterality: N/A;   CORONARY ARTERY BYPASS GRAFT N/A 04/25/2013   Procedure: CORONARY ARTERY BYPASS GRAFTING (CABG);  Surgeon: Alleen Borne, MD;  Location: [facility] OR;  Service: Open Heart Surgery;  Laterality: N/A;  Coronary artery bypass graft times four, on pump, using left internal mammary artery and right greater saphenous vein via endovein harvest.   CYSTOSCOPY WITH RETROGRADE PYELOGRAM, URETEROSCOPY AND STENT PLACEMENT Left 04/08/2017   Procedure: CYSTOSCOPY WITH RETROGRADE PYELOGRAM, URETEROSCOPY,STENT PLACEMENT;  Surgeon: Crista Elliot, MD;  Location: [facility];  Service: Urology;   Laterality: Left;   CYSTOSCOPY/URETEROSCOPY/HOLMIUM LASER/STENT PLACEMENT Left 05/17/2017   Procedure: CYSTOSCOPY / RETROGRADE PYELOGRAM / URETEROSCOPY / HOLMIUM LASER / STONE BASKETRY / STENT EXCHANGE;  Surgeon: Crista Elliot, MD;  Location: [facility];  Service: Urology;  Laterality: Left;  ONLY NEEDS 45 MIN FOR PROCEDURE   ELBOW BURSA SURGERY Right 03-10-2017     at [facility]   and removal of spur   INTRAOPERATIVE TRANSESOPHAGEAL ECHOCARDIOGRAM N/A 04/25/2013   Procedure: INTRAOPERATIVE TRANSESOPHAGEAL ECHOCARDIOGRAM;  Surgeon: Alleen Borne, MD;  Location: [facility] OR;  Service: Open Heart Surgery;  Laterality: N/A;   KNEE ARTHROSCOPY Left 10/ 2017  approx.    dr Thomasena Edis   LAPAROSCOPIC RIGHT HEMI COLECTOMY Right 04/19/2017   Procedure: OPEN RIGHT HEMI COLECTOMY;  Surgeon: Lucretia Roers, MD;  Location: [facility];  Service: General;  Laterality: Right;   LAPAROSCOPY N/A 04/19/2017   Procedure: LAPAROSCOPY DIAGNOSTIC;  Surgeon: Lucretia Roers, MD;  Location: [facility];  Service: General;  Laterality: N/A;  patient knows to arrive at 6:15   LEFT HEART CATHETERIZATION WITH CORONARY ANGIOGRAM N/A 04/20/2013   Procedure: LEFT HEART CATHETERIZATION WITH CORONARY ANGIOGRAM;  Surgeon: Micheline Chapman, MD;  Location: [facility] CATH LAB;  Service: Cardiovascular;  Laterality: N/A;   NASAL SEPTUM SURGERY  1990s approx   TONSILLECTOMY  20 (age 71)   TRANSTHORACIC ECHOCARDIOGRAM  11-17-2016   dr Diona Browner   mild  LVH,  ef 55-60%, LV wall motion & diastolic function indeterminant assessment,  images were inadequate/  mild LAE/  trivial Tr   TYMPANOSTOMY TUBE PLACEMENT     Social History   Socioeconomic History   Marital status: Married    Spouse name: Not on file   Number of children: Not on file   Years of education: Not on file   Highest education level: Not on file  Occupational History   Occupation: Drives truck    Employer: [employer]  Tobacco Use   Smoking status: Never    Smokeless tobacco: Never  Vaping Use   Vaping Use: Never used  Substance and Sexual Activity   Alcohol use: No    Alcohol/week: 0.0 standard drinks of alcohol   Drug use: No   Sexual activity: Yes    Birth control/protection: None  Other Topics Concern   Not on file  Social History Narrative   Lives with wife, does not exercise but part of his job is strenuous. Works in a body-shop part-time, too.   2 of his mother's sisters and 1 brother died suddenly (family was told MI but no autopsy).    Social Determinants of Health   Financial Resource Strain: Low Risk  (05/11/2022)   Overall Financial Resource Strain (CARDIA)    Difficulty of Paying Living Expenses: Not hard at all  Food Insecurity: No Food Insecurity (03/05/2022)   Hunger Vital Sign    Worried About Running Out of Food in the Last Year: Never true    Ran Out of Food in the Last Year: Never true  Transportation Needs: No Transportation Needs (05/11/2022)   PRAPARE - Administrator, Civil Service (Medical): No    Lack of Transportation (Non-Medical): No  Physical Activity: Not on file  Stress: Not on file  Social Connections: Not on file   Outpatient Encounter Medications as of 10/21/2022  Medication Sig   allopurinol (ZYLOPRIM) 300 MG tablet Take 300 mg by mouth daily.   Ascorbic Acid (VITAMIN C) 1000 MG tablet Take 1,000 mg by mouth daily.   aspirin EC 81 MG tablet Take 1 tablet (81 mg total) by mouth daily. Swallow whole.   Blood Glucose Monitoring Suppl (ONE TOUCH ULTRA 2) w/Device KIT USE TO TEST BLOOD ONCE A DAY   escitalopram (LEXAPRO) 10 MG tablet Take 10 mg by mouth at bedtime.    ezetimibe (ZETIA) 10 MG tablet Take 1 tablet (10 mg total) by mouth daily.   glucose blood (ONETOUCH ULTRA) test strip Use as instructed to monitor glucose once daily   Lancets (ONETOUCH ULTRASOFT) lancets Use as instructed to monitor glucose once daily   lisinopril (PRINIVIL,ZESTRIL) 10 MG tablet Take 5 mg by mouth every  morning.    lovastatin (MEVACOR) 20 MG tablet TAKE 1 TABLET AT BEDTIME   metFORMIN (GLUCOPHAGE) 500 MG tablet TAKE 1 TABLET (500 MG TOTAL) BY MOUTH DAILY.   metoprolol tartrate (LOPRESSOR) 25 MG tablet Take 0.5 tablets (12.5 mg total) by mouth 2 (two) times daily.   Multiple Vitamin (MULTIVITAMIN WITH MINERALS) TABS tablet Take 1 tablet by mouth daily.   Omega-3 Fatty Acids (FISH OIL) 1000 MG CPDR Take 1 capsule by mouth 2 (two) times a day.    omeprazole (PRILOSEC) 20 MG capsule Take 20 mg by mouth every morning.    pyridoxine (B-6) 200 MG tablet Take 200 mg by mouth daily.   No facility-administered encounter medications on file as of 10/21/2022.   ALLERGIES: No Known  Allergies VACCINATION STATUS: Immunization History  Administered Date(s) Administered   Influenza Split 02/10/2014   Influenza,inj,Quad PF,6+ Mos 04/20/2017    Diabetes He presents for his follow-up diabetic visit. He has type 2 diabetes mellitus. His disease course has been improving. There are no hypoglycemic associated symptoms. Pertinent negatives for hypoglycemia include no confusion, headaches, pallor or seizures. Pertinent negatives for diabetes include no chest pain, no fatigue, no foot ulcerations, no polydipsia, no polyphagia, no polyuria and no weakness. There are no hypoglycemic complications. Symptoms are resolved. Diabetic complications include heart disease and nephropathy. Risk factors for coronary artery disease include diabetes mellitus, dyslipidemia, hypertension, male sex and sedentary lifestyle. Current diabetic treatment includes oral agent (monotherapy). He is compliant with treatment all of the time. His weight is decreasing steadily. He is following a generally healthy diet. When asked about meal planning, he reported none. He has not had a previous visit with a dietitian. He never participates in exercise. His home blood glucose trend is fluctuating minimally. His breakfast blood glucose range is  generally 110-130 mg/dl. (He presents today with his logs, no meter, showing stable slightly above target fasting glycemic profile.  His POCT A1c today is 7.1%, improving from last visit.  He notes he has recently changed his diet, eating less red meats, more fruits and veggies as he has been having heart problems.  ) An ACE inhibitor/angiotensin II receptor blocker is being taken. He does not see a podiatrist.Eye exam is current.  Hyperlipidemia This is a chronic problem. The current episode started more than 1 year ago. The problem is uncontrolled. Recent lipid tests were reviewed and are variable. Exacerbating diseases include chronic renal disease and diabetes. Factors aggravating his hyperlipidemia include beta blockers. Pertinent negatives include no chest pain, myalgias or shortness of breath. Current antihyperlipidemic treatment includes statins. The current treatment provides mild improvement of lipids. Compliance problems include adherence to diet and adherence to exercise.  Risk factors for coronary artery disease include diabetes mellitus, dyslipidemia, hypertension, male sex and a sedentary lifestyle.  Hypertension This is a chronic problem. The current episode started more than 1 year ago. The problem has been resolved since onset. The problem is controlled. Pertinent negatives include no chest pain, headaches, neck pain, palpitations or shortness of breath. There are no associated agents to hypertension. Risk factors for coronary artery disease include dyslipidemia, diabetes mellitus, male gender and sedentary lifestyle. Past treatments include beta blockers and ACE inhibitors. The current treatment provides mild improvement. There are no compliance problems.  Hypertensive end-organ damage includes kidney disease and CAD/MI. Identifiable causes of hypertension include chronic renal disease.   Review of systems  Constitutional: + steadily decreasing body weight (intentional),  current Body mass  index is 28.89 kg/m. , no fatigue, no subjective hyperthermia, no subjective hypothermia Eyes: no blurry vision, no xerophthalmia ENT: no sore throat, no nodules palpated in throat, no dysphagia/odynophagia, no hoarseness Cardiovascular: no chest pain, no shortness of breath, no palpitations, no leg swelling Respiratory: no cough, no shortness of breath Gastrointestinal: no nausea/vomiting/diarrhea Musculoskeletal: no muscle/joint aches Skin: no rashes, no hyperemia Neurological: no tremors, no numbness, no tingling, no dizziness Psychiatric: no depression, no anxiety    Objective:    BP 138/79 (BP Location: Left Arm, Patient Position: Sitting, Cuff Size: Large)   Pulse 60   Ht 6\' 1"  (1.854 m)   Wt 219 lb (99.3 kg)   BMI 28.89 kg/m   Wt Readings from Last 3 Encounters:  10/21/22 219 lb (99.3 kg)  10/15/22 217 lb (98.4 kg)  09/18/22 221 lb 12.8 oz (100.6 kg)    BP Readings from Last 3 Encounters:  10/21/22 138/79  10/15/22 138/88  09/18/22 (!) 148/84     Physical Exam- Limited  Constitutional:  Body mass index is 28.89 kg/m. , not in acute distress, normal state of mind Eyes:  EOMI, no exophthalmos Musculoskeletal: no gross deformities, strength intact in all four extremities, no gross restriction of joint movements Skin:  no rashes, no hyperemia Neurological: no tremor with outstretched hands   Diabetic Foot Exam - Simple   Simple Foot Form Diabetic Foot exam was performed with the following findings: Yes 10/21/2022  8:53 AM  Visual Inspection No deformities, no ulcerations, no other skin breakdown bilaterally: Yes Sensation Testing Intact to touch and monofilament testing bilaterally: Yes Pulse Check Posterior Tibialis and Dorsalis pulse intact bilaterally: Yes Comments Mild calluses bilaterally     CMP     Component Value Date/Time   NA 141 08/10/2022 1004   NA 143 10/13/2021 0935   K 4.0 08/10/2022 1004   CL 103 08/10/2022 1004   CO2 28 08/10/2022  1004   GLUCOSE 218 (H) 08/10/2022 1004   BUN 17 08/10/2022 1004   BUN 16 10/13/2021 0935   CREATININE 1.35 08/10/2022 1004   CALCIUM 9.3 08/10/2022 1004   PROT 6.5 08/10/2022 1004   PROT 6.1 10/13/2021 0935   ALBUMIN 4.0 10/13/2021 0935   AST 20 08/10/2022 1004   ALT 18 08/10/2022 1004   ALKPHOS 99 10/13/2021 0935   BILITOT 0.9 08/10/2022 1004   BILITOT 0.8 10/13/2021 0935   GFRNONAA 40 (L) 07/24/2020 0932   GFRNONAA 52 (L) 01/23/2020 1002   GFRAA 47 (L) 07/24/2020 0932   GFRAA 60 01/23/2020 1002     Diabetic Labs (most recent): Lab Results  Component Value Date   HGBA1C 7.3 (A) 06/18/2022   HGBA1C 8.1 02/16/2022   HGBA1C 7.3 (A) 10/16/2021   MICROALBUR 80 02/16/2022   MICROALBUR 80 12/16/2020   MICROALBUR 7.3 09/09/2017     Lipid Panel ( most recent) Lipid Panel     Component Value Date/Time   CHOL 163 08/10/2022 1004   CHOL 161 11/20/2020 1017   TRIG 227 (H) 08/10/2022 1004   HDL 47 08/10/2022 1004   HDL 43 11/20/2020 1017   CHOLHDL 3.5 08/10/2022 1004   VLDL 21 07/02/2016 0853   LDLCALC 84 08/10/2022 1004     Assessment & Plan:   1) Type 2 diabetes mellitus with other circulatory complications , and acute on chronic renal insufficiency   His diabetes is complicated by coronary artery disease status post coronary artery bypass graft, acute on chronic renal insufficiency and patient remains at a high risk for more acute and chronic complications of diabetes which include CAD, CVA, CKD, retinopathy, and neuropathy. These are all discussed in detail with the patient.  He presents today with his logs, no meter, showing stable slightly above target fasting glycemic profile.  His POCT A1c today is 7.1%, improving from last visit.  He notes he has recently changed his diet, eating less red meats, more fruits and veggies as he has been having heart problems.   - Recent labs reviewed.   - Nutritional counseling repeated at each appointment due to patients tendency to  fall back in to old habits.  - The patient admits there is a room for improvement in their diet and drink choices. -  Suggestion is made for the patient to avoid simple  carbohydrates from their diet including Cakes, Sweet Desserts / Pastries, Ice Cream, Soda (diet and regular), Sweet Tea, Candies, Chips, Cookies, Sweet Pastries, Store Bought Juices, Alcohol in Excess of 1-2 drinks a day, Artificial Sweeteners, Coffee Creamer, and "Sugar-free" Products. This will help patient to have stable blood glucose profile and potentially avoid unintended weight gain.   - I encouraged the patient to switch to unprocessed or minimally processed complex starch and increased protein intake (animal or plant source), fruits, and vegetables.   - Patient is advised to stick to a routine mealtimes to eat 3 meals a day and avoid unnecessary snacks (to snack only to correct hypoglycemia).  - I have approached patient with the following individualized plan to manage diabetes and patient agrees.  -He is advised to continue his Metformin 500 mg po daily with breakfast.   He does not need to restart Glipizide at this time.  We did talk about possibility of adding on SGLT2i to help with his diabetes, kidney function, and heart function as he has recently been diagnosed with a heart pump problem (will be seeing cardiology soon).  We did talk about the potential side effects of this class of medications.  -I did encourage him to continue monitoring glucose once daily to help keep him on track.   2) BP/HTN:  His blood pressure is controlled to target.  He is advised to continue Lisinopril 5 mg po daily and Metoprolol 12.5 mg po BID.  3) Lipids/HPL:  His most recent lipid panel from 08/10/22 shows controlled LDL of 84 and elevated triglycerides of 227.  He is advised to continue Lovastatin 20 mg po daily at bedtime and fish oil supplement twice daily.  Encouraged him to avoid fried foods and butter.   4) Weight management:   His Body mass index is 28.89 kg/m.-a candidate for modest weight loss.  Detailed carbohydrates and exercise regimen was discussed with him.  5) Chronic Care/Health Maintenance: -Patient is on ACE and Statin medications and encouraged to continue to follow up with Ophthalmology, Podiatrist at least yearly or according to recommendations, and advised to  stay away from smoking. I have recommended yearly flu vaccine and pneumonia vaccination at least every 5 years; moderate intensity exercise for up to 150 minutes weekly; and  sleep for at least 7 hours a day.   - I advised patient to maintain close follow up with Burdine, Ananias Pilgrim, MD for primary care needs.     I spent  35  minutes in the care of the patient today including review of labs from CMP, Lipids, Thyroid Function, Hematology (current and previous including abstractions from other facilities); face-to-face time discussing  his blood glucose readings/logs, discussing hypoglycemia and hyperglycemia episodes and symptoms, medications doses, his options of short and long term treatment based on the latest standards of care / guidelines;  discussion about incorporating lifestyle medicine;  and documenting the encounter. Risk reduction counseling performed per USPSTF guidelines to reduce obesity and cardiovascular risk factors.     Please refer to Patient Instructions for Blood Glucose Monitoring and Insulin/Medications Dosing Guide"  in media tab for additional information. Please  also refer to " Patient Self Inventory" in the Media  tab for reviewed elements of pertinent patient history.  [name] [name] participated in the discussions, expressed understanding, and voiced agreement with the above plans.  All questions were answered to his satisfaction. he is encouraged to contact clinic should he have any questions or concerns prior to his return  visit.   Follow up plan: -Return in about 4 months (around 02/20/2023) for Diabetes F/U with  A1c in office, No previsit labs.    Ronny Bacon, [facility] - [facility] [address] [place], [place] [address] Phone: [phone] Fax: [phone]  10/21/2022, 9:00 AM

## 2022-10-23 ENCOUNTER — Encounter: Payer: Self-pay | Admitting: *Deleted

## 2022-11-11 DIAGNOSIS — H35351 Cystoid macular degeneration, right eye: Secondary | ICD-10-CM | POA: Diagnosis not present

## 2022-11-20 ENCOUNTER — Ambulatory Visit: Payer: Medicare HMO | Attending: Nurse Practitioner | Admitting: Nurse Practitioner

## 2022-11-20 ENCOUNTER — Encounter: Payer: Self-pay | Admitting: Nurse Practitioner

## 2022-11-20 VITALS — BP 120/64 | HR 72 | Ht 74.0 in | Wt 217.2 lb

## 2022-11-20 DIAGNOSIS — G4733 Obstructive sleep apnea (adult) (pediatric): Secondary | ICD-10-CM | POA: Diagnosis not present

## 2022-11-20 DIAGNOSIS — I10 Essential (primary) hypertension: Secondary | ICD-10-CM | POA: Diagnosis not present

## 2022-11-20 DIAGNOSIS — Z79899 Other long term (current) drug therapy: Secondary | ICD-10-CM

## 2022-11-20 DIAGNOSIS — E782 Mixed hyperlipidemia: Secondary | ICD-10-CM

## 2022-11-20 DIAGNOSIS — I2510 Atherosclerotic heart disease of native coronary artery without angina pectoris: Secondary | ICD-10-CM | POA: Diagnosis not present

## 2022-11-20 DIAGNOSIS — I5022 Chronic systolic (congestive) heart failure: Secondary | ICD-10-CM

## 2022-11-20 NOTE — Patient Instructions (Signed)
Medication Instructions:  Your physician recommends that you continue on your current medications as directed. Please refer to the Current Medication list given to you today.  *If you need a refill on your cardiac medications before your next appointment, please call your pharmacy*   Lab Work: Your physician recommends that you return for lab work within the next week.   If you have labs (blood work) drawn today and your tests are completely normal, you will receive your results only by: MyChart Message (if you have MyChart) OR A paper copy in the mail If you have any lab test that is abnormal or we need to change your treatment, we will call you to review the results.   Testing/Procedures: NONE    Follow-Up: At [facility], you and your health needs are our priority.  As part of our continuing mission to provide you with exceptional heart care, we have created designated Provider Care Teams.  These Care Teams include your primary Cardiologist (physician) and Advanced Practice Providers (APPs -  Physician Assistants and Nurse Practitioners) who all work together to provide you with the care you need, when you need it.  We recommend signing up for the patient portal called "MyChart".  Sign up information is provided on this After Visit Summary.  MyChart is used to connect with patients for Virtual Visits (Telemedicine).  Patients are able to view lab/test results, encounter notes, upcoming appointments, etc.  Non-urgent messages can be sent to your provider as well.   To learn more about what you can do with MyChart, go to [email or web address].    Your next appointment:   2 month(s)  Provider:   Sharlene Dory, NP    Other Instructions Thank you for choosing [facility]!

## 2022-11-20 NOTE — Progress Notes (Unsigned)
Cardiology Office Note:    Date: 11/20/2022  ID:  ABRIAN SAIKI, DOB 1951-05-01, MRN [id number]  PCP:  Juliette Alcide, MD   [facility] Providers Cardiologist:  Nona Dell, MD     Referring MD: Juliette Alcide, MD   CC: Here for follow-up  History of Present Illness:    [name] [name] is a 72 y.o. male with a hx of the following:  CAD, s/p CABG x 4 HFmrEF Mixed hyperlipidemia Hypertension Type 2 diabetes OSA on CPAP  Last seen by Dr. Diona Browner on August 27, 2021.  He was doing very well from a cardiac perspective at that time. Dr. Diona Browner recommended planning on follow-up testing around time of next visit.  He recommended possibly starting SGLT2 inhibitor for management of his type 2 diabetes and to allow simplification of medication regimen.    07/2022 - Noted fatigue and DOE -stable per his report.  Lexiscan revealed T wave inversions on EKG, small reversible perfusion defect was noted in mid inferolateral location consistent with ischemia, medium fixed perfusion defect was present in mid to basal inferior location consistent with infarction, EF was noted to be 36%, study was deemed intermediate risk.  Echocardiogram revealed mildly reduced EF at 45 to 50%, no significant valvular abnormalities.  09/2022 - Was doing well. Denied any acute cardiac complaints or issues.  Echocardiogram and Lexiscan results discussed with patient in office. Pt was considering SGLT2i and Zetia was started.   Today he presents for follow-up. Doing well. Denies any chest pain, shortness of breath, palpitations, syncope, presyncope, dizziness, orthopnea, PND, swelling or significant weight changes, acute bleeding, or claudication. Has not started Zetia, as he wants to make sure this is okay to take with his medications. Says his endocrinologist was okay with starting SGLT2i.   SH: Enjoys riding his motorcycle in his free time.   Past Medical History:  Diagnosis Date    Anxiety    Appendiceal tumor    2018 status post hemicolectomy (low-grade neoplasm)   Arthritis    Coronary artery disease    NSTEMI 478295; multivessel CAD s/p post CABG (LIMA-LAD, SVG-D1, SVG-D2, SVG-OM)   Essential hypertension    GERD (gastroesophageal reflux disease)    Gout    History of kidney stones    History of peptic ulcer disease    Hyperlipidemia    Left ureteral stone    Nodule of right lung    CT 04-04-2017  right lower lobe multiple nodules   OSA on CPAP    Renal cyst, right    CT 04-04-2017   Renal insufficiency    Type 2 diabetes mellitus (HCC)    Wears glasses     Past Surgical History:  Procedure Laterality Date   CARDIOVASCULAR STRESS TEST  11-13-2016   dr Diona Browner   Intermediate risk nuclear study w/ medium defect of moderate severity in the basal inferoseptal, basal inferior, mid inferoseptal and mid inferior location (findings consistant w/ prior myocardial infarction)/  mild enlarged LV cavity size;  nuclear stress EF 45%   COLONOSCOPY N/A 04/13/2017   Procedure: COLONOSCOPY;  Surgeon: Franky Macho, MD;  Location: [facility];  Service: Gastroenterology;  Laterality: N/A;   CORONARY ARTERY BYPASS GRAFT N/A 04/25/2013   Procedure: CORONARY ARTERY BYPASS GRAFTING (CABG);  Surgeon: Alleen Borne, MD;  Location: [facility] OR;  Service: Open Heart Surgery;  Laterality: N/A;  Coronary artery bypass graft times four, on pump, using left internal mammary artery and right greater saphenous vein  via endovein harvest.   CYSTOSCOPY WITH RETROGRADE PYELOGRAM, URETEROSCOPY AND STENT PLACEMENT Left 04/08/2017   Procedure: CYSTOSCOPY WITH RETROGRADE PYELOGRAM, URETEROSCOPY,STENT PLACEMENT;  Surgeon: Crista Elliot, MD;  Location: [facility];  Service: Urology;  Laterality: Left;   CYSTOSCOPY/URETEROSCOPY/HOLMIUM LASER/STENT PLACEMENT Left 05/17/2017   Procedure: CYSTOSCOPY / RETROGRADE PYELOGRAM / URETEROSCOPY / HOLMIUM LASER / STONE BASKETRY / STENT  EXCHANGE;  Surgeon: Crista Elliot, MD;  Location: [facility];  Service: Urology;  Laterality: Left;  ONLY NEEDS 45 MIN FOR PROCEDURE   ELBOW BURSA SURGERY Right 03-10-2017     at [facility]   and removal of spur   INTRAOPERATIVE TRANSESOPHAGEAL ECHOCARDIOGRAM N/A 04/25/2013   Procedure: INTRAOPERATIVE TRANSESOPHAGEAL ECHOCARDIOGRAM;  Surgeon: Alleen Borne, MD;  Location: [facility] OR;  Service: Open Heart Surgery;  Laterality: N/A;   KNEE ARTHROSCOPY Left 10/ 2017  approx.    dr Thomasena Edis   LAPAROSCOPIC RIGHT HEMI COLECTOMY Right 04/19/2017   Procedure: OPEN RIGHT HEMI COLECTOMY;  Surgeon: Lucretia Roers, MD;  Location: [facility];  Service: General;  Laterality: Right;   LAPAROSCOPY N/A 04/19/2017   Procedure: LAPAROSCOPY DIAGNOSTIC;  Surgeon: Lucretia Roers, MD;  Location: [facility];  Service: General;  Laterality: N/A;  patient knows to arrive at 6:15   LEFT HEART CATHETERIZATION WITH CORONARY ANGIOGRAM N/A 04/20/2013   Procedure: LEFT HEART CATHETERIZATION WITH CORONARY ANGIOGRAM;  Surgeon: Micheline Chapman, MD;  Location: [facility] CATH LAB;  Service: Cardiovascular;  Laterality: N/A;   NASAL SEPTUM SURGERY  1990s approx   TONSILLECTOMY  34 (age 63)   TRANSTHORACIC ECHOCARDIOGRAM  11-17-2016   dr Diona Browner   mild LVH,  ef 55-60%, LV wall motion & diastolic function indeterminant assessment,  images were inadequate/  mild LAE/  trivial Tr   TYMPANOSTOMY TUBE PLACEMENT      Current Medications: Current Meds  Medication Sig   allopurinol (ZYLOPRIM) 300 MG tablet Take 300 mg by mouth daily.   Ascorbic Acid (VITAMIN C) 1000 MG tablet Take 1,000 mg by mouth daily.   aspirin EC 81 MG tablet Take 1 tablet (81 mg total) by mouth daily. Swallow whole.   escitalopram (LEXAPRO) 10 MG tablet Take 10 mg by mouth at bedtime.    ezetimibe (ZETIA) 10 MG tablet Take 1 tablet (10 mg total) by mouth daily.   lisinopril (PRINIVIL,ZESTRIL) 10 MG tablet Take 5 mg by mouth every morning.    lovastatin  (MEVACOR) 20 MG tablet TAKE 1 TABLET AT BEDTIME   metFORMIN (GLUCOPHAGE) 500 MG tablet TAKE 1 TABLET (500 MG TOTAL) BY MOUTH DAILY.   metoprolol tartrate (LOPRESSOR) 25 MG tablet Take 0.5 tablets (12.5 mg total) by mouth 2 (two) times daily.   Multiple Vitamin (MULTIVITAMIN WITH MINERALS) TABS tablet Take 1 tablet by mouth daily.   Omega-3 Fatty Acids (FISH OIL) 1000 MG CPDR Take 1 capsule by mouth 2 (two) times a day.    omeprazole (PRILOSEC) 20 MG capsule Take 20 mg by mouth every morning.    pyridoxine (B-6) 200 MG tablet Take 200 mg by mouth daily.     Allergies:   Patient has no known allergies.   Social History   Socioeconomic History   Marital status: Married    Spouse name: Not on file   Number of children: Not on file   Years of education: Not on file   Highest education level: Not on file  Occupational History   Occupation: Drives truck    Employer:  [employer]  Tobacco Use   Smoking status: Never   Smokeless tobacco: Never  Vaping Use   Vaping Use: Never used  Substance and Sexual Activity   Alcohol use: No    Alcohol/week: 0.0 standard drinks of alcohol   Drug use: No   Sexual activity: Yes    Birth control/protection: None  Other Topics Concern   Not on file  Social History Narrative   Lives with wife, does not exercise but part of his job is strenuous. Works in a body-shop part-time, too.   2 of his mother's sisters and 1 brother died suddenly (family was told MI but no autopsy).    Social Determinants of Health   Financial Resource Strain: Low Risk  (05/11/2022)   Overall Financial Resource Strain (CARDIA)    Difficulty of Paying Living Expenses: Not hard at all  Food Insecurity: No Food Insecurity (03/05/2022)   Hunger Vital Sign    Worried About Running Out of Food in the Last Year: Never true    Ran Out of Food in the Last Year: Never true  Transportation Needs: No Transportation Needs (05/11/2022)   PRAPARE - Scientist, research (physical sciences) (Medical): No    Lack of Transportation (Non-Medical): No  Physical Activity: Not on file  Stress: Not on file  Social Connections: Not on file     Family History: The patient's family history includes CAD in his father and mother; Colon cancer in his father; Lung cancer in his father.  ROS:   Please see the history of present illness.    All other systems reviewed and are negative.  EKGs/Labs/Other Studies Reviewed:    The following studies were reviewed today:   EKG:  EKG is not ordered today.   Echocardiogram on August 25, 2022:  1. Left ventricular ejection fraction, by estimation, is 45 to 50%. The  left ventricle has mildly decreased function. The left ventricle has no  regional wall motion abnormalities. Left ventricular diastolic parameters  are indeterminate.   2. Right ventricular systolic function is mildly reduced. The right  ventricular size is normal.   3. Left atrial size was mildly dilated.   4. The mitral valve is normal in structure. Trivial mitral valve  regurgitation. No evidence of mitral stenosis.   5. The aortic valve is tricuspid. There is mild calcification of the  aortic valve. Aortic valve regurgitation is not visualized. No aortic  stenosis is present.   Comparison(s): No prior Echocardiogram.    Lexiscan on August 12, 2022:   Pharmacological stress ECG is negative for ischemia. Rest and stress ECG positive for diffuse T wave inversions, 1 mm. No chest pain during the test.   LV perfusion is abnormal. There is a small reversible perfusion defect present in the mid inferolateral location consistent with ischemia. There is a medium fixed perfusion defect present in the mid to basal inferior location consistent with infarction.   Nuclear stress EF: 36 %. The left ventricular ejection fraction is moderately decreased (30-44%). End diastolic cavity size is moderately enlarged. End systolic cavity size is moderately enlarged.    Findings are consistent with ischemia and infarction. The study is intermediate risk.  Echocardiogram on Nov 17, 2016: Study Conclusions   - Left ventricle: The cavity size was normal. Wall thickness was    increased in a pattern of mild LVH. Systolic function was normal.    The estimated ejection fraction was in the range of 55% to 60%.  -  Aortic valve: Mildly calcified annulus. Mildly thickened, mildly    calcified leaflets. Valve area (VTI): 3.22 cm^2. Valve area    (Vmax): 3.3 cm^2.  - Left atrium: The atrium was mildly dilated.  - Technically adequate study.  Lexiscan on Nov 13, 2016: There was no ST segment deviation noted during stress. Defect 1: There is a medium defect of moderate severity present in the basal inferoseptal, basal inferior, mid inferoseptal and mid inferior location. Findings consistent with prior myocardial infarction. This is an intermediate risk study. Nuclear stress EF: 45%.  Recent Labs: 08/10/2022: ALT 18; BUN 17; Creat 1.35; Hemoglobin 14.6; Magnesium 1.8; Platelets 160; Potassium 4.0; Sodium 141; TSH 1.33  Recent Lipid Panel    Component Value Date/Time   CHOL 163 08/10/2022 1004   CHOL 161 11/20/2020 1017   TRIG 227 (H) 08/10/2022 1004   HDL 47 08/10/2022 1004   HDL 43 11/20/2020 1017   CHOLHDL 3.5 08/10/2022 1004   VLDL 21 07/02/2016 0853   LDLCALC 84 08/10/2022 1004    Physical Exam:    VS:  BP 120/64   Pulse 72   Ht 6\' 2"  (1.88 m)   Wt 217 lb 3.2 oz (98.5 kg)   SpO2 95%   BMI 27.89 kg/m     Wt Readings from Last 3 Encounters:  11/20/22 217 lb 3.2 oz (98.5 kg)  10/21/22 219 lb (99.3 kg)  10/15/22 217 lb (98.4 kg)     GEN: Well nourished, well developed in no acute distress HEENT: Normal NECK: No JVD; No carotid bruits CARDIAC: S1/S2, RRR, no murmurs, rubs, gallops; 2+ pulses RESPIRATORY:  Clear to auscultation without rales, wheezing or rhonchi  MUSCULOSKELETAL:  No edema; No deformity  SKIN: Warm and dry NEUROLOGIC:  Alert  and oriented x 3 PSYCHIATRIC:  Normal affect   ASSESSMENT:    1. Heart failure with mildly reduced ejection fraction (HFmrEF) (HCC)   2. Medication management   3. Coronary artery disease involving native heart without angina pectoris, unspecified vessel or lesion type   4. Mixed hyperlipidemia   5. Essential hypertension, benign   6. OSA (obstructive sleep apnea)     PLAN:    In order of problems listed above:  1. HFmrEF, medication management TTE revealed EF 45 to 50%.  Continue aspirin, lisinopril, lovastatin, and Lopressor. Obtaining BMET and if kidney function permits, will begin SGLT2i. Low sodium diet, fluid restriction <2L, and daily weights encouraged. Educated to contact our office for weight gain of 2 lbs overnight or 5 lbs in one week.   2. CAD, s/p CABG x 4 No chest pain.  NST 07/2022 was deemed intermediate risk, findings consistent with ischemia and infarction.  Hx of microvascular CAD seen on cath, was not able to do intervention.  TTE 08/2022 revealed mildly reduced EF.  Continue aspirin, lisinopril, lovastatin, and Lopressor.  Discussed to start Zetia. Heart healthy diet and regular cardiovascular exercise encouraged. ED precautions discussed.    3. Mixed hyperlipidemia Previous LDL was 116.  Continue Lovastatin.  Discussed to start Zetia.  Heart healthy diet and regular cardiovascular exercise encouraged.  At next visit, plan to repeat FLP and LFT.  4. Hypertension Blood pressure stable today.  BP well-controlled at home.  Discussed to monitor BP at home at least 2 hours after medications and sitting for 5-10 minutes. Continue lisinopril and Lopressor. Heart healthy diet and regular cardiovascular exercise encouraged.    5. OSA Previously referred to pulmonology.  Sees Dr. Vassie Loll.  Heart healthy diet  and regular cardiovascular exercise encouraged.   6. Disposition: Follow-up with me or APP in 2 months or sooner if anything changes.  Medication Adjustments/Labs and  Tests Ordered: Current medicines are reviewed at length with the patient today.  Concerns regarding medicines are outlined above.  Orders Placed This Encounter  Procedures   Basic Metabolic Panel (BMET)   No orders of the defined types were placed in this encounter.   Patient Instructions  Medication Instructions:  Your physician recommends that you continue on your current medications as directed. Please refer to the Current Medication list given to you today.  *If you need a refill on your cardiac medications before your next appointment, please call your pharmacy*   Lab Work: Your physician recommends that you return for lab work within the next week.   If you have labs (blood work) drawn today and your tests are completely normal, you will receive your results only by: MyChart Message (if you have MyChart) OR A paper copy in the mail If you have any lab test that is abnormal or we need to change your treatment, we will call you to review the results.   Testing/Procedures: NONE    Follow-Up: At [facility], you and your health needs are our priority.  As part of our continuing mission to provide you with exceptional heart care, we have created designated Provider Care Teams.  These Care Teams include your primary Cardiologist (physician) and Advanced Practice Providers (APPs -  Physician Assistants and Nurse Practitioners) who all work together to provide you with the care you need, when you need it.  We recommend signing up for the patient portal called "MyChart".  Sign up information is provided on this After Visit Summary.  MyChart is used to connect with patients for Virtual Visits (Telemedicine).  Patients are able to view lab/test results, encounter notes, upcoming appointments, etc.  Non-urgent messages can be sent to your provider as well.   To learn more about what you can do with MyChart, go to [email or web address].    Your next appointment:   2  month(s)  Provider:   Sharlene Dory, NP    Other Instructions Thank you for choosing [facility]!      SignedSharlene Dory, NP  11/22/2022 9:41 PM    [facility]

## 2022-11-24 DIAGNOSIS — Z79899 Other long term (current) drug therapy: Secondary | ICD-10-CM | POA: Diagnosis not present

## 2022-11-24 DIAGNOSIS — E1142 Type 2 diabetes mellitus with diabetic polyneuropathy: Secondary | ICD-10-CM | POA: Diagnosis not present

## 2022-11-24 DIAGNOSIS — L603 Nail dystrophy: Secondary | ICD-10-CM | POA: Diagnosis not present

## 2022-11-24 DIAGNOSIS — L84 Corns and callosities: Secondary | ICD-10-CM | POA: Diagnosis not present

## 2022-11-24 LAB — BASIC METABOLIC PANEL
BUN: 15 mg/dL (ref 7–25)
CO2: 28 mmol/L (ref 20–32)
Calcium: 9.5 mg/dL (ref 8.6–10.3)
Chloride: 106 mmol/L (ref 98–110)
Creat: 1.25 mg/dL (ref 0.70–1.35)
Glucose, Bld: 166 mg/dL — ABNORMAL HIGH (ref 65–99)
Potassium: 4.6 mmol/L (ref 3.5–5.3)
Sodium: 142 mmol/L (ref 135–146)

## 2022-11-30 ENCOUNTER — Telehealth: Payer: Self-pay | Admitting: *Deleted

## 2022-11-30 ENCOUNTER — Other Ambulatory Visit: Payer: Self-pay | Admitting: *Deleted

## 2022-11-30 DIAGNOSIS — Z79899 Other long term (current) drug therapy: Secondary | ICD-10-CM

## 2022-11-30 DIAGNOSIS — I5022 Chronic systolic (congestive) heart failure: Secondary | ICD-10-CM

## 2022-11-30 MED ORDER — EMPAGLIFLOZIN 10 MG PO TABS: 10.0000 mg | ORAL_TABLET | Freq: Every day | ORAL | 6 refills | Status: DC

## 2022-11-30 NOTE — Telephone Encounter (Signed)
-----   Message from Sharlene Dory, NP sent at 11/29/2022  5:45 PM EDT ----- Kidney function looks good. Please start Marcelline Deist or Jardiance for a diagnosis of heart failure with mildly reduced ejection fraction, whichever one is better for cost.   Then, we will need to repeat a BMET within 1 week after starting this medication. Follow-up as scheduled.   Thanks!  Sharlene Dory, AGNP-C

## 2022-11-30 NOTE — Telephone Encounter (Signed)
Patient informed and verbalized understanding of plan. Copy sent to PCP. Lab order faxed to [facility].  

## 2022-12-03 ENCOUNTER — Telehealth: Payer: Self-pay | Admitting: *Deleted

## 2022-12-03 NOTE — Telephone Encounter (Signed)
  Procedure: Colonoscopy  Height: 6'1 Weight: 210lbs        Have you had a colonoscopy before?  04/13/17 Dr. Lovell Sheehan  Do you have family history of colon cancer?  Yes father  Do you have a family history of polyps? tes  Previous colonoscopy with polyps removed? yes  Do you have a history colorectal cancer?   no  Are you diabetic?  Yes type 2  Do you have a prosthetic or mechanical heart valve? no  Do you have a pacemaker/defibrillator?   no  Have you had endocarditis/atrial fibrillation?  no  Do you use supplemental oxygen/CPAP?  yes cpap  Have you had joint replacement within the last 12 months?  no  Do you tend to be constipated or have to use laxatives?  no   Do you have history of alcohol use? If yes, how much and how often.  no  Do you have history or are you using drugs? If yes, what do are you  using?  no  Have you ever had a stroke/heart attack?  Yes 10, 2014  Have you ever had a heart or other vascular stent placed,?no  Do you take weight loss medication? no  Do you take any blood-thinning medications such as: (Plavix, aspirin, Coumadin, Aggrenox, Brilinta, Xarelto, Eliquis, Pradaxa, Savaysa or Effient)? yes If yes we need the name, milligram, dosage and who is prescribing doctor:  aspirin 81mg  daily, Dr. Diona Browner             Current Outpatient Medications  Medication Sig Dispense Refill   allopurinol (ZYLOPRIM) 300 MG tablet Take 300 mg by mouth daily.     Ascorbic Acid (VITAMIN C) 1000 MG tablet Take 1,000 mg by mouth daily.     aspirin EC 81 MG tablet Take 1 tablet (81 mg total) by mouth daily. Swallow whole. 90 tablet 3   Blood Glucose Monitoring Suppl (ONE TOUCH ULTRA 2) w/Device KIT USE TO TEST BLOOD ONCE A DAY 1 kit 0   empagliflozin (JARDIANCE) 10 MG TABS tablet Take 1 tablet (10 mg total) by mouth daily before breakfast. 30 tablet 6   escitalopram (LEXAPRO) 10 MG tablet Take 10 mg by mouth at bedtime.      ezetimibe (ZETIA) 10 MG tablet Take 1 tablet  (10 mg total) by mouth daily. 90 tablet 1   glucose blood (ONETOUCH ULTRA) test strip Use as instructed to monitor glucose once daily 100 strip 2   Lancets (ONETOUCH ULTRASOFT) lancets Use as instructed to monitor glucose once daily 100 each 12   lisinopril (PRINIVIL,ZESTRIL) 10 MG tablet Take 5 mg by mouth every morning.      lovastatin (MEVACOR) 20 MG tablet TAKE 1 TABLET AT BEDTIME 90 tablet 1   metFORMIN (GLUCOPHAGE) 500 MG tablet TAKE 1 TABLET (500 MG TOTAL) BY MOUTH DAILY. 90 tablet 1   metoprolol tartrate (LOPRESSOR) 25 MG tablet Take 0.5 tablets (12.5 mg total) by mouth 2 (two) times daily. 90 tablet 1   Multiple Vitamin (MULTIVITAMIN WITH MINERALS) TABS tablet Take 1 tablet by mouth daily.     Omega-3 Fatty Acids (FISH OIL) 1000 MG CPDR Take 1 capsule by mouth 2 (two) times a day.      omeprazole (PRILOSEC) 20 MG capsule Take 20 mg by mouth every morning.      pyridoxine (B-6) 200 MG tablet Take 200 mg by mouth daily.     No current facility-administered medications for this visit.    No Known Allergies

## 2022-12-21 NOTE — Telephone Encounter (Signed)
Probably best to have office visit prior. ASA 3. History of HF, CAD, CABG in past.

## 2022-12-23 ENCOUNTER — Encounter: Payer: Self-pay | Admitting: *Deleted

## 2023-01-04 NOTE — Progress Notes (Unsigned)
Referring Provider: Juliette Alcide, MD Primary Care Physician:  Juliette Alcide, MD Primary Gastroenterologist:  Dr. Jena Gauss  Chief Complaint  Patient presents with   Colonoscopy    Pt needs office visit prior to being scheduled.    HPI:   [name] [name] is a 72 y.o. male presenting today at the request of Burdine, Ananias Pilgrim, MD for consult colonoscopy due to father with history of colon cancer.   Last colonoscopy October 2018 with entirely normal exam.    Patient's past medical history is significant for CAD/s/p CABG, heart failure with EF 45-50%, type 2 diabetes, HTN, HLD.   Today:  Reports he is doing well overall.  No GI concerns.  Denies constipation, diarrhea, abdominal pain, unintentional weight loss.  Notes scant toilet tissue hematochezia only if wiping really hard.  This has been present for a long time without change.  Otherwise, no rectal bleeding or melena.  Reflux is well-controlled on omeprazole.  No dysphagia, nausea, vomiting.  Denies any family history of colon cancer.  Discussed that it was marked in his chart that his father had colon cancer, but he denies this.  States his father had lung cancer.   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-grade neoplasm)   Arthritis    Coronary artery disease    NSTEMI [id number]; multivessel CAD s/p post CABG (LIMA-LAD, SVG-D1, SVG-D2, SVG-OM)   Essential hypertension    GERD (gastroesophageal reflux disease)    Gout    History of kidney stones    History of peptic ulcer disease    Hyperlipidemia    Left ureteral stone    Nodule of right lung    CT 04-04-2017  right lower lobe multiple nodules   OSA on CPAP    Renal cyst, right    CT 04-04-2017   Renal insufficiency    Type 2 diabetes mellitus (HCC)    Wears glasses     Past Surgical History:  Procedure Laterality Date   CARDIOVASCULAR STRESS TEST  11-13-2016   dr Diona Browner   Intermediate  risk nuclear study w/ medium defect of moderate severity in the basal inferoseptal, basal inferior, mid inferoseptal and mid inferior location (findings consistant w/ prior myocardial infarction)/  mild enlarged LV cavity size;  nuclear stress EF 45%   COLONOSCOPY N/A 04/13/2017   Procedure: COLONOSCOPY;  Surgeon: Franky Macho, MD;  Location: [facility];  Service: Gastroenterology;  Laterality: N/A;   CORONARY ARTERY BYPASS GRAFT N/A 04/25/2013   Procedure: CORONARY ARTERY BYPASS GRAFTING (CABG);  Surgeon: Alleen Borne, MD;  Location: [facility] OR;  Service: Open Heart Surgery;  Laterality: N/A;  Coronary artery bypass graft times four, on pump, using left internal mammary artery and right greater saphenous vein via endovein harvest.   CYSTOSCOPY WITH RETROGRADE PYELOGRAM, URETEROSCOPY AND STENT PLACEMENT Left 04/08/2017   Procedure: CYSTOSCOPY WITH RETROGRADE PYELOGRAM, URETEROSCOPY,STENT PLACEMENT;  Surgeon: Crista Elliot, MD;  Location: [facility];  Service: Urology;  Laterality: Left;   CYSTOSCOPY/URETEROSCOPY/HOLMIUM LASER/STENT PLACEMENT Left 05/17/2017   Procedure: CYSTOSCOPY / RETROGRADE PYELOGRAM / URETEROSCOPY / HOLMIUM LASER / STONE BASKETRY / STENT EXCHANGE;  Surgeon: Crista Elliot, MD;  Location: [facility] ;  Service: Urology;  Laterality: Left;  ONLY NEEDS 45 MIN FOR PROCEDURE   ELBOW BURSA SURGERY Right 03-10-2017     at [facility]   and removal of spur  INTRAOPERATIVE TRANSESOPHAGEAL ECHOCARDIOGRAM N/A 04/25/2013   Procedure: INTRAOPERATIVE TRANSESOPHAGEAL ECHOCARDIOGRAM;  Surgeon: Alleen Borne, MD;  Location: [facility] OR;  Service: Open Heart Surgery;  Laterality: N/A;   KNEE ARTHROSCOPY Left 10/ 2017  approx.    dr Thomasena Edis   LAPAROSCOPIC RIGHT HEMI COLECTOMY Right 04/19/2017   Procedure: OPEN RIGHT HEMI COLECTOMY;  Surgeon: Lucretia Roers, MD;  Location: [facility];  Service: General;  Laterality: Right;   LAPAROSCOPY N/A 04/19/2017   Procedure:  LAPAROSCOPY DIAGNOSTIC;  Surgeon: Lucretia Roers, MD;  Location: [facility];  Service: General;  Laterality: N/A;  patient knows to arrive at 6:15   LEFT HEART CATHETERIZATION WITH CORONARY ANGIOGRAM N/A 04/20/2013   Procedure: LEFT HEART CATHETERIZATION WITH CORONARY ANGIOGRAM;  Surgeon: Micheline Chapman, MD;  Location: [facility] CATH LAB;  Service: Cardiovascular;  Laterality: N/A;   NASAL SEPTUM SURGERY  1990s approx   TONSILLECTOMY  67 (age 75)   TRANSTHORACIC ECHOCARDIOGRAM  11-17-2016   dr Diona Browner   mild LVH,  ef 55-60%, LV wall motion & diastolic function indeterminant assessment,  images were inadequate/  mild LAE/  trivial Tr   TYMPANOSTOMY TUBE PLACEMENT      Current Outpatient Medications  Medication Sig Dispense Refill   allopurinol (ZYLOPRIM) 300 MG tablet Take 300 mg by mouth daily.     Ascorbic Acid (VITAMIN C) 1000 MG tablet Take 1,000 mg by mouth daily.     aspirin EC 81 MG tablet Take 1 tablet (81 mg total) by mouth daily. Swallow whole. 90 tablet 3   Blood Glucose Monitoring Suppl (ONE TOUCH ULTRA 2) w/Device KIT USE TO TEST BLOOD ONCE A DAY 1 kit 0   empagliflozin (JARDIANCE) 10 MG TABS tablet Take 1 tablet (10 mg total) by mouth daily before breakfast. 30 tablet 6   escitalopram (LEXAPRO) 10 MG tablet Take 10 mg by mouth at bedtime.      ezetimibe (ZETIA) 10 MG tablet Take 1 tablet (10 mg total) by mouth daily. 90 tablet 1   glucose blood (ONETOUCH ULTRA) test strip Use as instructed to monitor glucose once daily 100 strip 2   Lancets (ONETOUCH ULTRASOFT) lancets Use as instructed to monitor glucose once daily 100 each 12   lisinopril (PRINIVIL,ZESTRIL) 10 MG tablet Take 5 mg by mouth every morning.      lovastatin (MEVACOR) 20 MG tablet TAKE 1 TABLET AT BEDTIME 90 tablet 1   metFORMIN (GLUCOPHAGE) 500 MG tablet TAKE 1 TABLET (500 MG TOTAL) BY MOUTH DAILY. 90 tablet 1   metoprolol tartrate (LOPRESSOR) 25 MG tablet Take 0.5 tablets (12.5 mg total) by mouth 2 (two) times daily.  90 tablet 1   Multiple Vitamin (MULTIVITAMIN WITH MINERALS) TABS tablet Take 1 tablet by mouth daily.     Omega-3 Fatty Acids (FISH OIL) 1000 MG CPDR Take 1 capsule by mouth 2 (two) times a day.      omeprazole (PRILOSEC) 20 MG capsule Take 20 mg by mouth every morning.      pyridoxine (B-6) 200 MG tablet Take 200 mg by mouth daily.     No current facility-administered medications for this visit.    Allergies as of 01/07/2023   (No Known Allergies)    Family History  Problem Relation Age of Onset   CAD Mother    CAD Father    Lung cancer Father     Social History   Socioeconomic History   Marital status: Married    Spouse name: Not on file  Number of children: Not on file   Years of education: Not on file   Highest education level: Not on file  Occupational History   Occupation: Drives truck    Employer: [employer]  Tobacco Use   Smoking status: Never   Smokeless tobacco: Never  Vaping Use   Vaping Use: Never used  Substance and Sexual Activity   Alcohol use: No    Alcohol/week: 0.0 standard drinks of alcohol   Drug use: No   Sexual activity: Yes    Birth control/protection: None  Other Topics Concern   Not on file  Social History Narrative   Lives with wife, does not exercise but part of his job is strenuous. Works in a body-shop part-time, too.   2 of his mother's sisters and 1 brother died suddenly (family was told MI but no autopsy).    Social Determinants of Health   Financial Resource Strain: Low Risk  (05/11/2022)   Overall Financial Resource Strain (CARDIA)    Difficulty of Paying Living Expenses: Not hard at all  Food Insecurity: No Food Insecurity (03/05/2022)   Hunger Vital Sign    Worried About Running Out of Food in the Last Year: Never true    Ran Out of Food in the Last Year: Never true  Transportation Needs: No Transportation Needs (05/11/2022)   PRAPARE - Administrator, Civil Service (Medical): No    Lack of Transportation  (Non-Medical): No  Physical Activity: Not on file  Stress: Not on file  Social Connections: Not on file  Intimate Partner Violence: Not on file    Review of Systems: Gen: Denies any fever, chills, cold or flulike symptoms, presyncope, syncope. CV: Denies chest pain, heart palpitations. Resp: Denies shortness of breath, cough. GI: See HPI GU : Denies urinary burning, urinary frequency, urinary hesitancy MS: Denies joint pain. Derm: Denies rash. Psych: Denies depression, anxiety. Heme: See HPI  Physical Exam: BP 127/66 (BP Location: Left Arm, Patient Position: Sitting, Cuff Size: Large)   Pulse (!) 54   Temp (!) 97.5 F (36.4 C) (Temporal)   Ht 6\' 2"  (1.88 m)   Wt 217 lb 12.8 oz (98.8 kg)   SpO2 97%   BMI 27.96 kg/m  General:   Alert and oriented. Pleasant and cooperative. Well-nourished and well-developed.  Head:  Normocephalic and atraumatic. Eyes:  Without icterus, sclera clear and conjunctiva pink.  Ears:  Normal auditory acuity. Lungs:  Clear to auscultation bilaterally. No wheezes, rales, or rhonchi. No distress.  Heart:  S1, S2 present without murmurs appreciated.  Abdomen:  +BS, soft, non-tender and non-distended. No HSM noted. No guarding or rebound. Soft, reducible, periumbilical hernia.  Rectal:  Deferred  Msk:  Symmetrical without gross deformities. Normal posture. Extremities:  Without edema. Neurologic:  Alert and  oriented x4;  grossly normal neurologically. Skin:  Intact without significant lesions or rashes. Psych:  Alert and cooperative. Normal mood and affect.    Assessment:  72 year old male with history of CAD s/p CABG, heart failure with reduced EF, type 2 diabetes, HTN, HLD, appendiceal tumor s/p right hemicolectomy, presenting today at the request of Dr. Leandrew Koyanagi to discuss scheduling colonoscopy due to father with history of colon cancer.  Patient's last colonoscopy was in 2018 with entirely normal exam, recommended 10-year repeat at that time.   Discussed that it is marked in his chart that his father has colon cancer, but patient denies this.  States his father had lung cancer.  He does not currently  have any significant GI symptoms.  Reports chronic intermittent scant toilet tissue hematochezia only if wiping hard.  This has not changed in many years and is not interested in working this up any further.  He will be due for routine screening colonoscopy in 2028.    Plan:  Place on recall for colonoscopy in 2028.  Follow-up prn.    Ermalinda Memos, PA-C [facility] 01/07/2023

## 2023-01-07 ENCOUNTER — Encounter: Payer: Self-pay | Admitting: Gastroenterology

## 2023-01-07 ENCOUNTER — Ambulatory Visit: Payer: Medicare HMO | Admitting: Gastroenterology

## 2023-01-07 ENCOUNTER — Telehealth: Payer: Self-pay | Admitting: Gastroenterology

## 2023-01-07 VITALS — BP 127/66 | HR 54 | Temp 97.5°F | Ht 74.0 in | Wt 217.8 lb

## 2023-01-07 DIAGNOSIS — Z1211 Encounter for screening for malignant neoplasm of colon: Secondary | ICD-10-CM | POA: Diagnosis not present

## 2023-01-07 NOTE — Patient Instructions (Signed)
Your last colonoscopy was entirely normal in 2018.  Your next colonoscopy will be in 2028.  Please let me know if you find out that you have any first-degree relatives with colon cancer as this would change the interval to when you need your next colonoscopy.  It was very nice to meet you today!  Ermalinda Memos, PA-C [facility]

## 2023-01-07 NOTE — Telephone Encounter (Signed)
Patient came back to the office after his visit letting us know that he found out that his father did have colon cancer in his 50s.  We will go ahead and get him scheduled for colonoscopy.  Please arrange colonoscopy with propofol with Dr. Jena Gauss. Dx: Family history of colon cancer ASA 3. Hold Jardiance x 3 days prior to procedure. Day of procedure: No morning diabetes medications.

## 2023-01-07 NOTE — Telephone Encounter (Signed)
Will call pt to schedule once we have future schedule 

## 2023-01-11 DIAGNOSIS — H35033 Hypertensive retinopathy, bilateral: Secondary | ICD-10-CM | POA: Diagnosis not present

## 2023-01-11 DIAGNOSIS — H524 Presbyopia: Secondary | ICD-10-CM | POA: Diagnosis not present

## 2023-01-21 ENCOUNTER — Ambulatory Visit: Payer: Medicare HMO | Attending: Nurse Practitioner | Admitting: Nurse Practitioner

## 2023-01-21 VITALS — BP 102/70 | HR 62 | Ht 74.0 in | Wt 216.4 lb

## 2023-01-21 DIAGNOSIS — I2510 Atherosclerotic heart disease of native coronary artery without angina pectoris: Secondary | ICD-10-CM | POA: Diagnosis not present

## 2023-01-21 DIAGNOSIS — I5022 Chronic systolic (congestive) heart failure: Secondary | ICD-10-CM

## 2023-01-21 DIAGNOSIS — I10 Essential (primary) hypertension: Secondary | ICD-10-CM | POA: Diagnosis not present

## 2023-01-21 DIAGNOSIS — E782 Mixed hyperlipidemia: Secondary | ICD-10-CM | POA: Diagnosis not present

## 2023-01-21 DIAGNOSIS — Z79899 Other long term (current) drug therapy: Secondary | ICD-10-CM | POA: Diagnosis not present

## 2023-01-21 DIAGNOSIS — G4733 Obstructive sleep apnea (adult) (pediatric): Secondary | ICD-10-CM | POA: Diagnosis not present

## 2023-01-21 DIAGNOSIS — R42 Dizziness and giddiness: Secondary | ICD-10-CM | POA: Diagnosis not present

## 2023-01-21 MED ORDER — LISINOPRIL 2.5 MG PO TABS: 2.5000 mg | ORAL_TABLET | Freq: Every day | ORAL | 3 refills | Status: DC

## 2023-01-21 NOTE — Patient Instructions (Addendum)
Medication Instructions:  Your physician has recommended you make the following change in your medication:  For 1 week Hold Metoprolol Tartrate /Lopressor  Start taking Lisinopril 2.5 Mg daily Continue all other medications asa prescribed.   Labwork: BMET, in 1 [facility] at [facility]   Testing/Procedures: none  Follow-Up: Your physician recommends that you schedule a follow-up appointment in: 6-8 weeks with Philis Nettle   Any Other Special Instructions Will Be Listed Below (If Applicable).  If you need a refill on your cardiac medications before your next appointment, please call your pharmacy.

## 2023-01-21 NOTE — Progress Notes (Addendum)
Cardiology Office Note:  .   Date:  01/22/2023  ID:  [name] [name], DOB November 09, 1950, MRN [id number] PCP: Juliette Alcide, MD  [facility] Providers Cardiologist:  Nona Dell, MD    History of Present Illness: .   [name] [name] is a 72 y.o. male with a PMH of CAD, s/p CABG x4, HFmrEF, mixed HLD, HTN, T2DM, OSA on CPAP, who presents today for follow-up.   Last saw patient on Nov 20, 2022. Was doing well at the time. Patient was started on Jardiance.   Today he presents for follow-up. He states he is doing well. Denies any chest pain, shortness of breath, palpitations, syncope, presyncope, orthopnea, PND, swelling or significant weight changes, acute bleeding, or claudication. Does admit to occasional dizziness when standing up.   SH: Works at [employer]. Enjoys riding his motorcycle in his free time.   Studies Reviewed: .    Echocardiogram on August 25, 2022:  1. Left ventricular ejection fraction, by estimation, is 45 to 50%. The  left ventricle has mildly decreased function. The left ventricle has no  regional wall motion abnormalities. Left ventricular diastolic parameters  are indeterminate.   2. Right ventricular systolic function is mildly reduced. The right  ventricular size is normal.   3. Left atrial size was mildly dilated.   4. The mitral valve is normal in structure. Trivial mitral valve  regurgitation. No evidence of mitral stenosis.   5. The aortic valve is tricuspid. There is mild calcification of the  aortic valve. Aortic valve regurgitation is not visualized. No aortic  stenosis is present.   Comparison(s): No prior Echocardiogram.      Lexiscan on August 12, 2022:   Pharmacological stress ECG is negative for ischemia. Rest and stress ECG positive for diffuse T wave inversions, 1 mm. No chest pain during the test.   LV perfusion is abnormal. There is a small reversible perfusion defect present in the mid inferolateral location  consistent with ischemia. There is a medium fixed perfusion defect present in the mid to basal inferior location consistent with infarction.   Nuclear stress EF: 36 %. The left ventricular ejection fraction is moderately decreased (30-44%). End diastolic cavity size is moderately enlarged. End systolic cavity size is moderately enlarged.   Findings are consistent with ischemia and infarction. The study is intermediate risk.     Physical Exam:   VS:  BP 102/70   Pulse 62   Ht 6\' 2"  (1.88 m)   Wt 216 lb 6.4 oz (98.2 kg)   SpO2 98%   BMI 27.78 kg/m    Wt Readings from Last 3 Encounters:  01/21/23 216 lb 6.4 oz (98.2 kg)  01/07/23 217 lb 12.8 oz (98.8 kg)  11/20/22 217 lb 3.2 oz (98.5 kg)    GEN: Well nourished, well developed in no acute distress NECK: No JVD; No carotid bruits CARDIAC: S1/S2, RRR, no murmurs, rubs, gallops RESPIRATORY:  Clear to auscultation without rales, wheezing or rhonchi  ABDOMEN: Soft, non-tender, non-distended EXTREMITIES:  No edema; No deformity   ASSESSMENT AND PLAN: .   1. HFmrEF, medication management Stage C, NYHA class I symptoms. TTE revealed EF 45 to 50%. Euvolemic and well compensated on exam. Will reduce lisinopril to 2.5 mg daily to improve orthostatic dizziness and hold Lopressor x 1 week. Continue Jardiance and rest of GDMT. GDMT limited d/t current BP. Will obtain BMET in 1 week - previously did not get completed after starting Jardiance. Low sodium  diet, fluid restriction <2L, and daily weights encouraged. Educated to contact our office for weight gain of 2 lbs overnight or 5 lbs in one week.    2. CAD, s/p CABG x 4 No chest pain.  NST 07/2022 was deemed intermediate risk, findings consistent with ischemia and infarction.  Hx of microvascular CAD seen on cath, was not able to do intervention.  TTE 08/2022 revealed mildly reduced EF.  Continue aspirin, Zetia, lovastatin, and will hold Lopressor and reduce lisinopril as mentioned above. Heart healthy diet  and regular cardiovascular exercise encouraged. ED precautions discussed.     3. Mixed hyperlipidemia Previous LDL was 116.  Continue Lovastatin and Zetia.  Heart healthy diet and regular cardiovascular exercise encouraged.  At next visit, plan to repeat FLP and LFT.   4. Hypertension, orthostatic dizziness Blood pressure soft today.  BP well-controlled at home.  Discussed to monitor BP at home at least 2 hours after medications and sitting for 5-10 minutes. Will reduce lisinopril and discussed to hold Lopressor x 1 week and call to let us know if his symptoms improve. If symptoms do not improve after holding medication, plan to arrange orthostatics at next OV and start Toprol XL 12.5 mg daily, and reduce Aldactone in half, encouraged him to stay well hydrated. Heart healthy diet and regular cardiovascular exercise encouraged.     5. OSA Previously referred to pulmonology.  Sees Dr. Vassie Loll.  Heart healthy diet and regular cardiovascular exercise encouraged.   Dispo: Follow-up with me or APP in 6-8 weeks or sooner if anything changes.   Signed, Sharlene Dory, NP

## 2023-01-26 ENCOUNTER — Other Ambulatory Visit: Payer: Self-pay | Admitting: *Deleted

## 2023-01-26 ENCOUNTER — Encounter: Payer: Self-pay | Admitting: *Deleted

## 2023-01-26 MED ORDER — NA SULFATE-K SULFATE-MG SULF 17.5-3.13-1.6 GM/177ML PO SOLN: ORAL | 0 refills | Status: DC

## 2023-01-26 NOTE — Telephone Encounter (Signed)
LMTCB with spouse to call back

## 2023-01-26 NOTE — Telephone Encounter (Signed)
Pt has been scheduled for 03/03/23. Instructions mailed and prep sent to the pharmacy,

## 2023-01-27 ENCOUNTER — Encounter: Payer: Self-pay | Admitting: *Deleted

## 2023-02-01 DIAGNOSIS — I5022 Chronic systolic (congestive) heart failure: Secondary | ICD-10-CM | POA: Diagnosis not present

## 2023-02-02 LAB — BASIC METABOLIC PANEL
BUN/Creatinine Ratio: 18 (calc) (ref 6–22)
BUN: 23 mg/dL (ref 7–25)
CO2: 25 mmol/L (ref 20–32)
Calcium: 9.7 mg/dL (ref 8.6–10.3)
Chloride: 103 mmol/L (ref 98–110)
Creat: 1.29 mg/dL — ABNORMAL HIGH (ref 0.70–1.28)
Glucose, Bld: 191 mg/dL — ABNORMAL HIGH (ref 65–99)
Potassium: 4.6 mmol/L (ref 3.5–5.3)
Sodium: 140 mmol/L (ref 135–146)

## 2023-02-09 ENCOUNTER — Telehealth: Payer: Self-pay | Admitting: *Deleted

## 2023-02-09 DIAGNOSIS — E1142 Type 2 diabetes mellitus with diabetic polyneuropathy: Secondary | ICD-10-CM | POA: Diagnosis not present

## 2023-02-09 DIAGNOSIS — L84 Corns and callosities: Secondary | ICD-10-CM | POA: Diagnosis not present

## 2023-02-09 DIAGNOSIS — L603 Nail dystrophy: Secondary | ICD-10-CM | POA: Diagnosis not present

## 2023-02-09 NOTE — Telephone Encounter (Signed)
[  9:22 AM] [name] [name] [id number]  [name] [name] scheduled for procedure 8/21 and is here wanting to reschedule it    Spoke with pt and informed him that provider is booking into September at this time and I don't have his schedule. Once we get his schedule will give him a call to get him rescheduled. Pt stated he doesn't have a ride and is currently taking care of his 70 year old MIL.

## 2023-02-22 ENCOUNTER — Ambulatory Visit: Payer: Medicare HMO | Admitting: Nurse Practitioner

## 2023-02-22 ENCOUNTER — Encounter: Payer: Self-pay | Admitting: Nurse Practitioner

## 2023-02-22 VITALS — BP 137/84 | HR 84 | Ht 74.0 in | Wt 215.8 lb

## 2023-02-22 DIAGNOSIS — E1122 Type 2 diabetes mellitus with diabetic chronic kidney disease: Secondary | ICD-10-CM | POA: Diagnosis not present

## 2023-02-22 DIAGNOSIS — Z7984 Long term (current) use of oral hypoglycemic drugs: Secondary | ICD-10-CM | POA: Diagnosis not present

## 2023-02-22 DIAGNOSIS — N1831 Chronic kidney disease, stage 3a: Secondary | ICD-10-CM

## 2023-02-22 DIAGNOSIS — E782 Mixed hyperlipidemia: Secondary | ICD-10-CM

## 2023-02-22 DIAGNOSIS — X32XXXA Exposure to sunlight, initial encounter: Secondary | ICD-10-CM | POA: Diagnosis not present

## 2023-02-22 DIAGNOSIS — I10 Essential (primary) hypertension: Secondary | ICD-10-CM

## 2023-02-22 DIAGNOSIS — L821 Other seborrheic keratosis: Secondary | ICD-10-CM | POA: Diagnosis not present

## 2023-02-22 DIAGNOSIS — L570 Actinic keratosis: Secondary | ICD-10-CM | POA: Diagnosis not present

## 2023-02-22 DIAGNOSIS — D225 Melanocytic nevi of trunk: Secondary | ICD-10-CM | POA: Diagnosis not present

## 2023-02-22 LAB — POCT GLYCOSYLATED HEMOGLOBIN (HGB A1C): Hemoglobin A1C: 8.1 % — AB (ref 4.0–5.6)

## 2023-02-22 NOTE — Patient Instructions (Signed)
 High-Fiber Eating Plan Fiber, also called dietary fiber, is found in foods such as fruits, vegetables, whole grains, and beans. A high-fiber diet can be good for your health. Your health care provider may recommend a high-fiber diet to help: Prevent trouble pooping (constipation). Lower your cholesterol. Treat the following conditions: Hemorrhoids. This is inflammation of veins in the anus. Inflammation of specific areas of the digestive tract. Irritable bowel syndrome (IBS). This is a problem of the large intestine, also called the colon, that sometimes causes belly pain and bloating. Prevent overeating as part of a weight-loss plan. Lower the risk of heart disease, type 2 diabetes, and certain cancers. What are tips for following this plan? Reading food labels  Check the nutrition facts label on foods for the amount of dietary fiber. Choose foods that have 4 grams of fiber or more per serving. The recommended goals for how much fiber you should eat each day include: Males 40 years old or younger: 30-34 g. Males over 37 years old: 28-34 g. Females 64 years old or younger: 25-28 g. Females over 69 years old: 22-25 g. Your daily fiber goal is _____________ g. Shopping Choose whole fruits and vegetables instead of processed. For example, choose apples instead of apple juice or applesauce. Choose a variety of high-fiber foods such as avocados, lentils, oats, and pinto beans. Read the nutrition facts label on foods. Check for foods with added fiber. These foods often have high sugar and salt (sodium) amounts per serving. Cooking Use whole-grain flour for baking and cooking. Cook with brown rice instead of white rice. Make meals that have a lot of beans and vegetables in them, such as chili or vegetable-based soups. Meal planning Start the day with a breakfast that is high in fiber, such as a cereal that has 5 g of fiber or more per serving. Eat breads and cereals that are made with  whole-grain flour instead of refined flour or white flour. Eat brown rice, bulgur wheat, or millet instead of white rice. Use beans in place of meat in soups, salads, and pasta dishes. Be sure that half of the grains you eat each day are whole grains. General information You can get the recommended amount of dietary fiber by: Eating a variety of fruits, vegetables, grains, nuts, and beans. Taking a fiber supplement if you aren't able to eat enough fiber. It's better to get fiber through food than from a supplement. Slowly increase how much fiber you eat. If you increase the amount of fiber you eat too quickly, you may have bloating, cramping, or gas. Drink plenty of water to help you digest fiber. Choose high-fiber snacks, such as berries, raw vegetables, nuts, and popcorn. What foods should I eat? Fruits Berries. Pears. Apples. Oranges. Avocado. Prunes and raisins. Dried figs. Vegetables Sweet potatoes. Spinach. Kale. Artichokes. Cabbage. Broccoli. Cauliflower. Green peas. Carrots. Squash. Grains Whole-grain breads. Multigrain cereal. Oats and oatmeal. Brown rice. Barley. Bulgur wheat. Millet. Quinoa. Bran muffins. Popcorn. Rye wafer crackers. Meats and other proteins Navy beans, kidney beans, and pinto beans. Soybeans. Split peas. Lentils. Nuts and seeds. Dairy Fiber-fortified yogurt. Fortified means that fiber has been added to the product. Beverages Fiber-fortified soy milk. Fiber-fortified orange juice. Other foods Fiber bars. The items listed above may not be all the foods and drinks you can have. Talk to a dietitian to learn more. What foods should I avoid? Fruits Fruit juice. Cooked, strained fruit. Vegetables Fried potatoes. Canned vegetables. Well-cooked vegetables. Grains White bread. Pasta made with refined  flour. White rice. Meats and other proteins Fatty meat. Fried chicken or fried fish. Dairy Milk. Cream cheese. Sour cream. Fats and  oils Butters. Beverages Soft drinks. Other foods Cakes and pastries. The items listed above may not be all the foods and drinks you should avoid. Talk to a dietitian to learn more. This information is not intended to replace advice given to you by your health care provider. Make sure you discuss any questions you have with your health care provider. Document Revised: 09/21/2022 Document Reviewed: 09/21/2022 Elsevier Patient Education  2024 [employer].

## 2023-02-22 NOTE — Progress Notes (Signed)
02/22/2023        Endocrinology follow-up note     Subjective:    Patient ID: [name] [name], male    DOB: 01/03/1951, PCP Burdine, Ananias Pilgrim, MD   Past Medical History:  Diagnosis Date  . Anxiety   . 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 Arthritis   . Coronary artery disease    NSTEMI 161096; multivessel CAD s/p post CABG (LIMA-LAD, SVG-D1, SVG-D2, SVG-OM)  . Essential hypertension   . GERD (gastroesophageal reflux disease)   . Gout   . History of kidney stones   . History of peptic ulcer disease   . Hyperlipidemia   . Left ureteral stone   . Nodule of right lung    CT 04-04-2017  right lower lobe multiple nodules  . OSA on CPAP   . Renal cyst, right    CT 04-04-2017  . Renal insufficiency   . Type 2 diabetes mellitus (HCC)   . Wears glasses    Past Surgical History:  Procedure Laterality Date  . CARDIOVASCULAR STRESS TEST  11-13-2016   dr Diona Browner   Intermediate risk nuclear study w/ medium defect of moderate severity in the basal inferoseptal, basal inferior, mid inferoseptal and mid inferior location (findings consistant w/ prior myocardial infarction)/  mild enlarged LV cavity size;  nuclear stress EF 45%  . COLONOSCOPY N/A 04/13/2017   Procedure: COLONOSCOPY;  Surgeon: Franky Macho, MD;  Location: [facility];  Service: Gastroenterology;  Laterality: N/A;  . CORONARY ARTERY BYPASS GRAFT N/A 04/25/2013   Procedure: CORONARY ARTERY BYPASS GRAFTING (CABG);  Surgeon: Alleen Borne, MD;  Location: [facility] OR;  Service: Open Heart Surgery;  Laterality: N/A;  Coronary artery bypass graft times four, on pump, using left internal mammary artery and right greater saphenous vein via endovein harvest.  . CYSTOSCOPY WITH RETROGRADE PYELOGRAM, URETEROSCOPY AND STENT PLACEMENT Left 04/08/2017   Procedure: CYSTOSCOPY WITH RETROGRADE PYELOGRAM, URETEROSCOPY,STENT PLACEMENT;  Surgeon: Crista Elliot, MD;  Location: [facility];  Service: Urology;  Laterality: Left;  . CYSTOSCOPY/URETEROSCOPY/HOLMIUM LASER/STENT PLACEMENT Left 05/17/2017   Procedure: CYSTOSCOPY / RETROGRADE PYELOGRAM / URETEROSCOPY / HOLMIUM LASER / STONE BASKETRY / STENT EXCHANGE;  Surgeon: Crista Elliot, MD;  Location: [facility];  Service: Urology;  Laterality: Left;  ONLY NEEDS 45 MIN FOR PROCEDURE  . ELBOW BURSA SURGERY Right 03-10-2017     at [facility]   and removal of spur  . INTRAOPERATIVE TRANSESOPHAGEAL ECHOCARDIOGRAM N/A 04/25/2013   Procedure: INTRAOPERATIVE TRANSESOPHAGEAL ECHOCARDIOGRAM;  Surgeon: Alleen Borne, MD;  Location: [facility] OR;  Service: Open Heart Surgery;  Laterality: N/A;  . KNEE ARTHROSCOPY Left 10/ 2017  approx.    dr Thomasena Edis  . LAPAROSCOPIC RIGHT HEMI COLECTOMY Right 04/19/2017   Procedure: OPEN RIGHT HEMI COLECTOMY;  Surgeon: Lucretia Roers, MD;  Location: [facility];  Service: General;  Laterality: Right;  . LAPAROSCOPY N/A 04/19/2017   Procedure: LAPAROSCOPY DIAGNOSTIC;  Surgeon: Lucretia Roers, MD;  Location: [facility];  Service: General;  Laterality: N/A;  patient knows to arrive at 6:15  . LEFT HEART CATHETERIZATION WITH CORONARY ANGIOGRAM N/A 04/20/2013   Procedure: LEFT HEART CATHETERIZATION WITH CORONARY ANGIOGRAM;  Surgeon: Micheline Chapman, MD;  Location: [facility] CATH LAB;  Service: Cardiovascular;  Laterality: N/A;  . NASAL SEPTUM SURGERY  1990s approx  . TONSILLECTOMY  42 (age 50)  . TRANSTHORACIC ECHOCARDIOGRAM  11-17-2016   dr Diona Browner  mild LVH,  ef 55-60%, LV wall motion & diastolic function indeterminant assessment,  images were inadequate/  mild LAE/  trivial Tr  . TYMPANOSTOMY TUBE PLACEMENT     Social History   Socioeconomic History  . Marital status: Married    Spouse name: Not on file  . Number of children: Not on file  . Years of education: Not on file  . Highest education level: Not on file  Occupational History  . Occupation: Drives Orthoptist: [employer]   Tobacco Use  . Smoking status: Never  . Smokeless tobacco: Never  Vaping Use  . Vaping status: Never Used  Substance and Sexual Activity  . Alcohol use: No    Alcohol/week: 0.0 standard drinks of alcohol  . Drug use: No  . Sexual activity: Yes    Birth control/protection: None  Other Topics Concern  . Not on file  Social History Narrative   Lives with wife, does not exercise but part of his job is strenuous. Works in a body-shop part-time, too.   2 of his mother's sisters and 1 brother died suddenly (family was told MI but no autopsy).    Social Determinants of Health   Financial Resource Strain: Low Risk  (05/11/2022)   Overall Financial Resource Strain (CARDIA)   . Difficulty of Paying Living Expenses: Not hard at all  Food Insecurity: No Food Insecurity (03/05/2022)   Hunger Vital Sign   . Worried About Programme researcher, broadcasting/film/video in the Last Year: Never true   . Ran Out of Food in the Last Year: Never true  Transportation Needs: No Transportation Needs (05/11/2022)   PRAPARE - Transportation   . Lack of Transportation (Medical): No   . Lack of Transportation (Non-Medical): No  Physical Activity: Not on file  Stress: Not on file  Social Connections: Not on file   Outpatient Encounter Medications as of 02/22/2023  Medication Sig  . allopurinol (ZYLOPRIM) 300 MG tablet Take 300 mg by mouth daily.  . Ascorbic Acid (VITAMIN C) 1000 MG tablet Take 1,000 mg by mouth daily.  Marland Kitchen aspirin EC 81 MG tablet Take 1 tablet (81 mg total) by mouth daily. Swallow whole.  . Blood Glucose Monitoring Suppl (ONE TOUCH ULTRA 2) w/Device KIT USE TO TEST BLOOD ONCE A DAY  . empagliflozin (JARDIANCE) 10 MG TABS tablet Take 1 tablet (10 mg total) by mouth daily before breakfast.  . escitalopram (LEXAPRO) 10 MG tablet Take 10 mg by mouth at bedtime.   Marland Kitchen ezetimibe (ZETIA) 10 MG tablet Take 1 tablet (10 mg total) by mouth daily.  Marland Kitchen glucose blood (ONETOUCH ULTRA) test strip Use as instructed to monitor glucose  once daily  . Lancets (ONETOUCH ULTRASOFT) lancets Use as instructed to monitor glucose once daily  . lisinopril (ZESTRIL) 2.5 MG tablet Take 1 tablet (2.5 mg total) by mouth daily.  Marland Kitchen lovastatin (MEVACOR) 20 MG tablet TAKE 1 TABLET AT BEDTIME  . metFORMIN (GLUCOPHAGE) 500 MG tablet TAKE 1 TABLET (500 MG TOTAL) BY MOUTH DAILY.  . metoprolol tartrate (LOPRESSOR) 25 MG tablet Take 0.5 tablets (12.5 mg total) by mouth 2 (two) times daily.  . Multiple Vitamin (MULTIVITAMIN WITH MINERALS) TABS tablet Take 1 tablet by mouth daily.  . Na Sulfate-K Sulfate-Mg Sulf 17.5-3.13-1.6 GM/177ML SOLN As directed  . Omega-3 Fatty Acids (FISH OIL) 1000 MG CPDR Take 1 capsule by mouth 2 (two) times a day.   Marland Kitchen omeprazole (PRILOSEC) 20 MG capsule Take 20 mg by mouth  every morning.   . pyridoxine (B-6) 200 MG tablet Take 200 mg by mouth daily.   No facility-administered encounter medications on file as of 02/22/2023.   ALLERGIES: No Known Allergies VACCINATION STATUS: Immunization History  Administered Date(s) Administered  . Influenza Split 02/10/2014  . Influenza,inj,Quad PF,6+ Mos 04/20/2017    Diabetes He presents for his follow-up diabetic visit. He has type 2 diabetes mellitus. His disease course has been worsening. There are no hypoglycemic associated symptoms. Pertinent negatives for hypoglycemia include no confusion, headaches, pallor or seizures. Pertinent negatives for diabetes include no chest pain, no fatigue, no foot ulcerations, no polydipsia, no polyphagia, no polyuria and no weakness. There are no hypoglycemic complications. Symptoms are resolved. Diabetic complications include heart disease and nephropathy. Risk factors for coronary artery disease include diabetes mellitus, dyslipidemia, hypertension, male sex and sedentary lifestyle. Current diabetic treatment includes oral agent (monotherapy). He is compliant with treatment all of the time. His weight is decreasing steadily. He is following a  generally healthy diet. When asked about meal planning, he reported none. He has not had a previous visit with a dietitian. He never participates in exercise. His home blood glucose trend is fluctuating dramatically. His breakfast blood glucose range is generally 180-200 mg/dl. (He presents today with no logs or meter to review.  Her reports higher readings lately in 180-200 range.  His POCT A1c today is 8.1%, increasing from last visit of 7.1%.  He notes he has been more stressed lately and his dietary pattern has been different as well.  ) An ACE inhibitor/angiotensin II receptor blocker is being taken. He does not see a podiatrist.Eye exam is current.  Hyperlipidemia This is a chronic problem. The current episode started more than 1 year ago. The problem is uncontrolled. Recent lipid tests were reviewed and are variable. Exacerbating diseases include chronic renal disease and diabetes. Factors aggravating his hyperlipidemia include beta blockers. Pertinent negatives include no chest pain, myalgias or shortness of breath. Current antihyperlipidemic treatment includes statins. The current treatment provides mild improvement of lipids. Compliance problems include adherence to diet and adherence to exercise.  Risk factors for coronary artery disease include diabetes mellitus, dyslipidemia, hypertension, male sex and a sedentary lifestyle.  Hypertension This is a chronic problem. The current episode started more than 1 year ago. The problem has been resolved since onset. The problem is controlled. Pertinent negatives include no chest pain, headaches, neck pain, palpitations or shortness of breath. There are no associated agents to hypertension. Risk factors for coronary artery disease include dyslipidemia, diabetes mellitus, male gender and sedentary lifestyle. Past treatments include beta blockers and ACE inhibitors. The current treatment provides mild improvement. There are no compliance problems.  Hypertensive  end-organ damage includes kidney disease and CAD/MI. Identifiable causes of hypertension include chronic renal disease.   Review of systems  Constitutional: + steadily decreasing body weight (intentional),  current Body mass index is 27.71 kg/m. , no fatigue, no subjective hyperthermia, no subjective hypothermia Eyes: no blurry vision, no xerophthalmia ENT: no sore throat, no nodules palpated in throat, no dysphagia/odynophagia, no hoarseness Cardiovascular: no chest pain, no shortness of breath, no palpitations, no leg swelling Respiratory: no cough, no shortness of breath Gastrointestinal: no nausea/vomiting/diarrhea Musculoskeletal: no muscle/joint aches Skin: no rashes, no hyperemia Neurological: no tremors, no numbness, no tingling, no dizziness Psychiatric: no depression, no anxiety    Objective:    BP 137/84 (BP Location: Left Arm, Patient Position: Sitting, Cuff Size: Large)   Pulse 84   Ht  6\' 2"  (1.88 m)   Wt 215 lb 12.8 oz (97.9 kg)   BMI 27.71 kg/m   Wt Readings from Last 3 Encounters:  02/22/23 215 lb 12.8 oz (97.9 kg)  01/21/23 216 lb 6.4 oz (98.2 kg)  01/07/23 217 lb 12.8 oz (98.8 kg)    BP Readings from Last 3 Encounters:  02/22/23 137/84  01/21/23 102/70  01/07/23 127/66     Physical Exam- Limited  Constitutional:  Body mass index is 27.71 kg/m. , not in acute distress, normal state of mind Eyes:  EOMI, no exophthalmos Musculoskeletal: no gross deformities, strength intact in all four extremities, no gross restriction of joint movements Skin:  no rashes, no hyperemia Neurological: no tremor with outstretched hands   Diabetic Foot Exam - Simple   No data filed     CMP     Component Value Date/Time   NA 140 02/01/2023 1028   NA 143 10/13/2021 0935   K 4.6 02/01/2023 1028   CL 103 02/01/2023 1028   CO2 25 02/01/2023 1028   GLUCOSE 191 (H) 02/01/2023 1028   BUN 23 02/01/2023 1028   BUN 16 10/13/2021 0935   CREATININE 1.29 (H) 02/01/2023 1028    CALCIUM 9.7 02/01/2023 1028   PROT 6.5 08/10/2022 1004   PROT 6.1 10/13/2021 0935   ALBUMIN 4.0 10/13/2021 0935   AST 20 08/10/2022 1004   ALT 18 08/10/2022 1004   ALKPHOS 99 10/13/2021 0935   BILITOT 0.9 08/10/2022 1004   BILITOT 0.8 10/13/2021 0935   GFRNONAA 40 (L) 07/24/2020 0932   GFRNONAA 52 (L) 01/23/2020 1002   GFRAA 47 (L) 07/24/2020 0932   GFRAA 60 01/23/2020 1002     Diabetic Labs (most recent): Lab Results  Component Value Date   HGBA1C 8.1 (A) 02/22/2023   HGBA1C 7.1 10/21/2022   HGBA1C 7.3 (A) 06/18/2022   MICROALBUR 80 02/16/2022   MICROALBUR 80 12/16/2020   MICROALBUR 7.3 09/09/2017     Lipid Panel ( most recent) Lipid Panel     Component Value Date/Time   CHOL 163 08/10/2022 1004   CHOL 161 11/20/2020 1017   TRIG 227 (H) 08/10/2022 1004   HDL 47 08/10/2022 1004   HDL 43 11/20/2020 1017   CHOLHDL 3.5 08/10/2022 1004   VLDL 21 07/02/2016 0853   LDLCALC 84 08/10/2022 1004     Assessment & Plan:   1) Type 2 diabetes mellitus with other circulatory complications , and acute on chronic renal insufficiency   His diabetes is complicated by coronary artery disease status post coronary artery bypass graft, acute on chronic renal insufficiency and patient remains at a high risk for more acute and chronic complications of diabetes which include CAD, CVA, CKD, retinopathy, and neuropathy. These are all discussed in detail with the patient.  He presents today with no logs or meter to review.  Her reports higher readings lately in 180-200 range.  His POCT A1c today is 8.1%, increasing from last visit of 7.1%.  He notes he has been more stressed lately and his dietary pattern has been different as well.    - Recent labs reviewed.   - Nutritional counseling repeated at each appointment due to patients tendency to fall back in to old habits.  - The patient admits there is a room for improvement in their diet and drink choices. -  Suggestion is made for the  patient to avoid simple carbohydrates from their diet including Cakes, Sweet Desserts / Pastries, Ice Cream, Soda (diet  and regular), Sweet Tea, Candies, Chips, Cookies, Sweet Pastries, Store Bought Juices, Alcohol in Excess of 1-2 drinks a day, Artificial Sweeteners, Coffee Creamer, and "Sugar-free" Products. This will help patient to have stable blood glucose profile and potentially avoid unintended weight gain.   - I encouraged the patient to switch to unprocessed or minimally processed complex starch and increased protein intake (animal or plant source), fruits, and vegetables.   - Patient is advised to stick to a routine mealtimes to eat 3 meals a day and avoid unnecessary snacks (to snack only to correct hypoglycemia).  - I have approached patient with the following individualized plan to manage diabetes and patient agrees.  -He is advised to continue his Metformin 500 mg po daily with breakfast and Jardiance 10 mg po daily (he can take 0.5 tab extra to help bring glucose down back to goal).    -I did encourage him to continue monitoring glucose once daily to help keep him on track.   2) BP/HTN:  His blood pressure is controlled to target.  He is advised to continue Lisinopril 5 mg po daily and Metoprolol 12.5 mg po BID.  3) Lipids/HPL:  His most recent lipid panel from 08/10/22 shows controlled LDL of 84 and elevated triglycerides of 227.  He is advised to continue Lovastatin 20 mg po daily at bedtime and fish oil supplement twice daily.  Encouraged him to avoid fried foods and butter.   4) Weight management:  His Body mass index is 27.71 kg/m.-a candidate for modest weight loss.  Detailed carbohydrates and exercise regimen was discussed with him.  5) Chronic Care/Health Maintenance: -Patient is on ACE and Statin medications and encouraged to continue to follow up with Ophthalmology, Podiatrist at least yearly or according to recommendations, and advised to  stay away from smoking. I  have recommended yearly flu vaccine and pneumonia vaccination at least every 5 years; moderate intensity exercise for up to 150 minutes weekly; and  sleep for at least 7 hours a day.   - I advised patient to maintain close follow up with Burdine, Ananias Pilgrim, MD for primary care needs.     I spent  40  minutes in the care of the patient today including review of labs from CMP, Lipids, Thyroid Function, Hematology (current and previous including abstractions from other facilities); face-to-face time discussing  his blood glucose readings/logs, discussing hypoglycemia and hyperglycemia episodes and symptoms, medications doses, his options of short and long term treatment based on the latest standards of care / guidelines;  discussion about incorporating lifestyle medicine;  and documenting the encounter. Risk reduction counseling performed per USPSTF guidelines to reduce obesity and cardiovascular risk factors.     Please refer to Patient Instructions for Blood Glucose Monitoring and Insulin/Medications Dosing Guide"  in media tab for additional information. Please  also refer to " Patient Self Inventory" in the Media  tab for reviewed elements of pertinent patient history.  [name] [name] participated in the discussions, expressed understanding, and voiced agreement with the above plans.  All questions were answered to his satisfaction. he is encouraged to contact clinic should he have any questions or concerns prior to his return visit.   Follow up plan: -Return in about 4 months (around 06/24/2023) for Diabetes F/U with A1c in office, No previsit labs, Bring meter and logs.    Ronny Bacon, [facility] [address] [place], [place] [address] Phone: [phone] Fax: [phone]  02/22/2023, 8:29 AM

## 2023-03-01 ENCOUNTER — Inpatient Hospital Stay (HOSPITAL_COMMUNITY): Admission: RE | Admit: 2023-03-01 | Payer: Medicare HMO | Source: Ambulatory Visit

## 2023-03-03 ENCOUNTER — Encounter (HOSPITAL_COMMUNITY): Payer: Self-pay

## 2023-03-03 ENCOUNTER — Ambulatory Visit (HOSPITAL_COMMUNITY): Admit: 2023-03-03 | Payer: Medicare HMO | Admitting: Internal Medicine

## 2023-03-03 SURGERY — COLONOSCOPY WITH PROPOFOL
Anesthesia: Monitor Anesthesia Care

## 2023-03-11 DIAGNOSIS — K219 Gastro-esophageal reflux disease without esophagitis: Secondary | ICD-10-CM | POA: Diagnosis not present

## 2023-03-11 DIAGNOSIS — E785 Hyperlipidemia, unspecified: Secondary | ICD-10-CM | POA: Diagnosis not present

## 2023-03-11 DIAGNOSIS — E1142 Type 2 diabetes mellitus with diabetic polyneuropathy: Secondary | ICD-10-CM | POA: Diagnosis not present

## 2023-03-11 DIAGNOSIS — N529 Male erectile dysfunction, unspecified: Secondary | ICD-10-CM | POA: Diagnosis not present

## 2023-03-11 DIAGNOSIS — F325 Major depressive disorder, single episode, in full remission: Secondary | ICD-10-CM | POA: Diagnosis not present

## 2023-03-11 DIAGNOSIS — G4733 Obstructive sleep apnea (adult) (pediatric): Secondary | ICD-10-CM | POA: Diagnosis not present

## 2023-03-11 DIAGNOSIS — E1122 Type 2 diabetes mellitus with diabetic chronic kidney disease: Secondary | ICD-10-CM | POA: Diagnosis not present

## 2023-03-11 DIAGNOSIS — I129 Hypertensive chronic kidney disease with stage 1 through stage 4 chronic kidney disease, or unspecified chronic kidney disease: Secondary | ICD-10-CM | POA: Diagnosis not present

## 2023-03-11 DIAGNOSIS — Z008 Encounter for other general examination: Secondary | ICD-10-CM | POA: Diagnosis not present

## 2023-03-11 DIAGNOSIS — I2510 Atherosclerotic heart disease of native coronary artery without angina pectoris: Secondary | ICD-10-CM | POA: Diagnosis not present

## 2023-03-11 DIAGNOSIS — N189 Chronic kidney disease, unspecified: Secondary | ICD-10-CM | POA: Diagnosis not present

## 2023-03-11 DIAGNOSIS — I252 Old myocardial infarction: Secondary | ICD-10-CM | POA: Diagnosis not present

## 2023-03-11 DIAGNOSIS — M1990 Unspecified osteoarthritis, unspecified site: Secondary | ICD-10-CM | POA: Diagnosis not present

## 2023-03-17 ENCOUNTER — Encounter: Payer: Self-pay | Admitting: Nurse Practitioner

## 2023-03-17 ENCOUNTER — Ambulatory Visit: Payer: Medicare HMO | Attending: Nurse Practitioner | Admitting: Nurse Practitioner

## 2023-03-17 ENCOUNTER — Other Ambulatory Visit: Payer: Self-pay | Admitting: Nurse Practitioner

## 2023-03-17 VITALS — BP 118/64 | HR 70 | Ht 74.0 in | Wt 216.0 lb

## 2023-03-17 DIAGNOSIS — I2510 Atherosclerotic heart disease of native coronary artery without angina pectoris: Secondary | ICD-10-CM

## 2023-03-17 DIAGNOSIS — E782 Mixed hyperlipidemia: Secondary | ICD-10-CM

## 2023-03-17 DIAGNOSIS — G4733 Obstructive sleep apnea (adult) (pediatric): Secondary | ICD-10-CM | POA: Diagnosis not present

## 2023-03-17 DIAGNOSIS — I5022 Chronic systolic (congestive) heart failure: Secondary | ICD-10-CM | POA: Diagnosis not present

## 2023-03-17 DIAGNOSIS — I10 Essential (primary) hypertension: Secondary | ICD-10-CM

## 2023-03-17 MED ORDER — LOVASTATIN ER 40 MG PO TB24: 40.0000 mg | ORAL_TABLET | Freq: Every day | ORAL | 1 refills | Status: DC

## 2023-03-17 NOTE — Patient Instructions (Addendum)
Medication Instructions:  Your physician has recommended you make the following change in your medication:  Increase Lovastatin to 40 Mg Daily at bedtime  Continue all other medication as prescribed   Labwork: [facility] at [facility] LFT. FLP  Testing/Procedures: None  Follow-Up: Your physician recommends that you schedule a follow-up appointment in: 6 Months with Philis Nettle   Any Other Special Instructions Will Be Listed Below (If Applicable).  If you need a refill on your cardiac medications before your next appointment, please call your pharmacy.

## 2023-03-17 NOTE — Progress Notes (Addendum)
Cardiology Office Note:  .   Date:  03/17/2023 ID:  FREAD ACKROYD, DOB Jun 05, 1951, MRN [id number] PCP: Juliette Alcide, MD  [facility] Providers Cardiologist:  Nona Dell, MD    History of Present Illness: .   [name] [name] is a 72 y.o. male with a PMH of CAD, s/p CABG x4, HFmrEF, mixed HLD, HTN, T2DM, OSA on CPAP, who presents today for follow-up.   Last saw patient on January 21, 2023.  Was doing well at the time.  Did admit to occasional dizziness when standing up. Lisinopril decreased to 2.5 mg daily and Lopressor held x 1 week to improve symptoms of dizziness.  Today he presents for follow-up.  He is overall doing well.  Denies any chest pain, shortness of breath, palpitations, syncope, presyncope, orthopnea, PND, denies any recent dizziness, swelling or significant weight changes, acute bleeding, or claudication. Weight is stable per our records.   SH: Works at [employer]. Enjoys riding his motorcycle in his free time.   Studies Reviewed: .    Echocardiogram on August 25, 2022:  1. Left ventricular ejection fraction, by estimation, is 45 to 50%. The  left ventricle has mildly decreased function. The left ventricle has no  regional wall motion abnormalities. Left ventricular diastolic parameters  are indeterminate.   2. Right ventricular systolic function is mildly reduced. The right  ventricular size is normal.   3. Left atrial size was mildly dilated.   4. The mitral valve is normal in structure. Trivial mitral valve  regurgitation. No evidence of mitral stenosis.   5. The aortic valve is tricuspid. There is mild calcification of the  aortic valve. Aortic valve regurgitation is not visualized. No aortic  stenosis is present.   Comparison(s): No prior Echocardiogram.      Lexiscan on August 12, 2022:   Pharmacological stress ECG is negative for ischemia. Rest and stress ECG positive for diffuse T wave inversions, 1 mm. No chest pain during the  test.   LV perfusion is abnormal. There is a small reversible perfusion defect present in the mid inferolateral location consistent with ischemia. There is a medium fixed perfusion defect present in the mid to basal inferior location consistent with infarction.   Nuclear stress EF: 36 %. The left ventricular ejection fraction is moderately decreased (30-44%). End diastolic cavity size is moderately enlarged. End systolic cavity size is moderately enlarged.   Findings are consistent with ischemia and infarction. The study is intermediate risk.     Physical Exam:   VS:  BP 118/64   Pulse 70   Ht 6\' 2"  (1.88 m)   Wt 216 lb (98 kg)   SpO2 95%   BMI 27.73 kg/m    Wt Readings from Last 3 Encounters:  03/17/23 216 lb (98 kg)  02/22/23 215 lb 12.8 oz (97.9 kg)  01/21/23 216 lb 6.4 oz (98.2 kg)    GEN: Well nourished, well developed in no acute distress NECK: No JVD; No carotid bruits CARDIAC: S1/S2, RRR, no murmurs, rubs, gallops RESPIRATORY:  Clear to auscultation without rales, wheezing or rhonchi  ABDOMEN: Soft, non-tender, non-distended EXTREMITIES:  No edema; No deformity   ASSESSMENT AND PLAN: .   1. HFmrEF Stage C, NYHA class I symptoms. TTE 08/2022 revealed EF 45 to 50%. Euvolemic and well compensated on exam.  Continue current GDMT. GDMT limited d/t current BP.  Low sodium diet, fluid restriction <2L, and daily weights encouraged. Educated to contact our office for weight  gain of 2 lbs overnight or 5 lbs in one week.    2. CAD, s/p CABG x 4 No chest pain.  NST 07/2022 was deemed intermediate risk, findings consistent with ischemia and infarction.  Hx of microvascular CAD seen on cath, was not able to do intervention.  TTE 08/2022 revealed mildly reduced EF.  Continue aspirin, Zetia, Lopressor, and Lisinopril.  Increasing lovastatin as mentioned below.  Heart healthy diet and regular cardiovascular exercise encouraged. ED precautions discussed.     3. Mixed hyperlipidemia Previous LDL  was 84.  LDL goal < 55.  Will increase lovastatin to 40 mg daily and continue Zetia.  Heart healthy diet and regular cardiovascular exercise encouraged.  Will obtain FLP and LFT in 6 to 8 weeks per protocol.  4. Hypertension Blood pressure stable. Discussed to monitor BP at home at least 2 hours after medications and sitting for 5-10 minutes.  Continue current medication regimen.  Heart healthy diet and regular cardiovascular exercise encouraged.     5. OSA Previously referred to pulmonology.  Sees Dr. Vassie Loll.  Continue follow-up with pulmonology.  Heart healthy diet and regular cardiovascular exercise encouraged.   Dispo: Follow-up with Dr. Diona Browner or APP in 6 months or sooner if anything changes.   Signed, Sharlene Dory, NP

## 2023-03-25 NOTE — Telephone Encounter (Signed)
Spoke with pt. He has been scheduled for 10/2. He already has prep. New instructions with prep to be sent.

## 2023-03-26 ENCOUNTER — Encounter: Payer: Self-pay | Admitting: *Deleted

## 2023-04-08 NOTE — Patient Instructions (Signed)
[name] [name]  04/08/2023     @PREFPERIOPPHARMACY @   Your procedure is scheduled on 04/14/23.  Report to [facility] at 0630 A.M.  Call this number if you have problems the morning of surgery:  [phone]  If you experience any cold or flu symptoms such as cough, fever, chills, shortness of breath, etc. between now and your scheduled surgery, please notify us at the above number.   Remember:  Do not eat or drink after midnight.      Take these medicines the morning of surgery with A SIP OF WATER allopurinol, lexapro, metoprolol & prilosec.   LAST DOSE OF JARDIANCE TO BE 04/10/23. DO NOT TAKE ANY DIABETIC MEDICATIONS THE MORNING OF PROCEDURE.    Do not wear jewelry, make-up or nail polish, including gel polish,  artificial nails, or any other type of covering on natural nails (fingers and  toes).  Do not wear lotions, powders, or perfumes, or deodorant.  Do not shave 48 hours prior to surgery.  Men may shave face and neck.  Do not bring valuables to the hospital.  [facility] is not responsible for any belongings or valuables.  Contacts, dentures or bridgework may not be worn into surgery.  Leave your suitcase in the car.  After surgery it may be brought to your room.  For patients admitted to the hospital, discharge time will be determined by your treatment team.  Patients discharged the day of surgery will not be allowed to drive home.   Name and phone number of your driver:   FAMILY  Special instructions:  FOLLOW PREP & DIET INSTRUCTIONS PROVIDED TO YOU FROM DOCTOR'S OFFICE.  Please read over the following fact sheets that you were given. Anesthesia Post-op Instructions and Care and Recovery After Surgery      PATIENT INSTRUCTIONS POST-ANESTHESIA  IMMEDIATELY FOLLOWING SURGERY:  Do not drive or operate machinery for the first twenty four hours after surgery.  Do not make any important decisions for twenty four hours after surgery or while taking narcotic pain  medications or sedatives.  If you develop intractable nausea and vomiting or a severe headache please notify your doctor immediately.  FOLLOW-UP:  Please make an appointment with your surgeon as instructed. You do not need to follow up with anesthesia unless specifically instructed to do so.  WOUND CARE INSTRUCTIONS (if applicable):  Keep a dry clean dressing on the anesthesia/puncture wound site if there is drainage.  Once the wound has quit draining you may leave it open to air.  Generally you should leave the bandage intact for twenty four hours unless there is drainage.  If the epidural site drains for more than 36-48 hours please call the anesthesia department.  QUESTIONS?:  Please feel free to call your physician or the hospital operator if you have any questions, and they will be happy to assist you.      Colonoscopy, Adult A colonoscopy is a procedure to look at the entire large intestine. This procedure is done using a long, thin, flexible tube that has a camera on the end. You may have a colonoscopy: As a part of normal colorectal screening. If you have certain symptoms, such as: A low number of red blood cells in your blood (anemia). Diarrhea that does not go away. Pain in your abdomen. Blood in your stool. A colonoscopy can help screen for and diagnose medical problems, including: An abnormal growth of cells or tissue (tumor). Abnormal growths within the lining of your intestine (  polyps). Inflammation. Areas of bleeding. Tell your health care provider about: Any allergies you have. All medicines you are taking, including vitamins, herbs, eye drops, creams, and over-the-counter medicines. Any problems you or family members have had with anesthetic medicines. Any bleeding problems you have. Any surgeries you have had. Any medical conditions you have. Any problems you have had with having bowel movements. Whether you are pregnant or may be pregnant. What are the  risks? Generally, this is a safe procedure. However, problems may occur, including: Bleeding. Damage to your intestine. Allergic reactions to medicines given during the procedure. Infection. This is rare. What happens before the procedure? Eating and drinking restrictions Follow instructions from your health care provider about eating or drinking restrictions, which may include: A few days before the procedure: Follow a low-fiber diet. Avoid nuts, seeds, dried fruit, raw fruits, and vegetables. 1-3 days before the procedure: Eat only gelatin dessert or ice pops. Drink only clear liquids, such as water, clear juice, clear broth or bouillon, black coffee or tea, or clear soft drinks or sports drinks. Avoid liquids that contain red or purple dye. The day of the procedure: Do not eat solid foods. You may continue to drink clear liquids until up to 2 hours before the procedure. Do not eat or drink anything starting 2 hours before the procedure, or within the time period that your health care provider recommends. Bowel prep If you were prescribed a bowel prep to take by mouth (orally) to clean out your colon: Take it as told by your health care provider. Starting the day before your procedure, you will need to drink a large amount of liquid medicine. The liquid will cause you to have many bowel movements of loose stool until your stool becomes almost clear or light green. If your skin or the opening between the buttocks (anus) gets irritated from diarrhea, you may relieve the irritation using: Wipes with medicine in them, such as adult wet wipes with aloe and vitamin E. A product to soothe skin, such as petroleum jelly. If you vomit while drinking the bowel prep: Take a break for up to 60 minutes. Begin the bowel prep again. Call your health care provider if you keep vomiting or you cannot take the bowel prep without vomiting. To clean out your colon, you may also be given: Laxative  medicines. These help you have a bowel movement. Instructions for enema use. An enema is liquid medicine injected into your rectum. Medicines Ask your health care provider about: Changing or stopping your regular medicines or supplements. This is especially important if you are taking iron supplements, diabetes medicines, or blood thinners. Taking medicines such as aspirin and ibuprofen. These medicines can thin your blood. Do not take these medicines unless your health care provider tells you to take them. Taking over-the-counter medicines, vitamins, herbs, and supplements. General instructions Ask your health care provider what steps will be taken to help prevent infection. These may include washing skin with a germ-killing soap. If you will be going home right after the procedure, plan to have a responsible adult: Take you home from the hospital or clinic. You will not be allowed to drive. Care for you for the time you are told. What happens during the procedure?  An IV will be inserted into one of your veins. You will be given a medicine to make you fall asleep (general anesthetic). You will lie on your side with your knees bent. A lubricant will be put on the tube.  Then the tube will be: Inserted into your anus. Gently eased through all parts of your large intestine. Air will be sent into your colon to keep it open. This may cause some pressure or cramping. Images will be taken with the camera and will appear on a screen. A small tissue sample may be removed to be looked at under a microscope (biopsy). The tissue may be sent to a lab for testing if any signs of problems are found. If small polyps are found, they may be removed and checked for cancer cells. When the procedure is finished, the tube will be removed. The procedure may vary among health care providers and hospitals. What happens after the procedure? Your blood pressure, heart rate, breathing rate, and blood oxygen level  will be monitored until you leave the hospital or clinic. You may have a small amount of blood in your stool. You may pass gas and have mild cramping or bloating in your abdomen. This is caused by the air that was used to open your colon during the exam. If you were given a sedative during the procedure, it can affect you for several hours. Do not drive or operate machinery until your health care provider says that it is safe. It is up to you to get the results of your procedure. Ask your health care provider, or the department that is doing the procedure, when your results will be ready. Summary A colonoscopy is a procedure to look at the entire large intestine. Follow instructions from your health care provider about eating and drinking before the procedure. If you were prescribed an oral bowel prep to clean out your colon, take it as told by your health care provider. During the colonoscopy, a flexible tube with a camera on its end is inserted into the anus and then passed into all parts of the large intestine. This information is not intended to replace advice given to you by your health care provider. Make sure you discuss any questions you have with your health care provider. Document Revised: 08/11/2022 Document Reviewed: 02/19/2021 Elsevier Patient Education  2024 [employer] Colonoscopy, Adult, Care After The following information offers guidance on how to care for yourself after your procedure. Your health care provider may also give you more specific instructions. If you have problems or questions, contact your health care provider. What can I expect after the procedure? After the procedure, it is common to have: A small amount of blood in your stool for 24 hours after the procedure. Some gas. Mild cramping or bloating of your abdomen. Follow these instructions at home: Eating and drinking  Drink enough fluid to keep your urine pale yellow. Follow instructions from your health  care provider about eating or drinking restrictions. Resume your normal diet as told by your health care provider. Avoid heavy or fried foods that are hard to digest. Activity Rest as told by your health care provider. Avoid sitting for a long time without moving. Get up to take short walks every 1-2 hours. This is important to improve blood flow and breathing. Ask for help if you feel weak or unsteady. Return to your normal activities as told by your health care provider. Ask your health care provider what activities are safe for you. Managing cramping and bloating  Try walking around when you have cramps or feel bloated. If directed, apply heat to your abdomen as told by your health care provider. Use the heat source that your health care provider recommends, such  as a moist heat pack or a heating pad. Place a towel between your skin and the heat source. Leave the heat on for 20-30 minutes. Remove the heat if your skin turns bright red. This is especially important if you are unable to feel pain, heat, or cold. You have a greater risk of getting burned. General instructions If you were given a sedative during the procedure, it can affect you for several hours. Do not drive or operate machinery until your health care provider says that it is safe. For the first 24 hours after the procedure: Do not sign important documents. Do not drink alcohol. Do your regular daily activities at a slower pace than normal. Eat soft foods that are easy to digest. Take over-the-counter and prescription medicines only as told by your health care provider. Keep all follow-up visits. This is important. Contact a health care provider if: You have blood in your stool 2-3 days after the procedure. Get help right away if: You have more than a small spotting of blood in your stool. You have large blood clots in your stool. You have swelling of your abdomen. You have nausea or vomiting. You have a fever. You have  increasing pain in your abdomen that is not relieved with medicine. These symptoms may be an emergency. Get help right away. Call 911. Do not wait to see if the symptoms will go away. Do not drive yourself to the hospital. Summary After the procedure, it is common to have a small amount of blood in your stool. You may also have mild cramping and bloating of your abdomen. If you were given a sedative during the procedure, it can affect you for several hours. Do not drive or operate machinery until your health care provider says that it is safe. Get help right away if you have a lot of blood in your stool, nausea or vomiting, a fever, or increased pain in your abdomen. This information is not intended to replace advice given to you by your health care provider. Make sure you discuss any questions you have with your health care provider. Document Revised: 08/11/2022 Document Reviewed: 02/19/2021 Elsevier Patient Education  2024 [employer].

## 2023-04-12 ENCOUNTER — Encounter (HOSPITAL_COMMUNITY): Payer: Self-pay

## 2023-04-12 ENCOUNTER — Encounter (HOSPITAL_COMMUNITY)
Admission: RE | Admit: 2023-04-12 | Discharge: 2023-04-12 | Disposition: A | Discharge status: Home / Self Care | Payer: Medicare HMO | Source: Ambulatory Visit | Attending: Internal Medicine | Admitting: Internal Medicine

## 2023-04-12 VITALS — BP 131/73 | HR 67 | Temp 98.4°F | Resp 18 | Ht 74.0 in | Wt 216.1 lb

## 2023-04-12 DIAGNOSIS — Z01818 Encounter for other preprocedural examination: Secondary | ICD-10-CM

## 2023-04-12 DIAGNOSIS — Z01812 Encounter for preprocedural laboratory examination: Secondary | ICD-10-CM | POA: Insufficient documentation

## 2023-04-12 HISTORY — DX: Acute myocardial infarction, unspecified: I21.9

## 2023-04-12 LAB — BASIC METABOLIC PANEL
Anion gap: 11 (ref 5–15)
BUN: 16 mg/dL (ref 8–23)
CO2: 23 mmol/L (ref 22–32)
Calcium: 8.9 mg/dL (ref 8.9–10.3)
Chloride: 104 mmol/L (ref 98–111)
Creatinine, Ser: 1.29 mg/dL — ABNORMAL HIGH (ref 0.61–1.24)
GFR, Estimated: 60 mL/min — ABNORMAL LOW (ref >60)
Glucose, Bld: 236 mg/dL — ABNORMAL HIGH (ref 70–99)
Potassium: 4 mmol/L (ref 3.5–5.1)
Sodium: 138 mmol/L (ref 135–145)

## 2023-04-12 MED ORDER — SIMVASTATIN 10 MG PO TABS: 10.0000 mg | ORAL_TABLET | Freq: Every day | ORAL | 3 refills | Status: DC

## 2023-04-12 NOTE — Progress Notes (Signed)
  Mr. Full perioperative risk of a major cardiac event is 6.6% according to the Revised Cardiac Risk Index (RCRI).  Therefore, he is at high risk for perioperative complications.   His functional capacity is good at > 4 METs according to the Duke Activity Status Index (DASI). Recommendations: According to ACC/AHA guidelines, no further cardiovascular testing needed.  The patient may proceed to surgery at acceptable risk.   Antiplatelet and/or Anticoagulation Recommendations: The patient should remain on Aspirin without interruption.  Will route this note to the requesting party.

## 2023-04-13 NOTE — Anesthesia Preprocedure Evaluation (Signed)
Anesthesia Evaluation  Patient identified by MRN, date of birth, ID band Patient awake    Reviewed: Allergy & Precautions, NPO status , Patient's Chart, lab work & pertinent test results  History of Anesthesia Complications Negative for: history of anesthetic complications  Airway Mallampati: II  TM Distance: >3 FB Neck ROM: Full    Dental  (+) Teeth Intact, Implants, Dental Advisory Given All teeth non removable implants, advised about chipping and damage possibility, and he accepts.:   Pulmonary sleep apnea and Continuous Positive Airway Pressure Ventilation    breath sounds clear to auscultation       Cardiovascular hypertension, Pt. on medications + CAD, + Past MI and + CABG   Rhythm:Regular Rate:Normal  LV EF: 55% -   60%   Neuro/Psych  PSYCHIATRIC DISORDERS Anxiety     negative neurological ROS     GI/Hepatic Neg liver ROS,GERD  ,,  Endo/Other  diabetes, Type 2, Oral Hypoglycemic Agents    Renal/GU Renal InsufficiencyRenal disease     Musculoskeletal   Abdominal   Peds  Hematology negative hematology ROS (+)   Anesthesia Other Findings   Reproductive/Obstetrics                             Anesthesia Physical Anesthesia Plan  ASA: 3  Anesthesia Plan: General   Post-op Pain Management: Minimal or no pain anticipated   Induction: Intravenous  PONV Risk Score and Plan: Propofol infusion  Airway Management Planned: Nasal Cannula and Natural Airway  Additional Equipment:   Intra-op Plan:   Post-operative Plan: Extubation in OR  Informed Consent: I have reviewed the patients History and Physical, chart, labs and discussed the procedure including the risks, benefits and alternatives for the proposed anesthesia with the patient or authorized representative who has indicated his/her understanding and acceptance.     Dental advisory given  Plan Discussed with: CRNA and  Anesthesiologist  Anesthesia Plan Comments:         Anesthesia Quick Evaluation

## 2023-04-14 ENCOUNTER — Ambulatory Visit (HOSPITAL_COMMUNITY): Payer: Self-pay | Admitting: Anesthesiology

## 2023-04-14 ENCOUNTER — Encounter (HOSPITAL_COMMUNITY)
Admission: RE | Disposition: A | Discharge status: Home / Self Care | Payer: Self-pay | Source: Home / Self Care | Attending: Internal Medicine

## 2023-04-14 ENCOUNTER — Ambulatory Visit (HOSPITAL_COMMUNITY)
Admission: RE | Admit: 2023-04-14 | Discharge: 2023-04-14 | Disposition: A | Discharge status: Home / Self Care | Payer: Medicare HMO | Attending: Internal Medicine | Admitting: Internal Medicine

## 2023-04-14 ENCOUNTER — Other Ambulatory Visit: Payer: Self-pay

## 2023-04-14 DIAGNOSIS — Z8719 Personal history of other diseases of the digestive system: Secondary | ICD-10-CM

## 2023-04-14 DIAGNOSIS — Z980 Intestinal bypass and anastomosis status: Secondary | ICD-10-CM | POA: Diagnosis not present

## 2023-04-14 DIAGNOSIS — Z800 Family history of malignant neoplasm of digestive organs: Secondary | ICD-10-CM | POA: Diagnosis not present

## 2023-04-14 DIAGNOSIS — I252 Old myocardial infarction: Secondary | ICD-10-CM | POA: Insufficient documentation

## 2023-04-14 DIAGNOSIS — Z951 Presence of aortocoronary bypass graft: Secondary | ICD-10-CM | POA: Insufficient documentation

## 2023-04-14 DIAGNOSIS — E1159 Type 2 diabetes mellitus with other circulatory complications: Secondary | ICD-10-CM

## 2023-04-14 DIAGNOSIS — N289 Disorder of kidney and ureter, unspecified: Secondary | ICD-10-CM | POA: Insufficient documentation

## 2023-04-14 DIAGNOSIS — F419 Anxiety disorder, unspecified: Secondary | ICD-10-CM | POA: Diagnosis not present

## 2023-04-14 DIAGNOSIS — K219 Gastro-esophageal reflux disease without esophagitis: Secondary | ICD-10-CM | POA: Diagnosis not present

## 2023-04-14 DIAGNOSIS — Z1211 Encounter for screening for malignant neoplasm of colon: Secondary | ICD-10-CM

## 2023-04-14 DIAGNOSIS — I10 Essential (primary) hypertension: Secondary | ICD-10-CM | POA: Insufficient documentation

## 2023-04-14 DIAGNOSIS — G4730 Sleep apnea, unspecified: Secondary | ICD-10-CM | POA: Diagnosis not present

## 2023-04-14 DIAGNOSIS — Z7984 Long term (current) use of oral hypoglycemic drugs: Secondary | ICD-10-CM | POA: Insufficient documentation

## 2023-04-14 DIAGNOSIS — I2510 Atherosclerotic heart disease of native coronary artery without angina pectoris: Secondary | ICD-10-CM | POA: Insufficient documentation

## 2023-04-14 DIAGNOSIS — E119 Type 2 diabetes mellitus without complications: Secondary | ICD-10-CM | POA: Insufficient documentation

## 2023-04-14 HISTORY — PX: COLONOSCOPY WITH PROPOFOL: SHX5780

## 2023-04-14 LAB — GLUCOSE, CAPILLARY: Glucose-Capillary: 143 mg/dL — ABNORMAL HIGH (ref 70–99)

## 2023-04-14 SURGERY — COLONOSCOPY WITH PROPOFOL
Anesthesia: General

## 2023-04-14 MED ORDER — GLYCOPYRROLATE PF 0.2 MG/ML IJ SOSY
PREFILLED_SYRINGE | INTRAMUSCULAR | Status: DC | PRN
  Administered 2023-04-14: .2 mg via INTRAVENOUS

## 2023-04-14 MED ORDER — EPHEDRINE SULFATE-NACL 50-0.9 MG/10ML-% IV SOSY
PREFILLED_SYRINGE | INTRAVENOUS | Status: DC | PRN
Start: 2023-04-14 — End: 2023-04-14
  Administered 2023-04-14: 10 mg via INTRAVENOUS

## 2023-04-14 MED ORDER — PROPOFOL 10 MG/ML IV BOLUS
INTRAVENOUS | Status: DC | PRN
  Administered 2023-04-14: 70 mg via INTRAVENOUS

## 2023-04-14 MED ORDER — LACTATED RINGERS IV SOLN: INTRAVENOUS | Status: DC

## 2023-04-14 MED ORDER — GLYCOPYRROLATE PF 0.2 MG/ML IJ SOSY
PREFILLED_SYRINGE | INTRAMUSCULAR | Status: AC
  Filled 2023-04-14: qty 1

## 2023-04-14 MED ORDER — PROPOFOL 500 MG/50ML IV EMUL
INTRAVENOUS | Status: DC | PRN
  Administered 2023-04-14: 100 ug/kg/min via INTRAVENOUS

## 2023-04-14 MED ORDER — PROPOFOL 500 MG/50ML IV EMUL
INTRAVENOUS | Status: AC
  Filled 2023-04-14: qty 50

## 2023-04-14 NOTE — Discharge Instructions (Signed)
  Colonoscopy Discharge Instructions  Read the instructions outlined below and refer to this sheet in the next few weeks. These discharge instructions provide you with general information on caring for yourself after you leave the hospital. Your doctor may also give you specific instructions. While your treatment has been planned according to the most current medical practices available, unavoidable complications occasionally occur. If you have any problems or questions after discharge, call Dr. Jena Gauss at [phone]. ACTIVITY You may resume your regular activity, but move at a slower pace for the next 24 hours.  Take frequent rest periods for the next 24 hours.  Walking will help get rid of the air and reduce the bloated feeling in your belly (abdomen).  No driving for 24 hours (because of the medicine (anesthesia) used during the test).   Do not sign any important legal documents or operate any machinery for 24 hours (because of the anesthesia used during the test).  NUTRITION Drink plenty of fluids.  You may resume your normal diet as instructed by your doctor.  Begin with a light meal and progress to your normal diet. Heavy or fried foods are harder to digest and may make you feel sick to your stomach (nauseated).  Avoid alcoholic beverages for 24 hours or as instructed.  MEDICATIONS You may resume your normal medications unless your doctor tells you otherwise.  WHAT YOU CAN EXPECT TODAY Some feelings of bloating in the abdomen.  Passage of more gas than usual.  Spotting of blood in your stool or on the toilet paper.  IF YOU HAD POLYPS REMOVED DURING THE COLONOSCOPY: No aspirin products for 7 days or as instructed.  No alcohol for 7 days or as instructed.  Eat a soft diet for the next 24 hours.  FINDING OUT THE RESULTS OF YOUR TEST Not all test results are available during your visit. If your test results are not back during the visit, make an appointment with your caregiver to find out the  results. Do not assume everything is normal if you have not heard from your caregiver or the medical facility. It is important for you to follow up on all of your test results.  SEEK IMMEDIATE MEDICAL ATTENTION IF: You have more than a spotting of blood in your stool.  Your belly is swollen (abdominal distention).  You are nauseated or vomiting.  You have a temperature over 101.  You have abdominal pain or discomfort that is severe or gets worse throughout the day.       No polyps found today  It is recommended you return for repeat colonoscopy in 5 years   at patient request, I called Sheena at [phone]-reviewed findings and recommendations

## 2023-04-14 NOTE — Transfer of Care (Signed)
Immediate Anesthesia Transfer of Care Note  Patient: [name] [name]  Procedure(s) Performed: COLONOSCOPY WITH PROPOFOL  Patient Location: Endoscopy Unit  Anesthesia Type:General  Level of Consciousness: awake, alert , and oriented  Airway & Oxygen Therapy: Patient Spontanous Breathing  Post-op Assessment: Report given to RN and Post -op Vital signs reviewed and stable  Post vital signs: Reviewed and stable  Last Vitals:  Vitals Value Taken Time  BP    Temp    Pulse    Resp    SpO2      Last Pain:  Vitals:   04/14/23 0842  PainSc: 0-No pain         Complications: No notable events documented.

## 2023-04-14 NOTE — Anesthesia Postprocedure Evaluation (Signed)
Anesthesia Post Note  Patient: [name] [name]  Procedure(s) Performed: COLONOSCOPY WITH PROPOFOL  Patient location during evaluation: PACU Anesthesia Type: General Level of consciousness: awake and alert Pain management: pain level controlled Vital Signs Assessment: post-procedure vital signs reviewed and stable Respiratory status: spontaneous breathing, nonlabored ventilation, respiratory function stable and patient connected to nasal cannula oxygen Cardiovascular status: blood pressure returned to baseline and stable Postop Assessment: no apparent nausea or vomiting Anesthetic complications: no   There were no known notable events for this encounter.   Last Vitals:  Vitals:   04/14/23 0700 04/14/23 0902  BP: 136/77 (!) 99/50  Pulse: (!) 56 64  Resp: 13 15  Temp: 37 C 36.6 C  SpO2: 95% 94%    Last Pain:  Vitals:   04/14/23 0902  TempSrc: Axillary  PainSc: 0-No pain                 Ziaire Bieser L Trentan Trippe

## 2023-04-14 NOTE — Op Note (Signed)
[facility] Patient Name: [name] [name] Procedure Date: 04/14/2023 8:19 AM MRN: [id number] Date of Birth: 10/01/1950 Attending MD: Gennette Pac , MD, [id number] CSN: [id number] Age: 72 Admit Type: Outpatient Procedure:                Colonoscopy Indications:              Screening in patient at increased risk: Family                            history of 1st-degree relative with colorectal                            cancer Providers:                Gennette Pac, MD, Edrick Kins, RN,                            Zena Amos Referring MD:             Gennette Pac, MD Medicines:                Propofol per Anesthesia Complications:            No immediate complications. Estimated Blood Loss:     Estimated blood loss: none. Procedure:                Pre-Anesthesia Assessment:                           - Prior to the procedure, a History and Physical                            was performed, and patient medications and                            allergies were reviewed. The patient's tolerance of                            previous anesthesia was also reviewed. The risks                            and benefits of the procedure and the sedation                            options and risks were discussed with the patient.                            All questions were answered, and informed consent                            was obtained. Prior Anticoagulants: The patient has                            taken no anticoagulant or antiplatelet agents. ASA  Grade Assessment: III - A patient with severe                            systemic disease. After reviewing the risks and                            benefits, the patient was deemed in satisfactory                            condition to undergo the procedure.                           After obtaining informed consent, the colonoscope                            was passed under  direct vision. Throughout the                            procedure, the patient's blood pressure, pulse, and                            oxygen saturations were monitored continuously. The                            [id number]) scope was introduced through the                            anus and advanced to the the ileocolonic                            anastomosis. The colonoscopy was performed without                            difficulty. The patient tolerated the procedure                            well. The quality of the bowel preparation was                            adequate. Surgical anastomosis with terminal ileum                            was photographed. Scope In: 8:47:56 AM Scope Out: 8:57:47 AM Scope Withdrawal Time: 0 hours 7 minutes 1 second  Total Procedure Duration: 0 hours 9 minutes 51 seconds  Findings:      The perianal and digital rectal examinations were normal.      Status post right hemicolectomy with normal-appearing anastomosis.       Residual colonic mucosa appeared normal.      The retroflexed view of the distal rectum and anal verge was normal and       showed no anal or rectal abnormalities. Impression:               - The distal rectum and anal verge are normal on  retroflexion view.                           - No specimens collected. Moderate Sedation:      Moderate (conscious) sedation was personally administered by an       anesthesia professional. The following parameters were monitored: oxygen       saturation, heart rate, blood pressure, respiratory rate, EKG, adequacy       of pulmonary ventilation, and response to care. Recommendation:           - Patient has a contact number available for                            emergencies. The signs and symptoms of potential                            delayed complications were discussed with the                            patient. Return to normal activities tomorrow.                             Written discharge instructions were provided to the                            patient.                           - Advance diet as tolerated.                           - Continue present medications.                           - Repeat colonoscopy in 5 years for screening                            purposes.                           - Return to GI office PRN. Procedure Code(s):        --- Professional ---                           [phone], Colonoscopy, flexible; diagnostic, including                            collection of specimen(s) by brushing or washing,                            when performed (separate procedure) Diagnosis Code(s):        --- Professional ---                           Z80.0, Family history of malignant neoplasm of  digestive organs CPT copyright 2022 American [employer] Association. All rights reserved. The codes documented in this report are preliminary and upon coder review may  be revised to meet current compliance requirements. Gerrit Friends. Floyd Lusignan, MD Gennette Pac, MD 04/14/2023 9:06:57 AM This report has been signed electronically. Number of Addenda: 0

## 2023-04-14 NOTE — H&P (Signed)
@LOGO @   Primary Care Physician:  Juliette Alcide, MD Primary Gastroenterologist:  Dr. Jena Gauss  Pre-Procedure History & Physical: HPI:  [name] [name] is a 72 y.o. male is here for a screening colonoscopy.  negative colonoscopy 2018.  History of appendiceal tumor removed by Dr. Lovell Sheehan.  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-grade neoplasm)   Arthritis    Coronary artery disease    NSTEMI 308657; multivessel CAD s/p post CABG (LIMA-LAD, SVG-D1, SVG-D2, SVG-OM)   Essential hypertension    GERD (gastroesophageal reflux disease)    Gout    History of kidney stones    History of peptic ulcer disease    Hyperlipidemia    Left ureteral stone    Myocardial infarction (HCC)    Nodule of right lung    CT 04-04-2017  right lower lobe multiple nodules   OSA on CPAP    Renal cyst, right    CT 04-04-2017   Renal insufficiency    Type 2 diabetes mellitus (HCC)    Wears glasses     Past Surgical History:  Procedure Laterality Date   CARDIOVASCULAR STRESS TEST  11-13-2016   dr Diona Browner   Intermediate risk nuclear study w/ medium defect of moderate severity in the basal inferoseptal, basal inferior, mid inferoseptal and mid inferior location (findings consistant w/ prior myocardial infarction)/  mild enlarged LV cavity size;  nuclear stress EF 45%   COLONOSCOPY N/A 04/13/2017   Procedure: COLONOSCOPY;  Surgeon: Franky Macho, MD;  Location: [facility];  Service: Gastroenterology;  Laterality: N/A;   CORONARY ARTERY BYPASS GRAFT N/A 04/25/2013   Procedure: CORONARY ARTERY BYPASS GRAFTING (CABG);  Surgeon: Alleen Borne, MD;  Location: [facility] OR;  Service: Open Heart Surgery;  Laterality: N/A;  Coronary artery bypass graft times four, on pump, using left internal mammary artery and right greater saphenous vein via endovein harvest.   CYSTOSCOPY WITH RETROGRADE PYELOGRAM, URETEROSCOPY AND STENT PLACEMENT Left 04/08/2017    Procedure: CYSTOSCOPY WITH RETROGRADE PYELOGRAM, URETEROSCOPY,STENT PLACEMENT;  Surgeon: Crista Elliot, MD;  Location: [facility];  Service: Urology;  Laterality: Left;   CYSTOSCOPY/URETEROSCOPY/HOLMIUM LASER/STENT PLACEMENT Left 05/17/2017   Procedure: CYSTOSCOPY / RETROGRADE PYELOGRAM / URETEROSCOPY / HOLMIUM LASER / STONE BASKETRY / STENT EXCHANGE;  Surgeon: Crista Elliot, MD;  Location: [facility] [place];  Service: Urology;  Laterality: Left;  ONLY NEEDS 45 MIN FOR PROCEDURE   ELBOW BURSA SURGERY Right 03-10-2017     at [facility]   and removal of spur   INTRAOPERATIVE TRANSESOPHAGEAL ECHOCARDIOGRAM N/A 04/25/2013   Procedure: INTRAOPERATIVE TRANSESOPHAGEAL ECHOCARDIOGRAM;  Surgeon: Alleen Borne, MD;  Location: [facility] OR;  Service: Open Heart Surgery;  Laterality: N/A;   KNEE ARTHROSCOPY Left 10/ 2017  approx.    dr Thomasena Edis   LAPAROSCOPIC RIGHT HEMI COLECTOMY Right 04/19/2017   Procedure: OPEN RIGHT HEMI COLECTOMY;  Surgeon: Lucretia Roers, MD;  Location: [facility] ORS;  Service: General;  Laterality: Right;   LAPAROSCOPY N/A 04/19/2017   Procedure: LAPAROSCOPY DIAGNOSTIC;  Surgeon: Lucretia Roers, MD;  Location: [facility];  Service: General;  Laterality: N/A;  patient knows to arrive at 6:15   LEFT HEART CATHETERIZATION WITH CORONARY ANGIOGRAM N/A 04/20/2013   Procedure: LEFT HEART CATHETERIZATION WITH CORONARY ANGIOGRAM;  Surgeon: Micheline Chapman, MD;  Location: [facility] CATH LAB;  Service: Cardiovascular;  Laterality: N/A;   NASAL SEPTUM SURGERY  1990s approx   TONSILLECTOMY  76 (age 85)   TRANSTHORACIC ECHOCARDIOGRAM  11-17-2016   dr Diona Browner   mild LVH,  ef 55-60%, LV wall motion & diastolic function indeterminant assessment,  images were inadequate/  mild LAE/  trivial Tr   TYMPANOSTOMY TUBE PLACEMENT      Prior to Admission medications   Medication Sig Start Date End Date Taking? Authorizing Provider  allopurinol (ZYLOPRIM) 300 MG tablet Take 300 mg by mouth  daily. 04/13/21  Yes [provider]  Ascorbic Acid (VITAMIN C) 1000 MG tablet Take 1,000 mg by mouth daily.   Yes [provider]  aspirin EC 81 MG tablet Take 1 tablet (81 mg total) by mouth daily. Swallow whole. 08/27/20  Yes Jonelle Sidle, MD  Blood Glucose Monitoring Suppl (ONE TOUCH ULTRA 2) w/Device KIT USE TO TEST BLOOD ONCE A DAY 02/18/22  Yes Dani Gobble, NP  empagliflozin (JARDIANCE) 10 MG TABS tablet Take 1 tablet (10 mg total) by mouth daily before breakfast. 11/30/22  Yes Sharlene Dory, NP  escitalopram (LEXAPRO) 10 MG tablet Take 10 mg by mouth at bedtime.    Yes [provider]  glucose blood (ONETOUCH ULTRA) test strip Use as instructed to monitor glucose once daily 02/18/22  Yes Dani Gobble, NP  Lancets [facility] ULTRASOFT) lancets Use as instructed to monitor glucose once daily 02/18/22  Yes Reardon, Alphonzo Lemmings J, NP  lisinopril (ZESTRIL) 2.5 MG tablet Take 1 tablet (2.5 mg total) by mouth daily. 01/21/23 04/21/23 Yes Sharlene Dory, NP  metFORMIN (GLUCOPHAGE) 500 MG tablet TAKE 1 TABLET (500 MG TOTAL) BY MOUTH DAILY. 08/24/22  Yes Dani Gobble, NP  metoprolol tartrate (LOPRESSOR) 25 MG tablet Take 0.5 tablets (12.5 mg total) by mouth 2 (two) times daily. 10/26/18  Yes Jonelle Sidle, MD  Multiple Vitamin (MULTIVITAMIN WITH MINERALS) TABS tablet Take 1 tablet by mouth daily.   Yes [provider]  Na Sulfate-K Sulfate-Mg Sulf 17.5-3.13-1.6 GM/177ML SOLN As directed 01/26/23  Yes Efraim Vanallen, Gerrit Friends, MD  Omega-3 Fatty Acids (FISH OIL) 1000 MG CPDR Take 1 capsule by mouth 2 (two) times a day.    Yes [provider]  omeprazole (PRILOSEC) 20 MG capsule Take 20 mg by mouth every morning.    Yes [provider]  pyridoxine (B-6) 200 MG tablet Take 200 mg by mouth daily.   Yes [provider]  simvastatin (ZOCOR) 10 MG tablet Take 1 tablet (10 mg total) by mouth at bedtime. 04/12/23 07/11/23 Yes Sharlene Dory, NP   ezetimibe (ZETIA) 10 MG tablet Take 1 tablet (10 mg total) by mouth daily. 09/18/22 03/17/23  Sharlene Dory, NP    Allergies as of 03/25/2023   (No Known Allergies)    Family History  Problem Relation Age of Onset   CAD Mother    CAD Father    Lung cancer Father    Colon cancer Father        72s    Social History   Socioeconomic History   Marital status: Married    Spouse name: Not on file   Number of children: Not on file   Years of education: Not on file   Highest education level: Not on file  Occupational History   Occupation: Drives truck    Employer: [employer]  Tobacco Use   Smoking status: Never   Smokeless tobacco: Never  Vaping Use   Vaping status: Never Used  Substance and Sexual Activity   Alcohol use:  No    Alcohol/week: 0.0 standard drinks of alcohol   Drug use: No   Sexual activity: Yes    Birth control/protection: None  Other Topics Concern   Not on file  Social History Narrative   Lives with wife, does not exercise but part of his job is strenuous. Works in a body-shop part-time, too.   2 of his mother's sisters and 1 brother died suddenly (family was told MI but no autopsy).    Social Determinants of Health   Financial Resource Strain: Low Risk  (05/11/2022)   Overall Financial Resource Strain (CARDIA)    Difficulty of Paying Living Expenses: Not hard at all  Food Insecurity: No Food Insecurity (03/05/2022)   Hunger Vital Sign    Worried About Running Out of Food in the Last Year: Never true    Ran Out of Food in the Last Year: Never true  Transportation Needs: No Transportation Needs (05/11/2022)   PRAPARE - Administrator, Civil Service (Medical): No    Lack of Transportation (Non-Medical): No  Physical Activity: Not on file  Stress: Not on file  Social Connections: Not on file  Intimate Partner Violence: Not on file    Review of Systems: See HPI, otherwise negative ROS  Physical Exam: BP 136/77   Pulse (!) 56    Temp 98.6 F (37 C)   Resp 13   Ht 6\' 2"  (1.88 m)   Wt 98 kg   SpO2 95%   BMI 27.74 kg/m  General:   Alert,  Well-developed, well-nourished, pleasant and cooperative in NAD Lungs:  Clear throughout to auscultation.   No wheezes, crackles, or rhonchi. No acute distress. Heart:  Regular rate and rhythm; no murmurs, clicks, rubs,  or gallops. Abdomen:  Soft, nontender and nondistended. No masses, hepatosplenomegaly or hernias noted. Normal bowel sounds, without guarding, and without rebound.    Impression/Plan: [name] [name] is now here to undergo a screening colonoscopy.   High risk screening examination.  Father with colon cancer.  Risks, benefits, limitations, imponderables and alternatives regarding colonoscopy have been reviewed with the patient. Questions have been answered. All parties agreeable.     Notice:  This dictation was prepared with Dragon dictation along with smaller phrase technology. Any transcriptional errors that result from this process are unintentional and may not be corrected upon review.

## 2023-04-18 ENCOUNTER — Other Ambulatory Visit: Payer: Self-pay | Admitting: Nurse Practitioner

## 2023-04-20 DIAGNOSIS — L603 Nail dystrophy: Secondary | ICD-10-CM | POA: Diagnosis not present

## 2023-04-20 DIAGNOSIS — L84 Corns and callosities: Secondary | ICD-10-CM | POA: Diagnosis not present

## 2023-04-20 DIAGNOSIS — E1142 Type 2 diabetes mellitus with diabetic polyneuropathy: Secondary | ICD-10-CM | POA: Diagnosis not present

## 2023-04-22 ENCOUNTER — Encounter (HOSPITAL_COMMUNITY): Payer: Self-pay | Admitting: Internal Medicine

## 2023-04-27 ENCOUNTER — Encounter: Payer: Self-pay | Admitting: Nurse Practitioner

## 2023-04-27 DIAGNOSIS — N1831 Chronic kidney disease, stage 3a: Secondary | ICD-10-CM

## 2023-04-28 MED ORDER — ONETOUCH ULTRA VI STRP: ORAL_STRIP | 6 refills | Status: AC

## 2023-04-29 DIAGNOSIS — N1831 Chronic kidney disease, stage 3a: Secondary | ICD-10-CM | POA: Diagnosis not present

## 2023-04-29 DIAGNOSIS — Z1329 Encounter for screening for other suspected endocrine disorder: Secondary | ICD-10-CM | POA: Diagnosis not present

## 2023-04-29 DIAGNOSIS — M109 Gout, unspecified: Secondary | ICD-10-CM | POA: Diagnosis not present

## 2023-04-29 DIAGNOSIS — I10 Essential (primary) hypertension: Secondary | ICD-10-CM | POA: Diagnosis not present

## 2023-04-29 DIAGNOSIS — E1169 Type 2 diabetes mellitus with other specified complication: Secondary | ICD-10-CM | POA: Diagnosis not present

## 2023-04-29 DIAGNOSIS — E782 Mixed hyperlipidemia: Secondary | ICD-10-CM | POA: Diagnosis not present

## 2023-04-29 DIAGNOSIS — Z0000 Encounter for general adult medical examination without abnormal findings: Secondary | ICD-10-CM | POA: Diagnosis not present

## 2023-06-09 ENCOUNTER — Other Ambulatory Visit: Payer: Self-pay | Admitting: Nurse Practitioner

## 2023-06-09 DIAGNOSIS — E1122 Type 2 diabetes mellitus with diabetic chronic kidney disease: Secondary | ICD-10-CM

## 2023-06-24 ENCOUNTER — Ambulatory Visit: Payer: Medicare HMO | Admitting: Nurse Practitioner

## 2023-06-27 ENCOUNTER — Other Ambulatory Visit: Payer: Self-pay | Admitting: Nurse Practitioner

## 2023-06-27 DIAGNOSIS — I5022 Chronic systolic (congestive) heart failure: Secondary | ICD-10-CM

## 2023-06-28 ENCOUNTER — Encounter: Payer: Self-pay | Admitting: Nurse Practitioner

## 2023-06-28 ENCOUNTER — Ambulatory Visit: Payer: Medicare HMO | Admitting: Nurse Practitioner

## 2023-06-28 VITALS — BP 110/64 | HR 55 | Ht 74.0 in | Wt 219.0 lb

## 2023-06-28 DIAGNOSIS — E782 Mixed hyperlipidemia: Secondary | ICD-10-CM

## 2023-06-28 DIAGNOSIS — Z7984 Long term (current) use of oral hypoglycemic drugs: Secondary | ICD-10-CM | POA: Diagnosis not present

## 2023-06-28 DIAGNOSIS — N1831 Chronic kidney disease, stage 3a: Secondary | ICD-10-CM | POA: Diagnosis not present

## 2023-06-28 DIAGNOSIS — I10 Essential (primary) hypertension: Secondary | ICD-10-CM

## 2023-06-28 DIAGNOSIS — E1122 Type 2 diabetes mellitus with diabetic chronic kidney disease: Secondary | ICD-10-CM

## 2023-06-28 LAB — POCT GLYCOSYLATED HEMOGLOBIN (HGB A1C): Hemoglobin A1C: 7.8 % — AB (ref 4.0–5.6)

## 2023-06-28 NOTE — Progress Notes (Signed)
06/28/2023        Endocrinology follow-up note     Subjective:    Patient ID: [name] [name], male    DOB: 24-Sep-1950, PCP Leandrew Koyanagi Ananias Pilgrim, MD   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-grade neoplasm)   Arthritis    Coronary artery disease    NSTEMI 102014; multivessel CAD s/p post CABG (LIMA-LAD, SVG-D1, SVG-D2, SVG-OM)   Essential hypertension    GERD (gastroesophageal reflux disease)    Gout    History of kidney stones    History of peptic ulcer disease    Hyperlipidemia    Left ureteral stone    Myocardial infarction (HCC)    Nodule of right lung    CT 04-04-2017  right lower lobe multiple nodules   OSA on CPAP    Renal cyst, right    CT 04-04-2017   Renal insufficiency    Type 2 diabetes mellitus (HCC)    Wears glasses    Past Surgical History:  Procedure Laterality Date   CARDIOVASCULAR STRESS TEST  11-13-2016   dr Diona Browner   Intermediate risk nuclear study w/ medium defect of moderate severity in the basal inferoseptal, basal inferior, mid inferoseptal and mid inferior location (findings consistant w/ prior myocardial infarction)/  mild enlarged LV cavity size;  nuclear stress EF 45%   COLONOSCOPY N/A 04/13/2017   Procedure: COLONOSCOPY;  Surgeon: Franky Macho, MD;  [facility]: [facility];  Service: Gastroenterology;  Laterality: N/A;   COLONOSCOPY WITH PROPOFOL N/A 04/14/2023   Procedure: COLONOSCOPY WITH PROPOFOL;  Surgeon: Corbin Ade, MD;  Location: [facility];  Service: Endoscopy;  Laterality: N/A;  830am, asa 3   CORONARY ARTERY BYPASS GRAFT N/A 04/25/2013   Procedure: CORONARY ARTERY BYPASS GRAFTING (CABG);  Surgeon: Alleen Borne, MD;  Location[facility];  Service: Open Heart Surgery;  Laterality: N/A;  Coronary artery bypass graft times four, on pump, using left internal mammary artery and right greater saphenous vein via endovein harvest.   CYSTOSCOPY WITH RETROGRADE  PYELOGRAM, URETEROSCOPY AND STENT PLACEMENT Left 04/08/2017   Procedure: CYSTOSCOPY WITH RETROGRADE PYELOGRAM, URETEROSCOPY,STENT PLACEMENT;  Surgeon: Crista Elliot, MD;  Location: [facility];  Service: Urology;  Laterality: Left;   CYSTOSCOPY/URETEROSCOPY/HOLMIUM LASER/STENT PLACEMENT Left 05/17/2017   Procedure: CYSTOSCOPY / RETROGRADE PYELOGRAM / URETEROSCOPY / HOLMIUM LASER / STONE BASKETRY / STENT EXCHANGE;  Surgeon: Crista Elliot, MD;  Location: [facility];  Service: Urology;  Laterality: Left;  ONLY NEEDS 45 MIN FOR PROCEDURE   ELBOW BURSA SURGERY Right 03-10-2017     at [facility]   and removal of spur   INTRAOPERATIVE TRANSESOPHAGEAL ECHOCARDIOGRAM N/A 04/25/2013   Procedure: INTRAOPERATIVE TRANSESOPHAGEAL ECHOCARDIOGRAM;  Surgeon: Alleen Borne, MD;  Location: [facility] OR;  Service: Open Heart Surgery;  Laterality: N/A;   KNEE ARTHROSCOPY Left 10/ 2017  approx.    dr Thomasena Edis   LAPAROSCOPIC RIGHT HEMI COLECTOMY Right 04/19/2017   Procedure: OPEN RIGHT HEMI COLECTOMY;  Surgeon: Lucretia Roers, MD;  Location: [facility];  Service: General;  Laterality: Right;   LAPAROSCOPY N/A 04/19/2017   Procedure: LAPAROSCOPY DIAGNOSTIC;  Surgeon: Lucretia Roers, MD;  Location: [facility];  Service: General;  Laterality: N/A;  patient knows to arrive at 6:15   LEFT HEART CATHETERIZATION WITH CORONARY ANGIOGRAM N/A 04/20/2013   Procedure: LEFT HEART CATHETERIZATION WITH CORONARY ANGIOGRAM;  Surgeon: Veverly Fells  Excell Seltzer, MD;  Location: [facility] CATH LAB;  Service: Cardiovascular;  Laterality: N/A;   NASAL SEPTUM SURGERY  1990s approx   TONSILLECTOMY  65 (age 57)   TRANSTHORACIC ECHOCARDIOGRAM  11-17-2016   dr Diona Browner   mild LVH,  ef 55-60%, LV wall motion & diastolic function indeterminant assessment,  images were inadequate/  mild LAE/  trivial Tr   TYMPANOSTOMY TUBE PLACEMENT     Social History   Socioeconomic History   Marital status: Married    Spouse name: Not on file    Number of children: Not on file   Years of education: Not on file   Highest education level: Not on file  Occupational History   Occupation: Drives truck    Employer: [employer]  Tobacco Use   Smoking status: Never   Smokeless tobacco: Never  Vaping Use   Vaping status: Never Used  Substance and Sexual Activity   Alcohol use: No    Alcohol/week: 0.0 standard drinks of alcohol   Drug use: No   Sexual activity: Yes    Birth control/protection: None  Other Topics Concern   Not on file  Social History Narrative   Lives with wife, does not exercise but part of his job is strenuous. Works in a body-shop part-time, too.   2 of his mother's sisters and 1 brother died suddenly (family was told MI but no autopsy).    Social Drivers of Corporate investment banker Strain: Low Risk  (05/11/2022)   Overall Financial Resource Strain (CARDIA)    Difficulty of Paying Living Expenses: Not hard at all  Food Insecurity: No Food Insecurity (03/05/2022)   Hunger Vital Sign    Worried About Running Out of Food in the Last Year: Never true    Ran Out of Food in the Last Year: Never true  Transportation Needs: No Transportation Needs (05/11/2022)   PRAPARE - Administrator, Civil Service (Medical): No    Lack of Transportation (Non-Medical): No  Physical Activity: Not on file  Stress: Not on file  Social Connections: Not on file   Outpatient Encounter Medications as of 06/28/2023  Medication Sig   allopurinol (ZYLOPRIM) 300 MG tablet Take 300 mg by mouth daily.   Ascorbic Acid (VITAMIN C) 1000 MG tablet Take 1,000 mg by mouth daily.   aspirin EC 81 MG tablet Take 1 tablet (81 mg total) by mouth daily. Swallow whole.   Blood Glucose Monitoring Suppl (ONE TOUCH ULTRA 2) w/Device KIT USE TO TEST BLOOD ONCE A DAY   escitalopram (LEXAPRO) 10 MG tablet Take 10 mg by mouth at bedtime.    ezetimibe (ZETIA) 10 MG tablet TAKE 1 TABLET BY MOUTH EVERY DAY   glucose blood (ONETOUCH ULTRA)  test strip Use as instructed to monitor glucose once daily   JARDIANCE 10 MG TABS tablet TAKE 1 TABLET BY MOUTH DAILY BEFORE BREAKFAST.   Lancets (ONETOUCH ULTRASOFT) lancets Use as instructed to monitor glucose once daily   metFORMIN (GLUCOPHAGE) 500 MG tablet TAKE 1 TABLET (500 MG TOTAL) BY MOUTH DAILY.   metoprolol tartrate (LOPRESSOR) 25 MG tablet Take 0.5 tablets (12.5 mg total) by mouth 2 (two) times daily.   Multiple Vitamin (MULTIVITAMIN WITH MINERALS) TABS tablet Take 1 tablet by mouth daily.   Na Sulfate-K Sulfate-Mg Sulf 17.5-3.13-1.6 GM/177ML SOLN As directed   Omega-3 Fatty Acids (FISH OIL) 1000 MG CPDR Take 1 capsule by mouth 2 (two) times a day.    omeprazole (PRILOSEC) 20 MG  capsule Take 20 mg by mouth every morning.    pyridoxine (B-6) 200 MG tablet Take 200 mg by mouth daily.   simvastatin (ZOCOR) 10 MG tablet Take 1 tablet (10 mg total) by mouth at bedtime.   lisinopril (ZESTRIL) 2.5 MG tablet Take 1 tablet (2.5 mg total) by mouth daily.   [DISCONTINUED] empagliflozin (JARDIANCE) 10 MG TABS tablet Take 1 tablet (10 mg total) by mouth daily before breakfast.   No facility-administered encounter medications on file as of 06/28/2023.   ALLERGIES: No Known Allergies VACCINATION STATUS: Immunization History  Administered Date(s) Administered   Influenza Split 02/10/2014   Influenza,inj,Quad PF,6+ Mos 04/20/2017    Diabetes He presents for his follow-up diabetic visit. He has type 2 diabetes mellitus. His disease course has been improving. There are no hypoglycemic associated symptoms. Pertinent negatives for hypoglycemia include no confusion, headaches, pallor or seizures. Pertinent negatives for diabetes include no chest pain, no fatigue, no foot ulcerations, no polydipsia, no polyphagia, no polyuria and no weakness. There are no hypoglycemic complications. Symptoms are resolved. Diabetic complications include heart disease and nephropathy. Risk factors for coronary artery  disease include diabetes mellitus, dyslipidemia, hypertension, male sex and sedentary lifestyle. Current diabetic treatment includes oral agent (monotherapy). He is compliant with treatment all of the time. His weight is fluctuating minimally. He is following a generally healthy diet. When asked about meal planning, he reported none. He has not had a previous visit with a dietitian. He never participates in exercise. (He presents today with no logs or meter to review.  His POCT A1c today is 7.8%, improving from last visit of 8.1%.  He notes he has cheated some on his diet recently from the holiday season.  ) An ACE inhibitor/angiotensin II receptor blocker is being taken. He does not see a podiatrist.Eye exam is current.  Hyperlipidemia This is a chronic problem. The current episode started more than 1 year ago. The problem is uncontrolled. Recent lipid tests were reviewed and are variable. Exacerbating diseases include chronic renal disease and diabetes. Factors aggravating his hyperlipidemia include beta blockers. Pertinent negatives include no chest pain, myalgias or shortness of breath. Current antihyperlipidemic treatment includes statins. The current treatment provides mild improvement of lipids. Compliance problems include adherence to diet and adherence to exercise.  Risk factors for coronary artery disease include diabetes mellitus, dyslipidemia, hypertension, male sex and a sedentary lifestyle.  Hypertension This is a chronic problem. The current episode started more than 1 year ago. The problem has been resolved since onset. The problem is controlled. Pertinent negatives include no chest pain, headaches, neck pain, palpitations or shortness of breath. There are no associated agents to hypertension. Risk factors for coronary artery disease include dyslipidemia, diabetes mellitus, male gender and sedentary lifestyle. Past treatments include beta blockers and ACE inhibitors. The current treatment  provides mild improvement. There are no compliance problems.  Hypertensive end-organ damage includes kidney disease and CAD/MI. Identifiable causes of hypertension include chronic renal disease.   Review of systems  Constitutional: + stable body weight,  current Body mass index is 28.12 kg/m. , no fatigue, no subjective hyperthermia, no subjective hypothermia Eyes: no blurry vision, no xerophthalmia ENT: no sore throat, no nodules palpated in throat, no dysphagia/odynophagia, no hoarseness Cardiovascular: no chest pain, no shortness of breath, no palpitations, no leg swelling Respiratory: no cough, no shortness of breath Gastrointestinal: no nausea/vomiting/diarrhea Musculoskeletal: no muscle/joint aches Skin: no rashes, no hyperemia Neurological: no tremors, no numbness, no tingling, no dizziness Psychiatric: no depression,  no anxiety    Objective:    BP 110/64 (BP Location: Left Arm, Cuff Size: Large)   Pulse (!) 55   Ht 6\' 2"  (1.88 m)   Wt 219 lb (99.3 kg)   BMI 28.12 kg/m   Wt Readings from Last 3 Encounters:  06/28/23 219 lb (99.3 kg)  04/14/23 216 lb 0.8 oz (98 kg)  04/12/23 216 lb 0.8 oz (98 kg)    BP Readings from Last 3 Encounters:  06/28/23 110/64  04/14/23 (!) 99/50  04/12/23 131/73     Physical Exam- Limited  Constitutional:  Body mass index is 28.12 kg/m. , not in acute distress, normal state of mind Eyes:  EOMI, no exophthalmos Musculoskeletal: no gross deformities, strength intact in all four extremities, no gross restriction of joint movements Skin:  no rashes, no hyperemia Neurological: no tremor with outstretched hands   Diabetic Foot Exam - Simple   No data filed     CMP     Component Value Date/Time   NA 138 04/12/2023 1033   NA 143 10/13/2021 0935   K 4.0 04/12/2023 1033   CL 104 04/12/2023 1033   CO2 23 04/12/2023 1033   GLUCOSE 236 (H) 04/12/2023 1033   BUN 16 04/12/2023 1033   BUN 16 10/13/2021 0935   CREATININE 1.29 (H)  04/12/2023 1033   CREATININE 1.29 (H) 02/01/2023 1028   CALCIUM 8.9 04/12/2023 1033   PROT 6.5 08/10/2022 1004   PROT 6.1 10/13/2021 0935   ALBUMIN 4.0 10/13/2021 0935   AST 20 08/10/2022 1004   ALT 18 08/10/2022 1004   ALKPHOS 99 10/13/2021 0935   BILITOT 0.9 08/10/2022 1004   BILITOT 0.8 10/13/2021 0935   GFRNONAA 60 (L) 04/12/2023 1033   GFRNONAA 52 (L) 01/23/2020 1002   GFRAA 47 (L) 07/24/2020 0932   GFRAA 60 01/23/2020 1002     Diabetic Labs (most recent): Lab Results  Component Value Date   HGBA1C 7.8 (A) 06/28/2023   HGBA1C 8.1 (A) 02/22/2023   HGBA1C 7.1 10/21/2022   MICROALBUR 80 02/16/2022   MICROALBUR 80 12/16/2020   MICROALBUR 7.3 09/09/2017     Lipid Panel ( most recent) Lipid Panel     Component Value Date/Time   CHOL 163 08/10/2022 1004   CHOL 161 11/20/2020 1017   TRIG 227 (H) 08/10/2022 1004   HDL 47 08/10/2022 1004   HDL 43 11/20/2020 1017   CHOLHDL 3.5 08/10/2022 1004   VLDL 21 07/02/2016 0853   LDLCALC 84 08/10/2022 1004     Assessment & Plan:   1) Type 2 diabetes mellitus with other circulatory complications , and acute on chronic renal insufficiency   His diabetes is complicated by coronary artery disease status post coronary artery bypass graft, acute on chronic renal insufficiency and patient remains at a high risk for more acute and chronic complications of diabetes which include CAD, CVA, CKD, retinopathy, and neuropathy. These are all discussed in detail with the patient.  He presents today with no logs or meter to review.  His POCT A1c today is 7.8%, improving from last visit of 8.1%.  He notes he has cheated some on his diet recently from the holiday season.    - Recent labs reviewed.   - Nutritional counseling repeated at each appointment due to patients tendency to fall back in to old habits.  - The patient admits there is a room for improvement in their diet and drink choices. -  Suggestion is made for the patient  to avoid  simple carbohydrates from their diet including Cakes, Sweet Desserts / Pastries, Ice Cream, Soda (diet and regular), Sweet Tea, Candies, Chips, Cookies, Sweet Pastries, Store Bought Juices, Alcohol in Excess of 1-2 drinks a day, Artificial Sweeteners, Coffee Creamer, and "Sugar-free" Products. This will help patient to have stable blood glucose profile and potentially avoid unintended weight gain.   - I encouraged the patient to switch to unprocessed or minimally processed complex starch and increased protein intake (animal or plant source), fruits, and vegetables.   - Patient is advised to stick to a routine mealtimes to eat 3 meals a day and avoid unnecessary snacks (to snack only to correct hypoglycemia).  - I have approached patient with the following individualized plan to manage diabetes and patient agrees.  -Given his stable glycemic profile, no changes will be made to his medications today.  He is advised to continue his Metformin 500 mg po daily with breakfast and Jardiance 10 mg po daily.    -I did encourage him to continue monitoring glucose once daily 1-2 times per week to help keep him on track.   2) BP/HTN:  His blood pressure is controlled to target.  He is advised to continue Lisinopril 5 mg po daily and Metoprolol 12.5 mg po BID.  3) Lipids/HPL:  His most recent lipid panel from 08/10/22 shows controlled LDL of 84 and elevated triglycerides of 227.  He is advised to continue Lovastatin 20 mg po daily at bedtime and fish oil supplement twice daily.  Encouraged him to avoid fried foods and butter.  He recently had labs drawn for PCP. I asked he bring copy by for [place] to scan into his records.  4) Weight management:  His Body mass index is 28.12 kg/m.-a candidate for modest weight loss.  Detailed carbohydrates and exercise regimen was discussed with him.  5) Chronic Care/Health Maintenance: -Patient is on ACE and Statin medications and encouraged to continue to follow up with  Ophthalmology, Podiatrist at least yearly or according to recommendations, and advised to  stay away from smoking. I have recommended yearly flu vaccine and pneumonia vaccination at least every 5 years; moderate intensity exercise for up to 150 minutes weekly; and  sleep for at least 7 hours a day.   - I advised patient to maintain close follow up with Burdine, Ananias Pilgrim, MD for primary care needs.     I spent  38  minutes in the care of the patient today including review of labs from CMP, Lipids, Thyroid Function, Hematology (current and previous including abstractions from other facilities); face-to-face time discussing  his blood glucose readings/logs, discussing hypoglycemia and hyperglycemia episodes and symptoms, medications doses, his options of short and long term treatment based on the latest standards of care / guidelines;  discussion about incorporating lifestyle medicine;  and documenting the encounter. Risk reduction counseling performed per USPSTF guidelines to reduce obesity and cardiovascular risk factors.     Please refer to Patient Instructions for Blood Glucose Monitoring and Insulin/Medications Dosing Guide"  in media tab for additional information. Please  also refer to " Patient Self Inventory" in the Media  tab for reviewed elements of pertinent patient history.  [name] [name] participated in the discussions, expressed understanding, and voiced agreement with the above plans.  All questions were answered to his satisfaction. he is encouraged to contact clinic should he have any questions or concerns prior to his return visit.   Follow up plan: -Return in about 4 months (  around 10/27/2023) for Diabetes F/U with A1c in office, No previsit labs, Bring meter and logs.    Ronny Bacon, [facility] [address] [place], [place] [address] Phone: [phone] Fax: [phone]  06/28/2023, 10:01 AM

## 2023-07-20 NOTE — Progress Notes (Signed)
 Referring Provider: Lari Elspeth BRAVO, MD Primary Care Physician:  Lari Elspeth BRAVO, MD Primary GI Physician: Dr. Shaaron  Chief Complaint  Patient presents with   Rectal Bleeding    Having some rectal bleeding after colonoscopy. Seeing blood on underware.     HPI:   [name] [name] is a 73 y.o. male with history of CAD/s/p CABG, heart failure with EF 45-50%, type 2 diabetes, HTN, HLD, presenting today for follow-up after screening colonoscopy with chief complaint of rectal bleeding.   Last seen in the office 01/07/2023 at the request of his PCP to schedule colonoscopy due to family history of colon cancer.  He had no significant GI symptoms.  Noted scant toilet tissue hematochezia only if wiping really hard which was chronic.  Reflux controlled on omeprazole.  Father with history of colon cancer in his 65s.  Colonoscopy 04/14/2023 with normal exam, s/p right hemicolectomy with normal-appearing anastomosis.  No anal or rectal abnormalities.  Recommended repeat colonoscopy in 5 years.  Today: Rectal bleeding started 1 to 1.5 months ago. Can be daily or skip a couple of days. Noted bright red blood in underwear and has gone onto his pants. No cloudy or white puslike drainage, just red blood.  No blood in the stool. No rectal pain. No constipation or diarrhea. Last occurrence was yesterday.   No black stool.  No abdominal pain.   NSAIDs: None aside from baby aspirin .   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-grade neoplasm)   Arthritis    Coronary artery disease    NSTEMI [id number]; multivessel CAD s/p post CABG (LIMA-LAD, SVG-D1, SVG-D2, SVG-OM)   Essential hypertension    GERD (gastroesophageal reflux disease)    Gout    History of kidney stones    History of peptic ulcer disease    Hyperlipidemia    Left ureteral stone    Myocardial infarction (HCC)    Nodule of right lung    CT 04-04-2017  right lower  lobe multiple nodules   OSA on CPAP    Renal cyst, right    CT 04-04-2017   Renal insufficiency    Type 2 diabetes mellitus (HCC)    Wears glasses     Past Surgical History:  Procedure Laterality Date   CARDIOVASCULAR STRESS TEST  11-13-2016   dr debera   Intermediate risk nuclear study w/ medium defect of moderate severity in the basal inferoseptal, basal inferior, mid inferoseptal and mid inferior location (findings consistant w/ prior myocardial infarction)/  mild enlarged LV cavity size;  nuclear stress EF 45%   COLONOSCOPY N/A 04/13/2017   Procedure: COLONOSCOPY;  Surgeon: Mavis Anes, MD;  [facility];  Service: Gastroenterology;  Laterality: N/A;   COLONOSCOPY WITH PROPOFOL  N/A 04/14/2023   Procedure: COLONOSCOPY WITH PROPOFOL ;  Surgeon: Shaaron Lamar HERO, MD;  Location: [facility];  Service: Endoscopy;  Laterality: N/A;  830am, asa 3   CORONARY ARTERY BYPASS GRAFT N/A 04/25/2013   Procedure: CORONARY ARTERY BYPASS GRAFTING (CABG);  Surgeon: Dorise MARLA Fellers, MD;  Location: [facility] OR;  Service: Open Heart Surgery;  Laterality: N/A;  Coronary artery bypass graft times four, on pump, using left internal mammary artery and right greater saphenous vein via endovein harvest.   CYSTOSCOPY WITH RETROGRADE PYELOGRAM, URETEROSCOPY AND STENT PLACEMENT Left 04/08/2017   Procedure: CYSTOSCOPY WITH RETROGRADE PYELOGRAM, URETEROSCOPY,STENT PLACEMENT;  Surgeon: Carolee Sherwood JONETTA DOUGLAS, MD;  Location: [facility];  Service: Urology;  Laterality: Left;   CYSTOSCOPY/URETEROSCOPY/HOLMIUM LASER/STENT PLACEMENT Left 05/17/2017   Procedure: CYSTOSCOPY / RETROGRADE PYELOGRAM / URETEROSCOPY / HOLMIUM LASER / STONE BASKETRY / STENT EXCHANGE;  Surgeon: Carolee Sherwood JONETTA DOUGLAS, MD;  Location: [facility];  Service: Urology;  Laterality: Left;  ONLY NEEDS 45 MIN FOR PROCEDURE   ELBOW BURSA SURGERY Right 03-10-2017     at [facility]   and removal of spur   INTRAOPERATIVE TRANSESOPHAGEAL  ECHOCARDIOGRAM N/A 04/25/2013   Procedure: INTRAOPERATIVE TRANSESOPHAGEAL ECHOCARDIOGRAM;  Surgeon: Dorise MARLA Fellers, MD;  Location: [facility] OR;  Service: Open Heart Surgery;  Laterality: N/A;   KNEE ARTHROSCOPY Left 10/ 2017  approx.    dr gerome   LAPAROSCOPIC RIGHT HEMI COLECTOMY Right 04/19/2017   Procedure: OPEN RIGHT HEMI COLECTOMY;  Surgeon: Kallie Manuelita BROCKS, MD;  Location: [facility];  Service: General;  Laterality: Right;   LAPAROSCOPY N/A 04/19/2017   Procedure: LAPAROSCOPY DIAGNOSTIC;  Surgeon: Kallie Manuelita BROCKS, MD;  Location: [facility];  Service: General;  Laterality: N/A;  patient knows to arrive at 6:15   LEFT HEART CATHETERIZATION WITH CORONARY ANGIOGRAM N/A 04/20/2013   Procedure: LEFT HEART CATHETERIZATION WITH CORONARY ANGIOGRAM;  Surgeon: Ozell JONETTA Fell, MD;  Location: [facility] CATH LAB;  Service: Cardiovascular;  Laterality: N/A;   NASAL SEPTUM SURGERY  1990s approx   TONSILLECTOMY  55 (age 57)   TRANSTHORACIC ECHOCARDIOGRAM  11-17-2016   dr debera   mild LVH,  ef 55-60%, LV wall motion & diastolic function indeterminant assessment,  images were inadequate/  mild LAE/  trivial Tr   TYMPANOSTOMY TUBE PLACEMENT      Current Outpatient Medications  Medication Sig Dispense Refill   allopurinol (ZYLOPRIM) 300 MG tablet Take 300 mg by mouth daily.     Ascorbic Acid  (VITAMIN C ) 1000 MG tablet Take 1,000 mg by mouth daily.     aspirin  EC 81 MG tablet Take 1 tablet (81 mg total) by mouth daily. Swallow whole. 90 tablet 3   Blood Glucose Monitoring Suppl (ONE TOUCH ULTRA 2) w/Device KIT USE TO TEST BLOOD ONCE A DAY 1 kit 0   escitalopram  (LEXAPRO ) 10 MG tablet Take 10 mg by mouth at bedtime.      ezetimibe  (ZETIA ) 10 MG tablet TAKE 1 TABLET BY MOUTH EVERY DAY 90 tablet 3   glucose blood (ONETOUCH ULTRA) test strip Use as instructed to monitor glucose once daily 100 strip 6   JARDIANCE  10 MG TABS tablet TAKE 1 TABLET BY MOUTH DAILY BEFORE BREAKFAST. 30 tablet 6   Lancets (ONETOUCH ULTRASOFT)  lancets Use as instructed to monitor glucose once daily 100 each 12   lisinopril  (ZESTRIL ) 2.5 MG tablet Take 1 tablet (2.5 mg total) by mouth daily. 90 tablet 3   metFORMIN  (GLUCOPHAGE ) 500 MG tablet TAKE 1 TABLET (500 MG TOTAL) BY MOUTH DAILY. 90 tablet 1   metoprolol  tartrate (LOPRESSOR ) 25 MG tablet Take 0.5 tablets (12.5 mg total) by mouth 2 (two) times daily. 90 tablet 1   Multiple Vitamin (MULTIVITAMIN WITH MINERALS) TABS tablet Take 1 tablet by mouth daily.     Na Sulfate-K Sulfate-Mg Sulf 17.5-3.13-1.6 GM/177ML SOLN As directed 354 mL 0   Omega-3 Fatty Acids (FISH OIL) 1000 MG CPDR Take 1 capsule by mouth 2 (two) times a day.      omeprazole (PRILOSEC) 20 MG capsule Take 20 mg by mouth every morning.      pyridoxine  (B-6) 200 MG tablet Take 200  mg by mouth daily.     simvastatin  (ZOCOR ) 10 MG tablet Take 1 tablet (10 mg total) by mouth at bedtime. 90 tablet 3   No current facility-administered medications for this visit.    Allergies as of 07/22/2023   (No Known Allergies)    Family History  Problem Relation Age of Onset   CAD Mother    CAD Father    Lung cancer Father    Colon cancer Father        69s    Social History   Socioeconomic History   Marital status: Married    Spouse name: Not on file   Number of children: Not on file   Years of education: Not on file   Highest education level: Not on file  Occupational History   Occupation: Drives truck    Employer: [employer]  Tobacco Use   Smoking status: Never   Smokeless tobacco: Never  Vaping Use   Vaping status: Never Used  Substance and Sexual Activity   Alcohol  use: No    Alcohol /week: 0.0 standard drinks of alcohol    Drug use: No   Sexual activity: Yes    Birth control/protection: None  Other Topics Concern   Not on file  Social History Narrative   Lives with wife, does not exercise but part of his job is strenuous. Works in a body-shop part-time, too.   2 of his mother's sisters and 1 brother  died suddenly (family was told MI but no autopsy).    Social Drivers of Corporate Investment Banker Strain: Low Risk  (05/11/2022)   Overall Financial Resource Strain (CARDIA)    Difficulty of Paying Living Expenses: Not hard at all  Food Insecurity: No Food Insecurity (03/05/2022)   Hunger Vital Sign    Worried About Running Out of Food in the Last Year: Never true    Ran Out of Food in the Last Year: Never true  Transportation Needs: No Transportation Needs (05/11/2022)   PRAPARE - Administrator, Civil Service (Medical): No    Lack of Transportation (Non-Medical): No  Physical Activity: Not on file  Stress: Not on file  Social Connections: Not on file    Review of Systems: Gen: Denies fever, chills. GI: See HPI Heme: See HPI  Physical Exam: BP 136/76 (BP Location: Right Arm, Patient Position: Sitting, Cuff Size: Large)   Pulse (!) 55   Temp 97.6 F (36.4 C) (Temporal)   Ht 6' 2 (1.88 m)   Wt 217 lb 9.6 oz (98.7 kg)   BMI 27.94 kg/m  General:   Alert and oriented. No distress noted. Pleasant and cooperative.  Head:  Normocephalic and atraumatic. Rectal: Pinpoint hole at 12 o'clock position, just anterior to anus with small amount of oozing of red blood. ?slight induration internally, anteriorly also in the 12 o clock position. No rectal pain.  Neurologic:  Alert and  oriented x4 Psych:  Normal mood and affect.   Assessment:  73 year old male with history of CAD/s/p CABG, heart failure with EF 45-50%, type 2 diabetes, HTN, HLD, presenting today for follow-up after screening colonoscopy with chief complaint of intermittent rectal bleeding x 1.5 months.  Denies any bleeding with bowel movements.  Has only noticed red blood in his underwear.  No associated rectal pain.  On exam, patient was found to have a pinpoint hole at the 12 o'clock position just anterior to the anus with small amount of oozing of red blood, possibly slight induration  internally, anteriorly  also in the 12 o'clock position.  No rectal pain.  Case was reviewed with Dr. Eartha in the absence of Dr. Shaaron.  Dr. Eartha came in to do rectal exam as well.  Ultimately, recommended pursuing pelvic MRI to rule out fistula. Also recommended considering diltiazem for fissure though this was not classic fissure, so I will hold off on this until MRI has been completed.    Plan:  Pelvic MRI with and without contrast.  Further recommendations to follow.    Josette Centers, PA-C [facility] 07/22/2023

## 2023-07-22 ENCOUNTER — Ambulatory Visit (INDEPENDENT_AMBULATORY_CARE_PROVIDER_SITE_OTHER): Payer: HMO | Admitting: Gastroenterology

## 2023-07-22 ENCOUNTER — Encounter: Payer: Self-pay | Admitting: Gastroenterology

## 2023-07-22 ENCOUNTER — Encounter: Payer: Self-pay | Admitting: *Deleted

## 2023-07-22 VITALS — BP 136/76 | HR 55 | Temp 97.6°F | Ht 74.0 in | Wt 217.6 lb

## 2023-07-22 DIAGNOSIS — K625 Hemorrhage of anus and rectum: Secondary | ICD-10-CM | POA: Diagnosis not present

## 2023-07-22 DIAGNOSIS — K629 Disease of anus and rectum, unspecified: Secondary | ICD-10-CM

## 2023-07-22 NOTE — Telephone Encounter (Signed)
 Pt informed of MRI appt date, time and location.

## 2023-07-22 NOTE — Patient Instructions (Signed)
 We will arrange for you to have an MRI of your pelvis to further evaluate rectal bleeding and the lesion that we found on exam today.  Further recommendations to follow.  Ermalinda Memos, PA-C [facility]

## 2023-07-27 ENCOUNTER — Ambulatory Visit (HOSPITAL_COMMUNITY)
Admission: RE | Admit: 2023-07-27 | Discharge: 2023-07-27 | Disposition: A | Discharge status: Home / Self Care | Payer: HMO | Source: Ambulatory Visit | Attending: Gastroenterology | Admitting: Gastroenterology

## 2023-07-27 DIAGNOSIS — K629 Disease of anus and rectum, unspecified: Secondary | ICD-10-CM | POA: Diagnosis not present

## 2023-07-27 DIAGNOSIS — K625 Hemorrhage of anus and rectum: Secondary | ICD-10-CM | POA: Diagnosis not present

## 2023-07-27 DIAGNOSIS — K5730 Diverticulosis of large intestine without perforation or abscess without bleeding: Secondary | ICD-10-CM | POA: Diagnosis not present

## 2023-07-27 DIAGNOSIS — K4090 Unilateral inguinal hernia, without obstruction or gangrene, not specified as recurrent: Secondary | ICD-10-CM | POA: Diagnosis not present

## 2023-07-27 MED ORDER — GADOBUTROL 1 MMOL/ML IV SOLN
10.0000 mL | Freq: Once | INTRAVENOUS | Status: AC | PRN
  Administered 2023-07-27: 10 mL via INTRAVENOUS

## 2023-08-05 ENCOUNTER — Other Ambulatory Visit: Payer: Self-pay | Admitting: *Deleted

## 2023-08-05 ENCOUNTER — Encounter: Payer: Self-pay | Admitting: *Deleted

## 2023-08-05 DIAGNOSIS — K603 Anal fistula, unspecified: Secondary | ICD-10-CM

## 2023-08-23 DIAGNOSIS — L570 Actinic keratosis: Secondary | ICD-10-CM | POA: Diagnosis not present

## 2023-08-23 DIAGNOSIS — X32XXXD Exposure to sunlight, subsequent encounter: Secondary | ICD-10-CM | POA: Diagnosis not present

## 2023-08-25 ENCOUNTER — Telehealth: Payer: Self-pay | Admitting: Pulmonary Disease

## 2023-08-25 DIAGNOSIS — G4733 Obstructive sleep apnea (adult) (pediatric): Secondary | ICD-10-CM

## 2023-08-25 NOTE — Telephone Encounter (Signed)
PT's insurance Co rep is calling stating PT has seen Dr. Vassie Loll and would like to have a CPAP. Please call PT to advise @ [phone]

## 2023-08-26 NOTE — Telephone Encounter (Signed)
Pt was last seen on 10-15-22 by Dr Vassie Loll. Routing to Dr Vassie Loll to see if cpap order is appropriate or if he would like to see pt first.

## 2023-08-30 NOTE — Telephone Encounter (Signed)
 Order has been placed.

## 2023-09-01 ENCOUNTER — Telehealth: Payer: Self-pay | Admitting: Pulmonary Disease

## 2023-09-01 NOTE — Telephone Encounter (Signed)
[place], Lennox [place], [place]; [place], [place]; [place], Tammy [place] Aggie Cosier, [employer] requires he have an office within 6 months of order to discuss use and benefit of cpap in order for them to cover a replacement machine.  He is going to have to have an office visit, this can be a telehealth with audio and video noted. Please advise thank you

## 2023-09-03 NOTE — Telephone Encounter (Signed)
Can you schedule this patient for visit with Vassie Loll?

## 2023-09-13 ENCOUNTER — Ambulatory Visit: Payer: Self-pay | Admitting: General Surgery

## 2023-09-13 DIAGNOSIS — K603 Anal fistula, unspecified: Secondary | ICD-10-CM | POA: Diagnosis not present

## 2023-09-13 NOTE — H&P (Signed)
 REFERRING PHYSICIAN:  Letta Median  PROVIDER:  Elenora Gamma, MD  MRN: [id number] DOB: 08-25-1950 DATE OF ENCOUNTER: 09/13/2023  Subjective   Chief Complaint: New Consultation (ANAL FISTULA, )     History of Present Illness: [name] [name] is a 73 y.o. male who is seen today as an office consultation at the request of Dr. Clearance Coots for evaluation of New Consultation (ANAL FISTULA, ) .  73 year old male who presents to the office for evaluation of bloody drainage noted perianally the past couple months.  He has been having regular bowel movements.  He was seen by his gastroenterologist and a small lesion was noted anteriorly.  A pelvic MRI was obtained.  This showed a left-sided intersphincteric anal fistula.   Review of Systems: A complete review of systems was obtained from the patient.  I have reviewed this information and discussed as appropriate with the patient.  See HPI as well for other ROS.   Medical History: Past Medical History:  Diagnosis Date   Anxiety    CHF (congestive heart failure) (CMS/HHS-HCC)    Chronic kidney disease    Diabetes mellitus without complication (CMS/HHS-HCC)    GERD (gastroesophageal reflux disease)    Hypertension    Sleep apnea     Patient Active Problem List  Diagnosis   OSA on CPAP   S/P CABG x 4   Type 2 diabetes mellitus (CMS/HHS-HCC)    History reviewed. No pertinent surgical history.   No Known Allergies  Current Outpatient Medications on File Prior to Visit  Medication Sig Dispense Refill   allopurinoL (ZYLOPRIM) 300 MG tablet Take 300 mg by mouth once daily     ezetimibe (ZETIA) 10 mg tablet Take 10 mg by mouth once daily     JARDIANCE 10 mg tablet Take 10 mg by mouth every morning before breakfast (0630)     lisinopriL (ZESTRIL) 5 MG tablet Take 5 mg by mouth once daily     metFORMIN (GLUCOPHAGE) 500 MG tablet Take 500 mg by mouth once daily     metoprolol TARTrate (LOPRESSOR) 25 MG tablet Take 12.5 mg by  mouth 2 (two) times daily     omeprazole (PRILOSEC) 20 MG DR capsule Take 20 mg by mouth once daily     simvastatin (ZOCOR) 10 MG tablet Take 10 mg by mouth at bedtime     No current facility-administered medications on file prior to visit.    Family History  Problem Relation Age of Onset   High blood pressure (Hypertension) Mother    Hyperlipidemia (Elevated cholesterol) Mother    Coronary Artery Disease (Blocked arteries around heart) Mother    Hyperlipidemia (Elevated cholesterol) Father    Diabetes Father    Deep vein thrombosis (DVT or abnormal blood clot formation) Father      Social History   Tobacco Use  Smoking Status Never  Smokeless Tobacco Never     Social History   Socioeconomic History   Marital status: Married  Tobacco Use   Smoking status: Never   Smokeless tobacco: Never  Vaping Use   Vaping status: Never Used  Substance and Sexual Activity   Alcohol use: Not Currently   Drug use: Never   Social Drivers of Health   Financial Resource Strain: Low Risk  (05/11/2022)   Received from [facility]   Overall Financial Resource Strain (CARDIA)    Difficulty of Paying Living Expenses: Not hard at all  Food Insecurity: No Food Insecurity (03/05/2022)   Received from  [facility]   Hunger Vital Sign    Worried About Running Out of Food in the Last Year: Never true    Ran Out of Food in the Last Year: Never true  Transportation Needs: No Transportation Needs (05/11/2022)   Received from [facility]    Lack of Transportation (Medical): No    Lack of Transportation (Non-Medical): No  Housing Stability: Unknown (09/13/2023)   Housing Stability Vital Sign    Homeless in the Last Year: No    Objective:    Vitals:   09/13/23 1333  BP: (!) 145/78  Pulse: 57  Temp: 36.7 C (98.1 F)  SpO2: 97%  Weight: 95.3 kg (210 lb 3.2 oz)  Height: 188 cm (6\' 2" )  PainSc: 0-No pain     Exam Gen: NAD Abd: soft Rectal: possible L lateral  palpable cord, anterior anal external opening    Labs, Imaging and Diagnostic Testing:   Assessment and Plan:  Diagnoses and all orders for this visit:  Anal fistula     73 year old male with anal fistula noted on MRI.  I have recommended an exam under anesthesia with interrogation of anal fistula and probable fistulotomy.  We have discussed this in detail including risk and postoperative pain.  Risk of recurrence is low.  We did discuss that there is a small risk of incontinence but looking at the size of the fistula I do not think this will be a major issue for him.  Vanita Panda, MD Colon and Rectal Surgery [facility]

## 2023-09-14 ENCOUNTER — Telehealth: Payer: Self-pay | Admitting: Cardiology

## 2023-09-14 NOTE — Telephone Encounter (Signed)
   Name: [name] [name]  DOB: [birth year]  MRN: [id number]  Primary Cardiologist: Nona Dell, MD  Chart reviewed as part of pre-operative protocol coverage. The patient has an upcoming visit scheduled with Sharlene Dory, NP on 09/21/2023 at which time clearance can be addressed in case there are any issues that would impact surgical recommendations.  EUA fistulotomy is not scheduled until TBD as below. I added preop FYI to appointment note so that provider is aware to address at time of outpatient visit.  Per office protocol the cardiology provider should forward their finalized clearance decision and recommendations regarding antiplatelet therapy to the requesting party below.    I will route this message as FYI to requesting party and remove this message from the preop box as separate preop APP input not needed at this time.   Please call with any questions.  Joylene Grapes, NP  09/14/2023, 10:57 AM

## 2023-09-14 NOTE — Telephone Encounter (Signed)
   Pre-operative Risk Assessment    Patient Name: [name] [name]  DOB: 11-Feb-1951 MRN: [id number]      Request for Surgical Clearance    Procedure:   EUA Fistulotomy   Date of Surgery:  Clearance TBD                                 Surgeon:  Dr. Ashok Pall Group or Practice Name:  [facility]  Phone number:  [phone] Fax number:  [phone]   Type of Clearance Requested:   - Medical    Type of Anesthesia:  MAC   Additional requests/questions:    Lajuana Matte   09/14/2023, 10:17 AM

## 2023-09-17 DIAGNOSIS — G4733 Obstructive sleep apnea (adult) (pediatric): Secondary | ICD-10-CM | POA: Diagnosis not present

## 2023-09-21 ENCOUNTER — Encounter: Payer: Self-pay | Admitting: Nurse Practitioner

## 2023-09-21 ENCOUNTER — Ambulatory Visit: Payer: Medicare HMO | Attending: Nurse Practitioner | Admitting: Nurse Practitioner

## 2023-09-21 VITALS — BP 108/60 | HR 64 | Ht 74.0 in | Wt 212.0 lb

## 2023-09-21 DIAGNOSIS — E782 Mixed hyperlipidemia: Secondary | ICD-10-CM | POA: Diagnosis not present

## 2023-09-21 DIAGNOSIS — I5022 Chronic systolic (congestive) heart failure: Secondary | ICD-10-CM

## 2023-09-21 DIAGNOSIS — Z951 Presence of aortocoronary bypass graft: Secondary | ICD-10-CM

## 2023-09-21 DIAGNOSIS — I5020 Unspecified systolic (congestive) heart failure: Secondary | ICD-10-CM

## 2023-09-21 DIAGNOSIS — I10 Essential (primary) hypertension: Secondary | ICD-10-CM | POA: Diagnosis not present

## 2023-09-21 DIAGNOSIS — Z01810 Encounter for preprocedural cardiovascular examination: Secondary | ICD-10-CM | POA: Diagnosis not present

## 2023-09-21 DIAGNOSIS — E1159 Type 2 diabetes mellitus with other circulatory complications: Secondary | ICD-10-CM

## 2023-09-21 DIAGNOSIS — I2510 Atherosclerotic heart disease of native coronary artery without angina pectoris: Secondary | ICD-10-CM

## 2023-09-21 DIAGNOSIS — G4733 Obstructive sleep apnea (adult) (pediatric): Secondary | ICD-10-CM | POA: Diagnosis not present

## 2023-09-21 NOTE — Patient Instructions (Signed)
 Medication Instructions:  Your physician recommends that you continue on your current medications as directed. Please refer to the Current Medication list given to you today.  Labwork: 1-2 weeks at [facility]   Testing/Procedures: None   Follow-Up: Your physician recommends that you schedule a follow-up appointment in: 6 Months   Any Other Special Instructions Will Be Listed Below (If Applicable).  If you need a refill on your cardiac medications before your next appointment, please call your pharmacy.

## 2023-09-21 NOTE — Progress Notes (Unsigned)
 Cardiology Office Note:  .   Date:  03/17/2023 ID:  [name] [name], DOB Dec 03, 1950, MRN [id number] PCP: [name] Alcide, MD  [facility] Providers Cardiologist:  [name] Dell, MD    History of Present Illness: .   [name] [name] is a 73 y.o. male with a PMH of CAD, s/p CABG x4, HFmrEF, mixed HLD, HTN, T2DM, OSA on CPAP, who presents today for follow-up.   Last saw patient on January 21, 2023.  Was doing well at the time.  Did admit to occasional dizziness when standing up. Lisinopril decreased to 2.5 mg daily and Lopressor held x 1 week to improve symptoms of dizziness.  Today he presents for follow-up.  He is overall doing well.  Denies any chest pain, shortness of breath, palpitations, syncope, presyncope, orthopnea, PND, denies any recent dizziness, swelling or significant weight changes, acute bleeding, or claudication. Weight is stable per our records.   Colonoscopy -   09/21/2023 -   Mr. [name] [name] perioperative risk of a major cardiac event is 6.6% according to the Revised Cardiac Risk Index (RCRI).  Therefore, he is at high risk for perioperative complications.   His functional capacity is excellent at 6.61 METs according to the Duke Activity Status Index (DASI). Recommendations: According to ACC/AHA guidelines, no further cardiovascular testing needed.  The patient may proceed to surgery at acceptable risk.   Antiplatelet and/or Anticoagulation Recommendations: The patient should remain on Aspirin without interruption.  Will route this note to the requesting party.     SH: Works at [employer]. Enjoys riding his motorcycle in his free time.   Studies Reviewed: .    Echocardiogram on August 25, 2022:  1. Left ventricular ejection fraction, by estimation, is 45 to 50%. The  left ventricle has mildly decreased function. The left ventricle has no  regional wall motion abnormalities. Left ventricular diastolic parameters  are indeterminate.   2. Right  ventricular systolic function is mildly reduced. The right  ventricular size is normal.   3. Left atrial size was mildly dilated.   4. The mitral valve is normal in structure. Trivial mitral valve  regurgitation. No evidence of mitral stenosis.   5. The aortic valve is tricuspid. There is mild calcification of the  aortic valve. Aortic valve regurgitation is not visualized. No aortic  stenosis is present.   Comparison(s): No prior Echocardiogram.      Lexiscan on August 12, 2022:   Pharmacological stress ECG is negative for ischemia. Rest and stress ECG positive for diffuse T wave inversions, 1 mm. No chest pain during the test.   LV perfusion is abnormal. There is a small reversible perfusion defect present in the mid inferolateral location consistent with ischemia. There is a medium fixed perfusion defect present in the mid to basal inferior location consistent with infarction.   Nuclear stress EF: 36 %. The left ventricular ejection fraction is moderately decreased (30-44%). End diastolic cavity size is moderately enlarged. End systolic cavity size is moderately enlarged.   Findings are consistent with ischemia and infarction. The study is intermediate risk.     Physical Exam:   VS:  BP 108/60   Pulse 64   Ht 6\' 2"  (1.88 m)   Wt 212 lb (96.2 kg)   SpO2 96%   BMI 27.22 kg/m    Wt Readings from Last 3 Encounters:  09/21/23 212 lb (96.2 kg)  07/22/23 217 lb 9.6 oz (98.7 kg)  06/28/23 219 lb (99.3 kg)  GEN: Well nourished, well developed in no acute distress NECK: No JVD; No carotid bruits CARDIAC: S1/S2, RRR, no murmurs, rubs, gallops RESPIRATORY:  Clear to auscultation without rales, wheezing or rhonchi  ABDOMEN: Soft, non-tender, non-distended EXTREMITIES:  No edema; No deformity   ASSESSMENT AND PLAN: .   1. HFmrEF Stage C, NYHA class I symptoms. TTE 08/2022 revealed EF 45 to 50%. Euvolemic and well compensated on exam.  Continue current GDMT. GDMT limited d/t current BP.   Low sodium diet, fluid restriction <2L, and daily weights encouraged. Educated to contact our office for weight gain of 2 lbs overnight or 5 lbs in one week.    2. CAD, s/p CABG x 4 No chest pain.  NST 07/2022 was deemed intermediate risk, findings consistent with ischemia and infarction.  Hx of microvascular CAD seen on cath, was not able to do intervention.  TTE 08/2022 revealed mildly reduced EF.  Continue aspirin, Zetia, Lopressor, and Lisinopril.  Increasing lovastatin as mentioned below.  Heart healthy diet and regular cardiovascular exercise encouraged. ED precautions discussed.     3. Mixed hyperlipidemia Previous LDL was 84.  LDL goal < 55.  Will increase lovastatin to 40 mg daily and continue Zetia.  Heart healthy diet and regular cardiovascular exercise encouraged.  Will obtain FLP and LFT in 6 to 8 weeks per protocol.  4. Hypertension Blood pressure stable. Discussed to monitor BP at home at least 2 hours after medications and sitting for 5-10 minutes.  Continue current medication regimen.  Heart healthy diet and regular cardiovascular exercise encouraged.     5. OSA Previously referred to pulmonology.  Sees Dr. Vassie Loll.  Continue follow-up with pulmonology.  Heart healthy diet and regular cardiovascular exercise encouraged.   Dispo: Follow-up with Dr. Diona Browner or APP in 6 months or sooner if anything changes.   Signed, Sharlene Dory, NP

## 2023-09-25 DIAGNOSIS — E663 Overweight: Secondary | ICD-10-CM | POA: Diagnosis not present

## 2023-09-25 DIAGNOSIS — E1169 Type 2 diabetes mellitus with other specified complication: Secondary | ICD-10-CM | POA: Diagnosis not present

## 2023-09-25 DIAGNOSIS — D6869 Other thrombophilia: Secondary | ICD-10-CM | POA: Diagnosis not present

## 2023-09-25 DIAGNOSIS — F419 Anxiety disorder, unspecified: Secondary | ICD-10-CM | POA: Diagnosis not present

## 2023-09-25 DIAGNOSIS — E1151 Type 2 diabetes mellitus with diabetic peripheral angiopathy without gangrene: Secondary | ICD-10-CM | POA: Diagnosis not present

## 2023-09-25 DIAGNOSIS — G4733 Obstructive sleep apnea (adult) (pediatric): Secondary | ICD-10-CM | POA: Diagnosis not present

## 2023-09-25 DIAGNOSIS — I70212 Atherosclerosis of native arteries of extremities with intermittent claudication, left leg: Secondary | ICD-10-CM | POA: Diagnosis not present

## 2023-09-25 DIAGNOSIS — E1142 Type 2 diabetes mellitus with diabetic polyneuropathy: Secondary | ICD-10-CM | POA: Diagnosis not present

## 2023-09-25 DIAGNOSIS — E785 Hyperlipidemia, unspecified: Secondary | ICD-10-CM | POA: Diagnosis not present

## 2023-09-25 DIAGNOSIS — E1165 Type 2 diabetes mellitus with hyperglycemia: Secondary | ICD-10-CM | POA: Diagnosis not present

## 2023-09-25 DIAGNOSIS — D8481 Immunodeficiency due to conditions classified elsewhere: Secondary | ICD-10-CM | POA: Diagnosis not present

## 2023-09-25 DIAGNOSIS — I4891 Unspecified atrial fibrillation: Secondary | ICD-10-CM | POA: Diagnosis not present

## 2023-09-28 DIAGNOSIS — I10 Essential (primary) hypertension: Secondary | ICD-10-CM | POA: Diagnosis not present

## 2023-09-28 DIAGNOSIS — E782 Mixed hyperlipidemia: Secondary | ICD-10-CM | POA: Diagnosis not present

## 2023-09-28 DIAGNOSIS — E1159 Type 2 diabetes mellitus with other circulatory complications: Secondary | ICD-10-CM | POA: Diagnosis not present

## 2023-09-28 DIAGNOSIS — Z951 Presence of aortocoronary bypass graft: Secondary | ICD-10-CM | POA: Diagnosis not present

## 2023-09-28 DIAGNOSIS — E1142 Type 2 diabetes mellitus with diabetic polyneuropathy: Secondary | ICD-10-CM | POA: Diagnosis not present

## 2023-09-28 DIAGNOSIS — L84 Corns and callosities: Secondary | ICD-10-CM | POA: Diagnosis not present

## 2023-09-28 DIAGNOSIS — L603 Nail dystrophy: Secondary | ICD-10-CM | POA: Diagnosis not present

## 2023-09-29 LAB — COMPLETE METABOLIC PANEL WITH GFR
AG Ratio: 1.8 (calc) (ref 1.0–2.5)
ALT: 19 U/L (ref 9–46)
AST: 19 U/L (ref 10–35)
Albumin: 4.2 g/dL (ref 3.6–5.1)
Alkaline phosphatase (APISO): 90 U/L (ref 35–144)
BUN/Creatinine Ratio: 13 (calc) (ref 6–22)
BUN: 17 mg/dL (ref 7–25)
CO2: 28 mmol/L (ref 20–32)
Calcium: 9.7 mg/dL (ref 8.6–10.3)
Chloride: 104 mmol/L (ref 98–110)
Creat: 1.35 mg/dL — ABNORMAL HIGH (ref 0.70–1.28)
Globulin: 2.4 g/dL (ref 1.9–3.7)
Glucose, Bld: 172 mg/dL — ABNORMAL HIGH (ref 65–139)
Potassium: 4.2 mmol/L (ref 3.5–5.3)
Sodium: 140 mmol/L (ref 135–146)
Total Bilirubin: 1 mg/dL (ref 0.2–1.2)
Total Protein: 6.6 g/dL (ref 6.1–8.1)

## 2023-09-29 LAB — LIPID PANEL
Cholesterol: 141 mg/dL (ref <200)
HDL: 45 mg/dL (ref >=40)
LDL Cholesterol (Calc): 66 mg/dL
Non-HDL Cholesterol (Calc): 96 mg/dL (ref <130)
Total CHOL/HDL Ratio: 3.1 (calc) (ref <5.0)
Triglycerides: 243 mg/dL — ABNORMAL HIGH (ref <150)

## 2023-10-14 ENCOUNTER — Encounter (HOSPITAL_BASED_OUTPATIENT_CLINIC_OR_DEPARTMENT_OTHER): Payer: Self-pay | Admitting: Pulmonary Disease

## 2023-10-14 ENCOUNTER — Ambulatory Visit (HOSPITAL_BASED_OUTPATIENT_CLINIC_OR_DEPARTMENT_OTHER): Payer: PPO | Admitting: Pulmonary Disease

## 2023-10-18 DIAGNOSIS — G4733 Obstructive sleep apnea (adult) (pediatric): Secondary | ICD-10-CM | POA: Diagnosis not present

## 2023-10-28 ENCOUNTER — Encounter: Payer: Self-pay | Admitting: Nurse Practitioner

## 2023-10-28 ENCOUNTER — Ambulatory Visit (INDEPENDENT_AMBULATORY_CARE_PROVIDER_SITE_OTHER): Payer: Medicare HMO | Admitting: Nurse Practitioner

## 2023-10-28 VITALS — BP 116/78 | HR 67 | Ht 74.0 in | Wt 214.6 lb

## 2023-10-28 DIAGNOSIS — I5022 Chronic systolic (congestive) heart failure: Secondary | ICD-10-CM | POA: Diagnosis not present

## 2023-10-28 DIAGNOSIS — Z7984 Long term (current) use of oral hypoglycemic drugs: Secondary | ICD-10-CM | POA: Diagnosis not present

## 2023-10-28 DIAGNOSIS — E1122 Type 2 diabetes mellitus with diabetic chronic kidney disease: Secondary | ICD-10-CM

## 2023-10-28 DIAGNOSIS — N1831 Chronic kidney disease, stage 3a: Secondary | ICD-10-CM

## 2023-10-28 DIAGNOSIS — E782 Mixed hyperlipidemia: Secondary | ICD-10-CM

## 2023-10-28 DIAGNOSIS — I10 Essential (primary) hypertension: Secondary | ICD-10-CM

## 2023-10-28 LAB — POCT GLYCOSYLATED HEMOGLOBIN (HGB A1C): Hemoglobin A1C: 7.3 % — AB (ref 4.0–5.6)

## 2023-10-28 MED ORDER — EMPAGLIFLOZIN 10 MG PO TABS: 10.0000 mg | ORAL_TABLET | Freq: Every day | ORAL | 3 refills | Status: DC

## 2023-10-28 MED ORDER — METFORMIN HCL 500 MG PO TABS: 500.0000 mg | ORAL_TABLET | Freq: Every day | ORAL | 1 refills | Status: DC

## 2023-10-28 NOTE — Progress Notes (Signed)
 10/28/2023        Endocrinology follow-up note     Subjective:    Patient ID: [name] [name], male    DOB: December 26, 1950, PCP Herman Longs Maceo Sax, MD   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-grade neoplasm)   Arthritis    Coronary artery disease    NSTEMI 102014; multivessel CAD s/p post CABG (LIMA-LAD, SVG-D1, SVG-D2, SVG-OM)   Essential hypertension    GERD (gastroesophageal reflux disease)    Gout    History of kidney stones    History of peptic ulcer disease    Hyperlipidemia    Left ureteral stone    Myocardial infarction (HCC)    Nodule of right lung    CT 04-04-2017  right lower lobe multiple nodules   OSA on CPAP    Renal cyst, right    CT 04-04-2017   Renal insufficiency    Type 2 diabetes mellitus (HCC)    Wears glasses    Past Surgical History:  Procedure Laterality Date   CARDIOVASCULAR STRESS TEST  11-13-2016   dr Londa Rival   Intermediate risk nuclear study w/ medium defect of moderate severity in the basal inferoseptal, basal inferior, mid inferoseptal and mid inferior location (findings consistant w/ prior myocardial infarction)/  mild enlarged LV cavity size;  nuclear stress EF 45%   COLONOSCOPY N/A 04/13/2017   Procedure: COLONOSCOPY;  Surgeon: Alanda Allegra, MD;  Location: [facility];  Service: Gastroenterology;  Laterality: N/A;   COLONOSCOPY WITH PROPOFOL N/A 04/14/2023   Procedure: COLONOSCOPY WITH PROPOFOL;  Surgeon: Suzette Espy, MD;  Location: [facility];  Service: Endoscopy;  Laterality: N/A;  830am, asa 3   CORONARY ARTERY BYPASS GRAFT N/A 04/25/2013   Procedure: CORONARY ARTERY BYPASS GRAFTING (CABG);  Surgeon: Bartley Lightning, MD;  Location: [facility] OR;  Service: Open Heart Surgery;  Laterality: N/A;  Coronary artery bypass graft times four, on pump, using left internal mammary artery and right greater saphenous vein via endovein harvest.   CYSTOSCOPY WITH RETROGRADE  PYELOGRAM, URETEROSCOPY AND STENT PLACEMENT Left 04/08/2017   Procedure: CYSTOSCOPY WITH RETROGRADE PYELOGRAM, URETEROSCOPY,STENT PLACEMENT;  Surgeon: Samson Croak, MD;  Location: [facility];  Service: Urology;  Laterality: Left;   CYSTOSCOPY/URETEROSCOPY/HOLMIUM LASER/STENT PLACEMENT Left 05/17/2017   Procedure: CYSTOSCOPY / RETROGRADE PYELOGRAM / URETEROSCOPY / HOLMIUM LASER / STONE BASKETRY / STENT EXCHANGE;  Surgeon: Samson Croak, MD;  Location: [facility] [place];  Service: Urology;  Laterality: Left;  ONLY NEEDS 45 MIN FOR PROCEDURE   ELBOW BURSA SURGERY Right 03-10-2017     at [facility]   and removal of spur   INTRAOPERATIVE TRANSESOPHAGEAL ECHOCARDIOGRAM N/A 04/25/2013   Procedure: INTRAOPERATIVE TRANSESOPHAGEAL ECHOCARDIOGRAM;  Surgeon: Bartley Lightning, MD;  Location: [facility] OR;  Service: Open Heart Surgery;  Laterality: N/A;   KNEE ARTHROSCOPY Left 10/ 2017  approx.    dr Hazeline Lister   LAPAROSCOPIC RIGHT HEMI COLECTOMY Right 04/19/2017   Procedure: OPEN RIGHT HEMI COLECTOMY;  Surgeon: Awilda Bogus, MD;  Location: [facility];  Service: General;  Laterality: Right;   LAPAROSCOPY N/A 04/19/2017   Procedure: LAPAROSCOPY DIAGNOSTIC;  Surgeon: Awilda Bogus, MD;  Location: [facility];  Service: General;  Laterality: N/A;  patient knows to arrive at 6:15   LEFT HEART CATHETERIZATION WITH CORONARY ANGIOGRAM N/A 04/20/2013   Procedure: LEFT HEART CATHETERIZATION WITH CORONARY ANGIOGRAM;  Surgeon: Abe Hodgkins  Excell Seltzer, MD;  Location: [facility] C[facility];  Service: Cardiovascular;  Laterality: N/A;   NASAL SEPTUM SURGERY  1990s approx   TONSILLECTOMY  61 (age 50)   TRANSTHORACIC ECHOCARDIOGRAM  11-17-2016   dr Diona Browner   mild LVH,  ef 55-60%, LV wall motion & diastolic function indeterminant assessment,  images were inadequate/  mild LAE/  trivial Tr   TYMPANOSTOMY TUBE PLACEMENT     Social History   Socioeconomic History   Marital status: Married    Spouse name: Not on file    Number of children: Not on file   Years of education: Not on file   Highest education level: Not on file  Occupational History   Occupation: Drives truck    Employer: [employer]  Tobacco Use   Smoking status: Never   Smokeless tobacco: Never  Vaping Use   Vaping status: Never Used  Substance and Sexual Activity   Alcohol use: No    Alcohol/week: 0.0 standard drinks of alcohol   Drug use: No   Sexual activity: Yes    Birth control/protection: None  Other Topics Concern   Not on file  Social History Narrative   Lives with wife, does not exercise but part of his job is strenuous. Works in a body-shop part-time, too.   2 of his mother's sisters and 1 brother died suddenly (family was told MI but no autopsy).    Social Drivers of Corporate investment banker Strain: Low Risk  (05/11/2022)   Overall Financial Resource Strain (CARDIA)    Difficulty of Paying Living Expenses: Not hard at all  Food Insecurity: No Food Insecurity (03/05/2022)   Hunger Vital Sign    Worried About Running Out of Food in the Last Year: Never true    Ran Out of Food in the Last Year: Never true  Transportation Needs: No Transportation Needs (05/11/2022)   PRAPARE - Administrator, Civil Service (Medical): No    Lack of Transportation (Non-Medical): No  Physical Activity: Not on file  Stress: Not on file  Social Connections: Not on file   Outpatient Encounter Medications as of 10/28/2023  Medication Sig   allopurinol (ZYLOPRIM) 300 MG tablet Take 300 mg by mouth daily.   Ascorbic Acid (VITAMIN C) 1000 MG tablet Take 1,000 mg by mouth daily.   aspirin EC 81 MG tablet Take 1 tablet (81 mg total) by mouth daily. Swallow whole.   Blood Glucose Monitoring Suppl (ONE TOUCH ULTRA 2) w/Device KIT USE TO TEST BLOOD ONCE A DAY   escitalopram (LEXAPRO) 10 MG tablet Take 10 mg by mouth at bedtime.    ezetimibe (ZETIA) 10 MG tablet TAKE 1 TABLET BY MOUTH EVERY DAY   glucose blood (ONETOUCH ULTRA)  test strip Use as instructed to monitor glucose once daily   Lancets (ONETOUCH ULTRASOFT) lancets Use as instructed to monitor glucose once daily   lisinopril (ZESTRIL) 2.5 MG tablet Take 1 tablet (2.5 mg total) by mouth daily.   metoprolol tartrate (LOPRESSOR) 25 MG tablet Take 0.5 tablets (12.5 mg total) by mouth 2 (two) times daily.   Multiple Vitamin (MULTIVITAMIN WITH MINERALS) TABS tablet Take 1 tablet by mouth daily.   Omega-3 Fatty Acids (FISH OIL) 1000 MG CPDR Take 1 capsule by mouth 2 (two) times a day.    omeprazole (PRILOSEC) 20 MG capsule Take 20 mg by mouth every morning.    pyridoxine (B-6) 200 MG tablet Take 200 mg by mouth daily.   simvastatin (ZOCOR)  10 MG tablet Take 1 tablet (10 mg total) by mouth at bedtime.   [DISCONTINUED] JARDIANCE 10 MG TABS tablet TAKE 1 TABLET BY MOUTH DAILY BEFORE BREAKFAST.   [DISCONTINUED] metFORMIN (GLUCOPHAGE) 500 MG tablet TAKE 1 TABLET (500 MG TOTAL) BY MOUTH DAILY.   empagliflozin (JARDIANCE) 10 MG TABS tablet Take 1 tablet (10 mg total) by mouth daily before breakfast.   metFORMIN (GLUCOPHAGE) 500 MG tablet Take 1 tablet (500 mg total) by mouth daily.   No facility-administered encounter medications on file as of 10/28/2023.   ALLERGIES: No Known Allergies VACCINATION STATUS: Immunization History  Administered Date(s) Administered   Influenza Split 02/10/2014   Influenza,inj,Quad PF,6+ Mos 04/20/2017    Diabetes He presents for his follow-up diabetic visit. He has type 2 diabetes mellitus. His disease course has been improving. There are no hypoglycemic associated symptoms. Pertinent negatives for hypoglycemia include no confusion, headaches, pallor or seizures. Pertinent negatives for diabetes include no chest pain, no fatigue, no foot ulcerations, no polydipsia, no polyphagia, no polyuria and no weakness. There are no hypoglycemic complications. Symptoms are resolved. Diabetic complications include heart disease and nephropathy. Risk  factors for coronary artery disease include diabetes mellitus, dyslipidemia, hypertension, male sex and sedentary lifestyle. Current diabetic treatment includes oral agent (dual therapy). He is compliant with treatment all of the time. His weight is fluctuating minimally. He is following a generally healthy diet. When asked about meal planning, he reported none. He has not had a previous visit with a dietitian. He never participates in exercise. (He presents today with no logs or meter to review.  His POCT A1c today is 7.3%, improving from last visit of 7.8%.  He has been doing better with his diet and increasing his physical exercise.) An ACE inhibitor/angiotensin II receptor blocker is being taken. He does not see a podiatrist.Eye exam is current.  Hyperlipidemia This is a chronic problem. The current episode started more than 1 year ago. The problem is uncontrolled. Recent lipid tests were reviewed and are variable. Exacerbating diseases include chronic renal disease and diabetes. Factors aggravating his hyperlipidemia include beta blockers. Pertinent negatives include no chest pain, myalgias or shortness of breath. Current antihyperlipidemic treatment includes statins. The current treatment provides mild improvement of lipids. Compliance problems include adherence to diet and adherence to exercise.  Risk factors for coronary artery disease include diabetes mellitus, dyslipidemia, hypertension, male sex and a sedentary lifestyle.  Hypertension This is a chronic problem. The current episode started more than 1 year ago. The problem has been resolved since onset. The problem is controlled. Pertinent negatives include no chest pain, headaches, neck pain, palpitations or shortness of breath. There are no associated agents to hypertension. Risk factors for coronary artery disease include dyslipidemia, diabetes mellitus, male gender and sedentary lifestyle. Past treatments include beta blockers and ACE inhibitors.  The current treatment provides mild improvement. There are no compliance problems.  Hypertensive end-organ damage includes kidney disease and CAD/MI. Identifiable causes of hypertension include chronic renal disease.   Review of systems  Constitutional: + stable body weight,  current Body mass index is 27.55 kg/m. , no fatigue, no subjective hyperthermia, no subjective hypothermia Eyes: no blurry vision, no xerophthalmia ENT: no sore throat, no nodules palpated in throat, no dysphagia/odynophagia, no hoarseness Cardiovascular: no chest pain, no shortness of breath, no palpitations, no leg swelling Respiratory: no cough, no shortness of breath Gastrointestinal: no nausea/vomiting/diarrhea Musculoskeletal: no muscle/joint aches Skin: no rashes, no hyperemia Neurological: no tremors, no numbness, no tingling, no  dizziness Psychiatric: no depression, no anxiety    Objective:    BP 116/78 (BP Location: Left Arm, Patient Position: Sitting, Cuff Size: Large)   Pulse 67   Ht 6\' 2"  (1.88 m)   Wt 214 lb 9.6 oz (97.3 kg)   BMI 27.55 kg/m   Wt Readings from Last 3 Encounters:  10/28/23 214 lb 9.6 oz (97.3 kg)  09/21/23 212 lb (96.2 kg)  07/22/23 217 lb 9.6 oz (98.7 kg)    BP Readings from Last 3 Encounters:  10/28/23 116/78  09/21/23 108/60  07/22/23 136/76     Physical Exam- Limited  Constitutional:  Body mass index is 27.55 kg/m. , not in acute distress, normal state of mind Eyes:  EOMI, no exophthalmos Musculoskeletal: no gross deformities, strength intact in all four extremities, no gross restriction of joint movements Skin:  no rashes, no hyperemia Neurological: no tremor with outstretched hands   Diabetic Foot Exam - Simple   No data filed     CMP     Component Value Date/Time   NA 140 09/28/2023 0952   NA 143 10/13/2021 0935   K 4.2 09/28/2023 0952   CL 104 09/28/2023 0952   CO2 28 09/28/2023 0952   GLUCOSE 172 (H) 09/28/2023 0952   BUN 17 09/28/2023 0952    BUN 16 10/13/2021 0935   CREATININE 1.35 (H) 09/28/2023 0952   CALCIUM 9.7 09/28/2023 0952   PROT 6.6 09/28/2023 0952   PROT 6.1 10/13/2021 0935   ALBUMIN 4.0 10/13/2021 0935   AST 19 09/28/2023 0952   ALT 19 09/28/2023 0952   ALKPHOS 99 10/13/2021 0935   BILITOT 1.0 09/28/2023 0952   BILITOT 0.8 10/13/2021 0935   GFRNONAA 60 (L) 04/12/2023 1033   GFRNONAA 52 (L) 01/23/2020 1002   GFRAA 47 (L) 07/24/2020 0932   GFRAA 60 01/23/2020 1002     Diabetic Labs (most recent): Lab Results  Component Value Date   HGBA1C 7.3 (A) 10/28/2023   HGBA1C 7.8 (A) 06/28/2023   HGBA1C 8.1 (A) 02/22/2023   MICROALBUR 80 02/16/2022   MICROALBUR 80 12/16/2020   MICROALBUR 7.3 09/09/2017     Lipid Panel ( most recent) Lipid Panel     Component Value Date/Time   CHOL 141 09/28/2023 0952   CHOL 161 11/20/2020 1017   TRIG 243 (H) 09/28/2023 0952   HDL 45 09/28/2023 0952   HDL 43 11/20/2020 1017   CHOLHDL 3.1 09/28/2023 0952   VLDL 21 07/02/2016 0853   LDLCALC 66 09/28/2023 0952     Assessment & Plan:   1) Type 2 diabetes mellitus with other circulatory complications , and acute on chronic renal insufficiency   His diabetes is complicated by coronary artery disease status post coronary artery bypass graft, acute on chronic renal insufficiency and patient remains at a high risk for more acute and chronic complications of diabetes which include CAD, CVA, CKD, retinopathy, and neuropathy. These are all discussed in detail with the patient.  He presents today with no logs or meter to review.  His POCT A1c today is 7.3%, improving from last visit of 7.8%.  He has been doing better with his diet and increasing his physical exercise.  - Recent labs reviewed.   - Nutritional counseling repeated at each appointment due to patients tendency to fall back in to old habits.  - The patient admits there is a room for improvement in their diet and drink choices. -  Suggestion is made for the patient to  avoid simple carbohydrates from their diet including Cakes, Sweet Desserts / Pastries, Ice Cream, Soda (diet and regular), Sweet Tea, Candies, Chips, Cookies, Sweet Pastries, Store Bought Juices, Alcohol in Excess of 1-2 drinks a day, Artificial Sweeteners, Coffee Creamer, and "Sugar-free" Products. This will help patient to have stable blood glucose profile and potentially avoid unintended weight gain.   - I encouraged the patient to switch to unprocessed or minimally processed complex starch and increased protein intake (animal or plant source), fruits, and vegetables.   - Patient is advised to stick to a routine mealtimes to eat 3 meals a day and avoid unnecessary snacks (to snack only to correct hypoglycemia).  - I have approached patient with the following individualized plan to manage diabetes and patient agrees.  -Given his stable glycemic profile, no changes will be made to his medications today.  He is advised to continue his Metformin 500 mg po daily with breakfast and Jardiance 10 mg po daily.    -I did encourage him to continue monitoring glucose once daily 1-2 times per week to help keep him on track.   2) BP/HTN:  His blood pressure is controlled to target.  He is advised to continue Lisinopril 5 mg po daily and Metoprolol 12.5 mg po BID.  3) Lipids/HPL:  His most recent lipid panel from 09/28/23 shows controlled LDL of 66 and elevated triglycerides of 243.  He is advised to continue Lovastatin 20 mg po daily at bedtime and fish oil supplement twice daily.  Encouraged him to avoid fried foods and butter.    4) Weight management:  His Body mass index is 27.55 kg/m.-a candidate for modest weight loss.  Detailed carbohydrates and exercise regimen was discussed with him.  5) Chronic Care/Health Maintenance: -Patient is on ACE and Statin medications and encouraged to continue to follow up with Ophthalmology, Podiatrist at least yearly or according to recommendations, and advised to   stay away from smoking. I have recommended yearly flu vaccine and pneumonia vaccination at least every 5 years; moderate intensity exercise for up to 150 minutes weekly; and  sleep for at least 7 hours a day.   - I advised patient to maintain close follow up with Burdine, Steven E, MD for primary care needs.     I spent  38  minutes in the care of the patient today including review of labs from CMP, Lipids, Thyroid Function, Hematology (current and previous including abstractions from other facilities); face-to-face time discussing  his blood glucose readings/logs, discussing hypoglycemia and hyperglycemia episodes and symptoms, medications doses, his options of short and long term treatment based on the latest standards of care / guidelines;  discussion about incorporating lifestyle medicine;  and documenting the encounter. Risk reduction counseling performed per USPSTF guidelines to reduce obesity and cardiovascular risk factors.     Please refer to Patient Instructions for Blood Glucose Monitoring and Insulin/Medications Dosing Guide"  in media tab for additional information. Please  also refer to " Patient Self Inventory" in the Media  tab for reviewed elements of pertinent patient history.  [name] [name] participated in the discussions, expressed understanding, and voiced agreement with the above plans.  All questions were answered to his satisfaction. he is encouraged to contact clinic should he have any questions or concerns prior to his return visit.   Follow up plan: -Return in about 4 months (around 02/27/2024) for Diabetes F/U with A1c in office, No previsit labs.    Hulon Magic, FNP-BC [facility]  [address] [place], [place] [address] Phone: [phone] Fax: [phone]  10/28/2023, 8:55 AM

## 2023-11-01 ENCOUNTER — Encounter (HOSPITAL_BASED_OUTPATIENT_CLINIC_OR_DEPARTMENT_OTHER): Payer: Self-pay | Admitting: General Surgery

## 2023-11-02 ENCOUNTER — Other Ambulatory Visit: Payer: Self-pay | Admitting: Nurse Practitioner

## 2023-11-02 DIAGNOSIS — I5022 Chronic systolic (congestive) heart failure: Secondary | ICD-10-CM

## 2023-11-02 DIAGNOSIS — E782 Mixed hyperlipidemia: Secondary | ICD-10-CM

## 2023-11-02 MED ORDER — SIMVASTATIN 20 MG PO TABS: 20.0000 mg | ORAL_TABLET | Freq: Every day | ORAL | 1 refills | Status: DC

## 2023-11-03 ENCOUNTER — Encounter (HOSPITAL_BASED_OUTPATIENT_CLINIC_OR_DEPARTMENT_OTHER)
Admission: RE | Admit: 2023-11-03 | Discharge: 2023-11-03 | Disposition: A | Discharge status: Home / Self Care | Source: Ambulatory Visit | Attending: General Surgery | Admitting: General Surgery

## 2023-11-03 DIAGNOSIS — Z01812 Encounter for preprocedural laboratory examination: Secondary | ICD-10-CM | POA: Insufficient documentation

## 2023-11-03 LAB — BASIC METABOLIC PANEL WITH GFR
Anion gap: 12 (ref 5–15)
BUN: 19 mg/dL (ref 8–23)
CO2: 25 mmol/L (ref 22–32)
Calcium: 9.7 mg/dL (ref 8.9–10.3)
Chloride: 99 mmol/L (ref 98–111)
Creatinine, Ser: 1.43 mg/dL — ABNORMAL HIGH (ref 0.61–1.24)
GFR, Estimated: 53 mL/min — ABNORMAL LOW (ref >60)
Glucose, Bld: 266 mg/dL — ABNORMAL HIGH (ref 70–99)
Potassium: 4.9 mmol/L (ref 3.5–5.1)
Sodium: 136 mmol/L (ref 135–145)

## 2023-11-03 NOTE — Progress Notes (Deleted)

## 2023-11-04 DIAGNOSIS — E119 Type 2 diabetes mellitus without complications: Secondary | ICD-10-CM | POA: Diagnosis not present

## 2023-11-04 DIAGNOSIS — E1140 Type 2 diabetes mellitus with diabetic neuropathy, unspecified: Secondary | ICD-10-CM | POA: Diagnosis not present

## 2023-11-04 DIAGNOSIS — Z13220 Encounter for screening for lipoid disorders: Secondary | ICD-10-CM | POA: Diagnosis not present

## 2023-11-04 DIAGNOSIS — R5383 Other fatigue: Secondary | ICD-10-CM | POA: Diagnosis not present

## 2023-11-04 DIAGNOSIS — N200 Calculus of kidney: Secondary | ICD-10-CM | POA: Diagnosis not present

## 2023-11-05 ENCOUNTER — Ambulatory Visit (HOSPITAL_BASED_OUTPATIENT_CLINIC_OR_DEPARTMENT_OTHER)
Admission: RE | Admit: 2023-11-05 | Discharge: 2023-11-05 | Disposition: A | Discharge status: Home / Self Care | Attending: General Surgery | Admitting: General Surgery

## 2023-11-05 ENCOUNTER — Ambulatory Visit (HOSPITAL_BASED_OUTPATIENT_CLINIC_OR_DEPARTMENT_OTHER): Payer: Self-pay | Admitting: Anesthesiology

## 2023-11-05 ENCOUNTER — Other Ambulatory Visit: Payer: Self-pay

## 2023-11-05 ENCOUNTER — Encounter (HOSPITAL_BASED_OUTPATIENT_CLINIC_OR_DEPARTMENT_OTHER): Payer: Self-pay | Admitting: General Surgery

## 2023-11-05 ENCOUNTER — Encounter (HOSPITAL_BASED_OUTPATIENT_CLINIC_OR_DEPARTMENT_OTHER)
Admission: RE | Disposition: A | Discharge status: Home / Self Care | Payer: Self-pay | Source: Home / Self Care | Attending: General Surgery

## 2023-11-05 DIAGNOSIS — K60319 Anal fistula, simple, unspecified: Secondary | ICD-10-CM | POA: Diagnosis not present

## 2023-11-05 DIAGNOSIS — I10 Essential (primary) hypertension: Secondary | ICD-10-CM | POA: Diagnosis not present

## 2023-11-05 DIAGNOSIS — I130 Hypertensive heart and chronic kidney disease with heart failure and stage 1 through stage 4 chronic kidney disease, or unspecified chronic kidney disease: Secondary | ICD-10-CM | POA: Insufficient documentation

## 2023-11-05 DIAGNOSIS — E7800 Pure hypercholesterolemia, unspecified: Secondary | ICD-10-CM

## 2023-11-05 DIAGNOSIS — N189 Chronic kidney disease, unspecified: Secondary | ICD-10-CM | POA: Insufficient documentation

## 2023-11-05 DIAGNOSIS — G4733 Obstructive sleep apnea (adult) (pediatric): Secondary | ICD-10-CM | POA: Insufficient documentation

## 2023-11-05 DIAGNOSIS — K603 Anal fistula, unspecified: Secondary | ICD-10-CM | POA: Diagnosis not present

## 2023-11-05 DIAGNOSIS — Z7984 Long term (current) use of oral hypoglycemic drugs: Secondary | ICD-10-CM | POA: Diagnosis not present

## 2023-11-05 DIAGNOSIS — Z01818 Encounter for other preprocedural examination: Secondary | ICD-10-CM

## 2023-11-05 DIAGNOSIS — E1122 Type 2 diabetes mellitus with diabetic chronic kidney disease: Secondary | ICD-10-CM | POA: Diagnosis not present

## 2023-11-05 DIAGNOSIS — Z79899 Other long term (current) drug therapy: Secondary | ICD-10-CM | POA: Diagnosis not present

## 2023-11-05 DIAGNOSIS — I2510 Atherosclerotic heart disease of native coronary artery without angina pectoris: Secondary | ICD-10-CM | POA: Diagnosis not present

## 2023-11-05 DIAGNOSIS — K60311 Anal fistula, simple, initial: Secondary | ICD-10-CM | POA: Diagnosis not present

## 2023-11-05 DIAGNOSIS — I509 Heart failure, unspecified: Secondary | ICD-10-CM | POA: Insufficient documentation

## 2023-11-05 DIAGNOSIS — Z833 Family history of diabetes mellitus: Secondary | ICD-10-CM | POA: Diagnosis not present

## 2023-11-05 HISTORY — PX: RECTAL EXAM UNDER ANESTHESIA: SHX6399

## 2023-11-05 HISTORY — PX: ANAL FISTULOTOMY: SHX6423

## 2023-11-05 LAB — GLUCOSE, CAPILLARY
Glucose-Capillary: 179 mg/dL — ABNORMAL HIGH (ref 70–99)
Glucose-Capillary: 172 mg/dL — ABNORMAL HIGH (ref 70–99)

## 2023-11-05 SURGERY — ANAL FISTULOTOMY
Anesthesia: Monitor Anesthesia Care

## 2023-11-05 MED ORDER — OXYCODONE HCL 5 MG/5ML PO SOLN: 5.0000 mg | Freq: Once | ORAL | Status: DC | PRN

## 2023-11-05 MED ORDER — FENTANYL CITRATE (PF) 100 MCG/2ML IJ SOLN
25.0000 ug | INTRAMUSCULAR | Status: DC | PRN
Start: 2023-11-05 — End: 2023-11-05

## 2023-11-05 MED ORDER — KETAMINE HCL 50 MG/5ML IJ SOSY
PREFILLED_SYRINGE | INTRAMUSCULAR | Status: AC
  Filled 2023-11-05: qty 5

## 2023-11-05 MED ORDER — OXYCODONE HCL 5 MG PO TABS: 5.0000 mg | ORAL_TABLET | Freq: Once | ORAL | Status: DC | PRN

## 2023-11-05 MED ORDER — ACETAMINOPHEN 500 MG PO TABS
1000.0000 mg | ORAL_TABLET | ORAL | Status: AC
  Administered 2023-11-05: 1000 mg via ORAL

## 2023-11-05 MED ORDER — 0.9 % SODIUM CHLORIDE (POUR BTL) OPTIME
TOPICAL | Status: DC | PRN
  Administered 2023-11-05: 1000 mL

## 2023-11-05 MED ORDER — SODIUM CHLORIDE 0.9% FLUSH: 3.0000 mL | Freq: Two times a day (BID) | INTRAVENOUS | Status: DC

## 2023-11-05 MED ORDER — PROPOFOL 10 MG/ML IV BOLUS
INTRAVENOUS | Status: DC | PRN
  Administered 2023-11-05: 30 mg via INTRAVENOUS

## 2023-11-05 MED ORDER — FENTANYL CITRATE (PF) 100 MCG/2ML IJ SOLN
INTRAMUSCULAR | Status: DC | PRN
  Administered 2023-11-05: 25 ug via INTRAVENOUS

## 2023-11-05 MED ORDER — DROPERIDOL 2.5 MG/ML IJ SOLN: 0.6250 mg | Freq: Once | INTRAMUSCULAR | Status: DC | PRN

## 2023-11-05 MED ORDER — ACETAMINOPHEN 500 MG PO TABS
ORAL_TABLET | ORAL | Status: AC
  Filled 2023-11-05: qty 2

## 2023-11-05 MED ORDER — FENTANYL CITRATE (PF) 100 MCG/2ML IJ SOLN
INTRAMUSCULAR | Status: AC
  Filled 2023-11-05: qty 2

## 2023-11-05 MED ORDER — KETAMINE HCL 10 MG/ML IJ SOLN
INTRAMUSCULAR | Status: DC | PRN
  Administered 2023-11-05: 50 mg via INTRAVENOUS

## 2023-11-05 MED ORDER — TRAMADOL HCL 50 MG PO TABS: 50.0000 mg | ORAL_TABLET | Freq: Four times a day (QID) | ORAL | 0 refills | Status: AC | PRN

## 2023-11-05 MED ORDER — PROPOFOL 500 MG/50ML IV EMUL
INTRAVENOUS | Status: DC | PRN
Start: 2023-11-05 — End: 2023-11-05
  Administered 2023-11-05: 200 ug/kg/min via INTRAVENOUS

## 2023-11-05 MED ORDER — LACTATED RINGERS IV SOLN: INTRAVENOUS | Status: DC

## 2023-11-05 MED ORDER — PROPOFOL 500 MG/50ML IV EMUL
INTRAVENOUS | Status: AC
  Filled 2023-11-05: qty 50

## 2023-11-05 MED ORDER — BUPIVACAINE-EPINEPHRINE 0.5% -1:200000 IJ SOLN
INTRAMUSCULAR | Status: DC | PRN
  Administered 2023-11-05: 30 mL

## 2023-11-05 MED ORDER — ACETAMINOPHEN 10 MG/ML IV SOLN: 1000.0000 mg | Freq: Once | INTRAVENOUS | Status: DC | PRN

## 2023-11-05 SURGICAL SUPPLY — 47 items
BENZOIN TINCTURE PRP APPL 2/3 (GAUZE/BANDAGES/DRESSINGS) ×2 IMPLANT
BLADE EXTENDED COATED 6.5IN (ELECTRODE) IMPLANT
BLADE SURG 10 STRL SS (BLADE) IMPLANT
BRIEF MESH DISP 2XL (UNDERPADS AND DIAPERS) ×1 IMPLANT
COVER BACK TABLE 60X90IN (DRAPES) ×1 IMPLANT
COVER MAYO STAND STRL (DRAPES) ×1 IMPLANT
DRAPE HYSTEROSCOPY (MISCELLANEOUS) IMPLANT
DRAPE LAPAROTOMY 100X72 PEDS (DRAPES) ×1 IMPLANT
DRAPE UTILITY XL STRL (DRAPES) ×1 IMPLANT
ELECTRODE REM PT RTRN 9FT ADLT (ELECTROSURGICAL) ×1 IMPLANT
GAUZE 4X4 16PLY ~~LOC~~+RFID DBL (SPONGE) ×1 IMPLANT
GAUZE PAD ABD 8X10 STRL (GAUZE/BANDAGES/DRESSINGS) ×1 IMPLANT
GAUZE SPONGE 4X4 12PLY STRL (GAUZE/BANDAGES/DRESSINGS) IMPLANT
GLOVE BIO SURGEON STRL SZ 6.5 (GLOVE) ×1 IMPLANT
GLOVE INDICATOR 6.5 STRL GRN (GLOVE) ×1 IMPLANT
GOWN STRL REUS W/TWL XL LVL3 (GOWN DISPOSABLE) ×1 IMPLANT
HYDROGEN PEROXIDE 16OZ (MISCELLANEOUS) IMPLANT
IV CATH 14GX2 1/4 (CATHETERS) IMPLANT
IV CATH 18G SAFETY (IV SOLUTION) IMPLANT
KIT SIGMOIDOSCOPE (SET/KITS/TRAYS/PACK) IMPLANT
KIT TURNOVER KIT B (KITS) ×1 IMPLANT
LEGGING LITHOTOMY PAIR STRL (DRAPES) IMPLANT
LOOP VASCLR MAXI BLUE 18IN ST (MISCELLANEOUS) IMPLANT
LOOPS VASCLR MAXI BLUE 18IN ST (MISCELLANEOUS) IMPLANT
NDL HYPO 22X1.5 SAFETY MO (MISCELLANEOUS) ×1 IMPLANT
NEEDLE HYPO 22X1.5 SAFETY MO (MISCELLANEOUS) ×1 IMPLANT
NS IRRIG 1000ML POUR BTL (IV SOLUTION) ×1 IMPLANT
PACK BASIN DAY SURGERY FS (CUSTOM PROCEDURE TRAY) ×1 IMPLANT
PAD ARMBOARD POSITIONER FOAM (MISCELLANEOUS) IMPLANT
PENCIL SMOKE EVACUATOR (MISCELLANEOUS) ×1 IMPLANT
SHEET MEDIUM DRAPE 40X70 STRL (DRAPES) IMPLANT
SLEEVE SCD COMPRESS KNEE MED (STOCKING) ×1 IMPLANT
SPIKE FLUID TRANSFER (MISCELLANEOUS) ×1 IMPLANT
SPONGE HEMORRHOID 8X3CM (HEMOSTASIS) IMPLANT
SPONGE SURGIFOAM ABS GEL 12-7 (HEMOSTASIS) IMPLANT
SUCTION TUBE FRAZIER 10FR DISP (SUCTIONS) IMPLANT
SUT CHROMIC 2 0 SH (SUTURE) IMPLANT
SUT CHROMIC 3 0 SH 27 (SUTURE) IMPLANT
SUT ETHIBOND 0 (SUTURE) IMPLANT
SUT VIC AB 2-0 SH 27XBRD (SUTURE) IMPLANT
SUT VIC AB 3-0 SH 18 (SUTURE) IMPLANT
SUT VIC AB 3-0 SH 27XBRD (SUTURE) IMPLANT
SYR CONTROL 10ML LL (SYRINGE) ×1 IMPLANT
TOWEL GREEN STERILE FF (TOWEL DISPOSABLE) ×1 IMPLANT
TRAY DSU PREP LF (CUSTOM PROCEDURE TRAY) ×1 IMPLANT
TUBE CONNECTING 20X1/4 (TUBING) ×1 IMPLANT
YANKAUER SUCT BULB TIP NO VENT (SUCTIONS) ×1 IMPLANT

## 2023-11-05 NOTE — Op Note (Signed)
 11/05/2023  11:32 AM  PATIENT:  [name] [name]  73 y.o. male  Patient Care Team: Burdine, Maceo Sax, MD as PCP - General (Family Medicine) Gerard Knight, MD as PCP - Cardiology (Cardiology) Arnoldo Lapping, MD as Consulting Physician (Cardiology) Baby Bolt, MD as Consulting Physician (Endocrinology)  PRE-OPERATIVE DIAGNOSIS:  ANAL FISTULA  POST-OPERATIVE DIAGNOSIS:  ANAL FISTULA  PROCEDURE:  ANAL FISTULOTOMY INTERROGATION OF ANAL FISTULA   Surgeon(s): Joyce Nixon, MD  ASSISTANT: none   ANESTHESIA:   local and MAC  SPECIMEN:  No Specimen  DISPOSITION OF SPECIMEN:  N/A  COUNTS:  YES  PLAN OF CARE: Discharge to home after PACU  PATIENT DISPOSITION:  PACU - hemodynamically stable.  INDICATION: 73 y.o. M with anal fistula that appears intersphincteric on MRI   OR FINDINGS: intersphincteric anal fistula with posterior midline cavity behind external anal sphincter.  DESCRIPTION: the patient was identified in the preoperative holding area and taken to the OR where they were laid on the operating room table.  MAC anesthesia was induced without difficulty. The patient was then positioned in prone jackknife position with buttocks gently taped apart.  The patient was then prepped and draped in usual sterile fashion.  SCDs were noted to be in place prior to the initiation of anesthesia. A surgical timeout was performed indicating the correct patient, procedure, positioning and need for preoperative antibiotics.  A rectal block was performed using Marcaine  with epinephrine .    I began with a digital rectal exam.  The anal canal was gently dilated.  I then placed an anoscope into the anal canal and evaluated this completely.  I inserted the fistula probe into the external opening, which was proximal to external sphincter.  This exited ~1cm proximally.  There was a cavity posterior to the external opening (~1x1cm).  This was debrided.  The fistula tract was divided  with cautery.  The edges were marsupialized with a running 2-0 Chromic suture.  Hemostasis was good.  A dressing was applied and the patient was awakened from anesthesia and sent to the PACU in stable condition.  All counts were correct per OR staff.   Fernande Howells, MD  Colorectal and General Surgery [facility]

## 2023-11-05 NOTE — Transfer of Care (Signed)
 Immediate Anesthesia Transfer of Care Note  Patient: [name] [name]  Procedure(s) Performed: Procedure(s) (LRB): ANAL FISTULOTOMY (N/A) EXAM UNDER ANESTHESIA, RECTUM (N/A)  Patient Location: PACU  Anesthesia Type: MAC  Level of Consciousness: awake, alert , oriented and patient cooperative  Airway & Oxygen  Therapy: Patient Spontanous Breathing Room Air  Post-op Assessment: Report given to PACU RN and Post -op Vital signs reviewed and stable  Post vital signs: Reviewed and stable  Complications: No apparent anesthesia complications Last Vitals:  Vitals Value Taken Time  BP    Temp    Pulse 60 11/05/23 1142  Resp 15 11/05/23 1142  SpO2 93 % 11/05/23 1142  Vitals shown include unfiled device data.  Last Pain:  Vitals:   11/05/23 0931  TempSrc: Temporal  PainSc: 0-No pain         Complications: No notable events documented.

## 2023-11-05 NOTE — Anesthesia Preprocedure Evaluation (Addendum)
 Anesthesia Evaluation  Patient identified by MRN, date of birth, ID band Patient awake    Reviewed: Allergy & Precautions, NPO status , Patient's Chart, lab work & pertinent test results  Airway Mallampati: III  TM Distance: >3 FB Neck ROM: Full    Dental  (+) Implants   Pulmonary sleep apnea    Pulmonary exam normal        Cardiovascular hypertension, + CAD, + Past MI and + CABG  Normal cardiovascular exam  Echo:   1. Left ventricular ejection fraction, by estimation, is 45 to 50%. The  left ventricle has mildly decreased function. The left ventricle has no  regional wall motion abnormalities. Left ventricular diastolic parameters  are indeterminate.   2. Right ventricular systolic function is mildly reduced. The right  ventricular size is normal.   3. Left atrial size was mildly dilated.   4. The mitral valve is normal in structure. Trivial mitral valve  regurgitation. No evidence of mitral stenosis.   5. The aortic valve is tricuspid. There is mild calcification of the  aortic valve. Aortic valve regurgitation is not visualized. No aortic  stenosis is present.     Neuro/Psych   Anxiety     negative neurological ROS     GI/Hepatic Neg liver ROS,GERD  ,,  Endo/Other  diabetes    Renal/GU Renal disease  negative genitourinary   Musculoskeletal  (+) Arthritis ,    Abdominal   Peds  Hematology negative hematology ROS (+)   Anesthesia Other Findings   Reproductive/Obstetrics                             Anesthesia Physical Anesthesia Plan  ASA: 3  Anesthesia Plan: MAC   Post-op Pain Management: Toradol  IV (intra-op)*   Induction: Intravenous  PONV Risk Score and Plan: 2 and Ondansetron , Propofol  infusion and Midazolam   Airway Management Planned: Natural Airway and Nasal Cannula  Additional Equipment: None  Intra-op Plan:   Post-operative Plan:   Informed Consent: I have  reviewed the patients History and Physical, chart, labs and discussed the procedure including the risks, benefits and alternatives for the proposed anesthesia with the patient or authorized representative who has indicated his/her understanding and acceptance.       Plan Discussed with: CRNA  Anesthesia Plan Comments:        Anesthesia Quick Evaluation

## 2023-11-05 NOTE — Anesthesia Procedure Notes (Signed)
 Procedure Name: MAC Date/Time: 11/05/2023 11:06 AM  Performed by: Glo Larch, CRNAPre-anesthesia Checklist: Patient identified, Emergency Drugs available, Suction available and Patient being monitored Oxygen  Delivery Method: Simple face mask

## 2023-11-05 NOTE — Discharge Instructions (Addendum)
 ANORECTAL SURGERY: POST OP INSTRUCTIONS Take your usually prescribed home medications unless otherwise directed. DIET: During the first few hours after surgery sip on some liquids until you are able to urinate.  It is normal to not urinate for several hours after this surgery.  If you feel uncomfortable, please contact the office for instructions.  After you are able to urinate,you may eat, if you feel like it.  Follow a light bland diet the first 24 hours after arrival home, such as soup, liquids, crackers, etc.  Be sure to include lots of fluids daily (6-8 glasses).  Avoid fast food or heavy meals, as your are more likely to get nauseated.  Eat a low fat diet the next few days after surgery.  Limit caffeine intake to 1-2 servings a day. PAIN CONTROL: Pain is best controlled by a usual combination of several different methods TOGETHER: Muscle relaxation: Soak in a warm bath (or Sitz bath) three times a day and after bowel movements.  Continue to do this until all pain is resolved. Over the counter pain medication Prescription pain medication Most patients will experience some swelling and discomfort in the anus/rectal area and incisions.  Heat such as warm towels, sitz baths, warm baths, etc to help relax tight/sore spots and speed recovery.  Some people prefer to use ice, especially in the first couple days after surgery, as it may decrease the pain and swelling, or alternate between ice & heat.  Experiment to what works for you.  Swelling and bruising can take several weeks to resolve.  Pain can take even longer to completely resolve. It is helpful to take an over-the-counter pain medication regularly for the first few weeks.  Choose one of the following that works best for you: Naproxen (Aleve, etc)  Two 220mg  tabs twice a day Ibuprofen  (Advil , etc) Three 200mg  tabs four times a day (every meal & bedtime) A  prescription for pain medication (such as percocet, oxycodone , hydrocodone, etc) should be  given to you upon discharge.  Take your pain medication as prescribed.  If you are having problems/concerns with the prescription medicine (does not control pain, nausea, vomiting, rash, itching, etc), please call us  [phone] to see if we need to switch you to a different pain medicine that will work better for you and/or control your side effect better. If you need a refill on your pain medication, please contact your pharmacy.  They will contact our office to request authorization. Prescriptions will not be filled after 5 pm or on week-ends. KEEP YOUR BOWELS REGULAR and AVOID CONSTIPATION The goal is one to two soft bowel movements a day.  You should at least have a bowel movement every other day. Avoid getting constipated.  Between the surgery and the pain medications, it is common to experience some constipation. This can be very painful after rectal surgery.  Increasing fluid intake and taking a fiber supplement (such as Metamucil, Citrucel, FiberCon, etc) 1-2 times a day regularly will usually help prevent this problem from occurring.  A stool softener like colace is also recommended.  This can be purchased over the counter at your pharmacy.  You can take it up to 3 times a day.  If you do not have a bowel movement after 24 hrs since your surgery, take one does of milk of magnesia.  If you still haven't had a bowel movement 8-12 hours after that dose, take another dose.  If you don't have a bowel movement 48 hrs after surgery,  purchase a Fleets enema from the drug store and administer gently per package instructions.  If you still are having trouble with your bowel movements after that, please call the office for further instructions. If you develop diarrhea or have many loose bowel movements, simplify your diet to bland foods & liquids for a few days.  Stop any stool softeners and decrease your fiber supplement.  Switching to mild anti-diarrheal medications (Kayopectate, Pepto Bismol) can help.   If this worsens or does not improve, please call us .  Wound Care Remove your bandages before your first bowel movement or 8 hours after surgery.     Remove any wound packing material at this tim,e as well.  You do not need to repack the wound unless instructed otherwise.  Wear an absorbent pad or soft cotton gauze in your underwear to catch any drainage and help keep the area clean. You should change this every 2-3 hours while awake. Keep the area clean and dry.  Bathe / shower every day, especially after bowel movements.  Keep the area clean by showering / bathing over the incision / wound.   It is okay to soak an open wound to help wash it.  Wet wipes or showers / gentle washing after bowel movements is often less traumatic than regular toilet paper. You may have some styrofoam-like soft packing in the rectum which will come out with the first bowel movement.  You will often notice bleeding with bowel movements.  This should slow down by the end of the first week of surgery Expect some drainage.  This should slow down, too, by the end of the first week of surgery.  Wear an absorbent pad or soft cotton gauze in your underwear until the drainage stops. Do Not sit on a rubber or pillow ring.  This can make you symptoms worse.  You may sit on a soft pillow if needed.  ACTIVITIES as tolerated:   You may resume regular (light) daily activities beginning the next day--such as daily self-care, walking, climbing stairs--gradually increasing activities as tolerated.  If you can walk 30 minutes without difficulty, it is safe to try more intense activity such as jogging, treadmill, bicycling, low-impact aerobics, swimming, etc. Save the most intensive and strenuous activity for last such as sit-ups, heavy lifting, contact sports, etc  Refrain from any heavy lifting or straining until you are off narcotics for pain control.   You may drive when you are no longer taking prescription pain medication, you can  comfortably sit for long periods of time, and you can safely maneuver your car and apply brakes. You may have sexual intercourse when it is comfortable.  FOLLOW UP in our office Please call [facility] at [phone] to set up an appointment to see your surgeon in the office for a follow-up appointment approximately 3-4 weeks after your surgery. Make sure that you call for this appointment the day you arrive home to insure a convenient appointment time. 10. IF YOU HAVE DISABILITY OR FAMILY LEAVE FORMS, BRING THEM TO THE OFFICE FOR PROCESSING.  DO NOT GIVE THEM TO YOUR DOCTOR.     WHEN TO CALL US  [phone]: Poor pain control Reactions / problems with new medications (rash/itching, nausea, etc)  Fever over 101.5 F (38.5 C) Inability to urinate Nausea and/or vomiting Worsening swelling or bruising Continued bleeding from incision. Increased pain, redness, or drainage from the incision  The clinic staff is available to answer your questions during regular business hours (8:30am-5pm).  Please don't hesitate to call and ask to speak to one of our nurses for clinical concerns.   A surgeon from [facility] is always on call at the hospitals   If you have a medical emergency, go to the nearest emergency room or call 911.    [facility] [address], [address], [place], [place]  [address] ? MAIN: [phone] ? TOLL FREE: [phone] ? FAX [phone] [email or web address]    No Tylenol  before 3:30pm   Post Anesthesia Home Care Instructions  Activity: Get plenty of rest for the remainder of the day. A responsible individual must stay with you for 24 hours following the procedure.  For the next 24 hours, DO NOT: -Drive a car -Advertising copywriter -Drink alcoholic beverages -Take any medication unless instructed by your physician -Make any legal decisions or sign important papers.  Meals: Start with liquid foods such as gelatin or  soup. Progress to regular foods as tolerated. Avoid greasy, spicy, heavy foods. If nausea and/or vomiting occur, drink only clear liquids until the nausea and/or vomiting subsides. Call your physician if vomiting continues.  Special Instructions/Symptoms: Your throat may feel dry or sore from the anesthesia or the breathing tube placed in your throat during surgery. If this causes discomfort, gargle with warm salt water. The discomfort should disappear within 24 hours.

## 2023-11-05 NOTE — Anesthesia Postprocedure Evaluation (Signed)
 Anesthesia Post Note  Patient: [name] [name]  Procedure(s) Performed: ANAL FISTULOTOMY EXAM UNDER ANESTHESIA, RECTUM     Patient location during evaluation: PACU Anesthesia Type: MAC Level of consciousness: awake and alert Pain management: pain level controlled Vital Signs Assessment: post-procedure vital signs reviewed and stable Respiratory status: spontaneous breathing, nonlabored ventilation, respiratory function stable and patient connected to nasal cannula oxygen  Cardiovascular status: stable and blood pressure returned to baseline Postop Assessment: no apparent nausea or vomiting Anesthetic complications: no  No notable events documented.  Last Vitals:  Vitals:   11/05/23 1209 11/05/23 1218  BP: 119/62 (!) 140/69  Pulse: 66 (!) 55  Resp: 13 16  Temp:  (!) 36.4 C  SpO2: 95% 98%    Last Pain:  Vitals:   11/05/23 1218  TempSrc:   PainSc: 0-No pain                 Willian Harrow

## 2023-11-05 NOTE — H&P (Signed)
 REFERRING PHYSICIAN:  Evander Hills   PROVIDER:  Denese Finn, MD   MRN: [id number] DOB: April 13, 1951 DATE OF ENCOUNTER: 09/13/2023   Subjective    Chief Complaint: New Consultation (ANAL FISTULA, )       History of Present Illness: [name] [name] is a 73 y.o. male who is seen today as an office consultation at the request of Dr. April Knack for evaluation of New Consultation (ANAL FISTULA, ) .  73 year old male who presents to the office for evaluation of bloody drainage noted perianally the past couple months.  He has been having regular bowel movements.  He was seen by his gastroenterologist and a small lesion was noted anteriorly.  A pelvic MRI was obtained.  This showed a left-sided intersphincteric anal fistula.     Review of Systems: A complete review of systems was obtained from the patient.  I have reviewed this information and discussed as appropriate with the patient.  See HPI as well for other ROS.     Medical History:     Past Medical History:  Diagnosis Date   Anxiety     CHF (congestive heart failure) (CMS/HHS-HCC)     Chronic kidney disease     Diabetes mellitus without complication (CMS/HHS-HCC)     GERD (gastroesophageal reflux disease)     Hypertension     Sleep apnea           Patient Active Problem List  Diagnosis   OSA on CPAP   S/P CABG x 4   Type 2 diabetes mellitus (CMS/HHS-HCC)      History reviewed. No pertinent surgical history.    No Known Allergies         Current Outpatient Medications on File Prior to Visit  Medication Sig Dispense Refill   allopurinoL (ZYLOPRIM) 300 MG tablet Take 300 mg by mouth once daily       ezetimibe  (ZETIA ) 10 mg tablet Take 10 mg by mouth once daily       JARDIANCE  10 mg tablet Take 10 mg by mouth every morning before breakfast (0630)       lisinopriL  (ZESTRIL ) 5 MG tablet Take 5 mg by mouth once daily       metFORMIN  (GLUCOPHAGE ) 500 MG tablet Take 500 mg by mouth once daily       metoprolol   TARTrate (LOPRESSOR ) 25 MG tablet Take 12.5 mg by mouth 2 (two) times daily       omeprazole (PRILOSEC) 20 MG DR capsule Take 20 mg by mouth once daily       simvastatin  (ZOCOR ) 10 MG tablet Take 10 mg by mouth at bedtime        No current facility-administered medications on file prior to visit.           Family History  Problem Relation Age of Onset   High blood pressure (Hypertension) Mother     Hyperlipidemia (Elevated cholesterol) Mother     Coronary Artery Disease (Blocked arteries around heart) Mother     Hyperlipidemia (Elevated cholesterol) Father     Diabetes Father     Deep vein thrombosis (DVT or abnormal blood clot formation) Father        Social History       Tobacco Use  Smoking Status Never  Smokeless Tobacco Never      Social History        Socioeconomic History   Marital status: Married  Tobacco Use   Smoking status: Never   Smokeless tobacco: Never  Vaping Use   Vaping status: Never Used  Substance and Sexual Activity   Alcohol  use: Not Currently   Drug use: Never    Social Drivers of Health        Financial Resource Strain: Low Risk  (05/11/2022)    Received from [facility]    Overall Financial Resource Strain (CARDIA)     Difficulty of Paying Living Expenses: Not hard at all  Food Insecurity: No Food Insecurity (03/05/2022)    Received from [facility]    Hunger Vital Sign     Worried About Running Out of Food in the Last Year: Never true     Ran Out of Food in the Last Year: Never true  Transportation Needs: No Transportation Needs (05/11/2022)    Received from [facility] - Transportation     Lack of Transportation (Medical): No     Lack of Transportation (Non-Medical): No  Housing Stability: Unknown (09/13/2023)    Housing Stability Vital Sign     Homeless in the Last Year: No      Objective:      Vitals:   11/05/23 0931  BP: (!) 154/75  Pulse: (!) 58  Resp: 16  Temp: (!) 97.2 F (36.2 C)  SpO2: 99%       Exam Gen: NAD CV: RRR Pulm: CTA Abd: soft Rectal: possible L lateral palpable cord, anterior anal external opening      Labs, Imaging and Diagnostic Testing:     Assessment and Plan:  Diagnoses and all orders for this visit:   Anal fistula       73 year old male with anal fistula noted on MRI.  I have recommended an exam under anesthesia with interrogation of anal fistula and probable fistulotomy.  We have discussed this in detail including risk and postoperative pain.  Risk of recurrence is low.  We did discuss that there is a small risk of incontinence but looking at the size of the fistula I do not think this will be a major issue for him.   Fernande Howells, MD Colon and Rectal Surgery [facility]

## 2023-11-06 ENCOUNTER — Encounter (HOSPITAL_BASED_OUTPATIENT_CLINIC_OR_DEPARTMENT_OTHER): Payer: Self-pay | Admitting: General Surgery

## 2023-11-17 DIAGNOSIS — I25119 Atherosclerotic heart disease of native coronary artery with unspecified angina pectoris: Secondary | ICD-10-CM | POA: Diagnosis not present

## 2023-11-17 DIAGNOSIS — E1140 Type 2 diabetes mellitus with diabetic neuropathy, unspecified: Secondary | ICD-10-CM | POA: Diagnosis not present

## 2023-11-17 DIAGNOSIS — E1169 Type 2 diabetes mellitus with other specified complication: Secondary | ICD-10-CM | POA: Diagnosis not present

## 2023-11-17 DIAGNOSIS — Z6828 Body mass index (BMI) 28.0-28.9, adult: Secondary | ICD-10-CM | POA: Diagnosis not present

## 2023-11-17 DIAGNOSIS — Z951 Presence of aortocoronary bypass graft: Secondary | ICD-10-CM | POA: Diagnosis not present

## 2023-11-17 DIAGNOSIS — Z2911 Encounter for prophylactic immunotherapy for respiratory syncytial virus (RSV): Secondary | ICD-10-CM | POA: Diagnosis not present

## 2023-11-17 DIAGNOSIS — G4733 Obstructive sleep apnea (adult) (pediatric): Secondary | ICD-10-CM | POA: Diagnosis not present

## 2023-12-07 DIAGNOSIS — I5022 Chronic systolic (congestive) heart failure: Secondary | ICD-10-CM | POA: Diagnosis not present

## 2023-12-07 DIAGNOSIS — E782 Mixed hyperlipidemia: Secondary | ICD-10-CM | POA: Diagnosis not present

## 2023-12-07 LAB — HEPATIC FUNCTION PANEL
AG Ratio: 2 (calc) (ref 1.0–2.5)
ALT: 20 U/L (ref 9–46)
AST: 23 U/L (ref 10–35)
Albumin: 4.1 g/dL (ref 3.6–5.1)
Alkaline phosphatase (APISO): 69 U/L (ref 35–144)
Bilirubin, Direct: 0.3 mg/dL — ABNORMAL HIGH (ref 0.0–0.2)
Globulin: 2.1 g/dL (ref 1.9–3.7)
Indirect Bilirubin: 1.1 mg/dL (ref 0.2–1.2)
Total Bilirubin: 1.4 mg/dL — ABNORMAL HIGH (ref 0.2–1.2)
Total Protein: 6.2 g/dL (ref 6.1–8.1)

## 2023-12-07 LAB — LIPID PANEL
Cholesterol: 112 mg/dL (ref <200)
HDL: 36 mg/dL — ABNORMAL LOW (ref >=40)
LDL Cholesterol (Calc): 48 mg/dL
Non-HDL Cholesterol (Calc): 76 mg/dL (ref <130)
Total CHOL/HDL Ratio: 3.1 (calc) (ref <5.0)
Triglycerides: 201 mg/dL — ABNORMAL HIGH (ref <150)

## 2023-12-10 ENCOUNTER — Ambulatory Visit: Payer: Self-pay | Admitting: Nurse Practitioner

## 2023-12-14 DIAGNOSIS — E1142 Type 2 diabetes mellitus with diabetic polyneuropathy: Secondary | ICD-10-CM | POA: Diagnosis not present

## 2023-12-14 DIAGNOSIS — L84 Corns and callosities: Secondary | ICD-10-CM | POA: Diagnosis not present

## 2023-12-14 DIAGNOSIS — L603 Nail dystrophy: Secondary | ICD-10-CM | POA: Diagnosis not present

## 2024-01-10 DIAGNOSIS — L97522 Non-pressure chronic ulcer of other part of left foot with fat layer exposed: Secondary | ICD-10-CM | POA: Diagnosis not present

## 2024-01-10 DIAGNOSIS — L03032 Cellulitis of left toe: Secondary | ICD-10-CM | POA: Diagnosis not present

## 2024-01-10 DIAGNOSIS — E1142 Type 2 diabetes mellitus with diabetic polyneuropathy: Secondary | ICD-10-CM | POA: Diagnosis not present

## 2024-01-12 ENCOUNTER — Other Ambulatory Visit: Payer: Self-pay | Admitting: Nurse Practitioner

## 2024-01-19 ENCOUNTER — Telehealth: Payer: Self-pay | Admitting: Cardiology

## 2024-01-19 MED ORDER — LISINOPRIL 2.5 MG PO TABS: 2.5000 mg | ORAL_TABLET | Freq: Every day | ORAL | 2 refills | Status: AC

## 2024-01-19 NOTE — Telephone Encounter (Signed)
 Pt's medication was sent to pt's pharmacy as requested. Confirmation received.

## 2024-01-19 NOTE — Telephone Encounter (Signed)
*  STAT* If patient is at the pharmacy, call can be transferred to refill team.   1. Which medications need to be refilled? (please list name of each medication and dose if known) lisinopril  (ZESTRIL ) 2.5 MG tablet    2. Would you like to learn more about the convenience, safety, & potential cost savings by using the [facility]?     3. Are you open to using the [facility] (Type [facility]. ).   4. Which pharmacy/location (including street and city if local pharmacy) is medication to be sent to? [id number] - [place], [place] - [address]    5. Do they need a 30 day or 90 day supply? 90 day

## 2024-01-20 DIAGNOSIS — E1142 Type 2 diabetes mellitus with diabetic polyneuropathy: Secondary | ICD-10-CM | POA: Diagnosis not present

## 2024-01-20 DIAGNOSIS — L97522 Non-pressure chronic ulcer of other part of left foot with fat layer exposed: Secondary | ICD-10-CM | POA: Diagnosis not present

## 2024-01-28 ENCOUNTER — Encounter (HOSPITAL_BASED_OUTPATIENT_CLINIC_OR_DEPARTMENT_OTHER): Payer: Self-pay | Admitting: Pulmonary Disease

## 2024-01-28 ENCOUNTER — Ambulatory Visit (HOSPITAL_BASED_OUTPATIENT_CLINIC_OR_DEPARTMENT_OTHER): Admitting: Pulmonary Disease

## 2024-01-28 VITALS — BP 148/73 | HR 62 | Ht 74.0 in | Wt 218.8 lb

## 2024-01-28 DIAGNOSIS — R030 Elevated blood-pressure reading, without diagnosis of hypertension: Secondary | ICD-10-CM | POA: Diagnosis not present

## 2024-01-28 DIAGNOSIS — G4733 Obstructive sleep apnea (adult) (pediatric): Secondary | ICD-10-CM | POA: Diagnosis not present

## 2024-01-28 NOTE — Progress Notes (Signed)
   Subjective:    Patient ID: [name] [name], male    DOB: 03/03/1951, 73 y.o.   MRN: [id number]  73 yo for FU of OSA  -He clearly has positional OSA and this is worse in supine position.   PMH :   CAD, s/p CABG x 4 -2014 HFrEF -EF 45-50% Hypertension Type 2 diabetes      Discussed the use of AI scribe software for clinical note transcription with the patient, who gave verbal consent to proceed.  History of Present Illness [name] [name] is a 73 year old male with obstructive sleep apnea who presents for follow-up after receiving a new CPAP machine.  He uses the new CPAP machine consistently and feels more rested and energetic. He uses a full mask and experiences no dry mouth upon waking. The CPAP machine has reduced apnea events from 15-20 per hour to about 3.7 per hour, which he finds satisfactory. He typically wakes up once or twice during the night to use the bathroom and takes a sip of water. He has not received additional mask parts to address issues with his current mask but is open to trying a new version to help with the leak.   DL - excellent compliance, 9h/night,avg pr 9  Significant tests/ events reviewed   NPSG 03/2020 - wt 220 lbs - TST 371 mins, AHI 15/hour, worse during supine sleep, low saturation 79%   CT chest with contrast 08/2019 stable lung nodules  Review of Systems  neg for any significant sore throat, dysphagia, itching, sneezing, nasal congestion or excess/ purulent secretions, fever, chills, sweats, unintended wt loss, pleuritic or exertional cp, hempoptysis, orthopnea pnd or change in chronic leg swelling. Also denies presyncope, palpitations, heartburn, abdominal pain, nausea, vomiting, diarrhea or change in bowel or urinary habits, dysuria,hematuria, rash, arthralgias, visual complaints, headache, numbness weakness or ataxia.      Objective:   Physical Exam  Gen. Pleasant, well-nourished, in no distress ENT - no thrush, no pallor/icterus,no  post nasal drip Neck: No JVD, no thyromegaly, no carotid bruits Lungs: no use of accessory muscles, no dullness to percussion, clear without rales or rhonchi  Cardiovascular: Rhythm regular, heart sounds  normal, no murmurs or gallops, no peripheral edema Musculoskeletal: No deformities, no cyanosis or clubbing        Assessment & Plan:   Assessment and Plan Assessment & Plan Obstructive Sleep Apnea (OSA) OSA is well-controlled with CPAP therapy. He reports increased restfulness and energy. CPAP has definitely helped. CPAP settings are 5-15 cm H2O, with optimal pressure at 9 cm H2O and maximum at 11 cm H2O. A large mask leak may affect treatment efficacy. Apnea events reduced from 15-20 per hour to 3.7 per hour. - Adjust CPAP pressure settings to 8 cm H2O with a maximum of 11 cm H2O. - Address mask leak by obtaining a new mask or replacement cushions. - Consider AirFit F30 mask to reduce leaks. - Schedule follow-up in six months to review CPAP report and assess treatment efficacy.  Elevated Blood Pressure Blood pressure is slightly elevated, possibly due to white coat syndrome.

## 2024-01-28 NOTE — Patient Instructions (Addendum)
 X Trial of airfit F30 full face mask , new cushions  X change to auto 8-12 cm   VISIT SUMMARY: You had a follow-up appointment to discuss your obstructive sleep apnea and the use of your new CPAP machine. You reported feeling more rested and energetic with the new machine, and your apnea events have significantly decreased. We also noted a slight elevation in your blood pressure, which may be due to white coat syndrome.  YOUR PLAN: -OBSTRUCTIVE SLEEP APNEA (OSA): Obstructive Sleep Apnea is a condition where your airway becomes blocked during sleep, causing breathing pauses. Your CPAP therapy is working well, reducing your apnea events from 15-20 per hour to 3.7 per hour. We will adjust your CPAP pressure settings to 9 cm H2O with a maximum of 11 cm H2O. To address the mask leak, you should obtain a new mask or replacement cushions. Consider trying the AirFit F30 mask to reduce leaks. We will schedule a follow-up in six months to review your CPAP report and assess the treatment's effectiveness.  -ELEVATED BLOOD PRESSURE: Your blood pressure was slightly elevated today, which might be due to white coat syndrome, a condition where blood pressure rises in a medical setting. We will continue to monitor your blood pressure to ensure it remains within a healthy range.  INSTRUCTIONS: Please adjust your CPAP pressure settings to 9 cm H2O with a maximum of 11 cm H2O. Obtain a new mask or replacement cushions to address the mask leak, and consider trying the AirFit F30 mask. Schedule a follow-up appointment in six months to review your CPAP report and assess the treatment's effectiveness. Continue to monitor your blood pressure regularly.                      Contains text generated by Abridge.                                 Contains text generated by Abridge.

## 2024-01-28 NOTE — Addendum Note (Signed)
 Addended by: TRUDY WARREN CROME on: 01/28/2024 11:13 AM   Modules accepted: Orders

## 2024-02-14 ENCOUNTER — Telehealth: Payer: Self-pay

## 2024-02-14 DIAGNOSIS — G4733 Obstructive sleep apnea (adult) (pediatric): Secondary | ICD-10-CM | POA: Diagnosis not present

## 2024-02-14 NOTE — Telephone Encounter (Signed)
 Copied from CRM [id number]. Topic: Clinical - Order For Equipment >> Feb 14, 2024  3:09 PM Chantha C wrote: Reason for CRM: Dickey from [facility] [phone] is calling on the behalf of the patient. Patient has not received a new mask for the cpap machine and the setting was suppose to be changed. Please advise and call patient back.      Spoke wife okay per HIPAA -mask and setting were sent on 07/18 and to reach out to [facility]    NFN-

## 2024-02-15 DIAGNOSIS — M79672 Pain in left foot: Secondary | ICD-10-CM | POA: Diagnosis not present

## 2024-02-15 DIAGNOSIS — M722 Plantar fascial fibromatosis: Secondary | ICD-10-CM | POA: Diagnosis not present

## 2024-02-16 DIAGNOSIS — L738 Other specified follicular disorders: Secondary | ICD-10-CM | POA: Diagnosis not present

## 2024-02-16 DIAGNOSIS — C44319 Basal cell carcinoma of skin of other parts of face: Secondary | ICD-10-CM | POA: Diagnosis not present

## 2024-02-16 DIAGNOSIS — C44329 Squamous cell carcinoma of skin of other parts of face: Secondary | ICD-10-CM | POA: Diagnosis not present

## 2024-02-22 DIAGNOSIS — L603 Nail dystrophy: Secondary | ICD-10-CM | POA: Diagnosis not present

## 2024-02-22 DIAGNOSIS — L84 Corns and callosities: Secondary | ICD-10-CM | POA: Diagnosis not present

## 2024-02-22 DIAGNOSIS — S90822A Blister (nonthermal), left foot, initial encounter: Secondary | ICD-10-CM | POA: Diagnosis not present

## 2024-02-22 DIAGNOSIS — E1142 Type 2 diabetes mellitus with diabetic polyneuropathy: Secondary | ICD-10-CM | POA: Diagnosis not present

## 2024-02-28 ENCOUNTER — Ambulatory Visit: Admitting: Nurse Practitioner

## 2024-02-28 ENCOUNTER — Encounter: Payer: Self-pay | Admitting: Nurse Practitioner

## 2024-02-28 ENCOUNTER — Ambulatory Visit (INDEPENDENT_AMBULATORY_CARE_PROVIDER_SITE_OTHER): Admitting: Nurse Practitioner

## 2024-02-28 VITALS — BP 122/78 | HR 69 | Ht 74.0 in | Wt 217.8 lb

## 2024-02-28 DIAGNOSIS — N1831 Chronic kidney disease, stage 3a: Secondary | ICD-10-CM

## 2024-02-28 DIAGNOSIS — I10 Essential (primary) hypertension: Secondary | ICD-10-CM

## 2024-02-28 DIAGNOSIS — Z7984 Long term (current) use of oral hypoglycemic drugs: Secondary | ICD-10-CM

## 2024-02-28 DIAGNOSIS — E1122 Type 2 diabetes mellitus with diabetic chronic kidney disease: Secondary | ICD-10-CM | POA: Diagnosis not present

## 2024-02-28 DIAGNOSIS — E782 Mixed hyperlipidemia: Secondary | ICD-10-CM

## 2024-02-28 LAB — POCT GLYCOSYLATED HEMOGLOBIN (HGB A1C): Hemoglobin A1C: 8.9 % — AB (ref 4.0–5.6)

## 2024-02-28 NOTE — Progress Notes (Signed)
 02/28/2024        Endocrinology follow-up note     Subjective:    Patient ID: [name] [name], male    DOB: June 06, 1951, PCP Lari Elspeth BRAVO, MD   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-grade neoplasm)   Arthritis    Coronary artery disease    NSTEMI 102014; multivessel CAD s/p post CABG (LIMA-LAD, SVG-D1, SVG-D2, SVG-OM)   Essential hypertension    GERD (gastroesophageal reflux disease)    Gout    History of kidney stones    History of peptic ulcer disease    Hyperlipidemia    Left ureteral stone    Myocardial infarction (HCC)    Nodule of right lung    CT 04-04-2017  right lower lobe multiple nodules   OSA on CPAP    Renal cyst, right    CT 04-04-2017   Renal insufficiency    Type 2 diabetes mellitus (HCC)    Wears glasses    Past Surgical History:  Procedure Laterality Date   ANAL FISTULOTOMY N/A 11/05/2023   Procedure: ANAL FISTULOTOMY;  Surgeon: Debby Hila, MD;  Location: [facility];  Service: General;  Laterality: N/A;   CARDIOVASCULAR STRESS TEST  11-13-2016   dr debera   Intermediate risk nuclear study w/ medium defect of moderate severity in the basal inferoseptal, basal inferior, mid inferoseptal and mid inferior location (findings consistant w/ prior myocardial infarction)/  mild enlarged LV cavity size;  nuclear stress EF 45%   COLONOSCOPY N/A 04/13/2017   Procedure: COLONOSCOPY;  Surgeon: Mavis Anes, MD;  [facility];  Service: Gastroenterology;  Laterality: N/A;   COLONOSCOPY WITH PROPOFOL  N/A 04/14/2023   Procedure: COLONOSCOPY WITH PROPOFOL ;  Surgeon: Shaaron Lamar HERO, MD;  [facility];  Service: Endoscopy;  Laterality: N/A;  830am, asa 3   CORONARY ARTERY BYPASS GRAFT N/A 04/25/2013   Procedure: CORONARY ARTERY BYPASS GRAFTING (CABG);  Surgeon: Dorise MARLA Fellers, MD;  Location: [facility] OR;  Service: Open Heart Surgery;  Laterality: N/A;   Coronary artery bypass graft times four, on pump, using left internal mammary artery and right greater saphenous vein via endovein harvest.   CYSTOSCOPY WITH RETROGRADE PYELOGRAM, URETEROSCOPY AND STENT PLACEMENT Left 04/08/2017   Procedure: CYSTOSCOPY WITH RETROGRADE PYELOGRAM, URETEROSCOPY,STENT PLACEMENT;  Surgeon: Carolee Sherwood JONETTA DOUGLAS, MD;  Location: [facility];  Service: Urology;  Laterality: Left;   CYSTOSCOPY/URETEROSCOPY/HOLMIUM LASER/STENT PLACEMENT Left 05/17/2017   Procedure: CYSTOSCOPY / RETROGRADE PYELOGRAM / URETEROSCOPY / HOLMIUM LASER / STONE BASKETRY / STENT EXCHANGE;  Surgeon: Carolee Sherwood JONETTA DOUGLAS, MD;  Location: [facility];  Service: Urology;  Laterality: Left;  ONLY NEEDS 45 MIN FOR PROCEDURE   ELBOW BURSA SURGERY Right 03-10-2017     at [facility]   and removal of spur   INTRAOPERATIVE TRANSESOPHAGEAL ECHOCARDIOGRAM N/A 04/25/2013   Procedure: INTRAOPERATIVE TRANSESOPHAGEAL ECHOCARDIOGRAM;  Surgeon: Dorise MARLA Fellers, MD;  Location: [facility] OR;  Service: Open Heart Surgery;  Laterality: N/A;   KNEE ARTHROSCOPY Left 10/ 2017  approx.    dr gerome   LAPAROSCOPIC RIGHT HEMI COLECTOMY Right 04/19/2017   Procedure: OPEN RIGHT HEMI COLECTOMY;  Surgeon: Kallie Manuelita BROCKS, MD;  Location: [facility];  Service: General;  Laterality: Right;   LAPAROSCOPY N/A 04/19/2017   Procedure: LAPAROSCOPY DIAGNOSTIC;  Surgeon: Kallie Manuelita BROCKS, MD;  Location: [facility];  Service: General;  Laterality: N/A;  patient  knows to arrive at 6:15   LEFT HEART CATHETERIZATION WITH CORONARY ANGIOGRAM N/A 04/20/2013   Procedure: LEFT HEART CATHETERIZATION WITH CORONARY ANGIOGRAM;  Surgeon: Ozell JONETTA Fell, MD;  Location: [facility];  Service: Cardiovascular;  Laterality: N/A;   NASAL SEPTUM SURGERY  1990s approx   RECTAL EXAM UNDER ANESTHESIA N/A 11/05/2023   Procedure: EXAM UNDER ANESTHESIA, RECTUM;  Surgeon: Debby Hila, MD;  Location: [facility];  Service: General;  Laterality:  N/A;   TONSILLECTOMY  92 (age 48)   TRANSTHORACIC ECHOCARDIOGRAM  11-17-2016   dr debera   mild LVH,  ef 55-60%, LV wall motion & diastolic function indeterminant assessment,  images were inadequate/  mild LAE/  trivial Tr   TYMPANOSTOMY TUBE PLACEMENT     Social History   Socioeconomic History   Marital status: Married    Spouse name: Not on file   Number of children: Not on file   Years of education: Not on file   Highest education level: Not on file  Occupational History   Occupation: Drives truck    Employer: [employer]  Tobacco Use   Smoking status: Never   Smokeless tobacco: Never  Vaping Use   Vaping status: Never Used  Substance and Sexual Activity   Alcohol  use: No    Alcohol /week: 0.0 standard drinks of alcohol    Drug use: No   Sexual activity: Yes    Birth control/protection: None  Other Topics Concern   Not on file  Social History Narrative   Lives with wife, does not exercise but part of his job is strenuous. Works in a body-shop part-time, too.   2 of his mother's sisters and 1 brother died suddenly (family was told MI but no autopsy).    Social Drivers of Corporate investment banker Strain: Low Risk  (05/11/2022)   Overall Financial Resource Strain (CARDIA)    Difficulty of Paying Living Expenses: Not hard at all  Food Insecurity: No Food Insecurity (03/05/2022)   Hunger Vital Sign    Worried About Running Out of Food in the Last Year: Never true    Ran Out of Food in the Last Year: Never true  Transportation Needs: No Transportation Needs (05/11/2022)   PRAPARE - Administrator, Civil Service (Medical): No    Lack of Transportation (Non-Medical): No  Physical Activity: Not on file  Stress: Not on file  Social Connections: Not on file   Outpatient Encounter Medications as of 02/28/2024  Medication Sig   allopurinol (ZYLOPRIM) 300 MG tablet Take 300 mg by mouth daily.   Ascorbic Acid  (VITAMIN C ) 1000 MG tablet Take 1,000 mg by  mouth daily.   aspirin  EC 81 MG tablet Take 1 tablet (81 mg total) by mouth daily. Swallow whole.   Blood Glucose Monitoring Suppl (ONE TOUCH ULTRA 2) w/Device KIT USE TO TEST BLOOD ONCE A DAY   empagliflozin  (JARDIANCE ) 10 MG TABS tablet Take 1 tablet (10 mg total) by mouth daily before breakfast.   escitalopram  (LEXAPRO ) 10 MG tablet Take 10 mg by mouth at bedtime.    ezetimibe  (ZETIA ) 10 MG tablet TAKE 1 TABLET BY MOUTH EVERY DAY   glucose blood (ONETOUCH ULTRA) test strip Use as instructed to monitor glucose once daily   Lancets (ONETOUCH ULTRASOFT) lancets Use as instructed to monitor glucose once daily   lisinopril  (ZESTRIL ) 2.5 MG tablet Take 1 tablet (2.5 mg total) by mouth daily.   metFORMIN  (GLUCOPHAGE ) 500 MG tablet Take 1  tablet (500 mg total) by mouth daily.   metoprolol  tartrate (LOPRESSOR ) 25 MG tablet Take 0.5 tablets (12.5 mg total) by mouth 2 (two) times daily.   Multiple Vitamin (MULTIVITAMIN WITH MINERALS) TABS tablet Take 1 tablet by mouth daily.   Omega-3 Fatty Acids (FISH OIL) 1000 MG CPDR Take 1 capsule by mouth 2 (two) times a day.    omeprazole (PRILOSEC) 20 MG capsule Take 20 mg by mouth every morning.    pyridoxine  (B-6) 200 MG tablet Take 200 mg by mouth daily.   traMADol  (ULTRAM ) 50 MG tablet Take 1-2 tablets (50-100 mg total) by mouth every 6 (six) hours as needed.   simvastatin  (ZOCOR ) 20 MG tablet Take 1 tablet (20 mg total) by mouth at bedtime.   No facility-administered encounter medications on file as of 02/28/2024.   ALLERGIES: No Known Allergies VACCINATION STATUS: Immunization History  Administered Date(s) Administered   Influenza Split 02/10/2014   Influenza,inj,Quad PF,6+ Mos 04/20/2017    Diabetes He presents for his follow-up diabetic visit. He has type 2 diabetes mellitus. His disease course has been fluctuating. There are no hypoglycemic associated symptoms. Pertinent negatives for hypoglycemia include no confusion, headaches, pallor or  seizures. Pertinent negatives for diabetes include no chest pain, no fatigue, no foot ulcerations, no polydipsia, no polyphagia, no polyuria and no weakness. There are no hypoglycemic complications. Symptoms are resolved. Diabetic complications include heart disease and nephropathy. Risk factors for coronary artery disease include diabetes mellitus, dyslipidemia, hypertension, male sex and sedentary lifestyle. Current diabetic treatment includes oral agent (dual therapy). He is compliant with treatment all of the time. His weight is fluctuating minimally. He is following a generally healthy diet. When asked about meal planning, he reported none. He has not had a previous visit with a dietitian. He never participates in exercise. (He presents today with no logs or meter to review.  His POCT A1c today is 8.9%, increasing from last visit of 7.3%.  He admits he has not done the best with diet recently, also notes he had a steroid injection for bone spur between visits. ) An ACE inhibitor/angiotensin II receptor blocker is being taken. He does not see a podiatrist.Eye exam is current.  Hyperlipidemia This is a chronic problem. The current episode started more than 1 year ago. The problem is uncontrolled. Recent lipid tests were reviewed and are variable. Exacerbating diseases include chronic renal disease and diabetes. Factors aggravating his hyperlipidemia include beta blockers. Pertinent negatives include no chest pain, myalgias or shortness of breath. Current antihyperlipidemic treatment includes statins. The current treatment provides mild improvement of lipids. Compliance problems include adherence to diet and adherence to exercise.  Risk factors for coronary artery disease include diabetes mellitus, dyslipidemia, hypertension, male sex and a sedentary lifestyle.  Hypertension This is a chronic problem. The current episode started more than 1 year ago. The problem has been resolved since onset. The problem is  controlled. Pertinent negatives include no chest pain, headaches, neck pain, palpitations or shortness of breath. There are no associated agents to hypertension. Risk factors for coronary artery disease include dyslipidemia, diabetes mellitus, male gender and sedentary lifestyle. Past treatments include beta blockers and ACE inhibitors. The current treatment provides mild improvement. There are no compliance problems.  Hypertensive end-organ damage includes kidney disease and CAD/MI. Identifiable causes of hypertension include chronic renal disease.   Review of systems  Constitutional: + Minimally fluctuating body weight,  current Body mass index is 27.96 kg/m. , no fatigue, no subjective hyperthermia, no subjective  hypothermia Eyes: no blurry vision, no xerophthalmia ENT: no sore throat, no nodules palpated in throat, no dysphagia/odynophagia, no hoarseness Cardiovascular: no chest pain, no shortness of breath, no palpitations, no leg swelling Respiratory: no cough, no shortness of breath Gastrointestinal: no nausea/vomiting/diarrhea Musculoskeletal: no muscle/joint aches Skin: no rashes, no hyperemia Neurological: no tremors, no numbness, no tingling, no dizziness Psychiatric: no depression, no anxiety    Objective:    BP 122/78 (BP Location: Left Arm, Patient Position: Sitting, Cuff Size: Large)   Pulse 69   Ht 6' 2 (1.88 m)   Wt 217 lb 12.8 oz (98.8 kg)   BMI 27.96 kg/m   Wt Readings from Last 3 Encounters:  02/28/24 217 lb 12.8 oz (98.8 kg)  01/28/24 218 lb 12.8 oz (99.2 kg)  11/05/23 211 lb 13.8 oz (96.1 kg)    BP Readings from Last 3 Encounters:  02/28/24 122/78  01/28/24 (!) 148/73  11/05/23 (!) 140/69      Physical Exam- Limited  Constitutional:  Body mass index is 27.96 kg/m. , not in acute distress, normal state of mind Eyes:  EOMI, no exophthalmos Musculoskeletal: no gross deformities, strength intact in all four extremities, no gross restriction of joint  movements Skin:  no rashes, no hyperemia Neurological: no tremor with outstretched hands   Diabetic Foot Exam - Simple   No data filed     CMP     Component Value Date/Time   NA 136 11/03/2023 1025   NA 143 10/13/2021 0935   K 4.9 11/03/2023 1025   CL 99 11/03/2023 1025   CO2 25 11/03/2023 1025   GLUCOSE 266 (H) 11/03/2023 1025   BUN 19 11/03/2023 1025   BUN 16 10/13/2021 0935   CREATININE 1.43 (H) 11/03/2023 1025   CREATININE 1.35 (H) 09/28/2023 0952   CALCIUM  9.7 11/03/2023 1025   PROT 6.2 12/07/2023 1018   PROT 6.1 10/13/2021 0935   ALBUMIN  4.0 10/13/2021 0935   AST 23 12/07/2023 1018   ALT 20 12/07/2023 1018   ALKPHOS 99 10/13/2021 0935   BILITOT 1.4 (H) 12/07/2023 1018   BILITOT 0.8 10/13/2021 0935   GFRNONAA 53 (L) 11/03/2023 1025   GFRNONAA 52 (L) 01/23/2020 1002   GFRAA 47 (L) 07/24/2020 0932   GFRAA 60 01/23/2020 1002     Diabetic Labs (most recent): Lab Results  Component Value Date   HGBA1C 8.9 (A) 02/28/2024   HGBA1C 7.3 (A) 10/28/2023   HGBA1C 7.8 (A) 06/28/2023   MICROALBUR 80 02/16/2022   MICROALBUR 80 12/16/2020   MICROALBUR 7.3 09/09/2017     Lipid Panel ( most recent) Lipid Panel     Component Value Date/Time   CHOL 112 12/07/2023 1018   CHOL 161 11/20/2020 1017   TRIG 201 (H) 12/07/2023 1018   HDL 36 (L) 12/07/2023 1018   HDL 43 11/20/2020 1017   CHOLHDL 3.1 12/07/2023 1018   VLDL 21 07/02/2016 0853   LDLCALC 48 12/07/2023 1018     Assessment & Plan:   1) Type 2 diabetes mellitus with other circulatory complications , and acute on chronic renal insufficiency   His diabetes is complicated by coronary artery disease status post coronary artery bypass graft, acute on chronic renal insufficiency and patient remains at a high risk for more acute and chronic complications of diabetes which include CAD, CVA, CKD, retinopathy, and neuropathy. These are all discussed in detail with the patient.  He presents today with no logs or meter  to review.  His POCT A1c  today is 8.9%, increasing from last visit of 7.3%.  He admits he has not done the best with diet recently, also notes he had a steroid injection for bone spur between visits.   - Recent labs reviewed.   - Nutritional counseling repeated at each appointment due to patients tendency to fall back in to old habits.  - The patient admits there is a room for improvement in their diet and drink choices. -  Suggestion is made for the patient to avoid simple carbohydrates from their diet including Cakes, Sweet Desserts / Pastries, Ice Cream, Soda (diet and regular), Sweet Tea, Candies, Chips, Cookies, Sweet Pastries, Store Bought Juices, Alcohol  in Excess of 1-2 drinks a day, Artificial Sweeteners, Coffee Creamer, and Sugar-free Products. This will help patient to have stable blood glucose profile and potentially avoid unintended weight gain.   - I encouraged the patient to switch to unprocessed or minimally processed complex starch and increased protein intake (animal or plant source), fruits, and vegetables.   - Patient is advised to stick to a routine mealtimes to eat 3 meals a day and avoid unnecessary snacks (to snack only to correct hypoglycemia).  - I have approached patient with the following individualized plan to manage diabetes and patient agrees.  -Although his A1c is worse today (likely effect of steroid injection), no changes will be made to his medications.  Instead, he is encouraged to be more vigilant with diet.  He is advised to continue his Metformin  500 mg po daily with breakfast and Jardiance  10 mg po daily.    -I did encourage him to continue monitoring glucose once daily 1-2 times per week to help keep him on track.   2) BP/HTN:  His blood pressure is controlled to target.  He is advised to continue meds as prescribed by his PCP.  3) Lipids/HPL:  His most recent lipid panel from 12/07/23 shows controlled LDL of 48 and elevated triglycerides of 201  (improving).  He is advised to continue Lovastatin  20 mg po daily at bedtime and fish oil supplement twice daily.  Encouraged him to avoid fried foods and butter.    4) Weight management:  His Body mass index is 27.96 kg/m.-a candidate for modest weight loss.  Detailed carbohydrates and exercise regimen was discussed with him.  5) Chronic Care/Health Maintenance: -Patient is on ACE and Statin medications and encouraged to continue to follow up with Ophthalmology, Podiatrist at least yearly or according to recommendations, and advised to  stay away from smoking. I have recommended yearly flu vaccine and pneumonia vaccination at least every 5 years; moderate intensity exercise for up to 150 minutes weekly; and  sleep for at least 7 hours a day.   - I advised patient to maintain close follow up with Burdine, Steven E, MD for primary care needs.     I spent  29  minutes in the care of the patient today including review of labs from CMP, Lipids, Thyroid  Function, Hematology (current and previous including abstractions from other facilities); face-to-face time discussing  his blood glucose readings/logs, discussing hypoglycemia and hyperglycemia episodes and symptoms, medications doses, his options of short and long term treatment based on the latest standards of care / guidelines;  discussion about incorporating lifestyle medicine;  and documenting the encounter. Risk reduction counseling performed per USPSTF guidelines to reduce obesity and cardiovascular risk factors.     Please refer to Patient Instructions for Blood Glucose Monitoring and Insulin /Medications Dosing Guide  in media tab for  additional information. Please  also refer to  Patient Self Inventory in the Media  tab for reviewed elements of pertinent patient history.  [name] [name] participated in the discussions, expressed understanding, and voiced agreement with the above plans.  All questions were answered to his satisfaction. he  is encouraged to contact clinic should he have any questions or concerns prior to his return visit.   Follow up plan: -Return in about 4 months (around 06/29/2024) for Diabetes F/U with A1c in office, No previsit labs.    Benton Rio, [facility] [address] [place], [place] [address] Phone: [phone] Fax: [phone]  02/28/2024, 3:49 PM

## 2024-02-29 DIAGNOSIS — K6289 Other specified diseases of anus and rectum: Secondary | ICD-10-CM | POA: Diagnosis not present

## 2024-03-02 DIAGNOSIS — R400 Somnolence: Secondary | ICD-10-CM | POA: Diagnosis not present

## 2024-03-02 DIAGNOSIS — G4733 Obstructive sleep apnea (adult) (pediatric): Secondary | ICD-10-CM | POA: Diagnosis not present

## 2024-03-14 DIAGNOSIS — M722 Plantar fascial fibromatosis: Secondary | ICD-10-CM | POA: Diagnosis not present

## 2024-03-14 DIAGNOSIS — M79672 Pain in left foot: Secondary | ICD-10-CM | POA: Diagnosis not present

## 2024-03-14 DIAGNOSIS — S90822D Blister (nonthermal), left foot, subsequent encounter: Secondary | ICD-10-CM | POA: Diagnosis not present

## 2024-03-16 DIAGNOSIS — G4733 Obstructive sleep apnea (adult) (pediatric): Secondary | ICD-10-CM | POA: Diagnosis not present

## 2024-04-15 ENCOUNTER — Other Ambulatory Visit: Payer: Self-pay | Admitting: Cardiology

## 2024-04-15 DIAGNOSIS — G4733 Obstructive sleep apnea (adult) (pediatric): Secondary | ICD-10-CM | POA: Diagnosis not present

## 2024-04-24 ENCOUNTER — Ambulatory Visit: Attending: Cardiology | Admitting: Cardiology

## 2024-04-24 ENCOUNTER — Encounter: Payer: Self-pay | Admitting: Cardiology

## 2024-04-24 VITALS — BP 132/68 | HR 57 | Ht 74.0 in | Wt 220.4 lb

## 2024-04-24 DIAGNOSIS — E782 Mixed hyperlipidemia: Secondary | ICD-10-CM | POA: Diagnosis not present

## 2024-04-24 DIAGNOSIS — R001 Bradycardia, unspecified: Secondary | ICD-10-CM | POA: Diagnosis not present

## 2024-04-24 DIAGNOSIS — I5020 Unspecified systolic (congestive) heart failure: Secondary | ICD-10-CM

## 2024-04-24 DIAGNOSIS — I10 Essential (primary) hypertension: Secondary | ICD-10-CM | POA: Diagnosis not present

## 2024-04-24 DIAGNOSIS — I25119 Atherosclerotic heart disease of native coronary artery with unspecified angina pectoris: Secondary | ICD-10-CM

## 2024-04-24 NOTE — Patient Instructions (Addendum)
 Medication Instructions:  Your physician has recommended you make the following change in your medication:  Stop metoprolol  tartrate Continue all other medications as prescribed  Labwork: none  Testing/Procedures: Your physician has requested that you have an echocardiogram. Echocardiography is a painless test that uses sound waves to create images of your heart. It provides your doctor with information about the size and shape of your heart and how well your heart's chambers and valves are working. This procedure takes approximately one hour. There are no restrictions for this procedure. Please do NOT wear cologne, perfume, aftershave, or lotions (deodorant is allowed). Please arrive 15 minutes prior to your appointment time.  Please note: We ask at that you not bring children with you during ultrasound (echo/ vascular) testing. Due to room size and safety concerns, children are not allowed in the ultrasound rooms during exams. Our front office staff cannot provide observation of children in our lobby area while testing is being conducted. An adult accompanying a patient to their appointment will only be allowed in the ultrasound room at the discretion of the ultrasound technician under special circumstances. We apologize for any inconvenience.  Follow-Up: Your physician recommends that you schedule a follow-up appointment in: 6 months  Any Other Special Instructions Will Be Listed Below (If Applicable).  If you need a refill on your cardiac medications before your next appointment, please call your pharmacy.

## 2024-04-24 NOTE — Progress Notes (Signed)
 Cardiology Office Note  Date: 04/24/2024   ID: LAINE GIOVANETTI, DOB Mar 26, 1951, MRN [id number]  History of Present Illness: [name] [name] is a 73 y.o. male last seen in March by Ms. Miriam NP, I reviewed her note.  Our last visit was in 2023.  He is here for a routine visit.  He does not describe any exertional chest pain, generally NYHA class II dyspnea.  No leg swelling.  He is retired but still helps out at a [employer] treasure[employer].  I went over his medications.  We discussed stopping Lopressor  at this point given bradycardia and evidence of conduction system disease by ECG.  He does not report any dizziness or syncope.  His LDL was 48 back in May.  Echocardiogram from February of last year showed LVEF 45 to 50% range.  We discussed getting an updated study.  Physical Exam: VS:  BP 132/68   Pulse (!) 57   Ht 6' 2 (1.88 m)   Wt 220 lb 6.4 oz (100 kg)   SpO2 94%   BMI 28.30 kg/m , BMI Body mass index is 28.3 kg/m.  Wt Readings from Last 3 Encounters:  04/24/24 220 lb 6.4 oz (100 kg)  02/28/24 217 lb 12.8 oz (98.8 kg)  01/28/24 218 lb 12.8 oz (99.2 kg)    General: Patient appears comfortable at rest. HEENT: Conjunctiva and lids normal. Neck: Supple, no elevated JVP or carotid bruits. Lungs: Clear to auscultation, nonlabored breathing at rest. Cardiac: Regular rate and rhythm, no S3 or significant systolic murmur. Extremities: No pitting edema.  ECG:  An ECG dated 09/21/2023 was personally reviewed today and demonstrated:  Sinus rhythm with prolonged PR interval, old inferior infarct pattern and nonspecific ST-T changes.  Labwork: 11/03/2023: BUN 19; Creatinine, Ser 1.43; Potassium 4.9; Sodium 136 12/07/2023: ALT 20; AST 23     Component Value Date/Time   CHOL 112 12/07/2023 1018   CHOL 161 11/20/2020 1017   TRIG 201 (H) 12/07/2023 1018   HDL 36 (L) 12/07/2023 1018   HDL 43 11/20/2020 1017   CHOLHDL 3.1 12/07/2023 1018   VLDL 21 07/02/2016 0853   LDLCALC 48  12/07/2023 1018   Other Studies Reviewed Today:  Echocardiogram 08/25/2022:  1. Left ventricular ejection fraction, by estimation, is 45 to 50%. The  left ventricle has mildly decreased function. The left ventricle has no  regional wall motion abnormalities. Left ventricular diastolic parameters  are indeterminate.   2. Right ventricular systolic function is mildly reduced. The right  ventricular size is normal.   3. Left atrial size was mildly dilated.   4. The mitral valve is normal in structure. Trivial mitral valve  regurgitation. No evidence of mitral stenosis.   5. The aortic valve is tricuspid. There is mild calcification of the  aortic valve. Aortic valve regurgitation is not visualized. No aortic  stenosis is present.   Assessment and Plan:  1.  Multivessel CAD status post CABG in 2014 with LIMA to LAD, SVG to first diagonal, SVG to second diagonal, and SVG to obtuse marginal.  Lexiscan  Myoview  in January 2024 showed small region of ischemia in the mid inferolateral wall with scar involving the mid to basal inferior wall.  He does not report any angina at this time with typical activities.  I reviewed his ECG from earlier in the year.  Plan to stop low-dose Lopressor  at this point given bradycardia and evidence of conduction system disease by ECG.  Otherwise continue aspirin  81  mg daily, Jardiance  10 mg daily, Zocor  20 mg daily, and Zetia  10 mg daily.  2.  HFmrEF, LVEF 45 to 50% by echocardiogram in February 2024.  Plan to update echocardiogram.  3.  Mixed hyperlipidemia.  LDL 48 in May.  Continue Zocor  20 mg daily and Zetia  10 mg daily.  4.  Primary hypertension.  No change in current regimen which includes lisinopril  2.5 mg daily.  5.  CKD stage IIIa, creatinine 1.43 with GFR 53.  Disposition:  Follow up 6 months.  Signed, Jayson JUDITHANN Sierras, M.D., F.A.C.C. [facility]

## 2024-05-02 ENCOUNTER — Other Ambulatory Visit: Payer: Self-pay | Admitting: Nurse Practitioner

## 2024-05-09 ENCOUNTER — Ambulatory Visit: Attending: Cardiology

## 2024-05-09 ENCOUNTER — Ambulatory Visit: Payer: Self-pay | Admitting: Cardiology

## 2024-05-09 DIAGNOSIS — I5020 Unspecified systolic (congestive) heart failure: Secondary | ICD-10-CM | POA: Diagnosis not present

## 2024-05-09 LAB — ECHOCARDIOGRAM COMPLETE
AR max vel: 3.6 cm2
AV Peak grad: 5.2 mmHg
Ao pk vel: 1.14 m/s
Area-P 1/2: 3.3 cm2
Calc EF: 49.6 %
S' Lateral: 4.3 cm
Single Plane A2C EF: 47.7 %
Single Plane A4C EF: 50 %

## 2024-05-11 DIAGNOSIS — L84 Corns and callosities: Secondary | ICD-10-CM | POA: Diagnosis not present

## 2024-05-11 DIAGNOSIS — E1142 Type 2 diabetes mellitus with diabetic polyneuropathy: Secondary | ICD-10-CM | POA: Diagnosis not present

## 2024-05-11 DIAGNOSIS — L603 Nail dystrophy: Secondary | ICD-10-CM | POA: Diagnosis not present

## 2024-05-18 DIAGNOSIS — N1832 Chronic kidney disease, stage 3b: Secondary | ICD-10-CM | POA: Diagnosis not present

## 2024-05-18 DIAGNOSIS — I10 Essential (primary) hypertension: Secondary | ICD-10-CM | POA: Diagnosis not present

## 2024-05-18 DIAGNOSIS — E1169 Type 2 diabetes mellitus with other specified complication: Secondary | ICD-10-CM | POA: Diagnosis not present

## 2024-05-18 DIAGNOSIS — E7800 Pure hypercholesterolemia, unspecified: Secondary | ICD-10-CM | POA: Diagnosis not present

## 2024-05-18 DIAGNOSIS — R5383 Other fatigue: Secondary | ICD-10-CM | POA: Diagnosis not present

## 2024-05-18 DIAGNOSIS — Z130 Encounter for screening for diseases of the blood and blood-forming organs and certain disorders involving the immune mechanism: Secondary | ICD-10-CM | POA: Diagnosis not present

## 2024-05-18 DIAGNOSIS — Z1329 Encounter for screening for other suspected endocrine disorder: Secondary | ICD-10-CM | POA: Diagnosis not present

## 2024-05-18 DIAGNOSIS — E7849 Other hyperlipidemia: Secondary | ICD-10-CM | POA: Diagnosis not present

## 2024-05-23 DIAGNOSIS — Z6828 Body mass index (BMI) 28.0-28.9, adult: Secondary | ICD-10-CM | POA: Diagnosis not present

## 2024-05-23 DIAGNOSIS — C44310 Basal cell carcinoma of skin of unspecified parts of face: Secondary | ICD-10-CM | POA: Diagnosis not present

## 2024-05-23 DIAGNOSIS — E1169 Type 2 diabetes mellitus with other specified complication: Secondary | ICD-10-CM | POA: Diagnosis not present

## 2024-05-23 DIAGNOSIS — Z23 Encounter for immunization: Secondary | ICD-10-CM | POA: Diagnosis not present

## 2024-05-23 DIAGNOSIS — I5020 Unspecified systolic (congestive) heart failure: Secondary | ICD-10-CM | POA: Diagnosis not present

## 2024-05-23 DIAGNOSIS — Z0001 Encounter for general adult medical examination with abnormal findings: Secondary | ICD-10-CM | POA: Diagnosis not present

## 2024-05-23 DIAGNOSIS — I25119 Atherosclerotic heart disease of native coronary artery with unspecified angina pectoris: Secondary | ICD-10-CM | POA: Diagnosis not present

## 2024-05-23 DIAGNOSIS — G4733 Obstructive sleep apnea (adult) (pediatric): Secondary | ICD-10-CM | POA: Diagnosis not present

## 2024-05-23 DIAGNOSIS — I10 Essential (primary) hypertension: Secondary | ICD-10-CM | POA: Diagnosis not present

## 2024-05-23 DIAGNOSIS — N1831 Chronic kidney disease, stage 3a: Secondary | ICD-10-CM | POA: Diagnosis not present

## 2024-06-05 DIAGNOSIS — E1142 Type 2 diabetes mellitus with diabetic polyneuropathy: Secondary | ICD-10-CM | POA: Diagnosis not present

## 2024-06-06 ENCOUNTER — Telehealth: Payer: Self-pay | Admitting: Cardiology

## 2024-06-06 MED ORDER — SIMVASTATIN 20 MG PO TABS: 20.0000 mg | ORAL_TABLET | Freq: Every day | ORAL | 1 refills | Status: AC

## 2024-06-06 NOTE — Telephone Encounter (Signed)
 1. Which medications need to be refilled? (please list name of each medication and dose if known) *Simvastatin  20mg   2. Which pharmacy/location (including street and city if local pharmacy) is medication to be sent to? [facility]  3. Do they need a 30 day or 90 day supply? 90

## 2024-06-06 NOTE — Telephone Encounter (Signed)
 Refills has been sent to the pharmacy.

## 2024-06-15 DIAGNOSIS — G4733 Obstructive sleep apnea (adult) (pediatric): Secondary | ICD-10-CM | POA: Diagnosis not present

## 2024-06-29 ENCOUNTER — Encounter: Payer: Self-pay | Admitting: Nurse Practitioner

## 2024-06-29 ENCOUNTER — Other Ambulatory Visit: Payer: Self-pay | Admitting: Nurse Practitioner

## 2024-06-29 ENCOUNTER — Ambulatory Visit: Admitting: Nurse Practitioner

## 2024-06-29 VITALS — BP 110/70 | HR 58 | Ht 74.0 in | Wt 214.0 lb

## 2024-06-29 DIAGNOSIS — Z7984 Long term (current) use of oral hypoglycemic drugs: Secondary | ICD-10-CM | POA: Diagnosis not present

## 2024-06-29 DIAGNOSIS — N1831 Chronic kidney disease, stage 3a: Secondary | ICD-10-CM | POA: Diagnosis not present

## 2024-06-29 DIAGNOSIS — E1122 Type 2 diabetes mellitus with diabetic chronic kidney disease: Secondary | ICD-10-CM

## 2024-06-29 DIAGNOSIS — I5020 Unspecified systolic (congestive) heart failure: Secondary | ICD-10-CM | POA: Diagnosis not present

## 2024-06-29 DIAGNOSIS — E782 Mixed hyperlipidemia: Secondary | ICD-10-CM

## 2024-06-29 DIAGNOSIS — I10 Essential (primary) hypertension: Secondary | ICD-10-CM

## 2024-06-29 LAB — POCT GLYCOSYLATED HEMOGLOBIN (HGB A1C): Hemoglobin A1C: 8.8 % — AB (ref 4.0–5.6)

## 2024-06-29 MED ORDER — METFORMIN HCL 500 MG PO TABS: 500.0000 mg | ORAL_TABLET | Freq: Every day | ORAL | 1 refills | Status: AC

## 2024-06-29 MED ORDER — ACCU-CHEK GUIDE ME W/DEVICE KIT: PACK | 0 refills | Status: AC

## 2024-06-29 MED ORDER — EMPAGLIFLOZIN 10 MG PO TABS: 10.0000 mg | ORAL_TABLET | Freq: Every day | ORAL | 3 refills | Status: AC

## 2024-06-29 MED ORDER — ACCU-CHEK GUIDE TEST VI STRP: ORAL_STRIP | 12 refills | Status: DC

## 2024-06-29 MED ORDER — ACCU-CHEK SOFTCLIX LANCETS MISC: 12 refills | Status: AC

## 2024-06-29 NOTE — Progress Notes (Signed)
 06/29/2024        Endocrinology follow-up note     Subjective:    Patient ID: [name] [name], male    DOB: Apr 29, 1951, PCP Lari Elspeth BRAVO, MD   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-grade neoplasm)   Arthritis    Coronary artery disease    NSTEMI 102014; multivessel CAD s/p post CABG (LIMA-LAD, SVG-D1, SVG-D2, SVG-OM)   Essential hypertension    GERD (gastroesophageal reflux disease)    Gout    History of kidney stones    History of peptic ulcer disease    Hyperlipidemia    Left ureteral stone    Myocardial infarction (HCC)    Nodule of right lung    CT 04-04-2017  right lower lobe multiple nodules   OSA on CPAP    Renal cyst, right    CT 04-04-2017   Renal insufficiency    Type 2 diabetes mellitus (HCC)    Wears glasses    Past Surgical History:  Procedure Laterality Date   ANAL FISTULOTOMY N/A 11/05/2023   Procedure: ANAL FISTULOTOMY;  Surgeon: Debby Hila, MD;  Location: [facility];  Service: General;  Laterality: N/A;   CARDIOVASCULAR STRESS TEST  11-13-2016   dr debera   Intermediate risk nuclear study w/ medium defect of moderate severity in the basal inferoseptal, basal inferior, mid inferoseptal and mid inferior location (findings consistant w/ prior myocardial infarction)/  mild enlarged LV cavity size;  nuclear stress EF 45%   COLONOSCOPY N/A 04/13/2017   Procedure: COLONOSCOPY;  Surgeon: Mavis Anes, MD;  [facility]: [facility];  Service: Gastroenterology;  Laterality: N/A;   COLONOSCOPY WITH PROPOFOL  N/A 04/14/2023   Procedure: COLONOSCOPY WITH PROPOFOL ;  Surgeon: Shaaron Lamar HERO, MD;  Location: [facility];  Service: Endoscopy;  Laterality: N/A;  830am, asa 3   CORONARY ARTERY BYPASS GRAFT N/A 04/25/2013   Procedure: CORONARY ARTERY BYPASS GRAFTING (CABG);  Surgeon: Dorise MARLA Fellers, MD;  Location: [facility] OR;  Service: Open Heart Surgery;  Laterality: N/A;   Coronary artery bypass graft times four, on pump, using left internal mammary artery and right greater saphenous vein via endovein harvest.   CYSTOSCOPY WITH RETROGRADE PYELOGRAM, URETEROSCOPY AND STENT PLACEMENT Left 04/08/2017   Procedure: CYSTOSCOPY WITH RETROGRADE PYELOGRAM, URETEROSCOPY,STENT PLACEMENT;  Surgeon: Carolee Sherwood JONETTA DOUGLAS, MD;  Location: [facility];  Service: Urology;  Laterality: Left;   CYSTOSCOPY/URETEROSCOPY/HOLMIUM LASER/STENT PLACEMENT Left 05/17/2017   Procedure: CYSTOSCOPY / RETROGRADE PYELOGRAM / URETEROSCOPY / HOLMIUM LASER / STONE BASKETRY / STENT EXCHANGE;  Surgeon: Carolee Sherwood JONETTA DOUGLAS, MD;  Location: [facility];  Service: Urology;  Laterality: Left;  ONLY NEEDS 45 MIN FOR PROCEDURE   ELBOW BURSA SURGERY Right 03-10-2017     at [facility]   and removal of spur   INTRAOPERATIVE TRANSESOPHAGEAL ECHOCARDIOGRAM N/A 04/25/2013   Procedure: INTRAOPERATIVE TRANSESOPHAGEAL ECHOCARDIOGRAM;  Surgeon: Dorise MARLA Fellers, MD;  Location: [facility];  Service: Open Heart Surgery;  Laterality: N/A;   KNEE ARTHROSCOPY Left 10/ 2017  approx.    dr gerome   LAPAROSCOPIC RIGHT HEMI COLECTOMY Right 04/19/2017   Procedure: OPEN RIGHT HEMI COLECTOMY;  Surgeon: Kallie Manuelita BROCKS, MD;  Location: [facility];  Service: General;  Laterality: Right;   LAPAROSCOPY N/A 04/19/2017   Procedure: LAPAROSCOPY DIAGNOSTIC;  Surgeon: Kallie Manuelita BROCKS, MD;  Location: [facility];  Service: General;  Laterality: N/A;  patient  knows to arrive at 6:15   LEFT HEART CATHETERIZATION WITH CORONARY ANGIOGRAM N/A 04/20/2013   Procedure: LEFT HEART CATHETERIZATION WITH CORONARY ANGIOGRAM;  Surgeon: Ozell JONETTA Fell, MD;  Location: [facility];  Service: Cardiovascular;  Laterality: N/A;   NASAL SEPTUM SURGERY  1990s approx   RECTAL EXAM UNDER ANESTHESIA N/A 11/05/2023   Procedure: EXAM UNDER ANESTHESIA, RECTUM;  Surgeon: Debby Hila, MD;  Location: [facility];  Service: General;  Laterality:  N/A;   TONSILLECTOMY  37 (age 19)   TRANSTHORACIC ECHOCARDIOGRAM  11-17-2016   dr debera   mild LVH,  ef 55-60%, LV wall motion & diastolic function indeterminant assessment,  images were inadequate/  mild LAE/  trivial Tr   TYMPANOSTOMY TUBE PLACEMENT     Social History   Socioeconomic History   Marital status: Married    Spouse name: Not on file   Number of children: Not on file   Years of education: Not on file   Highest education level: Not on file  Occupational History   Occupation: Drives truck    Employer: [employer]  Tobacco Use   Smoking status: Never   Smokeless tobacco: Never  Vaping Use   Vaping status: Never Used  Substance and Sexual Activity   Alcohol  use: No    Alcohol /week: 0.0 standard drinks of alcohol    Drug use: No   Sexual activity: Yes    Birth control/protection: None  Other Topics Concern   Not on file  Social History Narrative   Lives with wife, does not exercise but part of his job is strenuous. Works in a body-shop part-time, too.   2 of his mother's sisters and 1 brother died suddenly (family was told MI but no autopsy).    Social Drivers of Health   Tobacco Use: Low Risk (06/29/2024)   Patient History    Smoking Tobacco Use: Never    Smokeless Tobacco Use: Never    Passive Exposure: Not on file  Financial Resource Strain: Low Risk (05/11/2022)   Overall Financial Resource Strain (CARDIA)    Difficulty of Paying Living Expenses: Not hard at all  Food Insecurity: No Food Insecurity (03/05/2022)   Hunger Vital Sign    Worried About Running Out of Food in the Last Year: Never true    Ran Out of Food in the Last Year: Never true  Transportation Needs: No Transportation Needs (05/11/2022)   PRAPARE - Administrator, Civil Service (Medical): No    Lack of Transportation (Non-Medical): No  Physical Activity: Not on file  Stress: Not on file  Social Connections: Not on file  Depression (EYV7-0): Not on file  Alcohol   Screen: Not on file  Housing: Unknown (09/13/2023)   Received from [facility]   Epic    Unable to Pay for Housing in the Last Year: Not on file    Number of Times Moved in the Last Year: Not on file    At any time in the past 12 months, were you homeless or living in a shelter (including now)?: No  Utilities: Not on file  Health Literacy: Not on file   Outpatient Encounter Medications as of 06/29/2024  Medication Sig   Accu-Chek Softclix Lancets lancets Use as instructed to monitor glucose once daily   allopurinol (ZYLOPRIM) 300 MG tablet Take 300 mg by mouth daily.   Ascorbic Acid  (VITAMIN C ) 1000 MG tablet Take 1,000 mg by mouth daily.   aspirin  EC 81 MG  tablet Take 1 tablet (81 mg total) by mouth daily. Swallow whole.   Blood Glucose Monitoring Suppl (ACCU-CHEK GUIDE ME) w/Device KIT Use to check glucose once daily   escitalopram  (LEXAPRO ) 10 MG tablet Take 10 mg by mouth at bedtime.    ezetimibe  (ZETIA ) 10 MG tablet TAKE 1 TABLET BY MOUTH EVERY DAY   glucose blood (ACCU-CHEK GUIDE TEST) test strip Use as instructed to monitor glucose once daily   glucose blood (ONETOUCH ULTRA) test strip Use as instructed to monitor glucose once daily   Lancets (ONETOUCH ULTRASOFT) lancets Use as instructed to monitor glucose once daily   lisinopril  (ZESTRIL ) 2.5 MG tablet Take 1 tablet (2.5 mg total) by mouth daily.   Multiple Vitamin (MULTIVITAMIN WITH MINERALS) TABS tablet Take 1 tablet by mouth daily.   Omega-3 Fatty Acids (FISH OIL) 1000 MG CPDR Take 1 capsule by mouth 2 (two) times a day.    omeprazole (PRILOSEC) 20 MG capsule Take 20 mg by mouth every morning.    pyridoxine  (B-6) 200 MG tablet Take 200 mg by mouth daily.   simvastatin  (ZOCOR ) 20 MG tablet Take 1 tablet (20 mg total) by mouth at bedtime.   traMADol  (ULTRAM ) 50 MG tablet Take 1-2 tablets (50-100 mg total) by mouth every 6 (six) hours as needed.   [DISCONTINUED] Blood Glucose Monitoring Suppl (ONE TOUCH ULTRA 2)  w/Device KIT USE TO TEST BLOOD ONCE A DAY   [DISCONTINUED] empagliflozin  (JARDIANCE ) 10 MG TABS tablet Take 1 tablet (10 mg total) by mouth daily before breakfast.   [DISCONTINUED] metFORMIN  (GLUCOPHAGE ) 500 MG tablet Take 1 tablet (500 mg total) by mouth daily.   empagliflozin  (JARDIANCE ) 10 MG TABS tablet Take 1 tablet (10 mg total) by mouth daily before breakfast.   metFORMIN  (GLUCOPHAGE ) 500 MG tablet Take 1 tablet (500 mg total) by mouth daily.   No facility-administered encounter medications on file as of 06/29/2024.   ALLERGIES: No Known Allergies VACCINATION STATUS: Immunization History  Administered Date(s) Administered   Influenza Split 02/10/2014   Influenza,inj,Quad PF,6+ Mos 04/20/2017    Diabetes He presents for his follow-up diabetic visit. He has type 2 diabetes mellitus. His disease course has been fluctuating. There are no hypoglycemic associated symptoms. Pertinent negatives for hypoglycemia include no confusion, pallor or seizures. Pertinent negatives for diabetes include no fatigue, no foot ulcerations, no polydipsia, no polyphagia, no polyuria and no weakness. There are no hypoglycemic complications. Symptoms are resolved. Diabetic complications include heart disease and nephropathy. Risk factors for coronary artery disease include diabetes mellitus, dyslipidemia, hypertension, male sex and sedentary lifestyle. Current diabetic treatment includes oral agent (dual therapy). He is compliant with treatment all of the time. His weight is fluctuating minimally. He is following a generally healthy diet. When asked about meal planning, he reported none. He has not had a previous visit with a dietitian. He never participates in exercise. (He presents today with no logs or meter to review.  His POCT A1c today is 8.9%, increasing from last visit of 7.3%.  He admits he has not done the best with diet recently, also notes he had a steroid injection for bone spur between visits. ) An ACE  inhibitor/angiotensin II receptor blocker is being taken. He does not see a podiatrist.Eye exam is current.   Review of systems  Constitutional: + Minimally fluctuating body weight,  current Body mass index is 27.48 kg/m. , no fatigue, no subjective hyperthermia, no subjective hypothermia Eyes: no blurry vision, no xerophthalmia ENT: no sore throat, no  nodules palpated in throat, no dysphagia/odynophagia, no hoarseness Cardiovascular: no chest pain, no shortness of breath, no palpitations, no leg swelling Respiratory: no cough, no shortness of breath Gastrointestinal: no nausea/vomiting/diarrhea Musculoskeletal: no muscle/joint aches Skin: no rashes, no hyperemia Neurological: no tremors, no numbness, no tingling, no dizziness Psychiatric: no depression, no anxiety    Objective:    BP 110/70 (BP Location: Left Arm, Patient Position: Sitting, Cuff Size: Large)   Pulse (!) 58   Ht 6' 2 (1.88 m)   Wt 214 lb (97.1 kg)   BMI 27.48 kg/m   Wt Readings from Last 3 Encounters:  06/29/24 214 lb (97.1 kg)  04/24/24 220 lb 6.4 oz (100 kg)  02/28/24 217 lb 12.8 oz (98.8 kg)    BP Readings from Last 3 Encounters:  06/29/24 110/70  04/24/24 132/68  02/28/24 122/78    Physical Exam- Limited  Constitutional:  Body mass index is 27.48 kg/m. , not in acute distress, normal state of mind Eyes:  EOMI, no exophthalmos Musculoskeletal: no gross deformities, strength intact in all four extremities, no gross restriction of joint movements Skin:  no rashes, no hyperemia Neurological: no tremor with outstretched hands   Diabetic Foot Exam - Simple   Simple Foot Form Diabetic Foot exam was performed with the following findings: Yes 06/29/2024  8:13 AM  Visual Inspection No deformities, no ulcerations, no other skin breakdown bilaterally: Yes Sensation Testing Intact to touch and monofilament testing bilaterally: Yes Pulse Check Posterior Tibialis and Dorsalis pulse intact bilaterally:  Yes Comments Mild calluses and onychomycosis bilaterally     CMP     Component Value Date/Time   NA 136 11/03/2023 1025   NA 143 10/13/2021 0935   K 4.9 11/03/2023 1025   CL 99 11/03/2023 1025   CO2 25 11/03/2023 1025   GLUCOSE 266 (H) 11/03/2023 1025   BUN 19 11/03/2023 1025   BUN 16 10/13/2021 0935   CREATININE 1.43 (H) 11/03/2023 1025   CREATININE 1.35 (H) 09/28/2023 0952   CALCIUM  9.7 11/03/2023 1025   PROT 6.2 12/07/2023 1018   PROT 6.1 10/13/2021 0935   ALBUMIN  4.0 10/13/2021 0935   AST 23 12/07/2023 1018   ALT 20 12/07/2023 1018   ALKPHOS 99 10/13/2021 0935   BILITOT 1.4 (H) 12/07/2023 1018   BILITOT 0.8 10/13/2021 0935   GFRNONAA 53 (L) 11/03/2023 1025   GFRNONAA 52 (L) 01/23/2020 1002   GFRAA 47 (L) 07/24/2020 0932   GFRAA 60 01/23/2020 1002     Diabetic Labs (most recent): Lab Results  Component Value Date   HGBA1C 8.8 (A) 06/29/2024   HGBA1C 8.9 (A) 02/28/2024   HGBA1C 7.3 (A) 10/28/2023   MICROALBUR 80 02/16/2022   MICROALBUR 80 12/16/2020   MICROALBUR 7.3 09/09/2017     Lipid Panel ( most recent) Lipid Panel     Component Value Date/Time   CHOL 112 12/07/2023 1018   CHOL 161 11/20/2020 1017   TRIG 201 (H) 12/07/2023 1018   HDL 36 (L) 12/07/2023 1018   HDL 43 11/20/2020 1017   CHOLHDL 3.1 12/07/2023 1018   VLDL 21 07/02/2016 0853   LDLCALC 48 12/07/2023 1018     Assessment & Plan:   1) Type 2 diabetes mellitus with other circulatory complications , and acute on chronic renal insufficiency   His diabetes is complicated by coronary artery disease status post coronary artery bypass graft, acute on chronic renal insufficiency and patient remains at a high risk for more acute and chronic complications of  diabetes which include CAD, CVA, CKD, retinopathy, and neuropathy. These are all discussed in detail with the patient.    - Recent labs reviewed.   - Nutritional counseling repeated/built upon at each appointment.  - The patient admits  there is a room for improvement in their diet and drink choices. -  Suggestion is made for the patient to avoid simple carbohydrates from their diet including Cakes, Sweet Desserts / Pastries, Ice Cream, Soda (diet and regular), Sweet Tea, Candies, Chips, Cookies, Sweet Pastries, Store Bought Juices, Alcohol  in Excess of 1-2 drinks a day, Artificial Sweeteners, Coffee Creamer, and Sugar-free Products. This will help patient to have stable blood glucose profile and potentially avoid unintended weight gain.   - I encouraged the patient to switch to unprocessed or minimally processed complex starch and increased protein intake (animal or plant source), fruits, and vegetables.   - Patient is advised to stick to a routine mealtimes to eat 3 meals a day and avoid unnecessary snacks (to snack only to correct hypoglycemia).  - I have approached patient with the following individualized plan to manage diabetes and patient agrees.  -He is advised to continue his Metformin  500 mg po daily with breakfast and Jardiance  10 mg po daily.  He is determined to make lifestyle adjustments rather than change medication at this time.  -I did encourage him to continue monitoring glucose once daily 1-2 times per week to help keep him on track.   I did send in a new meter for him and also informed him on Stelo.  2) BP/HTN:  His blood pressure is controlled to target.  He is advised to continue meds as prescribed by his PCP.  3) Lipids/HPL:  His most recent lipid panel from 12/07/23 shows controlled LDL of 48 and elevated triglycerides of 201 (improving).  He is advised to continue Lovastatin  20 mg po daily at bedtime and fish oil supplement twice daily.  Encouraged him to avoid fried foods and butter.    4) Weight management:  His Body mass index is 27.48 kg/m.-a candidate for modest weight loss.  Detailed carbohydrates and exercise regimen was discussed with him.  5) Chronic Care/Health Maintenance: -Patient is on  ACE and Statin medications and encouraged to continue to follow up with Ophthalmology, Podiatrist at least yearly or according to recommendations, and advised to  stay away from smoking. I have recommended yearly flu vaccine and pneumonia vaccination at least every 5 years; moderate intensity exercise for up to 150 minutes weekly; and  sleep for at least 7 hours a day.   - I advised patient to maintain close follow up with Burdine, Steven E, MD for primary care needs.     I spent  34  minutes in the care of the patient today including review of labs from CMP, Lipids, Thyroid  Function, Hematology (current and previous including abstractions from other facilities); face-to-face time discussing  his blood glucose readings/logs, discussing hypoglycemia and hyperglycemia episodes and symptoms, medications doses, his options of short and long term treatment based on the latest standards of care / guidelines;  discussion about incorporating lifestyle medicine;  and documenting the encounter. Risk reduction counseling performed per USPSTF guidelines to reduce obesity and cardiovascular risk factors.     Please refer to Patient Instructions for Blood Glucose Monitoring and Insulin /Medications Dosing Guide  in media tab for additional information. Please  also refer to  Patient Self Inventory in the Media  tab for reviewed elements of pertinent patient history.  Jia  KATHEE [name] participated in the discussions, expressed understanding, and voiced agreement with the above plans.  All questions were answered to his satisfaction. he is encouraged to contact clinic should he have any questions or concerns prior to his return visit.   Follow up plan: -Return in about 4 months (around 10/28/2024) for Diabetes F/U with A1c in office, No previsit labs.    Benton Rio, [facility] [address] [place], [place] [address] Phone: [phone] Fax:  [phone]  06/29/2024, 8:23 AM

## 2024-09-11 ENCOUNTER — Ambulatory Visit (HOSPITAL_BASED_OUTPATIENT_CLINIC_OR_DEPARTMENT_OTHER): Admitting: Pulmonary Disease

## 2024-10-30 ENCOUNTER — Ambulatory Visit: Admitting: Nurse Practitioner
